# Patient Record
Sex: Male | Born: 1940 | Race: White | Hispanic: No | Marital: Married | Smoking: Former smoker
Health system: Southern US, Community
[De-identification: ages and names within clinical notes are randomized; demographics above are authoritative.]

## PROBLEM LIST (undated history)

## (undated) ENCOUNTER — Emergency Department (HOSPITAL_COMMUNITY): Payer: BC Managed Care – PPO | Source: Home / Self Care

## (undated) DIAGNOSIS — M549 Dorsalgia, unspecified: Secondary | ICD-10-CM

## (undated) DIAGNOSIS — M1990 Unspecified osteoarthritis, unspecified site: Secondary | ICD-10-CM

## (undated) DIAGNOSIS — H9319 Tinnitus, unspecified ear: Secondary | ICD-10-CM

## (undated) DIAGNOSIS — J9601 Acute respiratory failure with hypoxia: Secondary | ICD-10-CM

## (undated) DIAGNOSIS — Z87442 Personal history of urinary calculi: Secondary | ICD-10-CM

## (undated) DIAGNOSIS — M545 Low back pain, unspecified: Secondary | ICD-10-CM

## (undated) DIAGNOSIS — G2581 Restless legs syndrome: Secondary | ICD-10-CM

## (undated) DIAGNOSIS — M48061 Spinal stenosis, lumbar region without neurogenic claudication: Secondary | ICD-10-CM

## (undated) DIAGNOSIS — N62 Hypertrophy of breast: Secondary | ICD-10-CM

## (undated) DIAGNOSIS — I10 Essential (primary) hypertension: Secondary | ICD-10-CM

## (undated) DIAGNOSIS — E781 Pure hyperglyceridemia: Secondary | ICD-10-CM

## (undated) DIAGNOSIS — D649 Anemia, unspecified: Secondary | ICD-10-CM

## (undated) DIAGNOSIS — E1142 Type 2 diabetes mellitus with diabetic polyneuropathy: Secondary | ICD-10-CM

## (undated) DIAGNOSIS — M5416 Radiculopathy, lumbar region: Secondary | ICD-10-CM

## (undated) DIAGNOSIS — F329 Major depressive disorder, single episode, unspecified: Secondary | ICD-10-CM

## (undated) DIAGNOSIS — M5126 Other intervertebral disc displacement, lumbar region: Secondary | ICD-10-CM

## (undated) DIAGNOSIS — E785 Hyperlipidemia, unspecified: Secondary | ICD-10-CM

## (undated) DIAGNOSIS — C61 Malignant neoplasm of prostate: Secondary | ICD-10-CM

## (undated) DIAGNOSIS — G4700 Insomnia, unspecified: Secondary | ICD-10-CM

## (undated) DIAGNOSIS — E119 Type 2 diabetes mellitus without complications: Secondary | ICD-10-CM

## (undated) DIAGNOSIS — F32A Depression, unspecified: Secondary | ICD-10-CM

## (undated) HISTORY — DX: Hyperlipidemia, unspecified: E78.5

## (undated) HISTORY — DX: Malignant neoplasm of prostate: C61

## (undated) HISTORY — DX: Hypertrophy of breast: N62

## (undated) HISTORY — PX: GALLBLADDER SURGERY: SHX652

## (undated) HISTORY — PX: EYE SURGERY: SHX253

## (undated) HISTORY — DX: Spinal stenosis, lumbar region without neurogenic claudication: M48.061

## (undated) HISTORY — PX: CHOLECYSTECTOMY: SHX55

## (undated) HISTORY — PX: PROSTATE SURGERY: SHX751

## (undated) HISTORY — DX: Low back pain, unspecified: M54.50

## (undated) HISTORY — DX: Other intervertebral disc displacement, lumbar region: M51.26

## (undated) HISTORY — DX: Restless legs syndrome: G25.81

## (undated) HISTORY — DX: Essential (primary) hypertension: I10

## (undated) HISTORY — PX: MENISCUS REPAIR: SHX5179

## (undated) HISTORY — DX: Unspecified osteoarthritis, unspecified site: M19.90

## (undated) HISTORY — DX: Radiculopathy, lumbar region: M54.16

## (undated) HISTORY — PX: COLONOSCOPY: SHX174

## (undated) HISTORY — DX: Dorsalgia, unspecified: M54.9

## (undated) HISTORY — DX: Insomnia, unspecified: G47.00

## (undated) HISTORY — DX: Type 2 diabetes mellitus with diabetic polyneuropathy: E11.42

## (undated) HISTORY — DX: Tinnitus, unspecified ear: H93.19

## (undated) HISTORY — DX: Type 2 diabetes mellitus without complications: E11.9

## (undated) HISTORY — DX: Pure hyperglyceridemia: E78.1

---
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
## 1981-03-16 HISTORY — PX: HEMORRHOID SURGERY: SHX153

## 1999-12-18 ENCOUNTER — Other Ambulatory Visit: Admission: RE | Admit: 1999-12-18 | Discharge: 1999-12-18 | Payer: Self-pay | Admitting: Urology

## 2000-02-25 ENCOUNTER — Encounter: Admission: RE | Admit: 2000-02-25 | Discharge: 2000-05-25 | Payer: Self-pay | Admitting: Radiation Oncology

## 2000-03-19 ENCOUNTER — Ambulatory Visit (HOSPITAL_COMMUNITY): Admission: RE | Admit: 2000-03-19 | Discharge: 2000-03-19 | Payer: Self-pay | Admitting: Radiation Oncology

## 2000-03-19 ENCOUNTER — Encounter: Payer: Self-pay | Admitting: Radiation Oncology

## 2000-04-15 ENCOUNTER — Ambulatory Visit (HOSPITAL_BASED_OUTPATIENT_CLINIC_OR_DEPARTMENT_OTHER): Admission: RE | Admit: 2000-04-15 | Discharge: 2000-04-15 | Payer: Self-pay | Admitting: Urology

## 2000-04-15 ENCOUNTER — Encounter: Payer: Self-pay | Admitting: Urology

## 2000-04-16 ENCOUNTER — Encounter: Admission: RE | Admit: 2000-04-16 | Discharge: 2000-07-15 | Payer: Self-pay | Admitting: Radiation Oncology

## 2001-03-16 DIAGNOSIS — C61 Malignant neoplasm of prostate: Secondary | ICD-10-CM

## 2001-03-16 HISTORY — DX: Malignant neoplasm of prostate: C61

## 2003-06-04 ENCOUNTER — Encounter: Admission: RE | Admit: 2003-06-04 | Discharge: 2003-06-04 | Payer: Self-pay | Admitting: Internal Medicine

## 2003-06-06 ENCOUNTER — Encounter: Admission: RE | Admit: 2003-06-06 | Discharge: 2003-06-06 | Payer: Self-pay | Admitting: Internal Medicine

## 2003-06-06 IMAGING — CR DG CHEST 2V
2 series · 2 of 2 positions shown · non-contrast
Comparison: none

CLINICAL DATA: Patient has cough.  Gynecomastia.  
 CHEST, TWO VIEWS
 PA and lateral views reveal the heart size to be normal with calcification of the aortic arch.  There is thought to be an area of scarring in the left costophrenic angle.  No active lung findings.  Degenerative changes are noted of the lower thoracic spine. 
 IMPRESSION
 No active disease.

[view not recorded (1 of 2)]
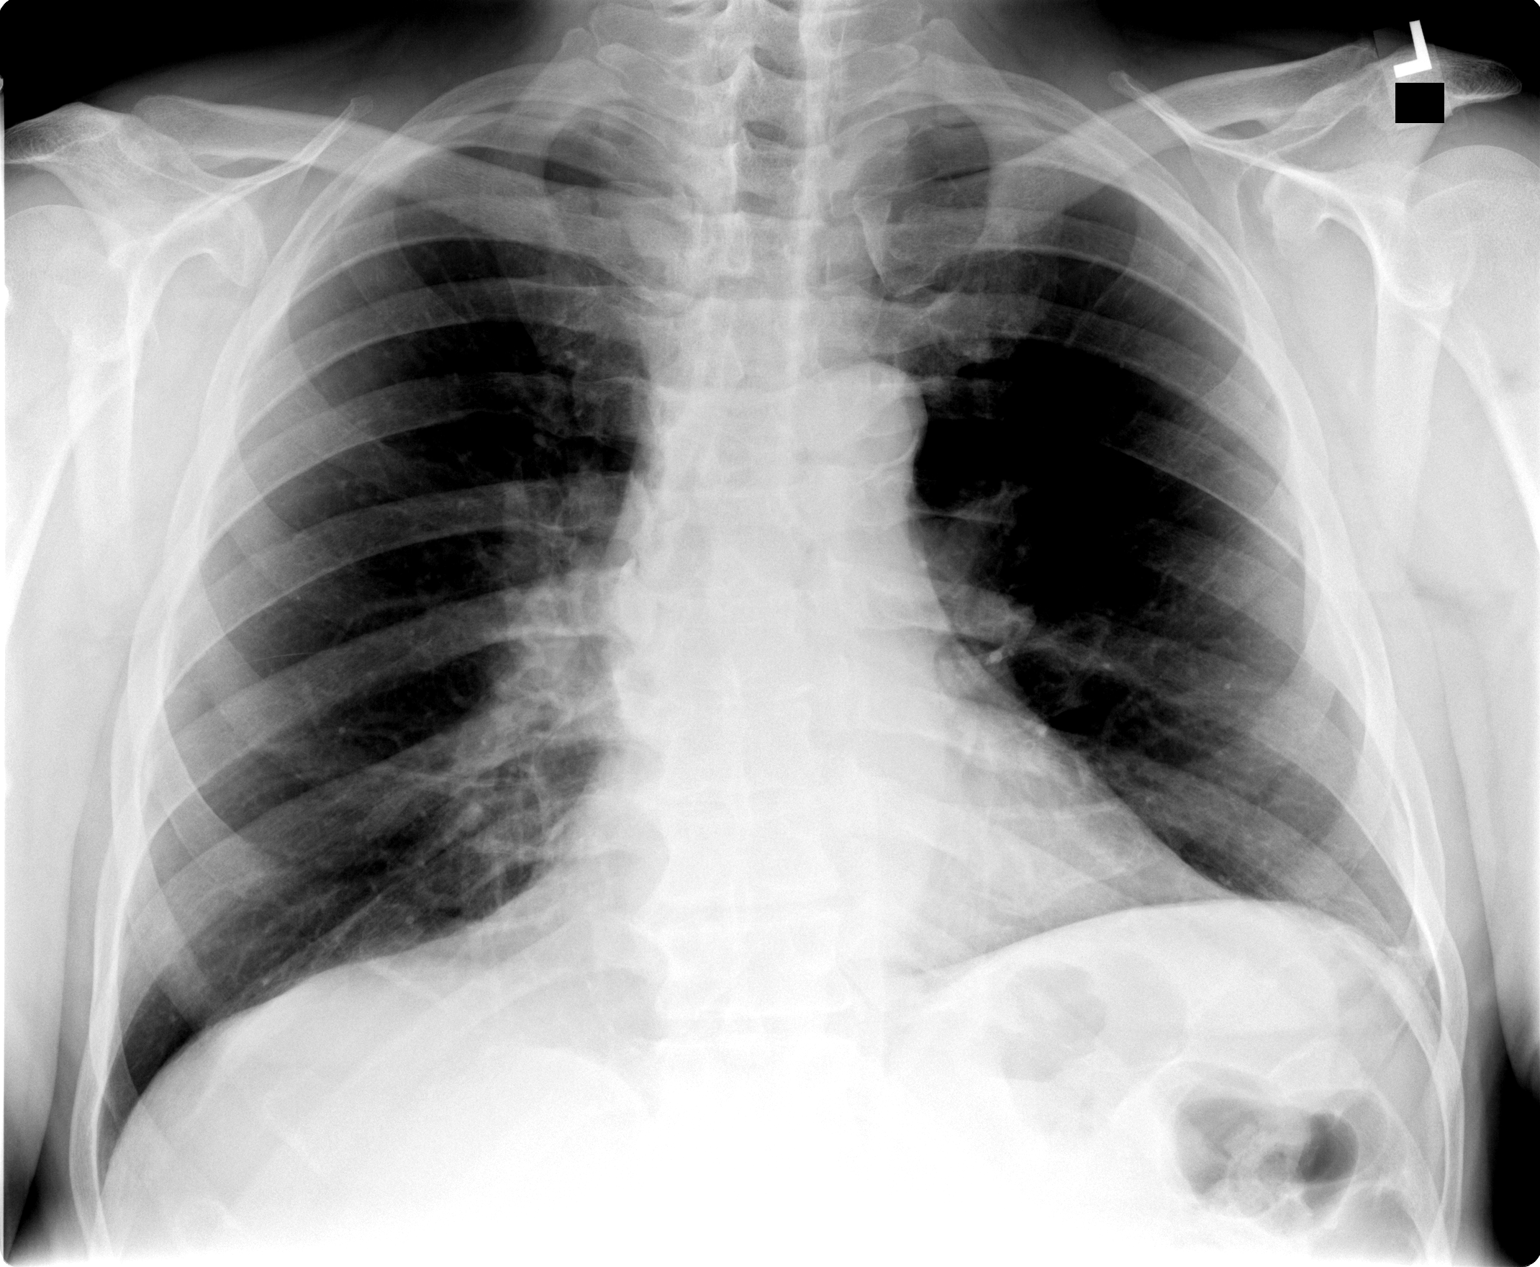

[view not recorded (2 of 2)]
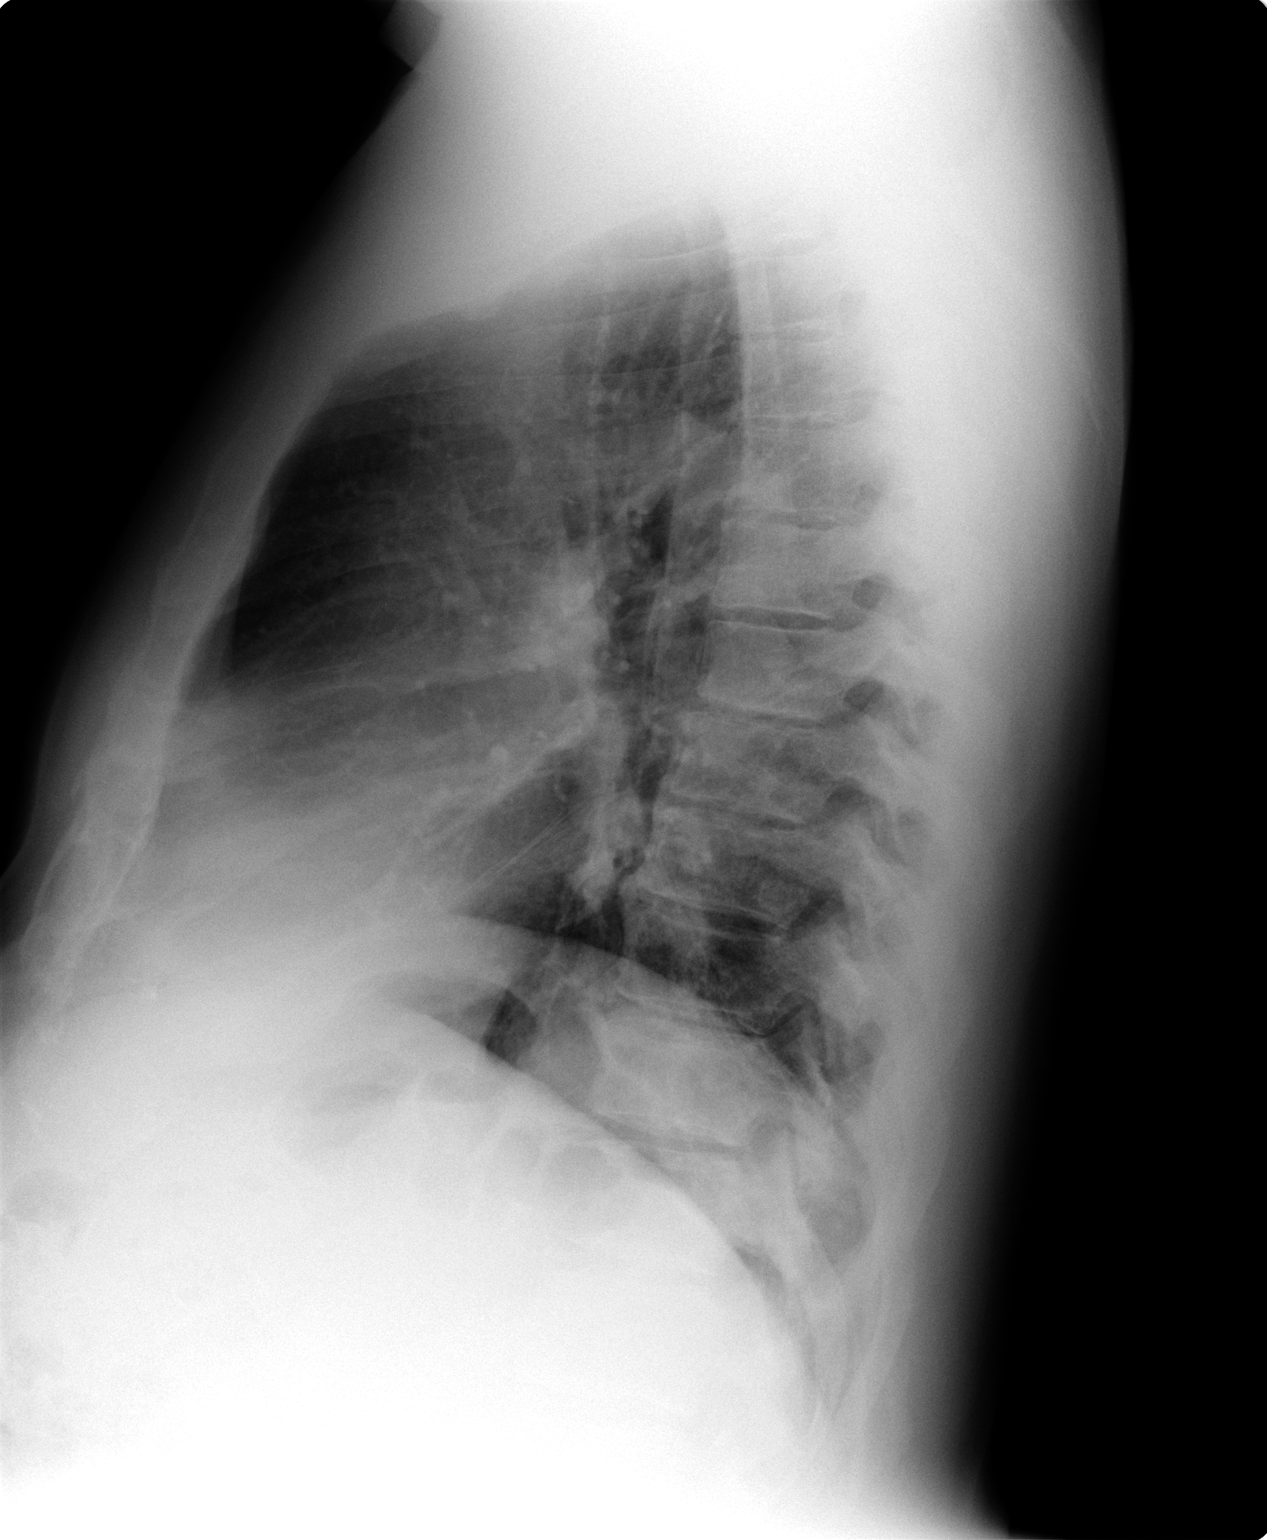

[2 of 2 positions shown; findings below may reference images not displayed]

## 2005-05-21 ENCOUNTER — Encounter: Admission: RE | Admit: 2005-05-21 | Discharge: 2005-05-21 | Payer: Self-pay | Admitting: Internal Medicine

## 2005-05-21 IMAGING — US US EXTREM LOW VENOUS*R*
1 series · 14 of 24 positions shown · non-contrast
Comparison: None.

CLINICAL DATA: Right lower extremity pain and swelling. 
 RIGHT LOWER EXTREMITY VENOUS DOPPLER ULTRASOUND:
TECHNIQUE: Gray-scale sonography with compression, as well as color and duplex Doppler ultrasound, were performed to evaluate the deep venous system from the level of the common femoral vein through the popliteal and proximal calf veins.

[Series 1: unknown · 14 of 28 slices shown]
[im 1/28]
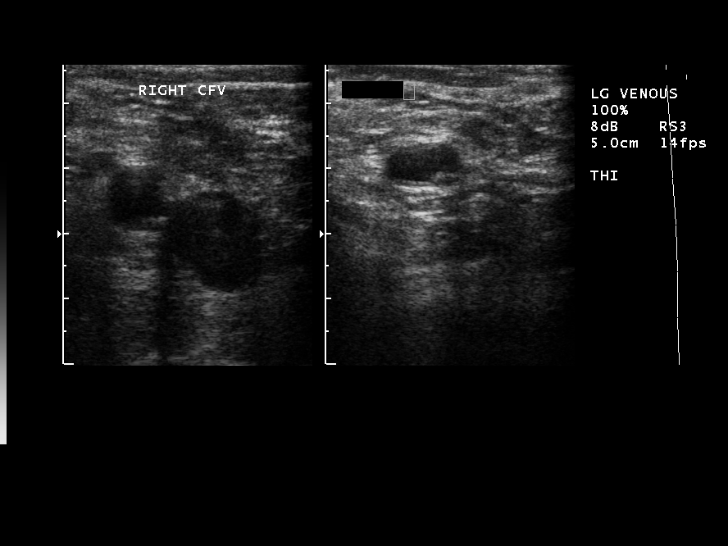
[im 3/28]
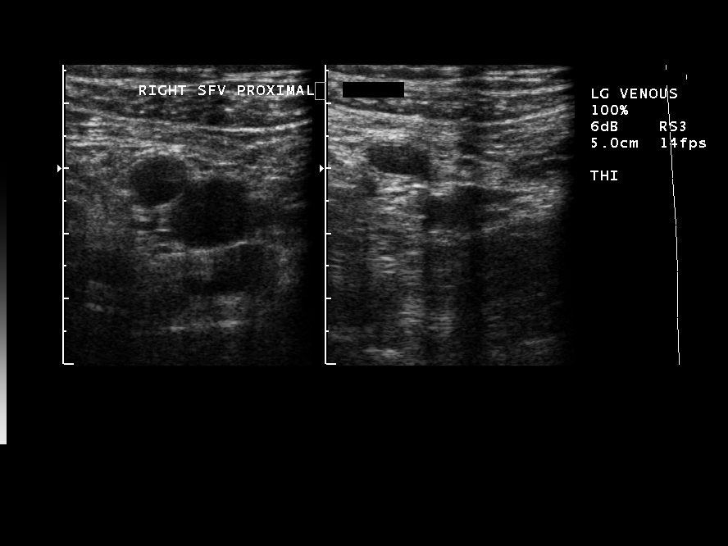
[im 5/28]
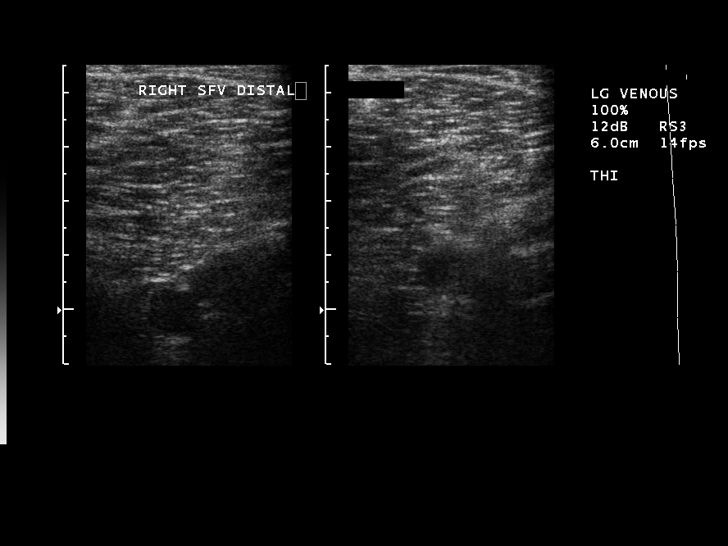
[im 8/28]
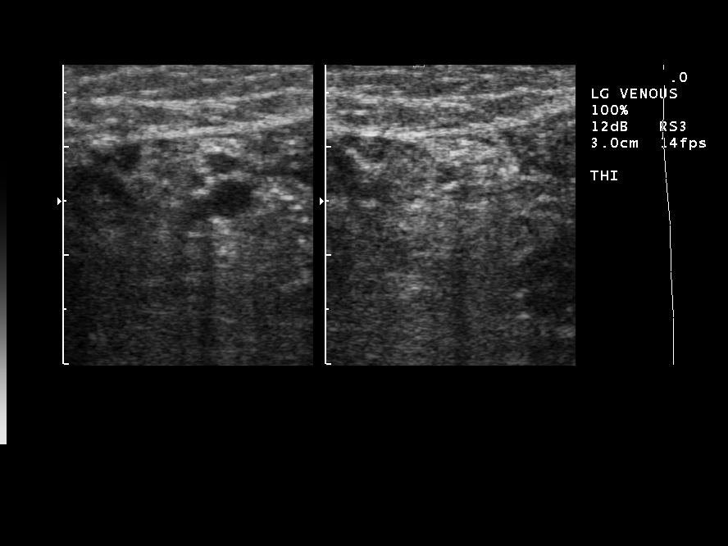
[im 9/28]
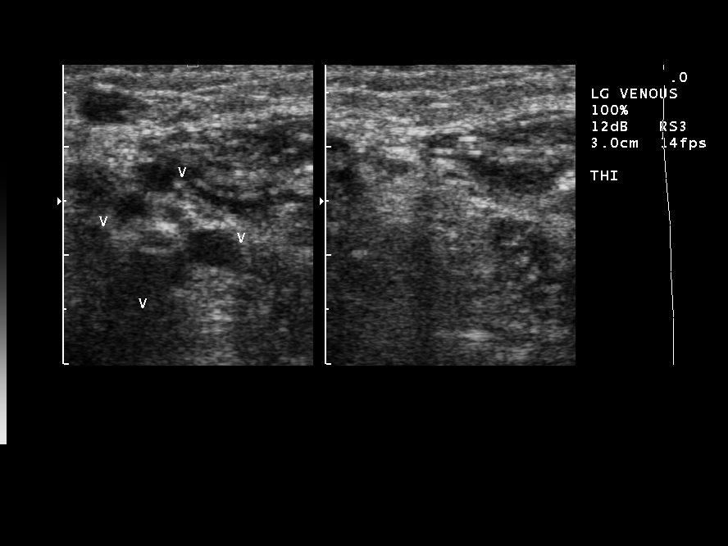
[im 11/28]
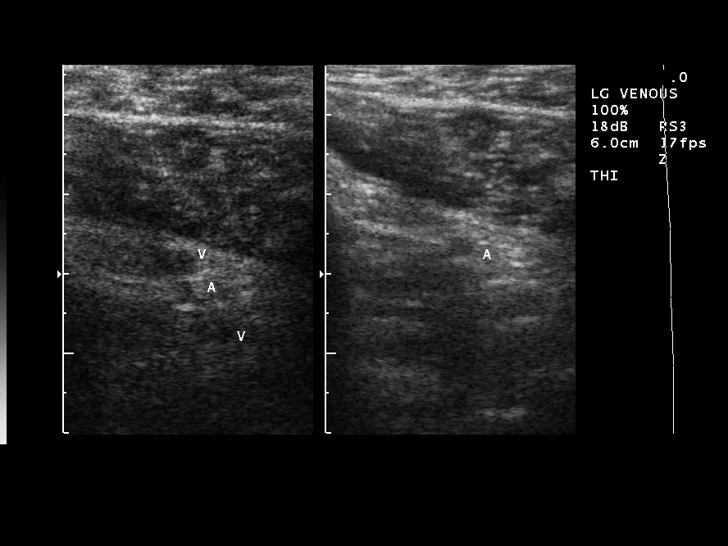
[im 13/28]
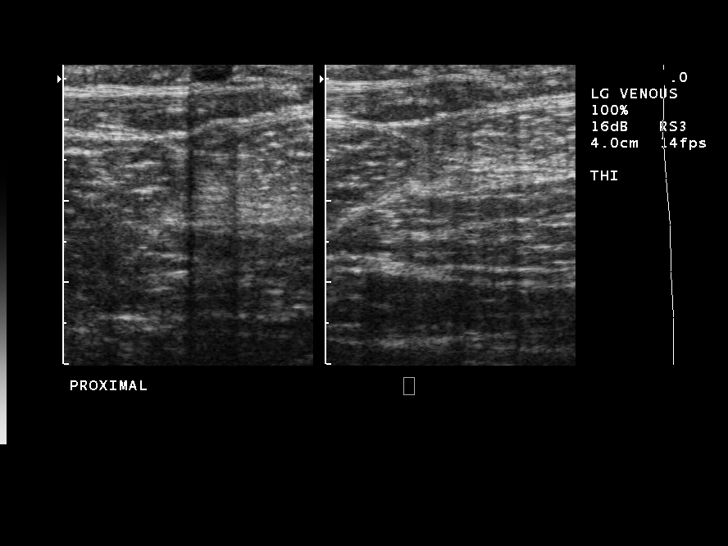
[im 15/28]
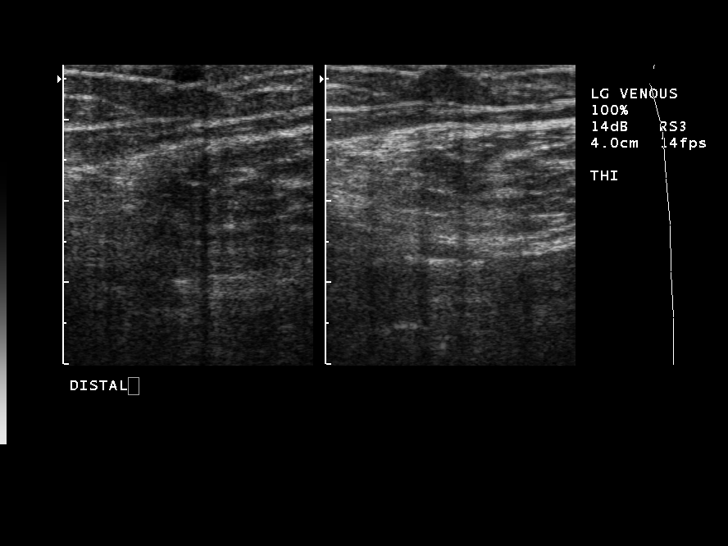
[im 17/28]
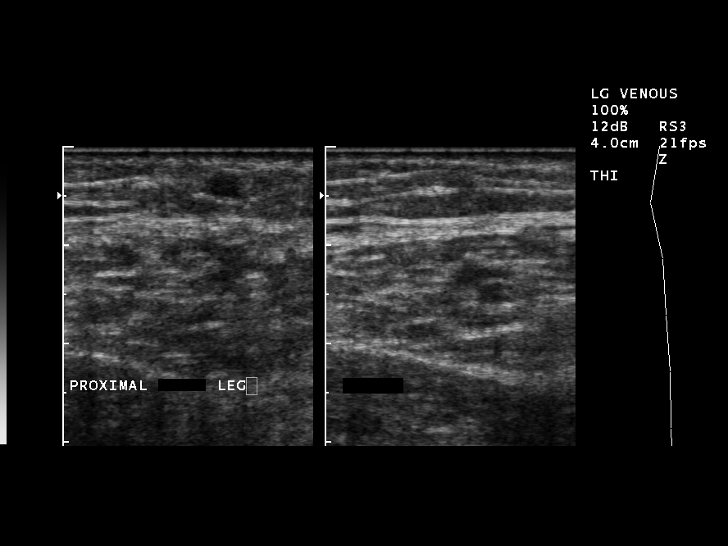
[im 19/28]
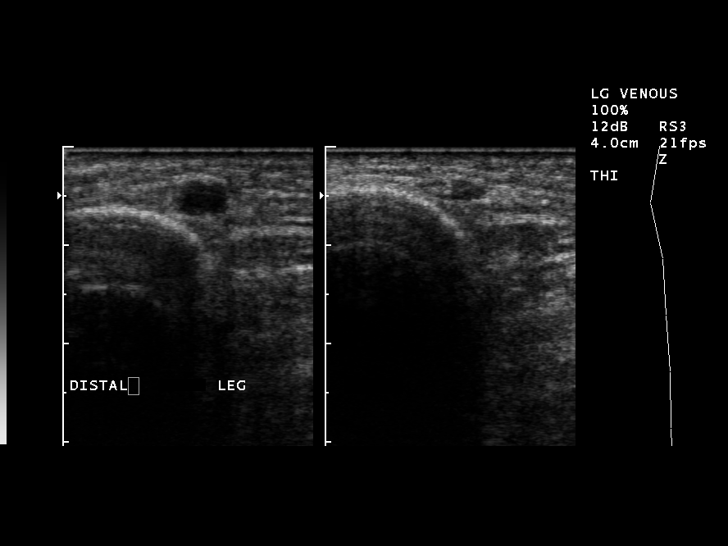
[im 22/28]
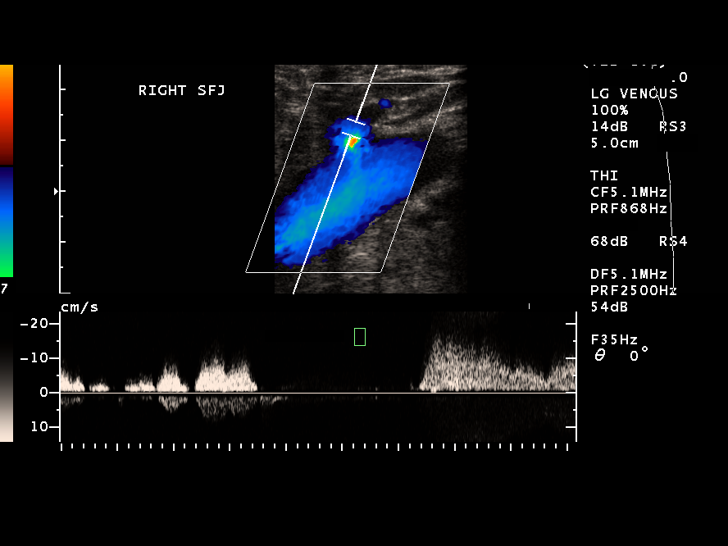
[im 23/28]
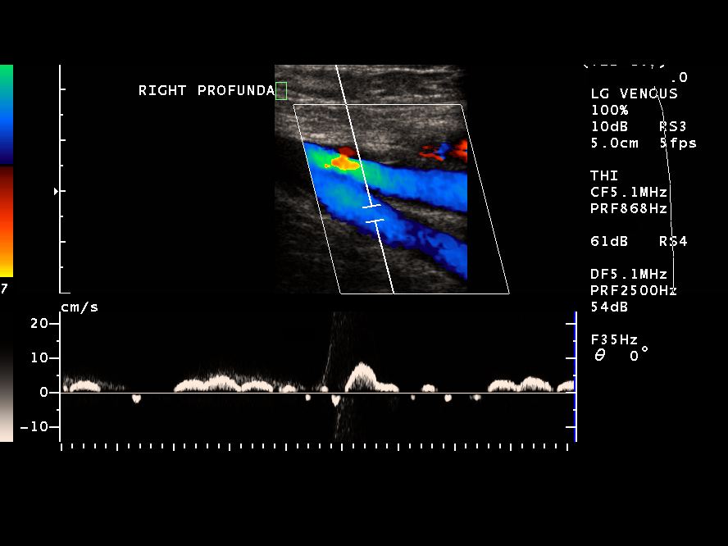
[im 25/28]
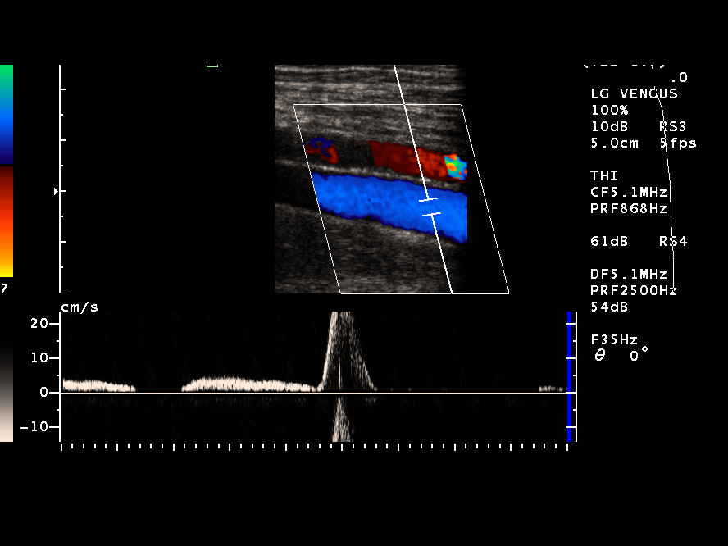
[im 28/28]
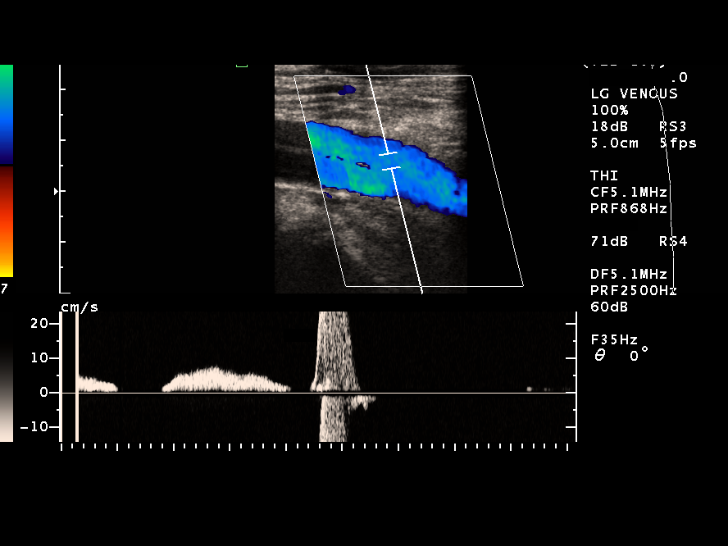

[14 of 24 positions shown; findings below may reference images not displayed]

FINDINGS: There is patent flow in the common femoral, superficial femoral, and popliteal veins.  These vessels are completely compressible and demonstrate increased flow with augmentation.  No superficial thrombophlebitis is seen.   Posterior tibial veins are identified in the proximal calf and are compressible.
IMPRESSION: No evidence for deep venous thrombosis in the right lower extremity.

## 2005-08-30 ENCOUNTER — Ambulatory Visit (HOSPITAL_COMMUNITY): Admission: RE | Admit: 2005-08-30 | Discharge: 2005-08-30 | Payer: Self-pay | Admitting: Family Medicine

## 2005-08-30 IMAGING — US US ABDOMEN COMPLETE
1 series · 13 of 25 positions shown · non-contrast
Comparison: none

CLINICAL DATA: Abdominal pain. 
 ABDOMEN ULTRASOUND:
TECHNIQUE: Complete abdominal ultrasound examination was performed including evaluation of the liver, gallbladder, bile ducts, pancreas, kidneys, spleen, IVC, and abdominal aorta.

[Series 1: abdomen · 0.33mm/px · 13 of 86 slices shown]
[im 1/86]
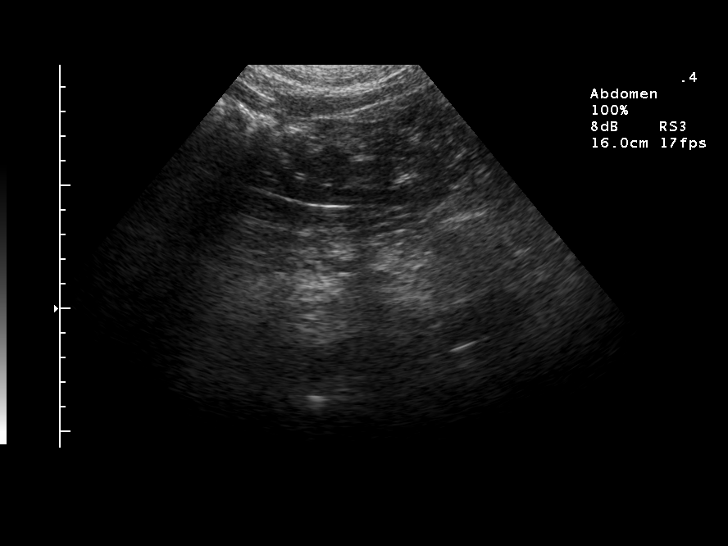
[im 8/86]
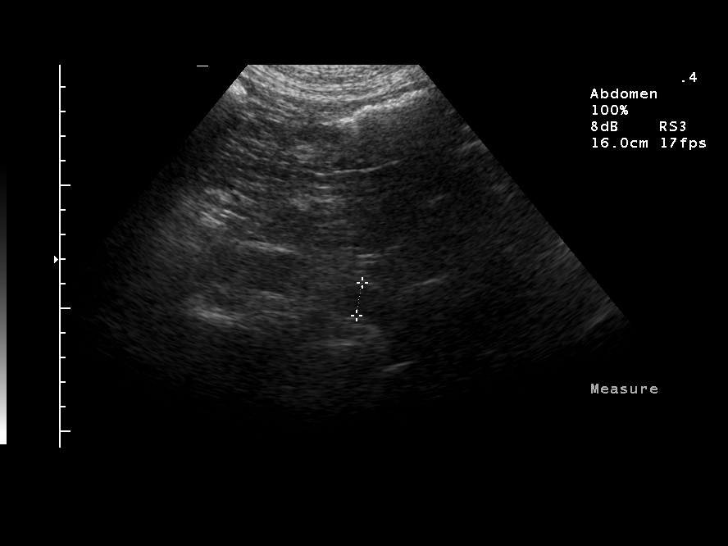
[im 15/86]
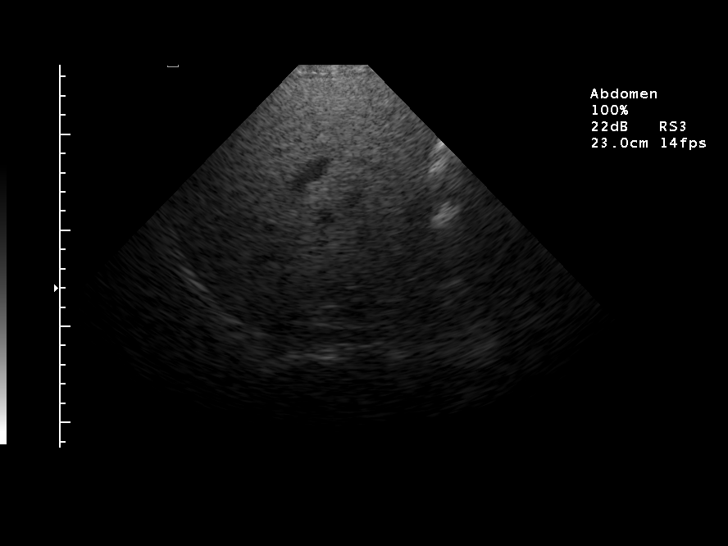
[im 22/86]
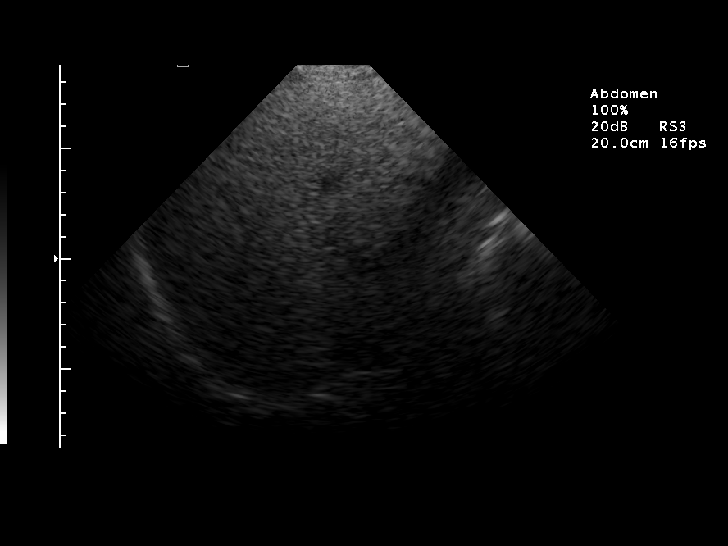
[im 29/86]
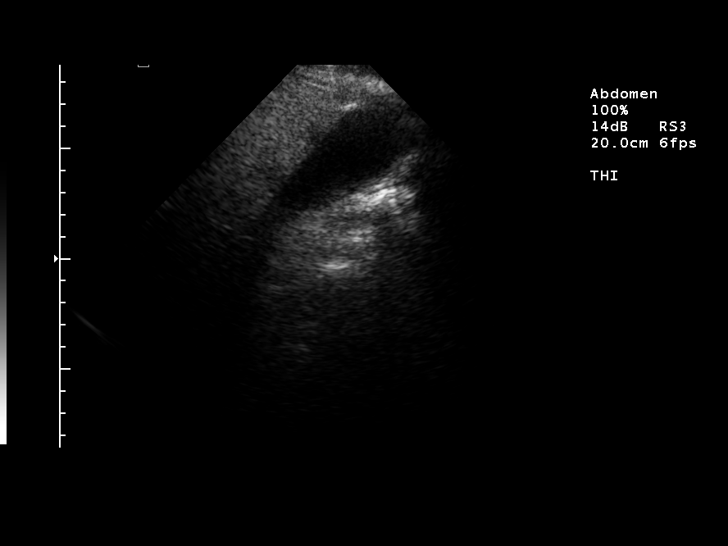
[im 36/86]
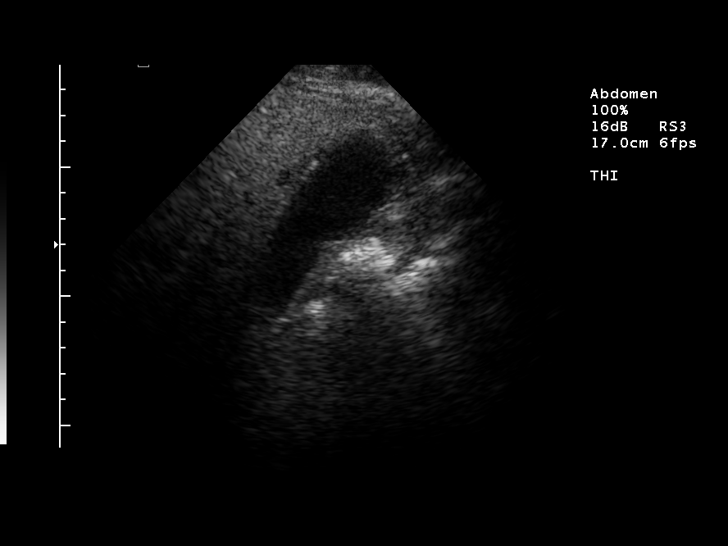
[im 43/86]
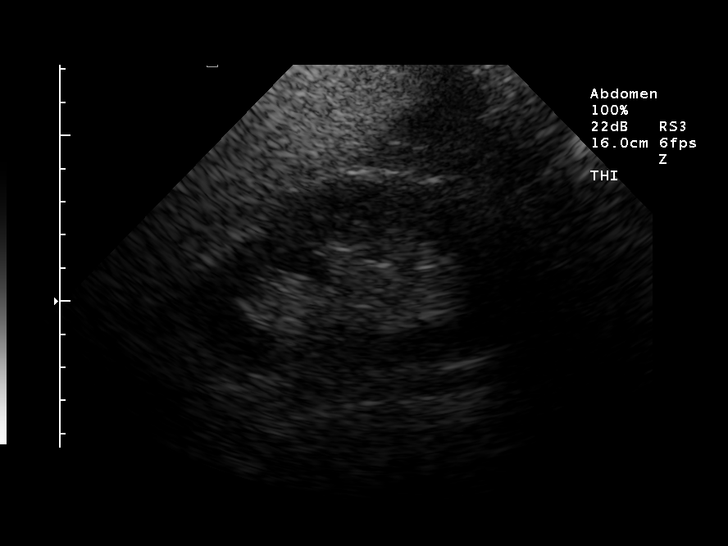
[im 50/86]
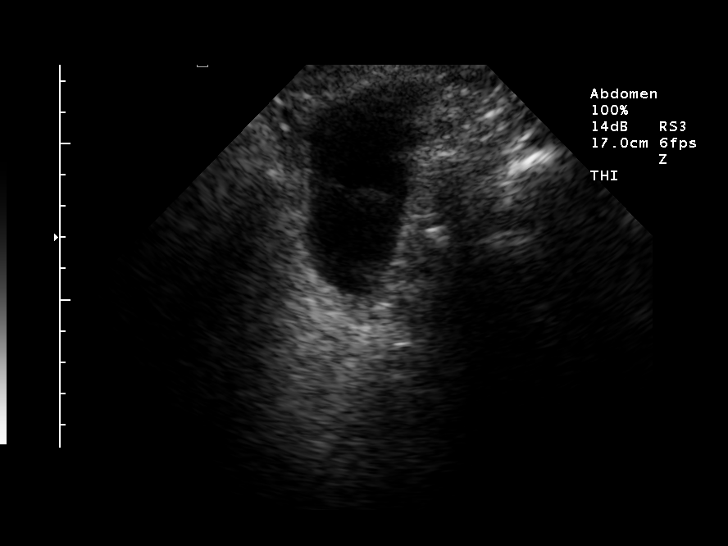
[im 57/86]
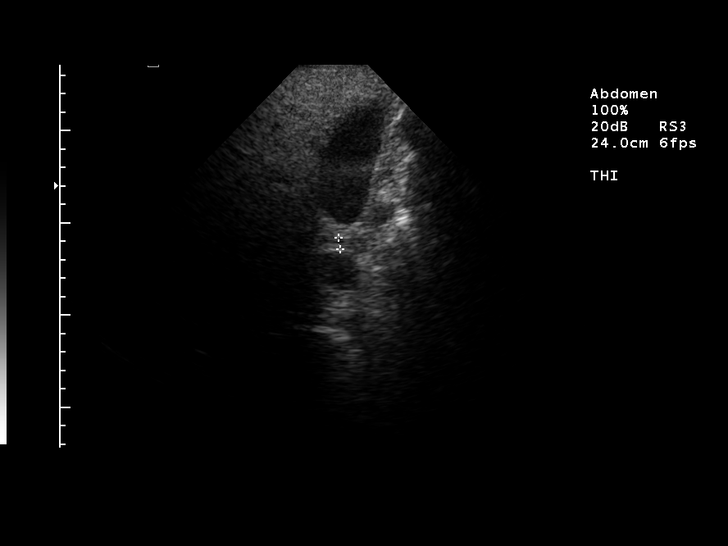
[im 64/86]
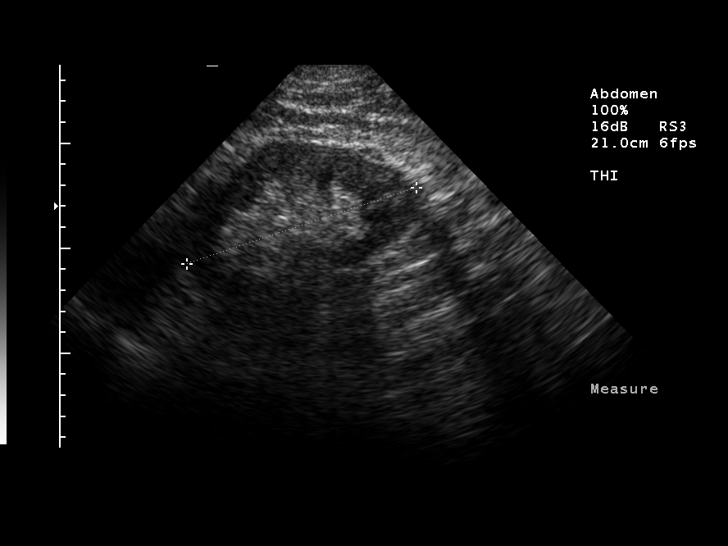
[im 71/86]
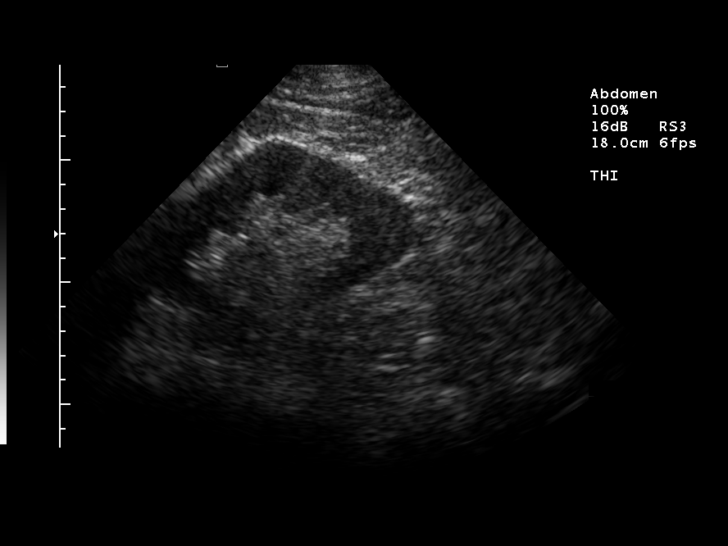
[im 78/86]
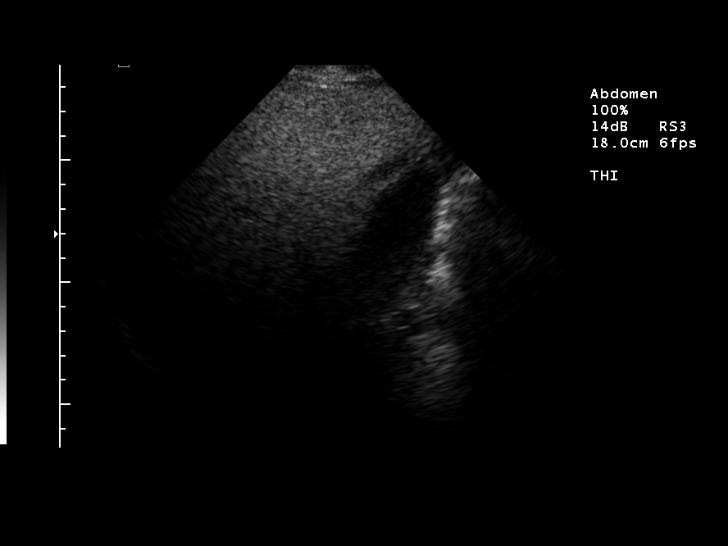
[im 86/86]
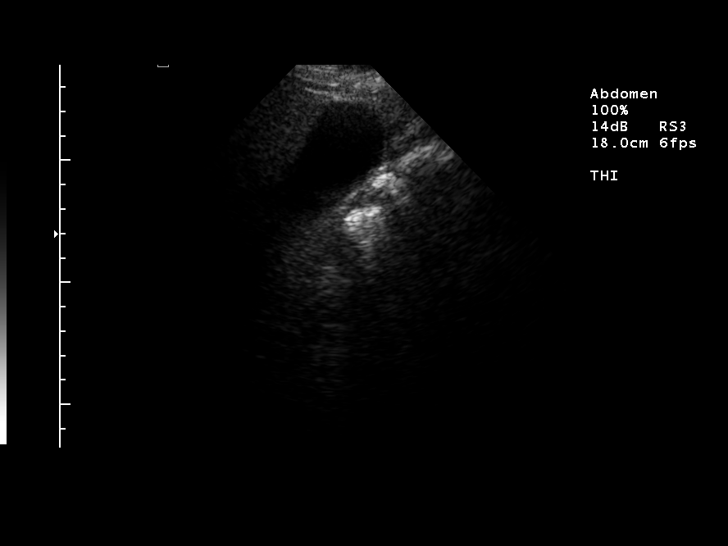

[13 of 25 positions shown; findings below may reference images not displayed]

FINDINGS: Diffusely hyperechoic echotexture of the liver is seen, consistent with diffuse fatty infiltration. This limits evaluation.  No focal liver lesions are identified.  The gallbladder is incompletely distended.  Some layering echogenic sludge is seen within the gallbladder but no discrete gallstones are identified.  There is no evidence of abnormal gallbladder wall thickening or pericholecystic fluid.  Focal fatty sparring of the hepatic parenchyma is seen immediately adjacent to the gallbladder.
 There is no definite evidence of biliary ductal dilatation with the common bile duct measuring approximately 6 mm, although evaluation is limited due to diffuse fatty infiltration.  The IVC and pancreas were not well visualized due to overlying bowel gas.
 There is no evidence of splenomegaly.  Both kidneys are normal in size and appearance and there is no evidence of hydronephrosis.  The visualized portion of the abdominal aorta is nondilated. There is no evidence of ascites.
IMPRESSION: 1.  Gallbladder sludge is noted, but no discrete gallstones are identified.
 2.  Marked diffuse fatty infiltration of the liver which somewhat limits evaluation. No definite evidence of biliary dilatation or other acute findings.

## 2005-09-21 ENCOUNTER — Ambulatory Visit (HOSPITAL_COMMUNITY): Admission: RE | Admit: 2005-09-21 | Discharge: 2005-09-21 | Payer: Self-pay | Admitting: General Surgery

## 2005-09-21 IMAGING — CR DG CHEST 2V
3 series · 3 of 3 positions shown · non-contrast
Comparison: [DATE].

CLINICAL DATA: Preop for gallbladder surgery.  The patient has a history of smoking. 
 CHEST ? 2 VIEW:

[view not recorded (1 of 3)]
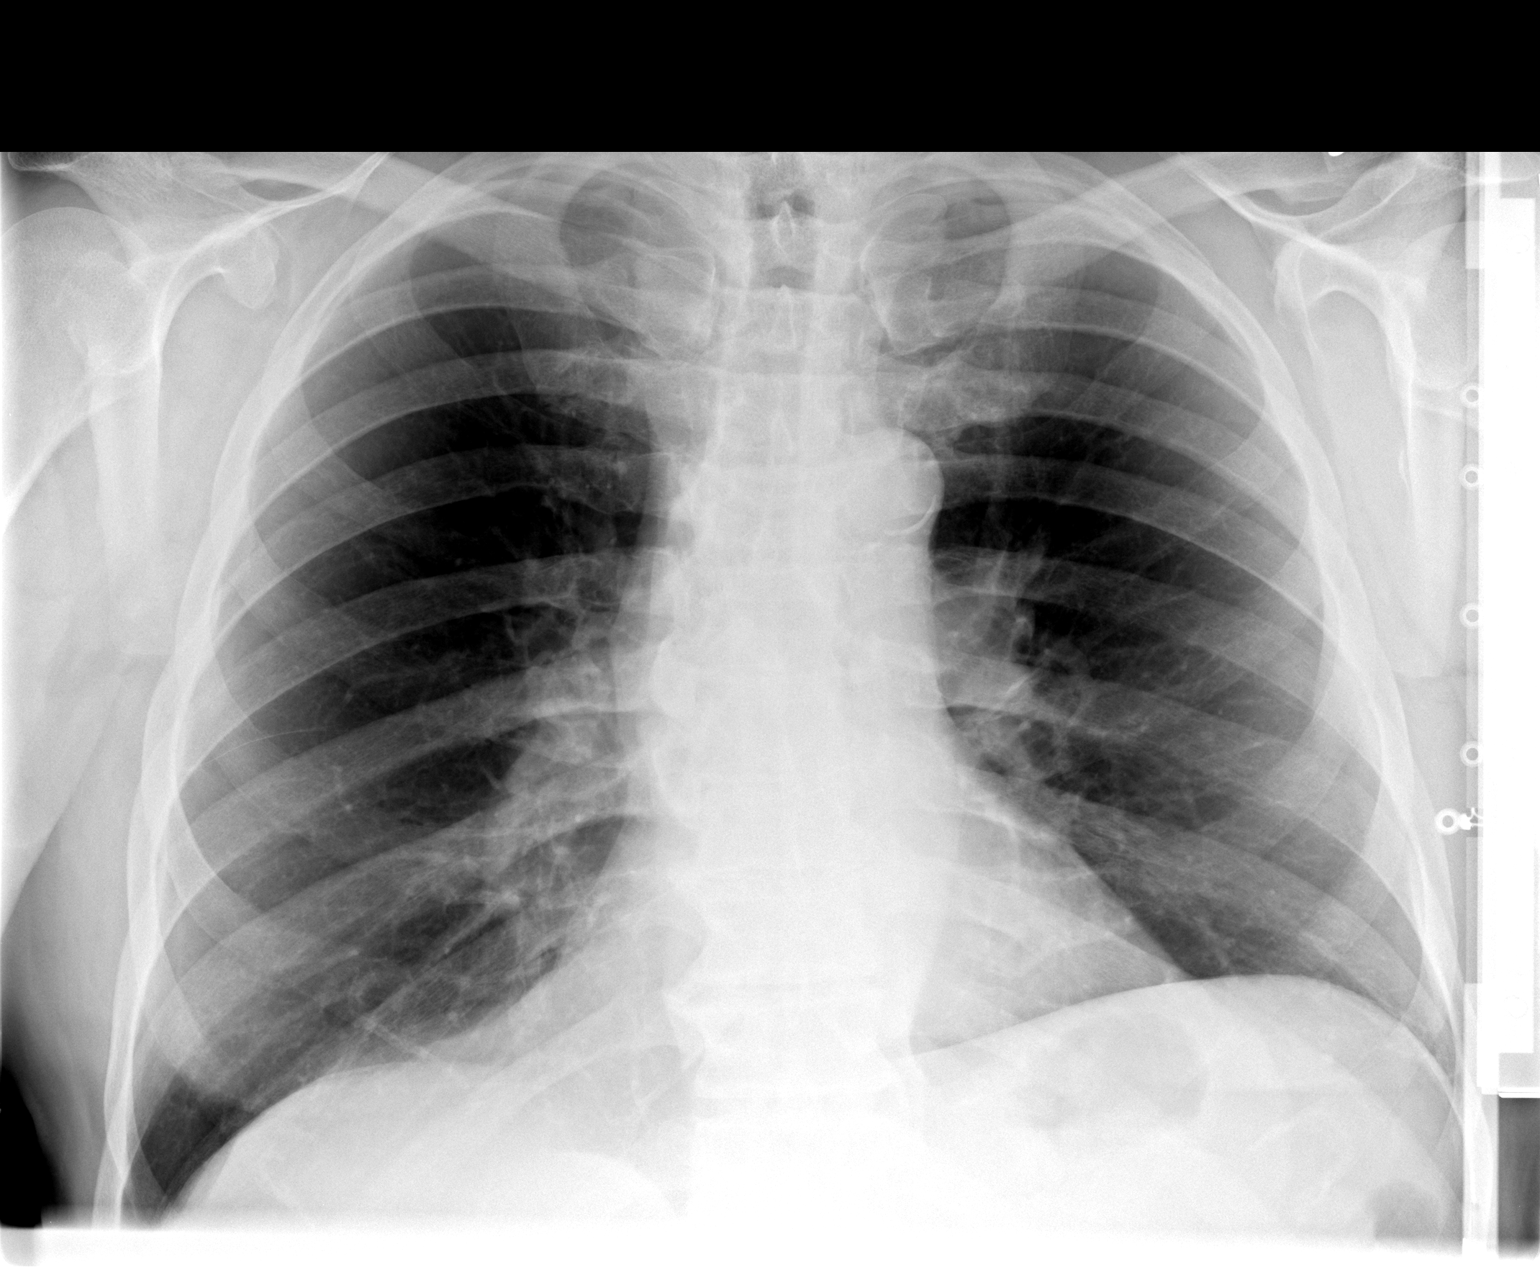

[view not recorded (2 of 3)]
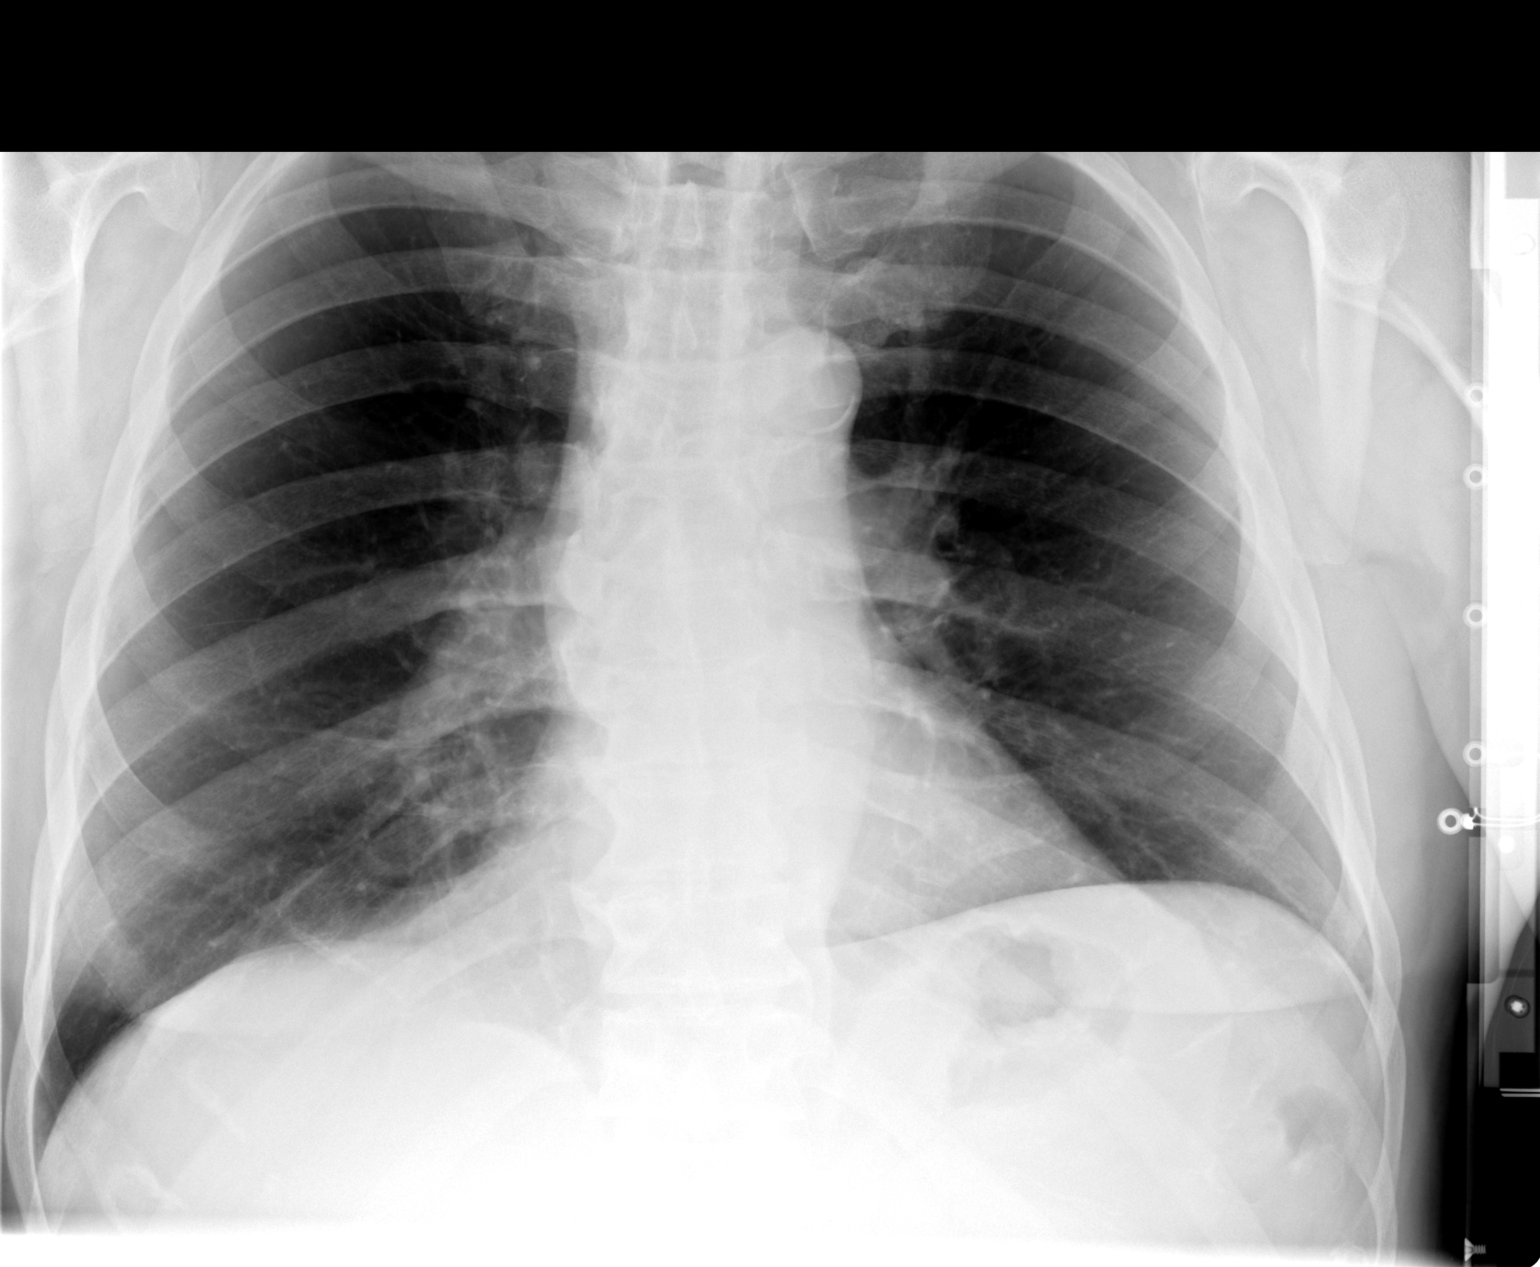

[view not recorded (3 of 3)]
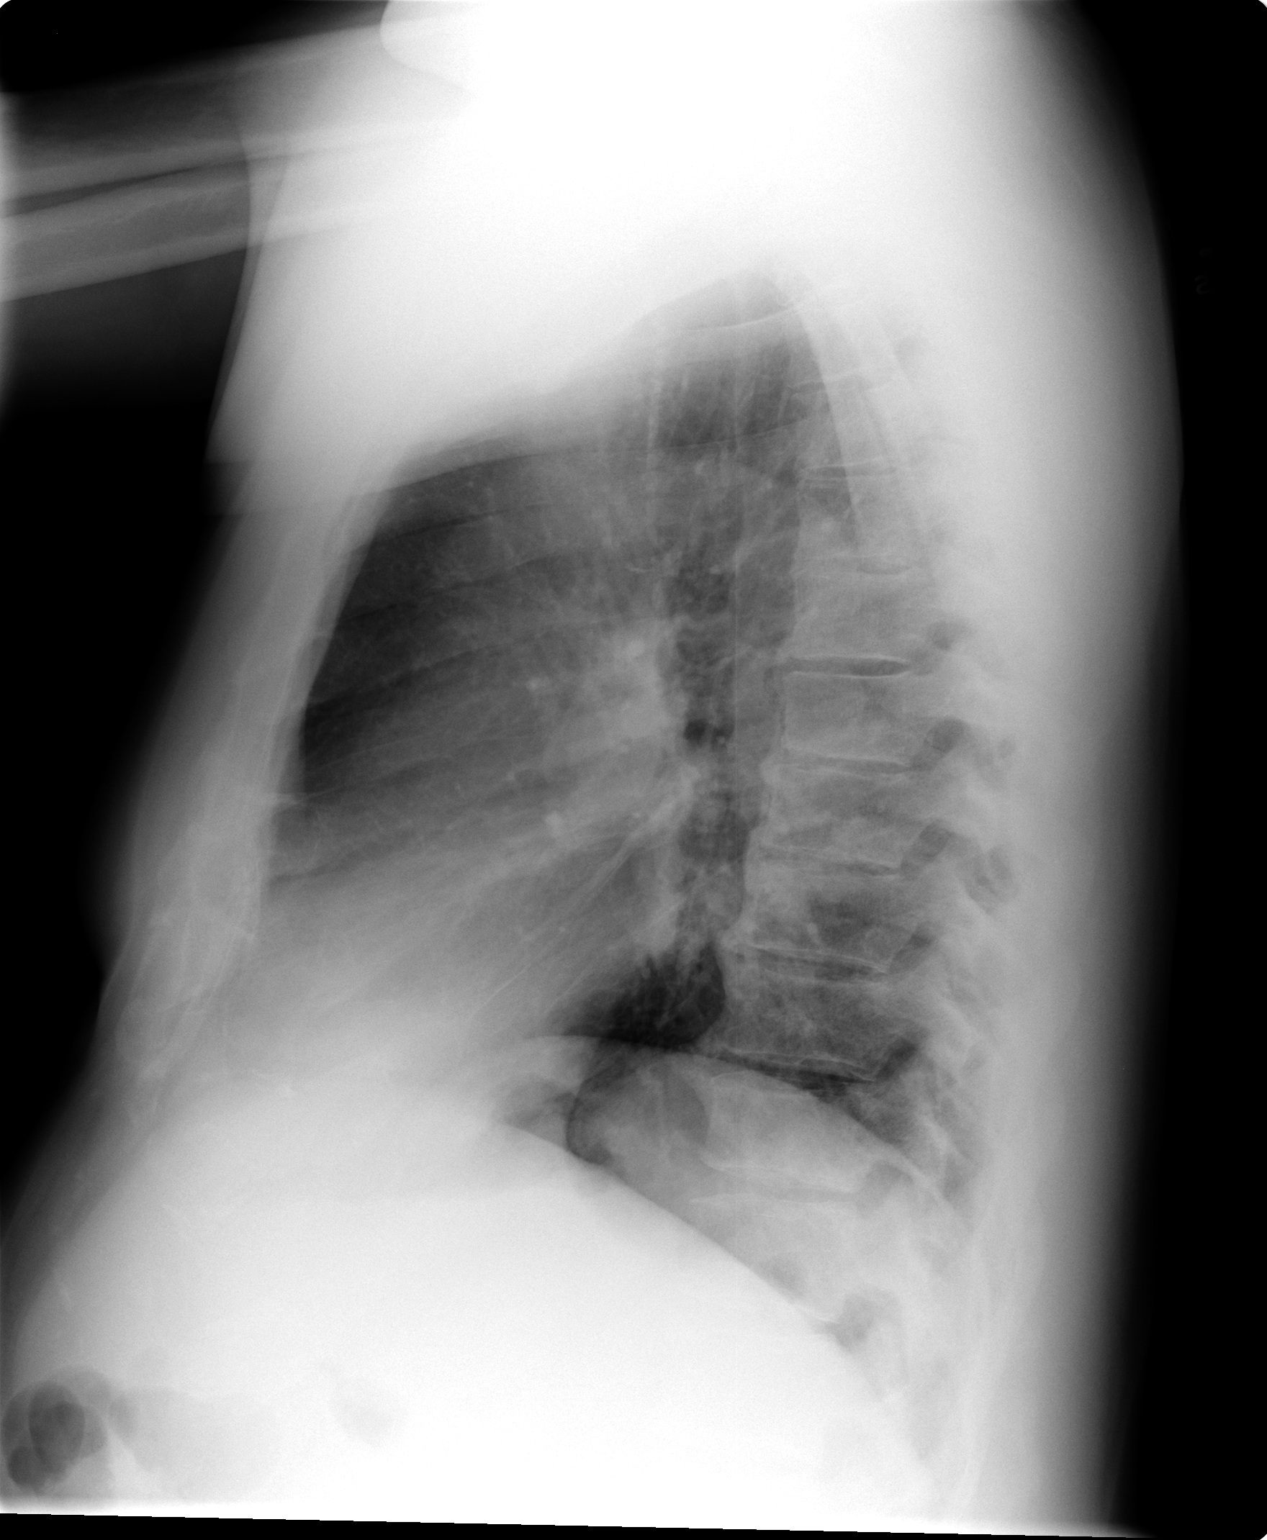

[3 of 3 positions shown; findings below may reference images not displayed]

FINDINGS: PA and lateral views reveal the heart size to be normal.  The aorta is mildly dilated with calcification.  Diffuse spurring is again noted of the thoracic spine.
IMPRESSION: No change.  No active disease.

## 2005-09-21 IMAGING — RF DG CHOLANGIOGRAM OPERATIVE
1 series · 4 of 4 positions shown · non-contrast
Comparison: none

CLINICAL DATA: Cholecystectomy.
 OPERATIVE CHOLANGIOGRAM:

[Series 1: run · 4 of 74 frames shown]
[frame 12/74]
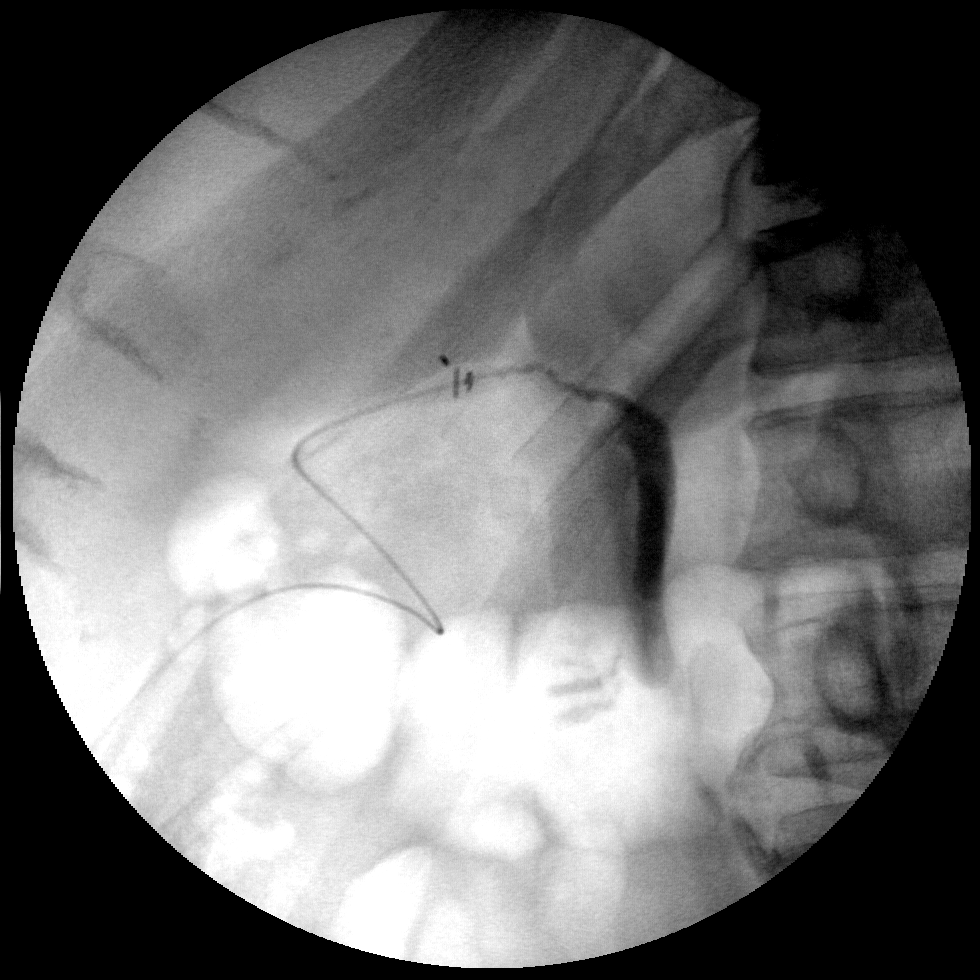
[frame 38/74]
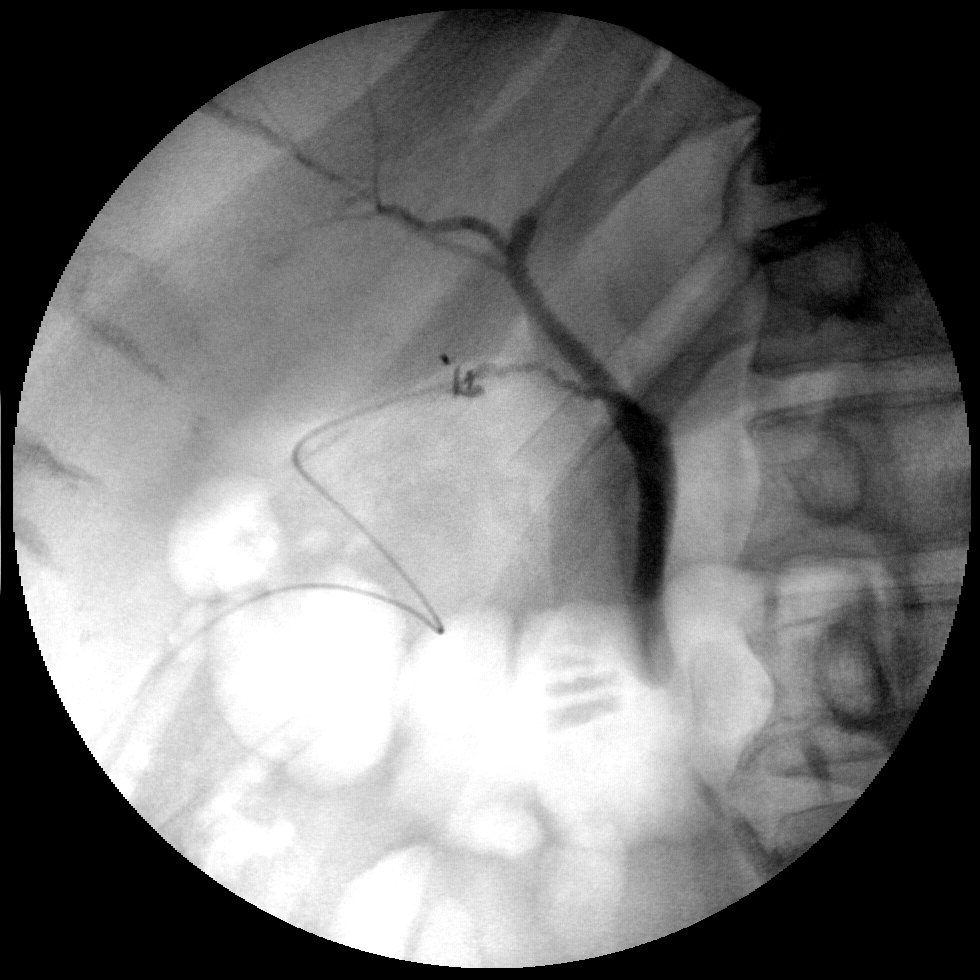
[frame 63/74]
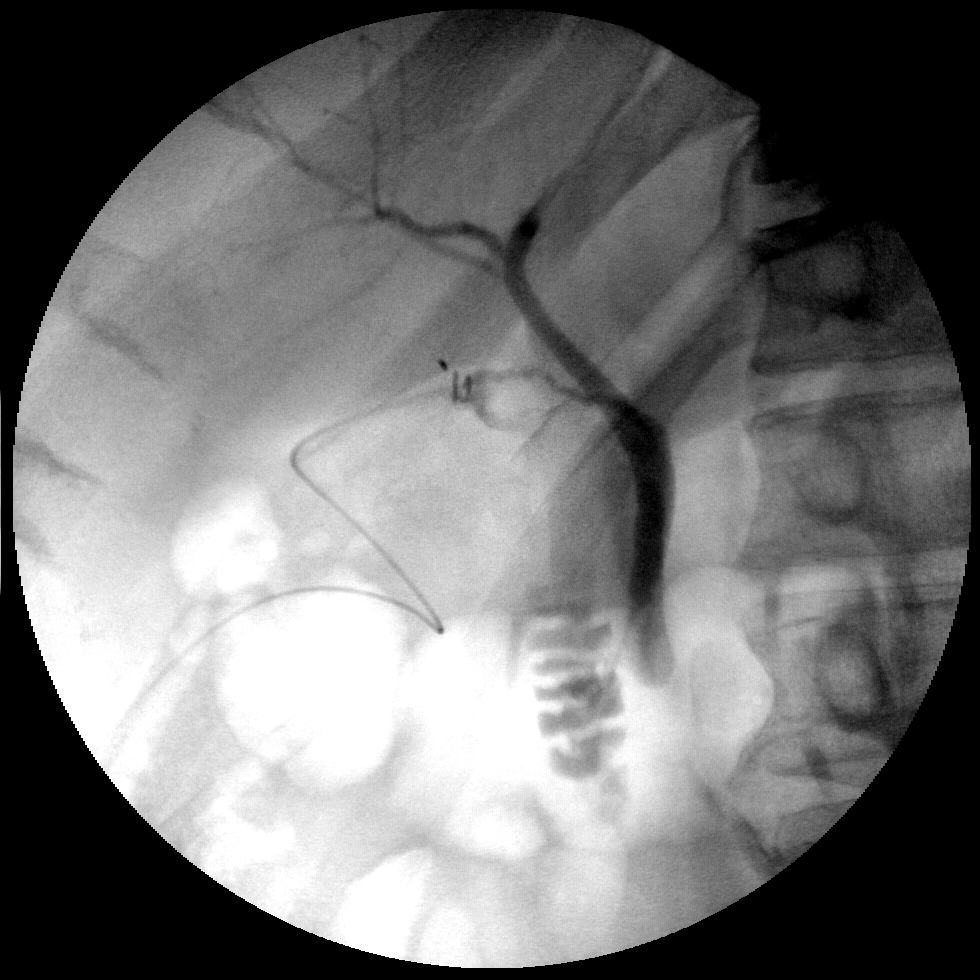
[frame 74/74]
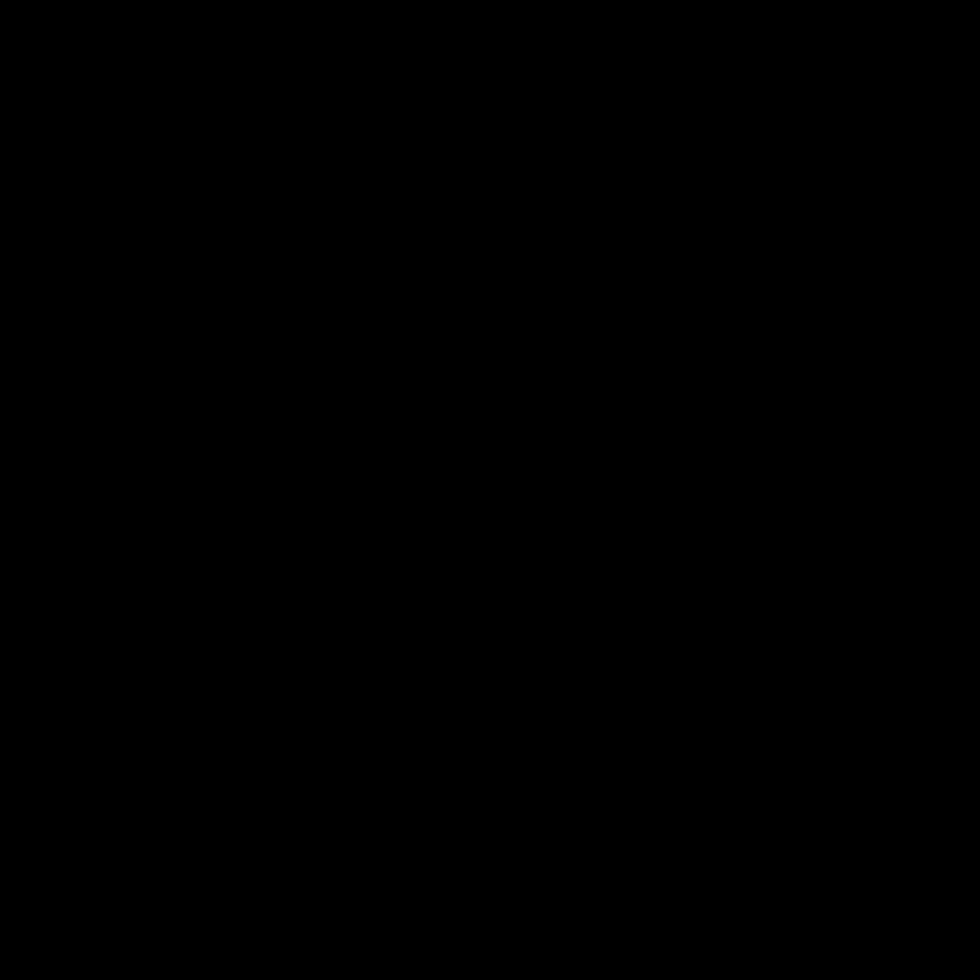

[4 of 4 positions shown; findings below may reference images not displayed]

FINDINGS: Cystic duct is injected with contrast.  The bile ducts are nondilated.  There is no obstruction of the common bile duct.  There is no leak or stone.
IMPRESSION: Negative.

## 2006-02-19 ENCOUNTER — Ambulatory Visit (HOSPITAL_COMMUNITY): Admission: RE | Admit: 2006-02-19 | Discharge: 2006-02-19 | Payer: Self-pay | Admitting: Gastroenterology

## 2006-04-02 ENCOUNTER — Encounter: Admission: RE | Admit: 2006-04-02 | Discharge: 2006-04-02 | Payer: Self-pay | Admitting: *Deleted

## 2006-04-02 IMAGING — CR DG CHEST 2V
2 series · 2 of 2 positions shown · non-contrast
Comparison: [DATE].

CLINICAL DATA: Hypertension and bronchial breath sounds at right lung base. 
 TWO VIEW CHEST:

[w chest pa]
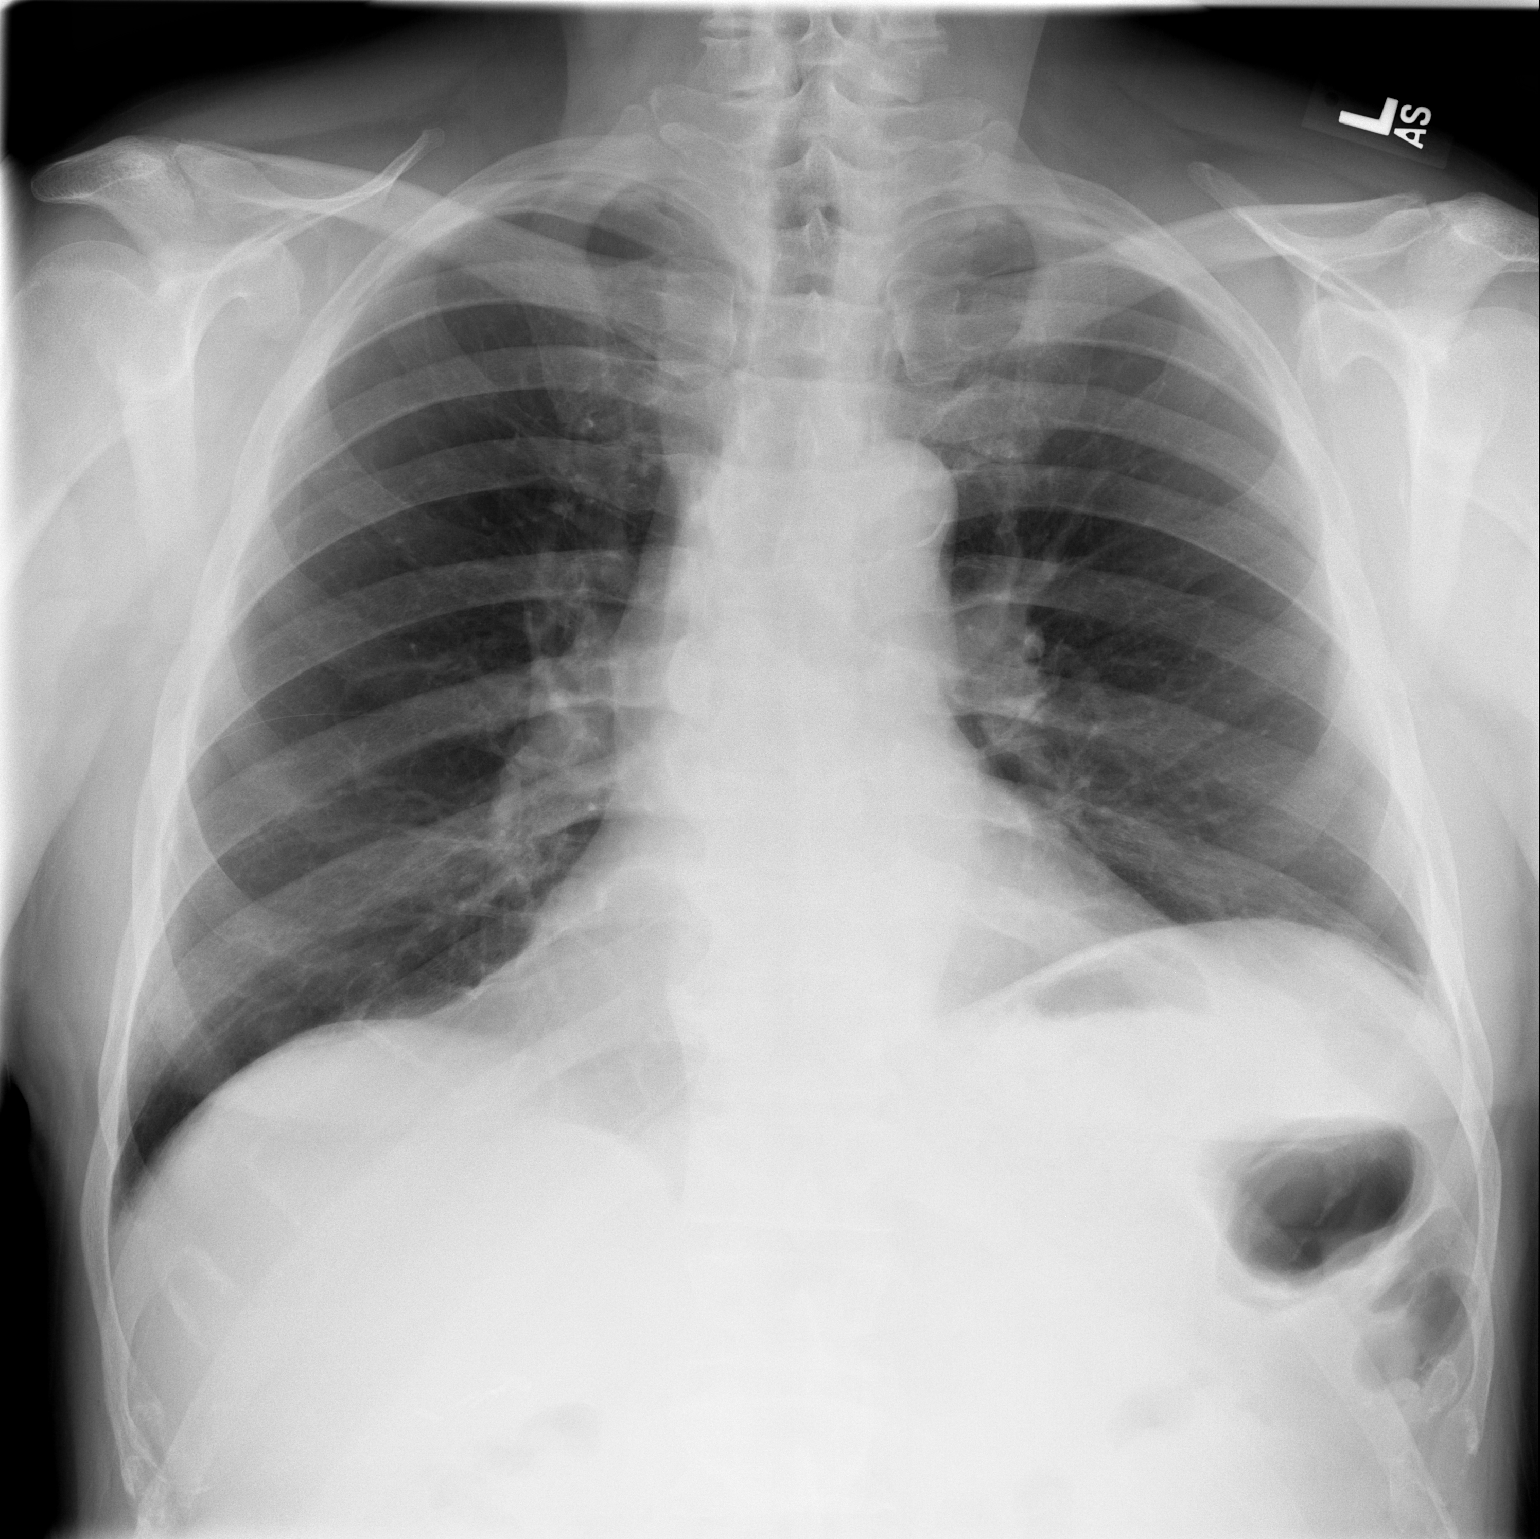

[w chest lat]
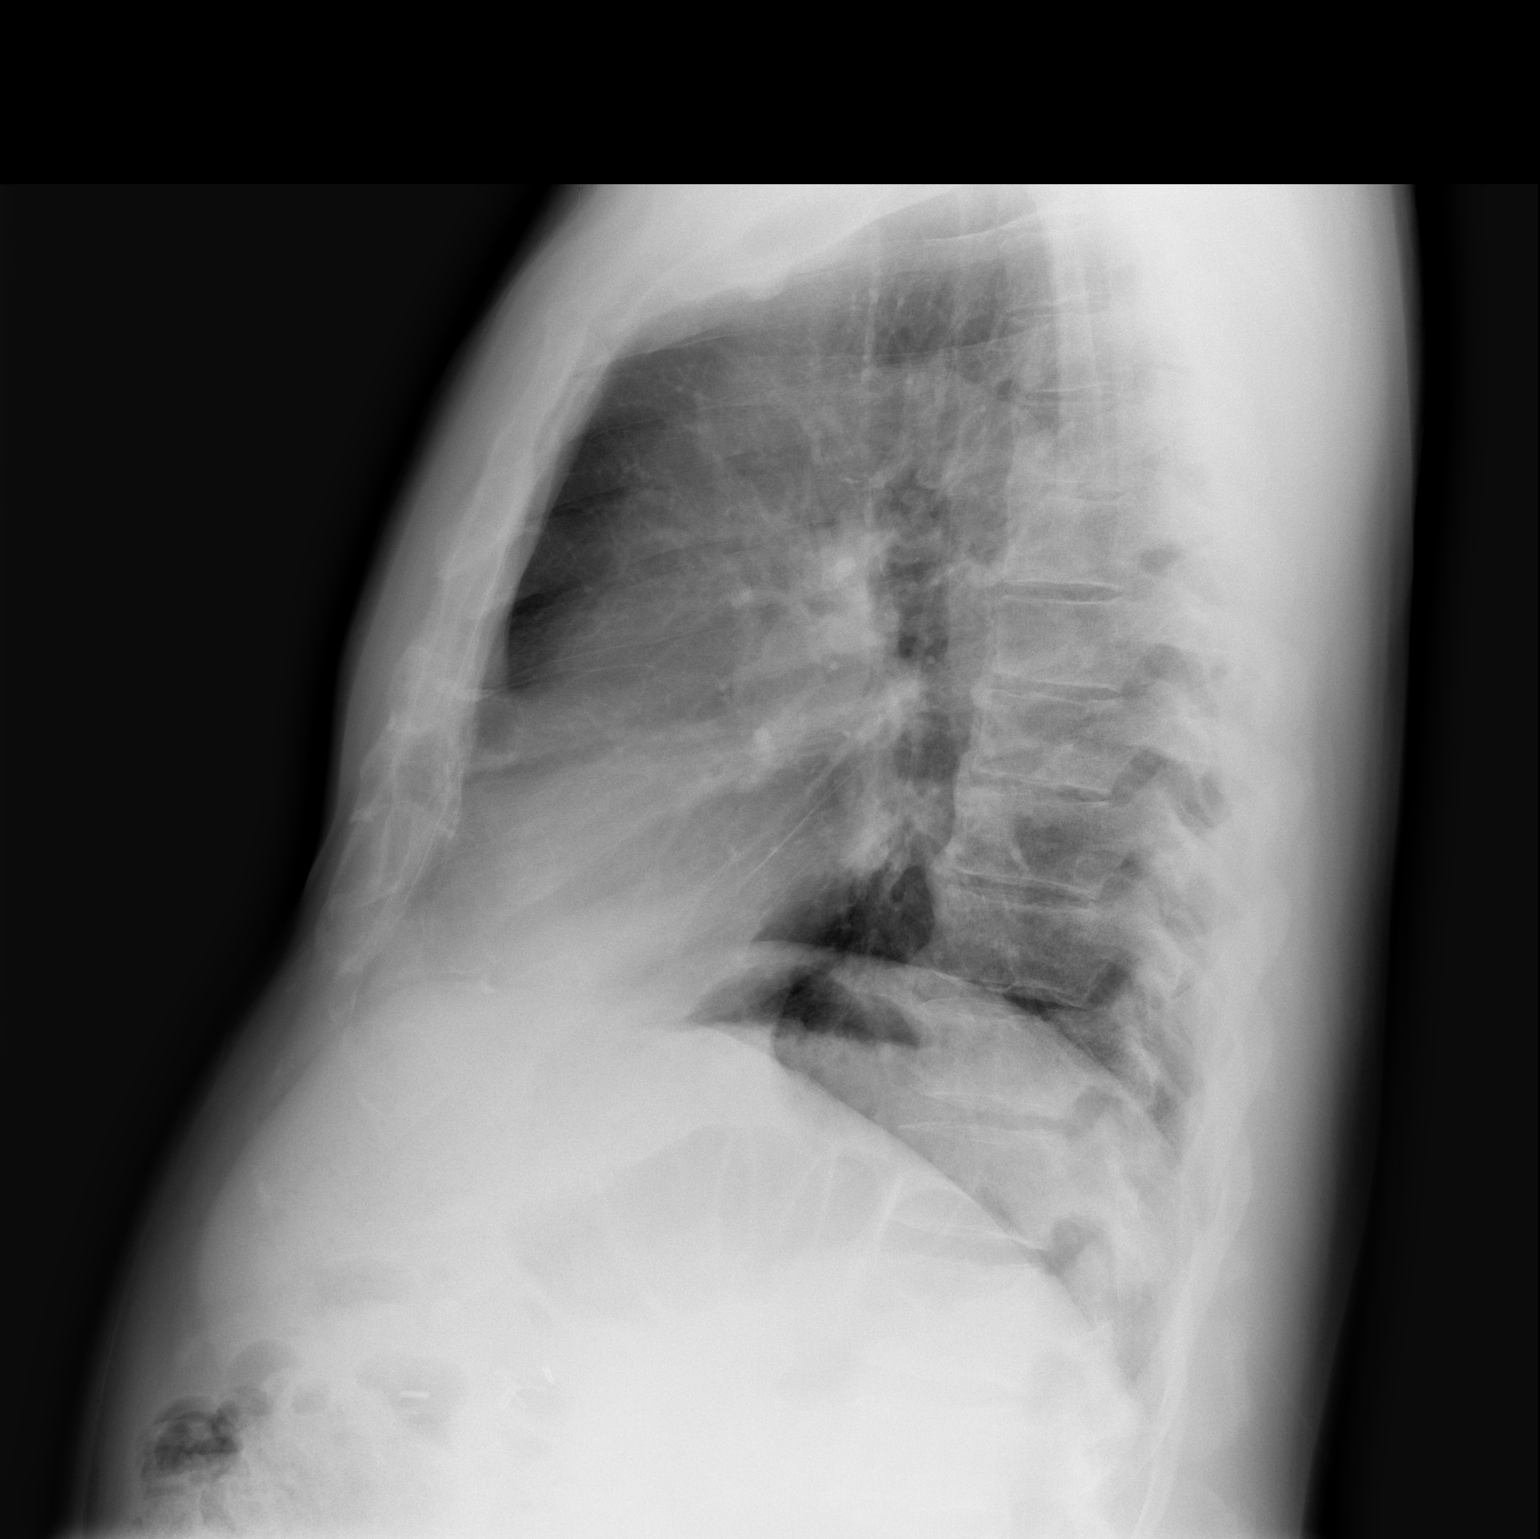

[2 of 2 positions shown; findings below may reference images not displayed]

FINDINGS: The trachea is midline.  The heart size is normal.  There is mild blunting of the right cardiophrenic angle likely secondary to a prominent right epicardial fat pad.  This is similar to on the prior exam.  Mediastinal contours are otherwise within normal limits.  The left hemidiaphragm is mildly elevated.  The costophrenic angles are sharp.  There is mild thoracic spondylosis.  The lungs are clear without focal opacity or congestive failure.
IMPRESSION: No acute cardiopulmonary disease.

## 2011-01-15 HISTORY — PX: SHOULDER SURGERY: SHX246

## 2013-10-12 ENCOUNTER — Other Ambulatory Visit: Payer: Self-pay | Admitting: Family Medicine

## 2013-10-12 DIAGNOSIS — R0989 Other specified symptoms and signs involving the circulatory and respiratory systems: Secondary | ICD-10-CM

## 2013-10-19 ENCOUNTER — Ambulatory Visit
Admission: RE | Admit: 2013-10-19 | Discharge: 2013-10-19 | Disposition: A | Discharge status: Home / Self Care | Payer: BC Managed Care – PPO | Source: Ambulatory Visit | Attending: Family Medicine | Admitting: Family Medicine

## 2013-10-19 ENCOUNTER — Encounter (INDEPENDENT_AMBULATORY_CARE_PROVIDER_SITE_OTHER): Payer: Self-pay

## 2013-10-19 ENCOUNTER — Other Ambulatory Visit: Payer: Self-pay | Admitting: Family Medicine

## 2013-10-19 DIAGNOSIS — R05 Cough: Secondary | ICD-10-CM

## 2013-10-19 DIAGNOSIS — R059 Cough, unspecified: Secondary | ICD-10-CM

## 2013-10-19 DIAGNOSIS — R0989 Other specified symptoms and signs involving the circulatory and respiratory systems: Secondary | ICD-10-CM

## 2013-10-19 IMAGING — CR DG CHEST 2V
2 series · 2 of 2 positions shown · non-contrast
Comparison: [DATE]

CLINICAL DATA: Cough and wheezing.

EXAM:
CHEST  2 VIEW

[view not recorded (1 of 2)]
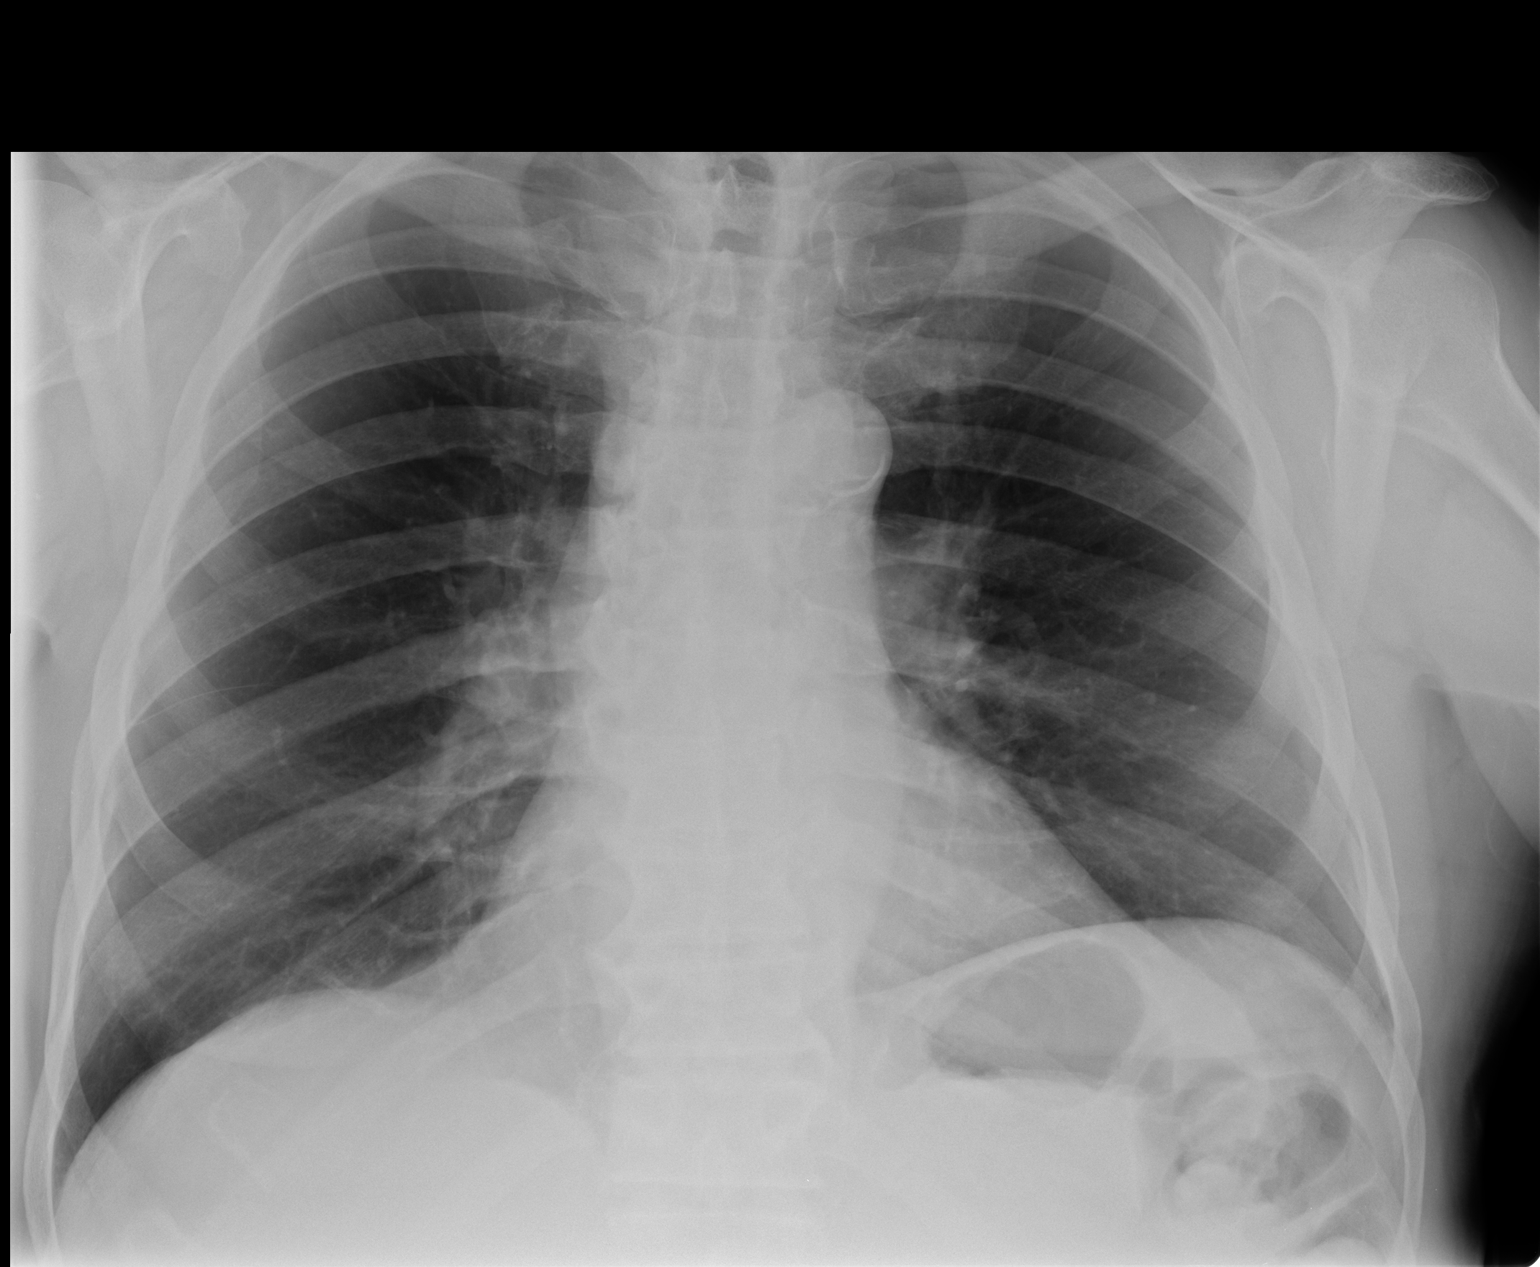

[view not recorded (2 of 2)]
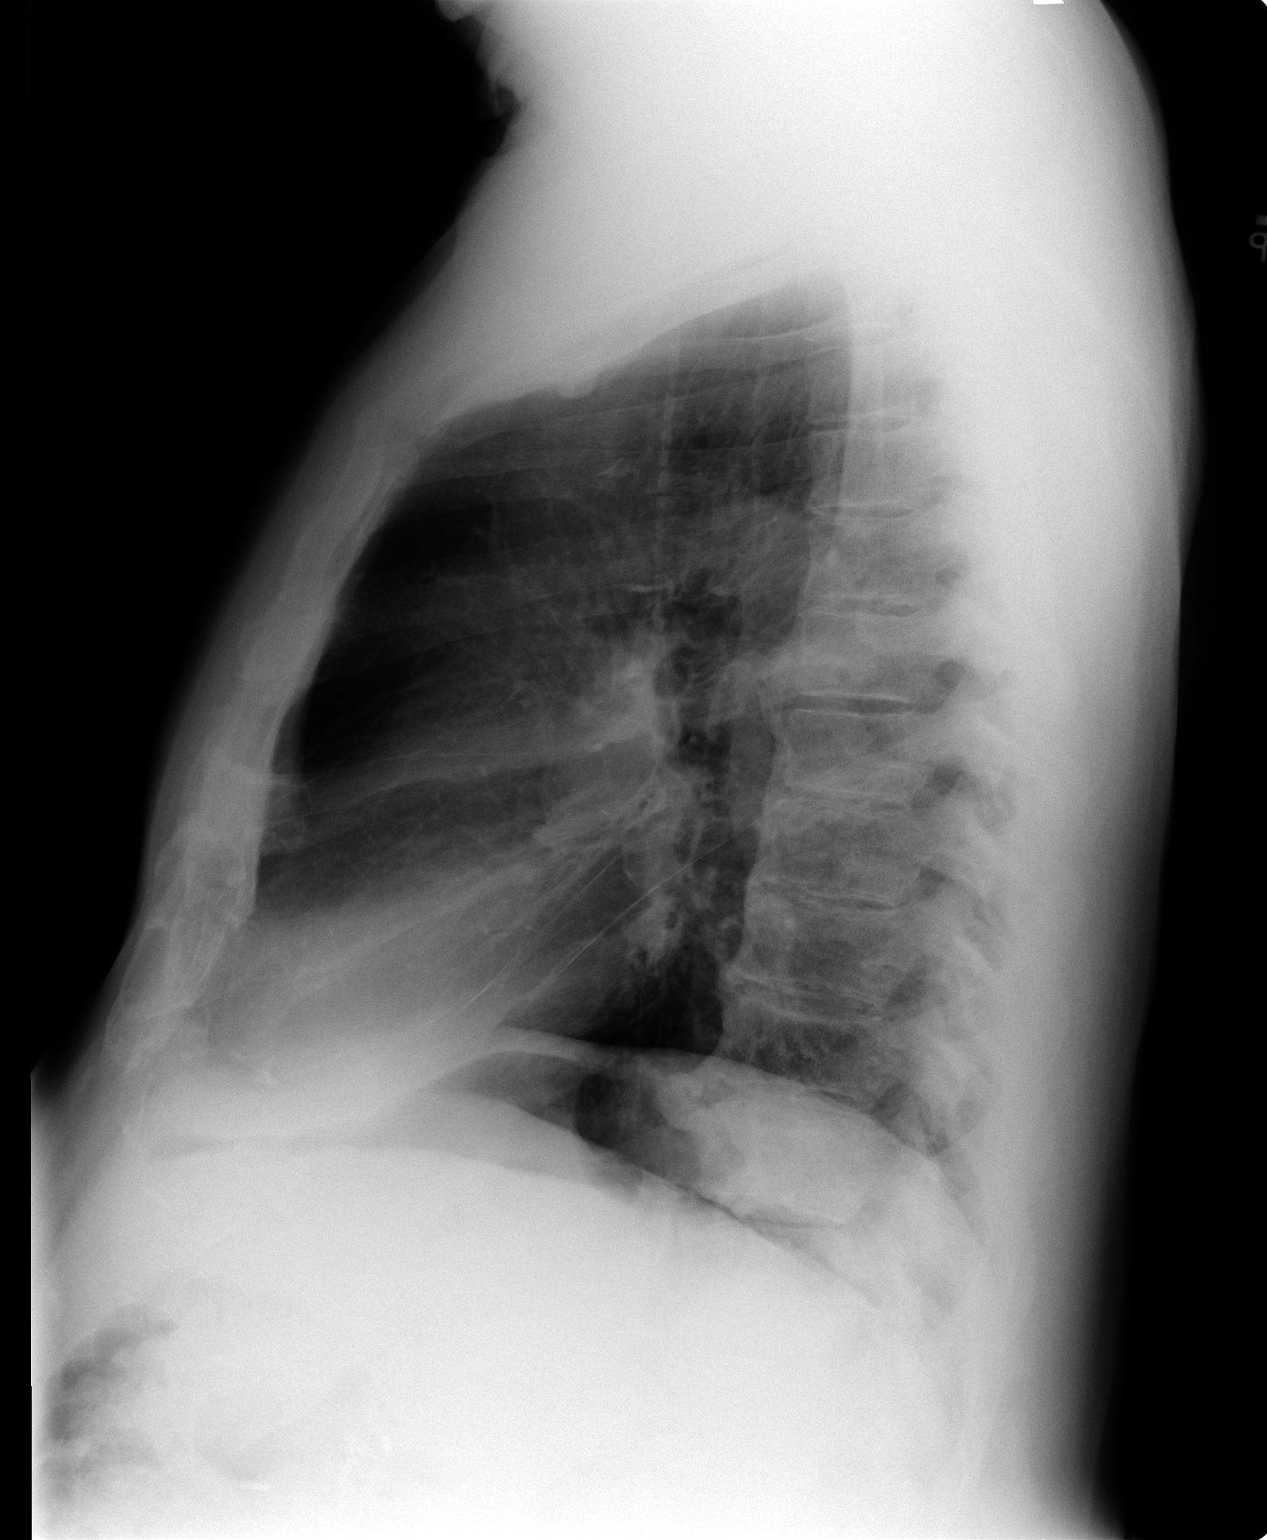

[2 of 2 positions shown; findings below may reference images not displayed]

FINDINGS: Mild elevation of the left hemidiaphragm is unchanged. Lungs are
clear bilaterally. Degenerative changes in the mid and lower
thoracic spine. Heart size is within normal limits and stable.
IMPRESSION: No acute cardiopulmonary disease.

## 2013-11-09 ENCOUNTER — Encounter: Payer: Self-pay | Admitting: Neurology

## 2013-11-10 ENCOUNTER — Ambulatory Visit (INDEPENDENT_AMBULATORY_CARE_PROVIDER_SITE_OTHER): Payer: BC Managed Care – PPO | Admitting: Neurology

## 2013-11-10 ENCOUNTER — Encounter: Payer: Self-pay | Admitting: Neurology

## 2013-11-10 VITALS — BP 128/67 | HR 65 | Ht 73.25 in | Wt 187.0 lb

## 2013-11-10 DIAGNOSIS — R209 Unspecified disturbances of skin sensation: Secondary | ICD-10-CM

## 2013-11-10 DIAGNOSIS — D518 Other vitamin B12 deficiency anemias: Secondary | ICD-10-CM

## 2013-11-10 DIAGNOSIS — E1142 Type 2 diabetes mellitus with diabetic polyneuropathy: Secondary | ICD-10-CM | POA: Insufficient documentation

## 2013-11-10 DIAGNOSIS — D513 Other dietary vitamin B12 deficiency anemia: Secondary | ICD-10-CM

## 2013-11-10 MED ORDER — PREGABALIN 50 MG PO CAPS: ORAL_CAPSULE | ORAL | Status: DC

## 2013-11-10 NOTE — Patient Instructions (Signed)

## 2013-11-10 NOTE — Progress Notes (Signed)
Reason for visit: Numbness in the feet  Harry Zamora is a 73 y.o. male  History of present illness:  Harry Zamora is a 73 year old right-handed white male with a history of diabetes over the last 3 years. The patient indicates that he has suffered from restless leg syndrome for 4 or 5 years. The patient indicates that he is having some numbness and tingly sensations, burning sensations and stinging sensations in the distal feet bilaterally, left greater right. He indicates that he feels better during the day when he is active, but the feet bother him more when he is off of his feet, and when he is trying to get to sleep at night. The patient was on gabapentin, but he recently was switched to Lyrica taking 50 mg at night. This dose is not adequate to fully control his symptoms. The patient is on ropinirole for his restless leg syndrome. The patient has undergone arterial Doppler studies of the lower extremities that were reportedly normal. Indicates that 6 months ago he began having the numbness in the feet, and around that time, his hemoglobin A1c was around 11. He has been able to get his hemoglobin A1c to around 7.5. The patient denies any significant balance issues. He occasionally will have some low back pain off of the right but he denies any sciatica type pain down the legs. The patient denies problems controlling the bowels or the bladder. He denies any neck pain or upper extremity discomfort or numbness in the hands. He is sent to this office for an evaluation. A recent B12 level was in the low normal range of 227. He is on vitamin B12 supplementation.  Past Medical History  Diagnosis Date  . Prostate cancer   . Hyperlipidemia   . Hypertriglyceridemia   . RLS (restless legs syndrome)   . Type 2 diabetes mellitus   . Insomnia   . Gynecomastia   . OA (osteoarthritis)   . Hypertension   . Polyneuropathy in diabetes(357.2)     Past Surgical History  Procedure Laterality Date  .  Shoulder surgery Left 01/2011    rotator cuff  . Prostate surgery    . Hemorrhoid surgery  1983  . Gallbladder surgery      Family History  Problem Relation Age of Onset  . Arthritis Mother   . Hypertension Mother   . Diabetes Mother   . Heart attack Mother   . Cancer Father     Social history:  reports that he has been smoking Cigars.  He has never used smokeless tobacco. He reports that he drinks alcohol. He reports that he does not use illicit drugs.  Medications:  Current Outpatient Prescriptions on File Prior to Visit  Medication Sig Dispense Refill  . Cyanocobalamin (VITAMIN B-12) 1000 MCG SUBL Place 1 tablet under the tongue daily.      . Dapagliflozin Propanediol (FARXIGA) 5 MG TABS Take 1 tablet by mouth daily.      Marland Kitchen gemfibrozil (LOPID) 600 MG tablet Take 600 mg by mouth 2 (two) times daily before a meal.      . glipiZIDE (GLUCOTROL XL) 5 MG 24 hr tablet Take 5 mg by mouth daily with breakfast.      . lisinopril (PRINIVIL,ZESTRIL) 20 MG tablet Take 20 mg by mouth daily.      Marland Kitchen lovastatin (MEVACOR) 40 MG tablet Take 40 mg by mouth at bedtime.      . meloxicam (MOBIC) 15 MG tablet Take 15 mg by  mouth daily.      . metformin (FORTAMET) 1000 MG (OSM) 24 hr tablet Take 1,000 mg by mouth daily with breakfast.      . Multiple Vitamins-Minerals (MULTIVITAMIN ADULTS 50+ PO) Take 1 tablet by mouth daily.      Marland Kitchen rOPINIRole (REQUIP) 4 MG tablet Take 4 mg by mouth at bedtime.       No current facility-administered medications on file prior to visit.      Allergies  Allergen Reactions  . Levaquin [Levofloxacin In D5w] Rash  . Niaspan [Niacin Er]   . Tramadol Rash    ROS:  Out of a complete 14 system review of symptoms, the patient complains only of the following symptoms, and all other reviewed systems are negative.  Ringing in the ears Allergies Restless legs Numbness in the feet  Blood pressure 128/67, pulse 65, height 6' 1.25" (1.861 m), weight 187 lb (84.823  kg).  Physical Exam  General: The patient is alert and cooperative at the time of the examination.  Eyes: Pupils are equal, round, and reactive to light. Discs are flat bilaterally.  Neck: The neck is supple, no carotid bruits are noted.  Respiratory: The respiratory examination is clear.  Cardiovascular: The cardiovascular examination reveals a regular rate and rhythm, no obvious murmurs or rubs are noted.  Skin: Extremities are without significant edema.  Neurologic Exam  Mental status: The patient is alert and oriented x 3 at the time of the examination. The patient has apparent normal recent and remote memory, with an apparently normal attention span and concentration ability.  Cranial nerves: Facial symmetry is present. There is good sensation of the face to pinprick and soft touch bilaterally. The strength of the facial muscles and the muscles to head turning and shoulder shrug are normal bilaterally. Speech is well enunciated, no aphasia or dysarthria is noted. Extraocular movements are full. Visual fields are full. The tongue is midline, and the patient has symmetric elevation of the soft palate. No obvious hearing deficits are noted.  Motor: The motor testing reveals 5 over 5 strength of all 4 extremities. Good symmetric motor tone is noted throughout.  Sensory: Sensory testing is intact to pinprick, soft touch, vibration sensation, and position sense on all 4 extremities, with exception that there is a stocking pattern pinprick sensory deficit two thirds the way up the legs bilaterally. No evidence of extinction is noted.  Coordination: Cerebellar testing reveals good finger-nose-finger and heel-to-shin bilaterally.  Gait and station: Gait is normal. Tandem gait is normal. Romberg is negative. No drift is seen.  Reflexes: Deep tendon reflexes are symmetric, but are depressed bilaterally. Toes are downgoing bilaterally.   Assessment/Plan:  1. 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 Diabetes   3. Restless leg syndrome  The patient likely has a mild diabetic peripheral neuropathy, and he has an associated restless leg syndrome. The patient is on low-dose Lyrica, and the dose will be increased gradually over time. The patient will undergo some further blood work today, and he will have nerve conduction studies of both legs and one arm, EMG evaluation of one leg. The patient will followup for the above study.  Jill Alexanders MD 11/12/2013 3:29 PM  [facility] [address] [place], [place] [address]  Phone [phone] Fax [phone]

## 2013-11-16 LAB — IFE AND PE, SERUM
ALBUMIN/GLOB SERPL: 1.3 (ref 0.7–2.0)
ALPHA 1: 0.3 g/dL (ref 0.1–0.4)
Albumin SerPl Elph-Mcnc: 3.9 g/dL (ref 3.2–5.6)
Alpha2 Glob SerPl Elph-Mcnc: 0.8 g/dL (ref 0.4–1.2)
B-GLOBULIN SERPL ELPH-MCNC: 1.3 g/dL (ref 0.6–1.3)
GAMMA GLOB SERPL ELPH-MCNC: 0.8 g/dL (ref 0.5–1.6)
GLOBULIN, TOTAL: 3.2 g/dL (ref 2.0–4.5)
IgA/Immunoglobulin A, Serum: 270 mg/dL (ref 91–414)
IgG (Immunoglobin G), Serum: 855 mg/dL (ref 700–1600)
IgM (Immunoglobulin M), Srm: 96 mg/dL (ref 40–230)
TOTAL PROTEIN: 7.1 g/dL (ref 6.0–8.5)

## 2013-11-16 LAB — ANA W/REFLEX: Anti Nuclear Antibody(ANA): NEGATIVE

## 2013-11-16 LAB — METHYLMALONIC ACID, SERUM: METHYLMALONIC ACID: 199 nmol/L (ref 0–378)

## 2013-11-16 LAB — ANGIOTENSIN CONVERTING ENZYME: Angio Convert Enzyme: <14 U/L — ABNORMAL LOW (ref 14–82)

## 2013-11-16 LAB — RHEUMATOID FACTOR

## 2013-11-16 LAB — COPPER, SERUM: COPPER: 97 ug/dL (ref 72–166)

## 2013-11-16 NOTE — Progress Notes (Signed)
Quick Note:  LMVM for pt/wife that lab work unremarkable. To call back if questions. ______

## 2013-11-24 ENCOUNTER — Encounter: Payer: BC Managed Care – PPO | Admitting: Radiology

## 2013-11-24 ENCOUNTER — Encounter: Payer: BC Managed Care – PPO | Admitting: Neurology

## 2013-12-29 ENCOUNTER — Other Ambulatory Visit: Payer: Self-pay | Admitting: Family Medicine

## 2013-12-29 ENCOUNTER — Ambulatory Visit
Admission: RE | Admit: 2013-12-29 | Discharge: 2013-12-29 | Disposition: A | Discharge status: Home / Self Care | Payer: BC Managed Care – PPO | Source: Ambulatory Visit | Attending: Family Medicine | Admitting: Family Medicine

## 2013-12-29 DIAGNOSIS — R1032 Left lower quadrant pain: Secondary | ICD-10-CM

## 2013-12-29 IMAGING — CT CT ABD-PELV W/ CM
2 of 5 series · 17 of 46 positions shown, 19 images · IV contrast (30CC OMNI 300 & [ID] OMNI 300)
Comparison: Abdominal ultrasound [DATE]

CLINICAL DATA: Left lower quadrant pain for 3-4 months. History of
cholecystectomy and prostate cancer with seed implants 8 years ago.

EXAM:
CT ABDOMEN AND PELVIS WITH CONTRAST
TECHNIQUE: Multidetector CT imaging of the abdomen and pelvis was performed
using the standard protocol following bolus administration of
intravenous contrast.
CONTRAST:  100mL OMNIPAQUE IOHEXOL 300 MG/ML SOLN, 30mL OMNIPAQUE
IOHEXOL 300 MG/ML SOLN

[Series 2: abd/pelvis with · axial · 0.74mm/px · z∈[-498,-73]mm · 14 of 96 slices shown, 16 images]
[im 6/96  soft-tissue]
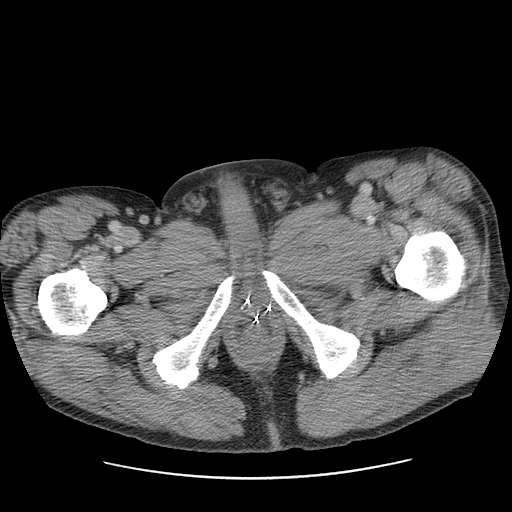
[im 6/96  bone]
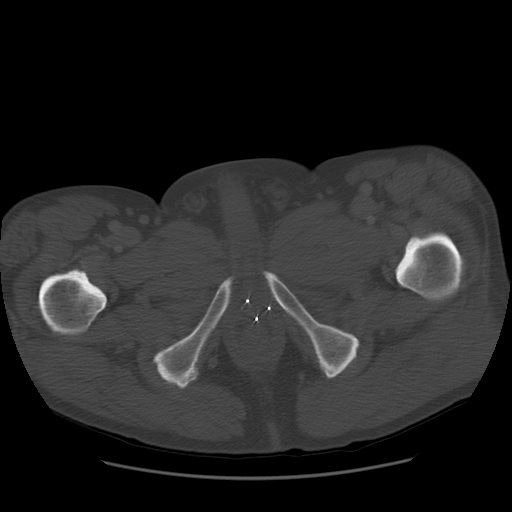
[im 11/96  soft-tissue]
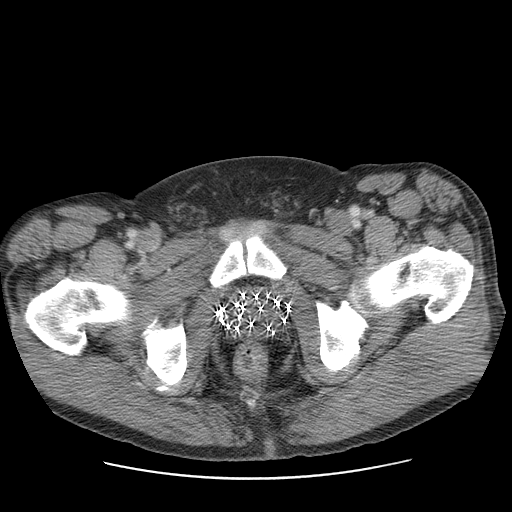
[im 21/96  soft-tissue]
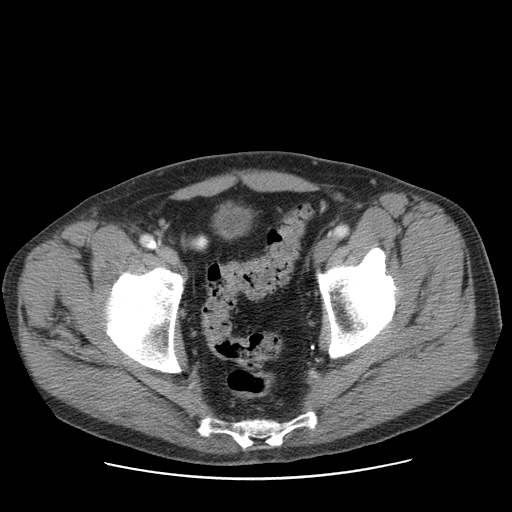
[im 26/96  soft-tissue]
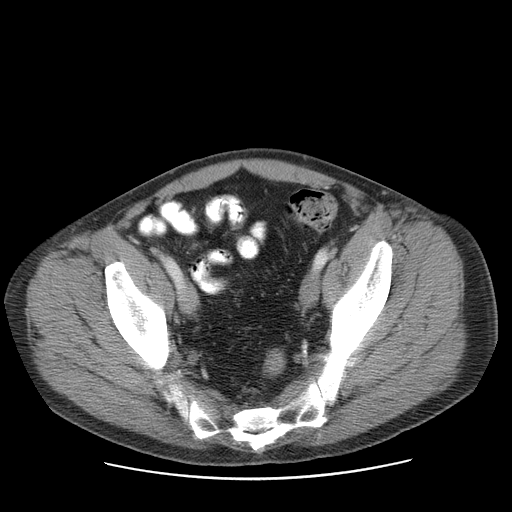
[im 31/96  soft-tissue]
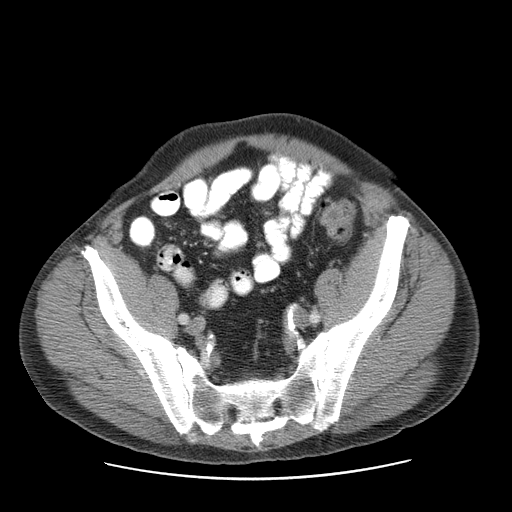
[im 41/96  soft-tissue]
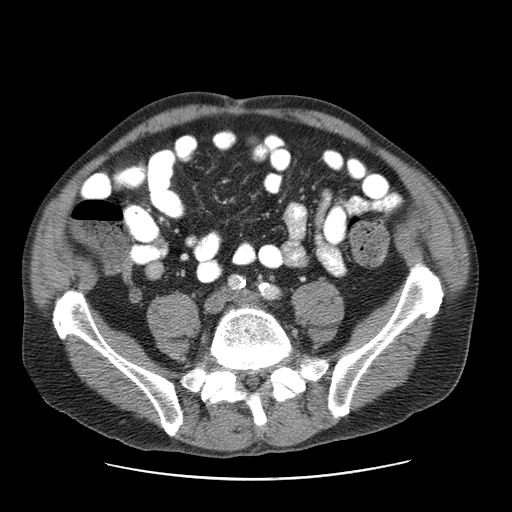
[im 46/96  soft-tissue]
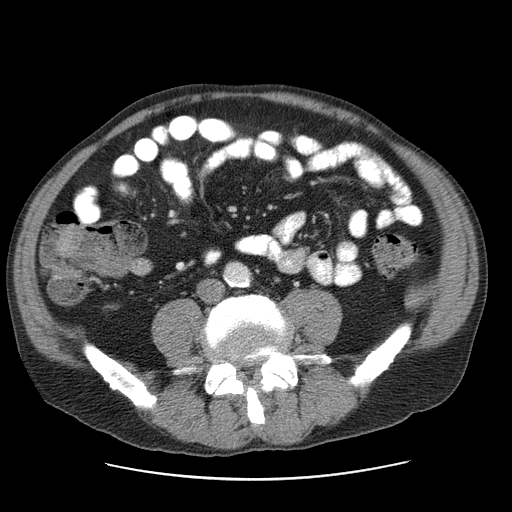
[im 51/96  soft-tissue]
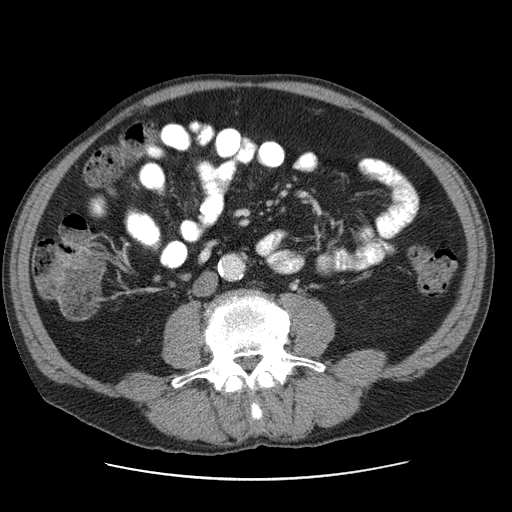
[im 56/96  soft-tissue]
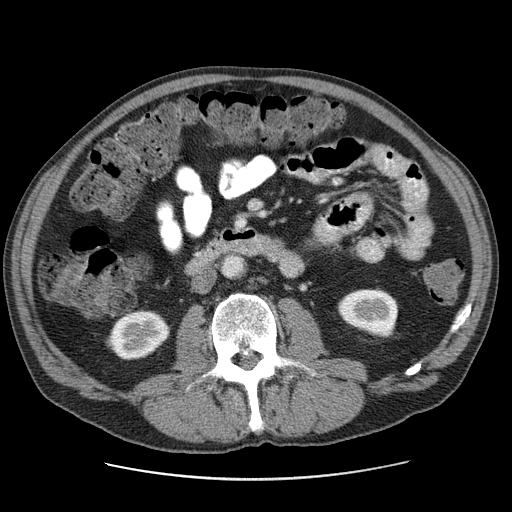
[im 56/96  bone]
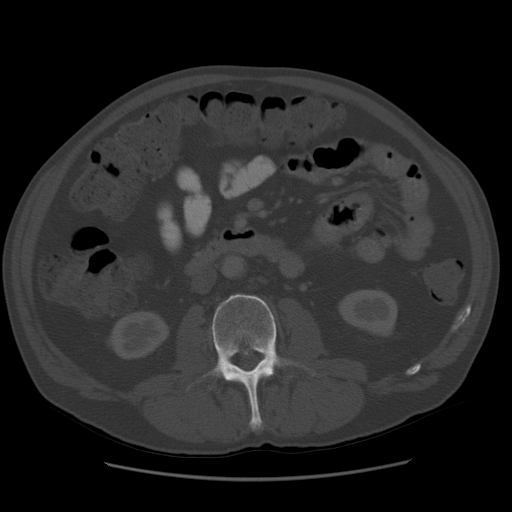
[im 66/96  soft-tissue]
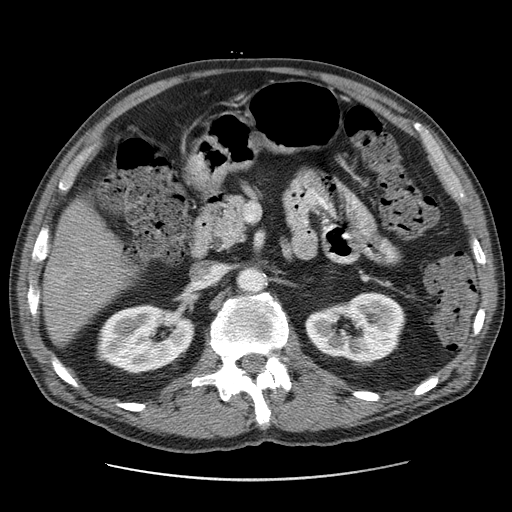
[im 71/96  soft-tissue]
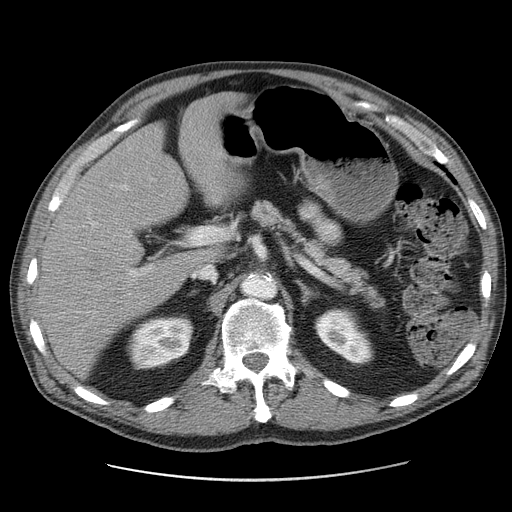
[im 76/96  soft-tissue]
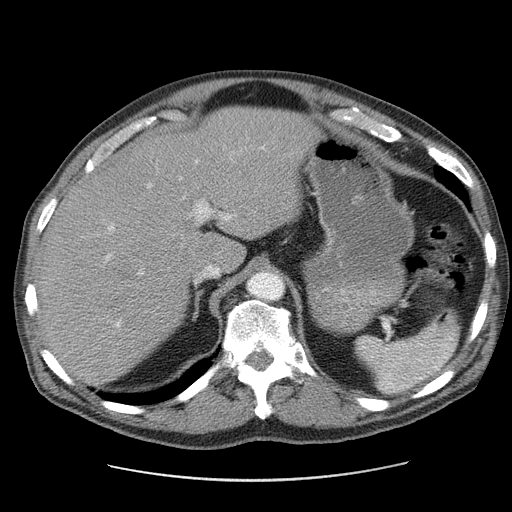
[im 86/96  soft-tissue]
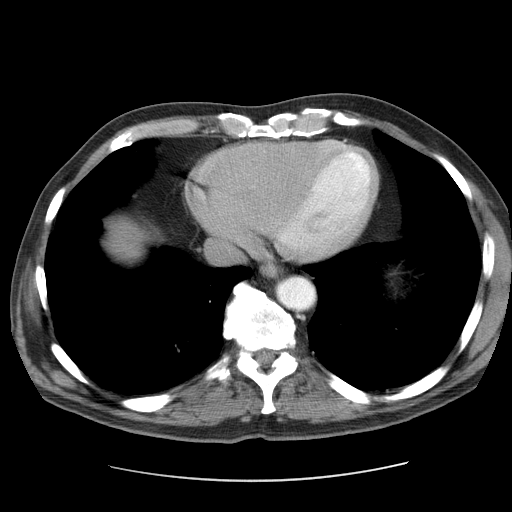
[im 91/96  soft-tissue]
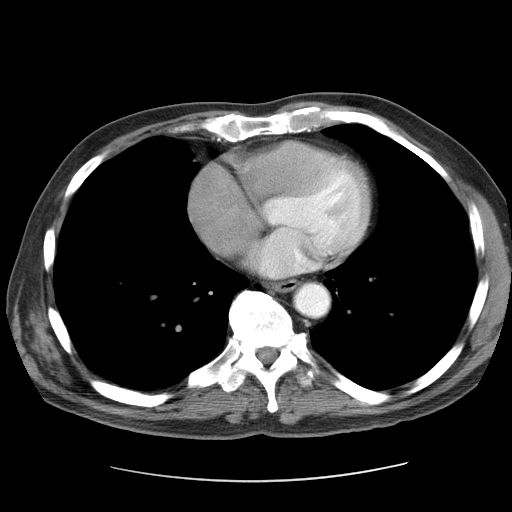

[Series 401: cor · coronal · 0.96mm/px · 3 of 132 slices shown]
[im 44/132  soft-tissue]
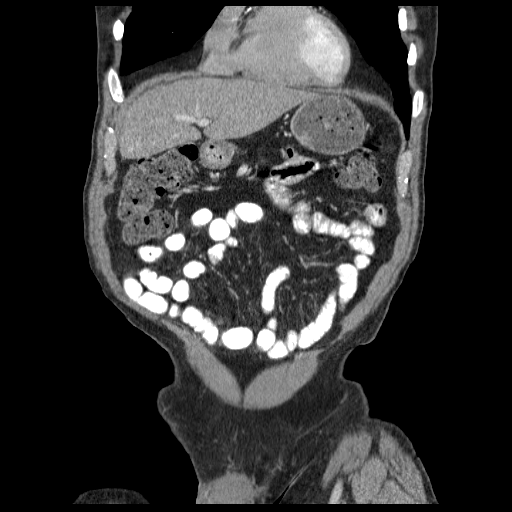
[im 59/132  soft-tissue]
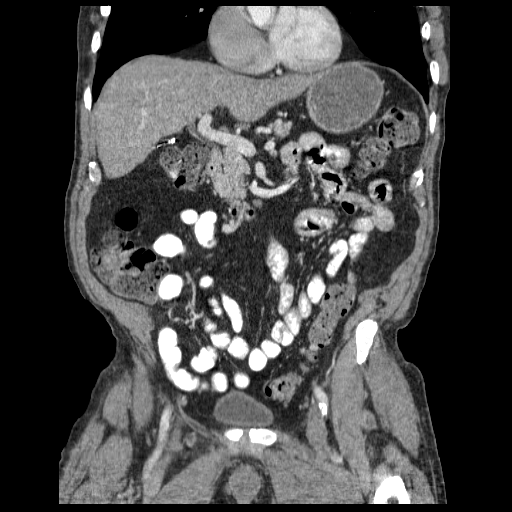
[im 73/132  soft-tissue]
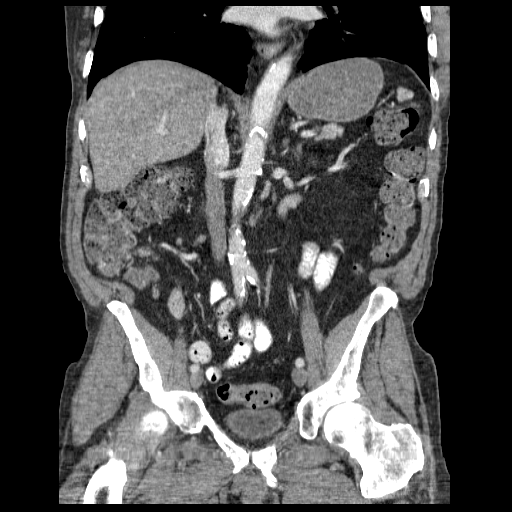

[17 of 46 positions shown; findings below may reference images not displayed]

FINDINGS: 4 mm calcified granuloma is noted in the left lower lobe (series 3,
image 15). 2 mm nodule in the left lower lobe on image 1 is also
likely calcified. There is no pleural effusion.

Diffusely low attenuation of the liver is consistent with steatosis
as seen on prior ultrasound. No focal liver lesion is identified.
The gallbladder is surgically absent. Common bile duct measures up
to 9 mm in diameter, within normal limits given prior
cholecystectomy. The spleen, adrenal glands, and kidneys, and
pancreas have an unremarkable enhanced appearance.

Oral contrast is present in multiple loops of nondilated small bowel
without evidence of obstruction. Sigmoid colon diverticulosis is
noted without definite wall thickening or surrounding inflammatory
changes seen to suggest acute diverticulitis. Appendix is identified
in the right lower quadrant and is unremarkable.

Brachytherapy seeds are noted in the prostate. Bladder is grossly
unremarkable. Moderate aortoiliac atherosclerotic calcification is
noted. No free fluid or enlarged lymph nodes are identified.
Thoracolumbar spondylosis is noted. No suspicious lytic or blastic
osseous lesions are identified.
IMPRESSION: 1. Diverticulosis without evidence of diverticulitis.
2. Hepatic steatosis.

## 2013-12-29 MED ORDER — IOHEXOL 300 MG/ML  SOLN
30.0000 mL | Freq: Once | INTRAMUSCULAR | Status: AC | PRN
  Administered 2013-12-29: 30 mL via ORAL

## 2013-12-29 MED ORDER — IOHEXOL 300 MG/ML  SOLN
100.0000 mL | Freq: Once | INTRAMUSCULAR | Status: AC | PRN
  Administered 2013-12-29: 100 mL via INTRAVENOUS

## 2014-01-31 ENCOUNTER — Encounter: Payer: Self-pay | Admitting: Neurology

## 2014-02-06 ENCOUNTER — Encounter: Payer: Self-pay | Admitting: Neurology

## 2014-04-06 ENCOUNTER — Other Ambulatory Visit: Payer: Self-pay | Admitting: Neurology

## 2014-04-09 ENCOUNTER — Other Ambulatory Visit: Payer: Self-pay

## 2014-04-09 MED ORDER — PREGABALIN 50 MG PO CAPS: ORAL_CAPSULE | ORAL | Status: DC

## 2014-04-09 NOTE — Telephone Encounter (Signed)
Rx signed and faxed.

## 2014-06-20 ENCOUNTER — Other Ambulatory Visit: Payer: Self-pay | Admitting: Family Medicine

## 2014-06-20 ENCOUNTER — Ambulatory Visit
Admission: RE | Admit: 2014-06-20 | Discharge: 2014-06-20 | Disposition: A | Discharge status: Home / Self Care | Payer: BC Managed Care – PPO | Source: Ambulatory Visit | Attending: Family Medicine | Admitting: Family Medicine

## 2014-06-20 DIAGNOSIS — R05 Cough: Secondary | ICD-10-CM

## 2014-06-20 DIAGNOSIS — R059 Cough, unspecified: Secondary | ICD-10-CM

## 2014-06-20 IMAGING — CR DG CHEST 2V
2 series · 2 of 2 positions shown · non-contrast
Comparison: [DATE]

CLINICAL DATA: One week history of cough.  Intermittent chest pain

EXAM:
CHEST  2 VIEW

[w chest pa]
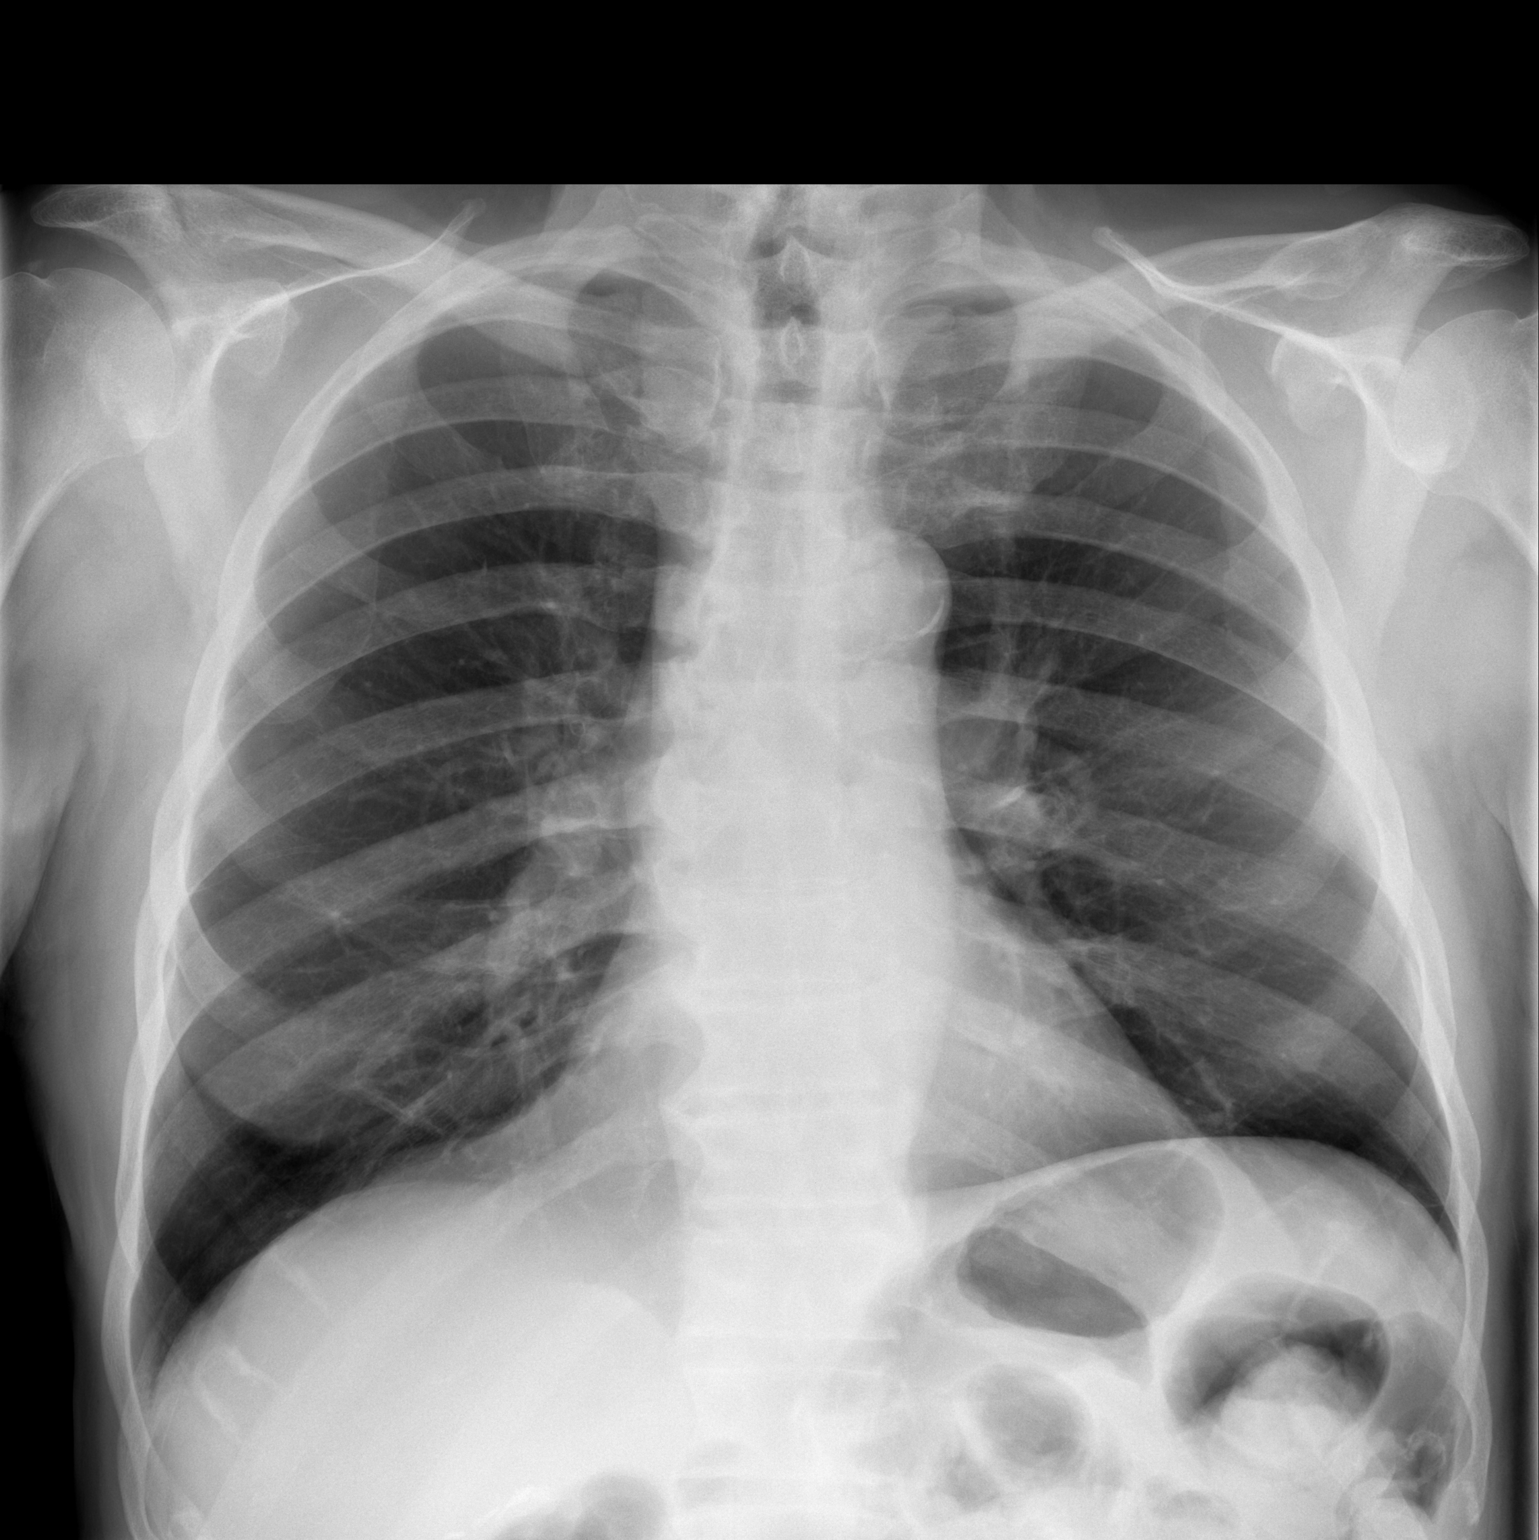

[w chest lat]
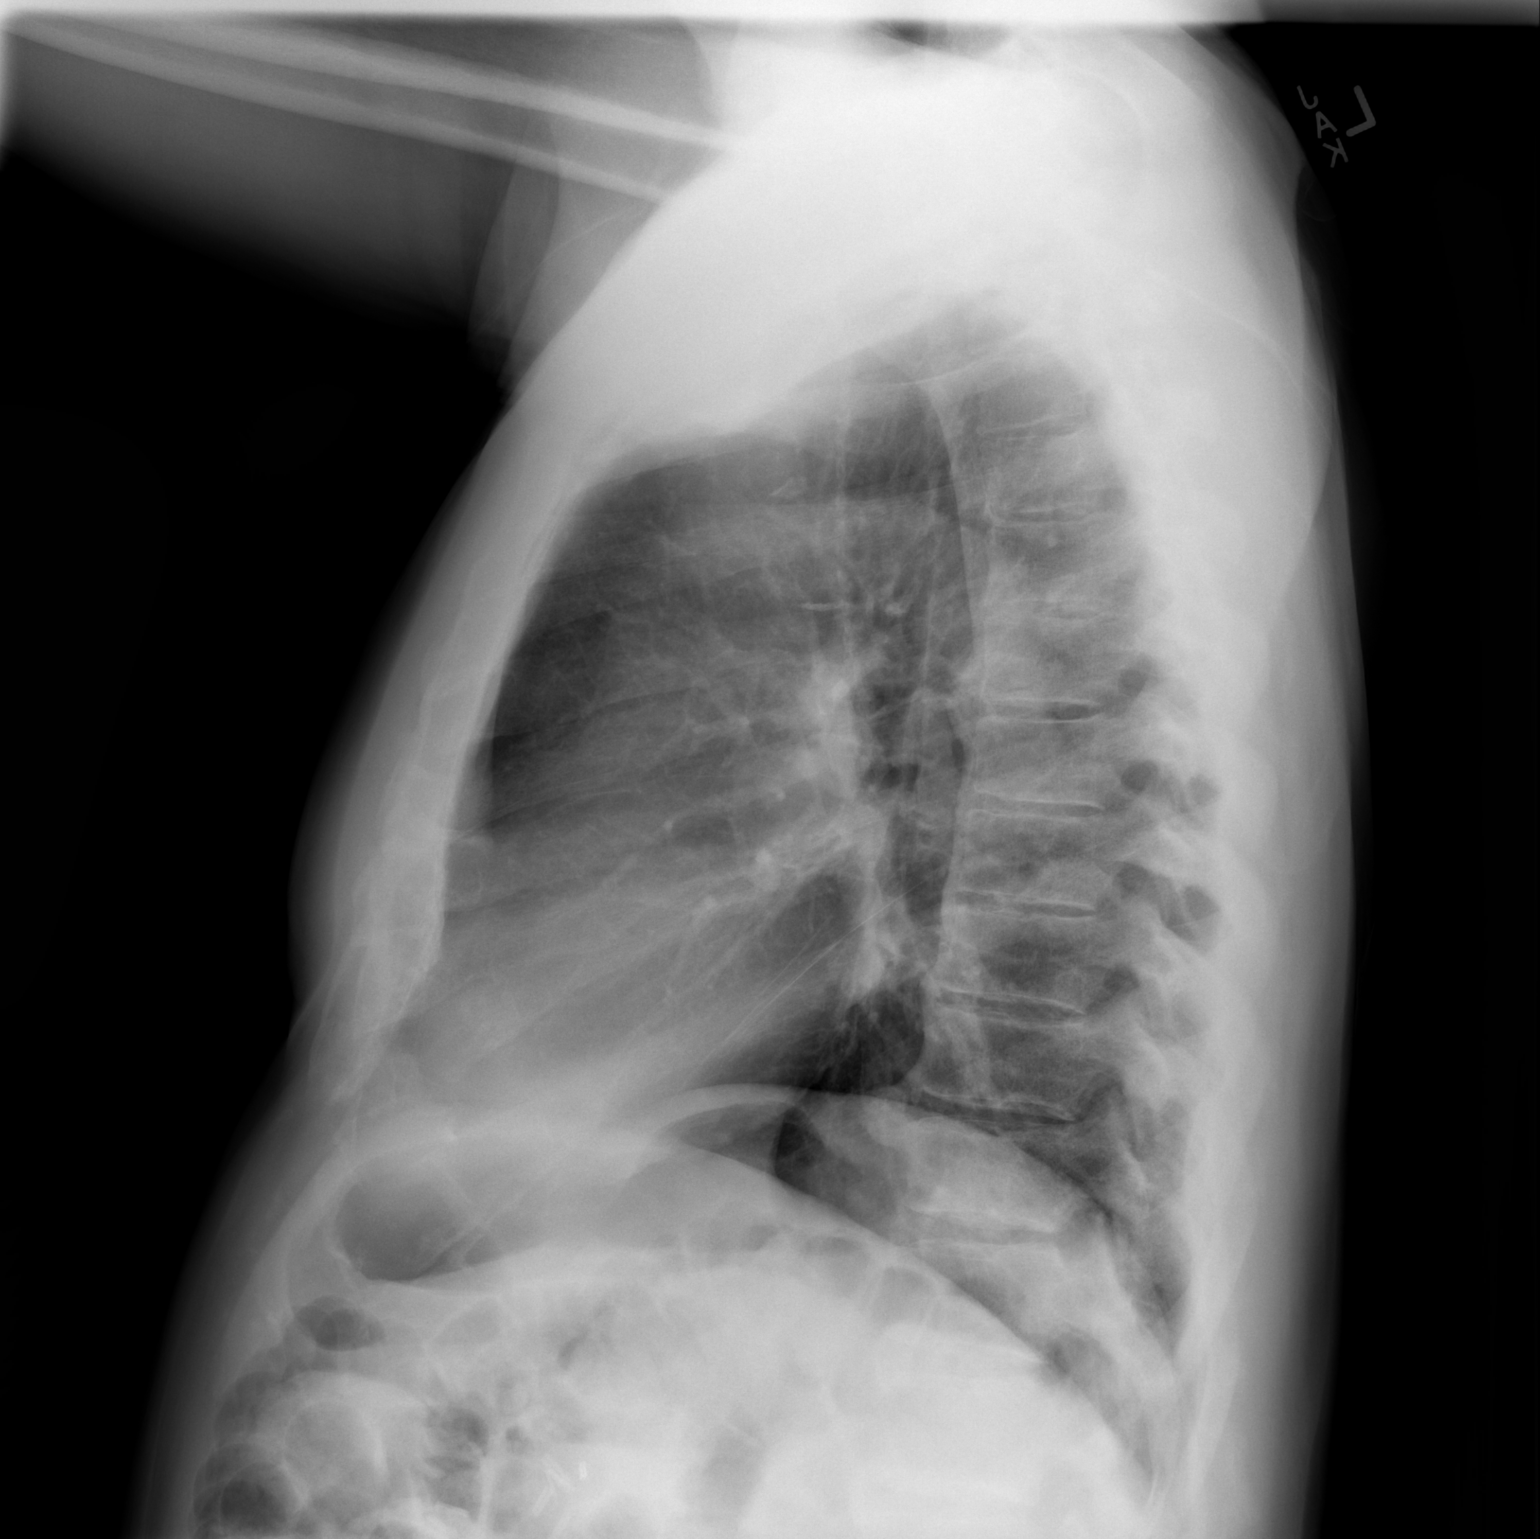

[2 of 2 positions shown; findings below may reference images not displayed]

FINDINGS: There is no edema or consolidation. Heart size and pulmonary
vascularity are normal. No adenopathy. There is atherosclerotic
change in the aortic arch region. No pneumothorax. There is
degenerative change in the thoracic spine.
IMPRESSION: No edema or consolidation.

## 2014-09-02 ENCOUNTER — Other Ambulatory Visit: Payer: Self-pay | Admitting: Neurology

## 2014-09-04 ENCOUNTER — Other Ambulatory Visit: Payer: Self-pay

## 2014-09-04 MED ORDER — PREGABALIN 50 MG PO CAPS: ORAL_CAPSULE | ORAL | Status: DC

## 2014-09-04 NOTE — Telephone Encounter (Signed)
Rx signed and faxed.

## 2016-03-23 DIAGNOSIS — M5136 Other intervertebral disc degeneration, lumbar region: Secondary | ICD-10-CM | POA: Diagnosis not present

## 2016-03-23 DIAGNOSIS — M545 Low back pain: Secondary | ICD-10-CM | POA: Diagnosis not present

## 2016-04-10 DIAGNOSIS — E1140 Type 2 diabetes mellitus with diabetic neuropathy, unspecified: Secondary | ICD-10-CM | POA: Diagnosis not present

## 2016-04-10 DIAGNOSIS — G2581 Restless legs syndrome: Secondary | ICD-10-CM | POA: Diagnosis not present

## 2016-04-10 DIAGNOSIS — E785 Hyperlipidemia, unspecified: Secondary | ICD-10-CM | POA: Diagnosis not present

## 2016-04-10 DIAGNOSIS — I10 Essential (primary) hypertension: Secondary | ICD-10-CM | POA: Diagnosis not present

## 2016-04-10 DIAGNOSIS — Z7984 Long term (current) use of oral hypoglycemic drugs: Secondary | ICD-10-CM | POA: Diagnosis not present

## 2016-04-10 DIAGNOSIS — R634 Abnormal weight loss: Secondary | ICD-10-CM | POA: Diagnosis not present

## 2016-04-13 DIAGNOSIS — E875 Hyperkalemia: Secondary | ICD-10-CM | POA: Diagnosis not present

## 2016-05-04 DIAGNOSIS — I10 Essential (primary) hypertension: Secondary | ICD-10-CM | POA: Diagnosis present

## 2016-05-04 DIAGNOSIS — K760 Fatty (change of) liver, not elsewhere classified: Secondary | ICD-10-CM | POA: Diagnosis present

## 2016-05-04 DIAGNOSIS — E785 Hyperlipidemia, unspecified: Secondary | ICD-10-CM | POA: Diagnosis present

## 2016-07-21 IMAGING — CR DG LUMBAR SPINE 1V
1 series · 1 of 1 positions shown · non-contrast
Comparison: [DATE]

CLINICAL DATA: Lumbar laminectomy

EXAM:
LUMBAR SPINE - 1 VIEW

[lateral]
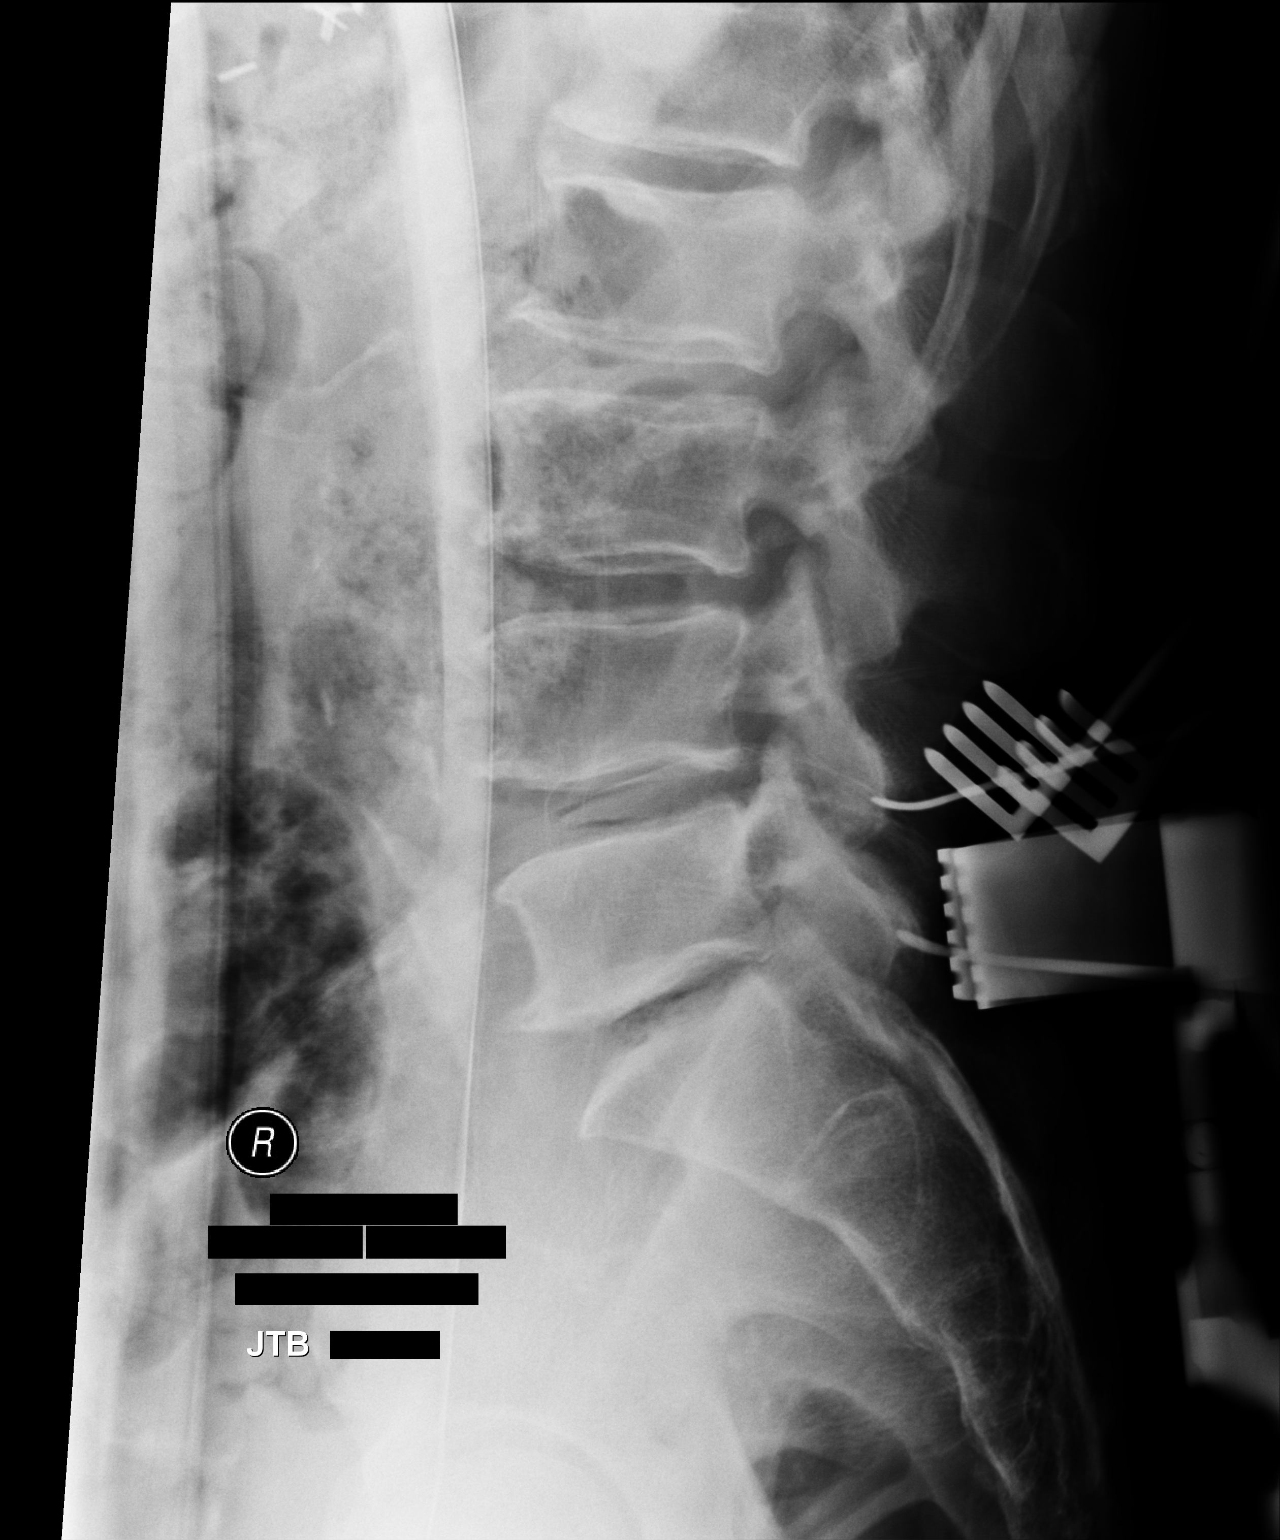

[1 of 1 positions shown; findings below may reference images not displayed]

FINDINGS: Surgical retractors in instruments are noted at the L4-5 and L5-S1
disc space. Numbering nomenclature similar to that utilized on prior
MRI examination.
IMPRESSION: Lumbar localization as described.

## 2016-09-15 ENCOUNTER — Other Ambulatory Visit: Payer: Self-pay | Admitting: Neurosurgery

## 2016-09-22 ENCOUNTER — Other Ambulatory Visit (HOSPITAL_COMMUNITY): Payer: Self-pay | Admitting: *Deleted

## 2016-09-22 ENCOUNTER — Encounter (HOSPITAL_COMMUNITY): Payer: Self-pay

## 2016-09-22 NOTE — Pre-Procedure Instructions (Signed)
Faizan Geraci [facility]  09/22/2016    Your procedure is scheduled on Friday, October 02, 2016 at 1:50 PM.   Report to [facility] Entrance "A" Admitting Office at 11:50 AM.   Call this number if you have problems the morning of surgery: [phone]   Questions prior to day of surgery, please call [phone] between 8 & 4 PM.   Remember:  Do not eat food or drink liquids after midnight Thursday, 10/01/16.   Take these medicines the morning of surgery with A SIP OF WATER: Bupropion (Wellbutrin), Gabapentin (Neurontin), Tylenol  Stop NSAIDS (Mobic, Aleve, Ibuprofen, etc) and Multivitamins 5 days prior to surgery. Do not use Aspirin products 5 days prior to surgery.   How to Manage Your Diabetes Before Surgery   Why is it important to control my blood sugar before and after surgery?   Improving blood sugar levels before and after surgery helps healing and can limit problems.  A way of improving blood sugar control is eating a healthy diet by:  - Eating less sugar and carbohydrates  - Increasing activity/exercise  - Talk with your doctor about reaching your blood sugar goals  High blood sugars (greater than 180 mg/dL) can raise your risk of infections and slow down your recovery so you will need to focus on controlling your diabetes during the weeks before surgery.  Make sure that the doctor who takes care of your diabetes knows about your planned surgery including the date and location.  How do I manage my blood sugars before surgery?   Check your blood sugar at least 4 times a day, 2 days before surgery to make sure that they are not too high or low.  Check your blood sugar the morning of your surgery when you wake up and every 2 hours until you get to the Short-Stay unit.  Treat a low blood sugar (less than 70 mg/dL) with 1/2 cup of clear juice (cranberry or apple), 4 glucose tablets, OR glucose gel.  Recheck blood sugar in 15 minutes after treatment (to make sure it is  greater than 70 mg/dL).  If blood sugar is not greater than 70 mg/dL on re-check, call [phone] for further instructions.   Report your blood sugar to the Short-Stay nurse when you get to Short-Stay.  References:  [employer], 2007 "How to Manage your Diabetes Before and After Surgery".  What do I do about my diabetes medications?   Do not take oral diabetes medicines (pills) the morning of surgery.   Do not wear jewelry.  Do not wear lotions, powders, cologne or deodorant.  Men may shave face and neck.  Do not bring valuables to the hospital.  [facility] is not responsible for any belongings or valuables.  Contacts, dentures or bridgework may not be worn into surgery.  Leave your suitcase in the car.  After surgery it may be brought to your room.  For patients admitted to the hospital, discharge time will be determined by your treatment team.  Patients discharged the day of surgery will not be allowed to drive home.   Special instructions:  [facility] - Preparing for Surgery  Before surgery, you can play an important role.  Because skin is not sterile, your skin needs to be as free of germs as possible.  You can reduce the number of germs on you skin by washing with CHG (chlorahexidine gluconate) soap before surgery.  CHG is an antiseptic cleaner which kills germs  and bonds with the skin to continue killing germs even after washing.  Please DO NOT use if you have an allergy to CHG or antibacterial soaps.  If your skin becomes reddened/irritated stop using the CHG and inform your nurse when you arrive at Short Stay.  Do not shave (including legs and underarms) for at least 48 hours prior to the first CHG shower.  You may shave your face.  Please follow these instructions carefully:   1.  Shower with CHG Soap the night before surgery and the                    morning of Surgery.  2.  If you choose to wash your hair, wash your hair first as usual  with your       normal shampoo.  3.  After you shampoo, rinse your hair and body thoroughly to remove the shampoo.  4.  Use CHG as you would any other liquid soap.  You can apply chg directly       to the skin and wash gently with scrungie or a clean washcloth.  5.  Apply the CHG Soap to your body ONLY FROM THE NECK DOWN.        Do not use on open wounds or open sores.  Avoid contact with your eyes, ears, mouth and genitals (private parts).  Wash genitals (private parts) with your normal soap.  6.  Wash thoroughly, paying special attention to the area where your surgery        will be performed.  7.  Thoroughly rinse your body with warm water from the neck down.  8.  DO NOT shower/wash with your normal soap after using and rinsing off       the CHG Soap.  9.  Pat yourself dry with a clean towel.            10.  Wear clean pajamas.            11.  Place clean sheets on your bed the night of your first shower and do not        sleep with pets.  Day of Surgery  Do not apply any lotions/deodorants the morning of surgery.  Please wear clean clothes to the hospital.   Please read over the fact sheets that you were given.

## 2016-09-23 ENCOUNTER — Encounter (HOSPITAL_COMMUNITY): Payer: Self-pay

## 2016-09-23 ENCOUNTER — Encounter (HOSPITAL_COMMUNITY)
Admission: RE | Admit: 2016-09-23 | Discharge: 2016-09-23 | Disposition: A | Discharge status: Home / Self Care | Payer: Medicare Other | Source: Ambulatory Visit | Attending: Neurosurgery | Admitting: Neurosurgery

## 2016-09-23 DIAGNOSIS — I10 Essential (primary) hypertension: Secondary | ICD-10-CM | POA: Insufficient documentation

## 2016-09-23 DIAGNOSIS — Z01810 Encounter for preprocedural cardiovascular examination: Secondary | ICD-10-CM | POA: Diagnosis not present

## 2016-09-23 DIAGNOSIS — M48062 Spinal stenosis, lumbar region with neurogenic claudication: Secondary | ICD-10-CM | POA: Diagnosis not present

## 2016-09-23 HISTORY — DX: Major depressive disorder, single episode, unspecified: F32.9

## 2016-09-23 HISTORY — DX: Depression, unspecified: F32.A

## 2016-09-23 HISTORY — DX: Anemia, unspecified: D64.9

## 2016-09-23 HISTORY — DX: Restless legs syndrome: G25.81

## 2016-09-23 LAB — BASIC METABOLIC PANEL
Anion gap: 9 (ref 5–15)
BUN: 31 mg/dL — AB (ref 6–20)
CALCIUM: 9.6 mg/dL (ref 8.9–10.3)
CO2: 24 mmol/L (ref 22–32)
CREATININE: 1.57 mg/dL — AB (ref 0.61–1.24)
Chloride: 105 mmol/L (ref 101–111)
GFR calc Af Amer: 47 mL/min — ABNORMAL LOW (ref >60)
GFR calc non Af Amer: 41 mL/min — ABNORMAL LOW (ref >60)
GLUCOSE: 260 mg/dL — AB (ref 65–99)
Potassium: 5.5 mmol/L — ABNORMAL HIGH (ref 3.5–5.1)
Sodium: 138 mmol/L (ref 135–145)

## 2016-09-23 LAB — CBC
HCT: 42.2 % (ref 39.0–52.0)
Hemoglobin: 13.3 g/dL (ref 13.0–17.0)
MCH: 29.6 pg (ref 26.0–34.0)
MCHC: 31.5 g/dL (ref 30.0–36.0)
MCV: 94 fL (ref 78.0–100.0)
PLATELETS: 198 10*3/uL (ref 150–400)
RBC: 4.49 MIL/uL (ref 4.22–5.81)
RDW: 13.3 % (ref 11.5–15.5)
WBC: 5.3 10*3/uL (ref 4.0–10.5)

## 2016-09-23 LAB — GLUCOSE, CAPILLARY: GLUCOSE-CAPILLARY: 212 mg/dL — AB (ref 65–99)

## 2016-09-23 LAB — SURGICAL PCR SCREEN
MRSA, PCR: NEGATIVE
Staphylococcus aureus: POSITIVE — AB

## 2016-09-23 NOTE — Progress Notes (Signed)
Pt denies cardiac history, chest pain or sob. Pt is a type 2 diabetic that is only on oral medications. He states his last A1C was 9.0 in early May. States Dr. Forde Dandy did not increase or change meds at that time.

## 2016-09-23 NOTE — Progress Notes (Signed)
Mupirocin Ointment called into [facility] on [address] for positive PCR of Staph. Left message on pt's voicemail informing him of results and need to pick up Rx.

## 2016-09-24 LAB — HEMOGLOBIN A1C
HEMOGLOBIN A1C: 8.8 % — AB (ref 4.8–5.6)
Mean Plasma Glucose: 206 mg/dL

## 2016-09-28 NOTE — Progress Notes (Signed)
Anesthesia Chart Review:  Patient is a 76 year old male scheduled for L3-S1 laminectomy on 10/02/2016 with Erline Levine, M.D.  - PCP is [name], MD  PMH includes: HTN, DM, hyperlipidemia, fatty liver disease, prostate cancer. Current smoker. BMI 23.  Medications include: dapagliflozin, gemfibrozil, glipizide, lovastatin, metformin  Preoperative labs reviewed.   - HbA1c 8.8, glucose 260 - K 5.5, consistent with labs from PCP's office 08/03/16. Will recheck K DOS.  - Cr 1.57, BUN 31; prior labs from PCP's office 08/03/16 show Cr 1.7.   EKG 09/23/16: NSR.   If no changes, I anticipate pt can proceed with surgery as scheduled.   Willeen Cass, FNP-BC [facility] Stay Surgical [facility]/Anesthesiology Phone: [phone] 09/28/2016 10:53 AM

## 2016-10-01 MED ORDER — CEFAZOLIN SODIUM-DEXTROSE 2-4 GM/100ML-% IV SOLN
2.0000 g | INTRAVENOUS | Status: AC
  Administered 2016-10-02: 2 g via INTRAVENOUS

## 2016-10-01 NOTE — H&P (Signed)
Patient ID:   [id number] Patient: Harry Zamora  Date of Birth: 1940/11/27 Visit Type: Office Visit   Date: 09/14/2016 10:30 AM Provider: Marchia Meiers. Vertell Limber MD   This 76 year old male presents for back pain.   History of Present Illness: 1.  back pain  Trampus Mcquerry, 76 year old retired male visits for evaluation of low back and left leg pain numbness through the whole left foot. Patient notes hip and leg pain is more bothersome than the back. Patient reports the pain worsens with changing positions. He grades the pain 8/10 at it's worst in the right leg, 4-5/10 left leg, and 6/10 at it's worst on the back. Patient reports no injury, noting symptoms increasing since late 2016.  Low back pain, left leg numbness/tingling, left leg buckling at times.  Gabapentin 300 mg q.h.s. Lyrica was very helpful, but insurance changed  ESI x4 offered little relief  History:  Prostate cancer (seed implants), HTN, NIDDM Surgical history:  Seed implants 2005, left shoulder 2014, cholecystectomy 2008  MRI 2016 [place], x-ray on [facility]           MEDICATIONS(added, continued or stopped this visit): Started Medication Directions Instruction Stopped   lisinopril 20 mg tablet take 1 tablet by oral route  every day       ALLERGIES:   Review of Systems System Neg/Pos Details  Constitutional Negative Chills, Fatigue, Fever, Malaise, Night sweats, Weight gain and Weight loss.  ENMT Negative Ear drainage, Hearing loss, Nasal drainage, Otalgia, Sinus pressure and Sore throat.  Eyes Negative Eye discharge, Eye pain and Vision changes.  Respiratory Negative Chronic cough, Cough, Dyspnea, Known TB exposure and Wheezing.  Cardio Negative Chest pain, Claudication, Edema and Irregular heartbeat/palpitations.  GI Negative Abdominal pain, Blood in stool, Change in stool pattern, Constipation, Decreased appetite, Diarrhea, Heartburn, Nausea and Vomiting.  GU Negative Dribbling, Dysuria, Erectile dysfunction,  Hematuria, Polyuria (Genitourinary), Slow stream, Urinary frequency, Urinary incontinence and Urinary retention.  Endocrine Negative Cold intolerance, Heat intolerance, Polydipsia and Polyphagia.  Neuro Positive Extremity weakness, Gait disturbance, Numbness in extremity.  Psych Negative Anxiety, Depression and Insomnia.  Integumentary Negative Brittle hair, Brittle nails, Change in shape/size of mole(s), Hair loss, Hirsutism, Hives, Pruritus, Rash and Skin lesion.  MS Positive Back pain.  Hema/Lymph Negative Easy bleeding, Easy bruising and Lymphadenopathy.  Allergic/Immuno Negative Contact allergy, Environmental allergies, Food allergies and Seasonal allergies.  Reproductive Negative Penile discharge and Sexual dysfunction.   Vitals Date Temp F BP Pulse Ht In Wt Lb BMI BSA Pain Score  09/14/2016  155/75 67 74 178.8 22.96  7/10     PHYSICAL EXAM General Level of Distress: no acute distress Overall Appearance: normal  Head and Face  Right Left  Fundoscopic Exam:  normal normal    Cardiovascular Cardiac: regular rate and rhythm without murmur  Right Left  Carotid Pulses: normal normal  Respiratory Lungs: clear to auscultation  Neurological Orientation: normal Recent and Remote Memory: normal Attention Span and Concentration:   normal Language: normal Fund of Knowledge: normal  Right Left Sensation: normal normal Upper Extremity Coordination: normal normal  Lower Extremity Coordination: normal normal  Musculoskeletal Gait and Station: normal  Right Left Upper Extremity Muscle Strength: normal normal Lower Extremity Muscle Strength: normal normal Upper Extremity Muscle Tone:  normal normal Lower Extremity Muscle Tone: normal normal   Motor Strength Upper and lower extremity motor strength was tested in the clinically pertinent muscles.     Deep Tendon  Reflexes  Right Left Biceps: normal normal Triceps:  normal normal Brachioradialis: normal normal Patellar: normal normal Achilles: normal normal  Cranial Nerves II. Optic Nerve/Visual Fields: normal III. Oculomotor: normal IV. Trochlear: normal V. Trigeminal: normal VI. Abducens: normal VII. Facial: normal VIII. Acoustic/Vestibular: normal IX. Glossopharyngeal: normal X. Vagus: normal XI. Spinal Accessory: normal XII. Hypoglossal: normal  Motor and other Tests Lhermittes: negative Rhomberg: negative Pronator drift: absent     Right Left Hoffman's: normal normal Clonus: normal normal Babinski: normal normal SLR: positive normal Patrick's Corky Sox): negative negative   Additional Findings:  Left sciatic notch. Flexed attitude, leans forward. Able to extend within 12 inches of the floor. Fundoscopic exam is normal. Cranial nerve exam is normal. Full strength on upper and lower extremities. Tremor in the arms noted. Reflexes are symmetric. Positive SLR on the right. Negative SLR on the left. Negative Patrick's bilaterally.   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    IMPRESSION Patient presents with low back pain with left leg numbness through the foot. MRI shows bulging disc and spinal stenosis at L3-4, L4-5, and L5-S1; worst at L4-5. X-ray shows mild scoliosis, and arthritis throughout the lumbar spine. On confrontational testing, patient has positive SLR on the right, negative SLR on the left. Patient will be scheduled for L3-S1 laminectomy.  Physical: Left sciatic notch. Flexed attitude, leans forward. Able to extend within 12 inches of the floor. Fundoscopic exam is normal. Cranial nerve exam is normal. Full strength on upper and lower extremities. Tremor in the arms noted. Reflexes  are symmetric. Positive SLR on the right. Negative SLR on the left. Negative Patrick's bilaterally.  Completed Orders (this encounter) Order Details Reason Side Interpretation Result Initial Treatment Date Region  Lumbar Spine- AP/Lat/Obls/Spot/Flex/Ex      09/14/2016 All Levels to All Levels   Assessment/Plan # Detail Type Description   1. Assessment Lumbar radiculopathy (M54.16).           Pain Management Plan Pain Scale: 7/10. Method: Numeric Pain Intensity Scale. Location: back and leg. Onset: 09/15/2014. Pain management follow-up plan of care: Patient is taking OTC pain relievers for relief..  Fall Risk Plan The patient has not fallen in the last year.  Patient will be scheduled for L3-S1 laminectomy. Nurse education given.   Orders: Diagnostic Procedures: Assessment Procedure  M54.16 Lumbar Spine- AP/Lat/Obls/Spot/Flex/Ex             Provider:  Vertell Limber MD, Marchia Meiers 09/14/2016 11:20 AM  Dictation edited by: Lucita Lora    CC Providers: [name] [facility] [address] [place],  [place]  [id number]   Lashonne Shull MD  [address] [place], [place] [address]              Electronically signed by Marchia Meiers. Vertell Limber MD on 09/14/2016 04:47 PM

## 2016-10-02 ENCOUNTER — Ambulatory Visit (HOSPITAL_COMMUNITY): Payer: Medicare Other | Admitting: Emergency Medicine

## 2016-10-02 ENCOUNTER — Encounter (HOSPITAL_COMMUNITY): Payer: Self-pay | Admitting: Anesthesiology

## 2016-10-02 ENCOUNTER — Inpatient Hospital Stay (HOSPITAL_COMMUNITY)
Admission: RE | Admit: 2016-10-02 | Discharge: 2016-10-03 | DRG: 517 | Disposition: A | Discharge status: Home / Self Care | Payer: Medicare Other | Source: Ambulatory Visit | Attending: Neurosurgery | Admitting: Neurosurgery

## 2016-10-02 ENCOUNTER — Ambulatory Visit (HOSPITAL_COMMUNITY): Payer: Medicare Other

## 2016-10-02 ENCOUNTER — Encounter (HOSPITAL_COMMUNITY)
Admission: RE | Disposition: A | Discharge status: Home / Self Care | Payer: Self-pay | Source: Ambulatory Visit | Attending: Neurosurgery

## 2016-10-02 DIAGNOSIS — M549 Dorsalgia, unspecified: Secondary | ICD-10-CM | POA: Diagnosis present

## 2016-10-02 DIAGNOSIS — M4726 Other spondylosis with radiculopathy, lumbar region: Secondary | ICD-10-CM | POA: Diagnosis present

## 2016-10-02 DIAGNOSIS — Z923 Personal history of irradiation: Secondary | ICD-10-CM

## 2016-10-02 DIAGNOSIS — F329 Major depressive disorder, single episode, unspecified: Secondary | ICD-10-CM | POA: Diagnosis present

## 2016-10-02 DIAGNOSIS — Z8546 Personal history of malignant neoplasm of prostate: Secondary | ICD-10-CM

## 2016-10-02 DIAGNOSIS — F17200 Nicotine dependence, unspecified, uncomplicated: Secondary | ICD-10-CM | POA: Diagnosis not present

## 2016-10-02 DIAGNOSIS — M48062 Spinal stenosis, lumbar region with neurogenic claudication: Secondary | ICD-10-CM | POA: Diagnosis not present

## 2016-10-02 DIAGNOSIS — Z7984 Long term (current) use of oral hypoglycemic drugs: Secondary | ICD-10-CM

## 2016-10-02 DIAGNOSIS — Z79899 Other long term (current) drug therapy: Secondary | ICD-10-CM | POA: Diagnosis not present

## 2016-10-02 DIAGNOSIS — I10 Essential (primary) hypertension: Secondary | ICD-10-CM | POA: Diagnosis present

## 2016-10-02 DIAGNOSIS — Z419 Encounter for procedure for purposes other than remedying health state, unspecified: Secondary | ICD-10-CM

## 2016-10-02 DIAGNOSIS — G2581 Restless legs syndrome: Secondary | ICD-10-CM | POA: Diagnosis not present

## 2016-10-02 DIAGNOSIS — E119 Type 2 diabetes mellitus without complications: Secondary | ICD-10-CM | POA: Diagnosis present

## 2016-10-02 DIAGNOSIS — E785 Hyperlipidemia, unspecified: Secondary | ICD-10-CM | POA: Diagnosis present

## 2016-10-02 DIAGNOSIS — Z791 Long term (current) use of non-steroidal anti-inflammatories (NSAID): Secondary | ICD-10-CM

## 2016-10-02 DIAGNOSIS — Z9049 Acquired absence of other specified parts of digestive tract: Secondary | ICD-10-CM | POA: Diagnosis not present

## 2016-10-02 HISTORY — PX: LUMBAR LAMINECTOMY/DECOMPRESSION MICRODISCECTOMY: SHX5026

## 2016-10-02 LAB — POCT I-STAT 4, (NA,K, GLUC, HGB,HCT)
Glucose, Bld: 113 mg/dL — ABNORMAL HIGH (ref 65–99)
HCT: 43 % (ref 39.0–52.0)
HEMOGLOBIN: 14.6 g/dL (ref 13.0–17.0)
Potassium: 4.6 mmol/L (ref 3.5–5.1)
SODIUM: 139 mmol/L (ref 135–145)

## 2016-10-02 LAB — GLUCOSE, CAPILLARY
GLUCOSE-CAPILLARY: 244 mg/dL — AB (ref 65–99)
Glucose-Capillary: 78 mg/dL (ref 65–99)

## 2016-10-02 SURGERY — LUMBAR LAMINECTOMY/DECOMPRESSION MICRODISCECTOMY 3 LEVELS
Anesthesia: General | Site: Spine Lumbar

## 2016-10-02 MED ORDER — ZOLPIDEM TARTRATE 5 MG PO TABS: 5.0000 mg | ORAL_TABLET | Freq: Every evening | ORAL | Status: DC | PRN

## 2016-10-02 MED ORDER — GEMFIBROZIL 600 MG PO TABS
600.0000 mg | ORAL_TABLET | Freq: Two times a day (BID) | ORAL | Status: DC
  Administered 2016-10-03: 600 mg via ORAL
  Filled 2016-10-02: qty 1

## 2016-10-02 MED ORDER — ALUM & MAG HYDROXIDE-SIMETH 200-200-20 MG/5ML PO SUSP: 30.0000 mL | Freq: Four times a day (QID) | ORAL | Status: DC | PRN

## 2016-10-02 MED ORDER — HYDROMORPHONE HCL 1 MG/ML IJ SOLN
0.2500 mg | INTRAMUSCULAR | Status: DC | PRN
Start: 2016-10-02 — End: 2016-10-02
  Administered 2016-10-02: 0.5 mg via INTRAVENOUS

## 2016-10-02 MED ORDER — THROMBIN 5000 UNITS EX SOLR
CUTANEOUS | Status: AC
  Filled 2016-10-02: qty 5000

## 2016-10-02 MED ORDER — PHENOL 1.4 % MT LIQD: 1.0000 | OROMUCOSAL | Status: DC | PRN

## 2016-10-02 MED ORDER — INSULIN ASPART 100 UNIT/ML ~~LOC~~ SOLN: 0.0000 [IU] | Freq: Three times a day (TID) | SUBCUTANEOUS | Status: DC

## 2016-10-02 MED ORDER — VITAMIN D3 25 MCG (1000 UNIT) PO TABS
1000.0000 [IU] | ORAL_TABLET | Freq: Every day | ORAL | Status: DC
  Administered 2016-10-03: 1000 [IU] via ORAL
  Filled 2016-10-02 (×2): qty 1

## 2016-10-02 MED ORDER — ROCURONIUM BROMIDE 50 MG/5ML IV SOLN
INTRAVENOUS | Status: AC
  Filled 2016-10-02: qty 1

## 2016-10-02 MED ORDER — PROPOFOL 10 MG/ML IV BOLUS
INTRAVENOUS | Status: AC
  Filled 2016-10-02: qty 20

## 2016-10-02 MED ORDER — LIDOCAINE HCL (PF) 1 % IJ SOLN
INTRAMUSCULAR | Status: AC
  Filled 2016-10-02: qty 30

## 2016-10-02 MED ORDER — ACETAMINOPHEN 500 MG PO TABS: 1000.0000 mg | ORAL_TABLET | Freq: Every day | ORAL | Status: DC

## 2016-10-02 MED ORDER — PROMETHAZINE HCL 25 MG/ML IJ SOLN: 6.2500 mg | INTRAMUSCULAR | Status: DC | PRN

## 2016-10-02 MED ORDER — FENTANYL CITRATE (PF) 100 MCG/2ML IJ SOLN
INTRAMUSCULAR | Status: DC | PRN
  Administered 2016-10-02: 100 ug via INTRAVENOUS
  Administered 2016-10-02 (×3): 50 ug via INTRAVENOUS

## 2016-10-02 MED ORDER — PANTOPRAZOLE SODIUM 40 MG PO TBEC
40.0000 mg | DELAYED_RELEASE_TABLET | Freq: Every day | ORAL | Status: DC
  Administered 2016-10-02: 40 mg via ORAL
  Filled 2016-10-02: qty 1

## 2016-10-02 MED ORDER — SUGAMMADEX SODIUM 200 MG/2ML IV SOLN
INTRAVENOUS | Status: DC | PRN
  Administered 2016-10-02: 160 mg via INTRAVENOUS

## 2016-10-02 MED ORDER — CANAGLIFLOZIN 100 MG PO TABS
100.0000 mg | ORAL_TABLET | Freq: Every day | ORAL | Status: DC
  Administered 2016-10-03: 100 mg via ORAL
  Filled 2016-10-02: qty 1

## 2016-10-02 MED ORDER — DOCUSATE SODIUM 100 MG PO CAPS
100.0000 mg | ORAL_CAPSULE | Freq: Two times a day (BID) | ORAL | Status: DC
  Administered 2016-10-02 – 2016-10-03 (×2): 100 mg via ORAL
  Filled 2016-10-02 (×2): qty 1

## 2016-10-02 MED ORDER — LACTATED RINGERS IV SOLN
INTRAVENOUS | Status: DC | PRN
  Administered 2016-10-02 (×2): via INTRAVENOUS

## 2016-10-02 MED ORDER — SODIUM CHLORIDE 0.9% FLUSH: 3.0000 mL | Freq: Two times a day (BID) | INTRAVENOUS | Status: DC

## 2016-10-02 MED ORDER — LIDOCAINE-EPINEPHRINE 1 %-1:100000 IJ SOLN
INTRAMUSCULAR | Status: DC | PRN
  Administered 2016-10-02: 5 mL

## 2016-10-02 MED ORDER — ROCURONIUM BROMIDE 10 MG/ML (PF) SYRINGE
PREFILLED_SYRINGE | INTRAVENOUS | Status: DC | PRN
  Administered 2016-10-02 (×2): 10 mg via INTRAVENOUS
  Administered 2016-10-02: 50 mg via INTRAVENOUS

## 2016-10-02 MED ORDER — ONDANSETRON HCL 4 MG/2ML IJ SOLN
INTRAMUSCULAR | Status: DC | PRN
  Administered 2016-10-02: 4 mg via INTRAVENOUS

## 2016-10-02 MED ORDER — METHOCARBAMOL 500 MG PO TABS
500.0000 mg | ORAL_TABLET | Freq: Four times a day (QID) | ORAL | Status: DC | PRN
  Administered 2016-10-02 – 2016-10-03 (×3): 500 mg via ORAL
  Filled 2016-10-02 (×3): qty 1

## 2016-10-02 MED ORDER — ROPINIROLE HCL 1 MG PO TABS
2.0000 mg | ORAL_TABLET | Freq: Every day | ORAL | Status: DC
  Administered 2016-10-02: 2 mg via ORAL
  Filled 2016-10-02: qty 2

## 2016-10-02 MED ORDER — PANTOPRAZOLE SODIUM 40 MG IV SOLR: 40.0000 mg | Freq: Every day | INTRAVENOUS | Status: DC

## 2016-10-02 MED ORDER — SODIUM CHLORIDE 0.9% FLUSH: 3.0000 mL | INTRAVENOUS | Status: DC | PRN

## 2016-10-02 MED ORDER — BUPIVACAINE-EPINEPHRINE (PF) 0.5% -1:200000 IJ SOLN
INTRAMUSCULAR | Status: AC
  Filled 2016-10-02: qty 30

## 2016-10-02 MED ORDER — CHLORHEXIDINE GLUCONATE CLOTH 2 % EX PADS: 6.0000 | MEDICATED_PAD | Freq: Once | CUTANEOUS | Status: DC

## 2016-10-02 MED ORDER — STERILE WATER FOR IRRIGATION IR SOLN
Status: DC | PRN
  Administered 2016-10-02: 1000 mL

## 2016-10-02 MED ORDER — METHOCARBAMOL 1000 MG/10ML IJ SOLN
500.0000 mg | Freq: Four times a day (QID) | INTRAVENOUS | Status: DC | PRN
  Filled 2016-10-02: qty 5

## 2016-10-02 MED ORDER — DEXAMETHASONE SODIUM PHOSPHATE 10 MG/ML IJ SOLN
INTRAMUSCULAR | Status: AC
  Filled 2016-10-02: qty 3

## 2016-10-02 MED ORDER — HYDROMORPHONE HCL 1 MG/ML IJ SOLN
INTRAMUSCULAR | Status: AC
  Filled 2016-10-02: qty 0.5

## 2016-10-02 MED ORDER — 0.9 % SODIUM CHLORIDE (POUR BTL) OPTIME
TOPICAL | Status: DC | PRN
  Administered 2016-10-02: 1000 mL

## 2016-10-02 MED ORDER — ONDANSETRON HCL 4 MG PO TABS: 4.0000 mg | ORAL_TABLET | Freq: Four times a day (QID) | ORAL | Status: DC | PRN

## 2016-10-02 MED ORDER — PROPOFOL 10 MG/ML IV BOLUS
INTRAVENOUS | Status: DC | PRN
  Administered 2016-10-02: 30 mg via INTRAVENOUS
  Administered 2016-10-02: 150 mg via INTRAVENOUS
  Administered 2016-10-02: 10 mg via INTRAVENOUS

## 2016-10-02 MED ORDER — BISACODYL 10 MG RE SUPP
10.0000 mg | Freq: Every day | RECTAL | Status: DC | PRN
Start: 2016-10-02 — End: 2016-10-03

## 2016-10-02 MED ORDER — KCL IN DEXTROSE-NACL 20-5-0.45 MEQ/L-%-% IV SOLN: INTRAVENOUS | Status: DC

## 2016-10-02 MED ORDER — LACTATED RINGERS IV SOLN: INTRAVENOUS | Status: DC

## 2016-10-02 MED ORDER — VITAMIN B-12 1000 MCG PO TABS
1000.0000 ug | ORAL_TABLET | Freq: Every day | ORAL | Status: DC
  Administered 2016-10-03: 1000 ug via ORAL
  Filled 2016-10-02: qty 1

## 2016-10-02 MED ORDER — LIDOCAINE 2% (20 MG/ML) 5 ML SYRINGE
INTRAMUSCULAR | Status: DC | PRN
  Administered 2016-10-02: 60 mg via INTRAVENOUS

## 2016-10-02 MED ORDER — CEFAZOLIN SODIUM-DEXTROSE 2-4 GM/100ML-% IV SOLN
2.0000 g | Freq: Three times a day (TID) | INTRAVENOUS | Status: AC
  Administered 2016-10-02 – 2016-10-03 (×2): 2 g via INTRAVENOUS
  Filled 2016-10-02 (×2): qty 100

## 2016-10-02 MED ORDER — LACTATED RINGERS IV SOLN
INTRAVENOUS | Status: DC
  Administered 2016-10-02: 50 mL/h via INTRAVENOUS

## 2016-10-02 MED ORDER — VITAMIN C 500 MG PO TABS
500.0000 mg | ORAL_TABLET | Freq: Every day | ORAL | Status: DC
  Administered 2016-10-03: 500 mg via ORAL
  Filled 2016-10-02: qty 1

## 2016-10-02 MED ORDER — BUPROPION HCL ER (XL) 150 MG PO TB24
150.0000 mg | ORAL_TABLET | Freq: Every day | ORAL | Status: DC
  Administered 2016-10-03: 150 mg via ORAL
  Filled 2016-10-02: qty 1

## 2016-10-02 MED ORDER — SODIUM CHLORIDE 0.9 % IV SOLN: 250.0000 mL | INTRAVENOUS | Status: DC

## 2016-10-02 MED ORDER — ACETAMINOPHEN 325 MG PO TABS: 650.0000 mg | ORAL_TABLET | ORAL | Status: DC | PRN

## 2016-10-02 MED ORDER — ONDANSETRON HCL 4 MG/2ML IJ SOLN
INTRAMUSCULAR | Status: AC
  Filled 2016-10-02: qty 2

## 2016-10-02 MED ORDER — MEPERIDINE HCL 25 MG/ML IJ SOLN: 6.2500 mg | INTRAMUSCULAR | Status: DC | PRN

## 2016-10-02 MED ORDER — MENTHOL 3 MG MT LOZG: 1.0000 | LOZENGE | OROMUCOSAL | Status: DC | PRN

## 2016-10-02 MED ORDER — METFORMIN HCL 500 MG PO TABS
1000.0000 mg | ORAL_TABLET | Freq: Two times a day (BID) | ORAL | Status: DC
  Administered 2016-10-03: 1000 mg via ORAL
  Filled 2016-10-02: qty 2

## 2016-10-02 MED ORDER — ONDANSETRON HCL 4 MG/2ML IJ SOLN: 4.0000 mg | Freq: Four times a day (QID) | INTRAMUSCULAR | Status: DC | PRN

## 2016-10-02 MED ORDER — FLEET ENEMA 7-19 GM/118ML RE ENEM: 1.0000 | ENEMA | Freq: Once | RECTAL | Status: DC | PRN

## 2016-10-02 MED ORDER — BUPIVACAINE HCL (PF) 0.5 % IJ SOLN
INTRAMUSCULAR | Status: DC | PRN
  Administered 2016-10-02: 5 mL

## 2016-10-02 MED ORDER — GLIPIZIDE 5 MG PO TABS
5.0000 mg | ORAL_TABLET | Freq: Every day | ORAL | Status: DC
  Administered 2016-10-03: 5 mg via ORAL
  Filled 2016-10-02: qty 1

## 2016-10-02 MED ORDER — PRAVASTATIN SODIUM 10 MG PO TABS: 10.0000 mg | ORAL_TABLET | Freq: Every day | ORAL | Status: DC

## 2016-10-02 MED ORDER — HYDROCODONE-ACETAMINOPHEN 5-325 MG PO TABS
1.0000 | ORAL_TABLET | ORAL | Status: DC | PRN
  Administered 2016-10-02 – 2016-10-03 (×4): 2 via ORAL
  Filled 2016-10-02 (×4): qty 2

## 2016-10-02 MED ORDER — PHENYLEPHRINE HCL 10 MG/ML IJ SOLN
INTRAVENOUS | Status: DC | PRN
  Administered 2016-10-02: 20 ug/min via INTRAVENOUS

## 2016-10-02 MED ORDER — ACETAMINOPHEN 650 MG RE SUPP
650.0000 mg | RECTAL | Status: DC | PRN
Start: 2016-10-02 — End: 2016-10-03

## 2016-10-02 MED ORDER — FENTANYL CITRATE (PF) 250 MCG/5ML IJ SOLN
INTRAMUSCULAR | Status: AC
  Filled 2016-10-02: qty 5

## 2016-10-02 MED ORDER — INSULIN ASPART 100 UNIT/ML ~~LOC~~ SOLN
0.0000 [IU] | Freq: Every day | SUBCUTANEOUS | Status: DC
  Administered 2016-10-02: 2 [IU] via SUBCUTANEOUS

## 2016-10-02 MED ORDER — MELOXICAM 7.5 MG PO TABS
15.0000 mg | ORAL_TABLET | Freq: Every day | ORAL | Status: DC
  Administered 2016-10-03: 15 mg via ORAL
  Filled 2016-10-02: qty 2

## 2016-10-02 MED ORDER — MIDAZOLAM HCL 2 MG/2ML IJ SOLN
INTRAMUSCULAR | Status: AC
  Filled 2016-10-02: qty 2

## 2016-10-02 MED ORDER — SENNOSIDES-DOCUSATE SODIUM 8.6-50 MG PO TABS: 1.0000 | ORAL_TABLET | Freq: Every evening | ORAL | Status: DC | PRN

## 2016-10-02 MED ORDER — GABAPENTIN 300 MG PO CAPS
600.0000 mg | ORAL_CAPSULE | Freq: Every day | ORAL | Status: DC
  Administered 2016-10-03: 600 mg via ORAL
  Filled 2016-10-02: qty 2

## 2016-10-02 MED ORDER — THROMBIN 5000 UNITS EX SOLR
CUTANEOUS | Status: DC | PRN
  Administered 2016-10-02: 10000 [IU] via TOPICAL

## 2016-10-02 MED ORDER — MORPHINE SULFATE (PF) 4 MG/ML IV SOLN: 2.0000 mg | INTRAVENOUS | Status: DC | PRN

## 2016-10-02 SURGICAL SUPPLY — 51 items
ADH SKN CLS APL DERMABOND .7 (GAUZE/BANDAGES/DRESSINGS) ×1
BIT DRILL NEURO 2X3.1 SFT TUCH (MISCELLANEOUS) ×1 IMPLANT
BLADE CLIPPER SURG (BLADE) IMPLANT
BUR ROUND FLUTED 5 RND (BURR) ×2 IMPLANT
CANISTER SUCT 3000ML PPV (MISCELLANEOUS) ×2 IMPLANT
CARTRIDGE OIL MAESTRO DRILL (MISCELLANEOUS) ×1 IMPLANT
DECANTER SPIKE VIAL GLASS SM (MISCELLANEOUS) ×2 IMPLANT
DERMABOND ADVANCED (GAUZE/BANDAGES/DRESSINGS) ×1
DERMABOND ADVANCED .7 DNX12 (GAUZE/BANDAGES/DRESSINGS) ×1 IMPLANT
DIFFUSER DRILL AIR PNEUMATIC (MISCELLANEOUS) ×2 IMPLANT
DRAPE LAPAROTOMY 100X72X124 (DRAPES) ×2 IMPLANT
DRAPE MICROSCOPE LEICA (MISCELLANEOUS) ×2 IMPLANT
DRAPE POUCH INSTRU U-SHP 10X18 (DRAPES) ×2 IMPLANT
DRILL NEURO 2X3.1 SOFT TOUCH (MISCELLANEOUS) ×2
DRSG OPSITE POSTOP 4X8 (GAUZE/BANDAGES/DRESSINGS) ×1 IMPLANT
DURAPREP 26ML APPLICATOR (WOUND CARE) ×2 IMPLANT
ELECT REM PT RETURN 9FT ADLT (ELECTROSURGICAL) ×2
ELECTRODE REM PT RTRN 9FT ADLT (ELECTROSURGICAL) ×1 IMPLANT
GAUZE SPONGE 4X4 12PLY STRL (GAUZE/BANDAGES/DRESSINGS) IMPLANT
GAUZE SPONGE 4X4 16PLY XRAY LF (GAUZE/BANDAGES/DRESSINGS) IMPLANT
GLOVE BIO SURGEON STRL SZ8 (GLOVE) ×2 IMPLANT
GLOVE BIOGEL PI IND STRL 8 (GLOVE) ×1 IMPLANT
GLOVE BIOGEL PI IND STRL 8.5 (GLOVE) ×1 IMPLANT
GLOVE BIOGEL PI INDICATOR 8 (GLOVE) ×1
GLOVE BIOGEL PI INDICATOR 8.5 (GLOVE) ×1
GLOVE ECLIPSE 8.0 STRL XLNG CF (GLOVE) ×2 IMPLANT
GLOVE EXAM NITRILE LRG STRL (GLOVE) IMPLANT
GLOVE EXAM NITRILE XL STR (GLOVE) IMPLANT
GLOVE EXAM NITRILE XS STR PU (GLOVE) IMPLANT
GOWN STRL REUS W/ TWL LRG LVL3 (GOWN DISPOSABLE) IMPLANT
GOWN STRL REUS W/ TWL XL LVL3 (GOWN DISPOSABLE) IMPLANT
GOWN STRL REUS W/TWL 2XL LVL3 (GOWN DISPOSABLE) IMPLANT
GOWN STRL REUS W/TWL LRG LVL3 (GOWN DISPOSABLE)
GOWN STRL REUS W/TWL XL LVL3 (GOWN DISPOSABLE)
KIT BASIN OR (CUSTOM PROCEDURE TRAY) ×2 IMPLANT
KIT ROOM TURNOVER OR (KITS) ×2 IMPLANT
NDL HYPO 25X1 1.5 SAFETY (NEEDLE) ×1 IMPLANT
NEEDLE HYPO 25X1 1.5 SAFETY (NEEDLE) ×2 IMPLANT
NS IRRIG 1000ML POUR BTL (IV SOLUTION) ×2 IMPLANT
OIL CARTRIDGE MAESTRO DRILL (MISCELLANEOUS) ×2
PACK LAMINECTOMY NEURO (CUSTOM PROCEDURE TRAY) ×2 IMPLANT
PAD ARMBOARD 7.5X6 YLW CONV (MISCELLANEOUS) ×6 IMPLANT
SPONGE SURGIFOAM ABS GEL SZ50 (HEMOSTASIS) IMPLANT
STAPLER SKIN PROX WIDE 3.9 (STAPLE) IMPLANT
SUT VIC AB 0 CT1 18XCR BRD8 (SUTURE) ×1 IMPLANT
SUT VIC AB 0 CT1 8-18 (SUTURE) ×2
SUT VIC AB 2-0 CT1 18 (SUTURE) ×2 IMPLANT
SUT VIC AB 3-0 SH 8-18 (SUTURE) ×2 IMPLANT
TOWEL GREEN STERILE (TOWEL DISPOSABLE) ×2 IMPLANT
TOWEL GREEN STERILE FF (TOWEL DISPOSABLE) ×2 IMPLANT
WATER STERILE IRR 1000ML POUR (IV SOLUTION) ×2 IMPLANT

## 2016-10-02 NOTE — Anesthesia Postprocedure Evaluation (Signed)
Anesthesia Post Note  Patient: Raliegh Scobie [facility]  Procedure(s) Performed: Procedure(s) (LRB): L3 to S1 Laminectomy (N/A)     Patient location during evaluation: PACU Anesthesia Type: General Level of consciousness: awake and alert Pain management: pain level controlled Vital Signs Assessment: post-procedure vital signs reviewed and stable Respiratory status: spontaneous breathing, nonlabored ventilation, respiratory function stable and patient connected to nasal cannula oxygen Cardiovascular status: blood pressure returned to baseline and stable Postop Assessment: no signs of nausea or vomiting Anesthetic complications: no    Last Vitals:  Vitals:   10/02/16 1800 10/02/16 1813  BP: (!) 150/75   Pulse: (!) 59 67  Resp: 15 18  Temp:  (!) 36.3 C    Last Pain:  Vitals:   10/02/16 1813  PainSc: 0-No pain    LLE Motor Response: Purposeful movement;Responds to commands (10/02/16 1813) LLE Sensation: Full sensation (10/02/16 1813) RLE Motor Response: Purposeful movement;Responds to commands (10/02/16 1813) RLE Sensation: Full sensation (10/02/16 1813)      Effie Berkshire

## 2016-10-02 NOTE — Anesthesia Preprocedure Evaluation (Addendum)
Anesthesia Evaluation  Patient identified by MRN, date of birth, ID band Patient awake    Reviewed: Allergy & Precautions, NPO status , Patient's Chart, lab work & pertinent test results  Airway Mallampati: IV  TM Distance: <3 FB     Dental  (+) Partial Lower, Dental Advisory Given   Pulmonary neg pulmonary ROS, Current Smoker,    breath sounds clear to auscultation       Cardiovascular hypertension, negative cardio ROS   Rhythm:Regular Rate:Normal     Neuro/Psych PSYCHIATRIC DISORDERS Depression negative neurological ROS     GI/Hepatic negative GI ROS, Neg liver ROS,   Endo/Other  diabetes, Type 2, Oral Hypoglycemic Agents  Renal/GU negative Renal ROS     Musculoskeletal  (+) Arthritis ,   Abdominal   Peds  Hematology negative hematology ROS (+)   Anesthesia Other Findings - HLD - RLS - Prostate Cancer  Reproductive/Obstetrics                            Lab Results  Component Value Date   WBC 5.3 09/23/2016   HGB 14.6 10/02/2016   HCT 43.0 10/02/2016   MCV 94.0 09/23/2016   PLT 198 09/23/2016   Lab Results  Component Value Date   CREATININE 1.57 (H) 09/23/2016   BUN 31 (H) 09/23/2016   NA 139 10/02/2016   K 4.6 10/02/2016   CL 105 09/23/2016   CO2 24 09/23/2016   No results found for: INR, PROTIME  EKG: normal sinus rhythm.  Anesthesia Physical Anesthesia Plan  ASA: II  Anesthesia Plan: General   Post-op Pain Management:    Induction: Intravenous  PONV Risk Score and Plan: 2 and Ondansetron and Dexamethasone  Airway Management Planned: Oral ETT  Additional Equipment:   Intra-op Plan:   Post-operative Plan: Extubation in OR  Informed Consent: I have reviewed the patients History and Physical, chart, labs and discussed the procedure including the risks, benefits and alternatives for the proposed anesthesia with the patient or authorized representative who  has indicated his/her understanding and acceptance.   Dental advisory given  Plan Discussed with: CRNA  Anesthesia Plan Comments:         Anesthesia Quick Evaluation

## 2016-10-02 NOTE — Anesthesia Procedure Notes (Signed)
Procedure Name: Intubation Date/Time: 10/02/2016 2:47 PM Performed by: Everlean Cherry A Pre-anesthesia Checklist: Patient identified, Emergency Drugs available, Suction available and Patient being monitored Patient Re-evaluated:Patient Re-evaluated prior to induction Oxygen Delivery Method: Circle system utilized Preoxygenation: Pre-oxygenation with 100% oxygen Induction Type: IV induction Ventilation: Mask ventilation without difficulty and Oral airway inserted - appropriate to patient size Laryngoscope Size: Glidescope and 4 Grade View: Grade I Tube type: Oral Tube size: 8.0 mm Number of attempts: 2 Airway Equipment and Method: Rigid stylet and Video-laryngoscopy Placement Confirmation: ETT inserted through vocal cords under direct vision,  positive ETCO2 and breath sounds checked- equal and bilateral Secured at: 24 cm Tube secured with: Tape Dental Injury: Teeth and Oropharynx as per pre-operative assessment  Difficulty Due To: Difficulty was anticipated and Difficult Airway- due to anterior larynx Comments: DL x1 with Mil 2.  Unable to visualize beyond epiglottis due to anterior airway.  Positive BMV with OA.  DL x2 with glidescope 4.  Successful placement of ETT.  EBBS and VSS.

## 2016-10-02 NOTE — Interval H&P Note (Signed)
History and Physical Interval Note:  10/02/2016 2:12 PM  Harry Zamora [facility]  has presented today for surgery, with the diagnosis of Lumbar stenosis with neurogenic claudication  The various methods of treatment have been discussed with the patient and family. After consideration of risks, benefits and other options for treatment, the patient has consented to  Procedure(s) with comments: L3 to S1 Laminectomy (N/A) - L3 to S1 Laminectomy as a surgical intervention .  The patient's history has been reviewed, patient examined, no change in status, stable for surgery.  I have reviewed the patient's chart and labs.  Questions were answered to the patient's satisfaction.     Vidur Knust D

## 2016-10-02 NOTE — Op Note (Signed)
10/02/2016  4:40 PM  PATIENT:  Harry Zamora  76 y.o. male  PRE-OPERATIVE DIAGNOSIS:  Lumbar stenosis with neurogenic claudication, L 34, L 45, L 5 S 1, lumbago, spondylosis, radiculopathy  POST-OPERATIVE DIAGNOSIS:   Lumbar stenosis with neurogenic claudication, L 34, L 45, L 5 S 1, lumbago, spondylosis, radiculopathy  PROCEDURE:  Procedure(s) with comments: L3 to S1 Laminectomy (N/A) - L3 to S1 Laminectomy  SURGEON:  Surgeon(s) and Role:    Erline Levine, MD - Primary  PHYSICIAN ASSISTANT:   ASSISTANTS: Poteat, RN   ANESTHESIA:   general  EBL:  Total I/O In: 1000 [I.V.:1000] Out: 150 [Blood:150]  BLOOD ADMINISTERED:none  DRAINS: none   LOCAL MEDICATIONS USED:  MARCAINE    and LIDOCAINE   SPECIMEN:  No Specimen  DISPOSITION OF SPECIMEN:  N/A  COUNTS:  YES  TOURNIQUET:  * No tourniquets in log *  DICTATION: DICTATION: Patient has severe spinal stenosis at L 34, L 45, L 5 S 1 with left leg pain and neurogenic claudication. It was elected to take him to surgery for L 3 through S 1 laminectomy.  Procedure: Patient was brought to the operating room and following the smooth and uncomplicated induction of general endotracheal anesthesia she was placed in a prone position on the Wilson frame. Low back was prepped and draped in the usual sterile fashion with betadine scrub and DuraPrep. Area of planned incision was infiltrated with local lidocaine. Incision was made in the midline and carried to the lumbodorsal fascia which was incised on both sides of midline. Subperiosteal dissection was performed exposing what was felt to be L 3 - S 1 levels. Intraoperative x-ray demonstrated marker probes at L 45 and L 5 S 1.   A total laminectomy of L 3, L 4 and L 5 was performed with leksell, then a high-speed drill and completed with Kerrison rongeurs and generous foraminotomies were performed to decompress the L 3, L 4, L 5, and S 1 nerves bilaterally. Ligamentum flavum was detached and  removed in a piecemeal fashion and the left  L 4 and L 5 nerve roots were decompressed laterally with removal of the superior aspect of the facet and ligamentum causing nerve root compression.  At this point it was felt that all neural elements were well decompressed. The wound was then irrigated with saline. Hemostasis was assured with Surgifoam. The lumbodorsal fascia was closed with 0 Vicryl sutures the subcutaneous tissues reapproximated 2-0 Vicryl inverted sutures and the skin edges were reapproximated with 3-0 Vicryl subcuticular stitch. The wound is dressed with Dermabond and an occlusive dressing. Patient was extubated in the operating room and taken to recovery in stable and satisfactory condition having tolerated his operation well counts were correct at the end of the case.    PLAN OF CARE: Admit to inpatient   PATIENT DISPOSITION:  PACU - hemodynamically stable.   Delay start of Pharmacological VTE agent (>24hrs) due to surgical blood loss or risk of bleeding: yes

## 2016-10-02 NOTE — Progress Notes (Signed)
Awake, alert, conversant.  MAEW with good strength.  Numbness resolved.  Minmal pain.  Doing well.

## 2016-10-02 NOTE — Transfer of Care (Signed)
Immediate Anesthesia Transfer of Care Note  Patient: Harry Zamora [facility]  Procedure(s) Performed: Procedure(s) with comments: L3 to S1 Laminectomy (N/A) - L3 to S1 Laminectomy  Patient Location: PACU  Anesthesia Type:General  Level of Consciousness: drowsy and patient cooperative  Airway & Oxygen Therapy: Patient Spontanous Breathing  Post-op Assessment: Report given to RN and Post -op Vital signs reviewed and stable  Post vital signs: Reviewed and stable  Last Vitals:  Vitals:   10/02/16 1203 10/02/16 1650  BP: (!) 158/82 (!) 147/78  Pulse: 67 63  Resp: 20 12  Temp: 36.5 C (!) 36.3 C    Last Pain:  Vitals:   10/02/16 1650  PainSc: Asleep      Patients Stated Pain Goal: 3 (50/01/64 2903)  Complications: No apparent anesthesia complications

## 2016-10-02 NOTE — Brief Op Note (Signed)
10/02/2016  4:40 PM  PATIENT:  Harry Zamora  76 y.o. male  PRE-OPERATIVE DIAGNOSIS:  Lumbar stenosis with neurogenic claudication, L 34, L 45, L 5 S 1, lumbago, spondylosis, radiculopathy  POST-OPERATIVE DIAGNOSIS:   Lumbar stenosis with neurogenic claudication, L 34, L 45, L 5 S 1, lumbago, spondylosis, radiculopathy  PROCEDURE:  Procedure(s) with comments: L3 to S1 Laminectomy (N/A) - L3 to S1 Laminectomy  SURGEON:  Surgeon(s) and Role:    Erline Levine, MD - Primary  PHYSICIAN ASSISTANT:   ASSISTANTS: Poteat, RN   ANESTHESIA:   general  EBL:  Total I/O In: 1000 [I.V.:1000] Out: 150 [Blood:150]  BLOOD ADMINISTERED:none  DRAINS: none   LOCAL MEDICATIONS USED:  MARCAINE    and LIDOCAINE   SPECIMEN:  No Specimen  DISPOSITION OF SPECIMEN:  N/A  COUNTS:  YES  TOURNIQUET:  * No tourniquets in log *  DICTATION: DICTATION: Patient has severe spinal stenosis at L 34, L 45, L 5 S 1 with left leg pain and neurogenic claudication. It was elected to take him to surgery for L 3 through S 1 laminectomy.  Procedure: Patient was brought to the operating room and following the smooth and uncomplicated induction of general endotracheal anesthesia she was placed in a prone position on the Wilson frame. Low back was prepped and draped in the usual sterile fashion with betadine scrub and DuraPrep. Area of planned incision was infiltrated with local lidocaine. Incision was made in the midline and carried to the lumbodorsal fascia which was incised on both sides of midline. Subperiosteal dissection was performed exposing what was felt to be L 3 - S 1 levels. Intraoperative x-ray demonstrated marker probes at L 45 and L 5 S 1.   A total laminectomy of L 3, L 4 and L 5 was performed with leksell, then a high-speed drill and completed with Kerrison rongeurs and generous foraminotomies were performed to decompress the L 3, L 4, L 5, and S 1 nerves bilaterally. Ligamentum flavum was detached and  removed in a piecemeal fashion and the left  L 4 and L 5 nerve roots were decompressed laterally with removal of the superior aspect of the facet and ligamentum causing nerve root compression.  At this point it was felt that all neural elements were well decompressed. The wound was then irrigated with saline. Hemostasis was assured with Surgifoam. The lumbodorsal fascia was closed with 0 Vicryl sutures the subcutaneous tissues reapproximated 2-0 Vicryl inverted sutures and the skin edges were reapproximated with 3-0 Vicryl subcuticular stitch. The wound is dressed with Dermabond and an occlusive dressing. Patient was extubated in the operating room and taken to recovery in stable and satisfactory condition having tolerated his operation well counts were correct at the end of the case.    PLAN OF CARE: Admit to inpatient   PATIENT DISPOSITION:  PACU - hemodynamically stable.   Delay start of Pharmacological VTE agent (>24hrs) due to surgical blood loss or risk of bleeding: yes

## 2016-10-03 ENCOUNTER — Encounter (HOSPITAL_COMMUNITY): Payer: Self-pay | Admitting: Neurosurgery

## 2016-10-03 DIAGNOSIS — M48062 Spinal stenosis, lumbar region with neurogenic claudication: Secondary | ICD-10-CM | POA: Diagnosis not present

## 2016-10-03 DIAGNOSIS — M549 Dorsalgia, unspecified: Secondary | ICD-10-CM | POA: Diagnosis not present

## 2016-10-03 LAB — GLUCOSE, CAPILLARY: GLUCOSE-CAPILLARY: 108 mg/dL — AB (ref 65–99)

## 2016-10-03 MED ORDER — HYDROCODONE-ACETAMINOPHEN 5-325 MG PO TABS: 1.0000 | ORAL_TABLET | ORAL | 0 refills | Status: DC | PRN

## 2016-10-03 MED ORDER — METHOCARBAMOL 500 MG PO TABS: 500.0000 mg | ORAL_TABLET | Freq: Four times a day (QID) | ORAL | 1 refills | Status: DC | PRN

## 2016-10-03 NOTE — Progress Notes (Signed)
Patient is discharged from room 3C08 at this time. Alert and in stable condition. IV site d/c'd and instructions given to patient and wife with understanding verbalized. Left unit via wheelchair with all belongings at side.

## 2016-10-03 NOTE — Evaluation (Addendum)
Occupational Therapy Evaluation/Discharge Patient Details Name: Harry Zamora MRN: [id number] DOB: 1940-06-03 Today's Date: 10/03/2016    History of Present Illness 76 yo admitted for L3-S1 laminectomy. PMHx: prostate CA, HTN, DM   Clinical Impression   PTA, pt was independent with ADL and functional mobility. He currently requires supervision for toilet and tub transfers and min assist for LB ADL while adhering to back precautions. Pt's wife demonstrates ability to provide all necessary assistance. Back handout provided and reviewed ADLs in detail. Pt educated on: clothing between brace, compensatory strategies for dressing/bathing/toileting, correct bed positioning for sleeping, correct sequence for bed mobility, and never wash directly over incision. All education is complete and patient and his wife indicate understanding. No further OT needs identified. Recommend 3-in-1 for DME needs. OT will sign off.       Follow Up Recommendations  No OT follow up;Supervision/Assistance - 24 hour    Equipment Recommendations  3 in 1 bedside commode    Recommendations for Other Services       Precautions / Restrictions Precautions Precautions: Back Precaution Booklet Issued: No Precaution Comments: Educated pt and wife on back precautions related to ADL. Handout provided by PT.  Restrictions Weight Bearing Restrictions: No      Mobility Bed Mobility Overal bed mobility: Needs Assistance Bed Mobility: Rolling;Sidelying to Sit;Sit to Sidelying Rolling: Supervision Sidelying to sit: Supervision     Sit to sidelying: Supervision General bed mobility comments: Supervision and VC's for safe log roll technique.   Transfers Overall transfer level: Needs assistance Equipment used: None Transfers: Sit to/from Stand Sit to Stand: Supervision         General transfer comment: Supervision for safety.     Balance Overall balance assessment: No apparent balance deficits (not formally  assessed)                                         ADL either performed or assessed with clinical judgement   ADL Overall ADL's : Needs assistance/impaired Eating/Feeding: Set up;Sitting   Grooming: Supervision/safety;Standing   Upper Body Bathing: Set up;Sitting   Lower Body Bathing: Minimal assistance;With caregiver independent assisting;Sit to/from stand   Upper Body Dressing : Set up;Sitting   Lower Body Dressing: Minimal assistance;Sit to/from stand   Toilet Transfer: Ambulation;BSC;Supervision/safety   Toileting- Water quality scientist and Hygiene: Sit to/from stand;Supervision/safety   Tub/ Banker: Ambulation;Tub transfer;3 in 1;Supervision/safety   Functional mobility during ADLs: Supervision/safety General ADL Comments: Educated pt and wife concerning strategies for dressing, bathing, and toileting tasks to maximize safety and adherance to back precautions.      Vision Patient Visual Report: No change from baseline Vision Assessment?: No apparent visual deficits     Perception     Praxis      Pertinent Vitals/Pain Pain Assessment: Faces Pain Score: 8  Faces Pain Scale: Hurts little more Pain Location: incision Pain Descriptors / Indicators: Aching Pain Intervention(s): Limited activity within patient's tolerance;Monitored during session;Repositioned     Hand Dominance     Extremity/Trunk Assessment Upper Extremity Assessment Upper Extremity Assessment: Overall WFL for tasks assessed   Lower Extremity Assessment Lower Extremity Assessment: Defer to PT evaluation   Cervical / Trunk Assessment Cervical / Trunk Assessment: Other exceptions Cervical / Trunk Exceptions: post surgical   Communication Communication Communication: No difficulties   Cognition Arousal/Alertness: Awake/alert Behavior During Therapy: WFL for tasks assessed/performed Overall Cognitive Status: Within  Functional Limits for tasks assessed                                      General Comments       Exercises     Shoulder Instructions      Home Living Family/patient expects to be discharged to:: Private residence Living Arrangements: Spouse/significant other Available Help at Discharge: Family Type of Home: House Home Access: Stairs to enter [employer] of Steps: 1   Home Layout: One level     Bathroom Shower/Tub: Tub/shower unit;Door (plan to remove door and add curtain)   Bathroom Toilet: Standard     Home Equipment: None          Prior Functioning/Environment Level of Independence: Independent        Comments: works part time        OT Problem List: Decreased activity tolerance;Decreased safety awareness;Decreased knowledge of use of DME or AE;Decreased knowledge of precautions;Pain      OT Treatment/Interventions:      OT Goals(Current goals can be found in the care plan section) Acute Rehab OT Goals Patient Stated Goal: to feel better OT Goal Formulation: With patient/family Time For Goal Achievement: 10/17/16 Potential to Achieve Goals: Good  OT Frequency:     Barriers to D/C:            Co-evaluation              AM-PAC PT "6 Clicks" Daily Activity     Outcome Measure Help from another person eating meals?: None Help from another person taking care of personal grooming?: A Little Help from another person toileting, which includes using toliet, bedpan, or urinal?: A Little Help from another person bathing (including washing, rinsing, drying)?: A Little Help from another person to put on and taking off regular upper body clothing?: None Help from another person to put on and taking off regular lower body clothing?: A Little 6 Click Score: 20   End of Session Nurse Communication: Mobility status  Activity Tolerance: Patient tolerated treatment well Patient left: in chair;with call bell/phone within reach;with family/visitor present  OT Visit Diagnosis:  Unsteadiness on feet (R26.81);Pain Pain - part of body:  (back)                Time: 7353-2992 OT Time Calculation (min): 25 min Charges:  OT General Charges $OT Visit: 1 Procedure OT Evaluation $OT Eval Moderate Complexity: 1 Procedure OT Treatments $Self Care/Home Management : 8-22 mins G-Codes: OT G-codes **NOT FOR INPATIENT CLASS** Functional Assessment Tool Used: Clinical judgement Functional Limitation: Self care Self Care Current Status (E2683): At least 1 percent but less than 20 percent impaired, limited or restricted Self Care Goal Status (M1962): At least 1 percent but less than 20 percent impaired, limited or restricted Self Care Discharge Status [phone]): At least 1 percent but less than 20 percent impaired, limited or restricted   Norman Herrlich, [place] OTR/L  Pager: [place] 10/03/2016, 10:04 AM

## 2016-10-03 NOTE — Discharge Summary (Signed)
Physician Discharge Summary  Patient ID: Harry Zamora MRN: [id number] DOB/AGE: 06-02-1940 76 y.o.  Admit date: 10/02/2016 Discharge date: 10/03/2016  Admission Diagnoses:Lumbar spinal stenosis with neurogenic claudication  Discharge Diagnoses: Same Active Problems:   Spinal stenosis of lumbar region with neurogenic claudication   Discharged Condition: good  Hospital Course: Patient underwent decompressive lumbar laminectomy for spinal stenosis L 3 - S 1 levels, from which he did well.  He was discharged home on POD 1.  Consults: None  Significant Diagnostic Studies: None  Treatments: surgery: decompressive lumbar laminectomy for spinal stenosis L 3 - S 1 levels  Discharge Exam: Blood pressure 127/64, pulse 88, temperature 99.5 F (37.5 C), temperature source Oral, resp. rate 18, SpO2 95 %. Neurologic: Alert and oriented X 3, normal strength and tone. Normal symmetric reflexes. Normal coordination and gait Wound:CDI  Disposition: Home  Discharge Instructions    Diet - low sodium heart healthy    Complete by:  As directed    Increase activity slowly    Complete by:  As directed      Allergies as of 10/03/2016      Reactions   Levaquin [levofloxacin In D5w] Rash   Niaspan [niacin Er] Rash, Other (See Comments)   Flushing reaction   Tramadol Rash      Medication List    TAKE these medications   acetaminophen 500 MG tablet Commonly known as:  TYLENOL Take 1,000 mg by mouth daily.   buPROPion 150 MG 24 hr tablet Commonly known as:  WELLBUTRIN XL Take 150 mg by mouth daily.   FARXIGA 5 MG Tabs tablet Generic drug:  dapagliflozin propanediol Take 5 mg by mouth daily.   gabapentin 300 MG capsule Commonly known as:  NEURONTIN Take 600 mg by mouth daily.   gemfibrozil 600 MG tablet Commonly known as:  LOPID Take 600 mg by mouth 2 (two) times daily before a meal.   glipiZIDE 5 MG tablet Commonly known as:  GLUCOTROL Take 5 mg by mouth daily before  breakfast.   HYDROcodone-acetaminophen 5-325 MG tablet Commonly known as:  NORCO/VICODIN Take 1-2 tablets by mouth every 4 (four) hours as needed (breakthrough pain).   lidocaine 5 % Commonly known as:  LIDODERM Place 1 patch onto the skin daily as needed (hip pain). Remove & Discard patch within 12 hours or as directed by MD   lovastatin 40 MG tablet Commonly known as:  MEVACOR Take 80 mg by mouth daily.   meloxicam 15 MG tablet Commonly known as:  MOBIC Take 15 mg by mouth daily.   metFORMIN 500 MG tablet Commonly known as:  GLUCOPHAGE Take 1,000 mg by mouth 2 (two) times daily with a meal.   methocarbamol 500 MG tablet Commonly known as:  ROBAXIN Take 1 tablet (500 mg total) by mouth every 6 (six) hours as needed for muscle spasms.   MULTIVITAMIN ADULTS 50+ PO Take 1 tablet by mouth daily.   rOPINIRole 2 MG tablet Commonly known as:  REQUIP Take 2 mg by mouth at bedtime.   vitamin B-12 1000 MCG tablet Commonly known as:  CYANOCOBALAMIN Take 1,000 mcg by mouth daily.   VITAMIN C PO Take 1 tablet by mouth daily.   Vitamin D-3 1000 units Caps Take 1,000 Units by mouth daily.        Signed: Peggyann Shoals, MD 10/03/2016, 6:53 AM

## 2016-10-03 NOTE — Evaluation (Signed)
Physical Therapy Evaluation/Discharge Patient Details Name: Harry Zamora MRN: [id number] DOB: 06-25-1940 Today's Date: 10/03/2016   History of Present Illness  76 yo admitted for L3-S1 laminectomy. PMHx: prostate CA, HTN, DM  Clinical Impression  Pt very pleasant moving well and eager to return home. Pt educated for all back precautions with handout provided and able to verbalize and demonstrate understanding. Pt mod I for all mobility with assist of wife and no further needs at this time, pt aware and agreeable, will sign off.     Follow Up Recommendations No PT follow up    Equipment Recommendations  None recommended by PT    Recommendations for Other Services       Precautions / Restrictions Precautions Precautions: Back Precaution Booklet Issued: Yes (comment) Restrictions Weight Bearing Restrictions: No      Mobility  Bed Mobility               General bed mobility comments: pt n chair on arrival  Transfers Overall transfer level: Modified independent                  Ambulation/Gait Ambulation/Gait assistance: Modified independent (Device/Increase time) Ambulation Distance (Feet): 450 Feet Assistive device: None Gait Pattern/deviations: Step-through pattern   Gait velocity interpretation: at or above normal speed for age/gender General Gait Details: pt with genu varus, controlled gait without LOB  Stairs Stairs: Yes Stairs assistance: Independent Stair Management: Forwards Number of Stairs: 1    Wheelchair Mobility    Modified Rankin (Stroke Patients Only)       Balance Overall balance assessment: No apparent balance deficits (not formally assessed)                                           Pertinent Vitals/Pain Pain Assessment: 0-10 Pain Score: 8  Pain Location: incision Pain Descriptors / Indicators: Aching Pain Intervention(s): Limited activity within patient's tolerance;Repositioned;Monitored during  session;Premedicated before session    Home Living Family/patient expects to be discharged to:: Private residence Living Arrangements: Spouse/significant other Available Help at Discharge: Family Type of Home: House Home Access: Stairs to enter   Technical brewer of Steps: 1 Home Layout: One level Home Equipment: None      Prior Function Level of Independence: Independent         Comments: works part time     Journalist, newspaper        Extremity/Trunk Assessment   Upper Extremity Assessment Upper Extremity Assessment: Overall WFL for tasks assessed    Lower Extremity Assessment Lower Extremity Assessment: Overall WFL for tasks assessed (pt with bil genu varus)    Cervical / Trunk Assessment Cervical / Trunk Assessment: Other exceptions Cervical / Trunk Exceptions: post surgical  Communication   Communication: No difficulties  Cognition Arousal/Alertness: Awake/alert Behavior During Therapy: WFL for tasks assessed/performed Overall Cognitive Status: Within Functional Limits for tasks assessed                                        General Comments      Exercises     Assessment/Plan    PT Assessment Patent does not need any further PT services  PT Problem List         PT Treatment Interventions      PT Goals (Current  goals can be found in the Care Plan section)  Acute Rehab PT Goals PT Goal Formulation: All assessment and education complete, DC therapy    Frequency     Barriers to discharge        Co-evaluation               AM-PAC PT "6 Clicks" Daily Activity  Outcome Measure Difficulty turning over in bed (including adjusting bedclothes, sheets and blankets)?: None Difficulty moving from lying on back to sitting on the side of the bed? : None Difficulty sitting down on and standing up from a chair with arms (e.g., wheelchair, bedside commode, etc,.)?: None Help needed moving to and from a bed to chair (including a  wheelchair)?: None Help needed walking in hospital room?: None Help needed climbing 3-5 steps with a railing? : None 6 Click Score: 24    End of Session   Activity Tolerance: Patient tolerated treatment well Patient left: in chair;with call bell/phone within reach;with family/visitor present Nurse Communication: Mobility status;Precautions PT Visit Diagnosis: Difficulty in walking, not elsewhere classified (R26.2)    Time: 4656-8127 PT Time Calculation (min) (ACUTE ONLY): 13 min   Charges:   PT Evaluation $PT Eval Low Complexity: 1 Procedure     PT G Codes:        Elwyn Reach, PT [phone]   [place] 10/03/2016, 9:15 AM

## 2016-10-03 NOTE — Discharge Instructions (Signed)

## 2016-10-03 NOTE — Progress Notes (Signed)
Subjective: Patient reports doing well.  Objective: Vital signs in last 24 hours: Temp:  [97.3 F (36.3 C)-99.5 F (37.5 C)] 99.5 F (37.5 C) (07/21 0325) Pulse Rate:  [59-88] 88 (07/21 0325) Resp:  [8-20] 18 (07/21 0325) BP: (123-162)/(63-84) 127/64 (07/21 0325) SpO2:  [95 %-100 %] 95 % (07/21 0325)  Intake/Output from previous day: 07/20 0730 - 07/21 0729 In: 7371 [P.O.:220; I.V.:1600] Out: 650 [Urine:500; Blood:150] Intake/Output this shift: Total I/O In: 0626 [P.O.:220; I.V.:1600] Out: 650 [Urine:500; Blood:150]  Physical Exam: Strength full.  Dressing CDI.  Legs better, per patient.  Lab Results:  Recent Labs  10/02/16 1209  HGB 14.6  HCT 43.0   BMET  Recent Labs  10/02/16 1209  NA 139  K 4.6  GLUCOSE 113*    Studies/Results: Dg Lumbar Spine 1 View  Result Date: 10/02/2016 CLINICAL DATA:  Lumbar laminectomy EXAM: LUMBAR SPINE - 1 VIEW COMPARISON:  09/01/2016 FINDINGS: Surgical retractors in instruments are noted at the L4-5 and L5-S1 disc space. Numbering nomenclature similar to that utilized on prior MRI examination. IMPRESSION: Lumbar localization as described. Electronically Signed   By: Inez Catalina M.D.   On: 10/02/2016 15:26    Assessment/Plan: Doing well.  Discharge home.    LOS: 1 day    Peggyann Shoals, MD 10/03/2016, 6:52 AM

## 2016-10-05 NOTE — Progress Notes (Signed)
Addendum to Physical Therapy note 10/21/2022   Oct 20, 2016 0915  PT G-Codes **NOT FOR INPATIENT CLASS**  Functional Assessment Tool Used Clinical judgement  Functional Limitation Mobility: Walking and moving around  Mobility: Walking and Moving Around Current Status [phone]) CI  Mobility: Walking and Moving Around Goal Status [phone]) CI  Mobility: Walking and Moving Around Discharge Status [phone]) CI  Elwyn Reach, [place]

## 2018-02-03 ENCOUNTER — Encounter (INDEPENDENT_AMBULATORY_CARE_PROVIDER_SITE_OTHER): Payer: BC Managed Care – PPO | Admitting: Ophthalmology

## 2018-02-03 DIAGNOSIS — H43813 Vitreous degeneration, bilateral: Secondary | ICD-10-CM | POA: Diagnosis not present

## 2018-02-03 DIAGNOSIS — H33022 Retinal detachment with multiple breaks, left eye: Secondary | ICD-10-CM

## 2018-02-21 ENCOUNTER — Encounter (INDEPENDENT_AMBULATORY_CARE_PROVIDER_SITE_OTHER): Payer: Medicare Other | Admitting: Ophthalmology

## 2018-02-21 DIAGNOSIS — H33302 Unspecified retinal break, left eye: Secondary | ICD-10-CM

## 2018-06-27 ENCOUNTER — Encounter (INDEPENDENT_AMBULATORY_CARE_PROVIDER_SITE_OTHER): Payer: Medicare Other | Admitting: Ophthalmology

## 2018-11-10 ENCOUNTER — Other Ambulatory Visit: Payer: Self-pay | Admitting: Neurosurgery

## 2018-11-10 DIAGNOSIS — M48062 Spinal stenosis, lumbar region with neurogenic claudication: Secondary | ICD-10-CM

## 2018-12-14 ENCOUNTER — Ambulatory Visit
Admission: RE | Admit: 2018-12-14 | Discharge: 2018-12-14 | Disposition: A | Discharge status: Home / Self Care | Payer: Medicare Other | Source: Ambulatory Visit | Attending: Neurosurgery | Admitting: Neurosurgery

## 2018-12-14 ENCOUNTER — Other Ambulatory Visit: Payer: Self-pay

## 2018-12-14 DIAGNOSIS — M48062 Spinal stenosis, lumbar region with neurogenic claudication: Secondary | ICD-10-CM

## 2018-12-14 IMAGING — MR MR LUMBAR SPINE WO/W CM
4 of 7 series · 22 of 48 positions shown · IV contrast (Multihance 15ml)
Comparison: MRI lumbar spine [DATE].

CLINICAL DATA: Chronic right worse than left low back pain. Patient
status post L3-S1 laminectomy [DATE].

EXAM:
MRI LUMBAR SPINE WITHOUT AND WITH CONTRAST
TECHNIQUE: Multiplanar and multiecho pulse sequences of the lumbar spine were
obtained without and with intravenous contrast.
CONTRAST:  15 mL MULTIHANCE GADOBENATE DIMEGLUMINE 529 MG/ML IV SOLN

[Series 5: T1 · sagittal · 4.0mm · 0.88mm/px · 3 of 18 slices shown (1 of 2)]
[im 1/18]
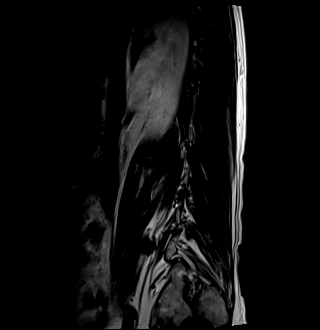
[im 9/18]
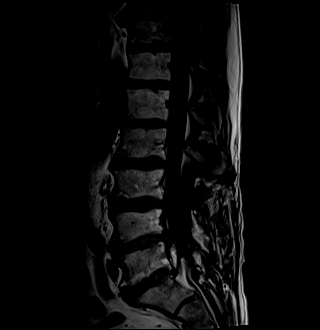
[im 18/18]
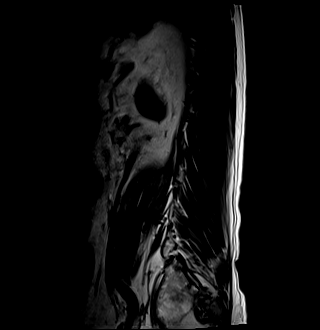

[Series 9: T1 · axial · 4.0mm · 0.35mm/px · z∈[-125,+104]mm · 4 of 48 slices shown (2 of 2)]
[im 1/48]
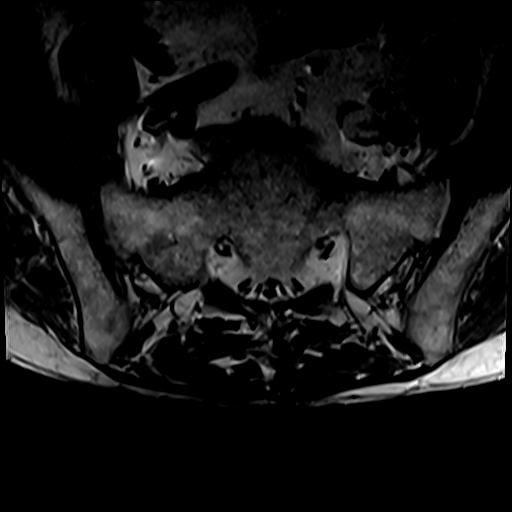
[im 5/48]
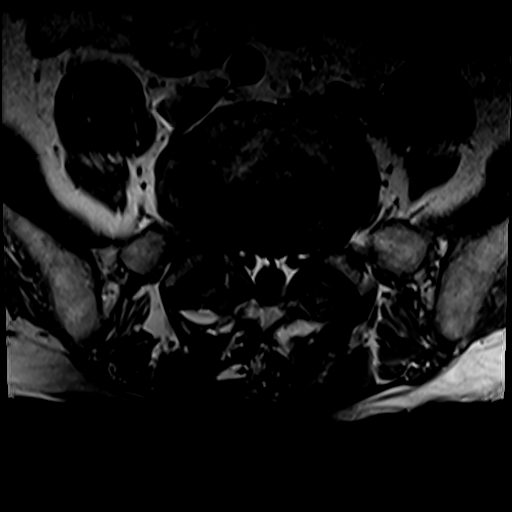
[im 24/48]
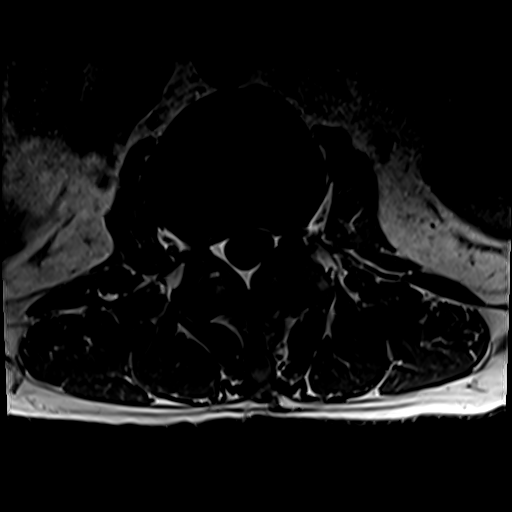
[im 43/48]
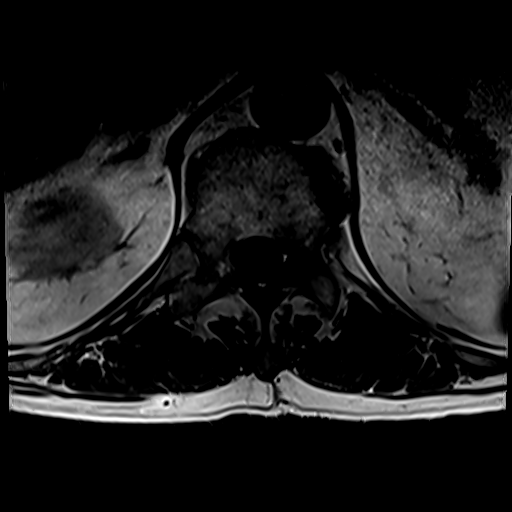

[Series 12: T2 · axial · 4.0mm · 0.35mm/px · z∈[-125,+129]mm · 11 of 48 slices shown (1 of 2)]
[im 1/48]
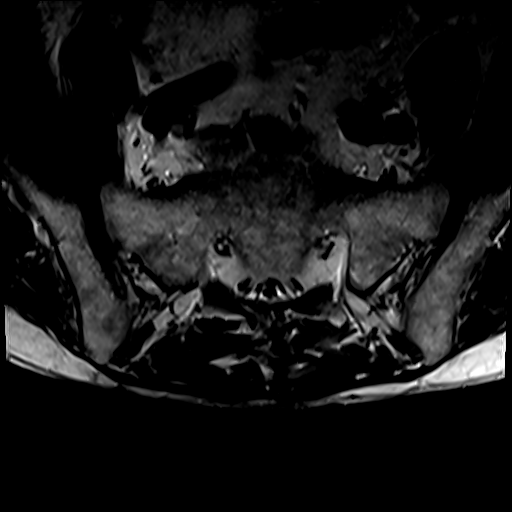
[im 5/48]
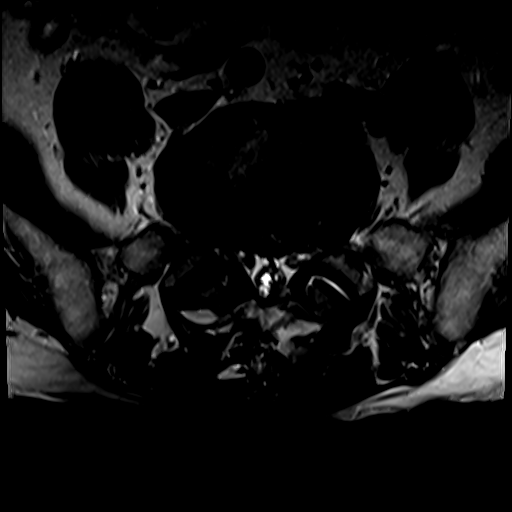
[im 10/48]
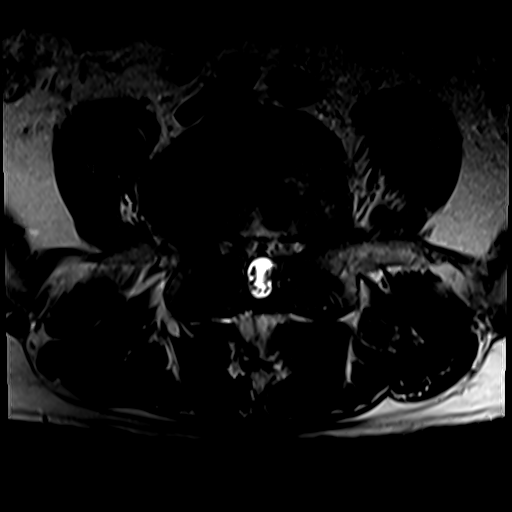
[im 15/48]
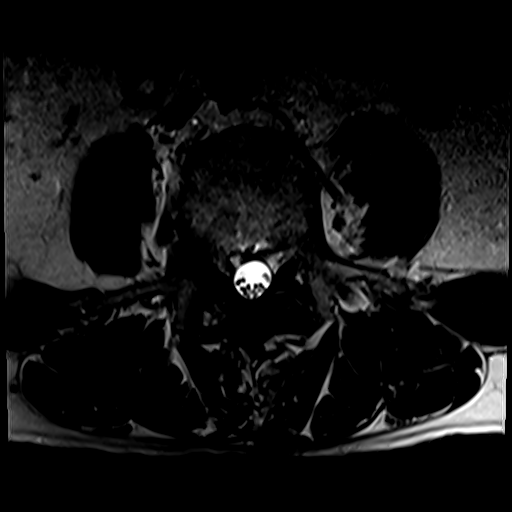
[im 19/48]
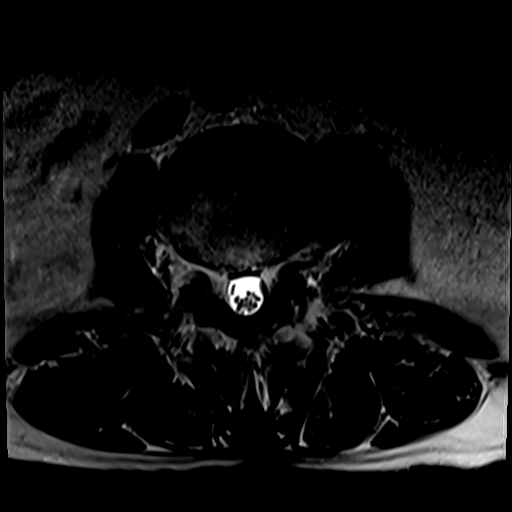
[im 24/48]
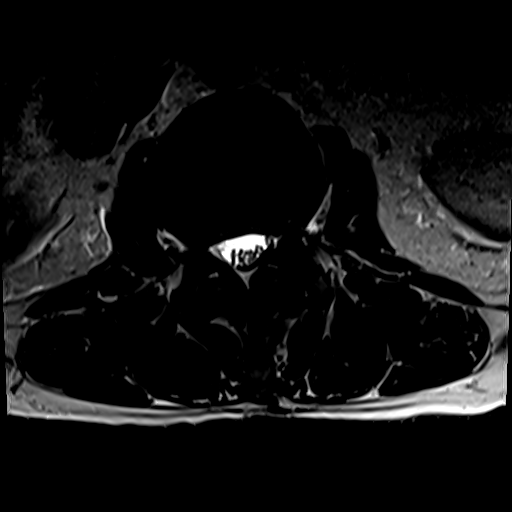
[im 29/48]
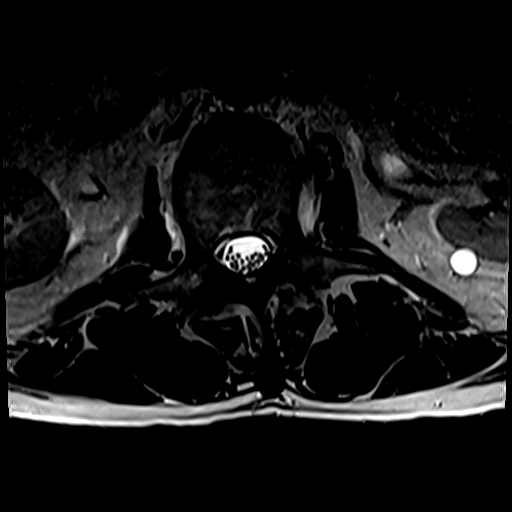
[im 33/48]
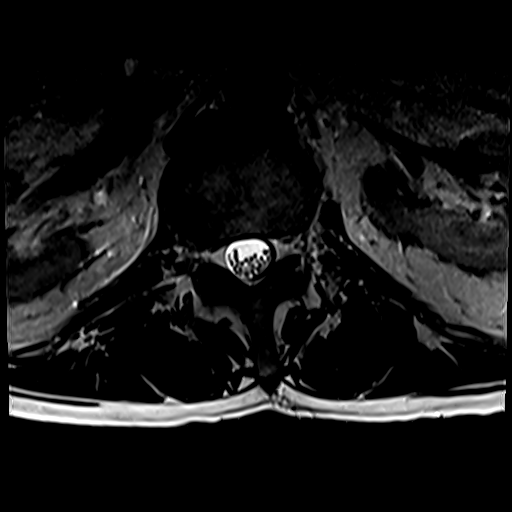
[im 38/48]
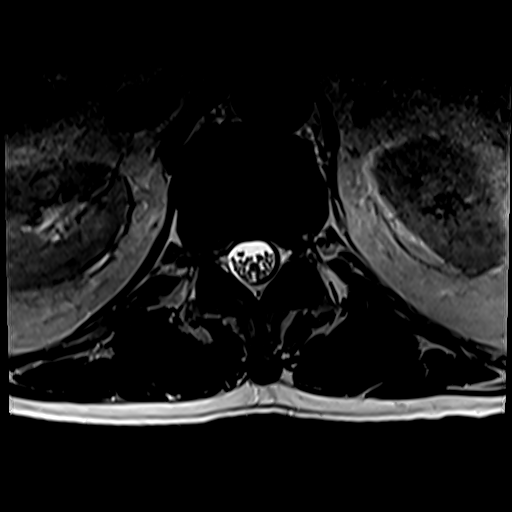
[im 43/48]
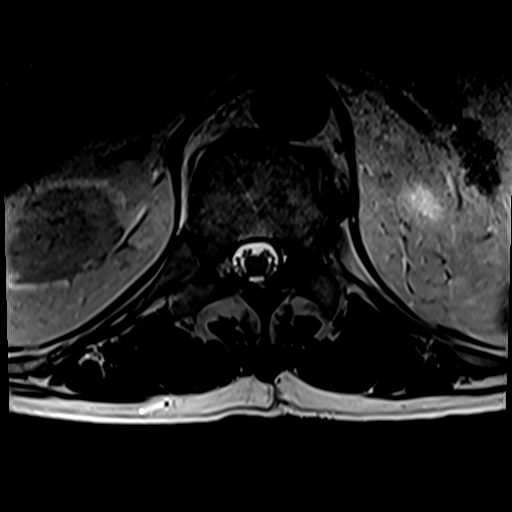
[im 48/48]
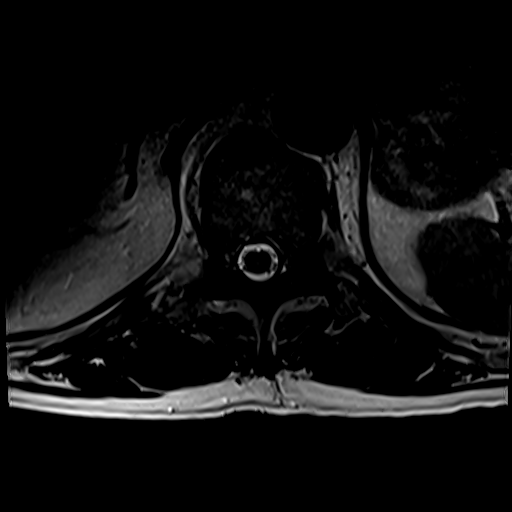

[Series 13: T2 · sagittal · 4.0mm · 0.88mm/px · 4 of 18 slices shown (2 of 2)]
[im 1/18]
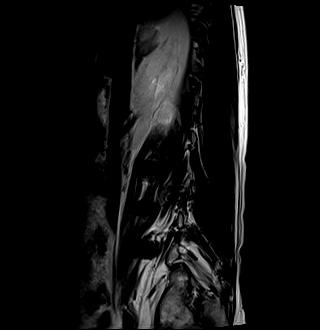
[im 6/18]
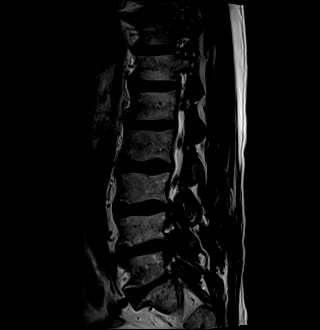
[im 12/18]
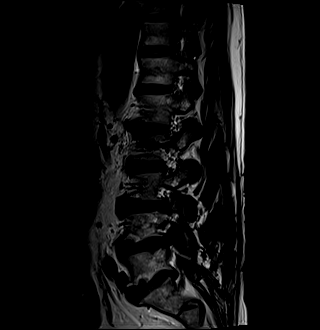
[im 18/18]
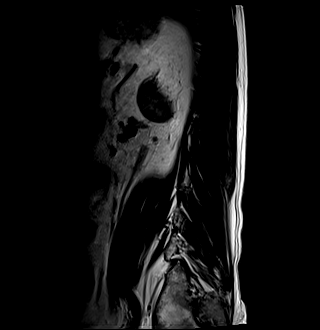

[22 of 48 positions shown; findings below may reference images not displayed]

FINDINGS: Segmentation:  Standard.

Alignment:  Maintained with straightening of lordosis noted.

Vertebrae: No fracture, evidence of discitis, or bone lesion.
Degenerative endplate signal change is seen at L3-4, L4-5 and L5-S1.
A few small Schmorl's nodes are noted.

Conus medullaris and cauda equina: Conus extends to the T12-L1
level. Conus and cauda equina appear normal.

Paraspinal and other soft tissues: Negative.

Disc levels:

T10-11 is imaged in the sagittal plane only. There is a shallow disc
bulge and anterior endplate spurring but the central spinal canal
and neural foramina appear open.

T11-12: Negative.

T12-L1: Negative.

L1-2: Mild facet degenerative disease.  Otherwise negative.

L2-3: Mild-to-moderate facet degenerative change and a very shallow
disc bulge. The central canal and foramina open.

L3-4: Status post posterior decompression since the prior
examination. Broad-based central protrusion has slightly increased
in size since the prior MRI but the central canal is open. Mild to
moderate foraminal narrowing is more notable on the left and has
progressed since the prior exam.

L4-5: Status post posterior decompression since the prior MRI. There
is facet arthropathy and a broad-based central protrusion. Narrowing
of the thecal sac is improved. Narrowing seen in the subarticular
recesses which could impact the descending L5 roots. Moderately
severe to severe foraminal narrowing is worse on the right and has
progressed since the prior exam.

L5-S1: Status post posterior decompression since the prior study.
There is a broad-based left paracentral protrusion but the central
canal and lateral recesses are open. Bilateral foraminal narrowing
appears unchanged.
IMPRESSION: Mild increase in the size of the broad-based central protrusion at
L4-5 causes moderate to moderately severe bilateral foraminal
narrowing which is worse on the right. There is also some narrowing
in the subarticular recesses at L4-5. Central canal stenosis on the
prior exam is relieved after posterior decompression.

Some increase in left worse than right moderate to moderately severe
foraminal narrowing at L3-4 due to increase in the size a
broad-based central protrusion. The central canal is patent after
posterior decompression.

Patent central canal at L5-S1 after posterior decompression. Right
worse than left foraminal narrowing at this level appears unchanged.

## 2018-12-14 MED ORDER — GADOBENATE DIMEGLUMINE 529 MG/ML IV SOLN
15.0000 mL | Freq: Once | INTRAVENOUS | Status: AC | PRN
  Administered 2018-12-14: 15 mL via INTRAVENOUS

## 2019-03-07 ENCOUNTER — Ambulatory Visit: Payer: Medicare Other | Attending: Internal Medicine

## 2019-03-07 DIAGNOSIS — R238 Other skin changes: Secondary | ICD-10-CM

## 2019-03-07 DIAGNOSIS — U071 COVID-19: Secondary | ICD-10-CM

## 2019-03-09 LAB — NOVEL CORONAVIRUS, NAA: SARS-CoV-2, NAA: NOT DETECTED

## 2019-04-11 ENCOUNTER — Ambulatory Visit: Payer: Medicare Other

## 2019-04-18 ENCOUNTER — Other Ambulatory Visit: Payer: Self-pay | Admitting: Neurosurgery

## 2019-04-18 DIAGNOSIS — M48062 Spinal stenosis, lumbar region with neurogenic claudication: Secondary | ICD-10-CM

## 2019-04-18 DIAGNOSIS — M5126 Other intervertebral disc displacement, lumbar region: Secondary | ICD-10-CM

## 2019-04-20 ENCOUNTER — Ambulatory Visit: Payer: Medicare Other | Attending: Internal Medicine

## 2019-04-20 DIAGNOSIS — Z23 Encounter for immunization: Secondary | ICD-10-CM

## 2019-04-20 NOTE — Progress Notes (Signed)
   Covid-19 Vaccination Clinic  Name:  Harry Zamora    MRN: [id number] DOB: 13-Feb-1941  04/20/2019  Harry Zamora was observed post Covid-19 immunization for 15 minutes without incidence. He was provided with Vaccine Information Sheet and instruction to access the V-Safe system.   Harry Zamora was instructed to call 911 with any severe reactions post vaccine: Marland Kitchen Difficulty breathing  . Swelling of your face and throat  . A fast heartbeat  . A bad rash all over your body  . Dizziness and weakness    Immunizations Administered    Name Date Dose VIS Date Route   Pfizer COVID-19 Vaccine 04/20/2019  3:18 PM 0.3 mL 02/24/2019 Intramuscular   Manufacturer: [place]   Lot: [id number]   [place]: [id number]

## 2019-05-01 IMAGING — CR DG OR LOCAL ABDOMEN
1 series · 1 of 1 positions shown · non-contrast
Comparison: [DATE]

CLINICAL DATA: Evaluate for retained instruments and sponges.

EXAM:
OR LOCAL ABDOMEN

[AP]
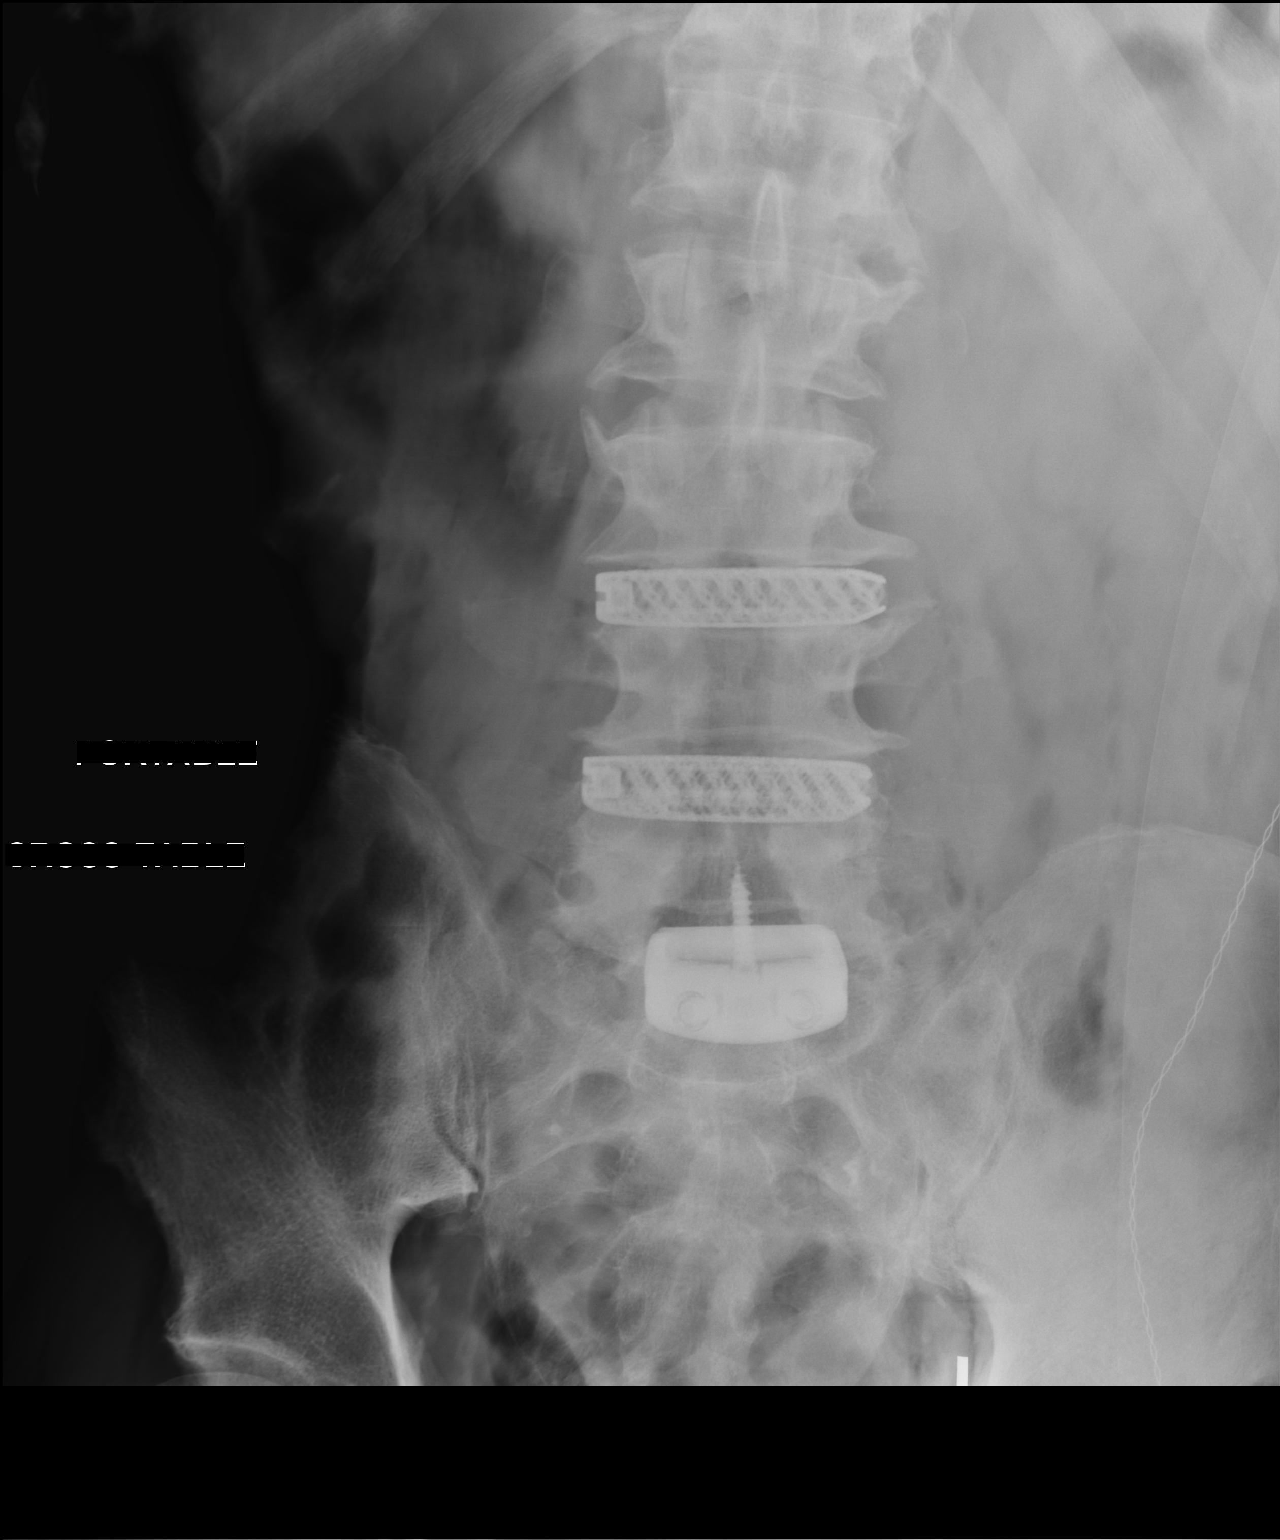

[1 of 1 positions shown; findings below may reference images not displayed]

FINDINGS: Interval placement of interbody fusion devices at L3-4 and L4-5.
Stable appearance of interbody fusion device at L5-S1. no retained
foreign body identified. Visualized bowel gas pattern is
nonobstructive.
IMPRESSION: 1. Postoperative changes.
2. No retained foreign body.
3. Findings are called to the operating room at the time of
interpretation.

## 2019-05-09 ENCOUNTER — Ambulatory Visit
Admission: RE | Admit: 2019-05-09 | Discharge: 2019-05-09 | Disposition: A | Discharge status: Home / Self Care | Payer: Medicare PPO | Source: Ambulatory Visit | Attending: Neurosurgery | Admitting: Neurosurgery

## 2019-05-09 ENCOUNTER — Other Ambulatory Visit: Payer: Self-pay

## 2019-05-09 DIAGNOSIS — M48062 Spinal stenosis, lumbar region with neurogenic claudication: Secondary | ICD-10-CM

## 2019-05-09 DIAGNOSIS — M5126 Other intervertebral disc displacement, lumbar region: Secondary | ICD-10-CM

## 2019-05-09 IMAGING — MR MR LUMBAR SPINE WO/W CM
5 of 8 series · 27 of 48 positions shown · IV contrast (Multihance)
Comparison: [DATE]

CLINICAL DATA: Low back pain

EXAM:
MRI LUMBAR SPINE WITHOUT AND WITH CONTRAST
TECHNIQUE: Multiplanar and multiecho pulse sequences of the lumbar spine were
obtained without and with intravenous contrast.
CONTRAST:  16mL MULTIHANCE GADOBENATE DIMEGLUMINE 529 MG/ML IV SOLN

[Series 4: T2 post-contrast · sagittal · 4.0mm · 0.55mm/px · 3 of 13 slices shown (1 of 2)]
[im 1/13]
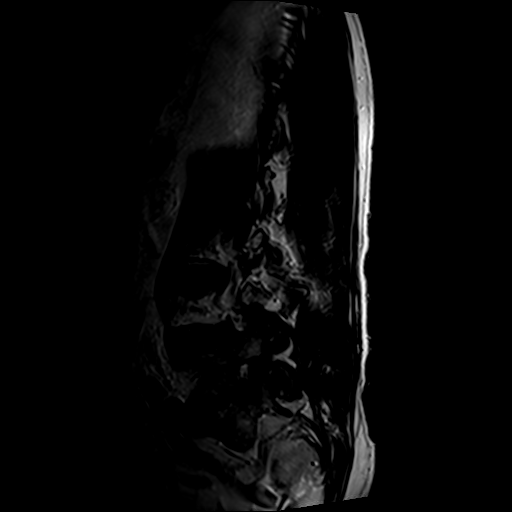
[im 7/13]
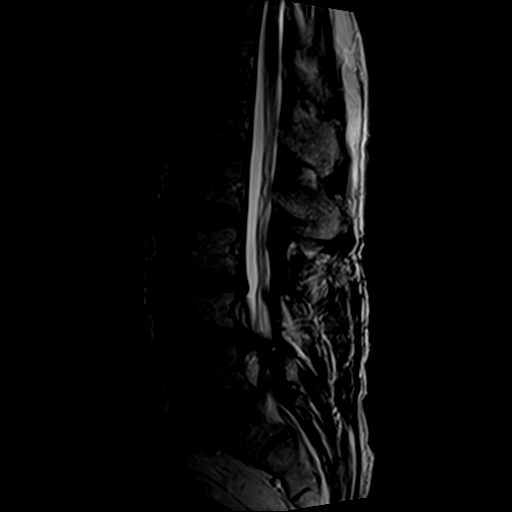
[im 13/13]
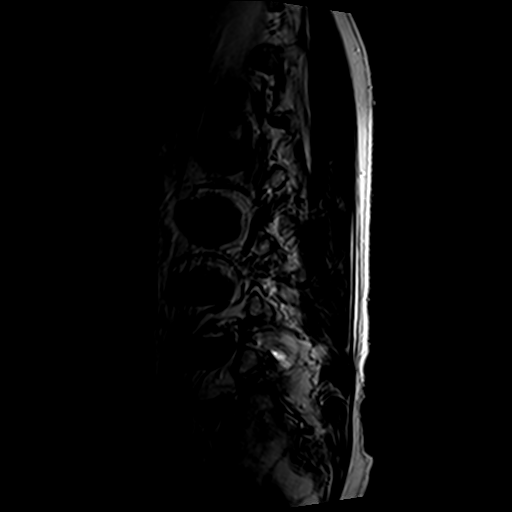

[Series 6: T1 · sagittal · 4.0mm · 0.55mm/px · 3 of 13 slices shown (1 of 2)]
[im 1/13]
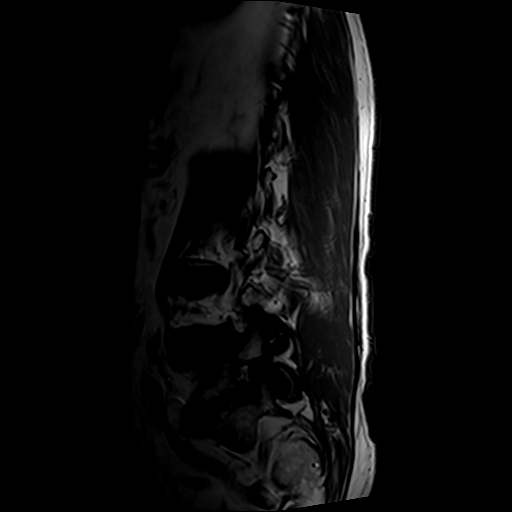
[im 7/13]
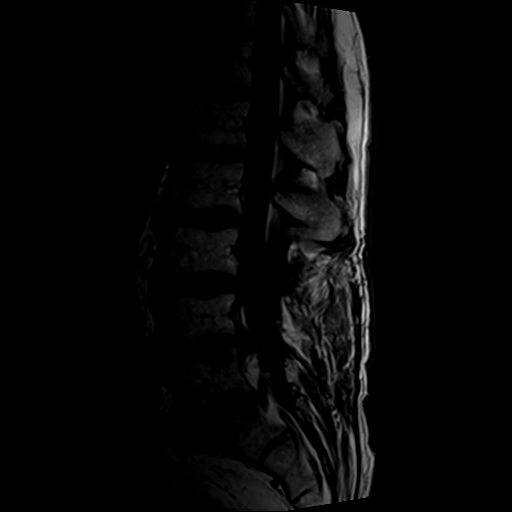
[im 13/13]
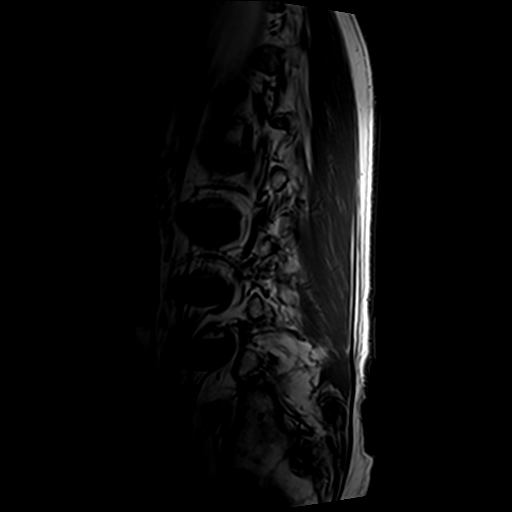

[Series 7: T1 · axial · 4.0mm · 0.35mm/px · z∈[-87,+96]mm · 7 of 43 slices shown (2 of 2)]
[im 1/43]
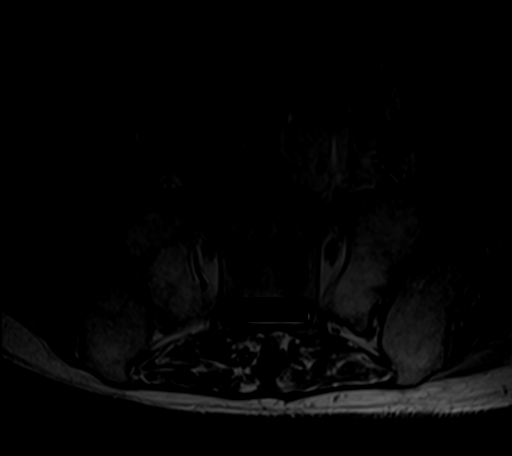
[im 9/43]
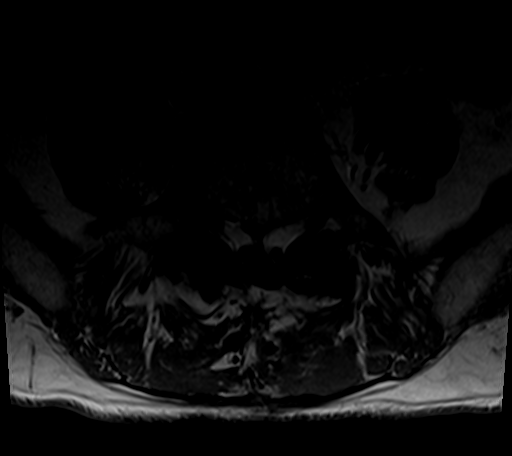
[im 13/43]
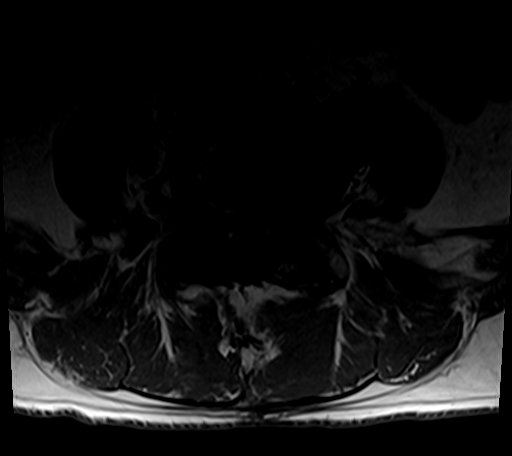
[im 17/43]
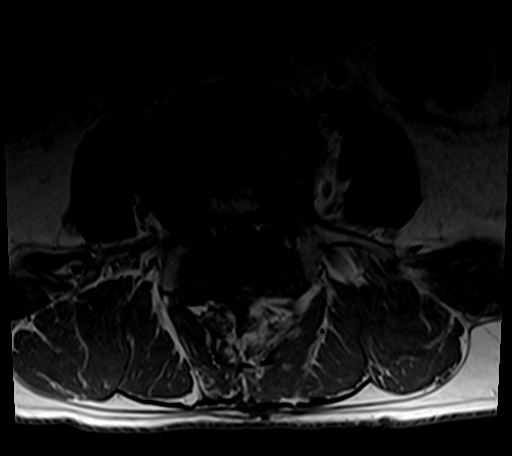
[im 26/43]
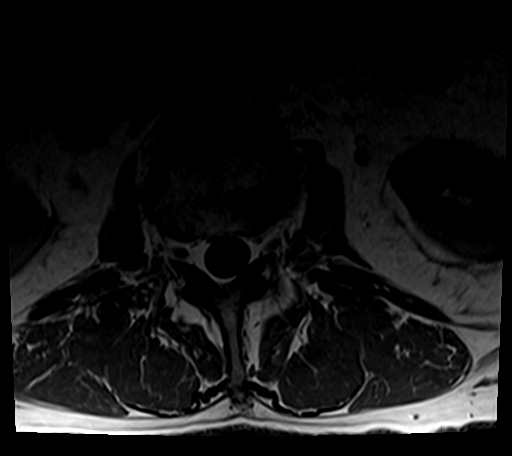
[im 30/43]
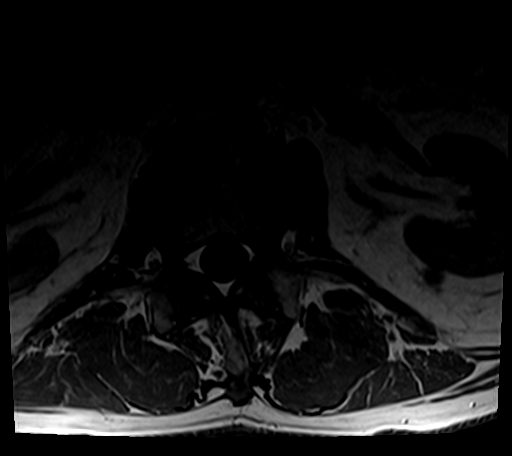
[im 34/43]
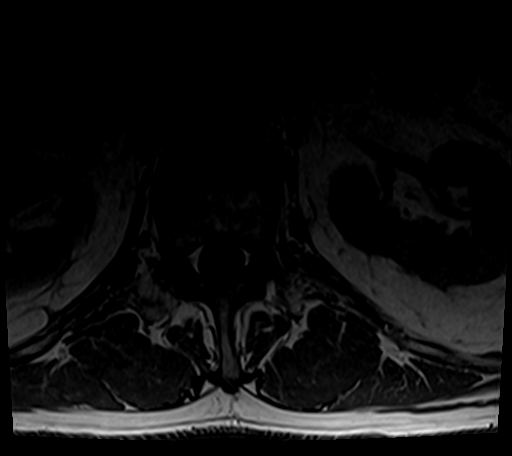

[Series 8: T2 · axial · 4.0mm · 0.70mm/px · z∈[-87,+155]mm · 11 of 43 slices shown]
[im 1/43]
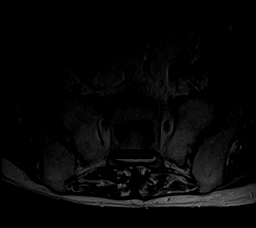
[im 5/43]
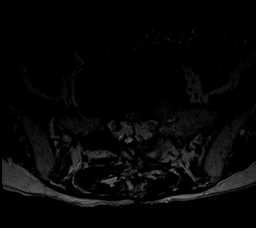
[im 9/43]
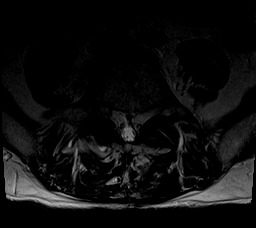
[im 13/43]
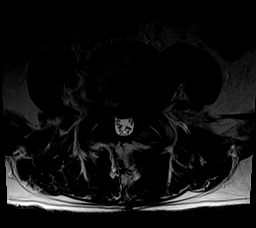
[im 17/43]
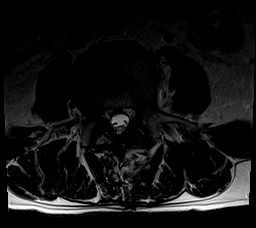
[im 22/43]
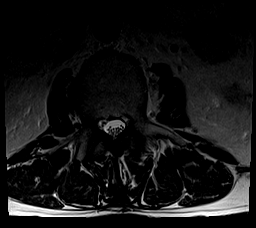
[im 26/43]
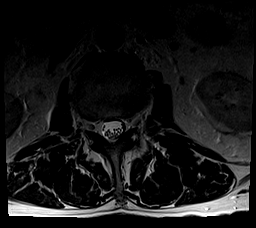
[im 30/43]
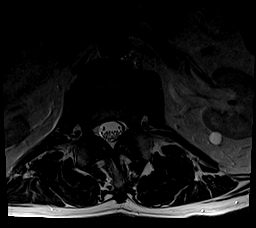
[im 34/43]
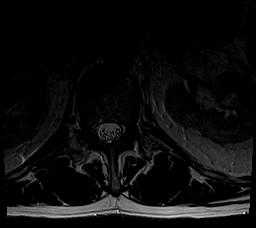
[im 38/43]
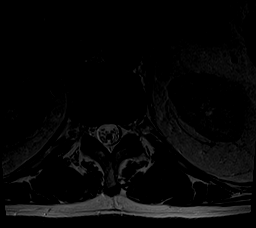
[im 43/43]
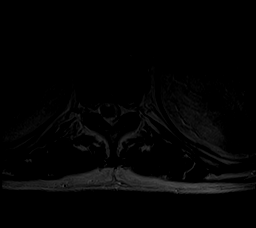

[Series 9: T2 post-contrast · sagittal · 4.0mm · 0.55mm/px · 3 of 13 slices shown (2 of 2)]
[im 1/13]
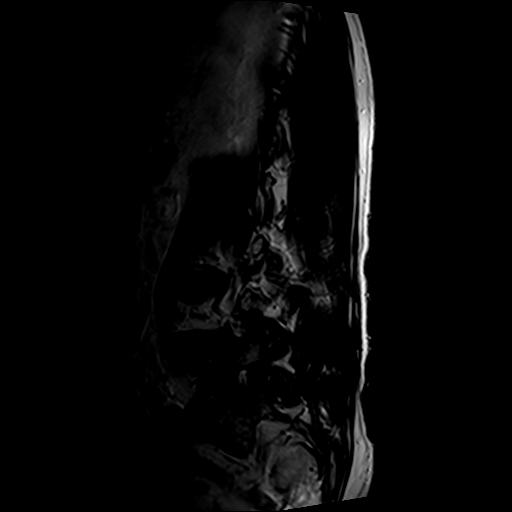
[im 7/13]
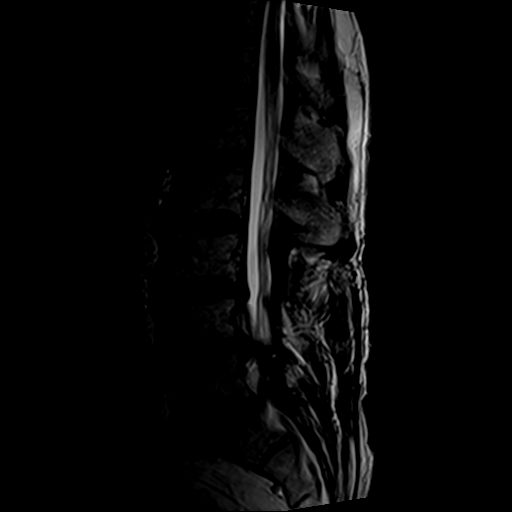
[im 13/13]
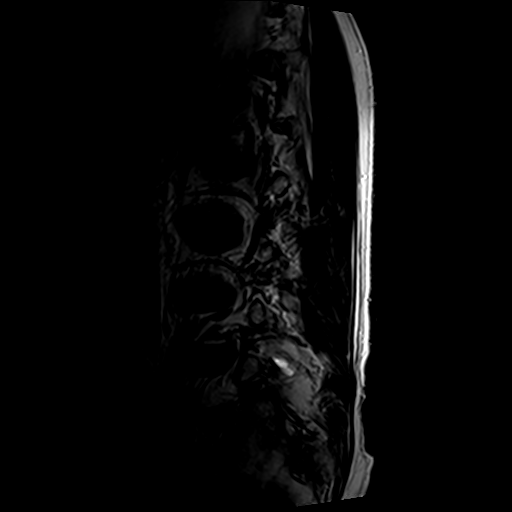

[27 of 48 positions shown; findings below may reference images not displayed]

FINDINGS: Segmentation:  Standard.

Alignment:  Stable.

Vertebrae: Stable vertebral body heights with multilevel
degenerative endplate irregularity. There is trace degenerative
endplate marrow edema. No suspicious osseous lesion.

Conus medullaris and cauda equina: Conus extends to the L1 level.
Conus and cauda equina appear normal.

Paraspinal and other soft tissues: Chronic postoperative changes.
Otherwise unremarkable.

Disc levels:

L1-L2: Small right foraminal protrusion and mild facet arthropathy.
No canal or left foraminal stenosis. Minor right foraminal stenosis.
Appearance is similar.

L2-L3: Disc bulge and facet arthropathy. No significant canal or
foraminal stenosis.

L3-L4: Posterior decompression. Disc bulge with endplate osteophytic
ridging and facet arthropathy. No canal stenosis. Slight effacement
of the lateral recesses. Moderate foraminal stenosis. Appearance is
similar.

L4-L5: Posterior decompression. Disc bulge with endplate osteophytic
ridging and facet arthropathy. No canal stenosis. Narrowing of the
lateral recesses. Marked right and moderate to marked left foraminal
stenosis. Appearance is similar.

L5-S1: Posterior decompression. Disc bulge with endplate osteophytic
ridging and facet arthropathy. No canal stenosis. Marked right and
moderate left foraminal stenosis. Appearance is similar.
IMPRESSION: Postoperative and degenerative changes as detailed above. There is
no high-grade canal stenosis. Lateral recess stenosis is greatest at
L4-L5. Foraminal stenosis is present from L3-L4 through L5-S1.
Overall appearance is similar to [7F] examination.

## 2019-05-09 MED ORDER — GADOBENATE DIMEGLUMINE 529 MG/ML IV SOLN
16.0000 mL | Freq: Once | INTRAVENOUS | Status: AC | PRN
  Administered 2019-05-09: 16 mL via INTRAVENOUS

## 2019-05-15 ENCOUNTER — Ambulatory Visit: Payer: Medicare PPO | Attending: Internal Medicine

## 2019-05-15 DIAGNOSIS — Z23 Encounter for immunization: Secondary | ICD-10-CM

## 2019-05-15 NOTE — Progress Notes (Signed)
   Covid-19 Vaccination Clinic  Name:  Harry Zamora    MRN: [id number] DOB: 1940-03-30  05/15/2019  Harry Zamora was observed post Covid-19 immunization for 15 minutes without incidence. He was provided with Vaccine Information Sheet and instruction to access the V-Safe system.   Harry Zamora was instructed to call 911 with any severe reactions post vaccine: Marland Kitchen Difficulty breathing  . Swelling of your face and throat  . A fast heartbeat  . A bad rash all over your body  . Dizziness and weakness    Immunizations Administered    Name Date Dose VIS Date Route   Pfizer COVID-19 Vaccine 05/15/2019  5:26 PM 0.3 mL 02/24/2019 Intramuscular   Manufacturer: [place]   Lot: [id number]   [place]: [id number]

## 2019-06-06 ENCOUNTER — Other Ambulatory Visit: Payer: Self-pay

## 2019-06-06 ENCOUNTER — Other Ambulatory Visit: Payer: Self-pay | Admitting: Neurosurgery

## 2019-06-09 ENCOUNTER — Other Ambulatory Visit: Payer: Self-pay

## 2019-06-19 NOTE — H&P (Signed)
Patient ID:   [id number] Patient: Harry Zamora  Date of Birth: 07-03-1940 Visit Type: Office Visit   Date: 05/15/2019 09:45 AM Provider: Marchia Meiers. Vertell Limber MD   This 79 year old male presents for MRI Review/back pain.  HISTORY OF PRESENT ILLNESS: 1.  MRI Review/back pain  Patient returns to review his MRI   the patient describes current pain as 7/10 in severity.  He says he is better with sitting and his pain goes down to a 3/10.  He is complaining of pain into his left leg to his knee.    His lumbar MRI shows extensive degenerative changes with disc herniations at L3-4, L4-5 and L5-S1 levels.    Based on my review of the patient's imaging I have recommended proceeding with redo surgery.  This will consist of a LIF at L5-S1 with right-sided XL IF at L3-4 and L4-5 levels with percutaneous pedicle screw fixation L3 through S1 levels.      Medical/Surgical/Interim History Reviewed, no change.  Last detailed document date:09/14/2016.     PAST MEDICAL HISTORY, SURGICAL HISTORY, FAMILY HISTORY, SOCIAL HISTORY AND REVIEW OF SYSTEMS I have reviewed the patient's past medical, surgical, family and social history as well as the comprehensive review of systems as included on the [facility] history form dated 01/09/2019, which I have signed.  Family History: Reviewed, no changes.  Last detailed document date:09/14/2016.   Social History: Reviewed, no changes. Last detailed document date: 09/14/2016.    MEDICATIONS: (added, continued or stopped this visit) Started Medication Directions Instruction Stopped  Farxiga 5 mg tablet take 1 tablet by oral route  every day in the morning    gabapentin 300 mg capsule take 1 capsule by oral route 3 times every day    glipizide 5 mg tablet take 1 tablet by oral route 2 times every day before meals   03/31/2019 hydrocodone 5 mg-acetaminophen 325 mg tablet take 1 tablet by oral route  every 8 hours as  needed for pain    lisinopril 20 mg tablet take 1 tablet by oral route  every day    lovastatin 40 mg tablet take 1 tablet by oral route  every day with the evening meal    meloxicam 15 mg tablet take 1 tablet by oral route  every day    metformin 500 mg tablet take 1 tablet by oral route 2 times every day with morning and evening meals    ropinirole 4 mg tablet take 1 tablet by oral route  every day      ALLERGIES: Ingredient Reaction Medication Name Comment NIACIN Rash    Reviewed, no changes.    PHYSICAL EXAM:  Vitals Date Temp F BP Pulse Ht In Wt Lb BMI BSA Pain Score 05/15/2019 97.3 127/67 81 74 179.2 23.01  7/10     IMPRESSION:    The patient is continuing to have persistent left leg pain and weakness and is not improving with conservative management his imaging demonstrates disc herniations and recurrent stenosis with lumbar sagittal imbalance L3 through S1 levels  PLAN:   Based on my review of the patient's imaging I have recommended proceeding with redo surgery.  This will consist of a LIF at L5-S1 with right-sided XL IF at L3-4 and L4-5 levels with percutaneous pedicle screw fixation L3 through S1 levels.  Orders: Diagnostic Procedures: Assessment Procedure M54.16 Lumbar Spine- AP/Lat Instruction(s)/Education: Assessment Instruction I10 Lifestyle education Miscellaneous: Assessment  M48.062 LSO Brace  Completed Orders (this encounter) Order  Details Reason Side Interpretation Result Initial Treatment Date Region Lifestyle education Patient will follow up with Primary Care Physician.        Assessment/Plan  # Detail Type Description  1. Assessment Lumbar radiculopathy (M54.16).     2. Assessment Disc displacement, lumbar (M51.26).     3. Assessment Low back pain, unspecified back pain laterality, with sciatica presence unspecified (M54.5).     4. 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  Plan Orders LSO Brace.     5. Assessment Essential (primary) hypertension (I10).       Pain Management Plan Pain Scale: 7/10. Method: Numeric Pain Intensity Scale. Location: back. Onset: 09/02/2018. Duration: varies. Quality: discomforting. Pain management follow-up plan of care: Patient taking medications as prescribed.              Provider:  Marchia Meiers. Vertell Limber MD  05/21/2019 04:56 PM    Dictation edited by: Marchia Meiers. Vertell Limber    CC Providers: [name] [facility] [address] [place],  [place]  [id number]-   Kelcie Currie MD  [address] [place], [place] [address]               Electronically signed by Marchia Meiers. Vertell Limber MD on 05/21/2019 04:56 PM

## 2019-06-30 ENCOUNTER — Telehealth (HOSPITAL_COMMUNITY): Payer: Self-pay

## 2019-06-30 NOTE — Telephone Encounter (Signed)

## 2019-07-03 ENCOUNTER — Other Ambulatory Visit: Payer: Self-pay

## 2019-07-03 ENCOUNTER — Encounter: Payer: Self-pay | Admitting: Surgery

## 2019-07-03 ENCOUNTER — Ambulatory Visit (INDEPENDENT_AMBULATORY_CARE_PROVIDER_SITE_OTHER): Payer: Medicare PPO | Admitting: Surgery

## 2019-07-03 VITALS — BP 146/67 | HR 77 | Temp 97.2°F | Resp 20 | Ht 74.0 in | Wt 179.0 lb

## 2019-07-03 DIAGNOSIS — M479 Spondylosis, unspecified: Secondary | ICD-10-CM

## 2019-07-03 NOTE — Progress Notes (Signed)
A diabetic                                   [facility] [place]  Patient name: Harry Zamora MRN: [id number] DOB: 1940-07-16 Sex: male   REQUESTING PROVIDER:    Dr. Vertell Limber   REASON FOR CONSULT:    Anterior exposure  HISTORY OF PRESENT ILLNESS:   Harry Zamora is a 79 y.o. male, who is referred for evaluation of L5-S1 anterior exposure.  The patient's only prior abdominal surgery is a laparoscopic cholecystectomy.Marland Kitchen  He takes a statin for hypercholesterolemia.  PAST MEDICAL HISTORY    Past Medical History:  Diagnosis Date  . Anemia    as an infant  . Arthritis   . Back pain   . Depression   . Disc displacement, lumbar   . Gynecomastia   . Hyperlipidemia   . Hypertension    no longer on medications  . Hypertriglyceridemia   . Insomnia   . Low back pain   . Lumbar radiculopathy   . Lumbar stenosis   . OA (osteoarthritis)   . Polyneuropathy in diabetes(357.2)   . Prostate cancer ([place])   . Restless legs   . Ringing in ears   . RLS (restless legs syndrome)   . Type 2 diabetes mellitus ([place])      FAMILY HISTORY   Family History  Problem Relation Age of Onset  . Arthritis Mother   . Hypertension Mother   . Diabetes Mother   . Heart attack Mother   . Cancer Father     SOCIAL HISTORY:   Social History   Socioeconomic History  . Marital status: Married    Spouse name: Leda Gauze  . Number of children: 3  . Years of education: 33  . Highest education level: Not on file  Occupational History  . Occupation: Banker: [employer]  Tobacco Use  . Smoking status: Light Tobacco Smoker    Types: Cigars  . Smokeless tobacco: Never Used  Substance and Sexual Activity  . Alcohol use: Yes    Comment: social-beer  . Drug use: No  . Sexual activity: Not on file  Other Topics Concern  . Not on file  Social History Narrative   Patient is married Leda Gauze) and lives at home with his wife.   Patient has three children and two-  step children.   Patient is working full-time.   Patient has a high school education.   Patient is right-handed.   Patient drinks two cups of coffee, 2-3 sodas and one cup of tea daily.   Social Determinants of Health   Financial Resource Strain:   . Difficulty of Paying Living Expenses:   Food Insecurity:   . Worried About Charity fundraiser in the Last Year:   . Arboriculturist in the Last Year:   Transportation Needs:   . Film/video editor (Medical):   Marland Kitchen Lack of Transportation (Non-Medical):   Physical Activity:   . Days of Exercise per Week:   . Minutes of Exercise per Session:   Stress:   . Feeling of Stress :   Social Connections:   . Frequency of Communication with Friends and Family:   . Frequency of Social Gatherings with Friends and Family:   . Attends Religious Services:   . Active Member of Clubs or Organizations:   . Attends Club  or Organization Meetings:   Marland Kitchen Marital Status:   Intimate Partner Violence:   . Fear of Current or Ex-Partner:   . Emotionally Abused:   Marland Kitchen Physically Abused:   . Sexually Abused:     ALLERGIES:    Allergies  Allergen Reactions  . Levaquin [Levofloxacin In D5w] Rash  . Niaspan [Niacin Er] Rash and Other (See Comments)    Flushing reaction  . Tramadol Rash    CURRENT MEDICATIONS:    Current Outpatient Medications  Medication Sig Dispense Refill  . FARXIGA 10 MG TABS tablet Take 10 mg by mouth daily.     Marland Kitchen gabapentin (NEURONTIN) 600 MG tablet Take 600 mg by mouth 2 (two) times daily.   1  . lovastatin (MEVACOR) 40 MG tablet Take 40 mg by mouth at bedtime.     . meloxicam (MOBIC) 15 MG tablet Take 15 mg by mouth daily.    . metFORMIN (GLUCOPHAGE) 500 MG tablet Take 1,000 mg by mouth 2 (two) times daily with a meal.   1  . rOPINIRole (REQUIP) 4 MG tablet Take 4 mg by mouth at bedtime.     . TRULICITY 1.5 0000000 SOPN Inject 1.5 mg into the skin once a week.     No current facility-administered medications for this  visit.    REVIEW OF SYSTEMS:   [X]  denotes positive finding, [ ]  denotes negative finding Cardiac  Comments:  Chest pain or chest pressure:    Shortness of breath upon exertion:    Short of breath when lying flat:    Irregular heart rhythm:        Vascular    Pain in calf, thigh, or hip brought on by ambulation:    Pain in feet at night that wakes you up from your sleep:     Blood clot in your veins:    Leg swelling:         Pulmonary    Oxygen at home:    Productive cough:     Wheezing:         Neurologic    Sudden weakness in arms or legs:     Sudden numbness in arms or legs:     Sudden onset of difficulty speaking or slurred speech:    Temporary loss of vision in one eye:     Problems with dizziness:         Gastrointestinal    Blood in stool:      Vomited blood:         Genitourinary    Burning when urinating:     Blood in urine:        Psychiatric    Major depression:         Hematologic    Bleeding problems:    Problems with blood clotting too easily:        Skin    Rashes or ulcers:        Constitutional    Fever or chills:     PHYSICAL EXAM:   Vitals:   07/03/19 1103  BP: (!) 146/67  Pulse: 77  Resp: 20  Temp: (!) 97.2 F (36.2 C)  SpO2: 99%  Weight: 179 lb (81.2 kg)  Height: 6\' 2"  (1.88 m)    GENERAL: The patient is a well-nourished male, in no acute distress. The vital signs are documented above. CARDIAC: There is a regular rate and rhythm.  VASCULAR: Nonpalpable pedal pulses PULMONARY: Nonlabored respirations ABDOMEN: Soft and non-tender with normal pitched  bowel sounds.  MUSCULOSKELETAL: There are no major deformities or cyanosis. NEUROLOGIC: No focal weakness or paresthesias are detected. SKIN: There are no ulcers or rashes noted. PSYCHIATRIC: The patient has a normal affect.  STUDIES:   I have reviewed his MRI.  I also reviewed his CT scan from several years ago.  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  We discussed a left lower quadrant transverse incision for anterior exposure of the L5-S1 disc space.  I discussed the risk and benefits of the procedure including the risk of injury to the ureter, iliac artery and vein.  We also discussed wound complications and possible hernia.  All of his questions were answered.   Leia Alf, MD, FACS [facility] [phone] Pager [phone]

## 2019-07-07 NOTE — Progress Notes (Signed)
[facility] [place], [place] AT [facility] OF [address] [address] Phone: [phone] Fax: [phone]  [facility] for [place], [place] [place] [address] Phone: [phone] Fax: [phone]      Your procedure is scheduled on Thursday, July 13, 2019.  Report to [facility] Main Entrance "A" at 5:30 A.M., and check in at the Admitting office.  Call this number if you have problems the morning of surgery:  [phone]  Call [phone] if you have any questions prior to your surgery date Monday-Friday 8am-4pm    Remember:  Do not eat or drink after midnight the night before your surgery     Take these medicines the morning of surgery with A SIP OF WATER:  gabapentin (NEURONTIN)   As of today, STOP taking any Aspirin (unless otherwise instructed by your surgeon) and Aspirin containing products, Aleve, Naproxen, Ibuprofen, Motrin, Advil, Goody's, BC's, all herbal medications, fish oil, and all vitamins.   WHAT DO I DO ABOUT MY DIABETES MEDICATION?   Marland Kitchen Do not take oral diabetes medicines (metFORMIN (GLUCOPHAGE)) the morning of surgery.  . Do not take FARXIGA the night before surgery OR the morning of surgery.  . The day of surgery, do not take other diabetes injectables, including Trulicity (dulaglutide).   HOW TO MANAGE YOUR DIABETES BEFORE AND AFTER SURGERY  Why is it important to control my blood sugar before and after surgery? . Improving blood sugar levels before and after surgery helps healing and can limit problems. . A way of improving blood sugar control is eating a healthy diet by: o  Eating less sugar and carbohydrates o  Increasing activity/exercise o  Talking with your doctor about reaching your blood sugar goals . High blood sugars (greater than 180 mg/dL) can raise your risk of infections and slow your recovery, so you  will need to focus on controlling your diabetes during the weeks before surgery. . Make sure that the doctor who takes care of your diabetes knows about your planned surgery including the date and location.  How do I manage my blood sugar before surgery? . Check your blood sugar at least 4 times a day, starting 2 days before surgery, to make sure that the level is not too high or low. . Check your blood sugar the morning of your surgery when you wake up and every 2 hours until you get to the Short Stay unit. o If your blood sugar is less than 70 mg/dL, you will need to treat for low blood sugar: - Do not take insulin. - Treat a low blood sugar (less than 70 mg/dL) with  cup of clear juice (cranberry or apple), 4 glucose tablets, OR glucose gel. - Recheck blood sugar in 15 minutes after treatment (to make sure it is greater than 70 mg/dL). If your blood sugar is not greater than 70 mg/dL on recheck, call [phone] for further instructions. . Report your blood sugar to the short stay nurse when you get to Short Stay.  . If you are admitted to the hospital after surgery: o Your blood sugar will be checked by the staff and you will probably be given insulin after surgery (instead of oral diabetes medicines) to make sure you have good blood sugar levels. o The goal for blood sugar control after surgery is 80-180 mg/dL.  Do not wear jewelry.            Do not wear lotions, powders, colognes, or deodorant.            Men may shave face and neck.            Do not bring valuables to the hospital.            [facility] is not responsible for any belongings or valuables.  Do NOT Smoke (Tobacco/Vapping) or drink Alcohol 24 hours prior to your procedure If you use a CPAP at night, you may bring all equipment for your overnight stay.   Contacts, glasses, dentures or bridgework may not be worn into surgery.      For patients admitted to the hospital, discharge time will be  determined by your treatment team.   Patients discharged the day of surgery will not be allowed to drive home, and someone needs to stay with them for 24 hours.    Special instructions:   [place]- Preparing For Surgery  Before surgery, you can play an important role. Because skin is not sterile, your skin needs to be as free of germs as possible. You can reduce the number of germs on your skin by washing with CHG (chlorahexidine gluconate) Soap before surgery.  CHG is an antiseptic cleaner which kills germs and bonds with the skin to continue killing germs even after washing.    Oral Hygiene is also important to reduce your risk of infection.  Remember - BRUSH YOUR TEETH THE MORNING OF SURGERY WITH YOUR REGULAR TOOTHPASTE  Please do not use if you have an allergy to CHG or antibacterial soaps. If your skin becomes reddened/irritated stop using the CHG.  Do not shave (including legs and underarms) for at least 48 hours prior to first CHG shower. It is OK to shave your face.  Please follow these instructions carefully.   1. Shower the NIGHT BEFORE SURGERY and the MORNING OF SURGERY with CHG Soap.   2. If you chose to wash your hair, wash your hair first as usual with your normal shampoo.  3. After you shampoo, rinse your hair and body thoroughly to remove the shampoo.  4. Use CHG as you would any other liquid soap. You can apply CHG directly to the skin and wash gently with a scrungie or a clean washcloth.   5. Apply the CHG Soap to your body ONLY FROM THE NECK DOWN.  Do not use on open wounds or open sores. Avoid contact with your eyes, ears, mouth and genitals (private parts). Wash Face and genitals (private parts)  with your normal soap.   6. Wash thoroughly, paying special attention to the area where your surgery will be performed.  7. Thoroughly rinse your body with warm water from the neck down.  8. DO NOT shower/wash with your normal soap after using and rinsing off the CHG  Soap.  9. Pat yourself dry with a CLEAN TOWEL.  10. Wear CLEAN PAJAMAS to bed the night before surgery, wear comfortable clothes the morning of surgery  11. Place CLEAN SHEETS on your bed the night of your first shower and DO NOT SLEEP WITH PETS.   Day of Surgery:   Do not apply any deodorants/lotions.  Please wear clean clothes to the hospital/surgery center.   Remember to brush your teeth WITH YOUR REGULAR TOOTHPASTE.   Please read over the following fact sheets that you were given.

## 2019-07-10 ENCOUNTER — Other Ambulatory Visit: Payer: Self-pay

## 2019-07-10 ENCOUNTER — Encounter (HOSPITAL_COMMUNITY): Payer: Self-pay

## 2019-07-10 ENCOUNTER — Other Ambulatory Visit (HOSPITAL_COMMUNITY)
Admission: RE | Admit: 2019-07-10 | Discharge: 2019-07-10 | Disposition: A | Discharge status: Home / Self Care | Payer: Medicare PPO | Source: Ambulatory Visit | Attending: Neurosurgery | Admitting: Neurosurgery

## 2019-07-10 ENCOUNTER — Encounter (HOSPITAL_COMMUNITY)
Admission: RE | Admit: 2019-07-10 | Discharge: 2019-07-10 | Disposition: A | Discharge status: Home / Self Care | Payer: Medicare PPO | Source: Ambulatory Visit | Attending: Neurosurgery | Admitting: Neurosurgery

## 2019-07-10 DIAGNOSIS — I491 Atrial premature depolarization: Secondary | ICD-10-CM | POA: Insufficient documentation

## 2019-07-10 DIAGNOSIS — F17200 Nicotine dependence, unspecified, uncomplicated: Secondary | ICD-10-CM | POA: Insufficient documentation

## 2019-07-10 DIAGNOSIS — G4700 Insomnia, unspecified: Secondary | ICD-10-CM | POA: Insufficient documentation

## 2019-07-10 DIAGNOSIS — E785 Hyperlipidemia, unspecified: Secondary | ICD-10-CM | POA: Insufficient documentation

## 2019-07-10 DIAGNOSIS — Z7984 Long term (current) use of oral hypoglycemic drugs: Secondary | ICD-10-CM | POA: Insufficient documentation

## 2019-07-10 DIAGNOSIS — G2581 Restless legs syndrome: Secondary | ICD-10-CM | POA: Insufficient documentation

## 2019-07-10 DIAGNOSIS — E1142 Type 2 diabetes mellitus with diabetic polyneuropathy: Secondary | ICD-10-CM | POA: Insufficient documentation

## 2019-07-10 DIAGNOSIS — Z01818 Encounter for other preprocedural examination: Secondary | ICD-10-CM | POA: Insufficient documentation

## 2019-07-10 DIAGNOSIS — Z20822 Contact with and (suspected) exposure to covid-19: Secondary | ICD-10-CM | POA: Insufficient documentation

## 2019-07-10 DIAGNOSIS — Z8546 Personal history of malignant neoplasm of prostate: Secondary | ICD-10-CM | POA: Insufficient documentation

## 2019-07-10 DIAGNOSIS — M48061 Spinal stenosis, lumbar region without neurogenic claudication: Secondary | ICD-10-CM | POA: Insufficient documentation

## 2019-07-10 DIAGNOSIS — I10 Essential (primary) hypertension: Secondary | ICD-10-CM | POA: Insufficient documentation

## 2019-07-10 DIAGNOSIS — Z79899 Other long term (current) drug therapy: Secondary | ICD-10-CM | POA: Insufficient documentation

## 2019-07-10 LAB — BASIC METABOLIC PANEL
Anion gap: 10 (ref 5–15)
BUN: 21 mg/dL (ref 8–23)
CO2: 28 mmol/L (ref 22–32)
Calcium: 9.7 mg/dL (ref 8.9–10.3)
Chloride: 102 mmol/L (ref 98–111)
Creatinine, Ser: 1.19 mg/dL (ref 0.61–1.24)
GFR calc Af Amer: >60 mL/min (ref >60)
GFR calc non Af Amer: 58 mL/min — ABNORMAL LOW (ref >60)
Glucose, Bld: 133 mg/dL — ABNORMAL HIGH (ref 70–99)
Potassium: 4.9 mmol/L (ref 3.5–5.1)
Sodium: 140 mmol/L (ref 135–145)

## 2019-07-10 LAB — CBC
HCT: 43.8 % (ref 39.0–52.0)
Hemoglobin: 14 g/dL (ref 13.0–17.0)
MCH: 31.1 pg (ref 26.0–34.0)
MCHC: 32 g/dL (ref 30.0–36.0)
MCV: 97.3 fL (ref 80.0–100.0)
Platelets: 191 10*3/uL (ref 150–400)
RBC: 4.5 MIL/uL (ref 4.22–5.81)
RDW: 13.3 % (ref 11.5–15.5)
WBC: 5.9 10*3/uL (ref 4.0–10.5)
nRBC: 0 % (ref 0.0–0.2)

## 2019-07-10 LAB — HEMOGLOBIN A1C
Hgb A1c MFr Bld: 7.6 % — ABNORMAL HIGH (ref 4.8–5.6)
Mean Plasma Glucose: 171.42 mg/dL

## 2019-07-10 LAB — ABO/RH: ABO/RH(D): O POS

## 2019-07-10 LAB — GLUCOSE, CAPILLARY: Glucose-Capillary: 129 mg/dL — ABNORMAL HIGH (ref 70–99)

## 2019-07-10 LAB — TYPE AND SCREEN
ABO/RH(D): O POS
Antibody Screen: NEGATIVE

## 2019-07-10 LAB — SURGICAL PCR SCREEN
MRSA, PCR: NEGATIVE
Staphylococcus aureus: POSITIVE — AB

## 2019-07-10 LAB — SARS CORONAVIRUS 2 (TAT 6-24 HRS): SARS Coronavirus 2: NEGATIVE

## 2019-07-10 NOTE — Progress Notes (Signed)
PCP - Dr. Forde Dandy Cardiologist - no  EKG - today (called Dr. Baldwin [facility] office for one to compare with and they do not have an EKG in pt's record)  Fasting Blood Sugar - around 130 Checks Blood Sugar ___1__ times a day Last A1C was in January and it was 7.2 per pt, had one done today and it was 7.6  COVID TEST- today   Anesthesia review: yes, EKG  Patient denies shortness of breath, fever, cough and chest pain at PAT appointment   All instructions explained to the patient, with a verbal understanding of the material. Patient agrees to go over the instructions while at home for a better understanding. Patient also instructed to self quarantine after being tested for COVID-19. The opportunity to ask questions was provided.

## 2019-07-10 NOTE — Pre-Procedure Instructions (Addendum)
Harry Zamora [facility]  07/10/2019    Your procedure is scheduled on Thursday, July 13, 2019 at 7:30 AM.   Report to [facility] Entrance "A" Admitting Office at 5:30 AM.   Call this number if you have problems the morning of surgery: [phone]   Questions prior to day of surgery, please call [phone] between 8 & 4 PM.   Remember:  Do not eat or drink after midnight Wednesday, 07/12/19.  Take these medicines the morning of surgery with A SIP OF WATER: Gabapentin (Neurontin)  Stop NSAIDS (Meloxicam, Mobic, Ibuprofen, Aleve, etc) as of today prior to surgery. Do not use Aspirin products (BC Powders, Goody's, etc), Multivitamins, Herbal medications or Fish oil prior to surgery.  Do not take Farxiga Wednesday or Thursday AM. Do not take Metformin the morning of surgery.   How to Manage Your Diabetes Before Surgery   Why is it important to control my blood sugar before and after surgery?   Improving blood sugar levels before and after surgery helps healing and can limit problems.  A way of improving blood sugar control is eating a healthy diet by:  - Eating less sugar and carbohydrates  - Increasing activity/exercise  - Talk with your doctor about reaching your blood sugar goals  High blood sugars (greater than 180 mg/dL) can raise your risk of infections and slow down your recovery so you will need to focus on controlling your diabetes during the weeks before surgery.  Make sure that the doctor who takes care of your diabetes knows about your planned surgery including the date and location.  How do I manage my blood sugars before surgery?   Check your blood sugar at least 4 times a day, 2 days before surgery to make sure that they are not too high or low.  Check your blood sugar the morning of your surgery when you wake up and every 2 hours until you get to the Short-Stay unit.  Treat a low blood sugar (less than 70 mg/dL) with 1/2 cup of clear juice (cranberry or  apple), 4 glucose tablets, OR glucose gel.  Recheck blood sugar in 15 minutes after treatment (to make sure it is greater than 70 mg/dL).  If blood sugar is not greater than 70 mg/dL on re-check, call [phone] for further instructions.   Report your blood sugar to the Short-Stay nurse when you get to Short-Stay.  References:  [facility], 2007 "How to Manage your Diabetes Before and After Surgery".  Do not smoke 24 hours prior to surgery.   Do not wear jewelry, make-up or nail polish.  Do not wear lotions, powders, cologne or deodorant.  Men may shave face and neck.  Do not bring valuables to the hospital.  [facility] is not responsible for any belongings or valuables.  Contacts, dentures or bridgework may not be worn into surgery.  Leave your suitcase in the car.  After surgery it may be brought to your room.  For patients admitted to the hospital, discharge time will be determined by your treatment team.  [facility] - Preparing for Surgery  Before surgery, you can play an important role.  Because skin is not sterile, your skin needs to be as free of germs as possible.  You can reduce the number of germs on you skin by washing with CHG (chlorahexidine gluconate) soap before surgery.  CHG is an antiseptic cleaner which kills germs and bonds with the skin to continue killing germs  even after washing.  Oral Hygiene is also important in reducing the risk of infection.  Remember to brush your teeth with your regular toothpaste the morning of surgery.  Please DO NOT use if you have an allergy to CHG or antibacterial soaps.  If your skin becomes reddened/irritated stop using the CHG and inform your nurse when you arrive at Short Stay.  Do not shave (including legs and underarms) for at least 48 hours prior to the first CHG shower.  You may shave your face.  Please follow these instructions carefully:   1.  Shower with CHG Soap the night before surgery and the  morning of Surgery.  2.  If you choose to wash your hair, wash your hair first as usual with your normal shampoo.  3.  After you shampoo, rinse your hair and body thoroughly to remove the shampoo. 4.  Use CHG as you would any other liquid soap.  You can apply chg directly to the skin and wash gently with a      scrungie or washcloth.           5.  Apply the CHG Soap to your body ONLY FROM THE NECK DOWN.   Do not use on open wounds or open sores. Avoid contact with your eyes, ears, mouth and genitals (private parts).  Wash genitals (private parts) with your normal soap - do this prior to using CHG soap.  6.  Wash thoroughly, paying special attention to the area where your surgery will be performed.  7.  Thoroughly rinse your body with warm water from the neck down.  8.  DO NOT shower/wash with your normal soap after using and rinsing off the CHG Soap.  9.  Pat yourself dry with a clean towel.            10.  Wear clean pajamas.            11.  Place clean sheets on your bed the night of your first shower and do not sleep with pets.  Day of Surgery  Shower as above. Do not apply any lotions/deodorants the morning of surgery.   Please wear clean clothes to the hospital. Remember to brush your teeth with toothpaste.  Please read over the fact sheets that you were given.

## 2019-07-11 NOTE — Anesthesia Preprocedure Evaluation (Addendum)
Anesthesia Evaluation  Patient identified by MRN, date of birth, ID band Patient awake    Reviewed: Allergy & Precautions, NPO status , Patient's Chart, lab work & pertinent test results  History of Anesthesia Complications Negative for: history of anesthetic complications  Airway Mallampati: II  TM Distance: >3 FB Neck ROM: Full    Dental  (+) Dental Advisory Given   Pulmonary neg shortness of breath, neg COPD, neg recent URI, Current Smoker and Patient abstained from smoking.,    breath sounds clear to auscultation       Cardiovascular hypertension, (-) angina(-) Past MI and (-) CHF (-) dysrhythmias  Rhythm:Regular     Neuro/Psych PSYCHIATRIC DISORDERS Depression Back pain on opioids  Neuromuscular disease    GI/Hepatic negative GI ROS, Neg liver ROS,   Endo/Other  diabetes, Oral Hypoglycemic Agents  Renal/GU      Musculoskeletal  (+) Arthritis ,   Abdominal   Peds  Hematology negative hematology ROS (+)   Anesthesia Other Findings   Reproductive/Obstetrics                           Anesthesia Physical Anesthesia Plan  ASA: II  Anesthesia Plan: General   Post-op Pain Management:    Induction: Intravenous  PONV Risk Score and Plan: 1 and Treatment may vary due to age or medical condition, Ondansetron, Propofol infusion and Dexamethasone  Airway Management Planned: Oral ETT  Additional Equipment: Arterial line  Intra-op Plan:   Post-operative Plan: Extubation in OR  Informed Consent: I have reviewed the patients History and Physical, chart, labs and discussed the procedure including the risks, benefits and alternatives for the proposed anesthesia with the patient or authorized representative who has indicated his/her understanding and acceptance.     Dental advisory given  Plan Discussed with: CRNA and Surgeon  Anesthesia Plan Comments: (PAT note written 07/11/2019 by  Myra Gianotti, PA-C. )       Anesthesia Quick Evaluation

## 2019-07-11 NOTE — Progress Notes (Signed)
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  History includes smoking, HLD, DM2 (polyneuropathy), HTN, prostate cancer (2003, s/p surgery), gynecomastia, RLS, insomnia, back pain, spinal surgery (L3-S1 laminectomy 10/02/16).  EKG showed SR, bigeminy PACs. He denied chest pain and SOB at PAT RN visit. A1c 7.6%. 07/10/19 presurgical COVID-19 test negative. Anesthesia team to evaluate on the day of surgery.    VS: BP (!) 161/81   Pulse 77   Temp (!) 36.3 C   Resp 18   Wt 80.6 kg   SpO2 99%   BMI 22.80 kg/m    PROVIDERS: [name], MD is PCP ([place])   LABS: Labs reviewed: Acceptable for surgery. (all labs ordered are listed, but only abnormal results are displayed)  Labs Reviewed  SURGICAL PCR SCREEN - Abnormal; Notable for the following components:      Result Value   Staphylococcus aureus POSITIVE (*)    All other components within normal limits  GLUCOSE, CAPILLARY - Abnormal; Notable for the following components:   Glucose-Capillary 129 (*)    All other components within normal limits  HEMOGLOBIN A1C - Abnormal; Notable for the following components:   Hgb A1c MFr Bld 7.6 (*)    All other components within normal limits  BASIC METABOLIC PANEL - Abnormal; Notable for the following components:   Glucose, Bld 133 (*)    GFR calc non Af Amer 58 (*)    All other components within normal  limits  CBC  TYPE AND SCREEN  ABO/RH     IMAGES: MRI L-spine 05/09/19: IMPRESSION: Postoperative and degenerative changes as detailed above. There is no high-grade canal stenosis. Lateral recess stenosis is greatest at L4-L5. Foraminal stenosis is present from L3-L4 through L5-S1. Overall appearance is similar to 2020 examination.   EKG: 07/10/19: Sinus rhythm with Premature atrial complexes in a pattern of bigeminy Otherwise normal ECG Confirmed by Fransico Him ([id number]) on 07/10/2019 9:00:04 PM - PACs are new when compared to 09/23/16 tracing   CV: N/A  Past Medical History:  Diagnosis Date  . Anemia    as an infant  . Arthritis   . Back pain   . Depression   . Disc displacement, lumbar   . Gynecomastia   . Hyperlipidemia   . Hypertension    no longer on medications  . Hypertriglyceridemia   . Insomnia   . Low back pain   . Lumbar radiculopathy   . Lumbar stenosis   . OA (osteoarthritis)   . Polyneuropathy in diabetes(357.2)   . Prostate cancer ([place]) 2003  . Restless legs   . Ringing in ears   . RLS (restless legs syndrome)   . Type 2 diabetes mellitus ([place])     Past Surgical History:  Procedure Laterality Date  . COLONOSCOPY    . GALLBLADDER SURGERY    .  [place]  . LUMBAR LAMINECTOMY/DECOMPRESSION MICRODISCECTOMY N/A 10/02/2016   Procedure: L3 to S1 Laminectomy;  Surgeon: Erline Levine, MD;  Location: [facility];  Service: Neurosurgery;  Laterality: N/A;  L3 to S1 Laminectomy  . MENISCUS REPAIR Right   . PROSTATE SURGERY    . SHOULDER SURGERY Left 01/2011   rotator cuff    MEDICATIONS: . FARXIGA 10 MG TABS tablet  . gabapentin (NEURONTIN) 600 MG tablet  . lovastatin (MEVACOR) 40 MG tablet  . meloxicam (MOBIC) 15 MG tablet  . metFORMIN (GLUCOPHAGE) 500 MG tablet  . rOPINIRole (REQUIP) 4 MG tablet  . TRULICITY 1.5 0000000 SOPN   No current facility-administered medications for this encounter.    Myra Gianotti, PA-C Surgical Short  Stay/Anesthesiology [facility] Phone [phone] [facility] Phone [phone] 07/11/2019 10:50 AM

## 2019-07-13 ENCOUNTER — Inpatient Hospital Stay (HOSPITAL_COMMUNITY): Payer: Medicare PPO

## 2019-07-13 ENCOUNTER — Inpatient Hospital Stay (HOSPITAL_COMMUNITY): Payer: Medicare PPO | Admitting: Certified Registered"

## 2019-07-13 ENCOUNTER — Inpatient Hospital Stay (HOSPITAL_COMMUNITY)
Admission: RE | Admit: 2019-07-13 | Discharge: 2019-07-15 | DRG: 460 | Disposition: A | Discharge status: Home / Self Care | Payer: Medicare PPO | Attending: Neurosurgery | Admitting: Neurosurgery

## 2019-07-13 ENCOUNTER — Encounter (HOSPITAL_COMMUNITY): Payer: Self-pay | Admitting: Neurosurgery

## 2019-07-13 ENCOUNTER — Encounter (HOSPITAL_COMMUNITY)
Admission: RE | Disposition: A | Discharge status: Home / Self Care | Payer: Self-pay | Source: Home / Self Care | Attending: Neurosurgery

## 2019-07-13 ENCOUNTER — Other Ambulatory Visit: Payer: Self-pay

## 2019-07-13 ENCOUNTER — Inpatient Hospital Stay (HOSPITAL_COMMUNITY): Payer: Medicare PPO | Admitting: Vascular Surgery

## 2019-07-13 DIAGNOSIS — E119 Type 2 diabetes mellitus without complications: Secondary | ICD-10-CM | POA: Diagnosis present

## 2019-07-13 DIAGNOSIS — Z419 Encounter for procedure for purposes other than remedying health state, unspecified: Secondary | ICD-10-CM

## 2019-07-13 DIAGNOSIS — Z7984 Long term (current) use of oral hypoglycemic drugs: Secondary | ICD-10-CM

## 2019-07-13 DIAGNOSIS — I10 Essential (primary) hypertension: Secondary | ICD-10-CM | POA: Diagnosis present

## 2019-07-13 DIAGNOSIS — M4316 Spondylolisthesis, lumbar region: Secondary | ICD-10-CM | POA: Diagnosis present

## 2019-07-13 DIAGNOSIS — M48062 Spinal stenosis, lumbar region with neurogenic claudication: Principal | ICD-10-CM | POA: Diagnosis present

## 2019-07-13 DIAGNOSIS — Z8546 Personal history of malignant neoplasm of prostate: Secondary | ICD-10-CM

## 2019-07-13 DIAGNOSIS — M5116 Intervertebral disc disorders with radiculopathy, lumbar region: Secondary | ICD-10-CM | POA: Diagnosis present

## 2019-07-13 DIAGNOSIS — Z20822 Contact with and (suspected) exposure to covid-19: Secondary | ICD-10-CM | POA: Diagnosis present

## 2019-07-13 DIAGNOSIS — M4317 Spondylolisthesis, lumbosacral region: Secondary | ICD-10-CM

## 2019-07-13 DIAGNOSIS — M5417 Radiculopathy, lumbosacral region: Secondary | ICD-10-CM

## 2019-07-13 DIAGNOSIS — M5117 Intervertebral disc disorders with radiculopathy, lumbosacral region: Secondary | ICD-10-CM | POA: Diagnosis present

## 2019-07-13 HISTORY — PX: ANTERIOR LAT LUMBAR FUSION: SHX1168

## 2019-07-13 HISTORY — PX: ANTERIOR LUMBAR FUSION: SHX1170

## 2019-07-13 HISTORY — PX: LUMBAR PERCUTANEOUS PEDICLE SCREW 3 LEVEL: SHX5562

## 2019-07-13 HISTORY — PX: ABDOMINAL EXPOSURE: SHX5708

## 2019-07-13 LAB — GLUCOSE, CAPILLARY
Glucose-Capillary: 148 mg/dL — ABNORMAL HIGH (ref 70–99)
Glucose-Capillary: 157 mg/dL — ABNORMAL HIGH (ref 70–99)
Glucose-Capillary: 217 mg/dL — ABNORMAL HIGH (ref 70–99)

## 2019-07-13 IMAGING — RF DG LUMBAR SPINE 2-3V
1 series · 8 of 8 positions shown · non-contrast
Comparison: [DATE]

CLINICAL DATA: Lumbar fusion

EXAM:
LUMBAR SPINE - 2-3 VIEW; DG C-ARM 1-60 MIN

[Series 1: run · 8 of 8 slices shown]
[im 1/8]
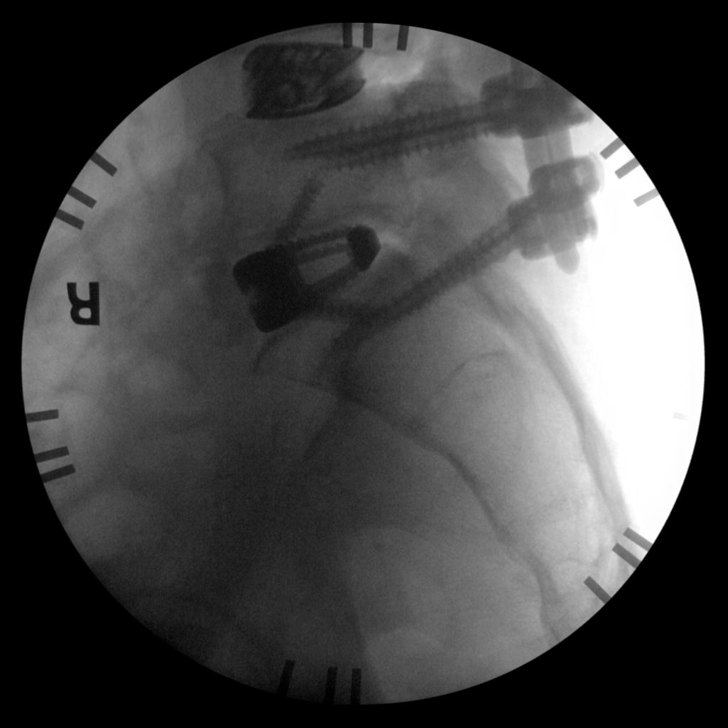
[im 2/8]
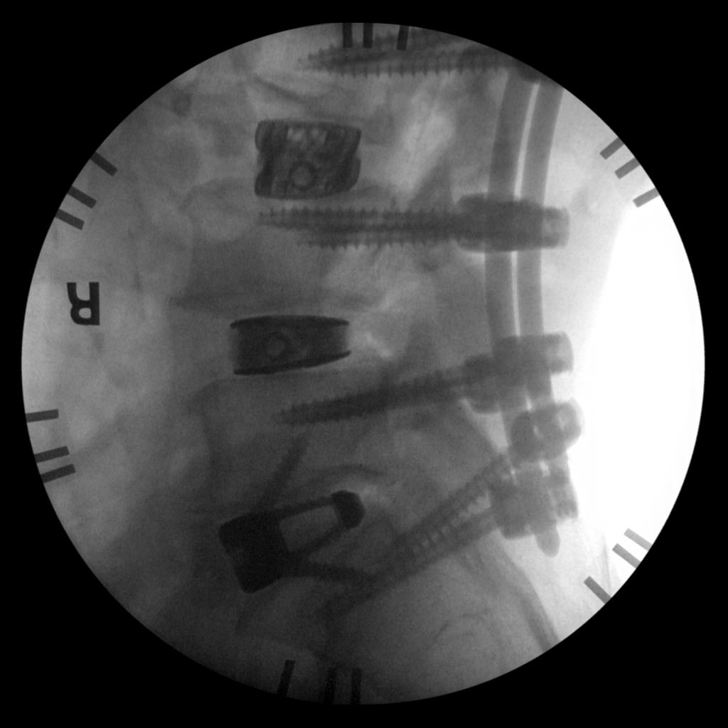
[im 3/8]
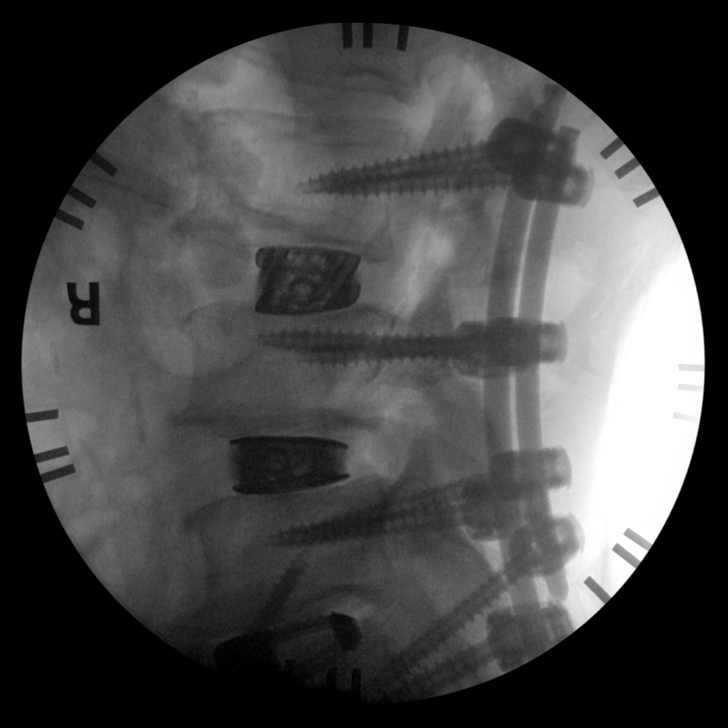
[im 4/8]
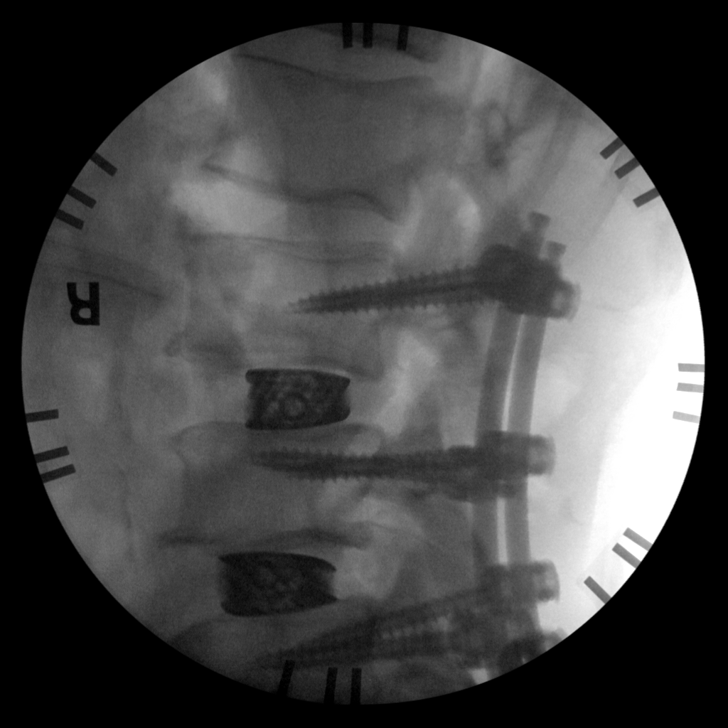
[im 5/8]
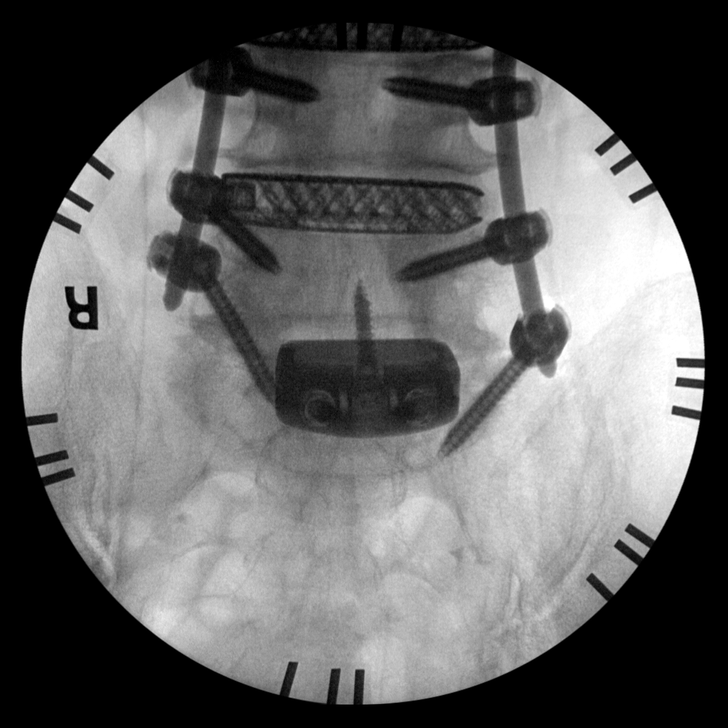
[im 6/8]
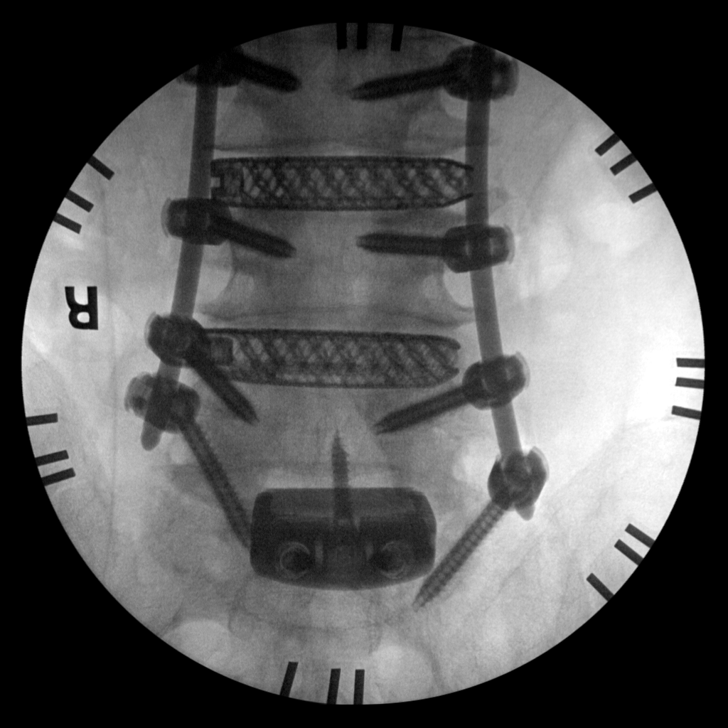
[im 7/8]
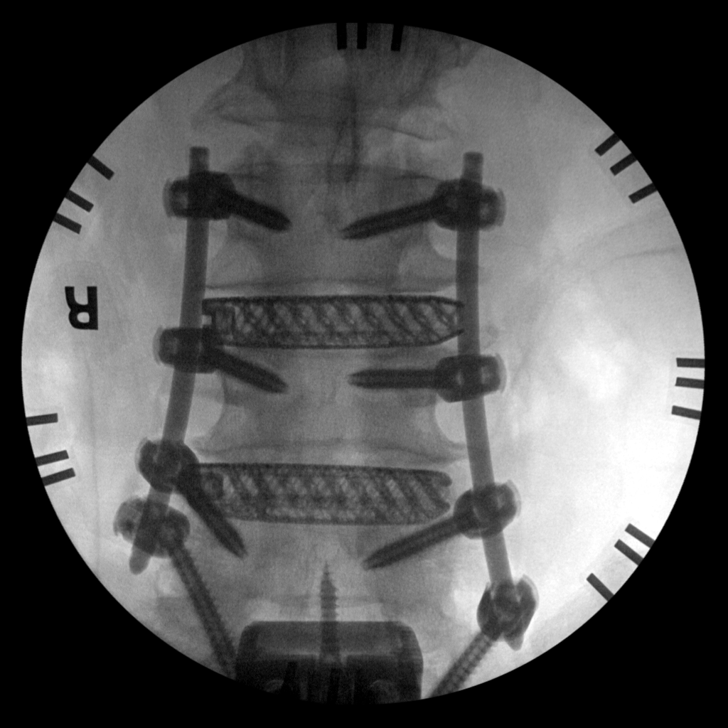
[im 8/8]
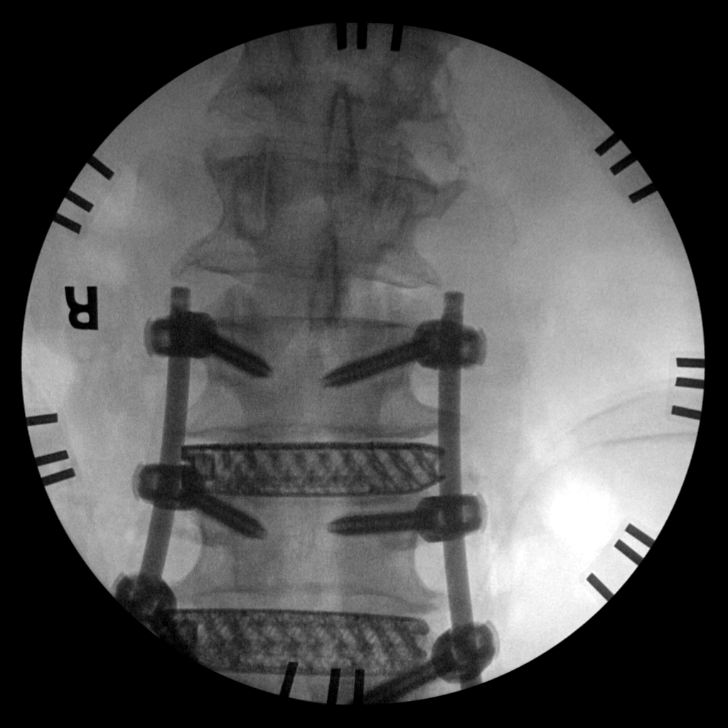

[8 of 8 positions shown; findings below may reference images not displayed]

FLUOROSCOPY TIME:  Fluoroscopy Time:  4 minutes 31 seconds

Radiation Exposure Index (if provided by the fluoroscopic device):
Not available

Number of Acquired Spot Images: 8
FINDINGS: Interbody fusion is noted at L3-4, L4-5 and L5-S1. Pedicle screws
are noted at all 4 levels with posterior fixation. Numbering
nomenclature is similar to that utilized on prior MRI.
IMPRESSION: Lumbar fusion from L3-S1.

## 2019-07-13 IMAGING — RF DG C-ARM 1-60 MIN
1 series · 8 of 8 positions shown · non-contrast
Comparison: [DATE]

CLINICAL DATA: Lumbar fusion

EXAM:
LUMBAR SPINE - 2-3 VIEW; DG C-ARM 1-60 MIN

[Series 1: run · 8 of 8 slices shown]
[im 1/8]
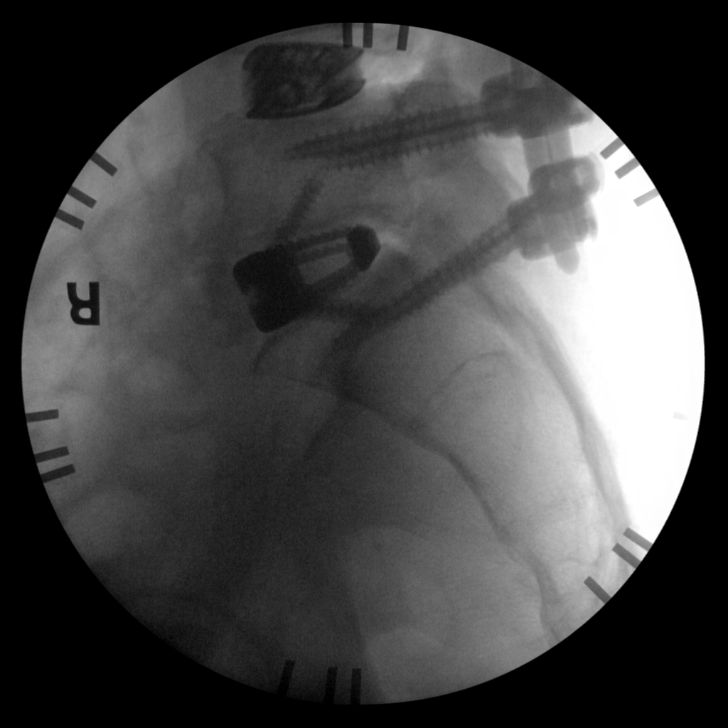
[im 2/8]
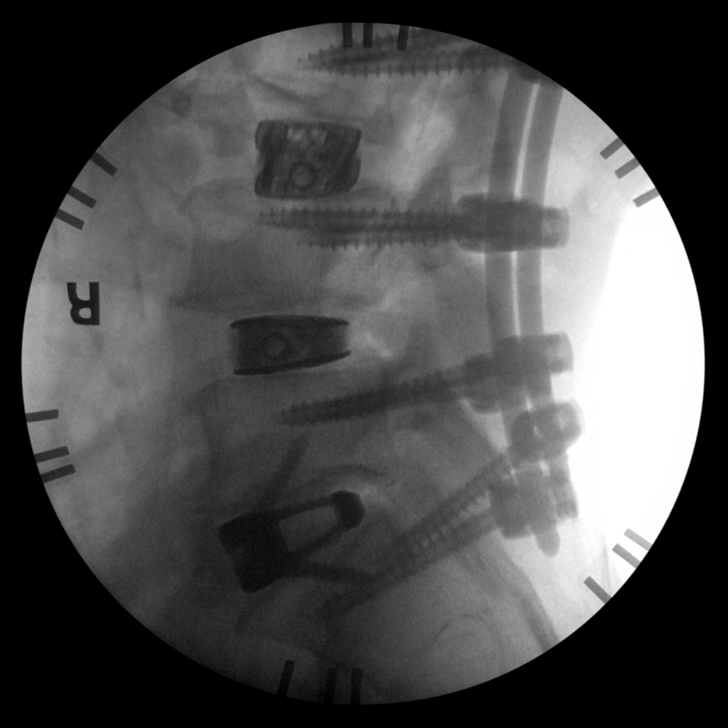
[im 3/8]
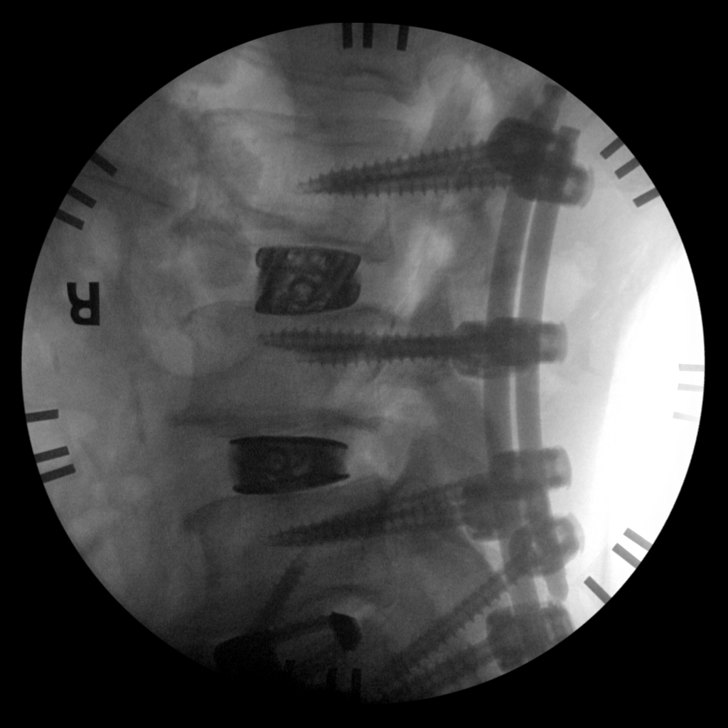
[im 4/8]
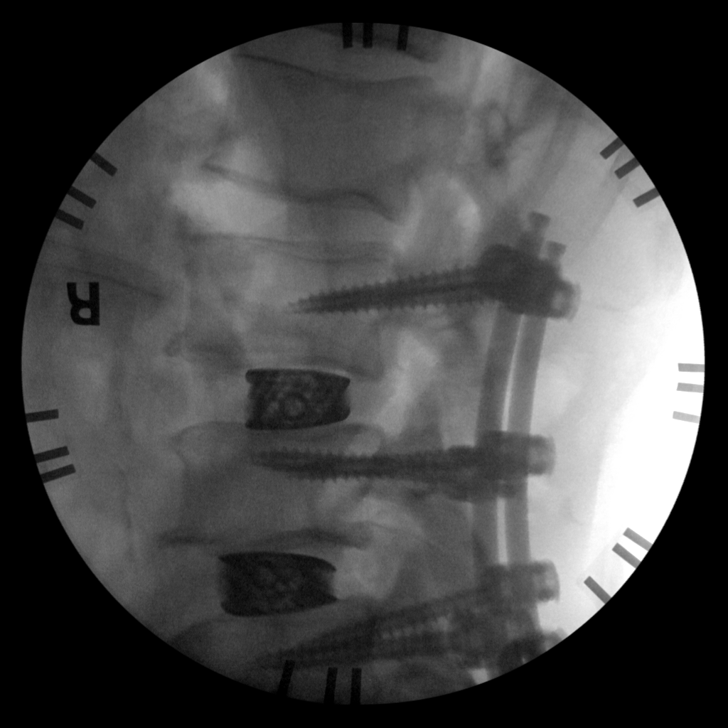
[im 5/8]
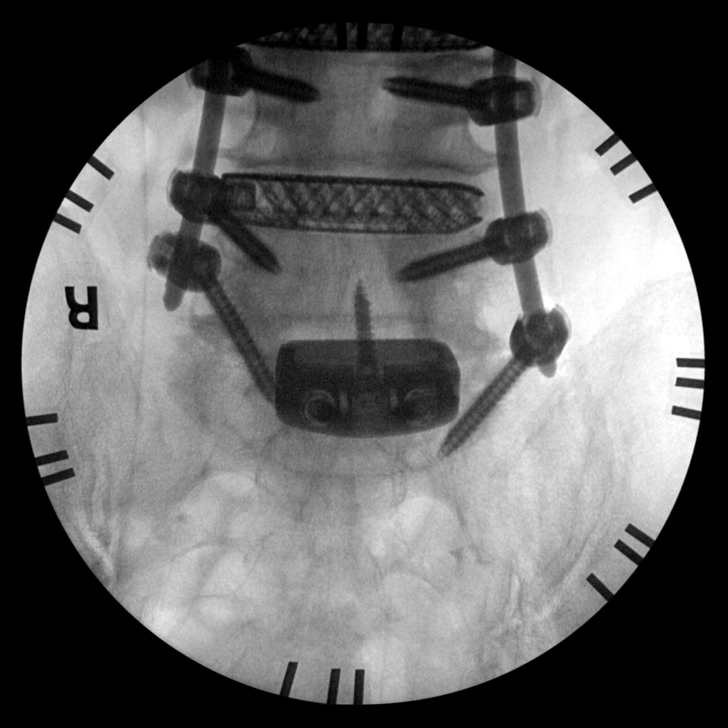
[im 6/8]
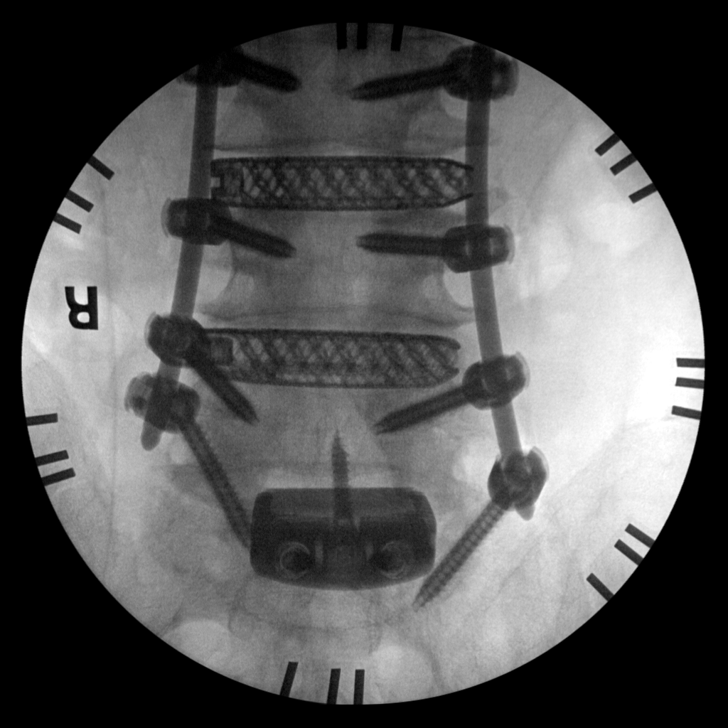
[im 7/8]
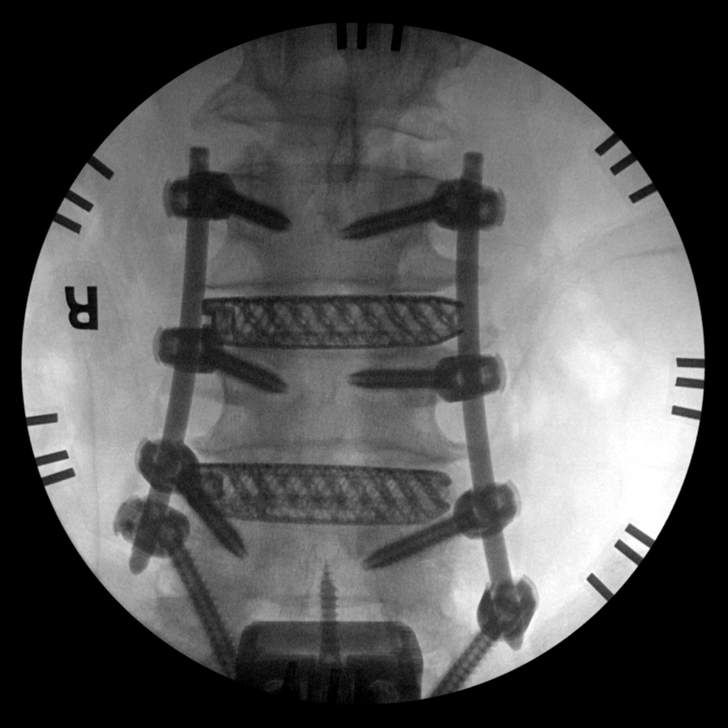
[im 8/8]
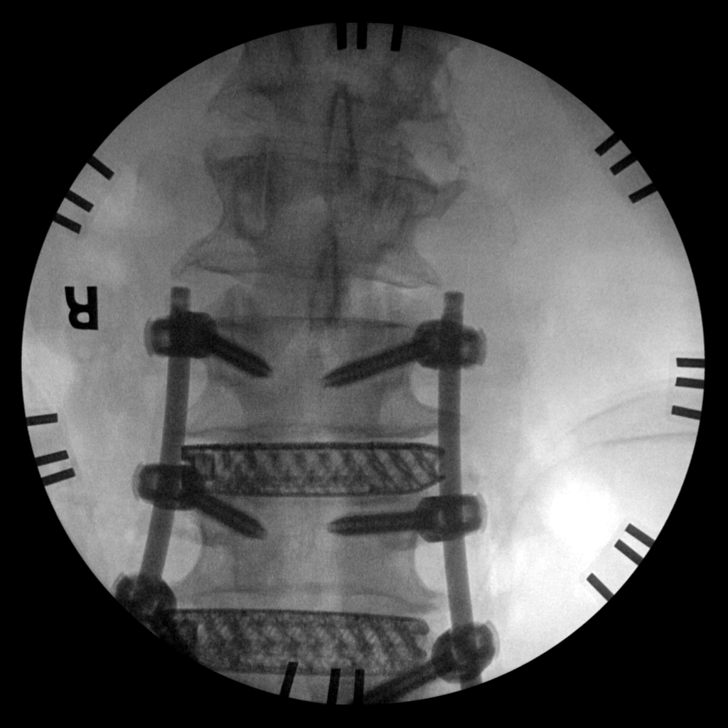

[8 of 8 positions shown; findings below may reference images not displayed]

FLUOROSCOPY TIME:  Fluoroscopy Time:  4 minutes 31 seconds

Radiation Exposure Index (if provided by the fluoroscopic device):
Not available

Number of Acquired Spot Images: 8
FINDINGS: Interbody fusion is noted at L3-4, L4-5 and L5-S1. Pedicle screws
are noted at all 4 levels with posterior fixation. Numbering
nomenclature is similar to that utilized on prior MRI.
IMPRESSION: Lumbar fusion from L3-S1.

## 2019-07-13 IMAGING — CR DG OR LOCAL ABDOMEN
1 series · 1 of 1 positions shown · non-contrast
Comparison: None.

CLINICAL DATA: Status post lumbar surgery.

EXAM:
OR LOCAL ABDOMEN

[AP]
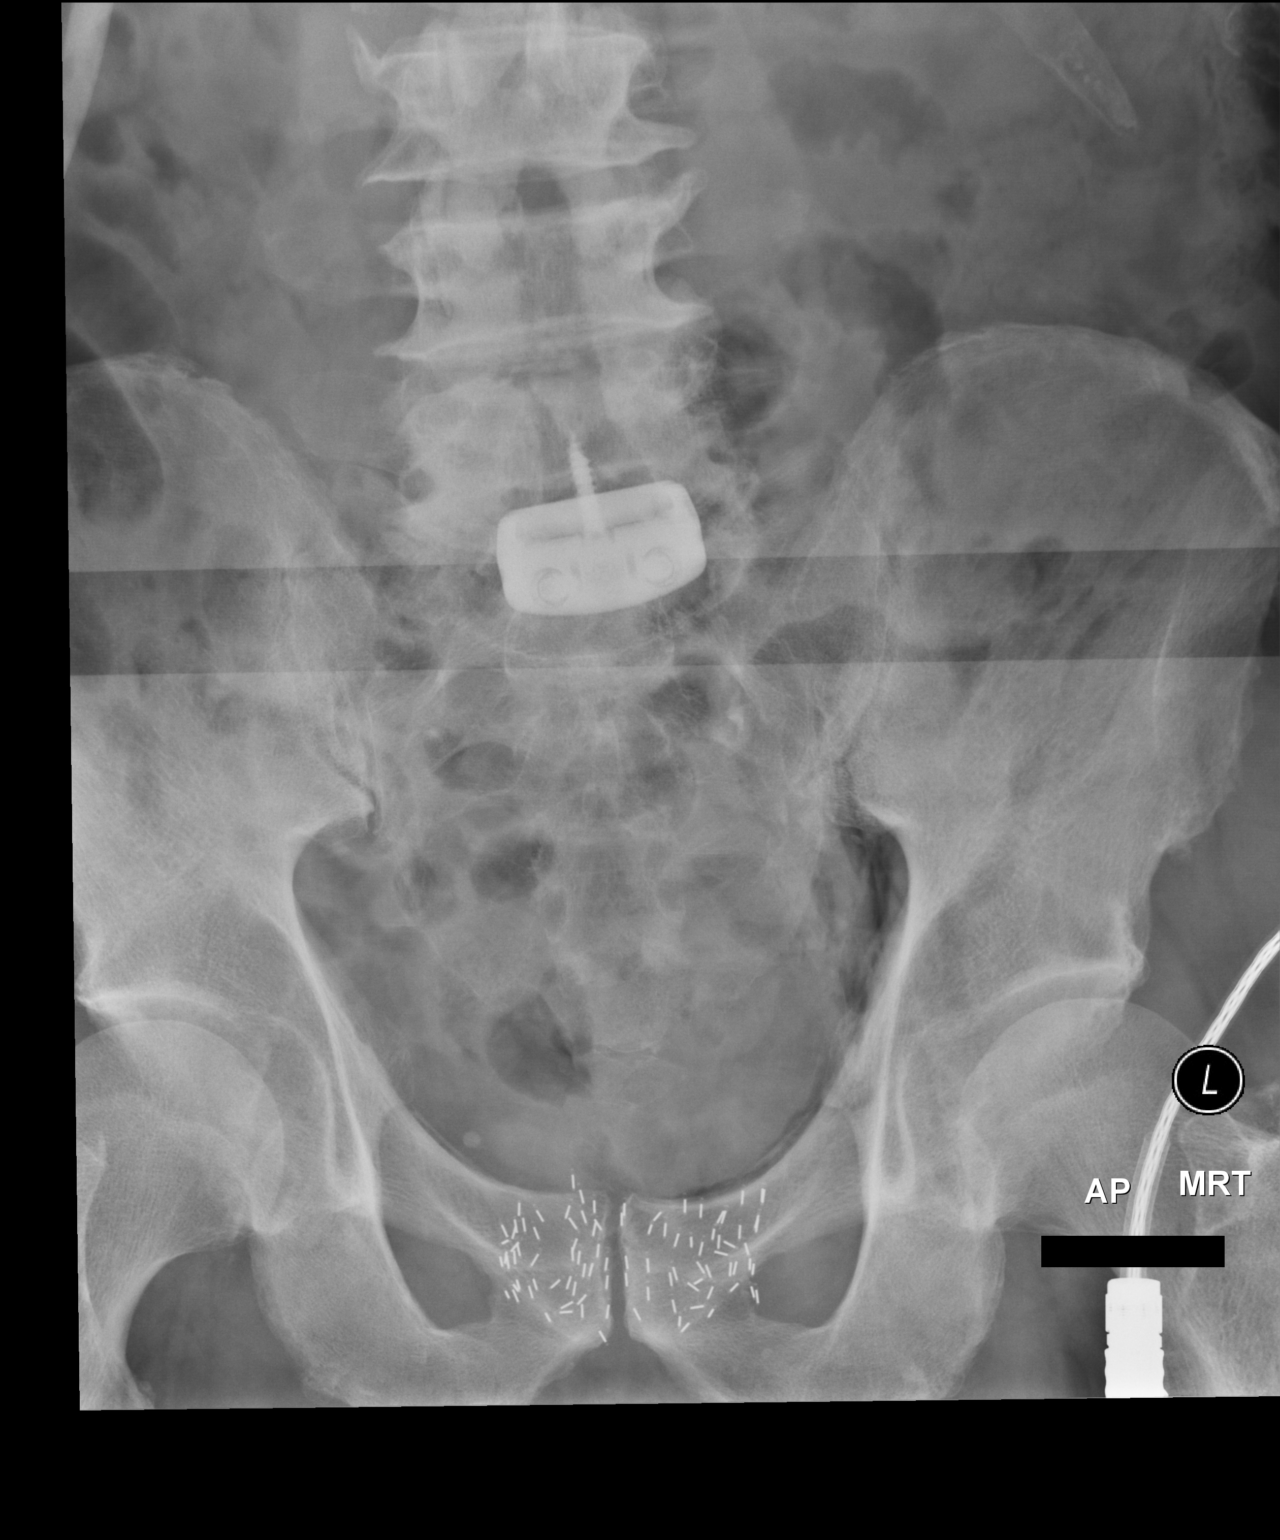

[1 of 1 positions shown; findings below may reference images not displayed]

FINDINGS: The bowel gas pattern is normal. Status post prostatic brachytherapy
seed placement. Status post surgical anterior fusion of L5-S1. No
other radiopaque foreign body is noted.
IMPRESSION: Status post prostatic brachytherapy seed placement and anterior
fusion of L5-S1. No other radiopaque foreign body is noted. These
results were called by telephone at the time of interpretation on
acknowledged these results.

## 2019-07-13 SURGERY — ANTERIOR LUMBAR FUSION 1 LEVEL
Anesthesia: General | Site: Spine Lumbar | Laterality: Right

## 2019-07-13 MED ORDER — HYDROCODONE-ACETAMINOPHEN 5-325 MG PO TABS
1.0000 | ORAL_TABLET | ORAL | Status: DC | PRN
  Administered 2019-07-13 – 2019-07-14 (×3): 2 via ORAL
  Filled 2019-07-13 (×3): qty 2

## 2019-07-13 MED ORDER — INSULIN ASPART 100 UNIT/ML ~~LOC~~ SOLN
0.0000 [IU] | Freq: Three times a day (TID) | SUBCUTANEOUS | Status: DC
  Administered 2019-07-14: 3 [IU] via SUBCUTANEOUS
  Administered 2019-07-14 – 2019-07-15 (×2): 2 [IU] via SUBCUTANEOUS

## 2019-07-13 MED ORDER — PROPOFOL 10 MG/ML IV BOLUS
INTRAVENOUS | Status: AC
  Filled 2019-07-13: qty 20

## 2019-07-13 MED ORDER — METHOCARBAMOL 1000 MG/10ML IJ SOLN
500.0000 mg | Freq: Four times a day (QID) | INTRAVENOUS | Status: DC | PRN
  Filled 2019-07-13: qty 5

## 2019-07-13 MED ORDER — ZOLPIDEM TARTRATE 5 MG PO TABS: 5.0000 mg | ORAL_TABLET | Freq: Every evening | ORAL | Status: DC | PRN

## 2019-07-13 MED ORDER — ROPINIROLE HCL 1 MG PO TABS
4.0000 mg | ORAL_TABLET | Freq: Every day | ORAL | Status: DC
  Administered 2019-07-13 – 2019-07-14 (×2): 4 mg via ORAL
  Filled 2019-07-13 (×2): qty 4

## 2019-07-13 MED ORDER — SUFENTANIL CITRATE 50 MCG/ML IV SOLN
INTRAVENOUS | Status: DC | PRN
  Administered 2019-07-13: 5 ug via INTRAVENOUS
  Administered 2019-07-13: 10 ug via INTRAVENOUS
  Administered 2019-07-13 (×2): 5 ug via INTRAVENOUS

## 2019-07-13 MED ORDER — ONDANSETRON HCL 4 MG/2ML IJ SOLN
INTRAMUSCULAR | Status: AC
  Filled 2019-07-13: qty 2

## 2019-07-13 MED ORDER — KCL IN DEXTROSE-NACL 20-5-0.45 MEQ/L-%-% IV SOLN
INTRAVENOUS | Status: DC
  Filled 2019-07-13: qty 1000

## 2019-07-13 MED ORDER — CEFAZOLIN SODIUM-DEXTROSE 2-4 GM/100ML-% IV SOLN
INTRAVENOUS | Status: AC
  Filled 2019-07-13: qty 100

## 2019-07-13 MED ORDER — LIDOCAINE-EPINEPHRINE 1 %-1:100000 IJ SOLN
INTRAMUSCULAR | Status: AC
  Filled 2019-07-13: qty 1

## 2019-07-13 MED ORDER — LIDOCAINE 2% (20 MG/ML) 5 ML SYRINGE
INTRAMUSCULAR | Status: DC | PRN
  Administered 2019-07-13: 60 mg via INTRAVENOUS

## 2019-07-13 MED ORDER — OXYCODONE HCL 5 MG/5ML PO SOLN: 5.0000 mg | Freq: Once | ORAL | Status: DC | PRN

## 2019-07-13 MED ORDER — BISACODYL 10 MG RE SUPP: 10.0000 mg | Freq: Every day | RECTAL | Status: DC | PRN

## 2019-07-13 MED ORDER — OXYCODONE HCL 5 MG PO TABS: 5.0000 mg | ORAL_TABLET | Freq: Once | ORAL | Status: DC | PRN

## 2019-07-13 MED ORDER — INSULIN ASPART 100 UNIT/ML ~~LOC~~ SOLN
4.0000 [IU] | Freq: Three times a day (TID) | SUBCUTANEOUS | Status: DC
  Administered 2019-07-14 – 2019-07-15 (×4): 4 [IU] via SUBCUTANEOUS

## 2019-07-13 MED ORDER — THROMBIN 5000 UNITS EX SOLR
OROMUCOSAL | Status: DC | PRN
  Administered 2019-07-13: 5 mL via TOPICAL

## 2019-07-13 MED ORDER — BUPIVACAINE HCL (PF) 0.5 % IJ SOLN
INTRAMUSCULAR | Status: DC | PRN
  Administered 2019-07-13: 5 mL

## 2019-07-13 MED ORDER — FENTANYL CITRATE (PF) 100 MCG/2ML IJ SOLN
25.0000 ug | INTRAMUSCULAR | Status: DC | PRN
  Administered 2019-07-13 (×2): 25 ug via INTRAVENOUS

## 2019-07-13 MED ORDER — METHOCARBAMOL 500 MG PO TABS
500.0000 mg | ORAL_TABLET | Freq: Four times a day (QID) | ORAL | Status: DC | PRN
  Administered 2019-07-13 – 2019-07-15 (×5): 500 mg via ORAL
  Filled 2019-07-13 (×5): qty 1

## 2019-07-13 MED ORDER — SUCCINYLCHOLINE CHLORIDE 200 MG/10ML IV SOSY
PREFILLED_SYRINGE | INTRAVENOUS | Status: DC | PRN
  Administered 2019-07-13: 180 mg via INTRAVENOUS

## 2019-07-13 MED ORDER — HYDROMORPHONE HCL 1 MG/ML IJ SOLN
0.5000 mg | INTRAMUSCULAR | Status: DC | PRN
  Administered 2019-07-13: 0.5 mg via INTRAVENOUS
  Filled 2019-07-13: qty 0.5

## 2019-07-13 MED ORDER — DEXAMETHASONE SODIUM PHOSPHATE 10 MG/ML IJ SOLN
INTRAMUSCULAR | Status: DC | PRN
  Administered 2019-07-13: 10 mg via INTRAVENOUS

## 2019-07-13 MED ORDER — CHLORHEXIDINE GLUCONATE 4 % EX LIQD: 60.0000 mL | Freq: Once | CUTANEOUS | Status: DC

## 2019-07-13 MED ORDER — CEFAZOLIN SODIUM-DEXTROSE 2-4 GM/100ML-% IV SOLN
2.0000 g | Freq: Three times a day (TID) | INTRAVENOUS | Status: AC
  Administered 2019-07-14 (×2): 2 g via INTRAVENOUS
  Filled 2019-07-13 (×2): qty 100

## 2019-07-13 MED ORDER — ALUM & MAG HYDROXIDE-SIMETH 200-200-20 MG/5ML PO SUSP: 30.0000 mL | Freq: Four times a day (QID) | ORAL | Status: DC | PRN

## 2019-07-13 MED ORDER — BUPIVACAINE HCL (PF) 0.5 % IJ SOLN
INTRAMUSCULAR | Status: AC
  Filled 2019-07-13: qty 30

## 2019-07-13 MED ORDER — LACTATED RINGERS IV SOLN: INTRAVENOUS | Status: DC | PRN

## 2019-07-13 MED ORDER — ACETAMINOPHEN 10 MG/ML IV SOLN
INTRAVENOUS | Status: AC
  Filled 2019-07-13: qty 100

## 2019-07-13 MED ORDER — DOCUSATE SODIUM 100 MG PO CAPS
100.0000 mg | ORAL_CAPSULE | Freq: Two times a day (BID) | ORAL | Status: DC
  Administered 2019-07-13 – 2019-07-14 (×3): 100 mg via ORAL
  Filled 2019-07-13 (×3): qty 1

## 2019-07-13 MED ORDER — ALBUMIN HUMAN 5 % IV SOLN: INTRAVENOUS | Status: DC | PRN

## 2019-07-13 MED ORDER — PANTOPRAZOLE SODIUM 40 MG IV SOLR: 40.0000 mg | Freq: Every day | INTRAVENOUS | Status: DC

## 2019-07-13 MED ORDER — THROMBIN 5000 UNITS EX SOLR
CUTANEOUS | Status: AC
  Filled 2019-07-13: qty 5000

## 2019-07-13 MED ORDER — SODIUM CHLORIDE 0.9 % IV SOLN
INTRAVENOUS | Status: DC | PRN
  Administered 2019-07-13: 40 mL

## 2019-07-13 MED ORDER — CHLORHEXIDINE GLUCONATE CLOTH 2 % EX PADS: 6.0000 | MEDICATED_PAD | Freq: Once | CUTANEOUS | Status: DC

## 2019-07-13 MED ORDER — FLEET ENEMA 7-19 GM/118ML RE ENEM: 1.0000 | ENEMA | Freq: Once | RECTAL | Status: DC | PRN

## 2019-07-13 MED ORDER — SODIUM CHLORIDE 0.9% FLUSH: 3.0000 mL | Freq: Two times a day (BID) | INTRAVENOUS | Status: DC

## 2019-07-13 MED ORDER — LIDOCAINE-EPINEPHRINE 1 %-1:100000 IJ SOLN
INTRAMUSCULAR | Status: DC | PRN
  Administered 2019-07-13: 5 mL

## 2019-07-13 MED ORDER — PHENOL 1.4 % MT LIQD: 1.0000 | OROMUCOSAL | Status: DC | PRN

## 2019-07-13 MED ORDER — BUPIVACAINE LIPOSOME 1.3 % IJ SUSP
20.0000 mL | Freq: Once | INTRAMUSCULAR | Status: DC
  Filled 2019-07-13: qty 20

## 2019-07-13 MED ORDER — ACETAMINOPHEN 160 MG/5ML PO SOLN: 1000.0000 mg | Freq: Once | ORAL | Status: DC | PRN

## 2019-07-13 MED ORDER — ACETAMINOPHEN 500 MG PO TABS: 1000.0000 mg | ORAL_TABLET | Freq: Once | ORAL | Status: DC | PRN

## 2019-07-13 MED ORDER — MENTHOL 3 MG MT LOZG: 1.0000 | LOZENGE | OROMUCOSAL | Status: DC | PRN

## 2019-07-13 MED ORDER — SUFENTANIL CITRATE 250 MCG/5ML IV SOLN
0.2500 ug/kg/h | INTRAVENOUS | Status: AC
  Administered 2019-07-13: 10:00:00 .2 ug/kg/h via INTRAVENOUS
  Filled 2019-07-13: qty 5

## 2019-07-13 MED ORDER — PANTOPRAZOLE SODIUM 40 MG PO TBEC
40.0000 mg | DELAYED_RELEASE_TABLET | Freq: Every day | ORAL | Status: DC
  Administered 2019-07-13 – 2019-07-14 (×2): 40 mg via ORAL
  Filled 2019-07-13 (×2): qty 1

## 2019-07-13 MED ORDER — 0.9 % SODIUM CHLORIDE (POUR BTL) OPTIME
TOPICAL | Status: DC | PRN
  Administered 2019-07-13: 1000 mL

## 2019-07-13 MED ORDER — SODIUM CHLORIDE 0.9% FLUSH: 3.0000 mL | INTRAVENOUS | Status: DC | PRN

## 2019-07-13 MED ORDER — SODIUM CHLORIDE 0.9 % IV SOLN
10.0000 ug/kg/min | INTRAVENOUS | Status: AC
  Administered 2019-07-13: 10:00:00 5 ug/kg/min via INTRAVENOUS
  Filled 2019-07-13 (×2): qty 2

## 2019-07-13 MED ORDER — MELOXICAM 7.5 MG PO TABS
15.0000 mg | ORAL_TABLET | Freq: Every day | ORAL | Status: DC
  Administered 2019-07-13 – 2019-07-14 (×2): 15 mg via ORAL
  Filled 2019-07-13 (×3): qty 2

## 2019-07-13 MED ORDER — FENTANYL CITRATE (PF) 100 MCG/2ML IJ SOLN
INTRAMUSCULAR | Status: AC
  Filled 2019-07-13: qty 2

## 2019-07-13 MED ORDER — ACETAMINOPHEN 650 MG RE SUPP: 650.0000 mg | RECTAL | Status: DC | PRN

## 2019-07-13 MED ORDER — ACETAMINOPHEN 325 MG PO TABS: 650.0000 mg | ORAL_TABLET | ORAL | Status: DC | PRN

## 2019-07-13 MED ORDER — CANAGLIFLOZIN 100 MG PO TABS
100.0000 mg | ORAL_TABLET | Freq: Every day | ORAL | Status: DC
  Administered 2019-07-14 – 2019-07-15 (×2): 100 mg via ORAL
  Filled 2019-07-13 (×2): qty 1

## 2019-07-13 MED ORDER — METFORMIN HCL 500 MG PO TABS
1000.0000 mg | ORAL_TABLET | Freq: Two times a day (BID) | ORAL | Status: DC
  Administered 2019-07-14 – 2019-07-15 (×3): 1000 mg via ORAL
  Filled 2019-07-13 (×3): qty 2

## 2019-07-13 MED ORDER — PROPOFOL 500 MG/50ML IV EMUL
INTRAVENOUS | Status: DC | PRN
  Administered 2019-07-13: 100 ug/kg/min via INTRAVENOUS

## 2019-07-13 MED ORDER — PROPOFOL 10 MG/ML IV BOLUS
INTRAVENOUS | Status: DC | PRN
  Administered 2019-07-13: 40 mg via INTRAVENOUS
  Administered 2019-07-13: 110 mg via INTRAVENOUS
  Administered 2019-07-13: 50 mg via INTRAVENOUS

## 2019-07-13 MED ORDER — CEFAZOLIN SODIUM-DEXTROSE 2-4 GM/100ML-% IV SOLN
2.0000 g | INTRAVENOUS | Status: AC
  Administered 2019-07-13 (×2): 2 g via INTRAVENOUS

## 2019-07-13 MED ORDER — ONDANSETRON HCL 4 MG/2ML IJ SOLN: 4.0000 mg | Freq: Four times a day (QID) | INTRAMUSCULAR | Status: DC | PRN

## 2019-07-13 MED ORDER — POLYETHYLENE GLYCOL 3350 17 G PO PACK
17.0000 g | PACK | Freq: Every day | ORAL | Status: DC | PRN
  Administered 2019-07-13: 17 g via ORAL
  Filled 2019-07-13: qty 1

## 2019-07-13 MED ORDER — CEFAZOLIN SODIUM 1 G IJ SOLR
INTRAMUSCULAR | Status: AC
  Filled 2019-07-13: qty 20

## 2019-07-13 MED ORDER — ACETAMINOPHEN 10 MG/ML IV SOLN
1000.0000 mg | Freq: Once | INTRAVENOUS | Status: DC | PRN
  Administered 2019-07-13: 1000 mg via INTRAVENOUS

## 2019-07-13 MED ORDER — GABAPENTIN 600 MG PO TABS
600.0000 mg | ORAL_TABLET | Freq: Two times a day (BID) | ORAL | Status: DC
  Administered 2019-07-13 – 2019-07-14 (×3): 600 mg via ORAL
  Filled 2019-07-13 (×3): qty 1

## 2019-07-13 MED ORDER — SUCCINYLCHOLINE CHLORIDE 200 MG/10ML IV SOSY
PREFILLED_SYRINGE | INTRAVENOUS | Status: AC
  Filled 2019-07-13: qty 10

## 2019-07-13 MED ORDER — PRAVASTATIN SODIUM 40 MG PO TABS
40.0000 mg | ORAL_TABLET | Freq: Every day | ORAL | Status: DC
  Administered 2019-07-14: 40 mg via ORAL
  Filled 2019-07-13: qty 1

## 2019-07-13 MED ORDER — OXYCODONE HCL 5 MG PO TABS: 5.0000 mg | ORAL_TABLET | ORAL | Status: DC | PRN

## 2019-07-13 MED ORDER — ONDANSETRON HCL 4 MG/2ML IJ SOLN
INTRAMUSCULAR | Status: DC | PRN
  Administered 2019-07-13: 4 mg via INTRAVENOUS

## 2019-07-13 MED ORDER — FENTANYL CITRATE (PF) 250 MCG/5ML IJ SOLN
INTRAMUSCULAR | Status: AC
  Filled 2019-07-13: qty 5

## 2019-07-13 MED ORDER — ONDANSETRON HCL 4 MG PO TABS
4.0000 mg | ORAL_TABLET | Freq: Four times a day (QID) | ORAL | Status: DC | PRN
  Administered 2019-07-14: 4 mg via ORAL
  Filled 2019-07-13: qty 1

## 2019-07-13 MED ORDER — PHENYLEPHRINE 40 MCG/ML (10ML) SYRINGE FOR IV PUSH (FOR BLOOD PRESSURE SUPPORT)
PREFILLED_SYRINGE | INTRAVENOUS | Status: AC
  Filled 2019-07-13: qty 10

## 2019-07-13 SURGICAL SUPPLY — 137 items
ADH SKN CLS APL DERMABOND .7 (GAUZE/BANDAGES/DRESSINGS) ×12
APL SKNCLS STERI-STRIP NONHPOA (GAUZE/BANDAGES/DRESSINGS) ×4
APPLIER CLIP 11 MED OPEN (CLIP) ×5
APR CLP MED 11 20 MLT OPN (CLIP) ×4
BASE TI BOLT 5.0X22.5 VARIABLE (Bolt) ×1 IMPLANT
BASKET BONE COLLECTION (BASKET) IMPLANT
BENZOIN TINCTURE PRP APPL 2/3 (GAUZE/BANDAGES/DRESSINGS) ×1 IMPLANT
BLADE CLIPPER SURG (BLADE) ×1 IMPLANT
BOLT BASE TI 5X20 VARIABLE (Bolt) ×2 IMPLANT
BUR BARREL STRAIGHT FLUTE 4.0 (BURR) IMPLANT
CANISTER SUCT 3000ML PPV (MISCELLANEOUS) ×5 IMPLANT
CARTRIDGE OIL MAESTRO DRILL (MISCELLANEOUS) ×8 IMPLANT
CLIP APPLIE 11 MED OPEN (CLIP) ×8 IMPLANT
CLIP NEUROVISION LG (CLIP) ×1 IMPLANT
CLIP VESOCCLUDE MED 24/CT (CLIP) ×4 IMPLANT
CLIP VESOCCLUDE SM WIDE 24/CT (CLIP) ×4 IMPLANT
CNTNR URN SCR LID CUP LEK RST (MISCELLANEOUS) ×4 IMPLANT
CONT SPEC 4OZ STRL OR WHT (MISCELLANEOUS) ×5
COUNTER NEEDLE 20 DBL MAG RED (NEEDLE) ×1 IMPLANT
COVER BACK TABLE 24X17X13 BIG (DRAPES) IMPLANT
COVER BACK TABLE 60X90IN (DRAPES) ×13 IMPLANT
COVER TRANSDUCER ULTRASND 9X24 (MISCELLANEOUS) ×2 IMPLANT
COVER WAND RF STERILE (DRAPES) ×16 IMPLANT
DECANTER SPIKE VIAL GLASS SM (MISCELLANEOUS) ×12 IMPLANT
DERMABOND ADVANCED (GAUZE/BANDAGES/DRESSINGS) ×3
DERMABOND ADVANCED .7 DNX12 (GAUZE/BANDAGES/DRESSINGS) ×16 IMPLANT
DIFFUSER DRILL AIR PNEUMATIC (MISCELLANEOUS) ×8 IMPLANT
DRAPE C-ARM 42X72 X-RAY (DRAPES) ×15 IMPLANT
DRAPE C-ARMOR (DRAPES) ×15 IMPLANT
DRAPE INCISE IOBAN 66X45 STRL (DRAPES) ×4 IMPLANT
DRAPE LAPAROTOMY 100X72X124 (DRAPES) ×15 IMPLANT
DRAPE SURG 17X23 STRL (DRAPES) ×5 IMPLANT
DRSG OPSITE POSTOP 3X4 (GAUZE/BANDAGES/DRESSINGS) ×2 IMPLANT
DRSG OPSITE POSTOP 4X6 (GAUZE/BANDAGES/DRESSINGS) ×1 IMPLANT
DRSG OPSITE POSTOP 4X8 (GAUZE/BANDAGES/DRESSINGS) ×1 IMPLANT
DURAPREP 26ML APPLICATOR (WOUND CARE) ×15 IMPLANT
ELECT BLADE 4.0 EZ CLEAN MEGAD (MISCELLANEOUS) ×10
ELECT REM PT RETURN 9FT ADLT (ELECTROSURGICAL) ×15
ELECTRODE BLDE 4.0 EZ CLN MEGD (MISCELLANEOUS) IMPLANT
ELECTRODE REM PT RTRN 9FT ADLT (ELECTROSURGICAL) ×12 IMPLANT
GAUZE 4X4 16PLY RFD (DISPOSABLE) ×2 IMPLANT
GAUZE SPONGE 4X4 12PLY STRL (GAUZE/BANDAGES/DRESSINGS) ×4 IMPLANT
GLOVE BIO SURGEON STRL SZ7.5 (GLOVE) ×1 IMPLANT
GLOVE BIO SURGEON STRL SZ8 (GLOVE) ×21 IMPLANT
GLOVE BIOGEL PI IND STRL 6.5 (GLOVE) IMPLANT
GLOVE BIOGEL PI IND STRL 7.5 (GLOVE) ×4 IMPLANT
GLOVE BIOGEL PI IND STRL 8 (GLOVE) ×16 IMPLANT
GLOVE BIOGEL PI IND STRL 8.5 (GLOVE) ×16 IMPLANT
GLOVE BIOGEL PI INDICATOR 6.5 (GLOVE) ×2
GLOVE BIOGEL PI INDICATOR 7.5 (GLOVE) ×2
GLOVE BIOGEL PI INDICATOR 8 (GLOVE) ×6
GLOVE BIOGEL PI INDICATOR 8.5 (GLOVE) ×3
GLOVE ECLIPSE 8.0 STRL XLNG CF (GLOVE) ×20 IMPLANT
GLOVE EXAM NITRILE XL STR (GLOVE) IMPLANT
GLOVE SURG SS PI 6.0 STRL IVOR (GLOVE) ×6 IMPLANT
GLOVE SURG SS PI 7.5 STRL IVOR (GLOVE) ×5 IMPLANT
GOWN STRL REUS W/ TWL LRG LVL3 (GOWN DISPOSABLE) ×8 IMPLANT
GOWN STRL REUS W/ TWL XL LVL3 (GOWN DISPOSABLE) ×20 IMPLANT
GOWN STRL REUS W/TWL 2XL LVL3 (GOWN DISPOSABLE) ×18 IMPLANT
GOWN STRL REUS W/TWL LRG LVL3 (GOWN DISPOSABLE) ×10
GOWN STRL REUS W/TWL XL LVL3 (GOWN DISPOSABLE) ×25
GUIDEWIRE NITINOL BEVEL TIP (WIRE) ×8 IMPLANT
HEMOSTAT POWDER SURGIFOAM 1G (HEMOSTASIS) ×5 IMPLANT
IMPL BASE TI 8X42X30MM-15 (Neuro Prosthesis/Implant) IMPLANT
IMPLANT BASE TI 8X42X30MM-15 (Neuro Prosthesis/Implant) ×5 IMPLANT
INSERT FOGARTY 61MM (MISCELLANEOUS) IMPLANT
INSERT FOGARTY SM (MISCELLANEOUS) IMPLANT
KIT BASIN OR (CUSTOM PROCEDURE TRAY) ×13 IMPLANT
KIT DILATOR XLIF 5 (KITS) IMPLANT
KIT INFUSE X SMALL 1.4CC (Orthopedic Implant) ×1 IMPLANT
KIT INFUSE XX SMALL 0.7CC (Orthopedic Implant) ×1 IMPLANT
KIT POSITION SURG JACKSON T1 (MISCELLANEOUS) ×5 IMPLANT
KIT SURGICAL ACCESS MAXCESS 4 (KITS) ×1 IMPLANT
KIT TURNOVER KIT B (KITS) ×17 IMPLANT
KIT XLIF (KITS) ×1
LOOP VESSEL MAXI BLUE (MISCELLANEOUS) ×4 IMPLANT
LOOP VESSEL MINI RED (MISCELLANEOUS) ×4 IMPLANT
Latex free elastic bands ×2 IMPLANT
MARKER SKIN DUAL TIP RULER LAB (MISCELLANEOUS) ×6 IMPLANT
MODULE NVM5 NEXT GEN EMG (NEEDLE) ×1 IMPLANT
MODULUS XLW 12X22X60MM 10 (Spine Construct) ×2 IMPLANT
NDL HYPO 25X1 1.5 SAFETY (NEEDLE) ×8 IMPLANT
NDL I PASS (NEEDLE) IMPLANT
NDL SPNL 18GX3.5 QUINCKE PK (NEEDLE) ×4 IMPLANT
NEEDLE HYPO 25X1 1.5 SAFETY (NEEDLE) ×15 IMPLANT
NEEDLE I PASS (NEEDLE) ×10 IMPLANT
NEEDLE SPNL 18GX3.5 QUINCKE PK (NEEDLE) ×5 IMPLANT
NS IRRIG 1000ML POUR BTL (IV SOLUTION) ×13 IMPLANT
OIL CARTRIDGE MAESTRO DRILL (MISCELLANEOUS)
PACK LAMINECTOMY NEURO (CUSTOM PROCEDURE TRAY) ×15 IMPLANT
PAD ARMBOARD 7.5X6 YLW CONV (MISCELLANEOUS) ×28 IMPLANT
PATTIES SURGICAL .5 X.5 (GAUZE/BANDAGES/DRESSINGS) IMPLANT
PATTIES SURGICAL .5 X1 (DISPOSABLE) IMPLANT
PATTIES SURGICAL 1X1 (DISPOSABLE) IMPLANT
PUTTY BONE ATTRAX 10CC STRIP (Putty) ×2 IMPLANT
ROD RELINE MAS LORD 5.5X110MM (Rod) ×1 IMPLANT
ROD RELINE MAS LORD 5.5X95MM (Rod) ×1 IMPLANT
SCREW LOCK RELINE 5.5 TULIP (Screw) ×8 IMPLANT
SCREW MAS RELINE 6.5X50 POLY (Screw) ×8 IMPLANT
SPONGE INTESTINAL PEANUT (DISPOSABLE) ×6 IMPLANT
SPONGE LAP 18X18 RF (DISPOSABLE) ×5 IMPLANT
SPONGE LAP 4X18 RFD (DISPOSABLE) IMPLANT
SPONGE SURGIFOAM ABS GEL 100 (HEMOSTASIS) IMPLANT
SPONGE SURGIFOAM ABS GEL SZ50 (HEMOSTASIS) ×4 IMPLANT
STAPLER SKIN PROX WIDE 3.9 (STAPLE) ×4 IMPLANT
STAPLER VISISTAT 35W (STAPLE) ×1 IMPLANT
STRIP CLOSURE SKIN 1/2X4 (GAUZE/BANDAGES/DRESSINGS) ×1 IMPLANT
SUT MNCRL AB 4-0 PS2 18 (SUTURE) ×4 IMPLANT
SUT PDS AB 1 CTX 36 (SUTURE) ×5 IMPLANT
SUT PROLENE 4 0 RB 1 (SUTURE)
SUT PROLENE 4-0 RB1 .5 CRCL 36 (SUTURE) ×16 IMPLANT
SUT PROLENE 5 0 CC1 (SUTURE) IMPLANT
SUT PROLENE 6 0 C 1 30 (SUTURE) ×4 IMPLANT
SUT PROLENE 6 0 CC (SUTURE) IMPLANT
SUT SILK 0 TIES 10X30 (SUTURE) ×4 IMPLANT
SUT SILK 2 0 TIES 10X30 (SUTURE) ×5 IMPLANT
SUT SILK 2 0 TIES 17X18 (SUTURE)
SUT SILK 2 0SH CR/8 30 (SUTURE) IMPLANT
SUT SILK 2-0 18XBRD TIE BLK (SUTURE) ×4 IMPLANT
SUT SILK 3 0 TIES 10X30 (SUTURE) ×4 IMPLANT
SUT SILK 3 0SH CR/8 30 (SUTURE) IMPLANT
SUT VIC AB 0 CT1 27 (SUTURE)
SUT VIC AB 0 CT1 27XBRD ANBCTR (SUTURE) ×4 IMPLANT
SUT VIC AB 1 CT1 18XBRD ANBCTR (SUTURE) ×12 IMPLANT
SUT VIC AB 1 CT1 8-18 (SUTURE) ×15
SUT VIC AB 2-0 CT1 18 (SUTURE) ×19 IMPLANT
SUT VIC AB 2-0 CT1 27 (SUTURE)
SUT VIC AB 2-0 CT1 TAPERPNT 27 (SUTURE) ×4 IMPLANT
SUT VIC AB 3-0 SH 27 (SUTURE)
SUT VIC AB 3-0 SH 27X BRD (SUTURE) ×4 IMPLANT
SUT VIC AB 3-0 SH 8-18 (SUTURE) ×21 IMPLANT
SUT VICRYL 4-0 PS2 18IN ABS (SUTURE) IMPLANT
SYR TB 1ML 25GX5/8 (SYRINGE) IMPLANT
TOWEL GREEN STERILE (TOWEL DISPOSABLE) ×13 IMPLANT
TOWEL GREEN STERILE FF (TOWEL DISPOSABLE) ×9 IMPLANT
TRAY FOLEY MTR SLVR 16FR STAT (SET/KITS/TRAYS/PACK) ×13 IMPLANT
WATER STERILE IRR 1000ML POUR (IV SOLUTION) ×13 IMPLANT

## 2019-07-13 NOTE — Progress Notes (Signed)
Awake, alert, conversant.  MAEW with good strength.  Still clearing ketamine, but answering questions appropriately.  Doing well.

## 2019-07-13 NOTE — Anesthesia Procedure Notes (Signed)
Arterial Line Insertion Start/End4/29/2021 7:00 AM, 07/13/2019 7:16 AM Performed by: Josephine Igo, CRNA, CRNA  Preanesthetic checklist: patient identified, IV checked, surgical consent, monitors and equipment checked, pre-op evaluation and timeout performed Lidocaine 1% used for infiltration Left, radial was placed Catheter size: 20 G Hand hygiene performed , maximum sterile barriers used  and Seldinger technique used  Attempts: 1 Procedure performed without using ultrasound guided technique. Following insertion, dressing applied and Biopatch. Post procedure assessment: normal  Patient tolerated the procedure well with no immediate complications.

## 2019-07-13 NOTE — Brief Op Note (Signed)
07/13/2019  4:49 PM  PATIENT:  Harry Zamora  79 y.o. male  PRE-OPERATIVE DIAGNOSIS:  Lumbar stenosis with neurogenic claudication, spondylolisthesis, herniated lumbar disc, lumbago, radiculopathy L 34, L 45, L 5 S 1 levels  POST-OPERATIVE DIAGNOSIS:  Lumbar stenosis with neurogenic claudication, spondylolisthesis, herniated lumbar disc, lumbago, radiculopathy L 34, L 45, L 5 S 1 levels  PROCEDURE:  Procedure(s) with comments: Lumbar Five-Sacral One Anterior lumbar interbody fusion (N/A) - Anterior approach Right Lumbar 3-4 Lumbar 4-5 Anterolateral lumbar interbody fusion (Right) Percutaneous pedicle screw fixation from Lumbar 3 to Sacral 1 (N/A) ABDOMINAL EXPOSURE (N/A) - anterior approach  SURGEON:  Surgeon(s) and Role: Panel 1:    Erline Levine, MD - Primary    * Vallarie Mare, MD - Assisting Panel 2:    * Serafina Mitchell, MD - Primary  PHYSICIAN ASSISTANT:   ASSISTANTS: Poteat, RN   ANESTHESIA:   general  EBL:  100 mL   BLOOD ADMINISTERED:none  DRAINS: none   LOCAL MEDICATIONS USED:  MARCAINE    and LIDOCAINE   SPECIMEN:  No Specimen  DISPOSITION OF SPECIMEN:  N/A  COUNTS:  YES  TOURNIQUET:  * No tourniquets in log *  DICTATION: DICTATION:   INDICATIONS:  Pateint is 79 year old male with chronic and intractable back and bilateral lower extremity pain,  who has previously undergone posterior decompression L 3 - S 1 levels.     It was elected to take him to surgery for anterior lumbar decompression and fusion at the L 34, L 45 levels with  ALIF L 5 S1 level with percutaneous pedicle screw fixation. The patient has lumbar disc herniations, recurrent stenosis and spondylolisthesis.  PROCEDURE:  Doctor Trula Slade performed exposure and his portion of the procedure will be dictated separately.  Upon exposing the L 5 S1 level, a localizing X ray was obtained with the C arm.  I then incised the anterior annulus and performed a thorough discectomy with wide  ligamentous releases.  The endplates were cleared of disc and cartilagenous material and a thorough discectomy was performed with decompression of the ventral annulus and disc material.  After trials, a 15 degree, 8 x 42 x 30 Base lordotic titanium ALIF cage was placed and lagged with 3, 5.0  mm screws, one in L 5 (22.5 mm) and two in S 1 (20 mm). This cage was packed with extra extra small BMP and Attrax which was reconstituted with marrow rich blood aspirated from the L 5 vertebral body.  The implant was tamped into position and positioning was confirmed with C arm.   Locking mechanisms were engaged, soft tissues were inspected and found to be in good repair.   Fascia was closed with 1 PDS running stitch, skin edges closed with 2-0 and 3-0 vicryl sutures.  Wound was dressed with a sterile occlusive dressing.    Patient was then  placed in a left lateral decubitus position on the operative table and using orthogonally projected C-arm fluoroscopy the patient was placed so that the L 45 levels were visualized in AP and lateral plane. The patient was then taped into position.  Skin was marked along with a posterior finger dissection incision. His flank was then prepped and draped in usual sterile fashion and incisions were made sequentially at L 45 level along the iliac crest. Finger dissection was made to enter the retroperitoneal space and then subsequently the probe was inserted into the psoas muscle from the right side initially at  the L45 level. After mapping the neural elements were able to dock the probe per the midpoint of this vertebral level and without indications electrically of too close proximity to the neural tissues. Subsequently the self-retaining tractor was.after sequential dilators were utilized the shim was employed and the interspace was cleared of psoas muscle and then incised. A thorough discectomy was performed. Instruments were used to clear the interspace of disc material. After thorough  discectomy was performed and this was performed using AP and lateral fluoroscopy a 12 lordotic by 60 x 22 mm implant was packed with extra small BMP and Attrax. This was tamped into position and its position was confirmed on AP and lateral fluoroscopy.  Finger dissection was made to enter the retroperitoneal space and then subsequently the probe was inserted into the psoas muscle from the right side initially at the L34 level through the same incision. After mapping the neural elements were able to dock the probe per the midpoint of this vertebral level and without indications electrically of too close proximity to the neural tissues. Subsequently the self-retaining tractor was.after sequential dilators were utilized the shim was employed and the interspace was cleared of psoas muscle and then incised. A thorough discectomy was performed. Instruments were used to clear the interspace of disc material. After thorough discectomy was performed and this was performed using AP and lateral fluoroscopy a 12 lordotic by 60 x 22 mm implant was packed with remaining BMP and Attrax. This was tamped into position and its position was confirmed on AP and lateral fluoroscopy.  Hemostasis was assured at each level.  Incisions were closed with 0, 2-0, 3-0 vicryl sutures and dressed with Dermabond and an occlusive dressing.  The patient was then turned into a prone position on the [place] table on [place] table and using AP and lateral fluoroscopy throughout this portion of the procedure, pedicle screws were placed using Nuvasive cannulated percutaneous screws. After placing guide wires at each level with the use of nerve monitoring throughout, Nuvasive Reline towers were docked on the L 3, L 4, L 5 S 1 levels.  Pedicle screws were placed bilaterally at  2 at L 3 6.5 x 50, 2 at L 4 and  L 5 (6.5 x 50) and S1 6.5 x 50 at each level). 110 mm rod on the left and 90 mm rod on the right were then affixed to the screw heads and locked  down on the screws. All connections were then torqued and the Towers were disassembled. The wounds were irrigated and then closed with 1, 2-0 and 3-0 Vicryl stitches. Long-acting Marcaine was infiltrated in subcutaneous tissues.  Sterile occlusive dressing was placed with Dermabond and occlusive dressings. The patient was then extubated in the operating room and taken to recovery in stable and satisfactory condition having tolerated his operation well. Counts were correct at the end of the case.   Patient was extubated in the OR and taken to recovery having tolerated her surgery well.  Counts were correct.    Retractor Times:  L 34 (23 mins); L 45 (20 mins).  PLAN OF CARE: Admit to inpatient   PATIENT DISPOSITION:  PACU - hemodynamically stable.   Delay start of Pharmacological VTE agent (>24hrs) due to surgical blood loss or risk of bleeding: yes

## 2019-07-13 NOTE — Op Note (Signed)
07/13/2019  4:49 PM  PATIENT:  Harry Zamora  79 y.o. male  PRE-OPERATIVE DIAGNOSIS:  Lumbar stenosis with neurogenic claudication, spondylolisthesis, herniated lumbar disc, lumbago, radiculopathy L 34, L 45, L 5 S 1 levels  POST-OPERATIVE DIAGNOSIS:  Lumbar stenosis with neurogenic claudication, spondylolisthesis, herniated lumbar disc, lumbago, radiculopathy L 34, L 45, L 5 S 1 levels  PROCEDURE:  Procedure(s) with comments: Lumbar Five-Sacral One Anterior lumbar interbody fusion (N/A) - Anterior approach Right Lumbar 3-4 Lumbar 4-5 Anterolateral lumbar interbody fusion (Right) Percutaneous pedicle screw fixation from Lumbar 3 to Sacral 1 (N/A) ABDOMINAL EXPOSURE (N/A) - anterior approach  SURGEON:  Surgeon(s) and Role: Panel 1:    Erline Levine, MD - Primary    * Vallarie Mare, MD - Assisting Panel 2:    * Serafina Mitchell, MD - Primary  PHYSICIAN ASSISTANT:   ASSISTANTS: Poteat, RN   ANESTHESIA:   general  EBL:  100 mL   BLOOD ADMINISTERED:none  DRAINS: none   LOCAL MEDICATIONS USED:  MARCAINE    and LIDOCAINE   SPECIMEN:  No Specimen  DISPOSITION OF SPECIMEN:  N/A  COUNTS:  YES  TOURNIQUET:  * No tourniquets in log *  DICTATION: DICTATION:   INDICATIONS:  Pateint is 79 year old male with chronic and intractable back and bilateral lower extremity pain,  who has previously undergone posterior decompression L 3 - S 1 levels.     It was elected to take him to surgery for anterior lumbar decompression and fusion at the L 34, L 45 levels with  ALIF L 5 S1 level with percutaneous pedicle screw fixation. The patient has lumbar disc herniations, recurrent stenosis and spondylolisthesis.  PROCEDURE:  Doctor Trula Slade performed exposure and his portion of the procedure will be dictated separately.  Upon exposing the L 5 S1 level, a localizing X ray was obtained with the C arm.  I then incised the anterior annulus and performed a thorough discectomy with wide  ligamentous releases.  The endplates were cleared of disc and cartilagenous material and a thorough discectomy was performed with decompression of the ventral annulus and disc material.  After trials, a 15 degree, 8 x 42 x 30 Base lordotic titanium ALIF cage was placed and lagged with 3, 5.0  mm screws, one in L 5 (22.5 mm) and two in S 1 (20 mm). This cage was packed with extra extra small BMP and Attrax which was reconstituted with marrow rich blood aspirated from the L 5 vertebral body.  The implant was tamped into position and positioning was confirmed with C arm.   Locking mechanisms were engaged, soft tissues were inspected and found to be in good repair.   Fascia was closed with 1 PDS running stitch, skin edges closed with 2-0 and 3-0 vicryl sutures.  Wound was dressed with a sterile occlusive dressing.    Patient was then  placed in a left lateral decubitus position on the operative table and using orthogonally projected C-arm fluoroscopy the patient was placed so that the L 45 levels were visualized in AP and lateral plane. The patient was then taped into position.  Skin was marked along with a posterior finger dissection incision. His flank was then prepped and draped in usual sterile fashion and incisions were made sequentially at L 45 level along the iliac crest. Finger dissection was made to enter the retroperitoneal space and then subsequently the probe was inserted into the psoas muscle from the right side initially at  the L45 level. After mapping the neural elements were able to dock the probe per the midpoint of this vertebral level and without indications electrically of too close proximity to the neural tissues. Subsequently the self-retaining tractor was.after sequential dilators were utilized the shim was employed and the interspace was cleared of psoas muscle and then incised. A thorough discectomy was performed. Instruments were used to clear the interspace of disc material. After thorough  discectomy was performed and this was performed using AP and lateral fluoroscopy a 12 lordotic by 60 x 22 mm implant was packed with extra small BMP and Attrax. This was tamped into position and its position was confirmed on AP and lateral fluoroscopy.  Finger dissection was made to enter the retroperitoneal space and then subsequently the probe was inserted into the psoas muscle from the right side initially at the L34 level through the same incision. After mapping the neural elements were able to dock the probe per the midpoint of this vertebral level and without indications electrically of too close proximity to the neural tissues. Subsequently the self-retaining tractor was.after sequential dilators were utilized the shim was employed and the interspace was cleared of psoas muscle and then incised. A thorough discectomy was performed. Instruments were used to clear the interspace of disc material. After thorough discectomy was performed and this was performed using AP and lateral fluoroscopy a 12 lordotic by 60 x 22 mm implant was packed with remaining BMP and Attrax. This was tamped into position and its position was confirmed on AP and lateral fluoroscopy.  Hemostasis was assured at each level.  Incisions were closed with 0, 2-0, 3-0 vicryl sutures and dressed with Dermabond and an occlusive dressing.  The patient was then turned into a prone position on the [place] table on [place] table and using AP and lateral fluoroscopy throughout this portion of the procedure, pedicle screws were placed using Nuvasive cannulated percutaneous screws. After placing guide wires at each level with the use of nerve monitoring throughout, Nuvasive Reline towers were docked on the L 3, L 4, L 5 S 1 levels.  Pedicle screws were placed bilaterally at  2 at L 3 6.5 x 50, 2 at L 4 and  L 5 (6.5 x 50) and S1 6.5 x 50 at each level). 110 mm rod on the left and 90 mm rod on the right were then affixed to the screw heads and locked  down on the screws. All connections were then torqued and the Towers were disassembled. The wounds were irrigated and then closed with 1, 2-0 and 3-0 Vicryl stitches. Long-acting Marcaine was infiltrated in subcutaneous tissues.  Sterile occlusive dressing was placed with Dermabond and occlusive dressings. The patient was then extubated in the operating room and taken to recovery in stable and satisfactory condition having tolerated his operation well. Counts were correct at the end of the case.   Patient was extubated in the OR and taken to recovery having tolerated her surgery well.  Counts were correct.    Retractor Times:  L 34 (23 mins); L 45 (20 mins).  PLAN OF CARE: Admit to inpatient   PATIENT DISPOSITION:  PACU - hemodynamically stable.   Delay start of Pharmacological VTE agent (>24hrs) due to surgical blood loss or risk of bleeding: yes

## 2019-07-13 NOTE — Op Note (Signed)
    Patient name: Harry Zamora MRN: [id number] DOB: Dec 27, 1940 Sex: male  07/13/2019 Pre-operative Diagnosis: Degenerative lower back disease Post-operative diagnosis:  Same Surgeon:  Annamarie Major  Co-surgeon: Dierdre Harness Assistants:  Verdis Prime Procedure:   Anterior exposure L5-S1 Anesthesia: General Blood Loss: Minimal Specimens: None  Findings: Normal anatomy  Indications: This is a 79 year old gentleman who presents today for back instrumentation.  Anterior exposure of the L5-S1 disc space was requested.  Risks and benefits of my portion of the procedure were discussed preoperatively with the patient at his clinic visit.  He wished to proceed.  Procedure:  The patient was identified in the holding area and taken to [facility]  The patient was then placed supine on the table. general anesthesia was administered.  The patient was prepped and draped in the usual sterile fashion.  A time out was called and antibiotics were administered.  Fluoroscopy was used to determine the appropriate level of skin incision.  A left lower quadrant incision was then made beginning in the midline and extending laterally to the edge of the rectus muscle.  Cautery was used about subcutaneous tissue down to the fascia which was exposed anteriorly.  Cautery was then used to divide the abdominal wall fascia.  Subfascial flaps were then raised with cautery.  The medial and lateral border of the rectus muscle was mobilized.  I then entered the retroperitoneal space lateral to the rectus muscle.  A plane was developed within the retroperitoneum and the iliac artery and vein were identified.  The external iliac artery was moderately calcified.  The retroperitoneal contents were then swept medially and superiorly until I encountered the ureter which was fully mobilized and protected laterally.  I then identified the spine.  The median sacral vessels were exposed and ligated between 2-0 silk ties.  Then using a Kitner,  the soft tissue was mobilized off of the disc space.  Next, the NuVasive retractor was set up.  I placed a 140 blade on either side of the spine protecting the iliac vein and artery.  A 160 blade was placed inferiorly and a 140 blade was placed superiorly.  A spinal needle was inserted into the L5-S1 disc space and fluoroscopy confirmed that we had adequate exposure at the appropriate level.  At this point, Dr. Vertell Limber began his portion of the procedure.  Please see his detailed operative note for the remaining portions of the procedure.   Disposition: No immediate complications   V. Annamarie Major, M.D., [facility] [place] Office: [phone] Pager:  [phone]

## 2019-07-13 NOTE — Anesthesia Procedure Notes (Signed)
Arterial Line Insertion Start/End4/29/2021 9:45 AM, 07/13/2019 9:55 AM Performed by: Lavell Luster, CRNA, CRNA  Preanesthetic checklist: patient identified, IV checked, risks and benefits discussed, surgical consent, monitors and equipment checked, pre-op evaluation and timeout performed Right, radial was placed Catheter size: 20 G Hand hygiene performed , maximum sterile barriers used  and Seldinger technique used Allen's test indicative of satisfactory collateral circulation Attempts: 2 Procedure performed without using ultrasound guided technique. Following insertion, Biopatch and dressing applied. Post procedure assessment: normal  Patient tolerated the procedure well with no immediate complications.

## 2019-07-13 NOTE — Transfer of Care (Signed)
Immediate Anesthesia Transfer of Care Note  Patient: Harry Zamora [facility]  Procedure(s) Performed: Lumbar Five-Sacral One Anterior lumbar interbody fusion (N/A Spine Lumbar) Right Lumbar 3-4 Lumbar 4-5 Anterolateral lumbar interbody fusion (Right ) Percutaneous pedicle screw fixation from Lumbar 3 to Sacral 1 (N/A ) ABDOMINAL EXPOSURE (N/A Abdomen)  Patient Location: PACU  Anesthesia Type:General  Level of Consciousness: drowsy  Airway & Oxygen Therapy: Patient Spontanous Breathing and Patient connected to face mask oxygen  Post-op Assessment: Report given to RN and Post -op Vital signs reviewed and stable  Post vital signs: Reviewed and stable  Last Vitals:  Vitals Value Taken Time  BP 161/79 07/13/19 1646  Temp    Pulse 82 07/13/19 1649  Resp 17 07/13/19 1649  SpO2 100 % 07/13/19 1649  Vitals shown include unvalidated device data.  Last Pain:  Vitals:   07/13/19 0622  TempSrc: Oral  PainSc:          Complications: No apparent anesthesia complications

## 2019-07-13 NOTE — Interval H&P Note (Signed)
History and Physical Interval Note:  07/13/2019 7:32 AM  Harry Zamora  has presented today for surgery, with the diagnosis of Lumbar stenosis with neurogenic claudication.  The various methods of treatment have been discussed with the patient and family. After consideration of risks, benefits and other options for treatment, the patient has consented to  Procedure(s) with comments: Lumbar 5-Sacral 1 Anterior lumbar interbody fusion (N/A) - Dr Harold Barban for approach Right Lumbar 3-4 Lumbar 4-5 Anterolateral lumbar interbody fusion (Right) Percutaneous pedicle screw fixation from Lumbar 3 to Sacral 1 (N/A) ABDOMINAL EXPOSURE (N/A) as a surgical intervention.  The patient's history has been reviewed, patient examined, no change in status, stable for surgery.  I have reviewed the patient's chart and labs.  Questions were answered to the patient's satisfaction.     Peggyann Shoals

## 2019-07-13 NOTE — Anesthesia Procedure Notes (Addendum)
Procedure Name: Intubation Date/Time: 07/13/2019 9:40 AM Performed by: Imagene Riches, CRNA Pre-anesthesia Checklist: Patient identified, Emergency Drugs available, Suction available and Patient being monitored Patient Re-evaluated:Patient Re-evaluated prior to induction Oxygen Delivery Method: Circle System Utilized Preoxygenation: Pre-oxygenation with 100% oxygen Induction Type: IV induction Ventilation: Mask ventilation without difficulty Laryngoscope Size: Miller and 2 Grade View: Grade I Tube type: Oral Tube size: 7.5 mm Number of attempts: 1 Airway Equipment and Method: Stylet and Oral airway Placement Confirmation: ETT inserted through vocal cords under direct vision,  positive ETCO2 and breath sounds checked- equal and bilateral Secured at: 22 cm Tube secured with: Tape Dental Injury: Teeth and Oropharynx as per pre-operative assessment

## 2019-07-14 LAB — POCT I-STAT 7, (LYTES, BLD GAS, ICA,H+H)
Acid-Base Excess: 2 mmol/L (ref 0.0–2.0)
Acid-Base Excess: 0 mmol/L (ref 0.0–2.0)
Bicarbonate: 27.3 mmol/L (ref 20.0–28.0)
Bicarbonate: 26.4 mmol/L (ref 20.0–28.0)
Calcium, Ion: 1.26 mmol/L (ref 1.15–1.40)
Calcium, Ion: 1.22 mmol/L (ref 1.15–1.40)
HCT: 40 % (ref 39.0–52.0)
HCT: 38 % — ABNORMAL LOW (ref 39.0–52.0)
Hemoglobin: 13.6 g/dL (ref 13.0–17.0)
Hemoglobin: 12.9 g/dL — ABNORMAL LOW (ref 13.0–17.0)
O2 Saturation: 100 %
O2 Saturation: 100 %
Patient temperature: 34.6
Potassium: 5.3 mmol/L — ABNORMAL HIGH (ref 3.5–5.1)
Potassium: 4.7 mmol/L (ref 3.5–5.1)
Sodium: 138 mmol/L (ref 135–145)
Sodium: 139 mmol/L (ref 135–145)
TCO2: 29 mmol/L (ref 22–32)
TCO2: 28 mmol/L (ref 22–32)
pCO2 arterial: 44.4 mmHg (ref 32.0–48.0)
pCO2 arterial: 43.4 mmHg (ref 32.0–48.0)
pH, Arterial: 7.397 (ref 7.350–7.450)
pH, Arterial: 7.381 (ref 7.350–7.450)
pO2, Arterial: 272 mmHg — ABNORMAL HIGH (ref 83.0–108.0)
pO2, Arterial: 276 mmHg — ABNORMAL HIGH (ref 83.0–108.0)

## 2019-07-14 LAB — POCT I-STAT, CHEM 8
BUN: 19 mg/dL (ref 8–23)
BUN: 19 mg/dL (ref 8–23)
Calcium, Ion: 1.26 mmol/L (ref 1.15–1.40)
Calcium, Ion: 1.25 mmol/L (ref 1.15–1.40)
Chloride: 104 mmol/L (ref 98–111)
Chloride: 105 mmol/L (ref 98–111)
Creatinine, Ser: 1 mg/dL (ref 0.61–1.24)
Creatinine, Ser: 1 mg/dL (ref 0.61–1.24)
Glucose, Bld: 140 mg/dL — ABNORMAL HIGH (ref 70–99)
Glucose, Bld: 191 mg/dL — ABNORMAL HIGH (ref 70–99)
HCT: 43 % (ref 39.0–52.0)
HCT: 39 % (ref 39.0–52.0)
Hemoglobin: 14.6 g/dL (ref 13.0–17.0)
Hemoglobin: 13.3 g/dL (ref 13.0–17.0)
Potassium: 5.2 mmol/L — ABNORMAL HIGH (ref 3.5–5.1)
Potassium: 4.7 mmol/L (ref 3.5–5.1)
Sodium: 138 mmol/L (ref 135–145)
Sodium: 139 mmol/L (ref 135–145)
TCO2: 28 mmol/L (ref 22–32)
TCO2: 27 mmol/L (ref 22–32)

## 2019-07-14 LAB — GLUCOSE, CAPILLARY
Glucose-Capillary: 160 mg/dL — ABNORMAL HIGH (ref 70–99)
Glucose-Capillary: 99 mg/dL (ref 70–99)
Glucose-Capillary: 142 mg/dL — ABNORMAL HIGH (ref 70–99)
Glucose-Capillary: 210 mg/dL — ABNORMAL HIGH (ref 70–99)

## 2019-07-14 MED ORDER — MAGNESIUM CITRATE PO SOLN
1.0000 | Freq: Once | ORAL | Status: AC
  Administered 2019-07-14: 10:00:00 1 via ORAL
  Filled 2019-07-14: qty 296

## 2019-07-14 MED ORDER — HYDROCODONE-ACETAMINOPHEN 7.5-325 MG PO TABS
1.0000 | ORAL_TABLET | ORAL | Status: DC | PRN
  Administered 2019-07-14: 1 via ORAL
  Administered 2019-07-14 (×2): 2 via ORAL
  Administered 2019-07-15: 09:00:00 1 via ORAL
  Filled 2019-07-14: qty 2
  Filled 2019-07-14 (×2): qty 1
  Filled 2019-07-14: qty 2

## 2019-07-14 MED ORDER — BISACODYL 5 MG PO TBEC
10.0000 mg | DELAYED_RELEASE_TABLET | Freq: Every day | ORAL | Status: DC | PRN
  Administered 2019-07-14: 10 mg via ORAL
  Filled 2019-07-14: qty 2

## 2019-07-14 NOTE — Evaluation (Signed)
Occupational Therapy Evaluation Patient Details Name: Harry Zamora MRN: [id number] DOB: Jan 21, 1941 Today's Date: 07/14/2019    History of Present Illness 79 y/o male s/p ALIF L5-S1. PMHx: prostate CA, HTN, DM   Clinical Impression   PTA pt living with wife, independent for BADL. At time of eval, pt demonstrating ability to complete transfers and functional mobility at mod I- independent level. Pt able to complete functional mobility without DME or LOB. Back handout provided and reviewed adls in detail. Pt educated on: clothing between brace, never sleep in brace, set an alarm at night for medication, avoid sitting for long periods of time, correct bed positioning for sleeping, correct sequence for bed mobility, avoiding lifting more than 5 pounds and never wash directly over incision. All education is complete and patient indicates understanding. Pt has shower seat, toilet riser, and RW at home as needed. No further OT needs identified- OT will sign off.     Follow Up Recommendations  No OT follow up    Equipment Recommendations  None recommended by OT    Recommendations for Other Services       Precautions / Restrictions Precautions Precautions: Back Precaution Booklet Issued: Yes (comment) Precaution Comments: reviewed in context of BADL Restrictions Weight Bearing Restrictions: No      Mobility Bed Mobility               General bed mobility comments: up in chair  Transfers Overall transfer level: Modified independent                    Balance Overall balance assessment: Mild deficits observed, not formally tested                                         ADL either performed or assessed with clinical judgement   ADL Overall ADL's : Modified independent                                       General ADL Comments: Pt demonstrates ability to complete BADL at mod I level. Pt demonstrated figure 4 for LB dressing, toilet  transfer, and functional mobility without external assist. Pt did need cues for precautions, was only able to recall 1/3. Pt does have shower seat, RW, and toilet riser at home as needed     Vision Baseline Vision/History: Wears glasses Wears Glasses: At all times Patient Visual Report: No change from baseline       Perception     Praxis      Pertinent Vitals/Pain Pain Assessment: No/denies pain     Hand Dominance     Extremity/Trunk Assessment Upper Extremity Assessment Upper Extremity Assessment: Overall WFL for tasks assessed   Lower Extremity Assessment Lower Extremity Assessment: Defer to PT evaluation       Communication Communication Communication: No difficulties   Cognition Arousal/Alertness: Awake/alert Behavior During Therapy: WFL for tasks assessed/performed Overall Cognitive Status: Within Functional Limits for tasks assessed                                     General Comments       Exercises     Shoulder Instructions      Home Living Family/patient  expects to be discharged to:: Private residence Living Arrangements: Spouse/significant other Available Help at Discharge: Family;Available 24 hours/day Type of Home: House Home Access: Stairs to enter [employer] of Steps: 1   Home Layout: One level     Bathroom Shower/Tub: Occupational psychologist: Standard     Home Equipment: Toilet riser;Shower seat;Walker - 2 wheels   Additional Comments: recently had walk in shower installed      Prior Functioning/Environment Level of Independence: Independent                 OT Problem List: Decreased knowledge of use of DME or AE;Decreased knowledge of precautions      OT Treatment/Interventions:      OT Goals(Current goals can be found in the care plan section) Acute Rehab OT Goals Patient Stated Goal: return to independence OT Goal Formulation: All assessment and education complete, DC therapy  OT  Frequency:     Barriers to D/C:            Co-evaluation              AM-PAC OT "6 Clicks" Daily Activity     Outcome Measure Help from another person eating meals?: None Help from another person taking care of personal grooming?: None Help from another person toileting, which includes using toliet, bedpan, or urinal?: None Help from another person bathing (including washing, rinsing, drying)?: None Help from another person to put on and taking off regular upper body clothing?: None Help from another person to put on and taking off regular lower body clothing?: None 6 Click Score: 24   End of Session Equipment Utilized During Treatment: Back brace Nurse Communication: Mobility status  Activity Tolerance: Patient tolerated treatment well Patient left: in chair;with call bell/phone within reach  OT Visit Diagnosis: Unsteadiness on feet (R26.81);Other abnormalities of gait and mobility (R26.89)                Time: [id number] OT Time Calculation (min): 9 min Charges:  OT General Charges $OT Visit: 1 Visit OT Evaluation $OT Eval Low Complexity: 1 Low  Zenovia Jarred, MSOT, OTR/L [facility] Office Number: [phone] Pager: [phone]  Zenovia Jarred 07/14/2019, 9:00 AM

## 2019-07-14 NOTE — Progress Notes (Signed)
  Progress Note    07/14/2019 8:26 AM 1 Day Post-Op  Subjective:  No complaints. States he has walked the hall 5 times. Ambulating in room to bathroom. Sitting up in chair in room   Vitals:   07/14/19 0517 07/14/19 0726  BP: 129/67 (!) 107/49  Pulse: 72 72  Resp: 16 18  Temp: 97.7 F (36.5 C) 97.7 F (36.5 C)  SpO2: 98% 100%   Physical Exam: General: well appearing, well nourished, not in any acute discomfort Lungs: non labored Incisions:  Abdominal midline incision incisions and  Lumbar incisions clean, dry and intact. No swelling, ecchymosis, erythema or tenderness Extremities: moving all extremities without weakness, motor and sensory intact Abdomen: soft non tender Neurologic: alert and oriented  CBC    Component Value Date/Time   WBC 5.9 07/10/2019 0950   RBC 4.50 07/10/2019 0950   HGB 13.3 07/13/2019 1544   HCT 39.0 07/13/2019 1544   PLT 191 07/10/2019 0950   MCV 97.3 07/10/2019 0950   MCH 31.1 07/10/2019 0950   MCHC 32.0 07/10/2019 0950   RDW 13.3 07/10/2019 0950    BMET    Component Value Date/Time   NA 139 07/13/2019 1544   K 4.7 07/13/2019 1544   CL 105 07/13/2019 1544   CO2 28 07/10/2019 0950   GLUCOSE 191 (H) 07/13/2019 1544   BUN 19 07/13/2019 1544   CREATININE 1.00 07/13/2019 1544   CALCIUM 9.7 07/10/2019 0950   GFRNONAA 58 (L) 07/10/2019 0950   GFRAA >60 07/10/2019 0950    INR No results found for: INR   Intake/Output Summary (Last 24 hours) at 07/14/2019 0826 Last data filed at 07/14/2019 0600 Gross per 24 hour  Intake 2175 ml  Output 2900 ml  Net -725 ml     Assessment/Plan:  79 y.o. male is s/p Anterior exposure L5-S1 1 Day Post-Op. Doing well post op. Ambulating in room when seen. Minimal pain. Incisions with dressings clean, dry and intact. Stable from vascular point of view   Karoline Caldwell, PA-C [facility] [phone] 07/14/2019 8:26 AM

## 2019-07-14 NOTE — Evaluation (Signed)
Physical Therapy Evaluation Patient Details Name: Harry Zamora MRN: [id number] DOB: 10-07-1940 Today's Date: 07/14/2019   History of Present Illness  Pt is a 79 y/o male s/p ALIF L5-S1. PMHx: prostate CA, HTN, DM  Clinical Impression  Pt presented sitting OOB in recliner chair, awake and willing to participate in therapy session. Prior to admission, pt reported that he was independent with all functional mobility and ADLs. Pt lives with his wife in a one level home with one step to enter. At the time of evaluation, pt overall moving very well at a mod I to supervision level with all functional mobility. He participated in stair training this session as well without difficulties. PT provided pt education re: back precautions, car transfers with demonstration and a generalized walking program for pt to initiate upon d/c home. No further acute PT needed at this time. PT signing off.     Follow Up Recommendations No PT follow up    Equipment Recommendations  None recommended by PT    Recommendations for Other Services       Precautions / Restrictions Precautions Precautions: Back Precaution Booklet Issued: Yes (comment) Precaution Comments: reviewed 3/3 back precautions with pt throughout Required Braces or Orthoses: Spinal Brace Spinal Brace: Lumbar corset;Applied in sitting position Restrictions Weight Bearing Restrictions: No      Mobility  Bed Mobility               General bed mobility comments: pt OOB in recliner chair upon arrival  Transfers Overall transfer level: Modified independent Equipment used: None                Ambulation/Gait Ambulation/Gait assistance: Supervision Gait Distance (Feet): 500 Feet Assistive device: None Gait Pattern/deviations: Step-through pattern;Decreased stride length;Drifts right/left Gait velocity: decreased   General Gait Details: pt with mild instability but no overt LOB or need for physical assistance  Stairs Stairs:  Yes Stairs assistance: Supervision Stair Management: One rail Left;Step to pattern;With walker Number of Stairs: 1 General stair comments: no instability or need for physical assistance  Wheelchair Mobility    Modified Rankin (Stroke Patients Only)       Balance Overall balance assessment: Mild deficits observed, not formally tested                                           Pertinent Vitals/Pain Pain Assessment: No/denies pain    Home Living Family/patient expects to be discharged to:: Private residence Living Arrangements: Spouse/significant other Available Help at Discharge: Family;Available 24 hours/day Type of Home: House Home Access: Stairs to enter   [employer] of Steps: 1 Home Layout: One level Home Equipment: Toilet riser;Shower seat;Walker - 2 wheels Additional Comments: recently had walk in shower installed    Prior Function Level of Independence: Independent               Hand Dominance        Extremity/Trunk Assessment   Upper Extremity Assessment Upper Extremity Assessment: Defer to OT evaluation;Overall WFL for tasks assessed    Lower Extremity Assessment Lower Extremity Assessment: Overall WFL for tasks assessed    Cervical / Trunk Assessment Cervical / Trunk Assessment: Other exceptions Cervical / Trunk Exceptions: s/p lumbar sx  Communication   Communication: No difficulties  Cognition Arousal/Alertness: Awake/alert Behavior During Therapy: WFL for tasks assessed/performed Overall Cognitive Status: Within Functional Limits for tasks assessed  General Comments      Exercises     Assessment/Plan    PT Assessment Patent does not need any further PT services  PT Problem List         PT Treatment Interventions      PT Goals (Current goals can be found in the Care Plan section)  Acute Rehab PT Goals Patient Stated Goal: return to  independence PT Goal Formulation: All assessment and education complete, DC therapy    Frequency     Barriers to discharge        Co-evaluation               AM-PAC PT "6 Clicks" Mobility  Outcome Measure Help needed turning from your back to your side while in a flat bed without using bedrails?: None Help needed moving from lying on your back to sitting on the side of a flat bed without using bedrails?: None Help needed moving to and from a bed to a chair (including a wheelchair)?: None Help needed standing up from a chair using your arms (e.g., wheelchair or bedside chair)?: None Help needed to walk in hospital room?: None Help needed climbing 3-5 steps with a railing? : None 6 Click Score: 24    End of Session Equipment Utilized During Treatment: Back brace Activity Tolerance: Patient tolerated treatment well Patient left: in chair;with call bell/phone within reach Nurse Communication: Mobility status PT Visit Diagnosis: Other abnormalities of gait and mobility (R26.89)    Time: [id number] PT Time Calculation (min) (ACUTE ONLY): 9 min   Charges:   PT Evaluation $PT Eval Low Complexity: 1 Low          Eduard Clos, PT, DPT  Acute Rehabilitation Services Pager [phone] Office [place] 07/14/2019, 10:05 AM

## 2019-07-14 NOTE — Progress Notes (Addendum)
Subjective: Patient reports "I don't hurt. I walked for them down the hall"   Objective: Vital signs in last 24 hours: Temp:  [97 F (36.1 C)-98.1 F (36.7 C)] 97.7 F (36.5 C) (04/30 0726) Pulse Rate:  [72-91] 72 (04/30 0726) Resp:  [12-20] 18 (04/30 0726) BP: (107-180)/(49-116) 107/49 (04/30 0726) SpO2:  [95 %-100 %] 100 % (04/30 0726) Arterial Line BP: (164-183)/(75-81) 164/75 (04/29 1715)  Intake/Output from previous day: 04/29 0701 - 04/30 0700 In: 2175 [P.O.:25; I.V.:1900; IV Piggyback:250] Out: 2900 [Urine:2800; Blood:100] Intake/Output this shift: No intake/output data recorded.  Alert, conversant, sitting in chair. Reports no pain at present, responding well to two Norco 7.5/325. Full strength BLE. Incisions left abdomen, right side, and lumbar region without erythema, swelling or drainage beneath honeycomb and Dermabond. Belly nontender and nondistended. Not yet passing gas. Tolerating liquids well.   Lab Results: Recent Labs    07/13/19 1540 07/13/19 1544  HGB 12.9* 13.3  HCT 38.0* 39.0   BMET Recent Labs    07/13/19 1319 07/13/19 1319 07/13/19 1540 07/13/19 1544  NA 138   < > 139 139  K 5.2*   < > 4.7 4.7  CL 104  --   --  105  GLUCOSE 140*  --   --  191*  BUN 19  --   --  19  CREATININE 1.00  --   --  1.00   < > = values in this interval not displayed.    Studies/Results: DG Lumbar Spine 2-3 Views  Result Date: 07/13/2019 CLINICAL DATA:  Lumbar fusion EXAM: LUMBAR SPINE - 2-3 VIEW; DG C-ARM 1-60 MIN COMPARISON:  05/09/2019 FLUOROSCOPY TIME:  Fluoroscopy Time:  4 minutes 31 seconds Radiation Exposure Index (if provided by the fluoroscopic device): Not available Number of Acquired Spot Images: 8 FINDINGS: Interbody fusion is noted at L3-4, L4-5 and L5-S1. Pedicle screws are noted at all 4 levels with posterior fixation. Numbering nomenclature is similar to that utilized on prior MRI. IMPRESSION: Lumbar fusion from L3-S1. Electronically Signed   By: Inez Catalina M.D.   On: 07/13/2019 17:27   DG C-Arm 1-60 Min  Result Date: 07/13/2019 CLINICAL DATA:  Lumbar fusion EXAM: LUMBAR SPINE - 2-3 VIEW; DG C-ARM 1-60 MIN COMPARISON:  05/09/2019 FLUOROSCOPY TIME:  Fluoroscopy Time:  4 minutes 31 seconds Radiation Exposure Index (if provided by the fluoroscopic device): Not available Number of Acquired Spot Images: 8 FINDINGS: Interbody fusion is noted at L3-4, L4-5 and L5-S1. Pedicle screws are noted at all 4 levels with posterior fixation. Numbering nomenclature is similar to that utilized on prior MRI. IMPRESSION: Lumbar fusion from L3-S1. Electronically Signed   By: Inez Catalina M.D.   On: 07/13/2019 17:27   DG OR LOCAL ABDOMEN  Result Date: 07/13/2019 CLINICAL DATA:  Evaluate for retained instruments and sponges. EXAM: OR LOCAL ABDOMEN COMPARISON:  07/13/2019 FINDINGS: Interval placement of interbody fusion devices at L3-4 and L4-5. Stable appearance of interbody fusion device at L5-S1. no retained foreign body identified. Visualized bowel gas pattern is nonobstructive. IMPRESSION: 1. Postoperative changes. 2. No retained foreign body. 3. Findings are called to the operating room at the time of interpretation. Electronically Signed   By: Nolon Nations M.D.   On: 07/13/2019 14:18   DG OR LOCAL ABDOMEN  Result Date: 07/13/2019 CLINICAL DATA:  Status post lumbar surgery. EXAM: OR LOCAL ABDOMEN COMPARISON:  None. FINDINGS: The bowel gas pattern is normal. Status post prostatic brachytherapy seed placement. Status post surgical anterior fusion  of L5-S1. No other radiopaque foreign body is noted. IMPRESSION: Status post prostatic brachytherapy seed placement and anterior fusion of L5-S1. No other radiopaque foreign body is noted. These results were called by telephone at the time of interpretation on 07/13/2019 at 11:43 am to provider Dory Day in OR 21, who verbally acknowledged these results. Electronically Signed   By: Marijo Conception M.D.   On: 07/13/2019 11:43     Assessment/Plan: improving  LOS: 1 day  Will work on bowels and continue mobilizing in brace today. Pt & wife verbalize understanding of d/c instructions. F/u appt has been made. Expect d/c to home in am.   Verdis Prime 07/14/2019, 10:12 AM  Patient is doing well.  Anticipate discharge in AM if doing well.

## 2019-07-15 LAB — GLUCOSE, CAPILLARY: Glucose-Capillary: 140 mg/dL — ABNORMAL HIGH (ref 70–99)

## 2019-07-15 MED ORDER — HYDROCODONE-ACETAMINOPHEN 7.5-325 MG PO TABS: 1.0000 | ORAL_TABLET | ORAL | 0 refills | Status: DC | PRN

## 2019-07-15 MED ORDER — METHOCARBAMOL 500 MG PO TABS: 500.0000 mg | ORAL_TABLET | Freq: Four times a day (QID) | ORAL | 1 refills | Status: DC | PRN

## 2019-07-15 MED ORDER — DOCUSATE SODIUM 100 MG PO CAPS: 100.0000 mg | ORAL_CAPSULE | Freq: Two times a day (BID) | ORAL | 0 refills | Status: DC

## 2019-07-15 NOTE — Plan of Care (Signed)
Patient alert and oriented, mae's well, voiding adequate amount of urine, swallowing without difficulty, c/o mild pain at time of discharge and medication given. Patient discharged home with family. Script and discharged instructions given to patient. Patient and family stated understanding of instructions given. Patient has an appointment with Dr. Vertell Limber

## 2019-07-15 NOTE — Discharge Summary (Signed)
Physician Discharge Summary  Patient ID: Harry Zamora MRN: [id number] DOB/AGE: 80-02-1941 80 y.o.  Admit date: 07/13/2019 Discharge date: 07/15/2019  Admission Diagnoses: Lumbar spondylolisthesis  Discharge Diagnoses: The same Active Problems:   Spondylolisthesis of lumbar region   Discharged Condition: good  Hospital Course: Dr. Vertell Limber performed an L3-4 and L4-5 anterior lateral arthrodesis with posterior percutaneous pedicle screws and rods from L3 to the sacrum on 07/12/2012.  The patient's postoperative course was unremarkable.  On postoperative day #2 the patient requested discharge to home.  He was given written and oral discharge instructions.  All his questions were answered.  Consults: PT, OT, care management Significant Diagnostic Studies: None Treatments: L3-4 and L4-5 anterior lateral interbody arthrodesis; posterior percutaneous pedicle screws and rods from L3 to the sacrum Discharge Exam: Blood pressure (!) 104/48, pulse 90, temperature 98.8 F (37.1 C), temperature source Oral, resp. rate 18, height 6\' 2"  (1.88 m), weight 80.3 kg, SpO2 100 %. The patient is alert and pleasant.  He is moving his lower extremities well.  Disposition: Home  Discharge Instructions    Call MD for:  difficulty breathing, headache or visual disturbances   Complete by: As directed    Call MD for:  extreme fatigue   Complete by: As directed    Call MD for:  hives   Complete by: As directed    Call MD for:  persistant dizziness or light-headedness   Complete by: As directed    Call MD for:  persistant nausea and vomiting   Complete by: As directed    Call MD for:  redness, tenderness, or signs of infection (pain, swelling, redness, odor or green/yellow discharge around incision site)   Complete by: As directed    Call MD for:  severe uncontrolled pain   Complete by: As directed    Call MD for:  temperature >100.4   Complete by: As directed    Diet - low sodium heart healthy   Complete  by: As directed    Discharge instructions   Complete by: As directed    Call [phone] for a followup appointment. Take a stool softener while you are using pain medications.   Driving Restrictions   Complete by: As directed    Do not drive for 2 weeks.   Increase activity slowly   Complete by: As directed    Lifting restrictions   Complete by: As directed    Do not lift more than 5 pounds. No excessive bending or twisting.   May shower / Bathe   Complete by: As directed    Remove the dressing for 3 days after surgery.  You may shower, but leave the incision alone.   Remove dressing in 48 hours   Complete by: As directed    Your stitches are under the scan and will dissolve by themselves. The Steri-Strips will fall off after you take a few showers. Do not rub back or pick at the wound, Leave the wound alone.     Allergies as of 07/15/2019      Reactions   Levaquin [levofloxacin In D5w] Rash   Niaspan [niacin Er] Rash, Other (See Comments)   Flushing reaction   Tramadol Rash      Medication List    STOP taking these medications   meloxicam 15 MG tablet Commonly known as: MOBIC     TAKE these medications   docusate sodium 100 MG capsule Commonly known as: COLACE Take 1 capsule (100 mg total) by mouth 2 (  two) times daily.   Farxiga 10 MG Tabs tablet Generic drug: dapagliflozin propanediol Take 10 mg by mouth daily.   gabapentin 600 MG tablet Commonly known as: NEURONTIN Take 600 mg by mouth 2 (two) times daily.   HYDROcodone-acetaminophen 7.5-325 MG tablet Commonly known as: NORCO Take 1-2 tablets by mouth every 4 (four) hours as needed for moderate pain.   lovastatin 40 MG tablet Commonly known as: MEVACOR Take 40 mg by mouth at bedtime.   metFORMIN 500 MG tablet Commonly known as: GLUCOPHAGE Take 1,000 mg by mouth 2 (two) times daily with a meal.   methocarbamol 500 MG tablet Commonly known as: ROBAXIN Take 1 tablet (500 mg total) by mouth every 6 (six)  hours as needed for muscle spasms.   rOPINIRole 4 MG tablet Commonly known as: REQUIP Take 4 mg by mouth at bedtime.   Trulicity 1.5 0000000 Sopn Generic drug: Dulaglutide Inject 1.5 mg into the skin once a week.        Signed: Ophelia Charter 07/15/2019, 7:54 AM

## 2019-07-15 NOTE — Anesthesia Postprocedure Evaluation (Signed)
Anesthesia Post Note  Patient: Harry Zamora [facility]  Procedure(s) Performed: Lumbar Five-Sacral One Anterior lumbar interbody fusion (N/A Spine Lumbar) Right Lumbar 3-4 Lumbar 4-5 Anterolateral lumbar interbody fusion (Right ) Percutaneous pedicle screw fixation from Lumbar 3 to Sacral 1 (N/A ) ABDOMINAL EXPOSURE (N/A Abdomen)     Patient location during evaluation: PACU Anesthesia Type: General Level of consciousness: awake and alert Pain management: pain level controlled Vital Signs Assessment: post-procedure vital signs reviewed and stable Respiratory status: spontaneous breathing, nonlabored ventilation, respiratory function stable and patient connected to nasal cannula oxygen Cardiovascular status: blood pressure returned to baseline and stable Postop Assessment: no apparent nausea or vomiting Anesthetic complications: no    Last Vitals:  Vitals:   07/15/19 0334 07/15/19 0716  BP: 124/63 (!) 104/48  Pulse: 84 90  Resp: 18 18  Temp: 37 C 37.1 C  SpO2: 96% 100%    Last Pain:  Vitals:   07/15/19 0716  TempSrc: Oral  PainSc:                  Edan Serratore

## 2019-07-15 NOTE — Discharge Instructions (Signed)
Wound Care  Remove dressing in 2 days Leave incision open to air. You may shower. Do not scrub directly on incision.  Do not put any creams, lotions, or ointments on incision. Remove glue in 2 weeks Activity Walk each and every day, increasing distance each day. No lifting greater than 5 lbs.  Avoid bending, arching, and twisting. No driving for 2 weeks; may ride as a passenger locally. If provided with back brace, wear when out of bed.  It is not necessary to wear in bed. Diet Resume your normal diet.  Return to Work Will be discussed at you follow up appointment. Call Your Doctor If Any of These Occur Redness, drainage, or swelling at the wound.  Temperature greater than 101 degrees. Severe pain not relieved by pain medication. Incision starts to come apart. Follow Up Appt Call today for appointment in 3-4 weeks [id number]) or for problems.  If you have any hardware placed in your spine, you will need an x-ray before your appointment.

## 2019-07-17 MED FILL — Heparin Sodium (Porcine) Inj 1000 Unit/ML: INTRAMUSCULAR | Qty: 30 | Status: AC

## 2019-07-17 MED FILL — Sodium Chloride IV Soln 0.9%: INTRAVENOUS | Qty: 1000 | Status: AC

## 2019-08-11 ENCOUNTER — Other Ambulatory Visit: Payer: Self-pay | Admitting: Endocrinology

## 2019-08-11 ENCOUNTER — Other Ambulatory Visit: Payer: Self-pay | Admitting: *Deleted

## 2019-08-11 DIAGNOSIS — M79605 Pain in left leg: Secondary | ICD-10-CM

## 2019-08-11 DIAGNOSIS — M79604 Pain in right leg: Secondary | ICD-10-CM

## 2019-08-11 DIAGNOSIS — G8918 Other acute postprocedural pain: Secondary | ICD-10-CM

## 2019-08-15 ENCOUNTER — Ambulatory Visit
Admission: RE | Admit: 2019-08-15 | Discharge: 2019-08-15 | Disposition: A | Discharge status: Home / Self Care | Payer: Medicare PPO | Source: Ambulatory Visit | Attending: Endocrinology | Admitting: Endocrinology

## 2019-08-15 DIAGNOSIS — G8918 Other acute postprocedural pain: Secondary | ICD-10-CM

## 2019-08-15 DIAGNOSIS — M79604 Pain in right leg: Secondary | ICD-10-CM

## 2019-08-15 IMAGING — CT CT ABD-PELV W/O CM
2 of 4 series · 13 of 46 positions shown, 15 images · non-contrast
Comparison: [DATE]

CLINICAL DATA: Postoperative abdominal pain. Bilateral leg pain.
History of prostate cancer with seed implants.

EXAM:
CT ABDOMEN AND PELVIS WITHOUT CONTRAST
TECHNIQUE: Multidetector CT imaging of the abdomen and pelvis was performed
following the standard protocol without IV contrast.

[Series 2: routine abdomen pelvis without 5.00 br40 s3 axial · axial · non-contrast · 0.63mm/px · z∈[+1238,+1638]mm · 10 of 96 slices shown, 12 images]
[im 8/96  soft-tissue]
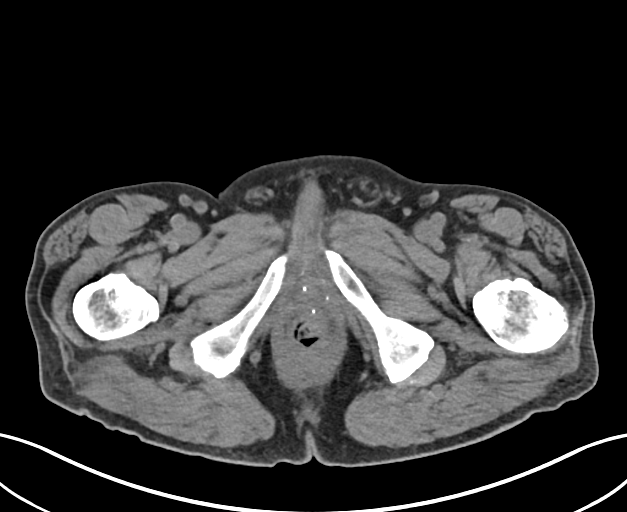
[im 8/96  bone]
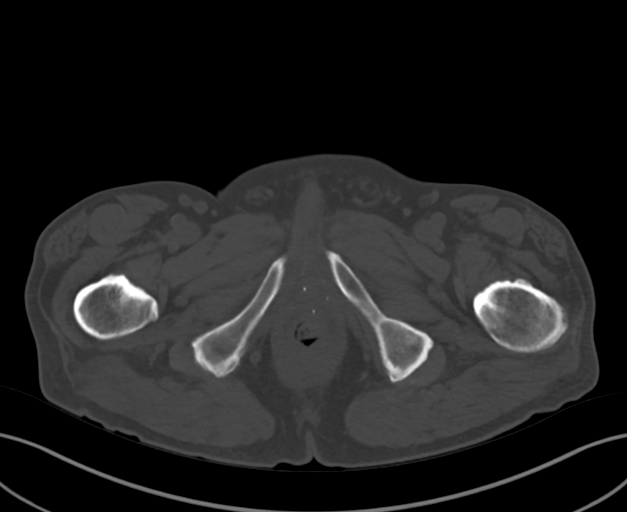
[im 16/96  soft-tissue]
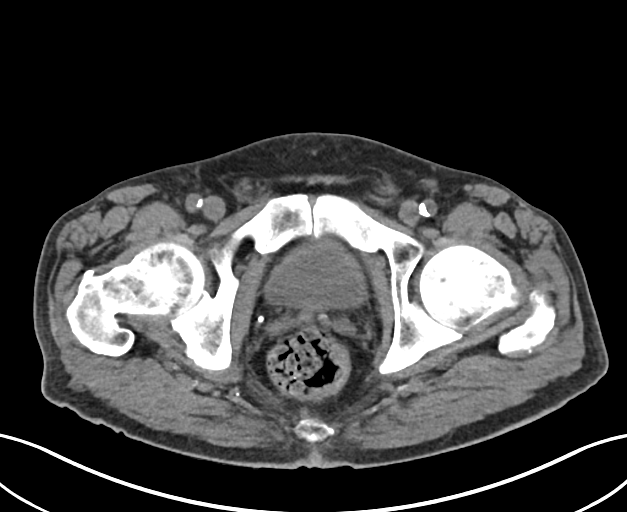
[im 24/96  soft-tissue]
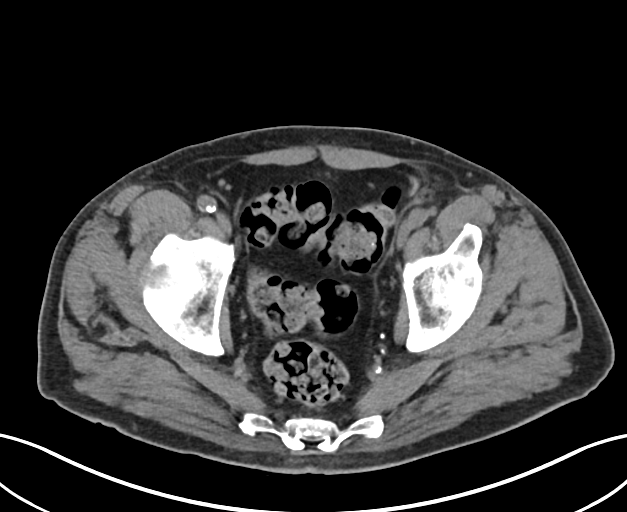
[im 36/96  soft-tissue]
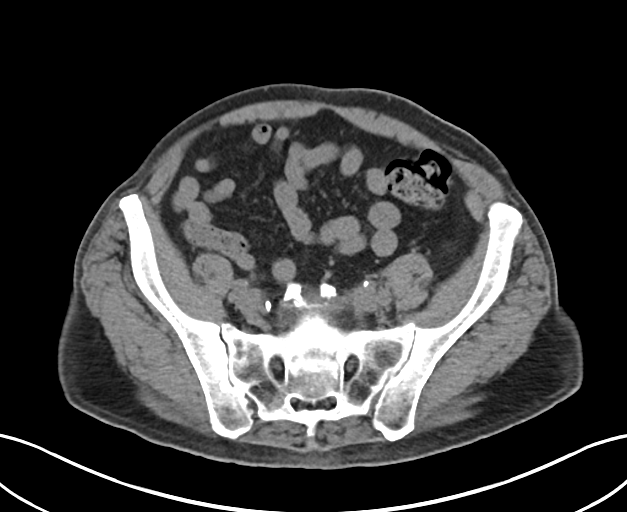
[im 44/96  soft-tissue]
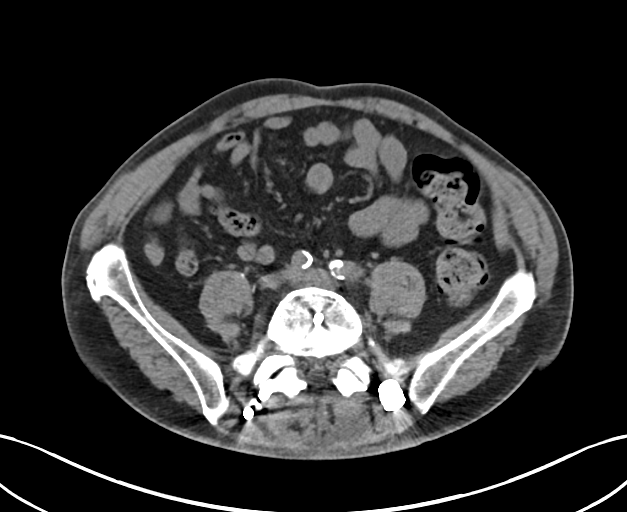
[im 52/96  soft-tissue]
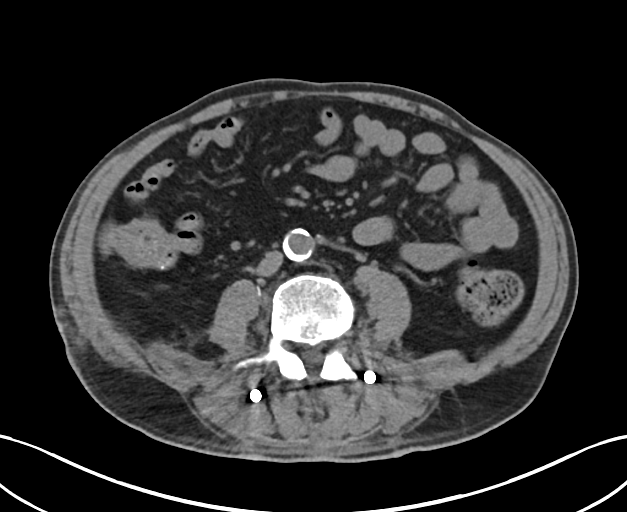
[im 60/96  soft-tissue]
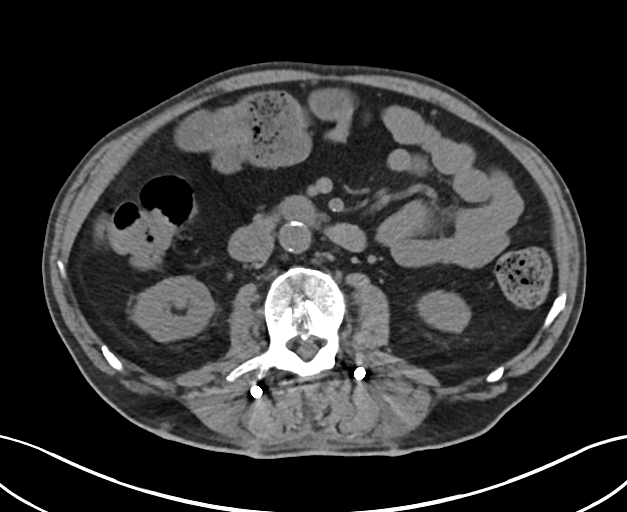
[im 72/96  soft-tissue]
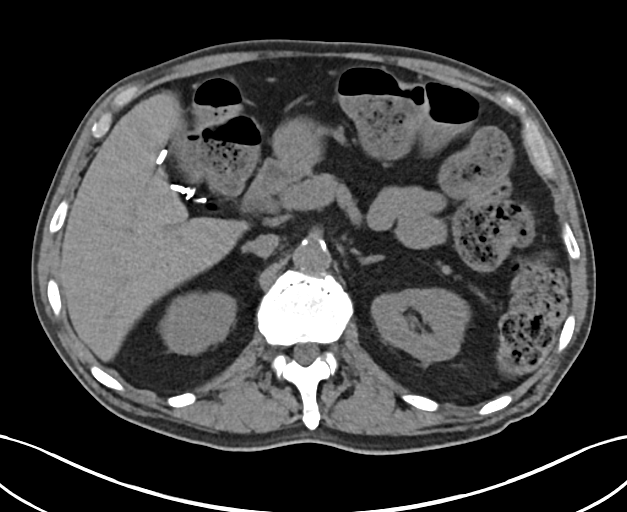
[im 80/96  soft-tissue]
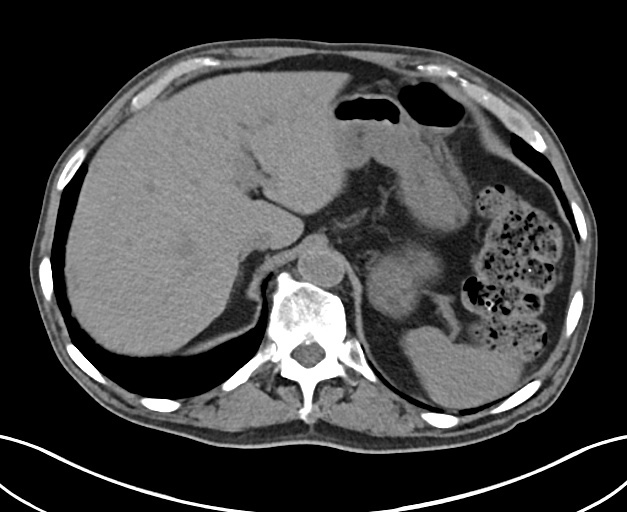
[im 80/96  bone]
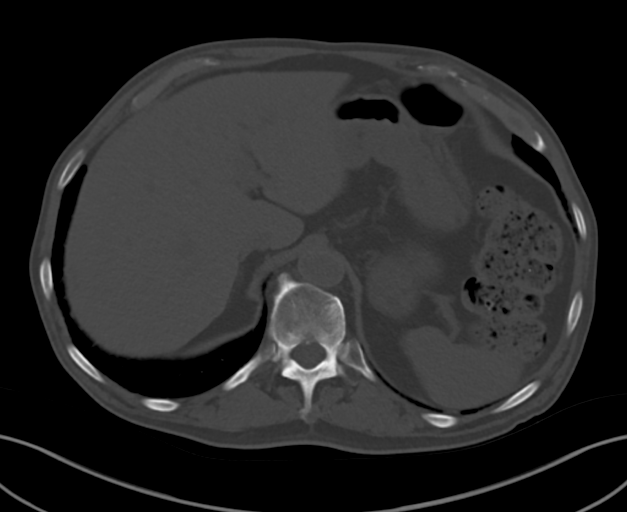
[im 88/96  soft-tissue]
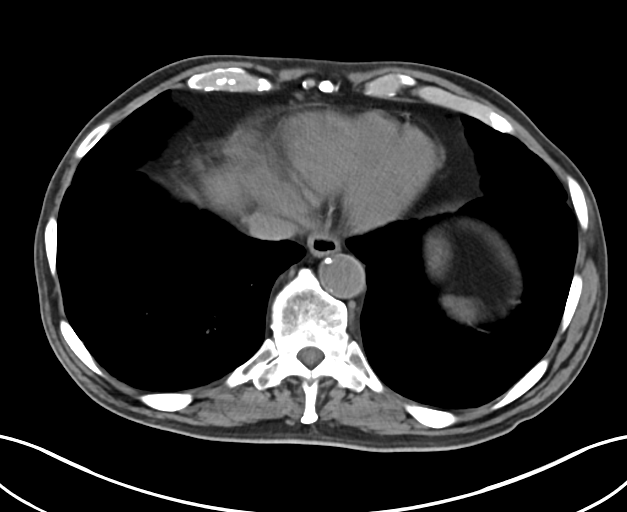

[Series 4: routine abdomen pelvis without 2.00 br40 s3 cor · coronal · non-contrast · 0.77mm/px · 3 of 158 slices shown]
[im 53/158  soft-tissue]
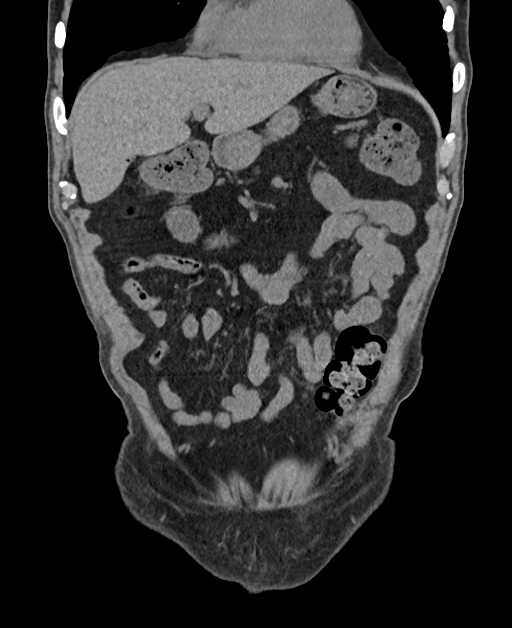
[im 70/158  soft-tissue]
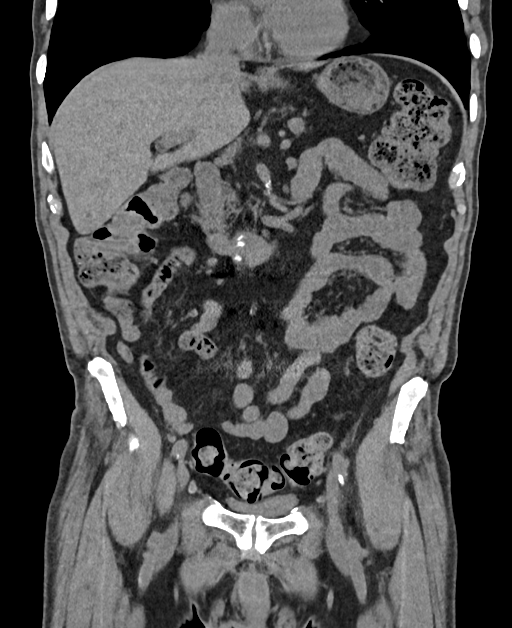
[im 88/158  soft-tissue]
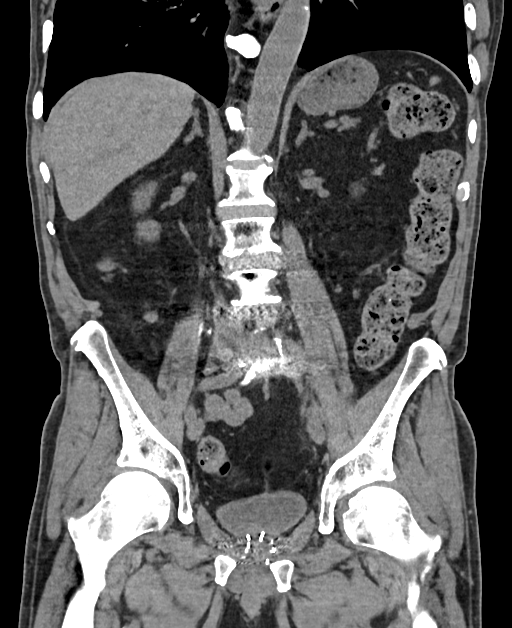

[13 of 46 positions shown; findings below may reference images not displayed]

FINDINGS: Lower chest: Negative.  Lung bases are clear.  No pleural fluid.

Hepatobiliary: Previous cholecystectomy. Liver parenchyma appears
normal without contrast.

Pancreas: Some atrophic change of the pancreas. No focal or acute
process.

Spleen: Normal

Adrenals/Urinary Tract: Adrenal glands are normal. Left kidney is
normal. Right kidney contains a nonobstructing 3 mm stone in the
midportion. Bladder is normal.

Stomach/Bowel: Fairly large amount of stool in the colon. No
abnormal small bowel finding. No evidence of diverticulitis or other
active bowel pathology.

Vascular/Lymphatic: Aortic atherosclerosis. No aneurysm. IVC is
normal. No retroperitoneal adenopathy.

Reproductive: Multiple seed implants throughout the prostate bed.

Other: No free fluid or air.

Musculoskeletal: Previous posterior decompression, diskectomy and
fusion procedure from L3 to the sacrum. Hardware components appear
well positioned without evidence of hardware complication. No
operative complication is evident. Patient has mild osteoarthritis
of the sacroiliac joints.
IMPRESSION: No acute organ pathology is evident. Previous cholecystectomy. 3 mm
nonobstructing stone in the midportion of the right kidney.

Aortic Atherosclerosis ([LV]-[LV]).

Multiple seed implants in the prostate bed. No evidence of local /
regional tumor.

Previous lumbar decompression and fusion surgery from L3 to the
sacrum. No evident complication visible on this noncontrast
abdominal CT.

## 2019-08-16 ENCOUNTER — Other Ambulatory Visit: Payer: Self-pay

## 2019-08-16 ENCOUNTER — Ambulatory Visit (INDEPENDENT_AMBULATORY_CARE_PROVIDER_SITE_OTHER): Payer: Medicare PPO | Admitting: Physician Assistant

## 2019-08-16 ENCOUNTER — Ambulatory Visit (HOSPITAL_COMMUNITY)
Admission: RE | Admit: 2019-08-16 | Discharge: 2019-08-16 | Disposition: A | Discharge status: Home / Self Care | Payer: Medicare PPO | Source: Ambulatory Visit | Attending: Vascular Surgery | Admitting: Vascular Surgery

## 2019-08-16 ENCOUNTER — Encounter: Payer: Self-pay | Admitting: Physician Assistant

## 2019-08-16 VITALS — BP 122/70 | HR 89 | Temp 97.0°F | Resp 18 | Ht 74.0 in | Wt 167.0 lb

## 2019-08-16 DIAGNOSIS — M79604 Pain in right leg: Secondary | ICD-10-CM | POA: Insufficient documentation

## 2019-08-16 DIAGNOSIS — M79605 Pain in left leg: Secondary | ICD-10-CM | POA: Insufficient documentation

## 2019-08-16 DIAGNOSIS — M479 Spondylosis, unspecified: Secondary | ICD-10-CM | POA: Diagnosis not present

## 2019-08-16 NOTE — Progress Notes (Signed)
HISTORY AND PHYSICAL     CC:  follow up. Requesting Provider:  [name], MD  HPI: This is a 79 y.o. male who is here today for evaluation of bilateral leg pain.  On his pre operative evaluation by Dr. Trula Slade, he was found to have atherosclerotic vascular disease on CT scan and did not have palpable pedal pulses on 07/03/2019.  He recently underwent anterior exposure of L5 S1 for fusion by Dr. Trula Slade and Dr. Vertell Limber on 07/13/2019. He had follow up with Dr. Vertell Limber earlier today. Per patient report Dr. Vertell Limber is concerned about herniated disc above level of recent lumbar surgery. He is scheduled to have MRI for further evaluation but the date of this has not yet been determined  The pt returns today for evaluation of bilateral leg pain. He states this began 1 week after he returned home from having his spine surgery. The pain is equal bilaterally. The pain radiates from the knee level down into the feet. He describes it as a constant burning aching pain with intermittent shooting pains. This is often worse at night. The discomfort is making it more difficult for him to ambulate and he has been using a cane since going home. This pain is different then the pain he was having pre operatively. He has tried gabapentin and was recently switched to lyrica 2 weeks ago with little improvement. He has hydrocodone which he uses in limited amount due to worsening constipation. He has tried tramadol and robaxin with little relief. He additionally has tried warm compresses, elevation, Voltaren gel, and massage with minimal improvement.  The pt is on a statin for cholesterol management.    The pt is not on an aspirin.    Other AC:  none The pt is not on meds for hypertension.  The pt does have diabetes. Tobacco hx: Former smoker, quit 2015  Pt does not have family hx of AAA.  Past Medical History:  Diagnosis Date  . Anemia    as an infant  . Arthritis   . Back pain   . Depression   . Disc  displacement, lumbar   . Gynecomastia   . Hyperlipidemia   . Hypertension    no longer on medications  . Hypertriglyceridemia   . Insomnia   . Low back pain   . Lumbar radiculopathy   . Lumbar stenosis   . OA (osteoarthritis)   . Polyneuropathy in diabetes(357.2)   . Prostate cancer ([place]) 2003  . Restless legs   . Ringing in ears   . RLS (restless legs syndrome)   . Type 2 diabetes mellitus ([place])     Past Surgical History:  Procedure Laterality Date  . ABDOMINAL EXPOSURE N/A 07/13/2019   Procedure: ABDOMINAL EXPOSURE;  Surgeon: Serafina Mitchell, MD;  Location: [facility] OR;  Service: Vascular;  Laterality: N/A;  anterior approach  . ANTERIOR LAT LUMBAR FUSION Right 07/13/2019   Procedure: Right Lumbar 3-4 Lumbar 4-5 Anterolateral lumbar interbody fusion;  Surgeon: Erline Levine, MD;  Location: [place];  Service: Neurosurgery;  Laterality: Right;  . ANTERIOR LUMBAR FUSION N/A 07/13/2019   Procedure: Lumbar Five-Sacral One Anterior lumbar interbody fusion;  Surgeon: Erline Levine, MD;  Location: [place];  Service: Neurosurgery;  Laterality: N/A;  Anterior approach  . COLONOSCOPY    . GALLBLADDER SURGERY    . [place]  . LUMBAR LAMINECTOMY/DECOMPRESSION MICRODISCECTOMY N/A 10/02/2016   Procedure: L3 to S1 Laminectomy;  Surgeon: Erline Levine, MD;  Location: [place];  Service: Neurosurgery;  Laterality: N/A;  L3 to S1 Laminectomy  . LUMBAR PERCUTANEOUS PEDICLE SCREW 3 LEVEL N/A 07/13/2019   Procedure: Percutaneous pedicle screw fixation from Lumbar 3 to Sacral 1;  Surgeon: Erline Levine, MD;  Location: [place];  Service: Neurosurgery;  Laterality: N/A;  . MENISCUS REPAIR Right   . PROSTATE SURGERY    . SHOULDER SURGERY Left 01/2011   rotator cuff    Allergies  Allergen Reactions  . Levaquin [Levofloxacin In D5w] Rash  . Niaspan [Niacin Er] Rash and Other (See Comments)    Flushing reaction  . Tramadol Rash    Current Outpatient Medications  Medication Sig Dispense Refill  .  docusate sodium (COLACE) 100 MG capsule Take 1 capsule (100 mg total) by mouth 2 (two) times daily. 60 capsule 0  . FARXIGA 10 MG TABS tablet Take 10 mg by mouth daily.     Marland Kitchen gabapentin (NEURONTIN) 600 MG tablet Take 600 mg by mouth 2 (two) times daily.   1  . HYDROcodone-acetaminophen (NORCO) 7.5-325 MG tablet Take 1-2 tablets by mouth every 4 (four) hours as needed for moderate pain. 30 tablet 0  . lovastatin (MEVACOR) 40 MG tablet Take 40 mg by mouth at bedtime.     . metFORMIN (GLUCOPHAGE) 500 MG tablet Take 1,000 mg by mouth 2 (two) times daily with a meal.   1  . methocarbamol (ROBAXIN) 500 MG tablet Take 1 tablet (500 mg total) by mouth every 6 (six) hours as needed for muscle spasms. 50 tablet 1  . rOPINIRole (REQUIP) 4 MG tablet Take 4 mg by mouth at bedtime.     . TRULICITY 1.5 0000000 SOPN Inject 1.5 mg into the skin once a week.     No current facility-administered medications for this visit.    Family History  Problem Relation Age of Onset  . Arthritis Mother   . Hypertension Mother   . Diabetes Mother   . Heart attack Mother   . Cancer Father     Social History   Socioeconomic History  . Marital status: Married    Spouse name: Leda Gauze  . Number of children: 3  . Years of education: 67  . Highest education level: Not on file  Occupational History  . Occupation: Banker: [employer]  Tobacco Use  . Smoking status: Light Tobacco Smoker    Types: Cigars  . Smokeless tobacco: Never Used  . Tobacco comment: occasional  Substance and Sexual Activity  . Alcohol use: Yes    Comment: social-beer  . Drug use: No  . Sexual activity: Not on file  Other Topics Concern  . Not on file  Social History Narrative   Patient is married Leda Gauze) and lives at home with his wife.   Patient has three children and two- step children.   Patient is working full-time.   Patient has a high school education.   Patient is right-handed.   Patient drinks two cups of  coffee, 2-3 sodas and one cup of tea daily.   Social Determinants of Health   Financial Resource Strain:   . Difficulty of Paying Living Expenses:   Food Insecurity:   . Worried About Charity fundraiser in the Last Year:   . Arboriculturist in the Last Year:   Transportation Needs:   . Film/video editor (Medical):   Marland Kitchen Lack of Transportation (Non-Medical):   Physical Activity:   . Days of Exercise per Week:   .  Minutes of Exercise per Session:   Stress:   . Feeling of Stress :   Social Connections:   . Frequency of Communication with Friends and Family:   . Frequency of Social Gatherings with Friends and Family:   . Attends Religious Services:   . Active Member of Clubs or Organizations:   . Attends Archivist Meetings:   Marland Kitchen Marital Status:   Intimate Partner Violence:   . Fear of Current or Ex-Partner:   . Emotionally Abused:   Marland Kitchen Physically Abused:   . Sexually Abused:      REVIEW OF SYSTEMS:   [X]  denotes positive finding, [ ]  denotes negative finding Cardiac  Comments:  Chest pain or chest pressure:    Shortness of breath upon exertion:    Short of breath when lying flat:    Irregular heart rhythm:        Vascular    Pain in calf, thigh, or hip brought on by ambulation:    Pain in feet at night that wakes you up from your sleep:     Blood clot in your veins:    Leg swelling:         Pulmonary    Oxygen at home:    Productive cough:     Wheezing:         Neurologic    Sudden weakness in arms or legs:     Sudden numbness in arms or legs:     Sudden onset of difficulty speaking or slurred speech:    Temporary loss of vision in one eye:     Problems with dizziness:         Gastrointestinal    Blood in stool:     Vomited blood:         Genitourinary    Burning when urinating:     Blood in urine:        Psychiatric    Major depression:         Hematologic    Bleeding problems:    Problems with blood clotting too easily:        Skin     Rashes or ulcers:        Constitutional    Fever or chills:      PHYSICAL EXAMINATION: Vitals:   08/16/19 1436  BP: 122/70  Pulse: 89  Resp: 18  Temp: (!) 97 F (36.1 C)  SpO2: 97%   General:  WDWN in NAD; vital signs documented above Gait: unsteady gait, ambulates with cane HENT: WNL, normocephalic Pulmonary: normal non-labored breathing , without wheezing Cardiac: regular HR, without  Murmur; without carotid bruit Abdomen: soft, NT, no masses Skin: without rashes Vascular Exam/Pulses: Doppler DP/PT/ [place] bilaterally. Left slightly more faint then right  Right Left  Radial 2+ (normal) 2+ (normal)  Ulnar 2+ (normal) 2+ (normal)  Femoral 2+ (normal) 2+ (normal)  Popliteal 2+ (normal) 2+ (normal)  DP Not palpable Not palpable   PT Not palpable Not palpable   Bilateral feet warm and well perfused Extremities: without ischemic changes, without Gangrene , without cellulitis; without open wounds; no edema Musculoskeletal: no muscle wasting or atrophy  Neurologic: A&O X 3;  No focal weakness or paresthesias are detected Psychiatric:  The pt has Normal affect.   Non-Invasive Vascular Imaging:   ABI's/TBI's on 08/16/19: Right:  0.82 - Great toe pressure: 84 Left:  0.64- Great toe pressure: 38   Previous ABI's/TBI's on 10/19/13: Right: 1.2 Left:  1.39  ASSESSMENT/PLAN:: 79 y.o. male here for follow up for bilateral lower extremity pain. Patients ABI/TBI are diminished mildly and he has non palpable pedal pulses, however Doppler signals in PT/DP/ pero bilaterally and bilateral feet warm. Patients pain seems more neuropathic in nature although he probably does have some underlying arterial disease - Patient asking about more pain medication but explained that there is not really any other medications to prescribe that will help further then what he has already tried taking. Opoid medications worsen his constipation and other pain medications he has not seen any improvement with.  I advised him to follow up with PCP regarding medications to help him relax and sleep better at night  - I will have him f/u in 1-3 weeks with bilateral arterial duplex - recommend he follow up with Dr. Vertell Limber for further evaluation and MRI  [facility] [phone]  Clinic MD: Dr. Oneida Alar

## 2019-08-21 ENCOUNTER — Other Ambulatory Visit: Payer: Self-pay | Admitting: *Deleted

## 2019-08-21 DIAGNOSIS — M79605 Pain in left leg: Secondary | ICD-10-CM

## 2019-08-21 DIAGNOSIS — M79604 Pain in right leg: Secondary | ICD-10-CM

## 2019-08-22 ENCOUNTER — Other Ambulatory Visit: Payer: Self-pay | Admitting: Neurosurgery

## 2019-08-22 DIAGNOSIS — M5126 Other intervertebral disc displacement, lumbar region: Secondary | ICD-10-CM

## 2019-08-23 ENCOUNTER — Other Ambulatory Visit (HOSPITAL_COMMUNITY): Payer: Self-pay | Admitting: Neurosurgery

## 2019-08-23 DIAGNOSIS — M5126 Other intervertebral disc displacement, lumbar region: Secondary | ICD-10-CM

## 2019-08-24 ENCOUNTER — Ambulatory Visit (HOSPITAL_COMMUNITY)
Admission: RE | Admit: 2019-08-24 | Discharge: 2019-08-24 | Disposition: A | Discharge status: Home / Self Care | Payer: Medicare PPO | Source: Ambulatory Visit | Attending: Neurosurgery | Admitting: Neurosurgery

## 2019-08-24 ENCOUNTER — Other Ambulatory Visit: Payer: Medicare PPO

## 2019-08-24 ENCOUNTER — Other Ambulatory Visit: Payer: Self-pay

## 2019-08-24 DIAGNOSIS — M5126 Other intervertebral disc displacement, lumbar region: Secondary | ICD-10-CM | POA: Insufficient documentation

## 2019-08-24 IMAGING — MR MR LUMBAR SPINE WO/W CM
8 of 9 series · 28 of 48 positions shown · IV contrast (gadavist)
Comparison: CT abdomen pelvis dated [DATE]. MRI lumbar spine
dated [DATE].

CLINICAL DATA: Low back pain radiating into both legs.

EXAM:
MRI LUMBAR SPINE WITHOUT AND WITH CONTRAST
TECHNIQUE: Multiplanar and multiecho pulse sequences of the lumbar spine were
obtained without and with intravenous contrast.
CONTRAST:  7mL GADAVIST GADOBUTROL 1 MMOL/ML IV SOLN

[Series 5: T1 · sagittal · 4.0mm · 0.81mm/px · 2 of 16 slices shown (1 of 2)]
[im 1/16]
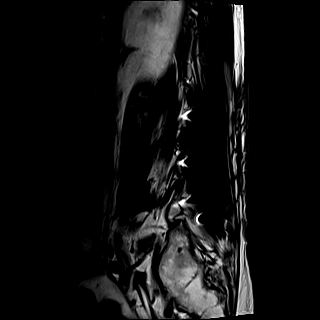
[im 16/16]
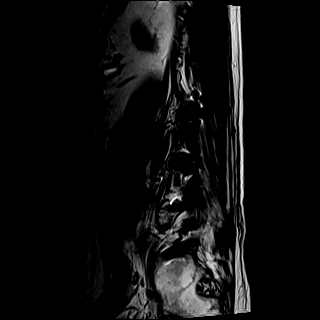

[Series 6: STIR · sagittal · 4.0mm · 0.51mm/px · 2 of 16 slices shown]
[im 1/16]
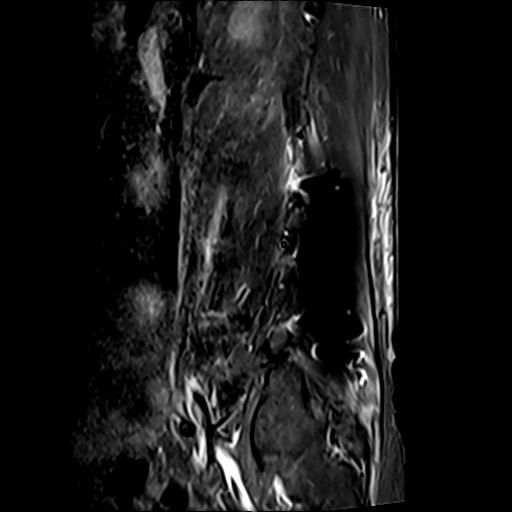
[im 16/16]
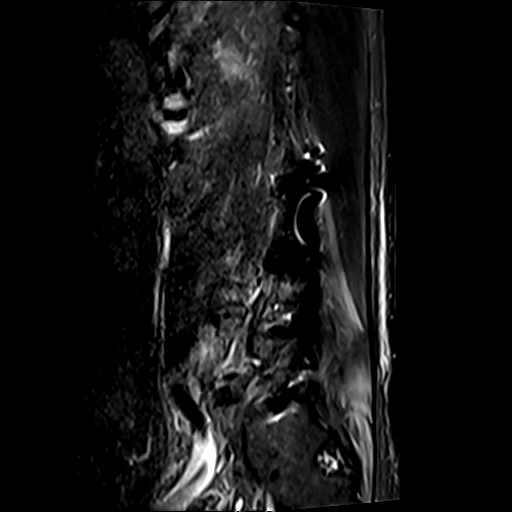

[Series 7: T2 · axial · 4.0mm · 0.78mm/px · z∈[-107,+118]mm · 3 of 22 slices shown (1 of 2)]
[im 1/22]
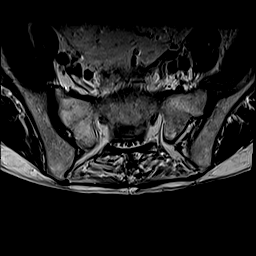
[im 11/22]
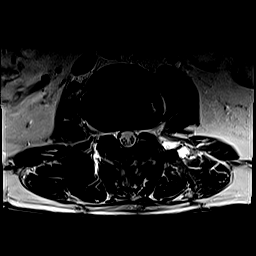
[im 22/22]
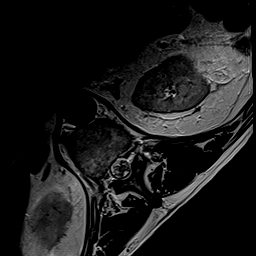

[Series 9: T2 · axial · 4.0mm · 0.78mm/px · z∈[-132,+94]mm · 6 of 43 slices shown (2 of 2)]
[im 1/43]
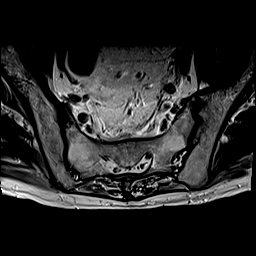
[im 9/43]
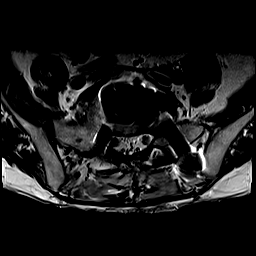
[im 17/43]
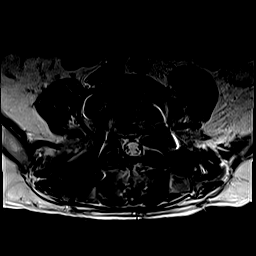
[im 26/43]
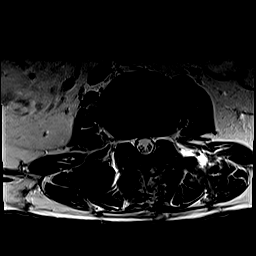
[im 34/43]
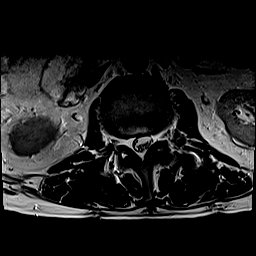
[im 43/43]
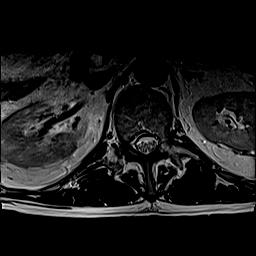

[Series 11: T1 · axial · 4.0mm · 0.43mm/px · z∈[-134,+93]mm · 6 of 43 slices shown (2 of 2)]
[im 1/43]
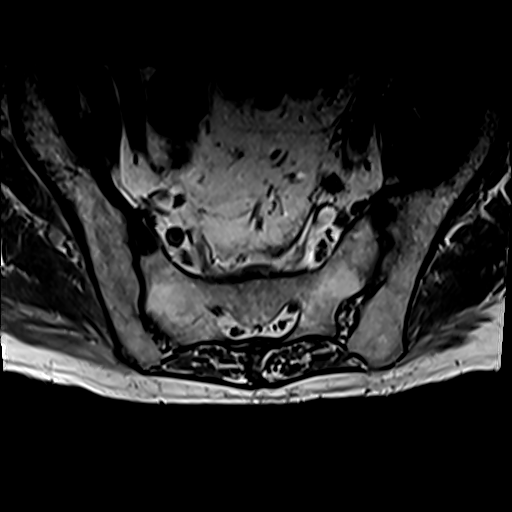
[im 9/43]
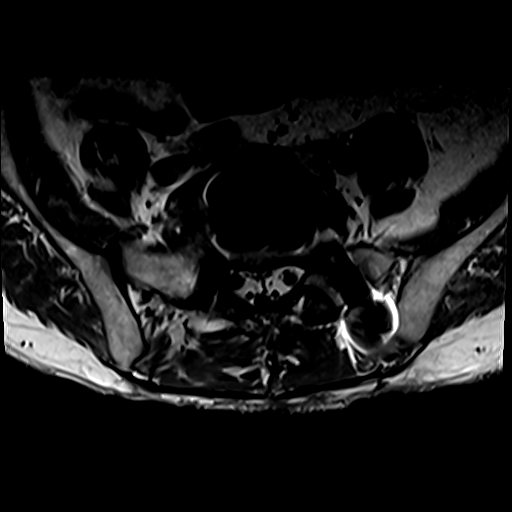
[im 17/43]
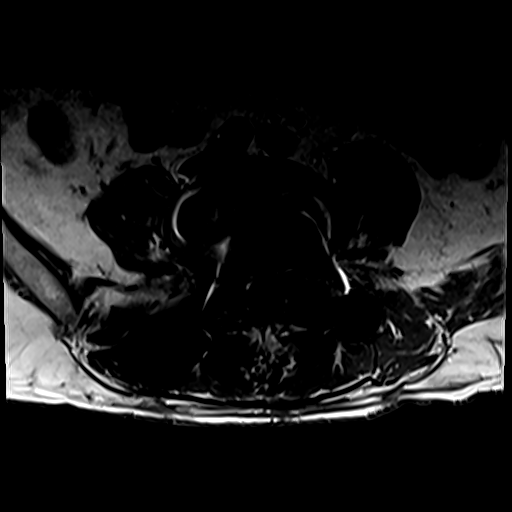
[im 26/43]
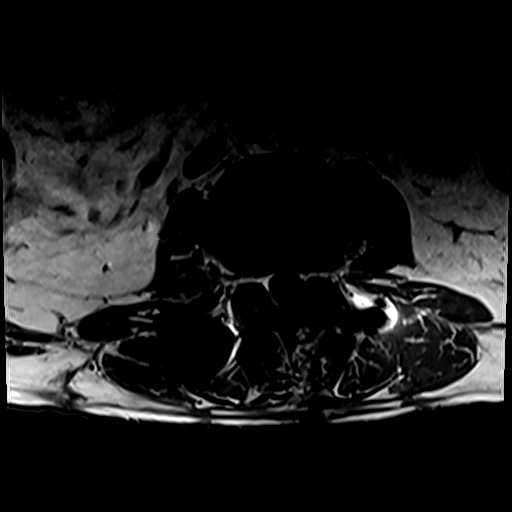
[im 34/43]
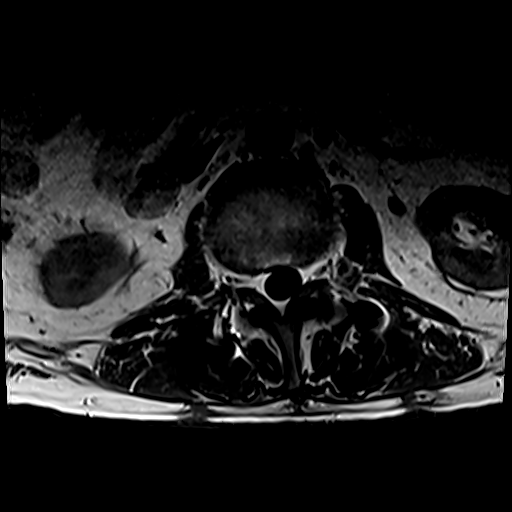
[im 43/43]
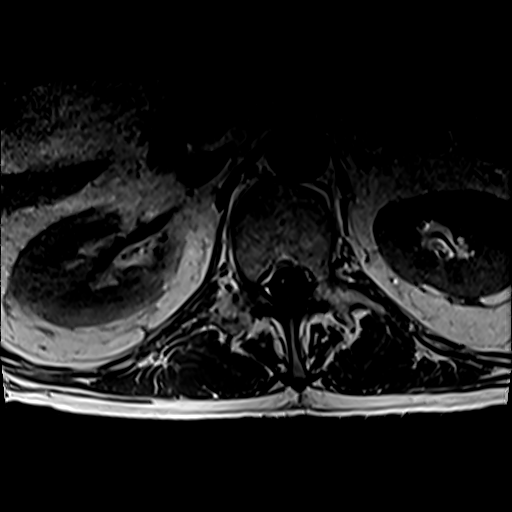

[Series 12: T2 post-contrast · sagittal · 4.0mm · 0.81mm/px · 2 of 16 slices shown]
[im 1/16]
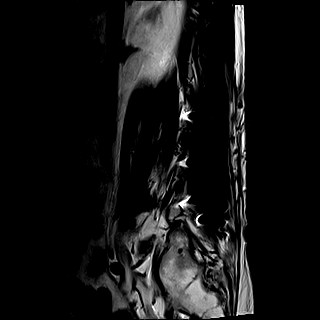
[im 16/16]
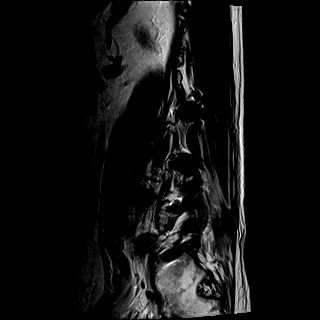

[Series 13: T1 fat-sat post-contrast · sagittal · 4.0mm · 0.81mm/px · 2 of 16 slices shown]
[im 1/16]
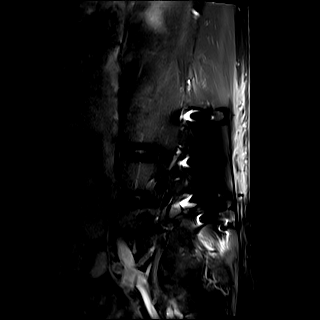
[im 16/16]
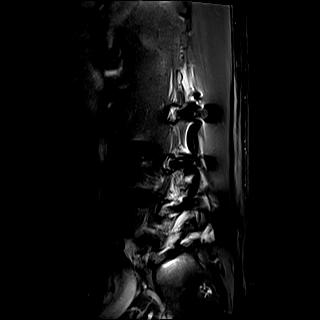

[Series 14: T1 post-contrast · axial · 4.0mm · 0.43mm/px · z∈[-134,+48]mm · 5 of 43 slices shown]
[im 1/43]
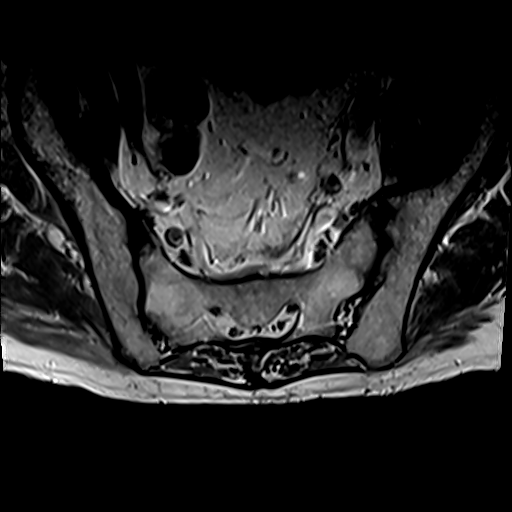
[im 9/43]
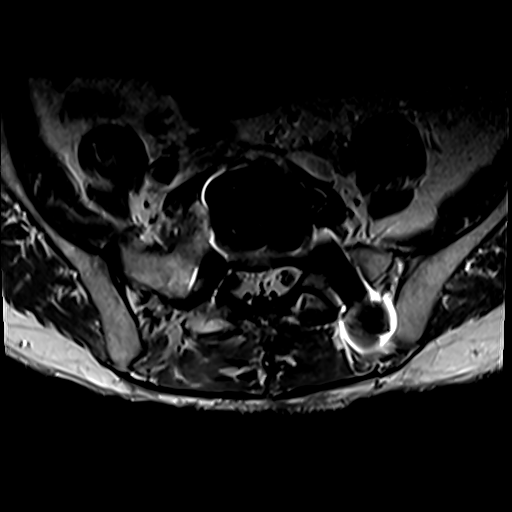
[im 17/43]
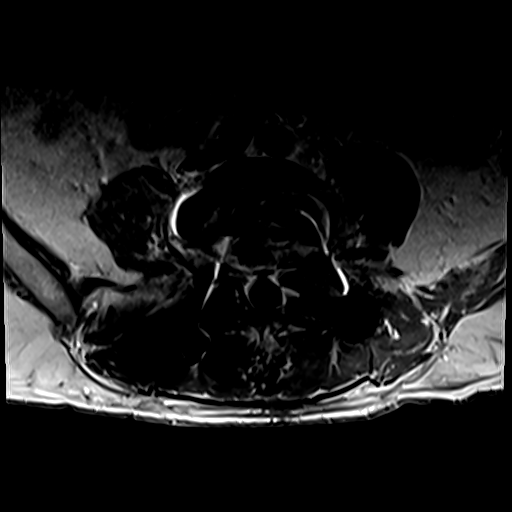
[im 26/43]
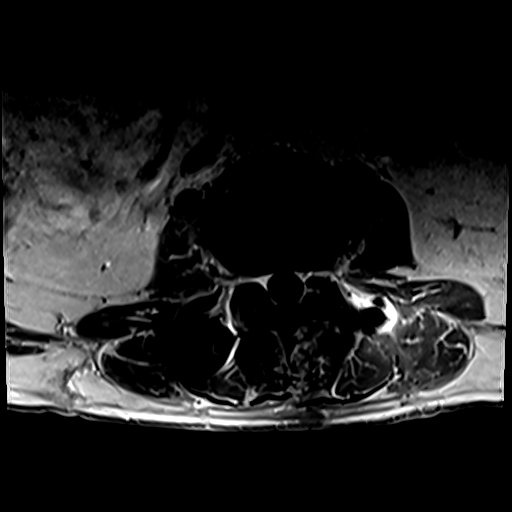
[im 34/43]
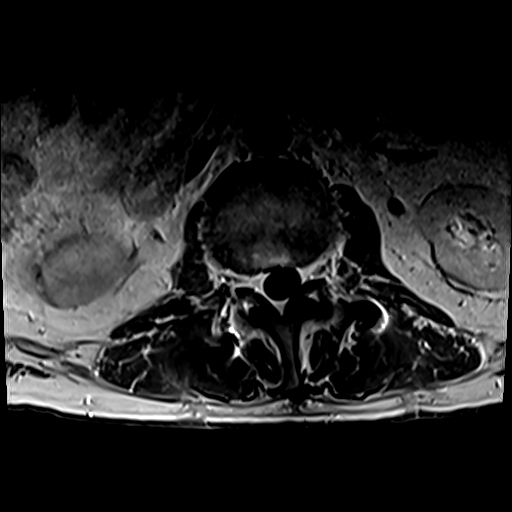

[28 of 48 positions shown; findings below may reference images not displayed]

FINDINGS: Segmentation:  Standard.

Alignment:  Unchanged trace retrolisthesis at L5-S1.

Vertebrae: Interval L3-S1 PLIF and L5-S1 ALIF. No acute fracture,
evidence of discitis, or suspicious bone lesion.

Conus medullaris and cauda equina: Conus extends to the T12-L1
level. Conus and cauda equina appear normal. No intradural
enhancement.

Paraspinal and other soft tissues: Negative.

Disc levels:

T12-L1:  No significant disc bulge or herniation.  No stenosis.

L1-L2: Unchanged small right foraminal disc protrusion and
borderline mild right neuroforaminal stenosis. No spinal canal or
left neuroforaminal stenosis.

L2-L3: Unchanged mild disc bulging and bilateral facet arthropathy.
No stenosis.

L3-L4: Interval PLIF. Prior posterior decompression. Residual mild
disc bulging. Unchanged mild bilateral facet arthropathy. Improved
lateral recess and neuroforaminal stenosis with residual mild left
neuroforaminal stenosis. No spinal canal stenosis.

L4-L5: Interval PLIF. Prior posterior decompression. Residual mild
disc bulging. Unchanged mild bilateral facet arthropathy. Residual
mild to moderate bilateral neuroforaminal stenosis, improved. No
spinal canal stenosis.

L5-S1: Interval ALIF and PLIF. Prior posterior decompression.
Improved small circumferential disc osteophyte complex. Unchanged
mild bilateral facet arthropathy. Minimally improved moderate to
severe right and moderate left neuroforaminal stenosis. No spinal
canal stenosis.
IMPRESSION: 1. Interval L3-S1 fusion. Minimally improved moderate to severe
right and moderate left neuroforaminal stenosis at L5-S1.
2. Improved lateral recess and neuroforaminal stenosis at L3-L4 and
L4-L5 with residual mild to moderate bilateral neuroforaminal
stenosis at L4-L5.

## 2019-08-24 MED ORDER — GADOBUTROL 1 MMOL/ML IV SOLN
7.5000 mL | Freq: Once | INTRAVENOUS | Status: AC | PRN
  Administered 2019-08-24: 7 mL via INTRAVENOUS

## 2019-08-30 ENCOUNTER — Ambulatory Visit: Payer: Medicare PPO | Admitting: Physician Assistant

## 2019-08-30 ENCOUNTER — Ambulatory Visit (HOSPITAL_COMMUNITY)
Admission: RE | Admit: 2019-08-30 | Discharge: 2019-08-30 | Disposition: A | Discharge status: Home / Self Care | Payer: Medicare PPO | Source: Ambulatory Visit | Attending: Vascular Surgery | Admitting: Vascular Surgery

## 2019-08-30 ENCOUNTER — Other Ambulatory Visit: Payer: Self-pay | Admitting: Neurosurgery

## 2019-08-30 ENCOUNTER — Other Ambulatory Visit: Payer: Self-pay

## 2019-08-30 VITALS — BP 115/65 | HR 88 | Temp 97.3°F | Resp 18 | Ht 74.0 in | Wt 162.0 lb

## 2019-08-30 DIAGNOSIS — M79605 Pain in left leg: Secondary | ICD-10-CM | POA: Insufficient documentation

## 2019-08-30 DIAGNOSIS — M48062 Spinal stenosis, lumbar region with neurogenic claudication: Secondary | ICD-10-CM

## 2019-08-30 DIAGNOSIS — I739 Peripheral vascular disease, unspecified: Secondary | ICD-10-CM | POA: Insufficient documentation

## 2019-08-30 DIAGNOSIS — M79604 Pain in right leg: Secondary | ICD-10-CM | POA: Diagnosis present

## 2019-08-30 NOTE — Progress Notes (Signed)
Established Intermittent Claudication   History of Present Illness   Harry Zamora is a 79 y.o. (1941/02/13) male who presents with chief complaint of persistent pain from the knee down of bilateral lower extremities.  He is status post anterior exposure by Dr. Trula Slade for L5-S1 fusion by Dr. Vertell Limber on 07/13/2019.  Patient states this pain has been persistent since discharge home after surgery.  He has tried gabapentin without relief.  He was switched to Lyrica recently however still without relief from symptoms.  Pain is not relieved with ambulation.  He denies nonhealing wounds of bilateral lower extremities or any claudication type pain in calfs with ambulation.  He returns today to go over bilateral lower extremity arterial duplex.  Past medical history also significant for type 2 diabetes mellitus.  The patient's PMH, PSH, SH, and FamHx were reviewed and are unchanged from prior visit.  Current Outpatient Medications  Medication Sig Dispense Refill  . docusate sodium (COLACE) 100 MG capsule Take 1 capsule (100 mg total) by mouth 2 (two) times daily. 60 capsule 0  . FARXIGA 10 MG TABS tablet Take 10 mg by mouth daily.     Marland Kitchen gabapentin (NEURONTIN) 600 MG tablet Take 600 mg by mouth 2 (two) times daily.   1  . HYDROcodone-acetaminophen (NORCO) 7.5-325 MG tablet Take 1-2 tablets by mouth every 4 (four) hours as needed for moderate pain. 30 tablet 0  . lovastatin (MEVACOR) 40 MG tablet Take 40 mg by mouth at bedtime.     . metFORMIN (GLUCOPHAGE) 500 MG tablet Take 1,000 mg by mouth 2 (two) times daily with a meal.   1  . methocarbamol (ROBAXIN) 500 MG tablet Take 1 tablet (500 mg total) by mouth every 6 (six) hours as needed for muscle spasms. 50 tablet 1  . rOPINIRole (REQUIP) 4 MG tablet Take 4 mg by mouth at bedtime.     . TRULICITY 1.5 GY/1.7CB SOPN Inject 1.5 mg into the skin once a week.     No current facility-administered medications for this visit.    REVIEW OF SYSTEMS (negative  unless checked):   Cardiac:  []  Chest pain or chest pressure? []  Shortness of breath upon activity? []  Shortness of breath when lying flat? []  Irregular heart rhythm?  Vascular:  []  Pain in calf, thigh, or hip brought on by walking? []  Pain in feet at night that wakes you up from your sleep? []  Blood clot in your veins? []  Leg swelling?  Pulmonary:  []  Oxygen at home? []  Productive cough? []  Wheezing?  Neurologic:  []  Sudden weakness in arms or legs? []  Sudden numbness in arms or legs? []  Sudden onset of difficult speaking or slurred speech? []  Temporary loss of vision in one eye? []  Problems with dizziness?  Gastrointestinal:  []  Blood in stool? []  Vomited blood?  Genitourinary:  []  Burning when urinating? []  Blood in urine?  Psychiatric:  []  Major depression  Hematologic:  []  Bleeding problems? []  Problems with blood clotting?  Dermatologic:  []  Rashes or ulcers?  Constitutional:  []  Fever or chills?  Ear/Nose/Throat:  []  Change in hearing? []  Nose bleeds? []  Sore throat?  Musculoskeletal:  []  Back pain? []  Joint pain? []  Muscle pain?   Physical Examination   Vitals:   08/30/19 1155  BP: 115/65  Pulse: 88  Resp: 18  Temp: (!) 97.3 F (36.3 C)  TempSrc: Temporal  SpO2: 100%  Weight: 162 lb (73.5 kg)  Height: 6\' 2"  (1.88 m)  Body mass index is 20.8 kg/m.  General:  WDWN in NAD; vital signs documented above Gait: Not observed HENT: WNL, normocephalic Pulmonary: normal non-labored breathing , without Rales, rhonchi,  wheezing Cardiac: regular HR Abdomen: soft, NT, no masses Skin: without rashes Vascular Exam/Pulses:  Right Left  DP absent absent  PT absent absent   Extremities: without ischemic changes, without Gangrene , without cellulitis; without open wounds;  Musculoskeletal: no muscle wasting or atrophy  Neurologic: A&O X 3;  No focal weakness or paresthesias are detected Psychiatric:  The pt has Normal  affect.  Non-Invasive Vascular imaging  Widely patent common femoral artery, profunda femoral artery, superficial femoral artery, popliteal artery bilaterally Diffuse tibial disease bilaterally however patent three-vessel runoff based on arterial duplex   Medical Decision Making   Harry Zamora is a 79 y.o. male who presents with persistent pain and weakness from the knee down bilaterally since L5-S1 fusion on 07/13/2019   Arterial duplex does not demonstrate any hemodynamically significant stenosis of bilateral lower extremities.  Patient does have diffuse tibial disease bilaterally however he does have three-vessel runoff bilaterally.  Diffuse tibial vessel disease alone would not explain pain at rest from the level of the knee down to the foot  No indication for revascularization based on symptom profile or arterial duplex performed today  Patient has a follow-up appointment to review recent MRI with Dr. Vertell Limber today  He may follow-up with [place] on an as-needed basis   Dagoberto Ligas PA-C [facility] Office: [phone]  Clinic MD: Scot Dock

## 2019-09-05 ENCOUNTER — Other Ambulatory Visit: Payer: Self-pay

## 2019-09-05 ENCOUNTER — Other Ambulatory Visit: Payer: Self-pay | Admitting: Neurosurgery

## 2019-09-05 ENCOUNTER — Other Ambulatory Visit (HOSPITAL_COMMUNITY)
Admission: RE | Admit: 2019-09-05 | Discharge: 2019-09-05 | Disposition: A | Discharge status: Home / Self Care | Payer: Medicare PPO | Source: Ambulatory Visit | Attending: Neurosurgery | Admitting: Neurosurgery

## 2019-09-05 ENCOUNTER — Encounter (HOSPITAL_COMMUNITY)
Admission: RE | Admit: 2019-09-05 | Discharge: 2019-09-05 | Disposition: A | Discharge status: Home / Self Care | Payer: Medicare PPO | Source: Ambulatory Visit | Attending: Neurosurgery | Admitting: Neurosurgery

## 2019-09-05 ENCOUNTER — Encounter (HOSPITAL_COMMUNITY): Payer: Self-pay

## 2019-09-05 DIAGNOSIS — Z20822 Contact with and (suspected) exposure to covid-19: Secondary | ICD-10-CM | POA: Insufficient documentation

## 2019-09-05 DIAGNOSIS — Z01812 Encounter for preprocedural laboratory examination: Secondary | ICD-10-CM | POA: Insufficient documentation

## 2019-09-05 LAB — CBC
HCT: 41.7 % (ref 39.0–52.0)
Hemoglobin: 12.9 g/dL — ABNORMAL LOW (ref 13.0–17.0)
MCH: 30.4 pg (ref 26.0–34.0)
MCHC: 30.9 g/dL (ref 30.0–36.0)
MCV: 98.1 fL (ref 80.0–100.0)
Platelets: 256 10*3/uL (ref 150–400)
RBC: 4.25 MIL/uL (ref 4.22–5.81)
RDW: 12.9 % (ref 11.5–15.5)
WBC: 5.6 10*3/uL (ref 4.0–10.5)
nRBC: 0 % (ref 0.0–0.2)

## 2019-09-05 LAB — BASIC METABOLIC PANEL
Anion gap: 10 (ref 5–15)
BUN: 34 mg/dL — ABNORMAL HIGH (ref 8–23)
CO2: 27 mmol/L (ref 22–32)
Calcium: 9.6 mg/dL (ref 8.9–10.3)
Chloride: 102 mmol/L (ref 98–111)
Creatinine, Ser: 1.38 mg/dL — ABNORMAL HIGH (ref 0.61–1.24)
GFR calc Af Amer: 56 mL/min — ABNORMAL LOW (ref >60)
GFR calc non Af Amer: 48 mL/min — ABNORMAL LOW (ref >60)
Glucose, Bld: 114 mg/dL — ABNORMAL HIGH (ref 70–99)
Potassium: 6.1 mmol/L — ABNORMAL HIGH (ref 3.5–5.1)
Sodium: 139 mmol/L (ref 135–145)

## 2019-09-05 LAB — GLUCOSE, CAPILLARY: Glucose-Capillary: 109 mg/dL — ABNORMAL HIGH (ref 70–99)

## 2019-09-05 LAB — SURGICAL PCR SCREEN
MRSA, PCR: NEGATIVE
Staphylococcus aureus: POSITIVE — AB

## 2019-09-05 LAB — SARS CORONAVIRUS 2 (TAT 6-24 HRS): SARS Coronavirus 2: NEGATIVE

## 2019-09-05 NOTE — Pre-Procedure Instructions (Signed)
[facility] [place], [place] AT [facility] OF [address] [place] [address] Phone: [phone] Fax: [phone]  [facility] for [place], [place] [address] Phone: [phone] Fax: [phone]      Your procedure is scheduled on June 25th.  Report to [facility] Main Entrance "A" at 7:00 A.M., and check in at the Admitting office.  Call this number if you have problems the morning of surgery:  [phone]  Call [phone] if you have any questions prior to your surgery date Monday-Friday 8am-4pm    Remember:  Do not eat or drink after midnight the night before your surgery     Take these medicines the morning of surgery with A SIP OF WATER  pregabalin (LYRICA) oxyCODONE-acetaminophen (PERCOCET)- if needed  As of today, STOP taking any Aspirin (unless otherwise instructed by your surgeon) and Aspirin containing products, Aleve, Naproxen, Ibuprofen, Motrin, Advil, Goody's, BC's, all herbal medications, fish oil, and all vitamins.   HOW TO MANAGE YOUR DIABETES BEFORE AND AFTER SURGERY  Why is it important to control my blood sugar before and after surgery? . Improving blood sugar levels before and after surgery helps healing and can limit problems. . A way of improving blood sugar control is eating a healthy diet by: o  Eating less sugar and carbohydrates o  Increasing activity/exercise o  Talking with your doctor about reaching your blood sugar goals . High blood sugars (greater than 180 mg/dL) can raise your risk of infections and slow your recovery, so you will need to focus on controlling your diabetes during the weeks before surgery. . Make sure that the doctor who takes care of your diabetes knows about your planned surgery including the date and location.  How do I manage my blood sugar before surgery? . Check your blood sugar at least 4  times a day, starting 2 days before surgery, to make sure that the level is not too high or low. o Check your blood sugar the morning of your surgery when you wake up and every 2 hours until you get to the Short Stay unit. . If your blood sugar is less than 70 mg/dL, you will need to treat for low blood sugar: o Do not take insulin. o Treat a low blood sugar (less than 70 mg/dL) with  cup of clear juice (cranberry or apple), 4 glucose tablets, OR glucose gel. Recheck blood sugar in 15 minutes after treatment (to make sure it is greater than 70 mg/dL). If your blood sugar is not greater than 70 mg/dL on recheck, call [phone] o  for further instructions. . Report your blood sugar to the short stay nurse when you get to Short Stay.  . If you are admitted to the hospital after surgery: o Your blood sugar will be checked by the staff and you will probably be given insulin after surgery (instead of oral diabetes medicines) to make sure you have good blood sugar levels. o The goal for blood sugar control after surgery is 80-180 mg/dL.     WHAT DO I DO ABOUT MY DIABETES MEDICATION?   Marland Kitchen Do not take oral diabetes medicines (pills):  FARXIGA 10 MG or metFORMIN (GLUCOPHAGE) the morning of surgery.   * Do NOT take FARXIGA 10 MG the day before surgery.  . The day of surgery, do not take other  diabetes injectables, including Byetta (exenatide), Bydureon (exenatide ER), Victoza (liraglutide), or Trulicity (dulaglutide).                      Do not wear jewelry, make up, or nail polish            Do not wear lotions, powders, perfumes/colognes, or deodorant.            Do not shave 48 hours prior to surgery.  Men may shave face and neck.            Do not bring valuables to the hospital.            [facility] is not responsible for any belongings or valuables.  Do NOT Smoke (Tobacco/Vapping) or drink Alcohol 24 hours prior to your procedure If you use a CPAP at night, you may bring all  equipment for your overnight stay.   Contacts, glasses, dentures or bridgework may not be worn into surgery.      For patients admitted to the hospital, discharge time will be determined by your treatment team.   Patients discharged the day of surgery will not be allowed to drive home, and someone needs to stay with them for 24 hours.    Special instructions:   [place]- Preparing For Surgery  Before surgery, you can play an important role. Because skin is not sterile, your skin needs to be as free of germs as possible. You can reduce the number of germs on your skin by washing with CHG (chlorahexidine gluconate) Soap before surgery.  CHG is an antiseptic cleaner which kills germs and bonds with the skin to continue killing germs even after washing.    Oral Hygiene is also important to reduce your risk of infection.  Remember - BRUSH YOUR TEETH THE MORNING OF SURGERY WITH YOUR REGULAR TOOTHPASTE  Please do not use if you have an allergy to CHG or antibacterial soaps. If your skin becomes reddened/irritated stop using the CHG.  Do not shave (including legs and underarms) for at least 48 hours prior to first CHG shower. It is OK to shave your face.  Please follow these instructions carefully.   1. Shower the NIGHT BEFORE SURGERY and the MORNING OF SURGERY with CHG Soap.   2. If you chose to wash your hair, wash your hair first as usual with your normal shampoo.  3. After you shampoo, rinse your hair and body thoroughly to remove the shampoo.  4. Use CHG as you would any other liquid soap. You can apply CHG directly to the skin and wash gently with a scrungie or a clean washcloth.   5. Apply the CHG Soap to your body ONLY FROM THE NECK DOWN.  Do not use on open wounds or open sores. Avoid contact with your eyes, ears, mouth and genitals (private parts). Wash Face and genitals (private parts)  with your normal soap.   6. Wash thoroughly, paying special attention to the area where your  surgery will be performed.  7. Thoroughly rinse your body with warm water from the neck down.  8. DO NOT shower/wash with your normal soap after using and rinsing off the CHG Soap.  9. Pat yourself dry with a CLEAN TOWEL.  10. Wear CLEAN PAJAMAS to bed the night before surgery, wear comfortable clothes the morning of surgery  11. Place CLEAN SHEETS on your bed the night of your first shower and DO NOT SLEEP WITH PETS.  Day of Surgery:   Do not apply any deodorants/lotions.  Please wear clean clothes to the hospital/surgery center.   Remember to brush your teeth WITH YOUR REGULAR TOOTHPASTE.   Please read over the following fact sheets that you were given.

## 2019-09-05 NOTE — Progress Notes (Signed)
PCP - Dr. [name] Cardiologist - denies  Chest x-ray - N/A EKG - 07/10/19 Stress Test - denies ECHO - denies Cardiac Cath - denies  Sleep Study - has had sleep study in past CPAP - does not use  Fasting Blood Sugar - pt states he does not know his fasting  Blood sugar levels Checks Blood Sugar once daily  Aspirin Instructions: Patient instructed to hold all Aspirin, NSAID's, herbal medications, fish oil and vitamins 7 days prior to surgery.   Anesthesia review:   Patient denies shortness of breath, fever, cough and chest pain at PAT appointment   Patient verbalized understanding of instructions that were given to them at the PAT appointment. Patient was also instructed that they will need to review over the PAT instructions again at home before surgery.

## 2019-09-06 ENCOUNTER — Encounter (HOSPITAL_COMMUNITY): Payer: Self-pay | Admitting: Neurosurgery

## 2019-09-06 NOTE — Anesthesia Preprocedure Evaluation (Addendum)
Anesthesia Evaluation  Patient identified by MRN, date of birth, ID band Patient awake    Reviewed: Allergy & Precautions, NPO status , Patient's Chart, lab work & pertinent test results  Airway Mallampati: II  TM Distance: >3 FB Neck ROM: Full    Dental no notable dental hx. (+) Teeth Intact, Caps   Pulmonary Current Smoker and Patient abstained from smoking.,    Pulmonary exam normal breath sounds clear to auscultation       Cardiovascular hypertension, + Peripheral Vascular Disease  Normal cardiovascular exam Rhythm:Regular Rate:Normal     Neuro/Psych PSYCHIATRIC DISORDERS Depression Lumbar radiculopathy Restless legs syndrome Diabetic polyneuropathy  Neuromuscular disease    GI/Hepatic negative GI ROS, Neg liver ROS,   Endo/Other  diabetes, Well Controlled, Type 2, Oral Hypoglycemic AgentsHyperlipidemia  Renal/GU negative Renal ROS   Hx/o prostate Ca S/P prostatectomy    Musculoskeletal  (+) Arthritis , Osteoarthritis,  Foraminal stenosis Lumbosaacral 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   Abdominal   Peds  Hematology  (+) anemia ,   Anesthesia Other Findings   Reproductive/Obstetrics                           Anesthesia Physical Anesthesia Plan  ASA: III  Anesthesia Plan: General   Post-op Pain Management:    Induction: Intravenous  PONV Risk Score and Plan: 2 and Ondansetron and Treatment may vary due to age or medical condition  Airway Management Planned: Oral ETT  Additional Equipment:   Intra-op Plan:   Post-operative Plan: Extubation in OR  Informed Consent: I have reviewed the patients History and Physical, chart, labs and discussed the procedure including the risks, benefits and alternatives for the proposed anesthesia with the patient or authorized representative who has indicated his/her understanding and acceptance.     Dental advisory  given  Plan Discussed with: CRNA and Surgeon  Anesthesia Plan Comments:        Anesthesia Quick Evaluation

## 2019-09-07 ENCOUNTER — Ambulatory Visit (HOSPITAL_COMMUNITY): Payer: Medicare PPO | Admitting: Anesthesiology

## 2019-09-07 ENCOUNTER — Observation Stay (HOSPITAL_COMMUNITY)
Admission: RE | Admit: 2019-09-07 | Discharge: 2019-09-08 | Disposition: A | Discharge status: Home / Self Care | Payer: Medicare PPO | Attending: Neurosurgery | Admitting: Neurosurgery

## 2019-09-07 ENCOUNTER — Encounter (HOSPITAL_COMMUNITY): Payer: Self-pay | Admitting: Neurosurgery

## 2019-09-07 ENCOUNTER — Encounter (HOSPITAL_COMMUNITY)
Admission: RE | Disposition: A | Discharge status: Home / Self Care | Payer: Self-pay | Source: Home / Self Care | Attending: Neurosurgery

## 2019-09-07 ENCOUNTER — Other Ambulatory Visit: Payer: Self-pay

## 2019-09-07 ENCOUNTER — Ambulatory Visit (HOSPITAL_COMMUNITY): Payer: Medicare PPO

## 2019-09-07 DIAGNOSIS — D649 Anemia, unspecified: Secondary | ICD-10-CM | POA: Diagnosis not present

## 2019-09-07 DIAGNOSIS — M5416 Radiculopathy, lumbar region: Secondary | ICD-10-CM | POA: Diagnosis not present

## 2019-09-07 DIAGNOSIS — F329 Major depressive disorder, single episode, unspecified: Secondary | ICD-10-CM | POA: Diagnosis not present

## 2019-09-07 DIAGNOSIS — Z79899 Other long term (current) drug therapy: Secondary | ICD-10-CM | POA: Diagnosis not present

## 2019-09-07 DIAGNOSIS — Z8546 Personal history of malignant neoplasm of prostate: Secondary | ICD-10-CM | POA: Insufficient documentation

## 2019-09-07 DIAGNOSIS — M48062 Spinal stenosis, lumbar region with neurogenic claudication: Secondary | ICD-10-CM | POA: Diagnosis not present

## 2019-09-07 DIAGNOSIS — G2581 Restless legs syndrome: Secondary | ICD-10-CM | POA: Diagnosis not present

## 2019-09-07 DIAGNOSIS — E1151 Type 2 diabetes mellitus with diabetic peripheral angiopathy without gangrene: Secondary | ICD-10-CM | POA: Diagnosis not present

## 2019-09-07 DIAGNOSIS — F17200 Nicotine dependence, unspecified, uncomplicated: Secondary | ICD-10-CM | POA: Diagnosis not present

## 2019-09-07 DIAGNOSIS — E1142 Type 2 diabetes mellitus with diabetic polyneuropathy: Secondary | ICD-10-CM | POA: Insufficient documentation

## 2019-09-07 DIAGNOSIS — I10 Essential (primary) hypertension: Secondary | ICD-10-CM | POA: Insufficient documentation

## 2019-09-07 DIAGNOSIS — E785 Hyperlipidemia, unspecified: Secondary | ICD-10-CM | POA: Diagnosis not present

## 2019-09-07 DIAGNOSIS — M4807 Spinal stenosis, lumbosacral region: Secondary | ICD-10-CM | POA: Diagnosis not present

## 2019-09-07 DIAGNOSIS — G609 Hereditary and idiopathic neuropathy, unspecified: Secondary | ICD-10-CM | POA: Insufficient documentation

## 2019-09-07 DIAGNOSIS — Z419 Encounter for procedure for purposes other than remedying health state, unspecified: Secondary | ICD-10-CM

## 2019-09-07 DIAGNOSIS — Z7984 Long term (current) use of oral hypoglycemic drugs: Secondary | ICD-10-CM | POA: Diagnosis not present

## 2019-09-07 DIAGNOSIS — M48061 Spinal stenosis, lumbar region without neurogenic claudication: Secondary | ICD-10-CM | POA: Diagnosis present

## 2019-09-07 HISTORY — PX: LUMBAR LAMINECTOMY/DECOMPRESSION MICRODISCECTOMY: SHX5026

## 2019-09-07 LAB — GLUCOSE, CAPILLARY
Glucose-Capillary: 101 mg/dL — ABNORMAL HIGH (ref 70–99)
Glucose-Capillary: 90 mg/dL (ref 70–99)
Glucose-Capillary: 95 mg/dL (ref 70–99)
Glucose-Capillary: 154 mg/dL — ABNORMAL HIGH (ref 70–99)
Glucose-Capillary: 134 mg/dL — ABNORMAL HIGH (ref 70–99)

## 2019-09-07 LAB — POCT I-STAT, CHEM 8
BUN: 29 mg/dL — ABNORMAL HIGH (ref 8–23)
Calcium, Ion: 1.25 mmol/L (ref 1.15–1.40)
Chloride: 101 mmol/L (ref 98–111)
Creatinine, Ser: 1.3 mg/dL — ABNORMAL HIGH (ref 0.61–1.24)
Glucose, Bld: 107 mg/dL — ABNORMAL HIGH (ref 70–99)
HCT: 36 % — ABNORMAL LOW (ref 39.0–52.0)
Hemoglobin: 12.2 g/dL — ABNORMAL LOW (ref 13.0–17.0)
Potassium: 4.5 mmol/L (ref 3.5–5.1)
Sodium: 140 mmol/L (ref 135–145)
TCO2: 29 mmol/L (ref 22–32)

## 2019-09-07 IMAGING — CR DG LUMBAR SPINE 2-3V
2 series · 2 of 2 positions shown · non-contrast
Comparison: MRI [DATE].

CLINICAL DATA: Lumbar surgery.

EXAM:
LUMBAR SPINE - 2-3 VIEW

[lateral (1 of 2)]
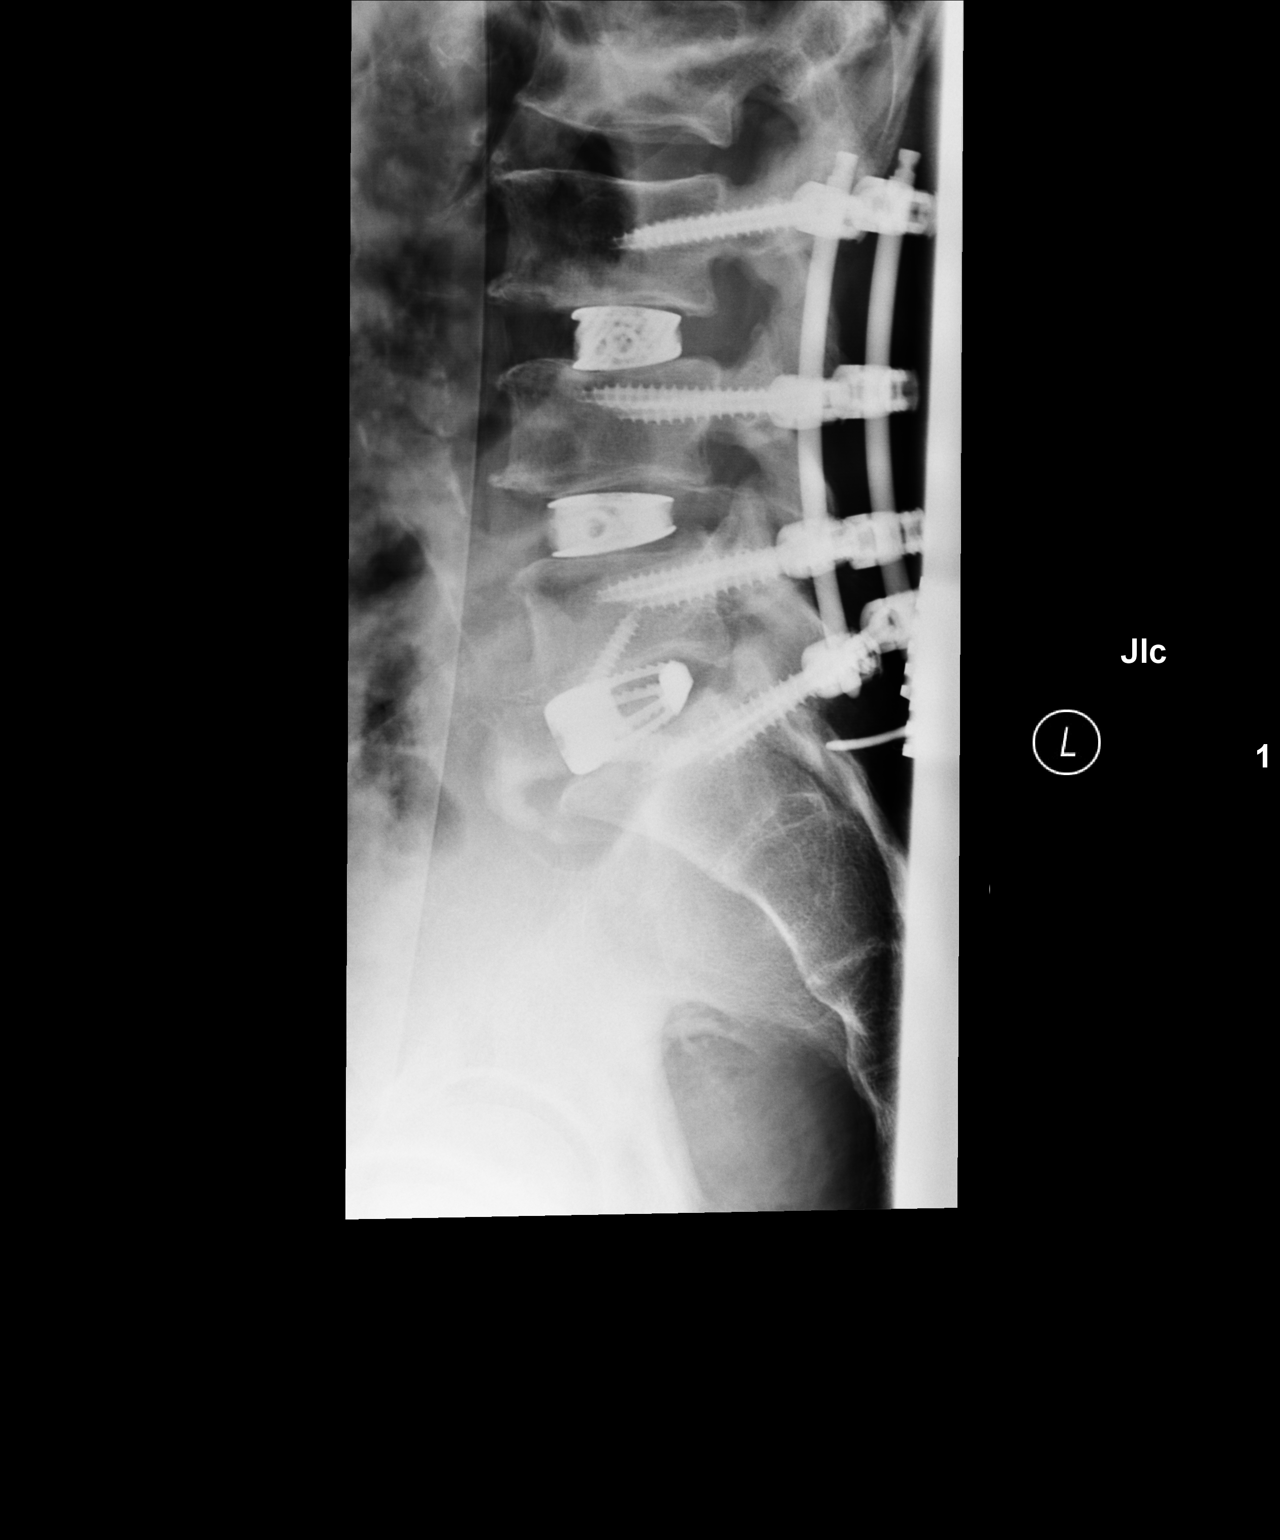

[lateral (2 of 2)]
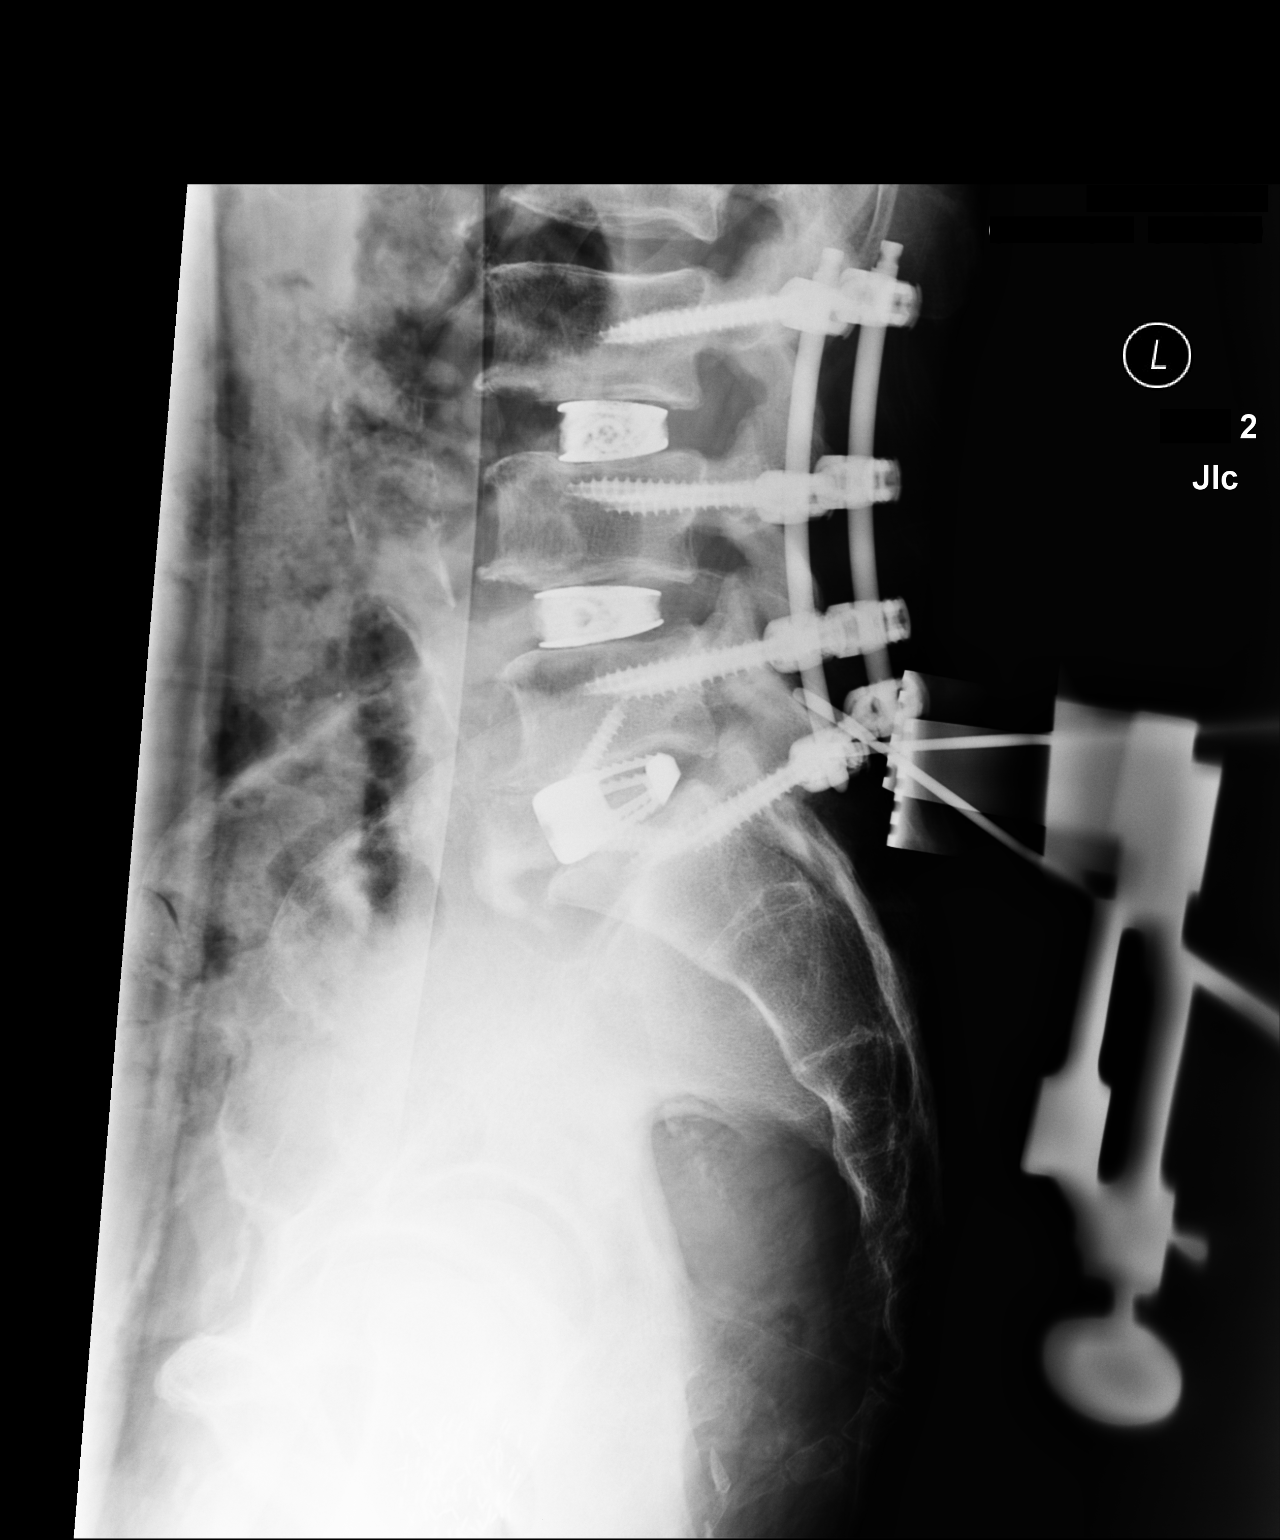

[2 of 2 positions shown; findings below may reference images not displayed]

FINDINGS: L3 through S1 posterior and interbody fusion. No acute bony
abnormality identified. No evidence of fracture. Aortoiliac
atherosclerotic vascular calcification.
IMPRESSION: L3 through S1 posterior interbody fusion. Hardware intact. Anatomic
alignment.

## 2019-09-07 SURGERY — LUMBAR LAMINECTOMY/DECOMPRESSION MICRODISCECTOMY 1 LEVEL
Anesthesia: General | Site: Spine Lumbar | Laterality: Bilateral

## 2019-09-07 MED ORDER — DAPAGLIFLOZIN PROPANEDIOL 10 MG PO TABS
10.0000 mg | ORAL_TABLET | Freq: Every day | ORAL | Status: DC
  Administered 2019-09-08: 10 mg via ORAL
  Filled 2019-09-07: qty 1

## 2019-09-07 MED ORDER — PREGABALIN 75 MG PO CAPS
75.0000 mg | ORAL_CAPSULE | Freq: Three times a day (TID) | ORAL | Status: DC
  Administered 2019-09-07 – 2019-09-08 (×3): 75 mg via ORAL
  Filled 2019-09-07 (×3): qty 1

## 2019-09-07 MED ORDER — OXYCODONE HCL 5 MG PO TABS: 5.0000 mg | ORAL_TABLET | ORAL | Status: DC | PRN

## 2019-09-07 MED ORDER — METHOCARBAMOL 1000 MG/10ML IJ SOLN
500.0000 mg | Freq: Four times a day (QID) | INTRAVENOUS | Status: DC | PRN
  Filled 2019-09-07: qty 5

## 2019-09-07 MED ORDER — METHOCARBAMOL 500 MG PO TABS: 500.0000 mg | ORAL_TABLET | Freq: Four times a day (QID) | ORAL | Status: DC | PRN

## 2019-09-07 MED ORDER — FLEET ENEMA 7-19 GM/118ML RE ENEM: 1.0000 | ENEMA | Freq: Once | RECTAL | Status: DC | PRN

## 2019-09-07 MED ORDER — POLYETHYLENE GLYCOL 3350 17 G PO PACK: 17.0000 g | PACK | Freq: Every day | ORAL | Status: DC | PRN

## 2019-09-07 MED ORDER — PRAVASTATIN SODIUM 40 MG PO TABS
40.0000 mg | ORAL_TABLET | Freq: Every day | ORAL | Status: DC
  Administered 2019-09-07: 40 mg via ORAL
  Filled 2019-09-07: qty 1

## 2019-09-07 MED ORDER — METFORMIN HCL 500 MG PO TABS
1000.0000 mg | ORAL_TABLET | Freq: Two times a day (BID) | ORAL | Status: DC
  Administered 2019-09-07 – 2019-09-08 (×2): 1000 mg via ORAL
  Filled 2019-09-07 (×2): qty 2

## 2019-09-07 MED ORDER — LIDOCAINE-EPINEPHRINE 1 %-1:100000 IJ SOLN
INTRAMUSCULAR | Status: DC | PRN
  Administered 2019-09-07: 5 mL

## 2019-09-07 MED ORDER — ACETAMINOPHEN 650 MG RE SUPP: 650.0000 mg | RECTAL | Status: DC | PRN

## 2019-09-07 MED ORDER — BISACODYL 10 MG RE SUPP: 10.0000 mg | Freq: Every day | RECTAL | Status: DC | PRN

## 2019-09-07 MED ORDER — ACETAMINOPHEN 325 MG PO TABS
650.0000 mg | ORAL_TABLET | ORAL | Status: DC | PRN
  Administered 2019-09-07 – 2019-09-08 (×3): 650 mg via ORAL
  Filled 2019-09-07 (×3): qty 2

## 2019-09-07 MED ORDER — MIDAZOLAM HCL 2 MG/2ML IJ SOLN
INTRAMUSCULAR | Status: AC
  Filled 2019-09-07: qty 2

## 2019-09-07 MED ORDER — CEFAZOLIN SODIUM-DEXTROSE 2-4 GM/100ML-% IV SOLN
2.0000 g | INTRAVENOUS | Status: AC
  Administered 2019-09-07: 2 g via INTRAVENOUS
  Filled 2019-09-07: qty 100

## 2019-09-07 MED ORDER — BUPIVACAINE HCL (PF) 0.5 % IJ SOLN
INTRAMUSCULAR | Status: AC
  Filled 2019-09-07: qty 30

## 2019-09-07 MED ORDER — CEFAZOLIN SODIUM-DEXTROSE 2-4 GM/100ML-% IV SOLN
2.0000 g | Freq: Three times a day (TID) | INTRAVENOUS | Status: AC
  Administered 2019-09-07 (×2): 2 g via INTRAVENOUS
  Filled 2019-09-07 (×2): qty 100

## 2019-09-07 MED ORDER — ONDANSETRON HCL 4 MG/2ML IJ SOLN
INTRAMUSCULAR | Status: DC | PRN
  Administered 2019-09-07: 4 mg via INTRAVENOUS

## 2019-09-07 MED ORDER — LIDOCAINE 2% (20 MG/ML) 5 ML SYRINGE
INTRAMUSCULAR | Status: AC
  Filled 2019-09-07: qty 5

## 2019-09-07 MED ORDER — METHYLPREDNISOLONE ACETATE 80 MG/ML IJ SUSP
INTRAMUSCULAR | Status: DC | PRN
  Administered 2019-09-07: 80 mg

## 2019-09-07 MED ORDER — SODIUM CHLORIDE 0.9% FLUSH
3.0000 mL | INTRAVENOUS | Status: DC | PRN
  Administered 2019-09-07: 3 mL via INTRAVENOUS

## 2019-09-07 MED ORDER — LACTATED RINGERS IV SOLN: INTRAVENOUS | Status: DC

## 2019-09-07 MED ORDER — MENTHOL 3 MG MT LOZG: 1.0000 | LOZENGE | OROMUCOSAL | Status: DC | PRN

## 2019-09-07 MED ORDER — CHLORHEXIDINE GLUCONATE 0.12 % MT SOLN
15.0000 mL | Freq: Once | OROMUCOSAL | Status: AC
  Administered 2019-09-07: 15 mL via OROMUCOSAL
  Filled 2019-09-07: qty 15

## 2019-09-07 MED ORDER — LIDOCAINE-EPINEPHRINE 1 %-1:100000 IJ SOLN
INTRAMUSCULAR | Status: AC
  Filled 2019-09-07: qty 1

## 2019-09-07 MED ORDER — DOCUSATE SODIUM 100 MG PO CAPS
100.0000 mg | ORAL_CAPSULE | Freq: Two times a day (BID) | ORAL | Status: DC
  Administered 2019-09-07 – 2019-09-08 (×2): 100 mg via ORAL
  Filled 2019-09-07 (×2): qty 1

## 2019-09-07 MED ORDER — KCL IN DEXTROSE-NACL 20-5-0.45 MEQ/L-%-% IV SOLN: INTRAVENOUS | Status: DC

## 2019-09-07 MED ORDER — HYDROMORPHONE HCL 1 MG/ML IJ SOLN: 0.5000 mg | INTRAMUSCULAR | Status: DC | PRN

## 2019-09-07 MED ORDER — SUGAMMADEX SODIUM 200 MG/2ML IV SOLN
INTRAVENOUS | Status: DC | PRN
  Administered 2019-09-07: 200 mg via INTRAVENOUS

## 2019-09-07 MED ORDER — DOCUSATE SODIUM 100 MG PO CAPS: 100.0000 mg | ORAL_CAPSULE | Freq: Every day | ORAL | Status: DC

## 2019-09-07 MED ORDER — ROCURONIUM BROMIDE 10 MG/ML (PF) SYRINGE
PREFILLED_SYRINGE | INTRAVENOUS | Status: DC | PRN
  Administered 2019-09-07: 60 mg via INTRAVENOUS

## 2019-09-07 MED ORDER — MELOXICAM 7.5 MG PO TABS
15.0000 mg | ORAL_TABLET | Freq: Every day | ORAL | Status: DC
  Administered 2019-09-08: 15 mg via ORAL
  Filled 2019-09-07: qty 2

## 2019-09-07 MED ORDER — THROMBIN 5000 UNITS EX SOLR
OROMUCOSAL | Status: DC | PRN
  Administered 2019-09-07: 5 mL via TOPICAL

## 2019-09-07 MED ORDER — SODIUM CHLORIDE 0.9 % IV SOLN: 250.0000 mL | INTRAVENOUS | Status: DC

## 2019-09-07 MED ORDER — CHLORHEXIDINE GLUCONATE CLOTH 2 % EX PADS: 6.0000 | MEDICATED_PAD | Freq: Once | CUTANEOUS | Status: DC

## 2019-09-07 MED ORDER — METHOCARBAMOL 500 MG PO TABS
500.0000 mg | ORAL_TABLET | Freq: Four times a day (QID) | ORAL | Status: DC | PRN
  Administered 2019-09-07 – 2019-09-08 (×2): 500 mg via ORAL
  Filled 2019-09-07 (×2): qty 1

## 2019-09-07 MED ORDER — LIDOCAINE 2% (20 MG/ML) 5 ML SYRINGE
INTRAMUSCULAR | Status: DC | PRN
  Administered 2019-09-07: 60 mg via INTRAVENOUS

## 2019-09-07 MED ORDER — FENTANYL CITRATE (PF) 100 MCG/2ML IJ SOLN
INTRAMUSCULAR | Status: AC
  Filled 2019-09-07: qty 2

## 2019-09-07 MED ORDER — METHYLPREDNISOLONE ACETATE 80 MG/ML IJ SUSP
INTRAMUSCULAR | Status: AC
  Filled 2019-09-07: qty 1

## 2019-09-07 MED ORDER — FENTANYL CITRATE (PF) 250 MCG/5ML IJ SOLN
INTRAMUSCULAR | Status: DC | PRN
  Administered 2019-09-07: 100 ug via INTRAVENOUS
  Administered 2019-09-07: 50 ug via INTRAVENOUS

## 2019-09-07 MED ORDER — VITAMIN B-12 1000 MCG PO TABS: 500.0000 ug | ORAL_TABLET | Freq: Every day | ORAL | Status: DC

## 2019-09-07 MED ORDER — ORAL CARE MOUTH RINSE: 15.0000 mL | Freq: Once | OROMUCOSAL | Status: DC

## 2019-09-07 MED ORDER — THROMBIN 5000 UNITS EX SOLR
CUTANEOUS | Status: AC
  Filled 2019-09-07: qty 5000

## 2019-09-07 MED ORDER — 0.9 % SODIUM CHLORIDE (POUR BTL) OPTIME
TOPICAL | Status: DC | PRN
  Administered 2019-09-07: 1000 mL

## 2019-09-07 MED ORDER — HYDROCODONE-ACETAMINOPHEN 5-325 MG PO TABS: 2.0000 | ORAL_TABLET | ORAL | Status: DC | PRN

## 2019-09-07 MED ORDER — ADULT MULTIVITAMIN W/MINERALS CH
1.0000 | ORAL_TABLET | Freq: Every day | ORAL | Status: DC
  Administered 2019-09-08: 1 via ORAL
  Filled 2019-09-07: qty 1

## 2019-09-07 MED ORDER — INSULIN ASPART 100 UNIT/ML ~~LOC~~ SOLN: 0.0000 [IU] | Freq: Every day | SUBCUTANEOUS | Status: DC

## 2019-09-07 MED ORDER — FENTANYL CITRATE (PF) 100 MCG/2ML IJ SOLN: 25.0000 ug | INTRAMUSCULAR | Status: DC | PRN

## 2019-09-07 MED ORDER — ROPINIROLE HCL 1 MG PO TABS
4.0000 mg | ORAL_TABLET | Freq: Every day | ORAL | Status: DC
  Administered 2019-09-07: 4 mg via ORAL
  Filled 2019-09-07: qty 4

## 2019-09-07 MED ORDER — FENTANYL CITRATE (PF) 250 MCG/5ML IJ SOLN
INTRAMUSCULAR | Status: AC
  Filled 2019-09-07: qty 5

## 2019-09-07 MED ORDER — PROPOFOL 10 MG/ML IV BOLUS
INTRAVENOUS | Status: AC
  Filled 2019-09-07: qty 20

## 2019-09-07 MED ORDER — ZOLPIDEM TARTRATE 5 MG PO TABS: 5.0000 mg | ORAL_TABLET | Freq: Every evening | ORAL | Status: DC | PRN

## 2019-09-07 MED ORDER — ROCURONIUM BROMIDE 10 MG/ML (PF) SYRINGE
PREFILLED_SYRINGE | INTRAVENOUS | Status: AC
  Filled 2019-09-07: qty 10

## 2019-09-07 MED ORDER — ONDANSETRON HCL 4 MG/2ML IJ SOLN: 4.0000 mg | Freq: Four times a day (QID) | INTRAMUSCULAR | Status: DC | PRN

## 2019-09-07 MED ORDER — MIDAZOLAM HCL 2 MG/2ML IJ SOLN
INTRAMUSCULAR | Status: DC | PRN
  Administered 2019-09-07 (×2): 1 mg via INTRAVENOUS

## 2019-09-07 MED ORDER — ONDANSETRON HCL 4 MG/2ML IJ SOLN: 4.0000 mg | Freq: Once | INTRAMUSCULAR | Status: DC | PRN

## 2019-09-07 MED ORDER — DEXAMETHASONE SODIUM PHOSPHATE 10 MG/ML IJ SOLN: INTRAMUSCULAR | Status: DC | PRN

## 2019-09-07 MED ORDER — DULAGLUTIDE 1.5 MG/0.5ML ~~LOC~~ SOAJ: 1.5000 mg | SUBCUTANEOUS | Status: DC

## 2019-09-07 MED ORDER — HYDROCODONE-ACETAMINOPHEN 7.5-325 MG PO TABS: 1.0000 | ORAL_TABLET | ORAL | Status: DC | PRN

## 2019-09-07 MED ORDER — ALUM & MAG HYDROXIDE-SIMETH 200-200-20 MG/5ML PO SUSP: 30.0000 mL | Freq: Four times a day (QID) | ORAL | Status: DC | PRN

## 2019-09-07 MED ORDER — ORAL CARE MOUTH RINSE: 15.0000 mL | Freq: Once | OROMUCOSAL | Status: AC

## 2019-09-07 MED ORDER — PHENYLEPHRINE 40 MCG/ML (10ML) SYRINGE FOR IV PUSH (FOR BLOOD PRESSURE SUPPORT)
PREFILLED_SYRINGE | INTRAVENOUS | Status: DC | PRN
  Administered 2019-09-07 (×4): 80 ug via INTRAVENOUS
  Administered 2019-09-07: 40 ug via INTRAVENOUS

## 2019-09-07 MED ORDER — FENTANYL CITRATE (PF) 100 MCG/2ML IJ SOLN
INTRAMUSCULAR | Status: DC | PRN
  Administered 2019-09-07: 100 ug via INTRAVENOUS

## 2019-09-07 MED ORDER — PHENYLEPHRINE 40 MCG/ML (10ML) SYRINGE FOR IV PUSH (FOR BLOOD PRESSURE SUPPORT)
PREFILLED_SYRINGE | INTRAVENOUS | Status: AC
  Filled 2019-09-07: qty 10

## 2019-09-07 MED ORDER — PANTOPRAZOLE SODIUM 40 MG IV SOLR
40.0000 mg | Freq: Every day | INTRAVENOUS | Status: DC
  Administered 2019-09-07: 40 mg via INTRAVENOUS
  Filled 2019-09-07: qty 40

## 2019-09-07 MED ORDER — PHENOL 1.4 % MT LIQD: 1.0000 | OROMUCOSAL | Status: DC | PRN

## 2019-09-07 MED ORDER — SODIUM CHLORIDE 0.9% FLUSH: 3.0000 mL | Freq: Two times a day (BID) | INTRAVENOUS | Status: DC

## 2019-09-07 MED ORDER — ONDANSETRON HCL 4 MG PO TABS: 4.0000 mg | ORAL_TABLET | Freq: Four times a day (QID) | ORAL | Status: DC | PRN

## 2019-09-07 MED ORDER — OXYCODONE-ACETAMINOPHEN 10-325 MG PO TABS: 1.0000 | ORAL_TABLET | ORAL | Status: DC | PRN

## 2019-09-07 MED ORDER — BUPIVACAINE HCL (PF) 0.5 % IJ SOLN
INTRAMUSCULAR | Status: DC | PRN
  Administered 2019-09-07: 5 mL

## 2019-09-07 MED ORDER — ONDANSETRON HCL 4 MG/2ML IJ SOLN
INTRAMUSCULAR | Status: AC
  Filled 2019-09-07: qty 2

## 2019-09-07 MED ORDER — PROPOFOL 10 MG/ML IV BOLUS
INTRAVENOUS | Status: DC | PRN
  Administered 2019-09-07: 140 mg via INTRAVENOUS
  Administered 2019-09-07: 10 mg via INTRAVENOUS

## 2019-09-07 MED ORDER — CHLORHEXIDINE GLUCONATE 0.12 % MT SOLN: 15.0000 mL | Freq: Once | OROMUCOSAL | Status: DC

## 2019-09-07 MED ORDER — INSULIN ASPART 100 UNIT/ML ~~LOC~~ SOLN
0.0000 [IU] | Freq: Three times a day (TID) | SUBCUTANEOUS | Status: DC
  Administered 2019-09-08: 2 [IU] via SUBCUTANEOUS

## 2019-09-07 SURGICAL SUPPLY — 60 items
ADH SKN CLS APL DERMABOND .7 (GAUZE/BANDAGES/DRESSINGS) ×1
BAND INSRT 18 STRL LF DISP RB (MISCELLANEOUS) ×2
BAND RUBBER #18 3X1/16 STRL (MISCELLANEOUS) ×4 IMPLANT
BLADE CLIPPER SURG (BLADE) IMPLANT
BUR MATCHSTICK NEURO 3.0 LAGG (BURR) ×2 IMPLANT
BUR ROUND FLUTED 5 RND (BURR) ×2 IMPLANT
CANISTER SUCT 3000ML PPV (MISCELLANEOUS) ×2 IMPLANT
CARTRIDGE OIL MAESTRO DRILL (MISCELLANEOUS) ×1 IMPLANT
COVER WAND RF STERILE (DRAPES) ×1 IMPLANT
DECANTER SPIKE VIAL GLASS SM (MISCELLANEOUS) ×2 IMPLANT
DERMABOND ADVANCED (GAUZE/BANDAGES/DRESSINGS) ×1
DERMABOND ADVANCED .7 DNX12 (GAUZE/BANDAGES/DRESSINGS) ×1 IMPLANT
DIFFUSER DRILL AIR PNEUMATIC (MISCELLANEOUS) ×2 IMPLANT
DRAPE LAPAROTOMY 100X72X124 (DRAPES) ×2 IMPLANT
DRAPE MICROSCOPE LEICA (MISCELLANEOUS) ×2 IMPLANT
DRAPE SURG 17X23 STRL (DRAPES) ×2 IMPLANT
DRSG OPSITE POSTOP 4X6 (GAUZE/BANDAGES/DRESSINGS) ×1 IMPLANT
DURAPREP 26ML APPLICATOR (WOUND CARE) ×2 IMPLANT
ELECT REM PT RETURN 9FT ADLT (ELECTROSURGICAL) ×2
ELECTRODE REM PT RTRN 9FT ADLT (ELECTROSURGICAL) ×1 IMPLANT
GAUZE 4X4 16PLY RFD (DISPOSABLE) IMPLANT
GAUZE SPONGE 4X4 12PLY STRL (GAUZE/BANDAGES/DRESSINGS) IMPLANT
GLOVE BIO SURGEON STRL SZ7 (GLOVE) ×1 IMPLANT
GLOVE BIO SURGEON STRL SZ8 (GLOVE) ×3 IMPLANT
GLOVE BIOGEL PI IND STRL 6.5 (GLOVE) IMPLANT
GLOVE BIOGEL PI IND STRL 8 (GLOVE) ×1 IMPLANT
GLOVE BIOGEL PI IND STRL 8.5 (GLOVE) ×1 IMPLANT
GLOVE BIOGEL PI INDICATOR 6.5 (GLOVE) ×2
GLOVE BIOGEL PI INDICATOR 8 (GLOVE) ×1
GLOVE BIOGEL PI INDICATOR 8.5 (GLOVE) ×2
GLOVE ECLIPSE 8.0 STRL XLNG CF (GLOVE) ×2 IMPLANT
GLOVE EXAM NITRILE XL STR (GLOVE) IMPLANT
GLOVE SURG SS PI 6.0 STRL IVOR (GLOVE) ×4 IMPLANT
GOWN STRL REUS W/ TWL LRG LVL3 (GOWN DISPOSABLE) IMPLANT
GOWN STRL REUS W/ TWL XL LVL3 (GOWN DISPOSABLE) ×1 IMPLANT
GOWN STRL REUS W/TWL 2XL LVL3 (GOWN DISPOSABLE) ×2 IMPLANT
GOWN STRL REUS W/TWL LRG LVL3 (GOWN DISPOSABLE) ×4
GOWN STRL REUS W/TWL XL LVL3 (GOWN DISPOSABLE) ×4
HEMOSTAT POWDER KIT SURGIFOAM (HEMOSTASIS) ×2 IMPLANT
KIT BASIN OR (CUSTOM PROCEDURE TRAY) ×2 IMPLANT
KIT TURNOVER KIT B (KITS) ×2 IMPLANT
NDL HYPO 18GX1.5 BLUNT FILL (NEEDLE) IMPLANT
NDL HYPO 25X1 1.5 SAFETY (NEEDLE) ×1 IMPLANT
NDL SPNL 18GX3.5 QUINCKE PK (NEEDLE) ×1 IMPLANT
NEEDLE HYPO 18GX1.5 BLUNT FILL (NEEDLE) ×2 IMPLANT
NEEDLE HYPO 25X1 1.5 SAFETY (NEEDLE) ×2 IMPLANT
NEEDLE SPNL 18GX3.5 QUINCKE PK (NEEDLE) ×2 IMPLANT
NS IRRIG 1000ML POUR BTL (IV SOLUTION) ×2 IMPLANT
OIL CARTRIDGE MAESTRO DRILL (MISCELLANEOUS) ×2
PACK LAMINECTOMY NEURO (CUSTOM PROCEDURE TRAY) ×2 IMPLANT
PAD ARMBOARD 7.5X6 YLW CONV (MISCELLANEOUS) ×8 IMPLANT
SPONGE SURGIFOAM ABS GEL SZ50 (HEMOSTASIS) IMPLANT
SUT VIC AB 0 CT1 18XCR BRD8 (SUTURE) ×1 IMPLANT
SUT VIC AB 0 CT1 8-18 (SUTURE) ×2
SUT VIC AB 2-0 CT1 18 (SUTURE) ×2 IMPLANT
SUT VIC AB 3-0 SH 8-18 (SUTURE) ×2 IMPLANT
SYR 5ML LL (SYRINGE) ×1 IMPLANT
TOWEL GREEN STERILE (TOWEL DISPOSABLE) ×2 IMPLANT
TOWEL GREEN STERILE FF (TOWEL DISPOSABLE) ×2 IMPLANT
WATER STERILE IRR 1000ML POUR (IV SOLUTION) ×2 IMPLANT

## 2019-09-07 NOTE — Anesthesia Postprocedure Evaluation (Signed)
Anesthesia Post Note  Patient: Harry Zamora [facility]  Procedure(s) Performed: Bilateral Lumbar Five - Sacral One Foraminotomy (Bilateral Spine Lumbar)     Patient location during evaluation: PACU Anesthesia Type: General Level of consciousness: awake and alert and oriented Pain management: pain level controlled Vital Signs Assessment: post-procedure vital signs reviewed and stable Respiratory status: spontaneous breathing, nonlabored ventilation and respiratory function stable Cardiovascular status: blood pressure returned to baseline and stable Postop Assessment: no apparent nausea or vomiting Anesthetic complications: no   No complications documented.  Last Vitals:  Vitals:   09/07/19 1030 09/07/19 1100  BP: (!) 165/94 (!) 143/95  Pulse: 72 67  Resp: 14 10  Temp:  36.5 C  SpO2: 98% 99%    Last Pain:  Vitals:   09/07/19 1100  TempSrc:   PainSc: 0-No pain                 Aleem Elza A.

## 2019-09-07 NOTE — Interval H&P Note (Signed)
History and Physical Interval Note:  09/07/2019 7:20 AM  Harry Zamora  has presented today for surgery, with the diagnosis of Foraminal stenosis of lumbosacral region.  The various methods of treatment have been discussed with the patient and family. After consideration of risks, benefits and other options for treatment, the patient has consented to  Procedure(s) with comments: Bilateral Lumbar 5 Sacral Foraminotomy (Bilateral) - 3C as a surgical intervention.  The patient's history has been reviewed, patient examined, no change in status, stable for surgery.  I have reviewed the patient's chart and labs.  Questions were answered to the patient's satisfaction.     Peggyann Shoals

## 2019-09-07 NOTE — Anesthesia Procedure Notes (Signed)
Procedure Name: Intubation Date/Time: 09/07/2019 7:36 AM Performed by: Verdie Drown, CRNA Pre-anesthesia Checklist: Patient identified, Emergency Drugs available, Suction available and Patient being monitored Patient Re-evaluated:Patient Re-evaluated prior to induction Oxygen Delivery Method: Circle System Utilized Preoxygenation: Pre-oxygenation with 100% oxygen Induction Type: IV induction Ventilation: Mask ventilation without difficulty Laryngoscope Size: 4 and Mac Grade View: Grade I Tube type: Oral Number of attempts: 1 Airway Equipment and Method: Oral airway,  Video-laryngoscopy and Rigid stylet Placement Confirmation: ETT inserted through vocal cords under direct vision,  positive ETCO2 and breath sounds checked- equal and bilateral Secured at: 22 cm Tube secured with: Tape Dental Injury: Teeth and Oropharynx as per pre-operative assessment  Difficulty Due To: Difficulty was anticipated, Difficult Airway- due to reduced neck mobility and Difficult Airway- due to anterior larynx

## 2019-09-07 NOTE — H&P (Signed)
Patient ID:   [id number] Patient: Harry Zamora  Date of Birth: 08-Apr-1940 Visit Type: Office Visit   Date: 08/30/2019 12:45 PM Provider: Marchia Meiers. Vertell Limber MD   This 79 year old male presents for back pain.  HISTORY OF PRESENT ILLNESS:  1.  back pain  I reviewed the patient's MRI of his lumbar spine and while his L3-4 and L4-5 levels appear satisfactory without evidence of any new disc herniations, the radiologist pointed out that there is bilateral L5-S1 foraminal stenosis causing impingement on the bilateral L5 nerve roots.  I believe this is likely the cause of his persistent right greater than left leg and he now has some EHL weakness and dorsiflexion weakness on the right at 4/5  The patient is not better with medications and is describing pain at 6/10.  He says he fell earlier in the day in his left hip.  I have recommended to the patient based on his persistent pain and foraminal narrowing of L5-S1 level bilaterally with L5 nerve root encroachment, that he undergo redo bilateral L5-S1 foraminotomies take pressure off the nerves.         Medical/Surgical/Interim History Reviewed, no change.  Last detailed document date:09/14/2016.     PAST MEDICAL HISTORY, SURGICAL HISTORY, FAMILY HISTORY, SOCIAL HISTORY AND REVIEW OF SYSTEMS I have reviewed the patient's past medical, surgical, family and social history as well as the comprehensive review of systems as included on the [facility] history form dated 07/19/2019, which I have signed.  Family History:  Reviewed, no changes.  Last detailed document date:09/14/2016.   Social History: Reviewed, no changes. Last detailed document date: 09/14/2016.    MEDICATIONS: (added, continued or stopped this visit) Started Medication Directions Instruction Stopped  08/24/2019 Cymbalta 30 mg capsule,delayed release take 1 capsule by oral route  every day     Farxiga 5 mg tablet take 1 tablet by oral route   every day in the morning     glipizide 5 mg tablet take 1 tablet by oral route 2 times every day before meals    07/26/2019 hydrocodone 10 mg-acetaminophen 325 mg tablet take 1 tablet by oral route  every 6 - 8 hours as needed for pain    03/31/2019 hydrocodone 5 mg-acetaminophen 325 mg tablet take 1 tablet by oral route  every 8 hours as needed for pain     lisinopril 20 mg tablet take 1 tablet by oral route  every day     lovastatin 40 mg tablet take 1 tablet by oral route  every day with the evening meal    07/24/2019 Medrol (Pak) 4 mg tablets in a dose pack take by Oral route as directed     metformin 500 mg tablet take 1 tablet by oral route 2 times every day with morning and evening meals    08/16/2019 Percocet 10 mg-325 mg tablet take 1 tablet by oral route  every 6 hours as needed     ropinirole 4 mg tablet take 1 tablet by oral route  every day       ALLERGIES: Ingredient Reaction Medication Name Comment  NIACIN Rash     Reviewed, no changes.    PHYSICAL EXAM:   Vitals Date Temp F BP Pulse Ht In Wt Lb BMI BSA Pain Score  08/30/2019  114/62 87 74 162 20.8  6/10      IMPRESSION:   The patient has evidence of L5 radiculopathies and I think this is likely caused by  L5-S1 foraminal stenosis.  He has not improved with medications and still having a considerable amount pain  PLAN:  Bilateral L5-S1 redo foraminotomies for lumbar spinal stenosis and persistent L5 radiculopathies   Assessment/Plan   # Detail Type Description   1. 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       2. Assessment Low back pain, unspecified back pain laterality, with sciatica presence unspecified (M54.5).       3. 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       4. 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       5. Assessment Radiculopathy, lumbar region (M54.16).         Pain Management Plan Pain Scale: 6/10. Method: Numeric Pain  Intensity Scale. Location: back, legs. Onset: 09/02/2018. Duration: varies. Quality: discomforting. Pain management follow-up plan of care: Patient will continue medication management..              Provider:  Marchia Meiers. Vertell Limber MD  09/02/2019 03:50 PM    Dictation edited by: Marchia Meiers. Vertell Limber    CC Providers: [name] [facility] [address] [place],  [place]  [address]-   Jonathin Heinicke MD  [address] [place], [place] [address]               Electronically signed by Marchia Meiers. Vertell Limber MD on 09/02/2019 03:50 PM

## 2019-09-07 NOTE — Transfer of Care (Signed)
Immediate Anesthesia Transfer of Care Note  Patient: Harry Zamora [facility]  Procedure(s) Performed: Bilateral Lumbar Five - Sacral One Foraminotomy (Bilateral Spine Lumbar)  Patient Location: PACU  Anesthesia Type:General  Level of Consciousness: awake, patient cooperative and responds to stimulation  Airway & Oxygen Therapy: Patient Spontanous Breathing and Patient connected to nasal cannula oxygen  Post-op Assessment: Report given to RN, Post -op Vital signs reviewed and stable and Patient moving all extremities X 4  Post vital signs: Reviewed and stable  Last Vitals:  Vitals Value Taken Time  BP 164/84 09/07/19 0956  Temp    Pulse 71 09/07/19 1003  Resp 25 09/07/19 1003  SpO2 99 % 09/07/19 1003  Vitals shown include unvalidated device data.  Last Pain:  Vitals:   09/07/19 0952  TempSrc:   PainSc: (P) 0-No pain      Patients Stated Pain Goal: 3 (35/59/74 1638)  Complications: No complications documented.

## 2019-09-07 NOTE — Evaluation (Signed)
Physical Therapy Evaluation and Discharge Patient Details Name: Harry Zamora MRN: [id number] DOB: 11/06/1940 Today's Date: 09/07/2019   History of Present Illness  Pt is an 80 y/o male s/p L5-S1 foraminotomy. PMH includes HTN, PVD, and DM.   Clinical Impression  Patient evaluated by Physical Therapy with no further acute PT needs identified. All education has been completed and the patient has no further questions. Pt overall steady with gait and stair navigation, functioning at a supervision to mod I level. Pt familiar with all back precautions and generalized walking program secondary to previous back surgery. See below for any follow-up Physical Therapy or equipment needs. PT is signing off. Thank you for this referral. If needs change, please re-consult.      Follow Up Recommendations No PT follow up    Equipment Recommendations  None recommended by PT    Recommendations for Other Services       Precautions / Restrictions Precautions Precautions: Back Precaution Booklet Issued: No Precaution Comments: Pt able to recall back precautions from previous surgery .  Restrictions Weight Bearing Restrictions: No      Mobility  Bed Mobility Overal bed mobility: Modified Independent             General bed mobility comments: Able to recall log roll technique from previous admission.   Transfers Overall transfer level: Modified independent Equipment used: None                Ambulation/Gait Ambulation/Gait assistance: Modified independent (Device/Increase time) Gait Distance (Feet): 300 Feet Assistive device: IV Pole Gait Pattern/deviations: Step-through pattern;Decreased stride length Gait velocity: Decreased   General Gait Details: Slower gait speed, but overall steady. No LOB noted. Educated about walking program to perform at home.   Stairs Stairs: Yes Stairs assistance: Supervision Stair Management: One rail Right;Step to pattern Number of Stairs:  1 General stair comments: Overall steady stair navigation. No LOB noted.   Wheelchair Mobility    Modified Rankin (Stroke Patients Only)       Balance Overall balance assessment: No apparent balance deficits (not formally assessed)                                           Pertinent Vitals/Pain Pain Assessment: 0-10 Pain Score: 1  Pain Location: back  Pain Descriptors / Indicators: Aching;Operative site guarding Pain Intervention(s): Monitored during session;Limited activity within patient's tolerance;Repositioned    Home Living Family/patient expects to be discharged to:: Private residence Living Arrangements: Spouse/significant other Available Help at Discharge: Family;Available 24 hours/day Type of Home: House Home Access: Stairs to enter Entrance Stairs-Rails: None Entrance Stairs-Number of Steps: 1 Home Layout: One level Home Equipment: Toilet riser;Shower seat;Walker - 2 wheels;Cane - single point      Prior Function Level of Independence: Independent with assistive device(s)         Comments: Was using cane for ambulation      Hand Dominance        Extremity/Trunk Assessment   Upper Extremity Assessment Upper Extremity Assessment: Overall WFL for tasks assessed    Lower Extremity Assessment Lower Extremity Assessment: Overall WFL for tasks assessed    Cervical / Trunk Assessment Cervical / Trunk Assessment: Other exceptions Cervical / Trunk Exceptions: s/p lumbar sx  Communication   Communication: No difficulties  Cognition Arousal/Alertness: Awake/alert Behavior During Therapy: WFL for tasks assessed/performed Overall Cognitive Status: Within Functional Limits  for tasks assessed                                        General Comments General comments (skin integrity, edema, etc.): Educated about maintaining precautions during ADLs. Also educated about car transfer technique. Pt familiar with all education.      Exercises     Assessment/Plan    PT Assessment Patent does not need any further PT services  PT Problem List         PT Treatment Interventions      PT Goals (Current goals can be found in the Care Plan section)  Acute Rehab PT Goals Patient Stated Goal: to go home PT Goal Formulation: All assessment and education complete, DC therapy Time For Goal Achievement: 09/07/19 Potential to Achieve Goals: Good    Frequency     Barriers to discharge        Co-evaluation               AM-PAC PT "6 Clicks" Mobility  Outcome Measure Help needed turning from your back to your side while in a flat bed without using bedrails?: None Help needed moving from lying on your back to sitting on the side of a flat bed without using bedrails?: None Help needed moving to and from a bed to a chair (including a wheelchair)?: None Help needed standing up from a chair using your arms (e.g., wheelchair or bedside chair)?: None Help needed to walk in hospital room?: None Help needed climbing 3-5 steps with a railing? : None 6 Click Score: 24    End of Session Equipment Utilized During Treatment: Gait belt Activity Tolerance: Patient tolerated treatment well Patient left: in bed;with call bell/phone within reach Nurse Communication: Mobility status PT Visit Diagnosis: Other abnormalities of gait and mobility (R26.89)    Time: 1412-1430 PT Time Calculation (min) (ACUTE ONLY): 18 min   Charges:   PT Evaluation $PT Eval Low Complexity: 1 Low          Lou Miner, DPT  Acute Rehabilitation Services  Pager: [phone] Office: [phone]   Rudean Hitt 09/07/2019, 5:48 PM

## 2019-09-07 NOTE — Op Note (Signed)
09/07/2019  9:53 AM  PATIENT:  Harry Zamora  79 y.o. male  PRE-OPERATIVE DIAGNOSIS:  Foraminal stenosis of lumbosacral region, degenerative lumbar stenosis, radiculopathy, lumbago, diabetic peripheral neuropathy  POST-OPERATIVE DIAGNOSIS:   Foraminal stenosis of lumbosacral region, degenerative lumbar stenosis, radiculopathy, lumbago, diabetic peripheral neuropathy   PROCEDURE:  Procedure(s) with comments: Bilateral Lumbar Five - Sacral One Foraminotomy (Bilateral) - posterior   SURGEON:  Surgeon(s) and Role:    Erline Levine, MD - Primary  PHYSICIAN ASSISTANT:   ASSISTANTS: Poteat, RN   Glenford Peers, NP   ANESTHESIA:   general  EBL:  75 mL   BLOOD ADMINISTERED:none  DRAINS: none   LOCAL MEDICATIONS USED:  LIDOCAINE   SPECIMEN:  No Specimen  DISPOSITION OF SPECIMEN:  N/A  COUNTS:  YES  TOURNIQUET:  * No tourniquets in log *  DICTATION: Patient has bilateral foraminal stenosis at L5 S 1 with right leg weakness and intractable pain into both legs.  He is also diabetic with peripheral neuropathy, but pain in his legs increased after ALIF L 5 S 1 level and postop MRI showed severe foraminal stenosis at the L 5 S 1 level affecting both L 5 nerve roots, so it was elected to take him to surgery for bilateral L 5 S 1 laminectomy and foraminotomy.  He had previously undergone posterior total laminectomy at this level for spinal stenosis.  Procedure: Patient was brought to the operating room and following the smooth and uncomplicated induction of general endotracheal anesthesia he was placed in a prone position on the Wilson frame. Low back was prepped and draped in the usual sterile fashion with betadine scrub and DuraPrep. Area of planned incision was infiltrated with local lidocaine. Incision was made in the midline and carried to the lumbodorsal fascia which was incised on both sides of midline. Subperiosteal dissection was performed exposing what was felt to be L 5 S 1 level.  Intraoperative x-ray demonstrated marker probe at L 5 S 1. Bilateral foraminotomies of L5 S 1 was performed with leksell, then a high-speed drill and completed with Kerrison rongeurs and facetectomy was performed bilaterally with removal of superior articular process of S 1 and wide decompression of both L 5 nerve roots.  A generous foraminotomy was performed to decompress both  L 5 and S 1 nerves. Ligamentum flavum was detached and removed in a piecemeal fashion where it was causing nerve root compression. Angled curettes were used to detatch and then remove thickened compressive ligamentous material.  At this point it was felt that all neural elements were well decompressed. The wound was then irrigated with saline. Hemostasis was assured with bipolar electrocautery and Surgifoam and the interspace was irrigated with Depo-Medrol and fentanyl. The lumbodorsal fascia was closed with 0 Vicryl sutures the subcutaneous tissues reapproximated 2-0 Vicryl inverted sutures and the skin edges were reapproximated with 3-0 Vicryl subcuticular stitch. The wound is dressed with Dermabond and an occlusive dressing. Patient was extubated in the operating room and taken to recovery in stable and satisfactory condition having tolerated his operation well counts were correct at the end of the case.    PLAN OF CARE: Admit for overnight observation  PATIENT DISPOSITION:  PACU - hemodynamically stable.   Delay start of Pharmacological VTE agent (>24hrs) due to surgical blood loss or risk of bleeding: yes

## 2019-09-07 NOTE — Brief Op Note (Signed)
09/07/2019  9:53 AM  PATIENT:  Harry Zamora  79 y.o. male  PRE-OPERATIVE DIAGNOSIS:  Foraminal stenosis of lumbosacral region, degenerative lumbar stenosis, radiculopathy, lumbago, diabetic peripheral neuropathy  POST-OPERATIVE DIAGNOSIS:   Foraminal stenosis of lumbosacral region, degenerative lumbar stenosis, radiculopathy, lumbago, diabetic peripheral neuropathy   PROCEDURE:  Procedure(s) with comments: Bilateral Lumbar Five - Sacral One Foraminotomy (Bilateral) - posterior   SURGEON:  Surgeon(s) and Role:    Erline Levine, MD - Primary  PHYSICIAN ASSISTANT:   ASSISTANTS: Poteat, RN   Glenford Peers, NP   ANESTHESIA:   general  EBL:  75 mL   BLOOD ADMINISTERED:none  DRAINS: none   LOCAL MEDICATIONS USED:  LIDOCAINE   SPECIMEN:  No Specimen  DISPOSITION OF SPECIMEN:  N/A  COUNTS:  YES  TOURNIQUET:  * No tourniquets in log *  DICTATION: Patient has bilateral foraminal stenosis at L5 S 1 with right leg weakness and intractable pain into both legs.  He is also diabetic with peripheral neuropathy, but pain in his legs increased after ALIF L 5 S 1 level and postop MRI showed severe foraminal stenosis at the L 5 S 1 level affecting both L 5 nerve roots, so it was elected to take him to surgery for bilateral L 5 S 1 laminectomy and foraminotomy.  He had previously undergone posterior total laminectomy at this level for spinal stenosis.  Procedure: Patient was brought to the operating room and following the smooth and uncomplicated induction of general endotracheal anesthesia he was placed in a prone position on the Wilson frame. Low back was prepped and draped in the usual sterile fashion with betadine scrub and DuraPrep. Area of planned incision was infiltrated with local lidocaine. Incision was made in the midline and carried to the lumbodorsal fascia which was incised on both sides of midline. Subperiosteal dissection was performed exposing what was felt to be L 5 S 1 level.  Intraoperative x-ray demonstrated marker probe at L 5 S 1. Bilateral foraminotomies of L5 S 1 was performed with leksell, then a high-speed drill and completed with Kerrison rongeurs and facetectomy was performed bilaterally with removal of superior articular process of S 1 and wide decompression of both L 5 nerve roots.  A generous foraminotomy was performed to decompress both  L 5 and S 1 nerves. Ligamentum flavum was detached and removed in a piecemeal fashion where it was causing nerve root compression. Angled curettes were used to detatch and then remove thickened compressive ligamentous material.  At this point it was felt that all neural elements were well decompressed. The wound was then irrigated with saline. Hemostasis was assured with bipolar electrocautery and Surgifoam and the interspace was irrigated with Depo-Medrol and fentanyl. The lumbodorsal fascia was closed with 0 Vicryl sutures the subcutaneous tissues reapproximated 2-0 Vicryl inverted sutures and the skin edges were reapproximated with 3-0 Vicryl subcuticular stitch. The wound is dressed with Dermabond and an occlusive dressing. Patient was extubated in the operating room and taken to recovery in stable and satisfactory condition having tolerated his operation well counts were correct at the end of the case.    PLAN OF CARE: Admit for overnight observation  PATIENT DISPOSITION:  PACU - hemodynamically stable.   Delay start of Pharmacological VTE agent (>24hrs) due to surgical blood loss or risk of bleeding: yes

## 2019-09-08 ENCOUNTER — Encounter (HOSPITAL_COMMUNITY): Payer: Self-pay | Admitting: Neurosurgery

## 2019-09-08 DIAGNOSIS — M48062 Spinal stenosis, lumbar region with neurogenic claudication: Secondary | ICD-10-CM | POA: Diagnosis not present

## 2019-09-08 LAB — GLUCOSE, CAPILLARY
Glucose-Capillary: 138 mg/dL — ABNORMAL HIGH (ref 70–99)
Glucose-Capillary: 96 mg/dL (ref 70–99)

## 2019-09-08 NOTE — Discharge Instructions (Signed)
Wound Care Leave incision open to air. You may shower. Do not scrub directly on incision.  Do not put any creams, lotions, or ointments on incision. Activity Walk each and every day, increasing distance each day. No lifting greater than 5 lbs.  Avoid bending, arching, and twisting. No driving for 2 weeks; may ride as a passenger locally.  Diet Resume your normal diet.  Return to Work Will be discussed at you follow up appointment. Call Your Doctor If Any of These Occur Redness, drainage, or swelling at the wound.  Temperature greater than 101 degrees. Severe pain not relieved by pain medication. Incision starts to come apart. Follow Up Appt Call today for appointment in 2-3 weeks ([phone]) or for problems.  If you have any hardware placed in your spine, you will need an x-ray before your appointment. 

## 2019-09-08 NOTE — Discharge Summary (Signed)
Physician Discharge Summary  Patient ID: AZARIAN STARACE MRN: [id number] DOB/AGE: [birth year] 79 y.o.  Admit date: 09/07/2019 Discharge date: 09/08/2019  Admission Diagnoses: Foraminal stenosis of lumbosacral region, degenerative lumbar stenosis, radiculopathy, lumbago, diabetic peripheral neuropathy    Discharge Diagnoses: Foraminal stenosis of lumbosacral region, degenerative lumbar stenosis, radiculopathy, lumbago, diabetic peripheral neuropathy s/p Bilateral Lumbar Five - Sacral One Foraminotomy (Bilateral) - posterior      Active Problems:   Degenerative lumbar spinal stenosis   Discharged Condition: good  Hospital Course: Harry Zamora was admitted for surgery with dx foraminal stenosis and radiculopathy. Following uncomplicated surgery (above) he recovered nicely and transferred to [facility] for nursing care and therapies. He is mobilizing well.  Consults: None  Significant Diagnostic Studies: radiology: X-Ray: intra-op  Treatments: surgery: Bilateral Lumbar Five - Sacral One Foraminotomy (Bilateral) - posterior     Discharge Exam: Blood pressure 116/66, pulse 72, temperature 98 F (36.7 C), resp. rate 18, height 6\' 2"  (1.88 m), weight 73.2 kg, SpO2 100 %. Alert, conversant, sitting in chair. Reports no leg pain and mild lumbar soreness. Denies numbness, tingling. Ambulated with PT. Good strength BLE. Incision without erythema, swelling or drainage beneath honeycomb and Dermabond.    Disposition: Discharge to home.  Oxycodone 5mg  and Robaxin 500mg  will be eRx'ed for prn home use. Pt verbalizes understanding of d/c instructions. Office f/u has been scheduled.   Discharge Instructions     Remove dressing in 72 hours   Complete by: As directed    Diet - low sodium heart healthy   Complete by: As directed    Incentive spirometry RT   Complete by: As directed    Increase activity slowly   Complete by: As directed      Allergies as of 09/08/2019       Reactions   Levaquin [levofloxacin In D5w] Rash   Niaspan [niacin Er] Rash, Other (See Comments)   Flushing reaction   Tramadol Rash      Medication List    TAKE these medications   docusate sodium 100 MG capsule Commonly known as: COLACE Take 1 capsule (100 mg total) by mouth 2 (two) times daily. What changed: when to take this   Farxiga 10 MG Tabs tablet Generic drug: dapagliflozin propanediol Take 10 mg by mouth daily.   HYDROcodone-acetaminophen 7.5-325 MG tablet Commonly known as: NORCO Take 1-2 tablets by mouth every 4 (four) hours as needed for moderate pain.   lovastatin 40 MG tablet Commonly known as: MEVACOR Take 40 mg by mouth at bedtime.   meloxicam 15 MG tablet Commonly known as: MOBIC Take 15 mg by mouth daily.   metFORMIN 500 MG tablet Commonly known as: GLUCOPHAGE Take 1,000 mg by mouth 2 (two) times daily with a meal.   methocarbamol 500 MG tablet Commonly known as: ROBAXIN Take 1 tablet (500 mg total) by mouth every 6 (six) hours as needed for muscle spasms.   multivitamin with minerals Tabs tablet Take 1 tablet by mouth daily.   oxyCODONE-acetaminophen 10-325 MG tablet Commonly known as: PERCOCET Take 1 tablet by mouth every 6 (six) hours as needed for pain.   pregabalin 75 MG capsule Commonly known as: LYRICA Take 75 mg by mouth 3 (three) times daily.   rOPINIRole 4 MG tablet Commonly known as: REQUIP Take 4 mg by mouth at bedtime.   Trulicity 1.5 IO/2.7OJ Sopn Generic drug: Dulaglutide Inject 1.5 mg into the skin every Wednesday.   vitamin B-12 500 MCG tablet Commonly known as: CYANOCOBALAMIN Take  500 mcg by mouth daily.       Follow-up Information    Erline Levine, MD Follow up.   Specialty: Neurosurgery Contact information: [address] [place] [address] [phone]               Signed: Peggyann Shoals, MD 09/08/2019, 12:57 PM

## 2019-09-08 NOTE — Progress Notes (Addendum)
Subjective: Patient reports "I'm doing great! My legs don't hurt. My back is just a little sore"  Objective: Vital signs in last 24 hours: Temp:  [97.3 F (36.3 C)-99.3 F (37.4 C)] 98 F (36.7 C) (06/25 0725) Pulse Rate:  [64-90] 72 (06/25 0725) Resp:  [9-20] 18 (06/25 0725) BP: (116-175)/(61-106) 116/66 (06/25 0725) SpO2:  [97 %-100 %] 100 % (06/25 0725)  Intake/Output from previous day: 06/24 0701 - 06/25 0700 In: 1500 [I.V.:1400; IV Piggyback:100] Out: 75 [Blood:75] Intake/Output this shift: No intake/output data recorded.  Alert, conversant, sitting in chair. Reports no leg pain and mild lumbar soreness. Denies numbness, tingling. Ambulated with PT. Good strength BLE. Incision without erythema, swelling or drainage beneath honeycomb and Dermabond.   Lab Results: Recent Labs    09/05/19 1144 09/07/19 0632  WBC 5.6  --   HGB 12.9* 12.2*  HCT 41.7 36.0*  PLT 256  --    BMET Recent Labs    09/05/19 1144 09/07/19 0632  NA 139 140  K 6.1* 4.5  CL 102 101  CO2 27  --   GLUCOSE 114* 107*  BUN 34* 29*  CREATININE 1.38* 1.30*  CALCIUM 9.6  --     Studies/Results: DG Lumbar Spine 2-3 Views  Result Date: 09/07/2019 CLINICAL DATA:  Lumbar surgery. EXAM: LUMBAR SPINE - 2-3 VIEW COMPARISON:  MRI 08/24/2019. FINDINGS: L3 through S1 posterior and interbody fusion. No acute bony abnormality identified. No evidence of fracture. Aortoiliac atherosclerotic vascular calcification. IMPRESSION: L3 through S1 posterior interbody fusion. Hardware intact. Anatomic alignment. Electronically Signed   By: Marcello Moores  Register   On: 09/07/2019 10:31    Assessment/Plan: improved  LOS: 0 days  Ok to d/c to home per DrStern. Oxycodone 5mg  and Robaxin 500mg  will be eRx'ed for prn home use. Pt verbalizes understanding of d/c instructions. Office f/u has been scheduled.   Verdis Prime 09/08/2019, 8:33 AM  Leg pain and weakness resolved

## 2019-09-08 NOTE — Plan of Care (Signed)
MET 

## 2019-09-08 NOTE — Evaluation (Signed)
Occupational Therapy Evaluation Patient Details Name: BRANDONLEE NAVIS MRN: [id number] DOB: 1940/12/13 Today's Date: 09/08/2019    History of Present Illness Pt is an 79 y/o male s/p L5-S1 foraminotomy. PMH includes HTN, PVD, and DM.    Clinical Impression   Patient currently presents at baseline level of function demonstrating Mod I grossly for all BADLs, functional transfers, and mobility without assistive device. Patient able to attain and maintain figure-4 position to doff/don LB clothing and footwear. Patient also able to complete toilet transfers with Mod I and use of grab bar. Patient able to recall 3/3 back precautions without cueing this date and is able to adhere to them with occasional verbal cueing. Patient does not currently require continued acute OT services at this time with OT to sign off. No follow-up OT recommendations post d/c.     Follow Up Recommendations  No OT follow up    Equipment Recommendations  None recommended by OT;Other (comment) (Patient has all needed DME)    Recommendations for Other Services       Precautions / Restrictions Precautions Precautions: Back Precaution Booklet Issued: No Precaution Comments: Pt able to recall back precautions from previous surgery .       Mobility Bed Mobility               General bed mobility comments: Patient seated in recliner upon entry.  Transfers Overall transfer level: Modified independent Equipment used: None                  Balance Overall balance assessment: No apparent balance deficits (not formally assessed)                                         ADL either performed or assessed with clinical judgement   ADL Overall ADL's : Modified independent                                             Vision         Perception     Praxis      Pertinent Vitals/Pain Pain Assessment: No/denies pain     Hand Dominance     Extremity/Trunk Assessment  Upper Extremity Assessment Upper Extremity Assessment: Overall WFL for tasks assessed   Lower Extremity Assessment Lower Extremity Assessment: Defer to PT evaluation   Cervical / Trunk Assessment Cervical / Trunk Assessment: Other exceptions Cervical / Trunk Exceptions: s/p lumbar sx   Communication Communication Communication: No difficulties   Cognition Arousal/Alertness: Awake/alert Behavior During Therapy: WFL for tasks assessed/performed Overall Cognitive Status: Within Functional Limits for tasks assessed                                     General Comments  Patient able to return demonstrate LB dressing with compensatory technique without AE/DME. Patient also able to complete toilet transfer with adherence to back precautions and no cueing. Patient able to recall 3/3 back precautions independently.     Exercises     Shoulder Instructions      Home Living Family/patient expects to be discharged to:: Private residence Living Arrangements: Spouse/significant other Available Help at Discharge: Family;Available 24 hours/day Type of Home: [place]  Access: Stairs to enter [employer] of Steps: 1 Entrance Stairs-Rails: None Home Layout: One level     Bathroom Shower/Tub: Occupational psychologist: Standard     Home Equipment: Toilet riser;Shower seat;Walker - 2 wheels;Cane - single point          Prior Functioning/Environment Level of Independence: Independent with assistive device(s)        Comments: Was using cane for ambulation         OT Problem List:        OT Treatment/Interventions:      OT Goals(Current goals can be found in the care plan section) Acute Rehab OT Goals Patient Stated Goal: To return home OT Goal Formulation: All assessment and education complete, DC therapy  OT Frequency:     Barriers to D/C:            Co-evaluation              AM-PAC OT "6 Clicks" Daily Activity     Outcome  Measure Help from another person eating meals?: None Help from another person taking care of personal grooming?: None Help from another person toileting, which includes using toliet, bedpan, or urinal?: None Help from another person bathing (including washing, rinsing, drying)?: None Help from another person to put on and taking off regular upper body clothing?: None Help from another person to put on and taking off regular lower body clothing?: None 6 Click Score: 24   End of Session    Activity Tolerance: Patient tolerated treatment well Patient left: in chair;with call bell/phone within reach  OT Visit Diagnosis: Unsteadiness on feet (R26.81);Other abnormalities of gait and mobility (R26.89)                Time: 8502-7741 OT Time Calculation (min): 12 min Charges:  OT General Charges $OT Visit: 1 Visit OT Evaluation $OT Eval Low Complexity: 1 Low  Jaden Batchelder H. OTR/L Supplemental OT, Department of rehab services [phone]  Andrae Claunch R H. 09/08/2019, 10:50 AM

## 2019-09-28 ENCOUNTER — Other Ambulatory Visit: Payer: Medicare PPO

## 2020-04-01 DIAGNOSIS — E785 Hyperlipidemia, unspecified: Secondary | ICD-10-CM | POA: Diagnosis not present

## 2020-04-01 DIAGNOSIS — I739 Peripheral vascular disease, unspecified: Secondary | ICD-10-CM | POA: Diagnosis not present

## 2020-04-01 DIAGNOSIS — M5416 Radiculopathy, lumbar region: Secondary | ICD-10-CM | POA: Diagnosis not present

## 2020-04-01 DIAGNOSIS — I10 Essential (primary) hypertension: Secondary | ICD-10-CM | POA: Diagnosis not present

## 2020-04-01 DIAGNOSIS — E1140 Type 2 diabetes mellitus with diabetic neuropathy, unspecified: Secondary | ICD-10-CM | POA: Diagnosis not present

## 2020-04-01 DIAGNOSIS — K760 Fatty (change of) liver, not elsewhere classified: Secondary | ICD-10-CM | POA: Diagnosis not present

## 2020-04-01 DIAGNOSIS — I700 Atherosclerosis of aorta: Secondary | ICD-10-CM | POA: Diagnosis not present

## 2020-04-01 DIAGNOSIS — N1831 Chronic kidney disease, stage 3a: Secondary | ICD-10-CM | POA: Diagnosis not present

## 2020-04-01 DIAGNOSIS — F329 Major depressive disorder, single episode, unspecified: Secondary | ICD-10-CM | POA: Diagnosis not present

## 2020-04-02 DIAGNOSIS — H52203 Unspecified astigmatism, bilateral: Secondary | ICD-10-CM | POA: Diagnosis not present

## 2020-04-02 DIAGNOSIS — E113291 Type 2 diabetes mellitus with mild nonproliferative diabetic retinopathy without macular edema, right eye: Secondary | ICD-10-CM | POA: Diagnosis not present

## 2020-04-02 DIAGNOSIS — Z961 Presence of intraocular lens: Secondary | ICD-10-CM | POA: Diagnosis not present

## 2020-05-14 DIAGNOSIS — H5203 Hypermetropia, bilateral: Secondary | ICD-10-CM | POA: Diagnosis not present

## 2020-05-14 DIAGNOSIS — H524 Presbyopia: Secondary | ICD-10-CM | POA: Diagnosis not present

## 2020-05-14 DIAGNOSIS — H52209 Unspecified astigmatism, unspecified eye: Secondary | ICD-10-CM | POA: Diagnosis not present

## 2020-06-10 DIAGNOSIS — M5416 Radiculopathy, lumbar region: Secondary | ICD-10-CM | POA: Diagnosis not present

## 2020-06-10 DIAGNOSIS — S32009K Unspecified fracture of unspecified lumbar vertebra, subsequent encounter for fracture with nonunion: Secondary | ICD-10-CM | POA: Diagnosis not present

## 2020-06-10 DIAGNOSIS — M545 Low back pain, unspecified: Secondary | ICD-10-CM | POA: Diagnosis not present

## 2020-06-20 ENCOUNTER — Other Ambulatory Visit: Payer: Self-pay | Admitting: Neurosurgery

## 2020-06-20 DIAGNOSIS — M5416 Radiculopathy, lumbar region: Secondary | ICD-10-CM

## 2020-06-20 NOTE — Progress Notes (Signed)
Phone call to patient to verify medication list and allergies for myelogram procedure. Medications pt is currently taking are safe to continue to take. Advised pt if any new medications are started prior to procedure to call and make [place] aware. Pt also instructed to have a driver the day of the procedure, he would be with [place] around 2 hours, and discharge instructions reviewed. Pt verbalized understanding.

## 2020-06-26 ENCOUNTER — Ambulatory Visit
Admission: RE | Admit: 2020-06-26 | Discharge: 2020-06-26 | Disposition: A | Discharge status: Home / Self Care | Payer: Medicare Other | Source: Ambulatory Visit | Attending: Neurosurgery | Admitting: Neurosurgery

## 2020-06-26 VITALS — BP 154/84 | HR 62

## 2020-06-26 DIAGNOSIS — M5416 Radiculopathy, lumbar region: Secondary | ICD-10-CM

## 2020-06-26 DIAGNOSIS — M48061 Spinal stenosis, lumbar region without neurogenic claudication: Secondary | ICD-10-CM

## 2020-06-26 DIAGNOSIS — M4316 Spondylolisthesis, lumbar region: Secondary | ICD-10-CM

## 2020-06-26 DIAGNOSIS — M48062 Spinal stenosis, lumbar region with neurogenic claudication: Secondary | ICD-10-CM

## 2020-06-26 IMAGING — CT CT L SPINE W/ CM
1 of 7 series · 6 of 14 positions shown, 8 images · non-contrast
Comparison: Lumbar spine x-rays dated [DATE]. MRI lumbar
spine dated [DATE].

CLINICAL DATA: Chronic low back, bilateral buttock, and anterior
thigh pain, not significantly improved after L3-S1 fusion BUSKA
BUSKA.
TECHNIQUE: Contiguous axial images were obtained through the lumbar spine after
the intrathecal infusion of contrast. Coronal and sagittal
reconstructions were obtained of the axial image sets.

[Series 3: l spine soft (person_name) · axial · 0.33mm/px · z∈[-430,-240]mm · 6 of 133 slices shown, 8 images]
[im 19/133  soft-tissue]
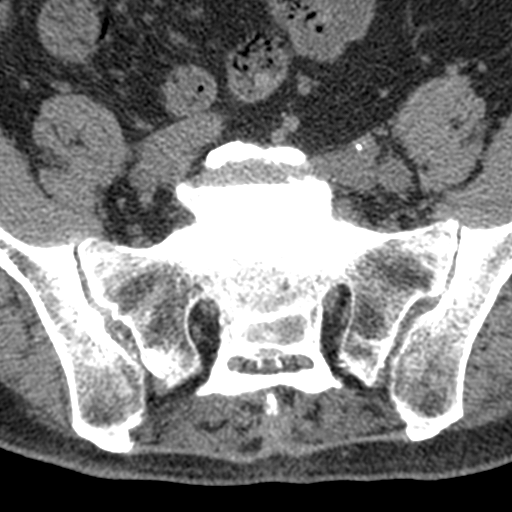
[im 19/133  bone]
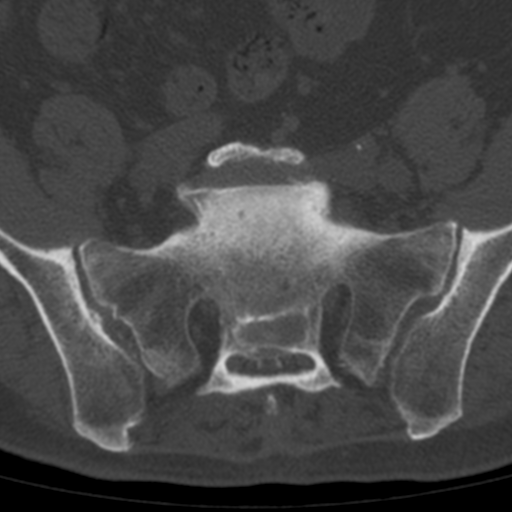
[im 38/133  bone]
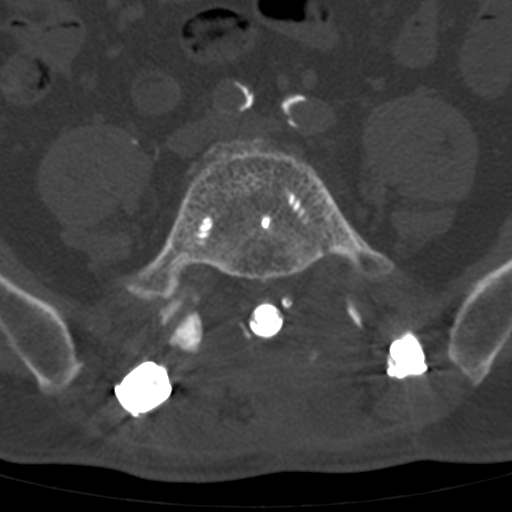
[im 57/133  bone]
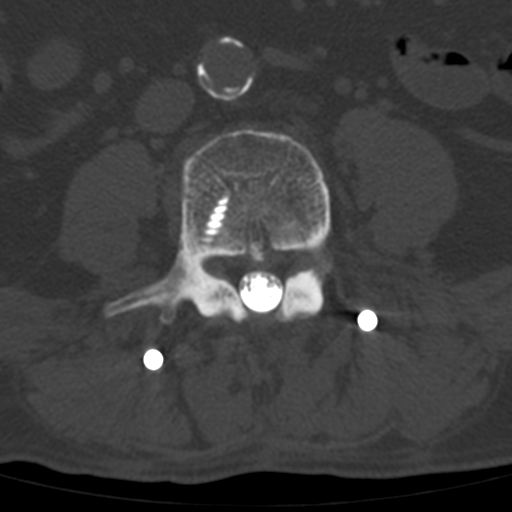
[im 76/133  bone]
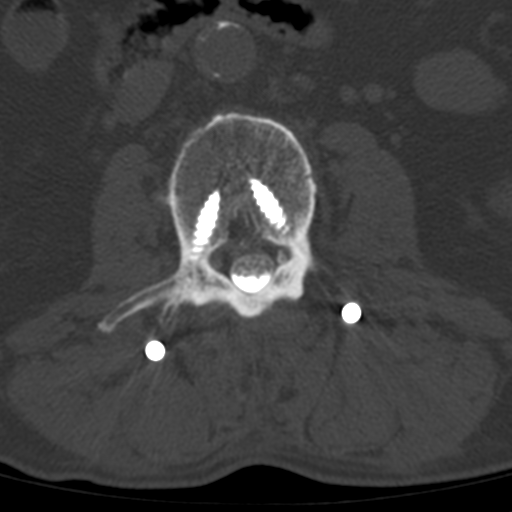
[im 95/133  soft-tissue]
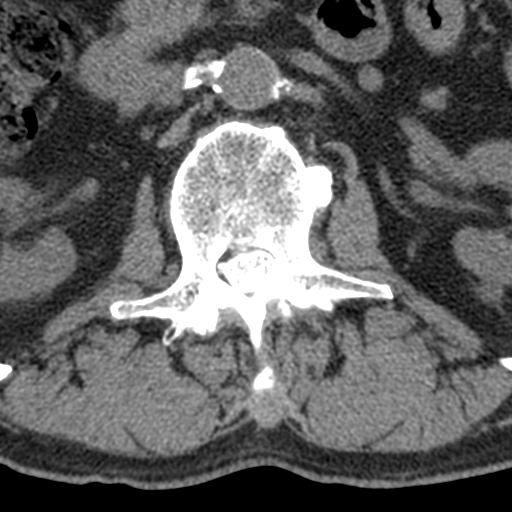
[im 95/133  bone]
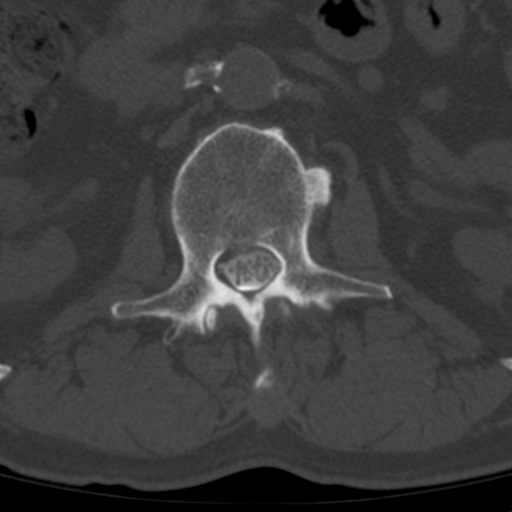
[im 114/133  bone]
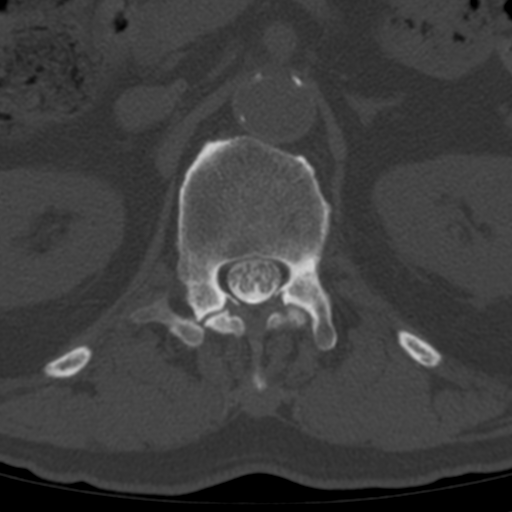

[6 of 14 positions shown; findings below may reference images not displayed]

EXAM:
LUMBAR MYELOGRAM

CT LUMBAR MYELOGRAM

FLUOROSCOPY TIME:  Radiation Exposure Index (as provided by the
fluoroscopic device): 19.2 mGy

Fluoroscopy Time:  57 seconds

Number of Acquired Images:  20

PROCEDURE:
After thorough discussion of risks and benefits of the procedure
including bleeding, infection, injury to nerves, blood vessels,
adjacent structures as well as headache and CSF leak, written and
oral informed consent was obtained. Consent was obtained by Dr.
BUSKA. Time out form was completed.

Patient was positioned prone on the fluoroscopy table. Local
anesthesia was provided with 1% lidocaine without epinephrine after
prepped and draped in the usual sterile fashion. Puncture was
performed at L1-L2 using a 3 inch 22-gauge spinal needle via right
interlaminar approach. Using a single pass through the dura, the
needle was placed within the thecal sac, with return of clear CSF.
15 mL of Isovue [I6] was injected into the thecal sac, with normal
opacification of the nerve roots and cauda equina consistent with
free flow within the subarachnoid space.

I personally performed the lumbar puncture and administered the
intrathecal contrast. I also personally supervised acquisition of
the myelogram images.
FINDINGS: LUMBAR MYELOGRAM FINDINGS:

Prior L3-S1 PLIF and L5-S1 ALIF. Unchanged trace anterolisthesis at
L3-L4. No dynamic instability. Small ventral extradural defect at
L2-L3 with new moderate spinal canal stenosis. No nerve root
effacement.

CT LUMBAR MYELOGRAM FINDINGS:

Segmentation: Standard.

Alignment: Unchanged trace anterolisthesis at L3-L4 and trace
retrolisthesis at L5-S1.

Vertebrae: Prior L3-S1 PLIF and L5-S1 ALIF. There is a fracture of
the left S1 anterior screw and lucency surrounding the right S1
anterior screw. Mild lucency around the right S1 posterior pedicle
screw which traverses the right L5-S1 facet joint. No significant
interbody fusion at any level. Progressive S1 superior endplate
sclerosis.

Conus medullaris and cauda equina: Conus extends to the L1 level.
Conus and cauda equina appear normal.

Paraspinal and other soft tissues: Small nonobstructive right renal
calculus. Prior cholecystectomy. Aortoiliac atherosclerotic vascular
disease. Paraspinous postsurgical changes from L3-S1 with small
seroma.

Disc levels:

T12-L1:  Negative.

L1-L2: Unchanged minimal disc bulging with superimposed small right
foraminal disc protrusion. Unchanged mild right facet arthropathy.
No stenosis.

L2-L3: Progressive mild disc bulging and moderate posterior element
hypertrophy with new moderate spinal canal and right neuroforaminal
stenosis. No left neuroforaminal stenosis.

L3-L4: Prior posterior decompression and PLIF. Unchanged mild
bilateral facet arthropathy. No residual stenosis.

L4-L5: Prior posterior decompression and PLIF. Unchanged mild
bilateral facet arthropathy. Residual mild bilateral neuroforaminal
stenosis. No spinal canal stenosis.

L5-S1: Prior posterior decompression, PLIF, and ALIF. Asymmetric
underfilling of the right S1 nerve root with suspected scar tissue
surrounding the nerve root in the lateral recess (series 3, image
100). Residual mild disc bulging and endplate spurring. Residual
moderate right neuroforaminal stenosis. No spinal canal or left
neuroforaminal stenosis.
IMPRESSION: 1. Prior L3-S1 PLIF with fracture of the left S1 anterior screw and
loosening of the right S1 anterior and posterior screws. The right
posterior screw traverses the right L5-S1 facet joint. No
significant interbody fusion at any level.
2. Residual moderate right neuroforaminal stenosis at L5-S1.
Asymmetric underfilling of the right S1 nerve root with suspected
scar tissue surrounding the nerve root in the lateral recess
3. Progressive adjacent segment disease at L2-L3 with new moderate
spinal canal and right neuroforaminal stenosis.
4. Nonobstructive right nephrolithiasis.
5. Aortic Atherosclerosis ([I6]-[I6]).

## 2020-06-26 IMAGING — XA DG MYELOGRAPHY LUMBAR INJ LUMBOSACRAL
12 of 21 series · 12 of 21 positions shown · non-contrast
Comparison: Lumbar spine x-rays dated [DATE]. MRI lumbar
spine dated [DATE].

CLINICAL DATA: Chronic low back, bilateral buttock, and anterior
thigh pain, not significantly improved after L3-S1 fusion BUSKA
BUSKA.
TECHNIQUE: Contiguous axial images were obtained through the lumbar spine after
the intrathecal infusion of contrast. Coronal and sagittal
reconstructions were obtained of the axial image sets.

[Series 1: vasc adipose · 1 of 1 slices shown (1 of 11)]
[im 1/1]
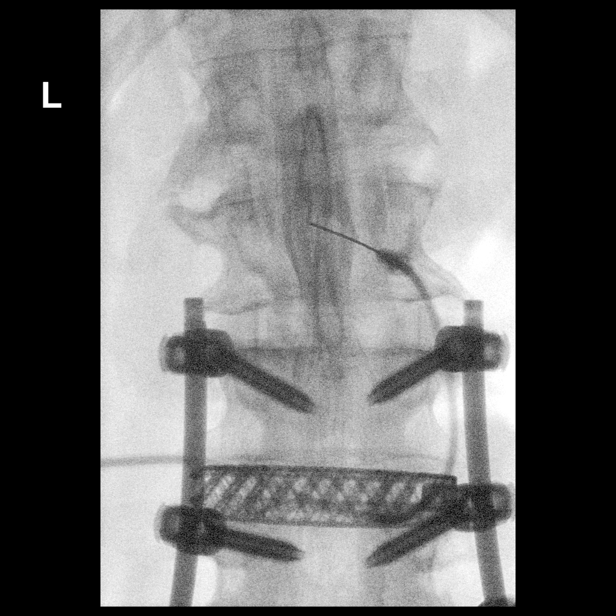

[Series 2: w lumbar spine flexion · 0.15mm/px · 1 of 1 slices shown]
[im 1/1]
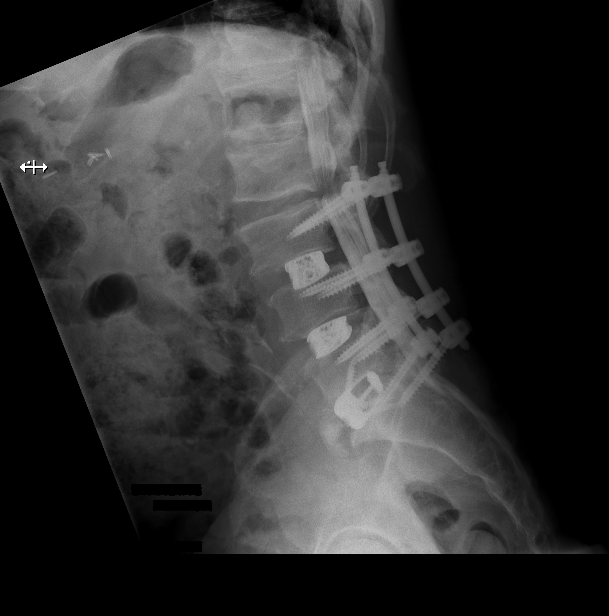

[Series 3: vasc adipose · 1 of 1 slices shown (2 of 11)]
[im 1/1]
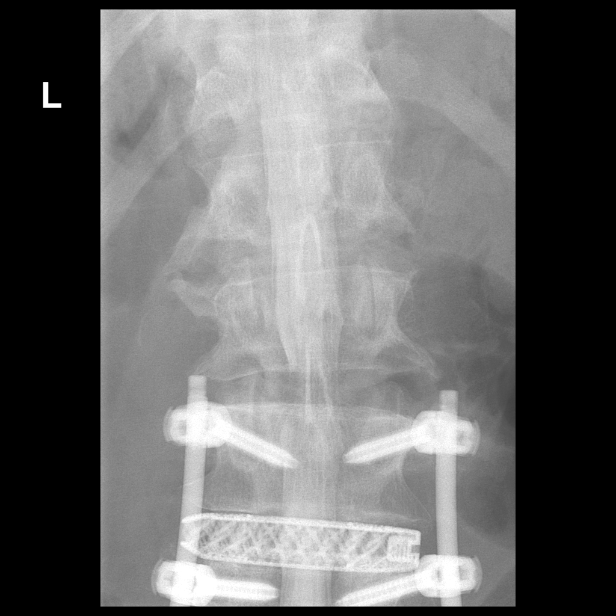

[Series 4: vasc adipose · 1 of 1 slices shown (3 of 11)]
[im 1/1]
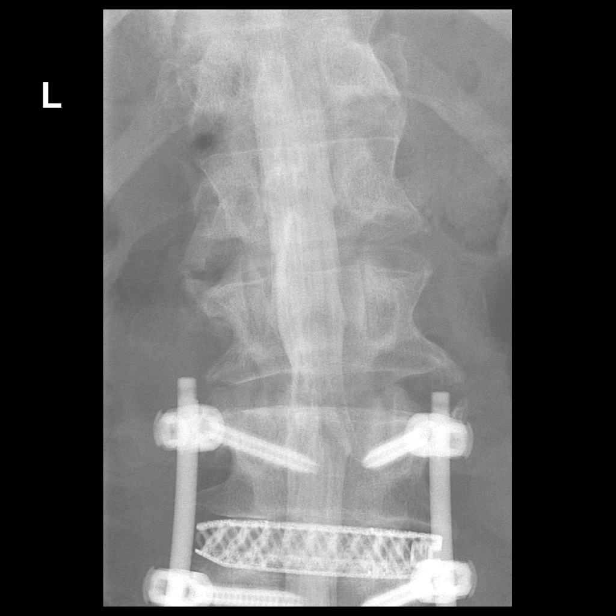

[Series 5: vasc adipose · 1 of 1 slices shown (4 of 11)]
[im 1/1]
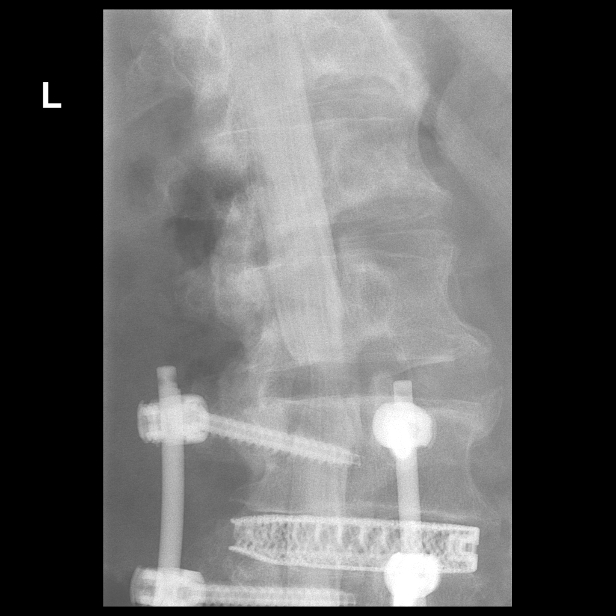

[Series 7: vasc adipose · 1 of 1 slices shown (5 of 11)]
[im 1/1]
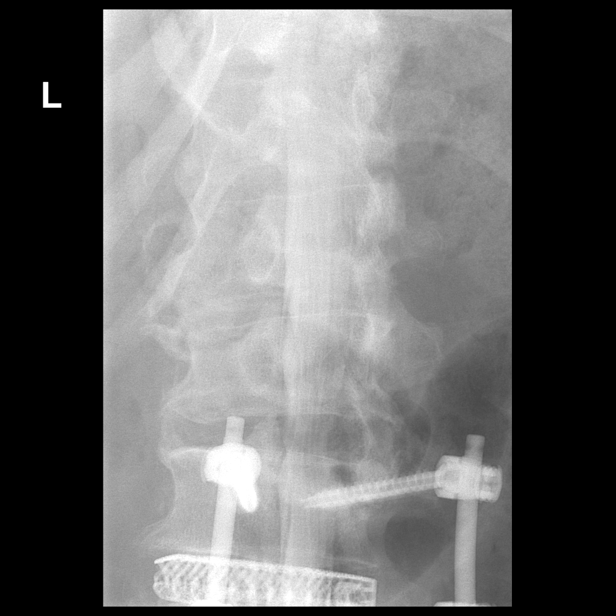

[Series 9: vasc adipose · 1 of 1 slices shown (6 of 11)]
[im 1/1]
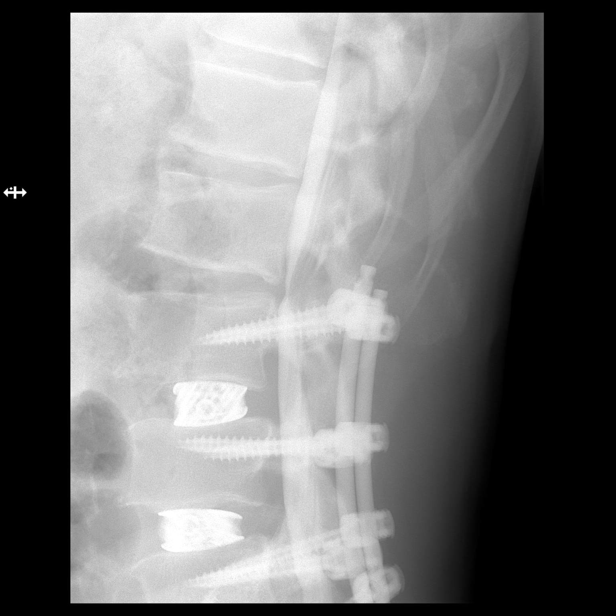

[Series 11: vasc adipose · 1 of 1 slices shown (7 of 11)]
[im 1/1]
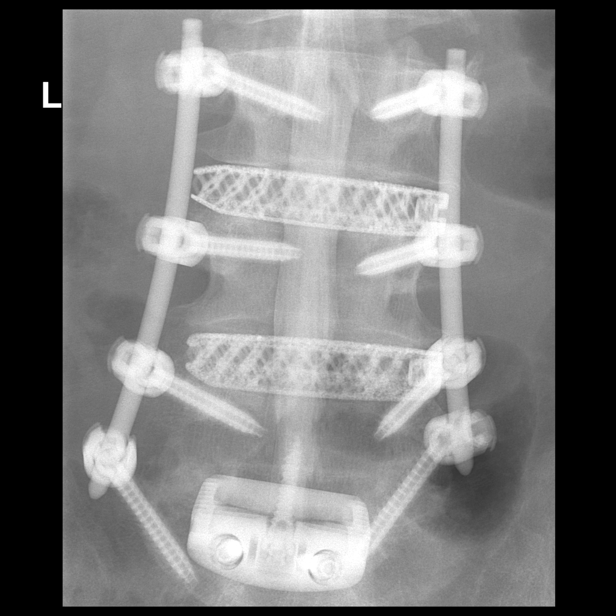

[Series 12: vasc adipose · 1 of 1 slices shown (8 of 11)]
[im 1/1]
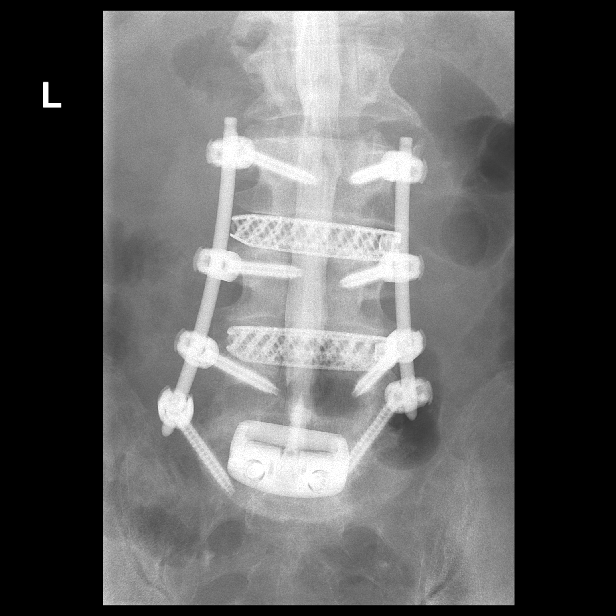

[Series 14: vasc adipose · 1 of 1 slices shown (9 of 11)]
[im 1/1]
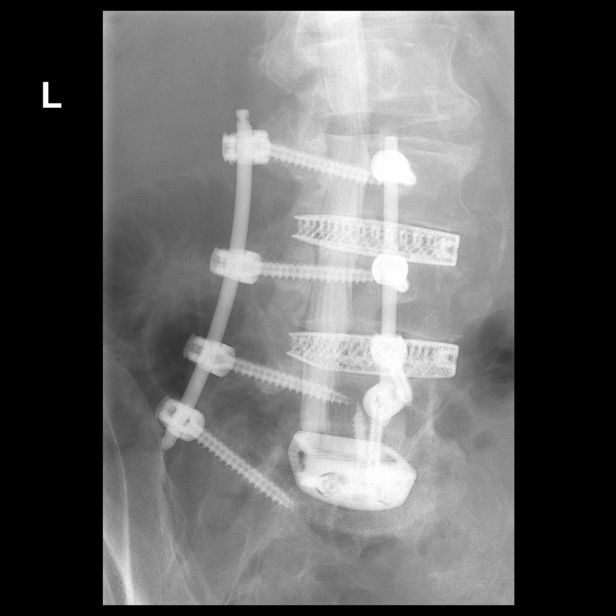

[Series 16: vasc adipose · 1 of 1 slices shown (10 of 11)]
[im 1/1]
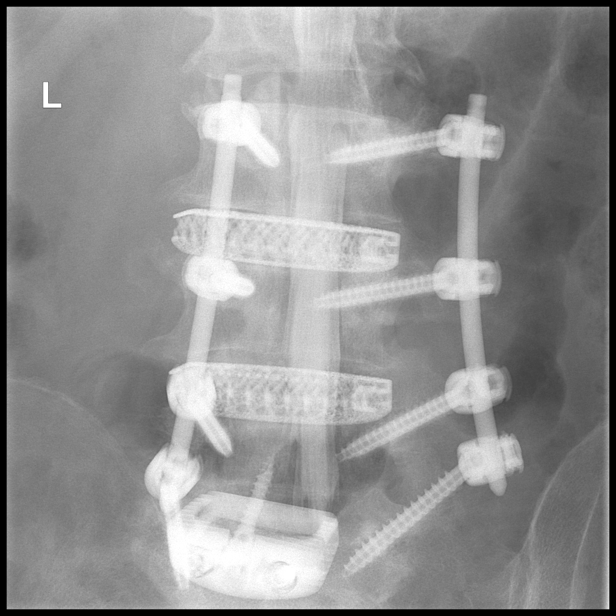

[Series 18: vasc adipose · 1 of 1 slices shown (11 of 11)]
[im 1/1]
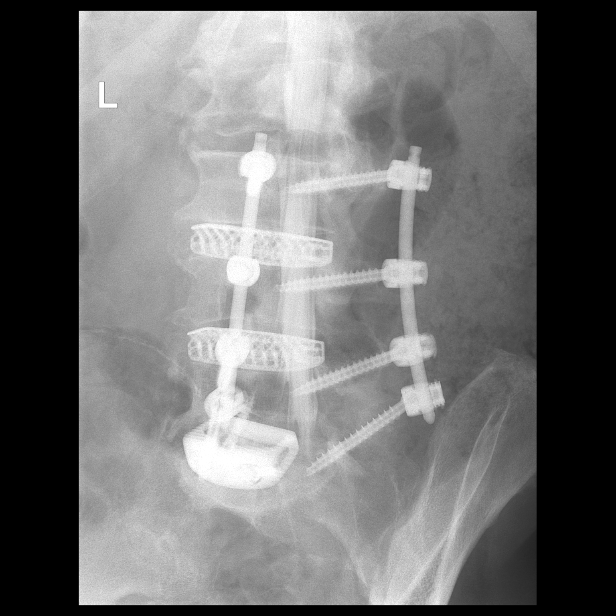

[12 of 21 positions shown; findings below may reference images not displayed]

EXAM:
LUMBAR MYELOGRAM

CT LUMBAR MYELOGRAM

FLUOROSCOPY TIME:  Radiation Exposure Index (as provided by the
fluoroscopic device): 19.2 mGy

Fluoroscopy Time:  57 seconds

Number of Acquired Images:  20

PROCEDURE:
After thorough discussion of risks and benefits of the procedure
including bleeding, infection, injury to nerves, blood vessels,
adjacent structures as well as headache and CSF leak, written and
oral informed consent was obtained. Consent was obtained by Dr.
BUSKA. Time out form was completed.

Patient was positioned prone on the fluoroscopy table. Local
anesthesia was provided with 1% lidocaine without epinephrine after
prepped and draped in the usual sterile fashion. Puncture was
performed at L1-L2 using a 3 inch 22-gauge spinal needle via right
interlaminar approach. Using a single pass through the dura, the
needle was placed within the thecal sac, with return of clear CSF.
15 mL of Isovue [I6] was injected into the thecal sac, with normal
opacification of the nerve roots and cauda equina consistent with
free flow within the subarachnoid space.

I personally performed the lumbar puncture and administered the
intrathecal contrast. I also personally supervised acquisition of
the myelogram images.
FINDINGS: LUMBAR MYELOGRAM FINDINGS:

Prior L3-S1 PLIF and L5-S1 ALIF. Unchanged trace anterolisthesis at
L3-L4. No dynamic instability. Small ventral extradural defect at
L2-L3 with new moderate spinal canal stenosis. No nerve root
effacement.

CT LUMBAR MYELOGRAM FINDINGS:

Segmentation: Standard.

Alignment: Unchanged trace anterolisthesis at L3-L4 and trace
retrolisthesis at L5-S1.

Vertebrae: Prior L3-S1 PLIF and L5-S1 ALIF. There is a fracture of
the left S1 anterior screw and lucency surrounding the right S1
anterior screw. Mild lucency around the right S1 posterior pedicle
screw which traverses the right L5-S1 facet joint. No significant
interbody fusion at any level. Progressive S1 superior endplate
sclerosis.

Conus medullaris and cauda equina: Conus extends to the L1 level.
Conus and cauda equina appear normal.

Paraspinal and other soft tissues: Small nonobstructive right renal
calculus. Prior cholecystectomy. Aortoiliac atherosclerotic vascular
disease. Paraspinous postsurgical changes from L3-S1 with small
seroma.

Disc levels:

T12-L1:  Negative.

L1-L2: Unchanged minimal disc bulging with superimposed small right
foraminal disc protrusion. Unchanged mild right facet arthropathy.
No stenosis.

L2-L3: Progressive mild disc bulging and moderate posterior element
hypertrophy with new moderate spinal canal and right neuroforaminal
stenosis. No left neuroforaminal stenosis.

L3-L4: Prior posterior decompression and PLIF. Unchanged mild
bilateral facet arthropathy. No residual stenosis.

L4-L5: Prior posterior decompression and PLIF. Unchanged mild
bilateral facet arthropathy. Residual mild bilateral neuroforaminal
stenosis. No spinal canal stenosis.

L5-S1: Prior posterior decompression, PLIF, and ALIF. Asymmetric
underfilling of the right S1 nerve root with suspected scar tissue
surrounding the nerve root in the lateral recess (series 3, image
100). Residual mild disc bulging and endplate spurring. Residual
moderate right neuroforaminal stenosis. No spinal canal or left
neuroforaminal stenosis.
IMPRESSION: 1. Prior L3-S1 PLIF with fracture of the left S1 anterior screw and
loosening of the right S1 anterior and posterior screws. The right
posterior screw traverses the right L5-S1 facet joint. No
significant interbody fusion at any level.
2. Residual moderate right neuroforaminal stenosis at L5-S1.
Asymmetric underfilling of the right S1 nerve root with suspected
scar tissue surrounding the nerve root in the lateral recess
3. Progressive adjacent segment disease at L2-L3 with new moderate
spinal canal and right neuroforaminal stenosis.
4. Nonobstructive right nephrolithiasis.
5. Aortic Atherosclerosis ([I6]-[I6]).

## 2020-06-26 MED ORDER — DIAZEPAM 5 MG PO TABS
5.0000 mg | ORAL_TABLET | Freq: Once | ORAL | Status: DC
Start: 2020-06-26 — End: 2020-06-27

## 2020-06-26 MED ORDER — IOPAMIDOL (ISOVUE-M 200) INJECTION 41%: 18.0000 mL | Freq: Once | INTRAMUSCULAR | Status: DC

## 2020-06-26 NOTE — Discharge Instructions (Signed)
Myelogram Discharge Instructions  1. Go home and rest quietly for the next 24 hours.  It is important to lie flat for the next 24 hours.  Get up only to go to the restroom.  You may lie in the bed or on a couch on your back, your stomach, your left side or your right side.  You may have one pillow under your head.  You may have pillows between your knees while you are on your side or under your knees while you are on your back.  2. DO NOT drive today.  Recline the seat as far back as it will go, while still wearing your seat belt, on the way home.  3. You may get up to go to the bathroom as needed.  You may sit up for 15-20 minutes to eat.  You may resume your normal diet and medications unless otherwise indicated.  Drink plenty of extra fluids today and tomorrow.  4. The incidence of a spinal headache with nausea and/or vomiting is about 5% (one in 20 patients).  If you develop a headache, lie flat and drink plenty of fluids until the headache goes away.  Caffeinated beverages may be helpful.  If you develop severe nausea and vomiting or a headache that does not go away with flat bed rest, call [phone].  5. You may resume normal activities after your 24 hours of bed rest is over; however, do not exert yourself strongly or do any heavy lifting tomorrow.  6. Call your physician for a follow-up appointment.

## 2020-07-09 ENCOUNTER — Other Ambulatory Visit: Payer: Self-pay | Admitting: Neurosurgery

## 2020-07-19 ENCOUNTER — Emergency Department (HOSPITAL_COMMUNITY): Payer: Medicare Other

## 2020-07-19 ENCOUNTER — Other Ambulatory Visit: Payer: Self-pay

## 2020-07-19 ENCOUNTER — Encounter (HOSPITAL_COMMUNITY): Payer: Self-pay | Admitting: Emergency Medicine

## 2020-07-19 ENCOUNTER — Emergency Department (HOSPITAL_COMMUNITY)
Admission: EM | Admit: 2020-07-19 | Discharge: 2020-07-19 | Disposition: A | Discharge status: Home / Self Care | Payer: Medicare Other | Attending: Student | Admitting: Student

## 2020-07-19 DIAGNOSIS — R7309 Other abnormal glucose: Secondary | ICD-10-CM | POA: Diagnosis not present

## 2020-07-19 DIAGNOSIS — Y9289 Other specified places as the place of occurrence of the external cause: Secondary | ICD-10-CM | POA: Diagnosis not present

## 2020-07-19 DIAGNOSIS — R519 Headache, unspecified: Secondary | ICD-10-CM | POA: Diagnosis present

## 2020-07-19 DIAGNOSIS — W1809XA Striking against other object with subsequent fall, initial encounter: Secondary | ICD-10-CM | POA: Diagnosis not present

## 2020-07-19 DIAGNOSIS — R55 Syncope and collapse: Secondary | ICD-10-CM | POA: Insufficient documentation

## 2020-07-19 DIAGNOSIS — Z5321 Procedure and treatment not carried out due to patient leaving prior to being seen by health care provider: Secondary | ICD-10-CM | POA: Insufficient documentation

## 2020-07-19 LAB — CBC WITH DIFFERENTIAL/PLATELET
Abs Immature Granulocytes: 0.01 10*3/uL (ref 0.00–0.07)
Basophils Absolute: 0 10*3/uL (ref 0.0–0.1)
Basophils Relative: 0 %
Eosinophils Absolute: 0.3 10*3/uL (ref 0.0–0.5)
Eosinophils Relative: 6 %
HCT: 42.9 % (ref 39.0–52.0)
Hemoglobin: 13.5 g/dL (ref 13.0–17.0)
Immature Granulocytes: 0 %
Lymphocytes Relative: 30 %
Lymphs Abs: 1.8 10*3/uL (ref 0.7–4.0)
MCH: 30.7 pg (ref 26.0–34.0)
MCHC: 31.5 g/dL (ref 30.0–36.0)
MCV: 97.5 fL (ref 80.0–100.0)
Monocytes Absolute: 0.7 10*3/uL (ref 0.1–1.0)
Monocytes Relative: 12 %
Neutro Abs: 3.2 10*3/uL (ref 1.7–7.7)
Neutrophils Relative %: 52 %
Platelets: 183 10*3/uL (ref 150–400)
RBC: 4.4 MIL/uL (ref 4.22–5.81)
RDW: 13.4 % (ref 11.5–15.5)
WBC: 6.1 10*3/uL (ref 4.0–10.5)
nRBC: 0 % (ref 0.0–0.2)

## 2020-07-19 LAB — URINALYSIS, ROUTINE W REFLEX MICROSCOPIC
Bacteria, UA: NONE SEEN
Bilirubin Urine: NEGATIVE
Glucose, UA: >=500 mg/dL — AB
Hgb urine dipstick: NEGATIVE
Ketones, ur: NEGATIVE mg/dL
Leukocytes,Ua: NEGATIVE
Nitrite: NEGATIVE
Protein, ur: NEGATIVE mg/dL
Specific Gravity, Urine: 1.021 (ref 1.005–1.030)
pH: 5 (ref 5.0–8.0)

## 2020-07-19 LAB — CBG MONITORING, ED: Glucose-Capillary: 141 mg/dL — ABNORMAL HIGH (ref 70–99)

## 2020-07-19 LAB — BASIC METABOLIC PANEL
Anion gap: 7 (ref 5–15)
BUN: 34 mg/dL — ABNORMAL HIGH (ref 8–23)
CO2: 24 mmol/L (ref 22–32)
Calcium: 9.3 mg/dL (ref 8.9–10.3)
Chloride: 105 mmol/L (ref 98–111)
Creatinine, Ser: 1.58 mg/dL — ABNORMAL HIGH (ref 0.61–1.24)
GFR, Estimated: 44 mL/min — ABNORMAL LOW (ref >60)
Glucose, Bld: 144 mg/dL — ABNORMAL HIGH (ref 70–99)
Potassium: 5.9 mmol/L — ABNORMAL HIGH (ref 3.5–5.1)
Sodium: 136 mmol/L (ref 135–145)

## 2020-07-19 LAB — TROPONIN I (HIGH SENSITIVITY): Troponin I (High Sensitivity): 5 ng/L (ref <18)

## 2020-07-19 IMAGING — CR DG CHEST 2V
2 series · 2 of 2 positions shown · non-contrast
Comparison: [DATE].

CLINICAL DATA: Syncope and fall.  Right elbow injury.

EXAM:
CHEST - 2 VIEW

[chest pa]
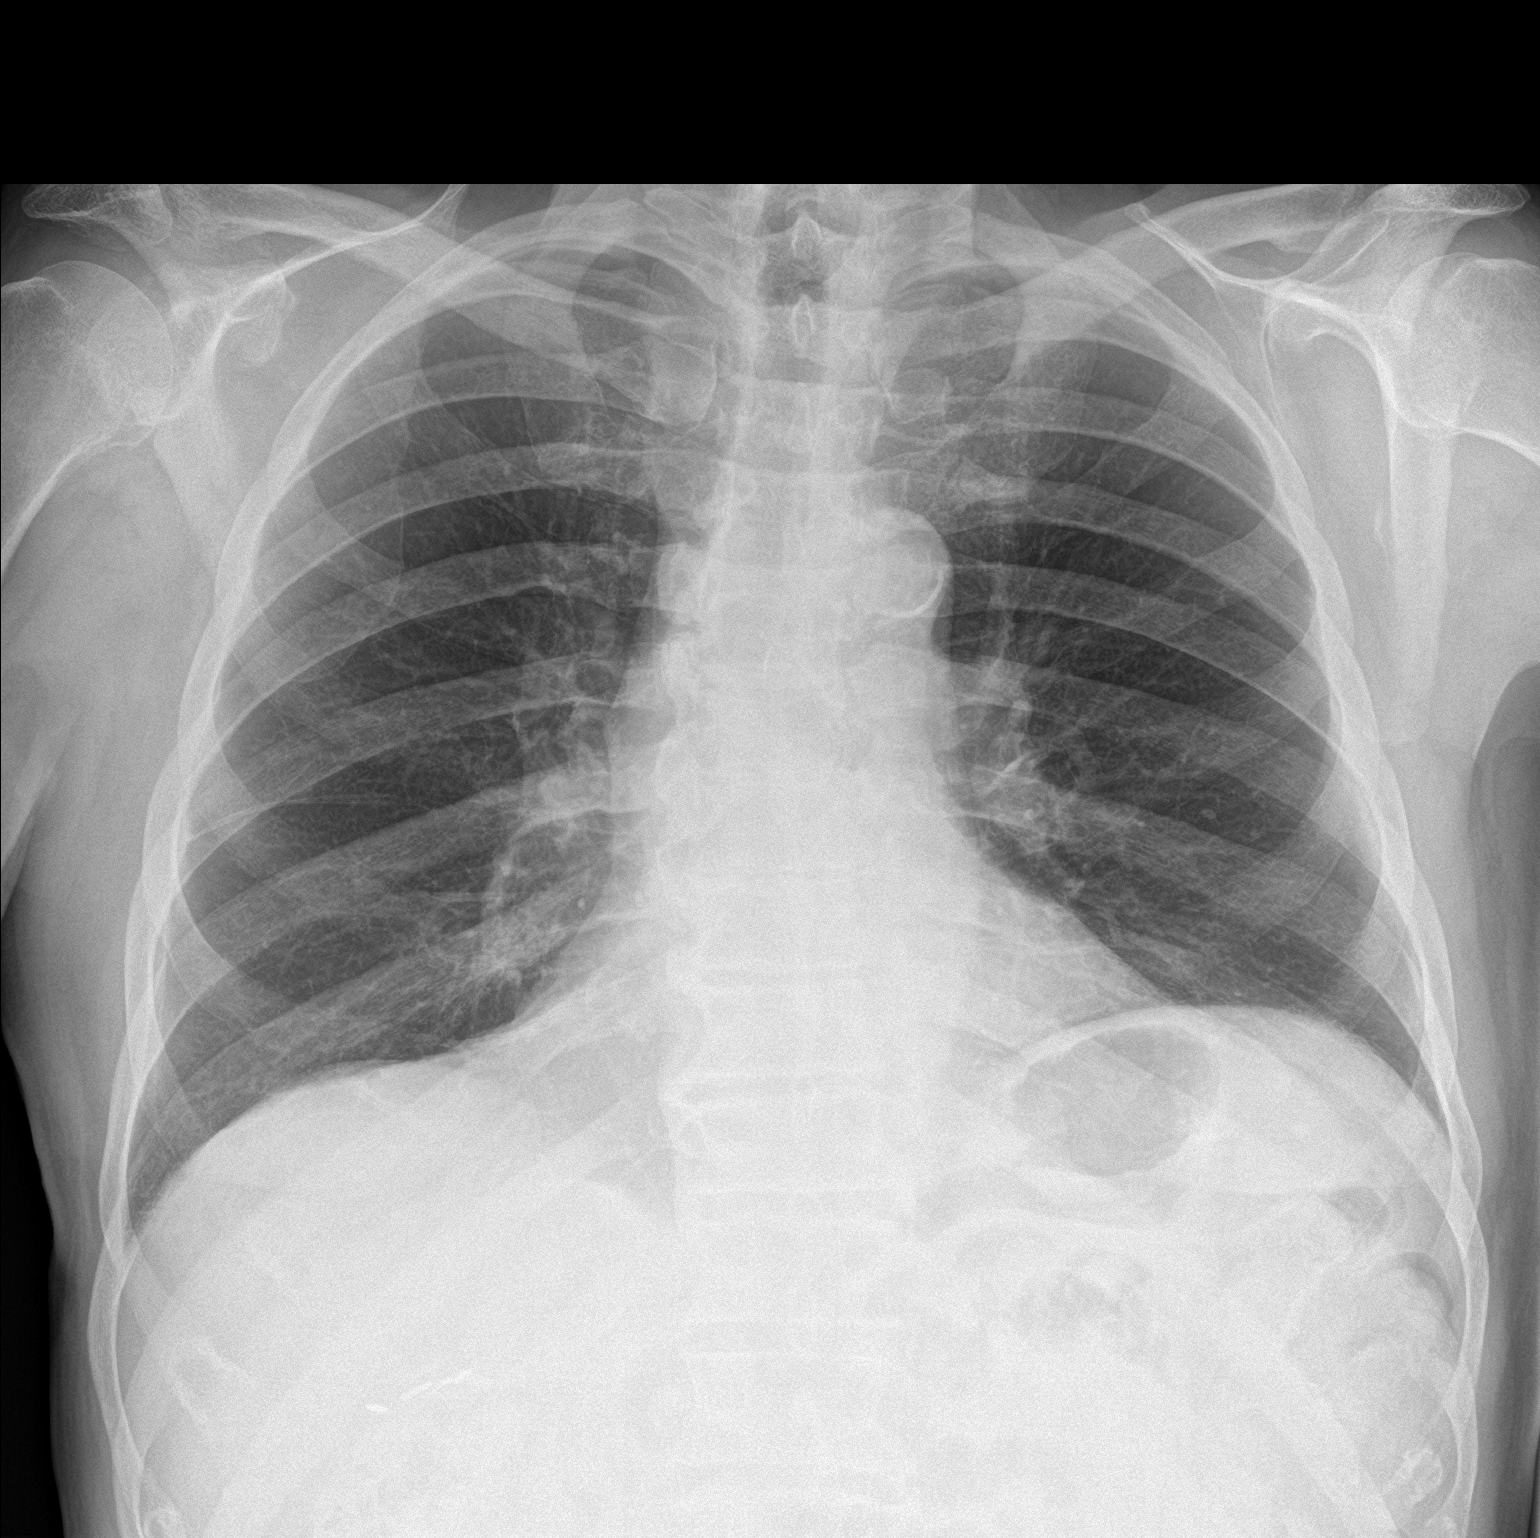

[chest lat]
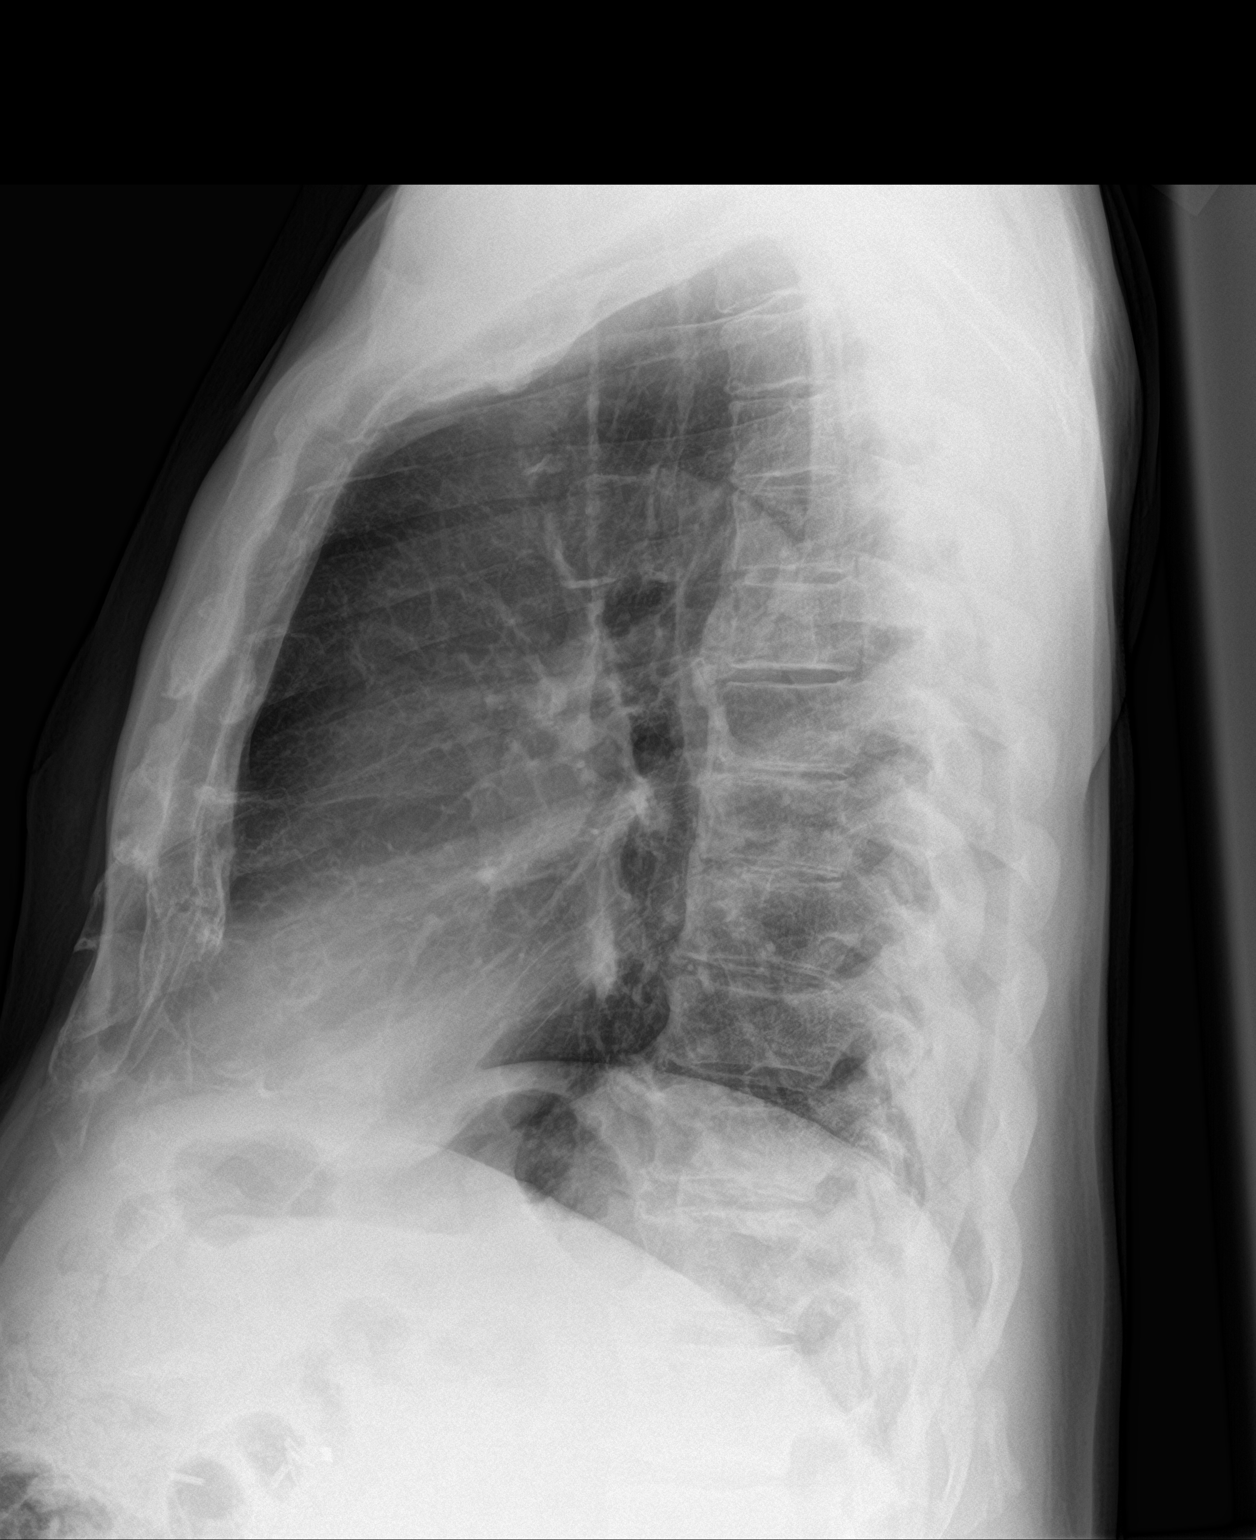

[2 of 2 positions shown; findings below may reference images not displayed]

FINDINGS: Cardiac silhouette is normal in size and configuration. Normal
mediastinal and hilar contours aortic arch atherosclerotic
calcifications.

Clear lungs.  No pleural effusion or pneumothorax.

Skeletal structures are intact
IMPRESSION: No active cardiopulmonary disease.

## 2020-07-19 IMAGING — CR DG ELBOW COMPLETE 3+V*R*
4 series · 4 of 4 positions shown · non-contrast
Comparison: None.

CLINICAL DATA: Syncope and fall.  Laceration to the right elbow.

EXAM:
RIGHT ELBOW - COMPLETE 3+ VIEW

[elbow ap]
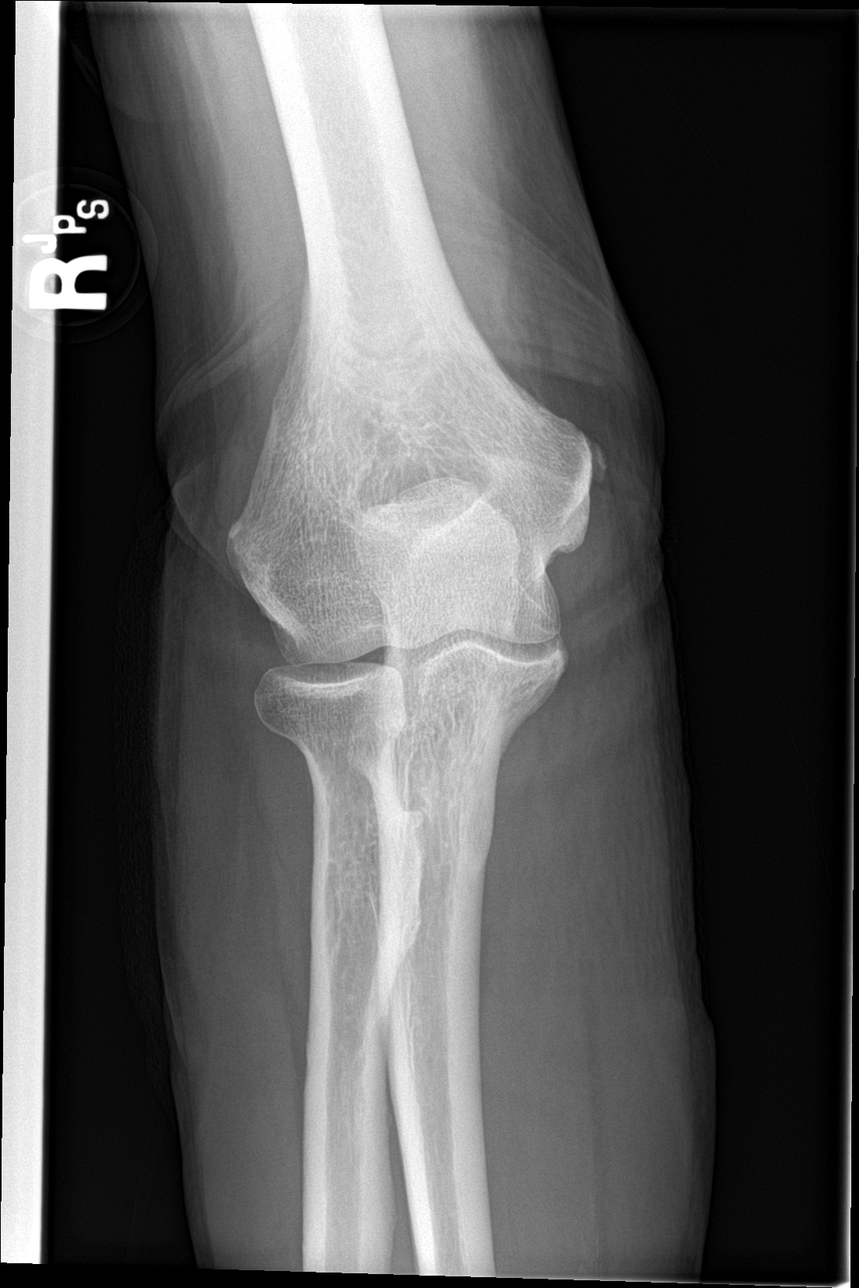

[elbow obl (1 of 2)]
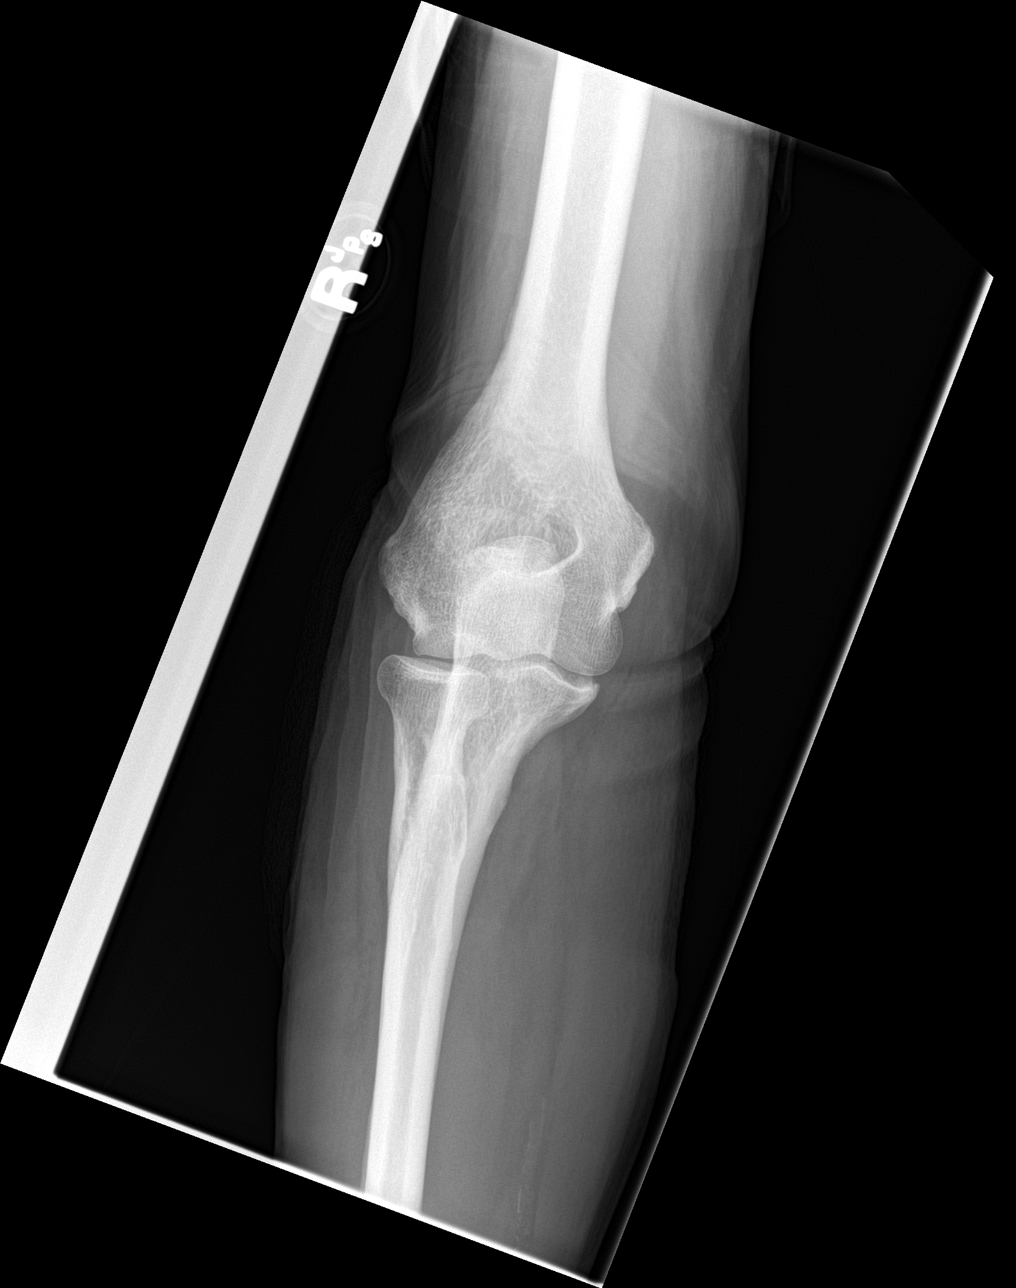

[elbow obl (2 of 2)]
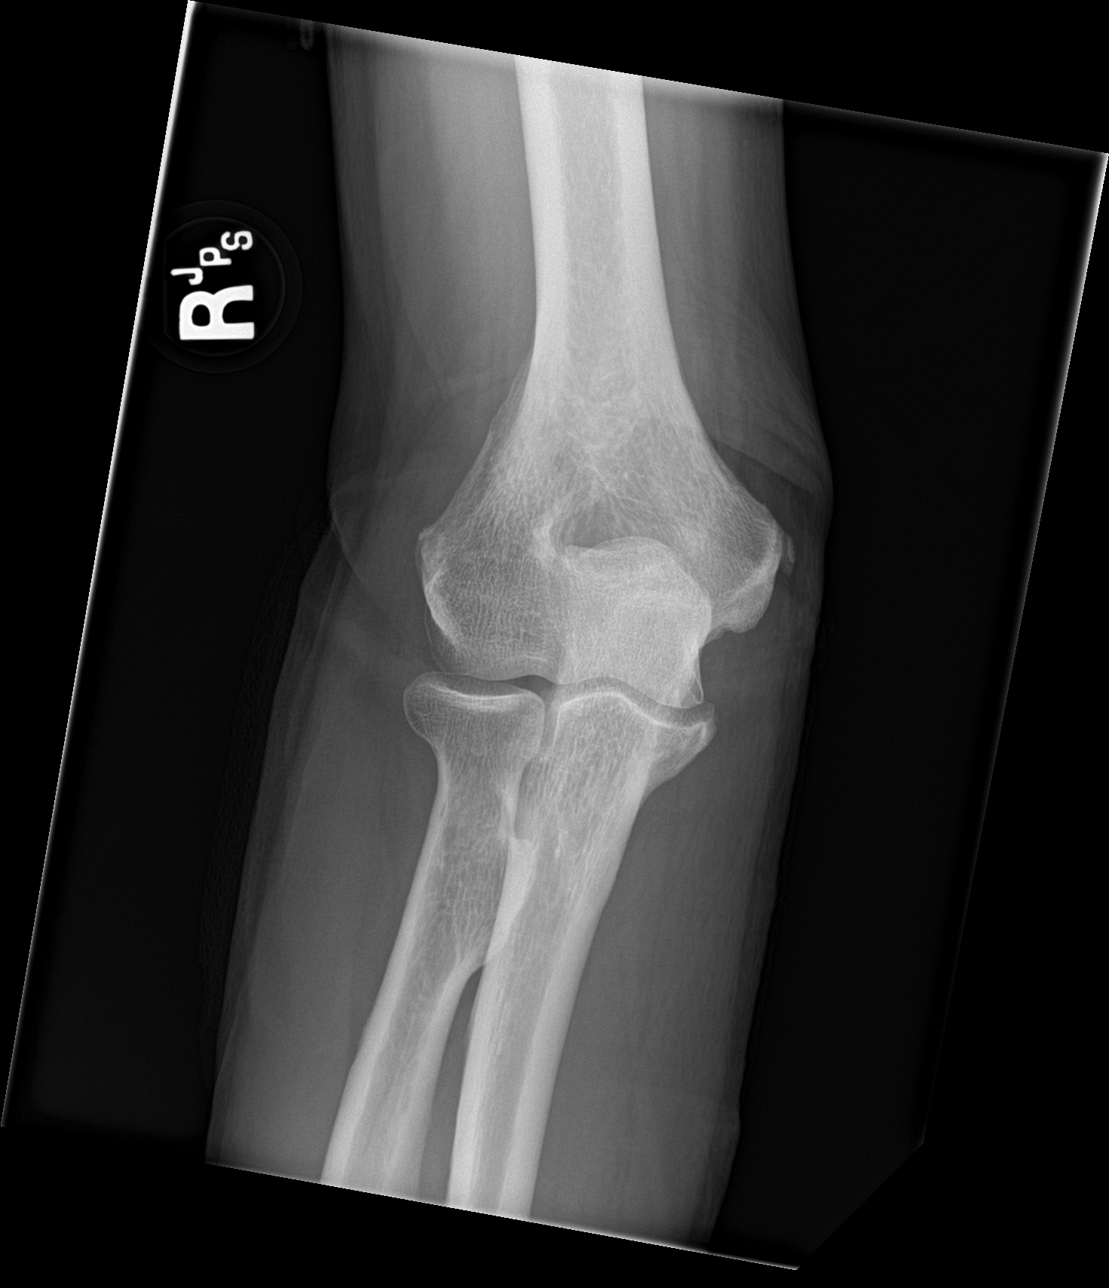

[elbow lat]
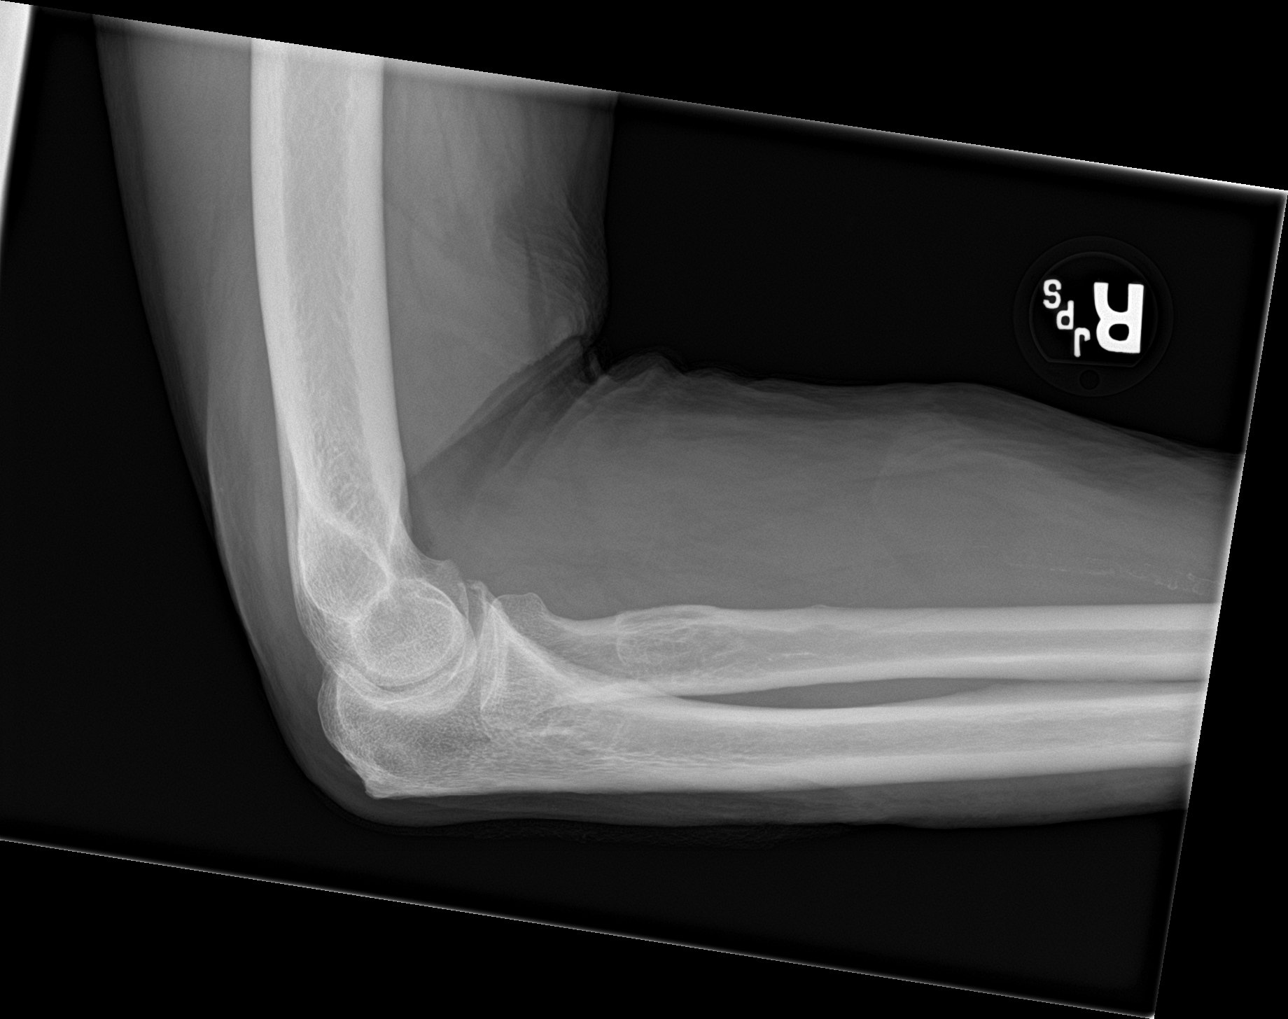

[4 of 4 positions shown; findings below may reference images not displayed]

FINDINGS: No fracture or bone lesion.

Elbow joint normally spaced and aligned. No arthropathic changes. No
joint effusion.

No radiopaque foreign body.
IMPRESSION: 1. No fracture, elbow joint abnormality or radiopaque foreign body.

## 2020-07-19 NOTE — ED Provider Notes (Signed)
Emergency Medicine Provider Triage Evaluation Note  Harry Zamora , a 80 y.o. male  was evaluated in triage.  Pt complains of syncope and fall.  Patient sent from urgent care.  He was sitting down this morning and passed out, falling out of the chair and striking his head and his right elbow.  He had a laceration to the right elbow which she reports they were repaired in urgent care.  He reports feeling a bit lightheaded and woozy before passing out but not having any chest pain, shortness of breath or palpitation.  Did hit his head, is not on blood thinners.  Denies any other injuries or pain.  No history of syncope  Review of Systems  Positive: Syncope, head injury, laceration, lightheadedness Negative: Chest pain, shortness of breath, vomiting, diarrhea, fever, neck or back pain  Physical Exam  BP 134/78 (BP Location: Right Arm)   Pulse 63   Temp 98.7 F (37.1 C)   Resp 16   SpO2 100%  Gen:   Awake, no distress   Resp:  Normal effort  MSK:   Moves extremities without difficulty, no midline spinal tenderness Other:  Small hematoma noted over the posterior scalp, laceration to the right elbow repaired at urgent care with dressing in place  Medical Decision Making  Medically screening exam initiated at 12:33 PM.  Appropriate orders placed.  [facility] was informed that the remainder of the evaluation will be completed by another provider, this initial triage assessment does not replace that evaluation, and the importance of remaining in the ED until their evaluation is complete.     Jacqlyn Larsen, PA-C 07/19/20 1249    Davonna Belling, MD 07/19/20 1606

## 2020-07-19 NOTE — ED Notes (Signed)
Pt left without being seen by a provider

## 2020-07-19 NOTE — ED Triage Notes (Signed)
Pt here from UC with c/o passing out while sitting in his chair at home , did hit his head not on blood thinners

## 2020-08-02 NOTE — Progress Notes (Signed)
Surgical Instructions    Your procedure is scheduled on May 26  Report to [facility] Main Entrance "A" at 0900 A.M., then check in with the Admitting office.  Call this number if you have problems the morning of surgery:  [phone]   If you have any questions prior to your surgery date call [phone]: Open Monday-Friday 8am-4pm    Remember:  Do not eat or drink after midnight the night before your surgery    Take these medicines the morning of surgery with A SIP OF WATER  gabapentin (NEURONTIN)  pregabalin (LYRICA)  As of today, STOP taking any Aspirin (unless otherwise instructed by your surgeon) Aleve, Naproxen, Ibuprofen, Motrin, Advil, Goody's, BC's, all herbal medications, fish oil, and all vitamins. meloxicam (MOBIC)    WHAT DO I DO ABOUT MY DIABETES MEDICATION?   Marland Kitchen Do not take oral diabetes medicines (pills) the morning of surgery. FARXIGA,  metFORMIN (GLUCOPHAGE)   . The day of surgery, do not take other diabetes injectables, including Byetta (exenatide), Bydureon (exenatide ER), Victoza (liraglutide), or Trulicity (dulaglutide).  . If your CBG is greater than 220 mg/dL, you may take  of your sliding scale (correction) dose of insulin.   HOW TO MANAGE YOUR DIABETES BEFORE AND AFTER SURGERY  Why is it important to control my blood sugar before and after surgery? . Improving blood sugar levels before and after surgery helps healing and can limit problems. . A way of improving blood sugar control is eating a healthy diet by: o  Eating less sugar and carbohydrates o  Increasing activity/exercise o  Talking with your doctor about reaching your blood sugar goals . High blood sugars (greater than 180 mg/dL) can raise your risk of infections and slow your recovery, so you will need to focus on controlling your diabetes during the weeks before surgery. . Make sure that the doctor who takes care of your diabetes knows about your planned surgery including the date and  location.  How do I manage my blood sugar before surgery? . Check your blood sugar at least 4 times a day, starting 2 days before surgery, to make sure that the level is not too high or low. . Check your blood sugar the morning of your surgery when you wake up and every 2 hours until you get to the Short Stay unit. o If your blood sugar is less than 70 mg/dL, you will need to treat for low blood sugar: - Do not take insulin. - Treat a low blood sugar (less than 70 mg/dL) with  cup of clear juice (cranberry or apple), 4 glucose tablets, OR glucose gel. - Recheck blood sugar in 15 minutes after treatment (to make sure it is greater than 70 mg/dL). If your blood sugar is not greater than 70 mg/dL on recheck, call [phone] for further instructions. . Report your blood sugar to the short stay nurse when you get to Short Stay.  . If you are admitted to the hospital after surgery: o Your blood sugar will be checked by the staff and you will probably be given insulin after surgery (instead of oral diabetes medicines) to make sure you have good blood sugar levels. o The goal for blood sugar control after surgery is 80-180 mg/dL.                     Do not wear jewelry            Do not wear lotions, powders, colognes, or  deodorant.             Men may shave face and neck.            Do not bring valuables to the hospital.            [facility] is not responsible for any belongings or valuables.  Do NOT Smoke (Tobacco/Vaping) or drink Alcohol 24 hours prior to your procedure If you use a CPAP at night, you may bring all equipment for your overnight stay.   Contacts, glasses, dentures or bridgework may not be worn into surgery, please bring cases for these belongings   For patients admitted to the hospital, discharge time will be determined by your treatment team.   Patients discharged the day of surgery will not be allowed to drive home, and someone needs to stay with them for 24  hours.    Special instructions:    Oral Hygiene is also important to reduce your risk of infection.  Remember - BRUSH YOUR TEETH THE MORNING OF SURGERY WITH YOUR REGULAR TOOTHPASTE   [place]- Preparing For Surgery  Before surgery, you can play an important role. Because skin is not sterile, your skin needs to be as free of germs as possible. You can reduce the number of germs on your skin by washing with CHG (chlorahexidine gluconate) Soap before surgery.  CHG is an antiseptic cleaner which kills germs and bonds with the skin to continue killing germs even after washing.     Please do not use if you have an allergy to CHG or antibacterial soaps. If your skin becomes reddened/irritated stop using the CHG.  Do not shave (including legs and underarms) for at least 48 hours prior to first CHG shower. It is OK to shave your face.  Please follow these instructions carefully.    1.  Shower the NIGHT BEFORE SURGERY and the MORNING OF SURGERY with CHG Soap.   If you chose to wash your hair, wash your hair first as usual with your normal shampoo. After you shampoo, rinse your hair and body thoroughly to remove the shampoo.  Then [employer] and genitals (private parts) with your normal soap and rinse thoroughly to remove soap.  2. After that Use CHG Soap as you would any other liquid soap. You can apply CHG directly to the skin and wash gently with a scrungie or a clean washcloth.   3. Apply the CHG Soap to your body ONLY FROM THE NECK DOWN.  Do not use on open wounds or open sores. Avoid contact with your eyes, ears, mouth and genitals (private parts). Wash Face and genitals (private parts)  with your normal soap.   4. Wash thoroughly, paying special attention to the area where your surgery will be performed.  5. Thoroughly rinse your body with warm water from the neck down.  6. DO NOT shower/wash with your normal soap after using and rinsing off the CHG Soap.  7. Pat yourself dry with a  CLEAN TOWEL.  8. Wear CLEAN PAJAMAS to bed the night before surgery  9. Place CLEAN SHEETS on your bed the night before your surgery  10. DO NOT SLEEP WITH PETS.   Day of Surgery: Take a shower with CHG soap. Wear Clean/Comfortable clothing the morning of surgery Do not apply any deodorants/lotions.   Remember to brush your teeth WITH YOUR REGULAR TOOTHPASTE.   Please read over the following fact sheets that you were given.

## 2020-08-05 ENCOUNTER — Encounter (HOSPITAL_COMMUNITY): Payer: Self-pay

## 2020-08-05 ENCOUNTER — Other Ambulatory Visit: Payer: Self-pay

## 2020-08-05 ENCOUNTER — Encounter (HOSPITAL_COMMUNITY)
Admission: RE | Admit: 2020-08-05 | Discharge: 2020-08-05 | Disposition: A | Discharge status: Home / Self Care | Payer: Medicare Other | Source: Ambulatory Visit | Attending: Neurosurgery | Admitting: Neurosurgery

## 2020-08-05 DIAGNOSIS — Z01818 Encounter for other preprocedural examination: Secondary | ICD-10-CM | POA: Insufficient documentation

## 2020-08-05 DIAGNOSIS — Z20822 Contact with and (suspected) exposure to covid-19: Secondary | ICD-10-CM | POA: Insufficient documentation

## 2020-08-05 HISTORY — DX: Personal history of urinary calculi: Z87.442

## 2020-08-05 LAB — HEMOGLOBIN A1C
Hgb A1c MFr Bld: 9.5 % — ABNORMAL HIGH (ref 4.8–5.6)
Mean Plasma Glucose: 225.95 mg/dL

## 2020-08-05 LAB — BASIC METABOLIC PANEL
Anion gap: 8 (ref 5–15)
BUN: 28 mg/dL — ABNORMAL HIGH (ref 8–23)
CO2: 26 mmol/L (ref 22–32)
Calcium: 9.3 mg/dL (ref 8.9–10.3)
Chloride: 103 mmol/L (ref 98–111)
Creatinine, Ser: 1.26 mg/dL — ABNORMAL HIGH (ref 0.61–1.24)
GFR, Estimated: 58 mL/min — ABNORMAL LOW (ref >60)
Glucose, Bld: 232 mg/dL — ABNORMAL HIGH (ref 70–99)
Potassium: 4.4 mmol/L (ref 3.5–5.1)
Sodium: 137 mmol/L (ref 135–145)

## 2020-08-05 LAB — CBC
HCT: 42.6 % (ref 39.0–52.0)
Hemoglobin: 13.5 g/dL (ref 13.0–17.0)
MCH: 30.7 pg (ref 26.0–34.0)
MCHC: 31.7 g/dL (ref 30.0–36.0)
MCV: 96.8 fL (ref 80.0–100.0)
Platelets: 206 10*3/uL (ref 150–400)
RBC: 4.4 MIL/uL (ref 4.22–5.81)
RDW: 13.5 % (ref 11.5–15.5)
WBC: 9.6 10*3/uL (ref 4.0–10.5)
nRBC: 0 % (ref 0.0–0.2)

## 2020-08-05 LAB — SURGICAL PCR SCREEN
MRSA, PCR: NEGATIVE
Staphylococcus aureus: POSITIVE — AB

## 2020-08-05 LAB — GLUCOSE, CAPILLARY: Glucose-Capillary: 192 mg/dL — ABNORMAL HIGH (ref 70–99)

## 2020-08-05 LAB — TYPE AND SCREEN
ABO/RH(D): O POS
Antibody Screen: NEGATIVE

## 2020-08-05 LAB — SARS CORONAVIRUS 2 (TAT 6-24 HRS): SARS Coronavirus 2: NEGATIVE

## 2020-08-05 NOTE — Progress Notes (Addendum)
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   Blood Thinner Instructions: n/a Aspirin Instructions: n/a  COVID TEST- 08/05/20; pending.    Anesthesia review: Yes, A1C 9.5  Patient denies shortness of breath, fever, cough and chest pain at PAT appointment   All instructions explained to the patient, with a verbal understanding of the material. Patient agrees to go over the instructions while at home for a better understanding. Patient also instructed to self quarantine after being tested for COVID-19. The opportunity to ask questions was provided.

## 2020-08-06 ENCOUNTER — Encounter (HOSPITAL_COMMUNITY): Payer: Self-pay | Admitting: Physician Assistant

## 2020-08-06 NOTE — H&P (Signed)
Patient ID:   [id number] Patient: Harry Zamora  Date of Birth: 05/28/1940 Visit Type: Office Visit   Date: 07/05/2020 01:00 PM Provider: Marchia Meiers. Vertell Limber MD   This 80 year old male presents for back pain.  HISTORY OF PRESENT ILLNESS: 1.  back pain  Patient returns to review CT myelogram  On review of the patient's CT myelogram there are multiple problems.  He has a pseudoarthrosis of the L5-S1 level with incomplete bone growth at this level with a fractured left sacral screw.  The right-sided screw appears to have pulled back somewhat but has not fractured.  He has developed progressive degenerative changes at the L2-3 level with stenosis at this level.  He has incomplete bone growth L3-4 and L4-5 levels with what appears to be intact interbody grafts from XL IF procedure.  Hardware appears to be well-positioned and intact without loosening at these levels (L 3, L4, L5)  The patient is in considerable pain and says he cannot live with his current problem.  I have recommended proceeding with surgical decompression and stabilization.  This will require L2-3 PLIF with pelvis fixation and revision of sacral instrumentation with Airo.  Because of his poor bone growth we will go ahead with a bone growth stimulator.  Patient is aware of risks and benefits and wishes to proceed with surgery.      Medical/Surgical/Interim History Reviewed, no change.  Last detailed document date:09/14/2016.     PAST MEDICAL HISTORY, SURGICAL HISTORY, FAMILY HISTORY, SOCIAL HISTORY AND REVIEW OF SYSTEMS I have reviewed the patient's past medical, surgical, family and social history as well as the comprehensive review of systems as included on the [facility] history form dated 07/19/2019, which I have signed.  Family History: Reviewed, no changes.  Last detailed document date:09/14/2016.   Social History: Reviewed, no changes. Last detailed document date: 09/14/2016.     MEDICATIONS: (added, continued or stopped this visit) Started Medication Directions Instruction Stopped 10/30/2019 Cymbalta 30 mg capsule,delayed release take 1 capsule by oral route  every day   06/10/2020 diclofenac sodium 75 mg tablet,delayed release take 1 tablet by oral route 2 times every day    Farxiga 5 mg tablet take 1 tablet by oral route  every day in the morning   06/10/2020 gabapentin 300 mg capsule Take 1 po qHS x3, then BID x3, then TID    glipizide 5 mg tablet take 1 tablet by oral route 2 times every day before meals    lisinopril 20 mg tablet take 1 tablet by oral route  every day    lovastatin 40 mg tablet take 1 tablet by oral route  every day with the evening meal    metformin 500 mg tablet take 1 tablet by oral route 2 times every day with morning and evening meals    ropinirole 4 mg tablet take 1 tablet by oral route  every day   06/10/2020 tramadol 50 mg tablet take 1 tablet by oral route  every 6 hours as needed      ALLERGIES: Ingredient Reaction Medication Name Comment NIACIN Rash    Reviewed, no changes.    PHYSICAL EXAM:  Vitals Date Temp F BP Pulse Ht In Wt Lb BMI BSA Pain Score 07/05/2020  148/70 84 74 187.4 24.06  6/10     IMPRESSION:  Pseudoarthrosis L5-S1 with broken hardware and progressive degenerative changes at the L2-3 level  PLAN: Proceed with revision of L5-S1 fusion and extension of fusion to  the L2-3 level.  Patient is aware of risks and benefits of surgery and wishes to proceed with surgical intervention.  Patient education was performed in detail with the patient today.  Orders: Office Procedures/Services: Assessment Service Comments  Bone growth stimulator:  [facility] [employer] [phone]   Diagnostic Procedures: Assessment Procedure M54.16 Lumbar Spine- AP/Lat  Assessment/Plan  # Detail Type Description  1. Assessment Low back  pain, unspecified back pain laterality, unspecified chronicity, unspecified whether sciatica present (M54.50).     2. Assessment Radiculopathy, lumbar region (M54.16).     3. 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     4. 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     5. Other Orders Orders not associated to today's assessments.  Plan Orders Bone growth stimulator:  [facility] [phone].                Provider:  Marchia Meiers. Vertell Limber MD  07/07/2020 11:47 AM    Dictation edited by: Marchia Meiers. Vertell Limber    CC Providers: [name] [facility] [address] [place],  [place]  [id number]-   Harry Rullo MD  [address] [place], [place] [address]               Electronically signed by Marchia Meiers. Vertell Limber MD on 07/07/2020 11:47 AM

## 2020-08-07 DIAGNOSIS — R55 Syncope and collapse: Secondary | ICD-10-CM | POA: Insufficient documentation

## 2020-08-07 NOTE — Progress Notes (Signed)
Anesthesia Chart Review:  Patient was recently seen at outside urgent care 07/19/2020 for complaint of fall resulting in arm laceration and head injury.  Due to head injury he was referred to the emergency department.  He was seen at [facility], ED on 07/19/2020 and at that time the episode was documented as a syncopal event, stating patient passed out while sitting in the chair, striking his head and right elbow.  Patient reportedly felt "a bit lightheaded and woozy" before the incident but denied chest pain, shortness of breath, or palpitation.  He has no prior history of syncope.  Patient left before ED work-up could be completed.  I called and spoke with patient to further clarify the incident.  He states he was sitting at his breakfast table and fell from his chair, striking his arm and head against the floor.  He does feel that he briefly lost consciousness and this was the reason for his fall.  He had no incontinence, no seizure-like activity, no postictal state.  He says loss of consciousness was very brief and when he awoke he was essentially in his usual state other than pain where he struck his arm and head.  His wife is present the time he did hear the patient fall, did not witness the event itself.  She confirms that when she got to him he was awake and conversant.  He was said he only went to be seen in urgent care due to laceration on his arm.  He denies any history of cardiovascular disease, carotid disease, palpitations, chest pain, shortness of breath.  He denies any previous cardiac testing.  He did report that he followed up with his primary care provider Dr. Annie Main self after this incident.  I called and spoke with Dr. Forde Dandy about this patient.  He confirmed that he did see the patient on 5/11 and at that time exam was benign.  No neurologic findings, normal cardiac exam.  He confirmed the patient has not had any prior cardiac testing.  Per his report, the patient was somewhat unclear on  whether he actually passed out or fell from the chair trying to reach something.  Due to benign exam and lack of recurrence of presyncope or syncope, Dr. Forde Dandy felt comfortable with continuing to monitor the patient and did not recommend any testing at that time.  He was concerned about the patient's uncontrolled DM2 with A1c of 9.5.  He felt that was certainly concern with respect to his upcoming surgery.  Preop labs show uncontrolled diabetes with blood glucose 225, A1c 9.5.  Remainder of labs unremarkable.  Discussed case with Dr. Fransisco Beau. He advised that due to recent syncopal event pt should have further workup/evaluation prior to undergoing elective surgery. Dr. Forde Dandy said he would help facilitate this. This was relayed to surgeon's office.  EKG 08/05/2020: NSR.  Rate 83.  Wynonia Musty [facility] Short Stay Center/Anesthesiology Phone [phone] 08/07/2020 9:29 AM

## 2020-08-07 NOTE — Progress Notes (Signed)
Patient referred by [name], MD for syncope  Subjective:   Harry Zamora, male    DOB: [birth year], 80 y.o.   MRN: [id number]   Chief Complaint  Patient presents with  . Loss of Consciousness     HPI  80 y.o. Caucasian male with uncontrolled type 2 diabetes mellitus, peripheral artery disease, spinal stenosis s/p prior surgeries, referred for evaluation of syncope.  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  He is very active, walks for 30 minutes most days, and works in the yard regularly.  At baseline, he does not have any symptoms of chest pain, shortness of breath, claudication with exertion.  He reports occasional episodes of shortness of breath just before going to bed.  He denies any orthopnea, PND, leg edema symptoms.  Patient with had an episode of syncope about 2 weeks ago.  He woke up at 6 AM, as usual, had his coffee.  He had not had his breakfast yet.  He was sitting at dining table.  He does not recollect having any chest pain, shortness of breath, lightheadedness symptoms.  Next thing he remembers that he was down on the floor with his wife standing next to him.  His wife called his nephew, who assisted him to sit up.  The entire episode lasted for less than a minute.  On waking up, patient did not have any confusion, loss of bowel or bladder tone, tongue bite, seizures.  Patient did not have any other symptoms rest of the day.  He has not had any recurrence of similar episodes since then.  On a separate note, patient tells me that he has had spinal surgeries in the past, and was going to undergo surgery for removal of the rods today.  However, surgery was postponed given his syncope episode, as well as uncontrolled diabetes with A1c of 9.8%.  Patient denies any claudication symptoms.  However, he has noted an area on his second toe left foot.  This started as a area of redness which he reportedly tried to pick at.  Now the area is black and discolored.   He denies any pain at the site.   Past Medical History:  Diagnosis Date  . Anemia    as an infant  . Arthritis   . Back pain   . Depression   . Disc displacement, lumbar   . Gynecomastia   . History of kidney stones   . Hyperlipidemia   . Hypertension    no longer on medications  . Hypertriglyceridemia   . Insomnia   . Low back pain   . Lumbar radiculopathy   . Lumbar stenosis   . OA (osteoarthritis)   . Polyneuropathy in diabetes(357.2)   . Prostate cancer ([place]) 2003  . Restless legs   . Ringing in ears   . RLS (restless legs syndrome)   . Type 2 diabetes mellitus ([place])      Past Surgical History:  Procedure Laterality Date  . ABDOMINAL EXPOSURE N/A 07/13/2019   Procedure: ABDOMINAL EXPOSURE;  Surgeon: Serafina Mitchell, MD;  Location: [facility] OR;  Service: Vascular;  Laterality: N/A;  anterior approach  . ANTERIOR LAT LUMBAR FUSION Right 07/13/2019   Procedure: Right Lumbar 3-4 Lumbar 4-5 Anterolateral lumbar interbody fusion;  Surgeon: Erline Levine, MD;  Location: [place];  Service: Neurosurgery;  Laterality: Right;  . ANTERIOR LUMBAR FUSION N/A 07/13/2019   Procedure: Lumbar Five-Sacral One Anterior lumbar interbody fusion;  Surgeon: Erline Levine, MD;  Location: [place] OR;  Service: Neurosurgery;  Laterality: N/A;  Anterior approach  . CHOLECYSTECTOMY    . COLONOSCOPY    . EYE SURGERY Bilateral   . GALLBLADDER SURGERY    . [place]  . LUMBAR LAMINECTOMY/DECOMPRESSION MICRODISCECTOMY N/A 10/02/2016   Procedure: L3 to S1 Laminectomy;  Surgeon: Erline Levine, MD;  Location: [place];  Service: Neurosurgery;  Laterality: N/A;  L3 to S1 Laminectomy  . LUMBAR LAMINECTOMY/DECOMPRESSION MICRODISCECTOMY Bilateral 09/07/2019   Procedure: Bilateral Lumbar Five - Sacral One Foraminotomy;  Surgeon: Erline Levine, MD;  Location: [place];  Service: Neurosurgery;  Laterality: Bilateral;  posterior  . LUMBAR PERCUTANEOUS PEDICLE SCREW 3 LEVEL N/A 07/13/2019   Procedure: Percutaneous  pedicle screw fixation from Lumbar 3 to Sacral 1;  Surgeon: Erline Levine, MD;  Location: [place];  Service: Neurosurgery;  Laterality: N/A;  . MENISCUS REPAIR Right   . PROSTATE SURGERY    . SHOULDER SURGERY Left 01/2011   rotator cuff     Social History   Tobacco Use  Smoking Status Light Tobacco Smoker  . Types: Cigars  Smokeless Tobacco Never Used  Tobacco Comment   occasional    Social History   Substance and Sexual Activity  Alcohol Use Not Currently   Comment: social-beer     Family History  Problem Relation Age of Onset  . Arthritis Mother   . Hypertension Mother   . Diabetes Mother   . Heart attack Mother   . Cancer Father      Current Outpatient Medications on File Prior to Visit  Medication Sig Dispense Refill  . Dulaglutide (TRULICITY) 1.5 JM/4.2AS SOPN Inject into the skin.    Marland Kitchen FARXIGA 10 MG TABS tablet Take 10 mg by mouth daily.     Marland Kitchen gabapentin (NEURONTIN) 300 MG capsule Take 300 mg by mouth 2 (two) times daily.    Marland Kitchen lovastatin (MEVACOR) 40 MG tablet Take 40 mg by mouth at bedtime.     . meloxicam (MOBIC) 15 MG tablet Take 15 mg by mouth daily.    . metFORMIN (GLUCOPHAGE) 500 MG tablet Take 1,000 mg by mouth 2 (two) times daily with a meal.   1  . Multiple Vitamin (MULTIVITAMIN WITH MINERALS) TABS tablet Take 1 tablet by mouth daily.    Marland Kitchen rOPINIRole (REQUIP) 4 MG tablet Take 4 mg by mouth at bedtime.    . vitamin B-12 (CYANOCOBALAMIN) 500 MCG tablet Take 500 mcg by mouth daily.     No current facility-administered medications on file prior to visit.    Cardiovascular and other pertinent studies:  EKG 08/08/2020: Sinus rhythm 76 bpm Cannot exclude old anteroseptal infarct  CT lumbar spine 06/26/2020: 1. Prior L3-S1 PLIF with fracture of the left S1 anterior screw and loosening of the right S1 anterior and posterior screws. The right posterior screw traverses the right L5-S1 facet joint. No significant interbody fusion at any level. 2. Residual  moderate right neuroforaminal stenosis at L5-S1. Asymmetric underfilling of the right S1 nerve root with suspected scar tissue surrounding the nerve root in the lateral recess 3. Progressive adjacent segment disease at L2-L3 with new moderate spinal canal and right neuroforaminal stenosis. 4. Nonobstructive right nephrolithiasis. 5. Aortic Atherosclerosis (ICD10-I70.0).    ABI 08/30/2019: Right: Velocities suggest 50-74% stenosis in the proximal PTA and 30-49%  stenosis in the proximal Peroneal a. Tibial vessel disease.  Left: No obvious evidence of stenosis. Tibial vessel disease.    Recent labs: 08/05/2020: Glucose 232, BUN/Cr 28/1.26. EGFR  58. Na/K 137/4.4.  H/H 13/42. MCV 96. Platelets 206 HbA1C 9.5% Lipid panel N/A TSH N/A  09/20/2019: Chol 123, TG 183, HDL 32, LDL 54   Review of Systems  Cardiovascular: Positive for syncope. Negative for chest pain, dyspnea on exertion, leg swelling and palpitations.         Vitals:   08/08/20 0851 08/08/20 0852  Temp:  98.4 F (36.9 C)  SpO2: 97% 100%   Orthostatic VS for the past 72 hrs (Last 3 readings):  Orthostatic BP Patient Position BP Location Cuff Size Orthostatic Pulse  08/08/20 0852 129/68 Standing Left Arm Normal 85  08/08/20 0851 128/66 Sitting Left Arm Normal 77  08/08/20 0843 139/76 Supine Left Arm Normal 76     Body mass index is 23.75 kg/m. Filed Weights   08/08/20 0843  Weight: 185 lb (83.9 kg)     Objective:   Physical Exam Vitals and nursing note reviewed.  Constitutional:      General: He is not in acute distress. Neck:     Vascular: No JVD.  Cardiovascular:     Rate and Rhythm: Normal rate and regular rhythm.     Pulses:          Femoral pulses are 2+ on the right side and 2+ on the left side.      Popliteal pulses are 2+ on the right side and 1+ on the left side.       Dorsalis pedis pulses are 2+ on the right side and 0 on the left side.       Posterior tibial pulses are 0 on the right  side and 0 on the left side.     Heart sounds: Normal heart sounds. No murmur heard.   Pulmonary:     Effort: Pulmonary effort is normal.     Breath sounds: Normal breath sounds. No wheezing or rales.  Musculoskeletal:     Right lower leg: No edema.     Left lower leg: No edema.  Skin:    Comments: 2 mm black eschar on left foot second toe     Media Information        Assessment & Recommendations:   80 y.o. Caucasian male with uncontrolled type 2 diabetes mellitus, peripheral artery disease, spinal stenosis s/p prior surgeries, referred for evaluation of syncope.  Syncope: Episode of syncope with no warning signs in 80 year old diabetic patient is concerning for cardiac etiology. Resting EKG shows possible old anteroseptal infarct. Recommend 2-week light cardiac telemetry, echocardiogram, and exercise nuclear stress test.  PAD: Abnormal vascular exam with an area of possible ischemia or atheroembolic lesion on left foot second toe.  He did not have any claudication symptoms at this time.  I will obtain lower extremity duplex ultrasound.  Recommend Plavix 75 mg given high risk PAD findings.  I discussed bleeding risks with the patient in detail.  Further recommendations after above testing.  Thank you for referring the patient to [place]. Please feel free to contact with any questions.   Nigel Mormon, MD Pager: [phone] Office: [phone]

## 2020-08-08 ENCOUNTER — Ambulatory Visit: Payer: BC Managed Care – PPO | Admitting: Cardiology

## 2020-08-08 ENCOUNTER — Encounter: Payer: Self-pay | Admitting: Cardiology

## 2020-08-08 ENCOUNTER — Other Ambulatory Visit: Payer: Self-pay

## 2020-08-08 ENCOUNTER — Inpatient Hospital Stay (HOSPITAL_COMMUNITY): Admission: RE | Admit: 2020-08-08 | Payer: Medicare Other | Source: Home / Self Care | Admitting: Neurosurgery

## 2020-08-08 ENCOUNTER — Encounter (HOSPITAL_COMMUNITY): Admission: RE | Payer: Self-pay | Source: Home / Self Care

## 2020-08-08 VITALS — Temp 98.4°F | Ht 74.0 in | Wt 185.0 lb

## 2020-08-08 DIAGNOSIS — R55 Syncope and collapse: Secondary | ICD-10-CM

## 2020-08-08 DIAGNOSIS — I739 Peripheral vascular disease, unspecified: Secondary | ICD-10-CM

## 2020-08-08 SURGERY — POSTERIOR LUMBAR FUSION 1 LEVEL
Anesthesia: General | Site: Back

## 2020-08-08 MED ORDER — CLOPIDOGREL BISULFATE 75 MG PO TABS: 75.0000 mg | ORAL_TABLET | Freq: Every day | ORAL | 3 refills | Status: DC

## 2020-08-22 ENCOUNTER — Other Ambulatory Visit: Payer: BC Managed Care – PPO

## 2020-08-28 ENCOUNTER — Inpatient Hospital Stay: Payer: BC Managed Care – PPO

## 2020-08-28 ENCOUNTER — Ambulatory Visit: Payer: BC Managed Care – PPO

## 2020-08-28 ENCOUNTER — Other Ambulatory Visit: Payer: BC Managed Care – PPO

## 2020-08-28 ENCOUNTER — Other Ambulatory Visit: Payer: Self-pay

## 2020-08-28 DIAGNOSIS — R55 Syncope and collapse: Secondary | ICD-10-CM

## 2020-08-28 DIAGNOSIS — I739 Peripheral vascular disease, unspecified: Secondary | ICD-10-CM

## 2020-09-02 ENCOUNTER — Telehealth: Payer: Self-pay | Admitting: Cardiology

## 2020-09-02 ENCOUNTER — Other Ambulatory Visit: Payer: Self-pay | Admitting: Cardiology

## 2020-09-02 DIAGNOSIS — R9439 Abnormal result of other cardiovascular function study: Secondary | ICD-10-CM

## 2020-09-02 MED ORDER — NITROGLYCERIN 0.4 MG SL SUBL: 0.4000 mg | SUBLINGUAL_TABLET | SUBLINGUAL | 3 refills | Status: AC | PRN

## 2020-09-02 MED ORDER — METOPROLOL SUCCINATE ER 25 MG PO TB24: 25.0000 mg | ORAL_TABLET | Freq: Every day | ORAL | 1 refills | Status: DC

## 2020-09-02 NOTE — Telephone Encounter (Signed)
Discussed echocardiogram, aorta duplex, lower extremity ultrasound and stress test findings with the patient.  Given his upcoming spine surgery and abnormal stress test, recommend coronary angiogram for definitive evaluation.  In the meantime, I have started him on metoprolol succinate 25 mg daily, Plavix 75 mg daily, and nitroglycerin as needed.  Cardiac telemetry results are still pending.  Schedule for cardiac catheterization, and possible angioplasty. We discussed regarding risks, benefits, alternatives to this including stress testing, CTA and continued medical therapy. Patient wants to proceed. Understands <1-2% risk of death, stroke, MI, urgent CABG, bleeding, infection, renal failure but not limited to these.   Nigel Mormon, MD Pager: [phone] Office: [phone]

## 2020-09-12 ENCOUNTER — Observation Stay (HOSPITAL_BASED_OUTPATIENT_CLINIC_OR_DEPARTMENT_OTHER)
Admission: EM | Admit: 2020-09-12 | Discharge: 2020-09-13 | Disposition: A | Discharge status: Home / Self Care | Payer: Medicare Other | Attending: Internal Medicine | Admitting: Internal Medicine

## 2020-09-12 ENCOUNTER — Emergency Department (HOSPITAL_BASED_OUTPATIENT_CLINIC_OR_DEPARTMENT_OTHER): Payer: Medicare Other

## 2020-09-12 ENCOUNTER — Other Ambulatory Visit: Payer: Self-pay

## 2020-09-12 ENCOUNTER — Other Ambulatory Visit: Payer: Self-pay | Admitting: Cardiology

## 2020-09-12 ENCOUNTER — Encounter (HOSPITAL_BASED_OUTPATIENT_CLINIC_OR_DEPARTMENT_OTHER): Payer: Self-pay | Admitting: Emergency Medicine

## 2020-09-12 DIAGNOSIS — E1122 Type 2 diabetes mellitus with diabetic chronic kidney disease: Secondary | ICD-10-CM | POA: Insufficient documentation

## 2020-09-12 DIAGNOSIS — I129 Hypertensive chronic kidney disease with stage 1 through stage 4 chronic kidney disease, or unspecified chronic kidney disease: Secondary | ICD-10-CM | POA: Insufficient documentation

## 2020-09-12 DIAGNOSIS — E875 Hyperkalemia: Secondary | ICD-10-CM | POA: Diagnosis not present

## 2020-09-12 DIAGNOSIS — R112 Nausea with vomiting, unspecified: Secondary | ICD-10-CM | POA: Diagnosis present

## 2020-09-12 DIAGNOSIS — R1084 Generalized abdominal pain: Secondary | ICD-10-CM

## 2020-09-12 DIAGNOSIS — I719 Aortic aneurysm of unspecified site, without rupture: Secondary | ICD-10-CM | POA: Diagnosis present

## 2020-09-12 DIAGNOSIS — N179 Acute kidney failure, unspecified: Secondary | ICD-10-CM | POA: Diagnosis present

## 2020-09-12 DIAGNOSIS — Z8546 Personal history of malignant neoplasm of prostate: Secondary | ICD-10-CM | POA: Diagnosis not present

## 2020-09-12 DIAGNOSIS — R55 Syncope and collapse: Secondary | ICD-10-CM | POA: Diagnosis not present

## 2020-09-12 DIAGNOSIS — R0602 Shortness of breath: Secondary | ICD-10-CM | POA: Insufficient documentation

## 2020-09-12 DIAGNOSIS — Z7984 Long term (current) use of oral hypoglycemic drugs: Secondary | ICD-10-CM | POA: Insufficient documentation

## 2020-09-12 DIAGNOSIS — Z20822 Contact with and (suspected) exposure to covid-19: Secondary | ICD-10-CM | POA: Insufficient documentation

## 2020-09-12 DIAGNOSIS — M48062 Spinal stenosis, lumbar region with neurogenic claudication: Secondary | ICD-10-CM | POA: Diagnosis present

## 2020-09-12 DIAGNOSIS — Z79899 Other long term (current) drug therapy: Secondary | ICD-10-CM | POA: Insufficient documentation

## 2020-09-12 DIAGNOSIS — N1831 Chronic kidney disease, stage 3a: Secondary | ICD-10-CM | POA: Diagnosis present

## 2020-09-12 DIAGNOSIS — I739 Peripheral vascular disease, unspecified: Secondary | ICD-10-CM | POA: Diagnosis present

## 2020-09-12 DIAGNOSIS — E119 Type 2 diabetes mellitus without complications: Secondary | ICD-10-CM

## 2020-09-12 DIAGNOSIS — R42 Dizziness and giddiness: Secondary | ICD-10-CM

## 2020-09-12 LAB — BASIC METABOLIC PANEL
Anion gap: 10 (ref 5–15)
Anion gap: 11 (ref 5–15)
BUN: 34 mg/dL — ABNORMAL HIGH (ref 8–23)
BUN: 31 mg/dL — ABNORMAL HIGH (ref 8–23)
CO2: 26 mmol/L (ref 22–32)
CO2: 24 mmol/L (ref 22–32)
Calcium: 9.9 mg/dL (ref 8.9–10.3)
Calcium: 8.7 mg/dL — ABNORMAL LOW (ref 8.9–10.3)
Chloride: 100 mmol/L (ref 98–111)
Chloride: 96 mmol/L — ABNORMAL LOW (ref 98–111)
Creatinine, Ser: 1.54 mg/dL — ABNORMAL HIGH (ref 0.61–1.24)
Creatinine, Ser: 1.4 mg/dL — ABNORMAL HIGH (ref 0.61–1.24)
GFR, Estimated: 45 mL/min — ABNORMAL LOW (ref >60)
GFR, Estimated: 50 mL/min — ABNORMAL LOW (ref >60)
Glucose, Bld: 134 mg/dL — ABNORMAL HIGH (ref 70–99)
Glucose, Bld: 121 mg/dL — ABNORMAL HIGH (ref 70–99)
Potassium: 6.5 mmol/L (ref 3.5–5.1)
Potassium: 5.1 mmol/L (ref 3.5–5.1)
Sodium: 136 mmol/L (ref 135–145)
Sodium: 131 mmol/L — ABNORMAL LOW (ref 135–145)

## 2020-09-12 LAB — URINALYSIS, ROUTINE W REFLEX MICROSCOPIC
Bilirubin Urine: NEGATIVE
Glucose, UA: >1000 mg/dL — AB
Hgb urine dipstick: NEGATIVE
Ketones, ur: NEGATIVE mg/dL
Leukocytes,Ua: NEGATIVE
Nitrite: NEGATIVE
Specific Gravity, Urine: 1.036 — ABNORMAL HIGH (ref 1.005–1.030)
pH: 5.5 (ref 5.0–8.0)

## 2020-09-12 LAB — CBC WITH DIFFERENTIAL/PLATELET
Abs Immature Granulocytes: 0.04 10*3/uL (ref 0.00–0.07)
Basophils Absolute: 0 10*3/uL (ref 0.0–0.1)
Basophils Relative: 0 %
Eosinophils Absolute: 0.2 10*3/uL (ref 0.0–0.5)
Eosinophils Relative: 2 %
HCT: 44.3 % (ref 39.0–52.0)
Hemoglobin: 14 g/dL (ref 13.0–17.0)
Immature Granulocytes: 0 %
Lymphocytes Relative: 13 %
Lymphs Abs: 1.4 10*3/uL (ref 0.7–4.0)
MCH: 30.8 pg (ref 26.0–34.0)
MCHC: 31.6 g/dL (ref 30.0–36.0)
MCV: 97.6 fL (ref 80.0–100.0)
Monocytes Absolute: 1.2 10*3/uL — ABNORMAL HIGH (ref 0.1–1.0)
Monocytes Relative: 11 %
Neutro Abs: 8.3 10*3/uL — ABNORMAL HIGH (ref 1.7–7.7)
Neutrophils Relative %: 74 %
Platelets: 300 10*3/uL (ref 150–400)
RBC: 4.54 MIL/uL (ref 4.22–5.81)
RDW: 14.3 % (ref 11.5–15.5)
WBC: 11.2 10*3/uL — ABNORMAL HIGH (ref 4.0–10.5)
nRBC: 0 % (ref 0.0–0.2)

## 2020-09-12 LAB — COMPREHENSIVE METABOLIC PANEL
ALT: 11 U/L (ref 0–44)
AST: 15 U/L (ref 15–41)
Albumin: 5 g/dL (ref 3.5–5.0)
Alkaline Phosphatase: 92 U/L (ref 38–126)
Anion gap: 13 (ref 5–15)
BUN: 36 mg/dL — ABNORMAL HIGH (ref 8–23)
CO2: 26 mmol/L (ref 22–32)
Calcium: 10.2 mg/dL (ref 8.9–10.3)
Chloride: 100 mmol/L (ref 98–111)
Creatinine, Ser: 1.62 mg/dL — ABNORMAL HIGH (ref 0.61–1.24)
GFR, Estimated: 42 mL/min — ABNORMAL LOW (ref >60)
Glucose, Bld: 156 mg/dL — ABNORMAL HIGH (ref 70–99)
Potassium: 6.1 mmol/L — ABNORMAL HIGH (ref 3.5–5.1)
Sodium: 139 mmol/L (ref 135–145)
Total Bilirubin: 0.5 mg/dL (ref 0.3–1.2)
Total Protein: 8.7 g/dL — ABNORMAL HIGH (ref 6.5–8.1)

## 2020-09-12 LAB — TROPONIN I (HIGH SENSITIVITY)
Troponin I (High Sensitivity): 3 ng/L (ref <18)
Troponin I (High Sensitivity): 3 ng/L (ref <18)

## 2020-09-12 LAB — RESP PANEL BY RT-PCR (FLU A&B, COVID) ARPGX2
Influenza A by PCR: NEGATIVE
Influenza B by PCR: NEGATIVE
SARS Coronavirus 2 by RT PCR: NEGATIVE

## 2020-09-12 LAB — LIPASE, BLOOD: Lipase: 46 U/L (ref 11–51)

## 2020-09-12 LAB — GLUCOSE, CAPILLARY: Glucose-Capillary: 88 mg/dL (ref 70–99)

## 2020-09-12 IMAGING — CT CT HEAD W/O CM
4 series · 17 of 47 positions shown, 19 images · non-contrast
Comparison: None.

CLINICAL DATA: Dizziness.

EXAM:
CT HEAD WITHOUT CONTRAST
TECHNIQUE: Contiguous axial images were obtained from the base of the skull
through the vertex without intravenous contrast.

[Series 2: head wo · axial · 0.39mm/px · z∈[-44,+66]mm · 7 of 30 slices shown, 9 images]
[im 4/30  brain]
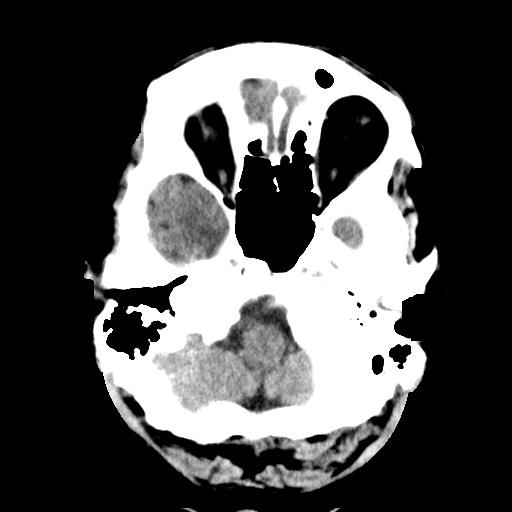
[im 4/30  bone]
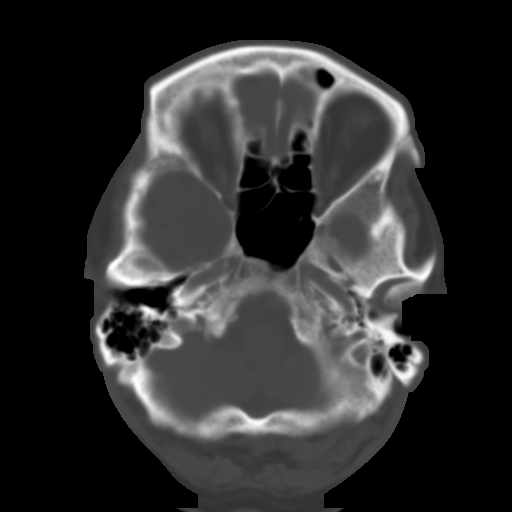
[im 8/30  brain]
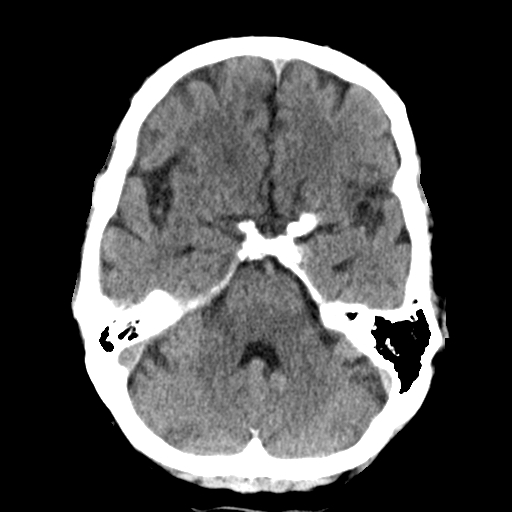
[im 11/30  brain]
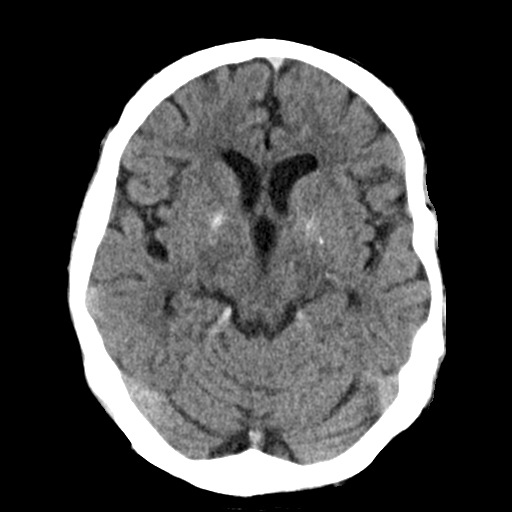
[im 15/30  brain]
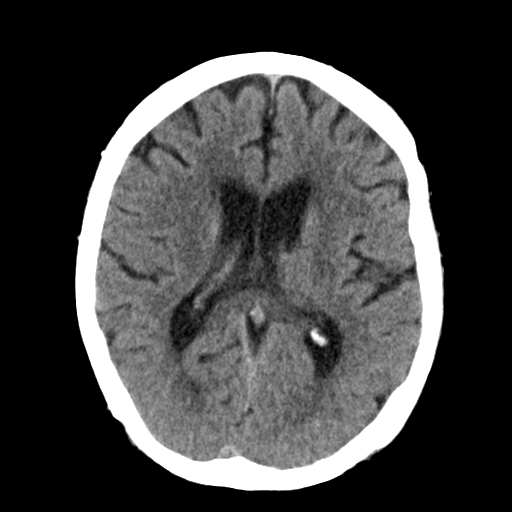
[im 19/30  brain]
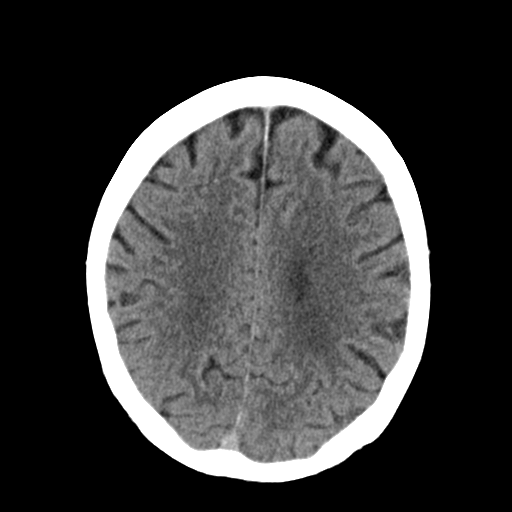
[im 19/30  bone]
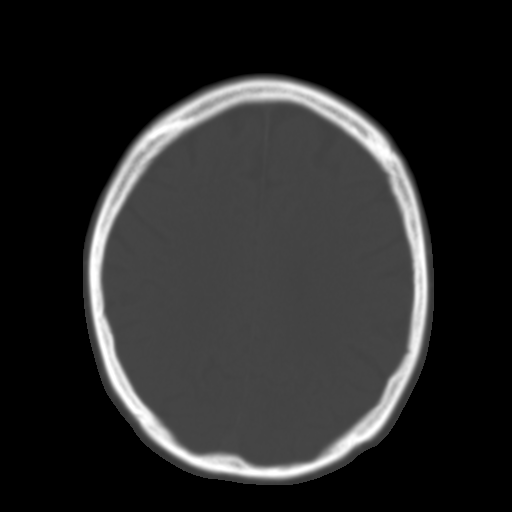
[im 22/30  brain]
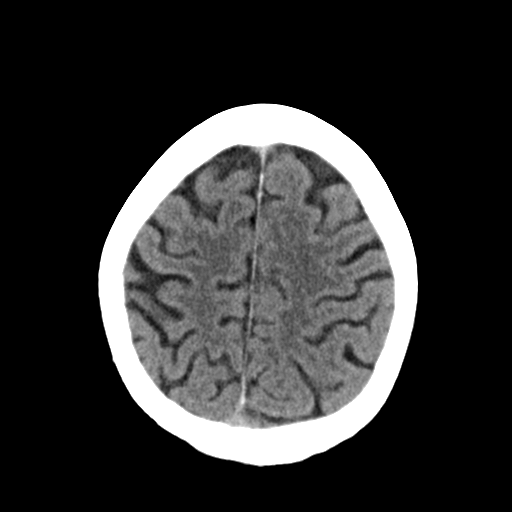
[im 26/30  brain]
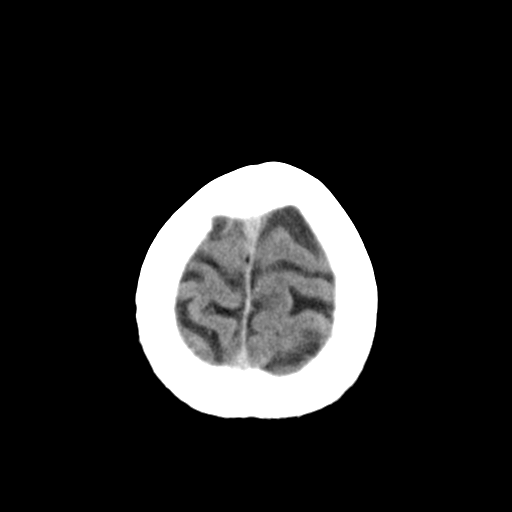

[Series 3: head bone · axial · 0.39mm/px · z∈[-45,+5]mm · 4 of 74 slices shown]
[im 8/74  bone]
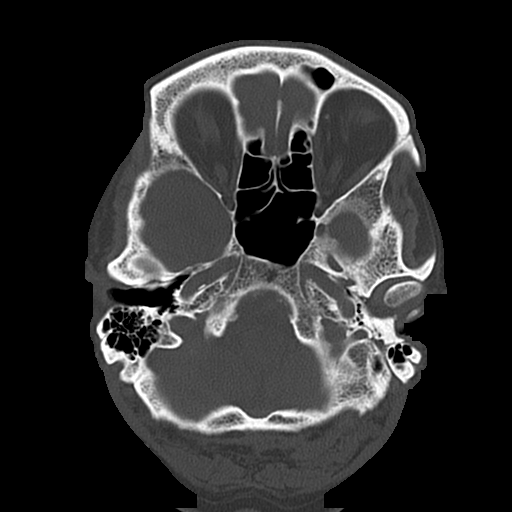
[im 15/74  bone]
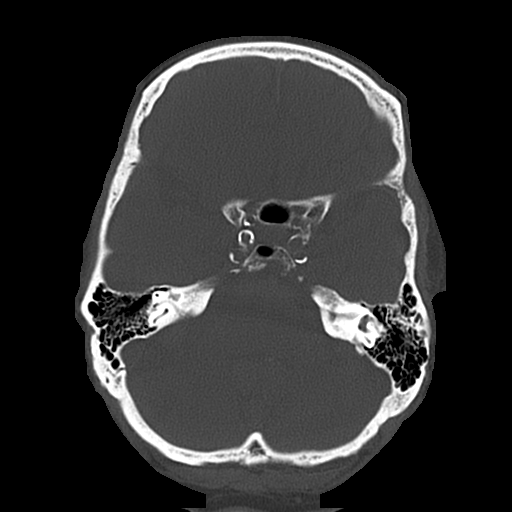
[im 22/74  bone]
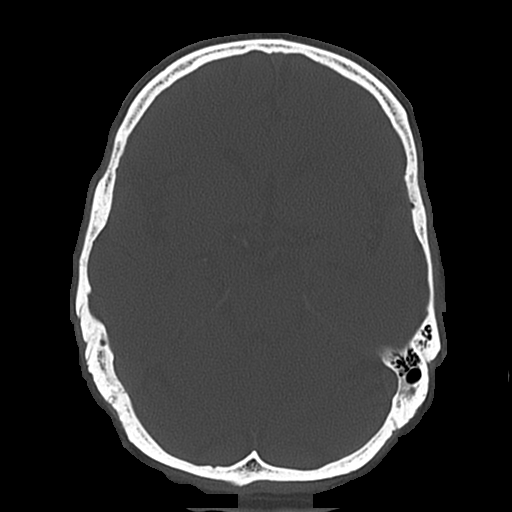
[im 33/74  bone]
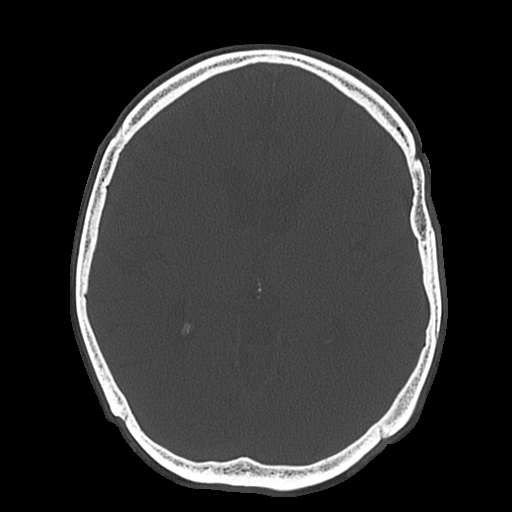

[Series 4: coronal soft · coronal · 0.33mm/px · 3 of 62 slices shown]
[im 21/62  brain]
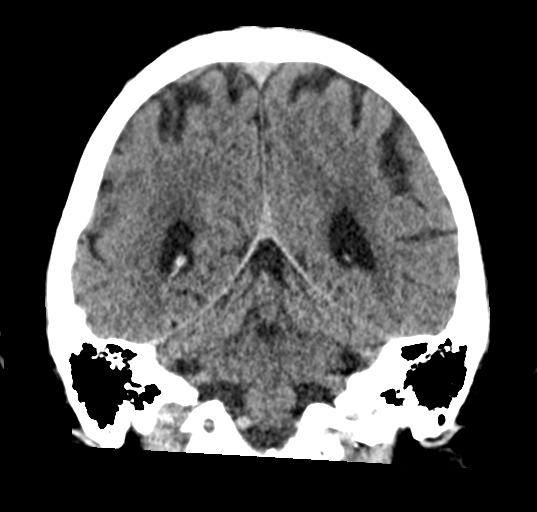
[im 28/62  brain]
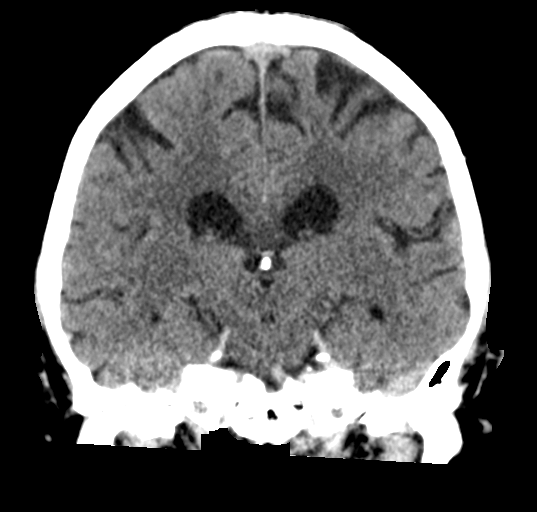
[im 34/62  brain]
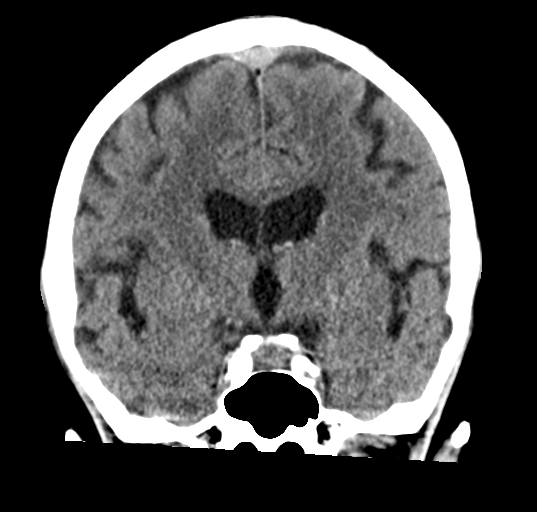

[Series 5: sagittal soft · sagittal · 0.33mm/px · 3 of 59 slices shown]
[im 20/59  brain]
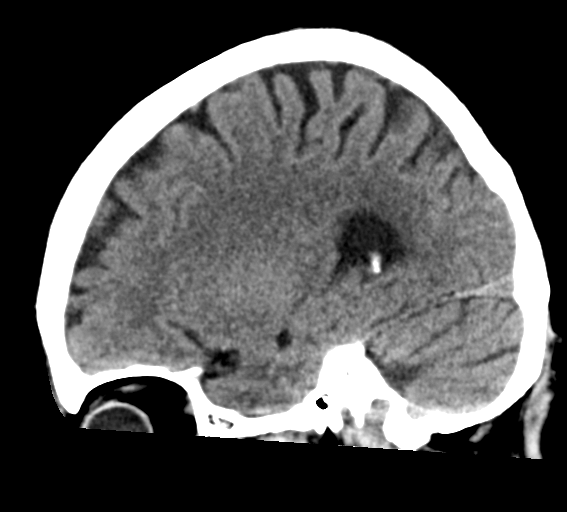
[im 30/59  brain]
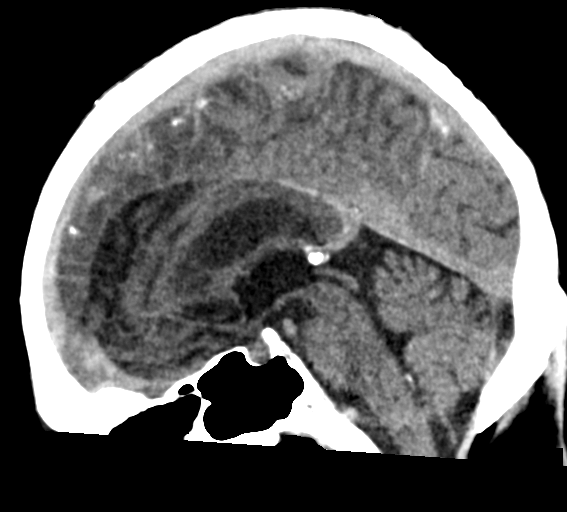
[im 39/59  brain]
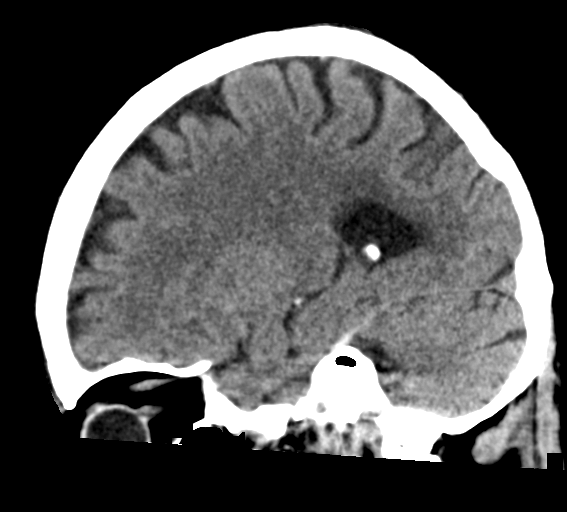

[17 of 47 positions shown; findings below may reference images not displayed]

FINDINGS: Brain: There is mild cerebral atrophy with widening of the
extra-axial spaces and ventricular dilatation.
There are areas of decreased attenuation within the white matter
tracts of the supratentorial brain, consistent with microvascular
disease changes.

Vascular: No hyperdense vessel or unexpected calcification.

Skull: Normal. Negative for fracture or focal lesion.

Sinuses/Orbits: No acute finding.

Other: None.
IMPRESSION: 1. Generalized cerebral atrophy.
2. No acute intracranial abnormality.

## 2020-09-12 IMAGING — CT CT ANGIO CHEST-ABD-PELV FOR DISSECTION W/ AND WO/W CM
2 of 7 series · 14 of 46 positions shown, 16 images · IV contrast (APPLIED)
Comparison: CT abdomen and pelvis [DATE].

CLINICAL DATA: Dizziness, weakness, intermittent nausea and
vomiting for the past month.

EXAM:
CT ANGIOGRAPHY CHEST, ABDOMEN AND PELVIS
TECHNIQUE: Non-contrast CT of the chest was initially obtained.

[Series 6: arterial (person_name) · axial · arterial · 0.77mm/px · z∈[-835,-225]mm · 11 of 339 slices shown, 13 images]
[im 17/339  soft-tissue]
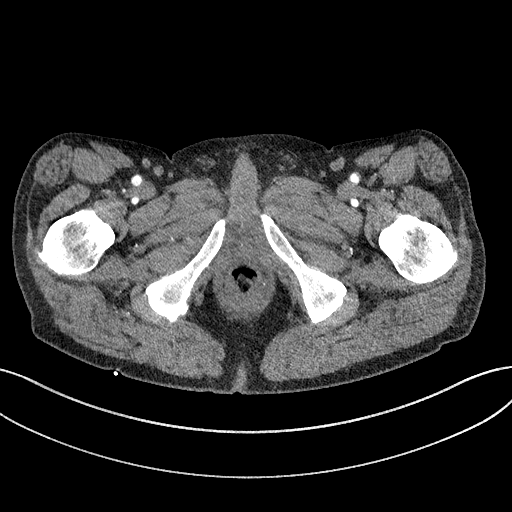
[im 17/339  bone]
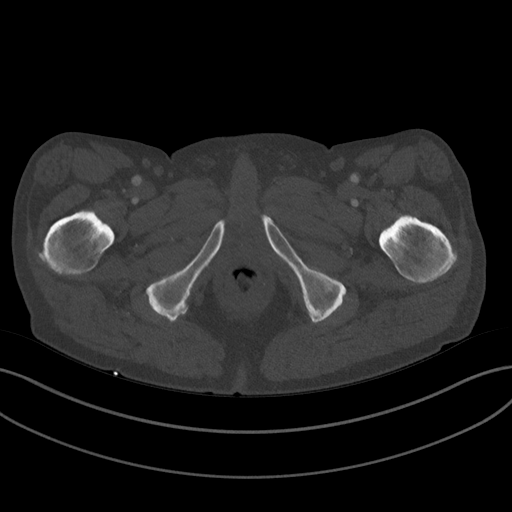
[im 51/339  soft-tissue]
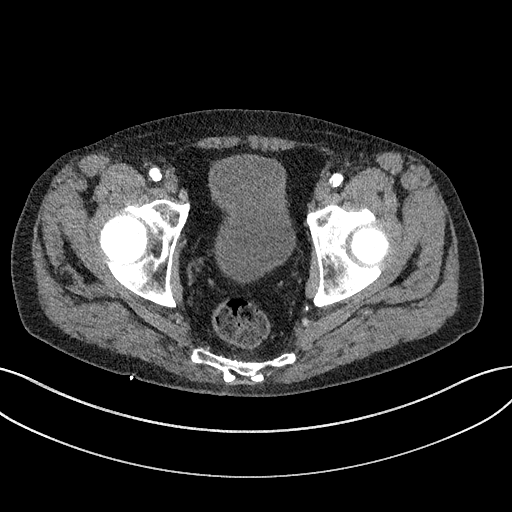
[im 85/339  soft-tissue]
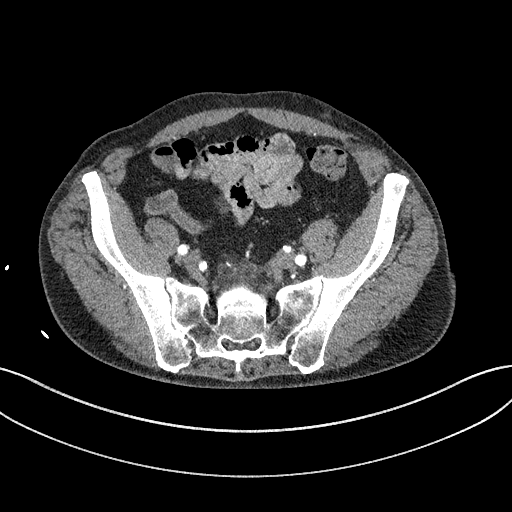
[im 119/339  soft-tissue]
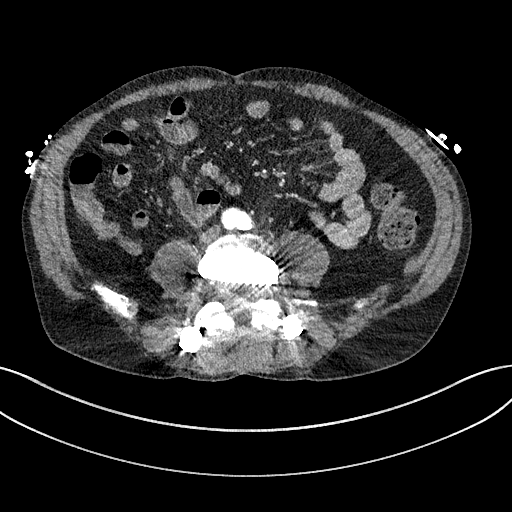
[im 136/339  soft-tissue]
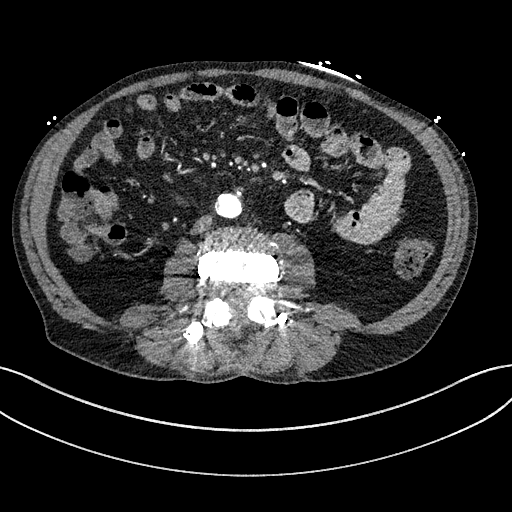
[im 170/339  soft-tissue]
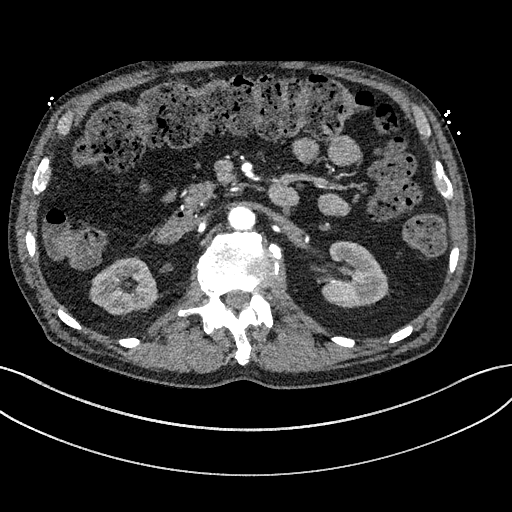
[im 203/339  soft-tissue]
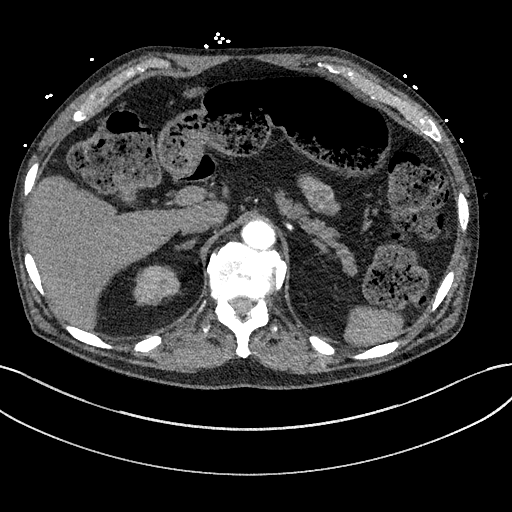
[im 220/339  soft-tissue]
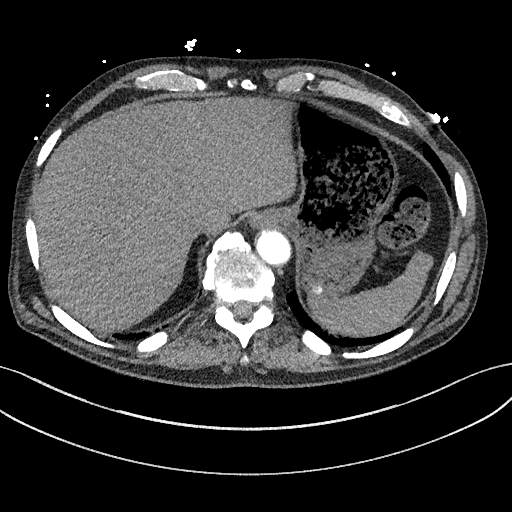
[im 254/339  soft-tissue]
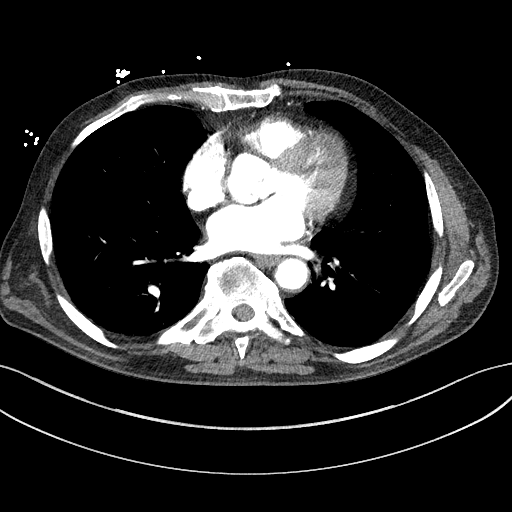
[im 254/339  bone]
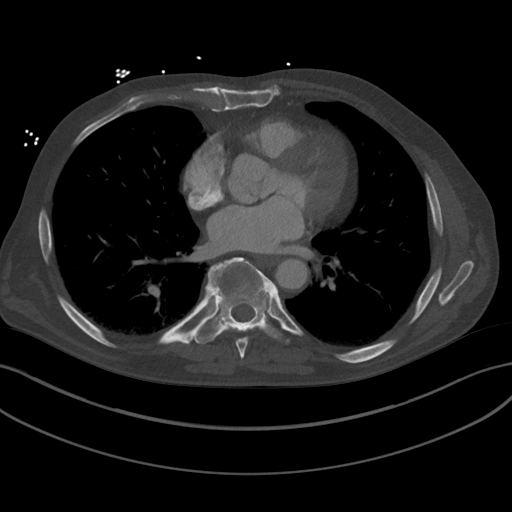
[im 288/339  soft-tissue]
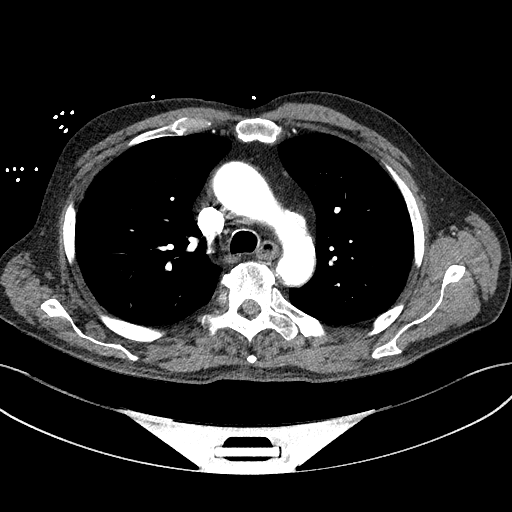
[im 322/339  soft-tissue]
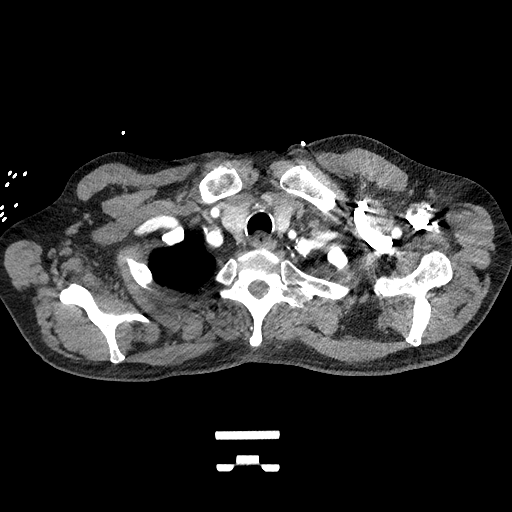

[Series 9: cor soft (person_name) · coronal · 0.82mm/px · 3 of 140 slices shown]
[im 35/140  soft-tissue]
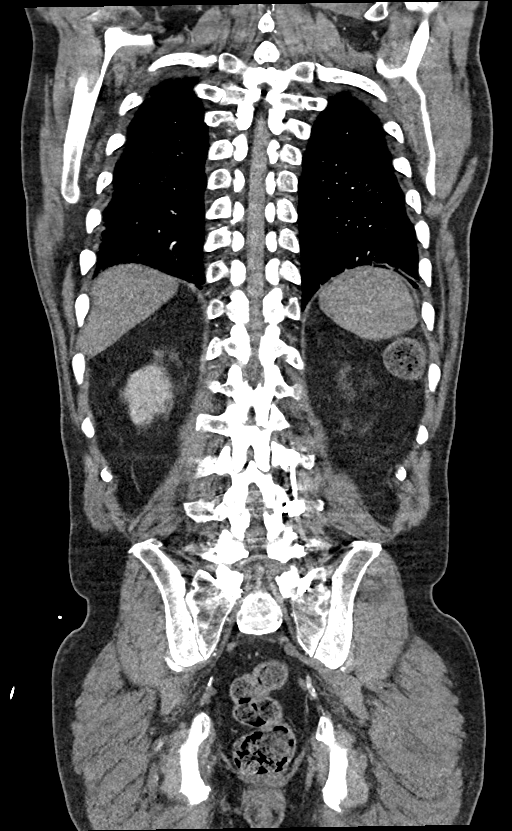
[im 70/140  soft-tissue]
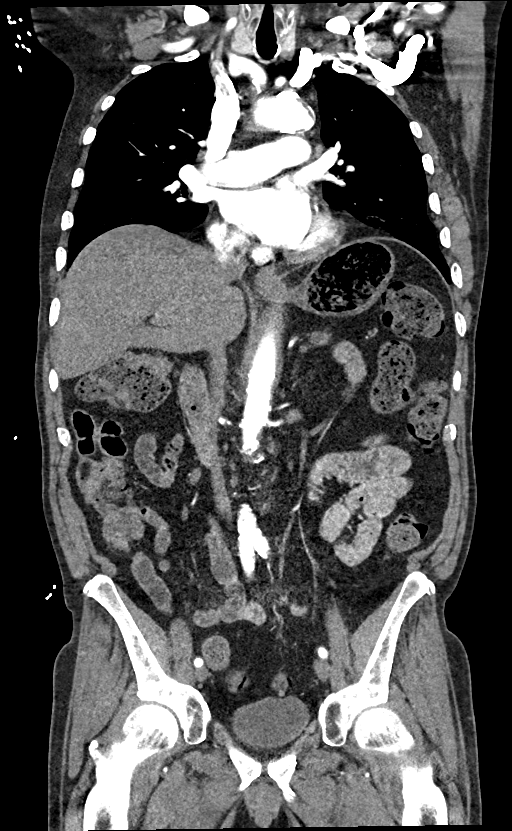
[im 105/140  soft-tissue]
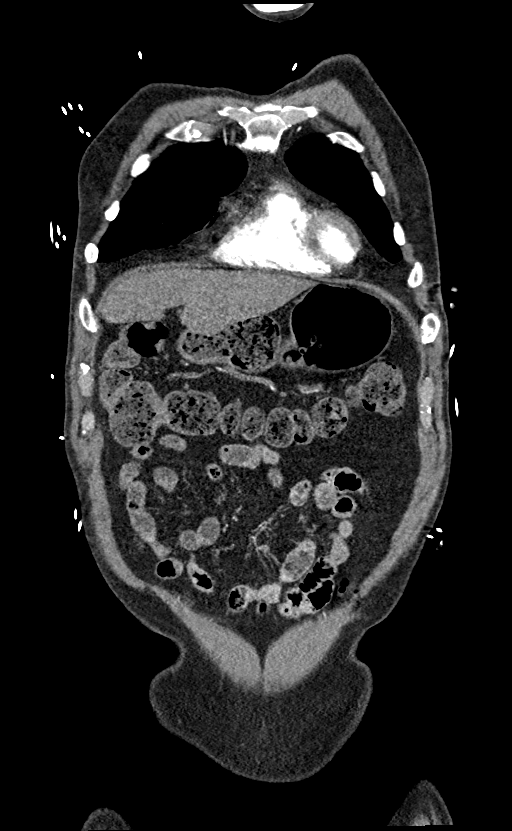

[14 of 46 positions shown; findings below may reference images not displayed]

Multidetector CT imaging through the chest, abdomen and pelvis was
performed using the standard protocol during bolus administration of
intravenous contrast. Multiplanar reconstructed images and MIPs were
obtained and reviewed to evaluate the vascular anatomy.

CONTRAST:  80 mL OMNIPAQUE IOHEXOL 350 MG/ML SOLN
FINDINGS: CTA CHEST FINDINGS

Cardiovascular: Preferential opacification of the thoracic aorta. No
evidence of thoracic aortic aneurysm or dissection. Normal heart
size. No pericardial effusion. There is a small penetrating ulcer
along the lateral aspect of the distal aortic arch. Calcific aortic
and coronary atherosclerosis noted.

Mediastinum/Nodes: No enlarged mediastinal, hilar, or axillary lymph
nodes. Thyroid gland, trachea, and esophagus demonstrate no
significant findings.

Lungs/Pleura: Lungs are clear. No pleural effusion or pneumothorax.

Musculoskeletal: No acute or focal bony abnormality.

Review of the MIP images confirms the above findings.

CTA ABDOMEN AND PELVIS FINDINGS

VASCULAR

Aorta: Normal caliber aorta without aneurysm, dissection, vasculitis
or significant stenosis.

Celiac: Patent without aneurysm, dissection or evidence of
vasculitis. The artery appears markedly stenotic at its takeoff from
the aorta.

SMA: Patent without evidence of aneurysm, dissection, vasculitis or
significant stenosis.

Renals: Both renal arteries are patent without evidence of aneurysm,
dissection, vasculitis, fibromuscular dysplasia or significant
stenosis. Accessory renal artery on the left to the lower pole is
noted.

IMA: Patent without evidence of aneurysm, dissection, vasculitis or
significant stenosis.

Inflow: Patent without evidence of aneurysm, dissection, vasculitis
or significant stenosis.

Veins: No obvious venous abnormality within the limitations of this
arterial phase study.

Review of the MIP images confirms the above findings.

NON-VASCULAR

Hepatobiliary: No focal liver abnormality is seen. Status post
cholecystectomy. No biliary dilatation. There is mild fatty
infiltration of the liver.

Pancreas: Unremarkable. No pancreatic ductal dilatation or
surrounding inflammatory changes.

Spleen: Normal in size without focal abnormality.

Adrenals/Urinary Tract: Adrenal glands are unremarkable. Kidneys are
normal, without renal calculi, focal lesion, or hydronephrosis.
Bladder is unremarkable.

Stomach/Bowel: Stomach is within normal limits. Appendix appears
normal. No evidence of bowel wall thickening, distention, or
inflammatory changes. Moderately large stool burden throughout the
colon noted.

Lymphatic: No lymphadenopathy.

Reproductive: Brachytherapy seeds in the prostate noted.

Other: None.

Musculoskeletal: No acute or focal abnormality. The patient is
status post L3-S1 fusion.

Review of the MIP images confirms the above findings.
IMPRESSION: No acute abnormality chest, abdomen or pelvis. Negative for aortic
dissection or aneurysm.

The celiac axis opacifies but there appears to be high-grade
stenosis at its takeoff from the aorta.

Calcific aortic and coronary atherosclerosis. Small penetrating
ulcer aortic arch.

Fatty infiltration of the liver.

Moderately large colonic stool burden throughout.

## 2020-09-12 MED ORDER — BISACODYL 5 MG PO TBEC: 5.0000 mg | DELAYED_RELEASE_TABLET | Freq: Every day | ORAL | Status: DC | PRN

## 2020-09-12 MED ORDER — SODIUM CHLORIDE 0.9 % IV BOLUS
500.0000 mL | Freq: Once | INTRAVENOUS | Status: AC
  Administered 2020-09-12: 500 mL via INTRAVENOUS

## 2020-09-12 MED ORDER — ENOXAPARIN SODIUM 40 MG/0.4ML IJ SOSY
40.0000 mg | PREFILLED_SYRINGE | INTRAMUSCULAR | Status: DC
  Administered 2020-09-12: 40 mg via SUBCUTANEOUS
  Filled 2020-09-12: qty 0.4

## 2020-09-12 MED ORDER — INSULIN ASPART 100 UNIT/ML IJ SOLN
0.0000 [IU] | Freq: Three times a day (TID) | INTRAMUSCULAR | Status: DC
  Administered 2020-09-13: 2 [IU] via SUBCUTANEOUS

## 2020-09-12 MED ORDER — PRAVASTATIN SODIUM 40 MG PO TABS: 40.0000 mg | ORAL_TABLET | Freq: Every day | ORAL | Status: DC

## 2020-09-12 MED ORDER — GABAPENTIN 600 MG PO TABS: 600.0000 mg | ORAL_TABLET | Freq: Two times a day (BID) | ORAL | Status: DC

## 2020-09-12 MED ORDER — IOHEXOL 350 MG/ML SOLN
80.0000 mL | Freq: Once | INTRAVENOUS | Status: AC | PRN
  Administered 2020-09-12: 80 mL via INTRAVENOUS

## 2020-09-12 MED ORDER — SODIUM ZIRCONIUM CYCLOSILICATE 10 G PO PACK
10.0000 g | PACK | Freq: Once | ORAL | Status: AC
  Administered 2020-09-12: 10 g via ORAL
  Filled 2020-09-12: qty 1

## 2020-09-12 MED ORDER — SODIUM BICARBONATE 8.4 % IV SOLN
75.0000 meq | INTRAVENOUS | Status: DC
  Filled 2020-09-12: qty 100

## 2020-09-12 MED ORDER — PREGABALIN 75 MG PO CAPS: 75.0000 mg | ORAL_CAPSULE | Freq: Three times a day (TID) | ORAL | Status: DC

## 2020-09-12 MED ORDER — SODIUM CHLORIDE 0.9 % IV SOLN: INTRAVENOUS | Status: AC

## 2020-09-12 MED ORDER — INSULIN ASPART 100 UNIT/ML IJ SOLN: 0.0000 [IU] | Freq: Every day | INTRAMUSCULAR | Status: DC

## 2020-09-12 MED ORDER — CLOPIDOGREL BISULFATE 75 MG PO TABS
75.0000 mg | ORAL_TABLET | Freq: Every day | ORAL | Status: DC
  Administered 2020-09-13: 75 mg via ORAL
  Filled 2020-09-12: qty 1

## 2020-09-12 MED ORDER — ONDANSETRON HCL 4 MG/2ML IJ SOLN: 4.0000 mg | Freq: Four times a day (QID) | INTRAMUSCULAR | Status: DC | PRN

## 2020-09-12 MED ORDER — SODIUM CHLORIDE 0.45 % IV SOLN
INTRAVENOUS | Status: DC
  Filled 2020-09-12: qty 75

## 2020-09-12 MED ORDER — POLYETHYLENE GLYCOL 3350 17 G PO PACK: 17.0000 g | PACK | Freq: Every day | ORAL | Status: DC | PRN

## 2020-09-12 MED ORDER — METOPROLOL SUCCINATE ER 25 MG PO TB24
25.0000 mg | ORAL_TABLET | Freq: Every day | ORAL | Status: DC
  Filled 2020-09-12: qty 1

## 2020-09-12 MED ORDER — ONDANSETRON HCL 4 MG PO TABS: 4.0000 mg | ORAL_TABLET | Freq: Four times a day (QID) | ORAL | Status: DC | PRN

## 2020-09-12 MED ORDER — PREGABALIN 75 MG PO CAPS
75.0000 mg | ORAL_CAPSULE | Freq: Two times a day (BID) | ORAL | Status: DC
  Administered 2020-09-12 – 2020-09-13 (×2): 75 mg via ORAL
  Filled 2020-09-12 (×2): qty 1

## 2020-09-12 MED ORDER — ACETAMINOPHEN 650 MG RE SUPP: 650.0000 mg | Freq: Four times a day (QID) | RECTAL | Status: DC | PRN

## 2020-09-12 MED ORDER — GABAPENTIN 600 MG PO TABS
600.0000 mg | ORAL_TABLET | Freq: Every day | ORAL | Status: DC
  Administered 2020-09-12: 600 mg via ORAL
  Filled 2020-09-12: qty 1

## 2020-09-12 MED ORDER — INSULIN ASPART 100 UNIT/ML IJ SOLN
5.0000 [IU] | Freq: Once | INTRAMUSCULAR | Status: AC
  Administered 2020-09-12: 5 [IU] via INTRAVENOUS

## 2020-09-12 MED ORDER — DEXTROSE 50 % IV SOLN
1.0000 | Freq: Once | INTRAVENOUS | Status: AC
  Administered 2020-09-12: 50 mL via INTRAVENOUS
  Filled 2020-09-12: qty 50

## 2020-09-12 MED ORDER — SENNA 8.6 MG PO TABS
1.0000 | ORAL_TABLET | Freq: Two times a day (BID) | ORAL | Status: DC
  Administered 2020-09-12 – 2020-09-13 (×2): 8.6 mg via ORAL
  Filled 2020-09-12 (×2): qty 1

## 2020-09-12 MED ORDER — ACETAMINOPHEN 325 MG PO TABS: 650.0000 mg | ORAL_TABLET | Freq: Four times a day (QID) | ORAL | Status: DC | PRN

## 2020-09-12 MED ORDER — ROPINIROLE HCL 1 MG PO TABS
4.0000 mg | ORAL_TABLET | Freq: Every day | ORAL | Status: DC
  Administered 2020-09-12: 4 mg via ORAL
  Filled 2020-09-12: qty 4

## 2020-09-12 NOTE — ED Notes (Signed)
Handoff report given to [facility] 

## 2020-09-12 NOTE — ED Provider Notes (Signed)
[facility] EMERGENCY DEPT Provider Note   CSN: [id number] Arrival date & time: 09/12/20  1124     History Chief Complaint  Patient presents with   Dizziness    Harry Zamora is a 80 y.o. male.  HPI     Has had a few months of fatigue, episodes of dizziness, nausea  Today had lightheadedness and nausea, and vomiting x 2 Middle of abdomen with pain, started 15 minutes ago, moderate-severe  Had syncopal event 5/6, went to ED but didn't wait No syncope since then Episodes of lightheadedness like going to have syncopal episode, occurring just about every day, lasts 5-10 minutes. Has some associated dyspnea with the lightheadedness. No chest pain or palpitations. Has associated nausea.  Once a day will have blurred vision.  No numbness/weakness Right arm shaking for a few months, once a day, doesn't looe consciousness, once or twice a day for about 5 minutes, is able to stop it by holding with other arm.  No hx of seizures.    Started plavix over the last week, nitro prn  Mailed in heart monitoring at the beginning of the week.  Not sure of results. Has cath scheduled for 7/5    Past Medical History:  Diagnosis Date   Anemia    as an infant   Arthritis    Back pain    Depression    Disc displacement, lumbar    Gynecomastia    History of kidney stones    Hyperlipidemia    Hypertension    no longer on medications   Hypertriglyceridemia    Insomnia    Low back pain    Lumbar radiculopathy    Lumbar stenosis    OA (osteoarthritis)    Polyneuropathy in diabetes(357.2)    Prostate cancer ([place]) 2003   Restless legs    Ringing in ears    RLS (restless legs syndrome)    Type 2 diabetes mellitus ([place])     Patient Active Problem List   Diagnosis Date Noted   Hyperkalemia 09/12/2020   Acute renal failure superimposed on stage 3a chronic kidney disease ([place]) 09/12/2020   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   Type 2 diabetes mellitus ([place])     Syncope 08/07/2020   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   PAD (peripheral artery disease) ([place]) 08/30/2019   Spondylolisthesis of lumbar region 07/13/2019   Spinal stenosis of lumbar region with neurogenic claudication 10/02/2016   Polyneuropathy in diabetes(357.2) 11/10/2013    Past Surgical History:  Procedure Laterality Date   ABDOMINAL EXPOSURE N/A 07/13/2019   Procedure: ABDOMINAL EXPOSURE;  Surgeon: Serafina Mitchell, MD;  Location: [facility];  Service: Vascular;  Laterality: N/A;  anterior approach   ANTERIOR LAT LUMBAR FUSION Right 07/13/2019   Procedure: Right Lumbar 3-4 Lumbar 4-5 Anterolateral lumbar interbody fusion;  Surgeon: Erline Levine, MD;  Location: [facility];  Service: Neurosurgery;  Laterality: Right;   ANTERIOR LUMBAR FUSION N/A 07/13/2019   Procedure: Lumbar Five-Sacral One Anterior lumbar interbody fusion;  Surgeon: Erline Levine, MD;  Location: [facility];  Service: Neurosurgery;  Laterality: N/A;  Anterior approach   CHOLECYSTECTOMY     COLONOSCOPY     EYE SURGERY Bilateral    GALLBLADDER SURGERY     HEMORRHOID SURGERY  1983   LUMBAR LAMINECTOMY/DECOMPRESSION MICRODISCECTOMY N/A 10/02/2016   Procedure: L3 to S1 Laminectomy;  Surgeon: Erline Levine, MD;  Location: [facility];  Service: Neurosurgery;  Laterality: N/A;  L3 to S1 Laminectomy   LUMBAR  LAMINECTOMY/DECOMPRESSION MICRODISCECTOMY Bilateral 09/07/2019   Procedure: Bilateral Lumbar Five - Sacral One Foraminotomy;  Surgeon: Erline Levine, MD;  Location: [facility];  Service: Neurosurgery;  Laterality: Bilateral;  posterior   LUMBAR PERCUTANEOUS PEDICLE SCREW 3 LEVEL N/A 07/13/2019   Procedure: Percutaneous pedicle screw fixation from Lumbar 3 to Sacral 1;  Surgeon: Erline Levine, MD;  Location: [facility];  Service: Neurosurgery;  Laterality: N/A;   MENISCUS REPAIR Right    PROSTATE SURGERY     SHOULDER SURGERY Left 01/2011   rotator cuff       Family History  Problem Relation Age of Onset   Arthritis Mother     Hypertension Mother    Diabetes Mother    Heart attack Mother    Cancer Father     Social History   Tobacco Use   Smoking status: Light Smoker    Pack years: 0.00    Types: Cigars   Smokeless tobacco: Never   Tobacco comments:    occasional  Vaping Use   Vaping Use: Never used  Substance Use Topics   Alcohol use: Not Currently    Comment: social-beer   Drug use: No    Home Medications Prior to Admission medications   Medication Sig Start Date End Date Taking? Authorizing Provider  clopidogrel (PLAVIX) 75 MG tablet Take 1 tablet (75 mg total) by mouth daily. 08/08/20  Yes Patwardhan, [name], MD  Dulaglutide (TRULICITY) 1.5 CH/8.5ID SOPN Inject 1.5 mg into the skin every Wednesday.   Yes [provider]  FARXIGA 10 MG TABS tablet Take 10 mg by mouth daily.  05/23/19  Yes [provider]  gabapentin (NEURONTIN) 600 MG tablet Take 600 mg by mouth 2 (two) times daily.   Yes [provider]  lovastatin (MEVACOR) 40 MG tablet Take 40 mg by mouth at bedtime.    Yes [provider]  meloxicam (MOBIC) 15 MG tablet Take 15 mg by mouth daily. 08/26/19  Yes [provider]  metFORMIN (GLUCOPHAGE) 500 MG tablet Take 1,000 mg by mouth 2 (two) times daily with a meal.  08/03/16  Yes [provider]  metoprolol succinate (TOPROL-XL) 25 MG 24 hr tablet TAKE 1 TABLET(25 MG) BY MOUTH DAILY Patient taking differently: Take 25 mg by mouth daily. 09/03/20  Yes Patwardhan, Manish J, MD  nitroGLYCERIN (NITROSTAT) 0.4 MG SL tablet Place 1 tablet (0.4 mg total) under the tongue every 5 (five) minutes as needed for chest pain. 09/02/20 12/01/20 Yes Patwardhan, Manish J, MD  pregabalin (LYRICA) 75 MG capsule Take 75 mg by mouth 3 (three) times daily. 08/09/20  Yes [provider]  rOPINIRole (REQUIP) 4 MG tablet Take 4 mg by mouth at bedtime.   Yes [provider]  vitamin B-12 (CYANOCOBALAMIN) 500 MCG tablet Take 500 mcg by mouth daily.   Yes  [provider]  diclofenac (VOLTAREN) 75 MG EC tablet Take 75 mg by mouth 2 (two) times daily.    [provider]    Allergies    Levaquin [levofloxacin in d5w], Niaspan [niacin er], and Tramadol  Review of Systems   Review of Systems  Constitutional:  Positive for fatigue. Negative for fever.  HENT:  Negative for sore throat.   Eyes:  Negative for visual disturbance.  Respiratory:  Positive for shortness of breath. Negative for cough.   Cardiovascular:  Negative for chest pain.  Gastrointestinal:  Positive for nausea and vomiting. Negative for abdominal pain, blood in stool, constipation (once in a while) and  diarrhea.  Genitourinary:  Negative for difficulty urinating.  Musculoskeletal:  Negative for back pain and neck stiffness.  Skin:  Negative for rash.  Neurological:  Positive for light-headedness and headaches. Negative for syncope, facial asymmetry, speech difficulty, weakness and numbness. Seizures: once a day right arm "jumps".  Physical Exam Updated Vital Signs BP 136/69 (BP Location: Right Arm)   Pulse 74   Temp 98 F (36.7 C) (Oral)   Resp 18   Ht 6\' 2"  (1.88 m)   Wt 85.7 kg   SpO2 97%   BMI 24.27 kg/m   Physical Exam Vitals and nursing note reviewed.  Constitutional:      General: He is not in acute distress.    Appearance: He is well-developed. He is not diaphoretic.  HENT:     Head: Normocephalic and atraumatic.  Eyes:     Conjunctiva/sclera: Conjunctivae normal.  Cardiovascular:     Rate and Rhythm: Normal rate and regular rhythm.     Heart sounds: Normal heart sounds. No murmur heard.   No friction rub. No gallop.  Pulmonary:     Effort: Pulmonary effort is normal. No respiratory distress.     Breath sounds: Normal breath sounds. No wheezing or rales.  Abdominal:     General: There is no distension.     Palpations: Abdomen is soft.     Tenderness: There is no abdominal tenderness. There is no guarding.  Musculoskeletal:      Cervical back: Normal range of motion.  Skin:    General: Skin is warm and dry.  Neurological:     Mental Status: He is alert and oriented to person, place, and time.    ED Results / Procedures / Treatments   Labs (all labs ordered are listed, but only abnormal results are displayed) Labs Reviewed  COMPREHENSIVE METABOLIC PANEL - Abnormal; Notable for the following components:      Result Value   Potassium 6.1 (*)    Glucose, Bld 156 (*)    BUN 36 (*)    Creatinine, Ser 1.62 (*)    Total Protein 8.7 (*)    GFR, Estimated 42 (*)    All other components within normal limits  CBC WITH DIFFERENTIAL/PLATELET - Abnormal; Notable for the following components:   WBC 11.2 (*)    Neutro Abs 8.3 (*)    Monocytes Absolute 1.2 (*)    All other components within normal limits  URINALYSIS, ROUTINE W REFLEX MICROSCOPIC - Abnormal; Notable for the following components:   Specific Gravity, Urine 1.036 (*)    Glucose, UA >1,000 (*)    Protein, ur TRACE (*)    All other components within normal limits  BASIC METABOLIC PANEL - Abnormal; Notable for the following components:   Potassium 6.5 (*)    Glucose, Bld 134 (*)    BUN 34 (*)    Creatinine, Ser 1.54 (*)    GFR, Estimated 45 (*)    All other components within normal limits  BASIC METABOLIC PANEL - Abnormal; Notable for the following components:   Sodium 131 (*)    Chloride 96 (*)    Glucose, Bld 121 (*)    BUN 31 (*)    Creatinine, Ser 1.40 (*)    Calcium 8.7 (*)    GFR, Estimated 50 (*)    All other components within normal limits  RESP PANEL BY RT-PCR (FLU A&B, COVID) ARPGX2  LIPASE, BLOOD  GLUCOSE, CAPILLARY  CREATININE, URINE, RANDOM  SODIUM, URINE, RANDOM  CBC  BASIC METABOLIC PANEL  TROPONIN I (HIGH SENSITIVITY)  TROPONIN I (HIGH SENSITIVITY)    EKG None  Radiology CT Head Wo Contrast  Result Date: 09/12/2020 CLINICAL DATA:  Dizziness. EXAM: CT HEAD WITHOUT CONTRAST TECHNIQUE: Contiguous axial images were obtained  from the base of the skull through the vertex without intravenous contrast. COMPARISON:  None. FINDINGS: Brain: There is mild cerebral atrophy with widening of the extra-axial spaces and ventricular dilatation. There are areas of decreased attenuation within the white matter tracts of the supratentorial brain, consistent with microvascular disease changes. Vascular: No hyperdense vessel or unexpected calcification. Skull: Normal. Negative for fracture or focal lesion. Sinuses/Orbits: No acute finding. Other: None. IMPRESSION: 1. Generalized cerebral atrophy. 2. No acute intracranial abnormality. Electronically Signed   By: Virgina Norfolk M.D.   On: 09/12/2020 14:57   CT Angio Chest/Abd/Pel for Dissection W and/or Wo Contrast  Result Date: 09/12/2020 CLINICAL DATA:  Dizziness, weakness, intermittent nausea and vomiting for the past month. EXAM: CT ANGIOGRAPHY CHEST, ABDOMEN AND PELVIS TECHNIQUE: Non-contrast CT of the chest was initially obtained. Multidetector CT imaging through the chest, abdomen and pelvis was performed using the standard protocol during bolus administration of intravenous contrast. Multiplanar reconstructed images and MIPs were obtained and reviewed to evaluate the vascular anatomy. CONTRAST:  80 mL OMNIPAQUE IOHEXOL 350 MG/ML SOLN COMPARISON:  CT abdomen and pelvis 08/15/2019. FINDINGS: CTA CHEST FINDINGS Cardiovascular: Preferential opacification of the thoracic aorta. No evidence of thoracic aortic aneurysm or dissection. Normal heart size. No pericardial effusion. There is a small penetrating ulcer along the lateral aspect of the distal aortic arch. Calcific aortic and coronary atherosclerosis noted. Mediastinum/Nodes: No enlarged mediastinal, hilar, or axillary lymph nodes. Thyroid gland, trachea, and esophagus demonstrate no significant findings. Lungs/Pleura: Lungs are clear. No pleural effusion or pneumothorax. Musculoskeletal: No acute or focal bony abnormality. Review of the MIP  images confirms the above findings. CTA ABDOMEN AND PELVIS FINDINGS VASCULAR Aorta: Normal caliber aorta without aneurysm, dissection, vasculitis or significant stenosis. Celiac: Patent without aneurysm, dissection or evidence of vasculitis. The artery appears markedly stenotic at its takeoff from the aorta. SMA: Patent without evidence of aneurysm, dissection, vasculitis or significant stenosis. Renals: Both renal arteries are patent without evidence of aneurysm, dissection, vasculitis, fibromuscular dysplasia or significant stenosis. Accessory renal artery on the left to the lower pole is noted. IMA: Patent without evidence of aneurysm, dissection, vasculitis or significant stenosis. Inflow: Patent without evidence of aneurysm, dissection, vasculitis or significant stenosis. Veins: No obvious venous abnormality within the limitations of this arterial phase study. Review of the MIP images confirms the above findings. NON-VASCULAR Hepatobiliary: No focal liver abnormality is seen. Status post cholecystectomy. No biliary dilatation. There is mild fatty infiltration of the liver. Pancreas: Unremarkable. No pancreatic ductal dilatation or surrounding inflammatory changes. Spleen: Normal in size without focal abnormality. Adrenals/Urinary Tract: Adrenal glands are unremarkable. Kidneys are normal, without renal calculi, focal lesion, or hydronephrosis. Bladder is unremarkable. Stomach/Bowel: Stomach is within normal limits. Appendix appears normal. No evidence of bowel wall thickening, distention, or inflammatory changes. Moderately large stool burden throughout the colon noted. Lymphatic: No lymphadenopathy. 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 Other: None. Musculoskeletal: No acute or focal abnormality. The patient is status post L3-S1 fusion. Review of the MIP images confirms the above findings. IMPRESSION: No acute abnormality chest, abdomen or pelvis. Negative for aortic dissection or aneurysm.  The celiac axis opacifies but there appears to be high-grade stenosis at its takeoff from the aorta. Calcific aortic and  coronary atherosclerosis. Small penetrating ulcer aortic arch. Fatty infiltration of the liver. Moderately large colonic stool burden throughout. Electronically Signed   By: Inge Rise M.D.   On: 09/12/2020 15:16    Procedures .Critical Care  Date/Time: 09/13/2020 12:50 AM Performed by: Gareth Morgan, MD Authorized by: Gareth Morgan, MD   Critical care provider statement:    Critical care time (minutes):  45   Critical care was time spent personally by me on the following activities:  Discussions with consultants, evaluation of patient's response to treatment, examination of patient, ordering and performing treatments and interventions, ordering and review of laboratory studies, ordering and review of radiographic studies, pulse oximetry, re-evaluation of patient's condition, obtaining history from patient or surrogate and review of old charts   Medications Ordered in ED Medications  sodium bicarbonate injection 75 mEq (has no administration in time range)  pravastatin (PRAVACHOL) tablet 40 mg (has no administration in time range)  metoprolol succinate (TOPROL-XL) 24 hr tablet 25 mg (0 mg Oral Duplicate 2/40/97 3532)  clopidogrel (PLAVIX) tablet 75 mg (has no administration in time range)  rOPINIRole (REQUIP) tablet 4 mg (4 mg Oral Given 09/12/20 2148)  insulin aspart (novoLOG) injection 0-9 Units (has no administration in time range)  insulin aspart (novoLOG) injection 0-5 Units (100 Units Subcutaneous Not Given 09/12/20 2143)  enoxaparin (LOVENOX) injection 40 mg (40 mg Subcutaneous Given 09/12/20 2147)  0.9 %  sodium chloride infusion ( Intravenous New Bag/Given 09/12/20 2146)  acetaminophen (TYLENOL) tablet 650 mg (has no administration in time range)    Or  acetaminophen (TYLENOL) suppository 650 mg (has no administration in time range)  senna (SENOKOT) tablet  8.6 mg (8.6 mg Oral Given 09/12/20 2147)  polyethylene glycol (MIRALAX / GLYCOLAX) packet 17 g (has no administration in time range)  bisacodyl (DULCOLAX) EC tablet 5 mg (has no administration in time range)  ondansetron (ZOFRAN) tablet 4 mg (has no administration in time range)    Or  ondansetron (ZOFRAN) injection 4 mg (has no administration in time range)  gabapentin (NEURONTIN) tablet 600 mg (600 mg Oral Given 09/12/20 2147)  pregabalin (LYRICA) capsule 75 mg (75 mg Oral Given 09/12/20 2147)  sodium chloride 0.9 % bolus 500 mL (0 mLs Intravenous Stopped 09/12/20 1836)  iohexol (OMNIPAQUE) 350 MG/ML injection 80 mL (80 mLs Intravenous Contrast Given 09/12/20 1351)  sodium zirconium cyclosilicate (LOKELMA) packet 10 g (10 g Oral Given 09/12/20 1459)  insulin aspart (novoLOG) injection 5 Units (5 Units Intravenous Given 09/12/20 1455)  dextrose 50 % solution 50 mL (50 mLs Intravenous Given 09/12/20 1454)    ED Course  I have reviewed the triage vital signs and the nursing notes.  Pertinent labs & imaging results that were available during my care of the patient were reviewed by me and considered in my medical decision making (see chart for details).    MDM Rules/Calculators/A&P                          80yo male with history of DM, PAD< spinal stenosis with history of prior surgeries now under medical clearance evaluation with another surgery pending, seeing Cardiology Dr. Virgina Jock for syncopal episode and episodes of lightheadedness with plan for cardiac catheterization on 7/5, who presents with concern for lightheadedness, nausea and abdominal pain.   CT dissection study does not show signs of AAA or dissection.  CT does show aortic ulcer, discussed with PA-C Ryan of CT surgery who spoke with Dr.  Bartle, no acute CT surgery recommendations indicated, recommend 28mo CTA, follow up with CT surgery as an outpatient.  Suspect abdominal discomfort due to constipation. Narrowing of celiac vessel but  normal reconstitution and flow without signs of ischemia and do not feel this is etiology of symptoms.   Labs significant for CKD with hyperkalemia. K 6.1 and on recheck 6.5. No acute ECG changes. Given insulin, dextrose, lokelma, fluid.  Will admit for continued care.     Final Clinical Impression(s) / ED Diagnoses Final diagnoses:  Hyperkalemia  Lightheadedness  Generalized abdominal pain  AKI (acute kidney injury) [facility])    Rx / DC Orders ED Discharge Orders     None        Gareth Morgan, MD 09/13/20 [phone]

## 2020-09-12 NOTE — ED Triage Notes (Signed)
Pt has been experiencing dizziness, generalized weakness and intermittent nausea and omiting for the past month. Pt comes to the ED today because of emesis(  2 counts) and increasing generalized weakness.  Pt stated that he also has had blurry vision but is on currently experiencing any problems.

## 2020-09-12 NOTE — ED Notes (Signed)
Care handoff given to [facility] at [place].

## 2020-09-12 NOTE — H&P (Signed)
History and Physical    Harry Zamora TMH:[id number] DOB: 02-06-1941 DOA: 09/12/2020  PCP: [name], MD   Patient coming from: Home   Chief Complaint: Nausea, vomiting   HPI: Harry Zamora is a 79 y.o. male with medical history significant for type 2 diabetes mellitus, peripheral arterial disease, lumbar spinal stenosis, chronic kidney disease stage IIIa, and approximately 2 months of intermittent nausea and nonbloody vomiting that prompted his presentation to the ED today.  Patient reports recurrent episodes of nausea and vomiting over the past few weeks, was having worse symptoms today, and came in for evaluation.  He has not had any pain or fevers associated with this.  He denies any diarrhea and reports that he has been constipated.  He denies any chest pain but has been experiencing some occasional dyspnea, dizziness, and presyncope, and is following with cardiology for this, reports that he has been evaluated with ambulatory heart monitor, and is planned for cardiac catheterization on 09/20/2020.  Patient wonders if his nausea could be related to constipation.  [facility] ED Course: Upon arrival to the ED, patient is found to be afebrile, saturating well on room air, and with stable blood pressure.  EKG features sinus rhythm.  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  Chemistry panel features a creatinine 1.62 and potassium 6.1.  CBC with mild leukocytosis.  CT surgery was consulted by the ED physician and the patient was treated with saline bolus, Lokelma, IV bicarbonate, and insulin with dextrose.  He was transferred to [facility] for ongoing evaluation and management.  Review of Systems:  All other systems reviewed and apart from HPI, are negative.  Past Medical History:  Diagnosis Date   Anemia    as an infant    Arthritis    Back pain    Depression    Disc displacement, lumbar    Gynecomastia    History of kidney stones    Hyperlipidemia    Hypertension    no longer on medications   Hypertriglyceridemia    Insomnia    Low back pain    Lumbar radiculopathy    Lumbar stenosis    OA (osteoarthritis)    Polyneuropathy in diabetes(357.2)    Prostate cancer ([place]) 2003   Restless legs    Ringing in ears    RLS (restless legs syndrome)    Type 2 diabetes mellitus ([place])     Past Surgical History:  Procedure Laterality Date   ABDOMINAL EXPOSURE N/A 07/13/2019   Procedure: ABDOMINAL EXPOSURE;  Surgeon: Serafina Mitchell, MD;  Location: [place];  Service: Vascular;  Laterality: N/A;  anterior approach   ANTERIOR LAT LUMBAR FUSION Right 07/13/2019   Procedure: Right Lumbar 3-4 Lumbar 4-5 Anterolateral lumbar interbody fusion;  Surgeon: Erline Levine, MD;  Location: [place];  Service: Neurosurgery;  Laterality: Right;   ANTERIOR LUMBAR FUSION N/A 07/13/2019   Procedure: Lumbar Five-Sacral One Anterior lumbar interbody fusion;  Surgeon: Erline Levine, MD;  Location: [place];  Service: Neurosurgery;  Laterality: N/A;  Anterior approach   CHOLECYSTECTOMY     COLONOSCOPY     EYE SURGERY Bilateral    GALLBLADDER SURGERY     HEMORRHOID SURGERY  1983   LUMBAR LAMINECTOMY/DECOMPRESSION MICRODISCECTOMY N/A 10/02/2016   Procedure: L3 to S1 Laminectomy;  Surgeon: Erline Levine, MD;  Location: [place];  Service:  Neurosurgery;  Laterality: N/A;  L3 to S1 Laminectomy   LUMBAR LAMINECTOMY/DECOMPRESSION MICRODISCECTOMY Bilateral 09/07/2019   Procedure: Bilateral Lumbar Five - Sacral One Foraminotomy;  Surgeon: Erline Levine, MD;  Location: [place];  Service: Neurosurgery;  Laterality: Bilateral;  posterior   LUMBAR PERCUTANEOUS PEDICLE SCREW 3 LEVEL N/A 07/13/2019   Procedure: Percutaneous pedicle screw fixation from Lumbar 3 to Sacral 1;  Surgeon: Erline Levine, MD;  Location: [place];  Service: Neurosurgery;  Laterality: N/A;    MENISCUS REPAIR Right    PROSTATE SURGERY     SHOULDER SURGERY Left 01/2011   rotator cuff    Social History:   reports that he has been smoking cigars. He has never used smokeless tobacco. He reports previous alcohol use. He reports that he does not use drugs.  Allergies  Allergen Reactions   Levaquin [Levofloxacin In D5w] Rash   Niaspan [Niacin Er] Rash and Other (See Comments)    Flushing reaction   Tramadol Rash    Family History  Problem Relation Age of Onset   Arthritis Mother    Hypertension Mother    Diabetes Mother    Heart attack Mother    Cancer Father      Prior to Admission medications   Medication Sig Start Date End Date Taking? Authorizing Provider  clopidogrel (PLAVIX) 75 MG tablet Take 1 tablet (75 mg total) by mouth daily. 08/08/20  Yes Patwardhan, [name], MD  Dulaglutide (TRULICITY) 1.5 PF/7.9KW SOPN Inject 1.5 mg into the skin every Wednesday.   Yes [provider]  FARXIGA 10 MG TABS tablet Take 10 mg by mouth daily.  05/23/19  Yes [provider]  gabapentin (NEURONTIN) 600 MG tablet Take 600 mg by mouth 2 (two) times daily.   Yes [provider]  lovastatin (MEVACOR) 40 MG tablet Take 40 mg by mouth at bedtime.    Yes [provider]  meloxicam (MOBIC) 15 MG tablet Take 15 mg by mouth daily. 08/26/19  Yes [provider]  metFORMIN (GLUCOPHAGE) 500 MG tablet Take 1,000 mg by mouth 2 (two) times daily with a meal.  08/03/16  Yes [provider]  metoprolol succinate (TOPROL-XL) 25 MG 24 hr tablet TAKE 1 TABLET(25 MG) BY MOUTH DAILY Patient taking differently: Take 25 mg by mouth daily. 09/03/20  Yes Patwardhan, Manish J, MD  nitroGLYCERIN (NITROSTAT) 0.4 MG SL tablet Place 1 tablet (0.4 mg total) under the tongue every 5 (five) minutes as needed for chest pain. 09/02/20 12/01/20 Yes Patwardhan, Manish J, MD  pregabalin (LYRICA) 75 MG capsule Take 75 mg by mouth 3 (three) times daily. 08/09/20  Yes [provider]  rOPINIRole (REQUIP) 4 MG tablet Take 4 mg by mouth at bedtime.   Yes [provider]  vitamin B-12 (CYANOCOBALAMIN) 500 MCG tablet Take 500 mcg by mouth daily.   Yes [provider]  diclofenac (VOLTAREN) 75 MG EC tablet Take 75 mg by mouth 2 (two) times daily.    [provider]    Physical Exam: Vitals:   09/12/20 1435 09/12/20 1500 09/12/20 1531 09/12/20 1945  BP: 128/63 116/71 116/71 136/69  Pulse: 85  78 74  Resp: 20 14 18 18   Temp:    98 F (36.7 C)  TempSrc:    Oral  SpO2: 97%  96% 97%  Weight:      Height:        Constitutional: NAD, calm  Eyes: PERTLA, lids and conjunctivae normal ENMT: Mucous membranes are moist.  Posterior pharynx clear of any exudate or lesions.   Neck: supple, no masses  Respiratory: no wheezing, no crackles. No accessory muscle use.  Cardiovascular: S1 & S2 heard, regular rate and rhythm. No extremity edema.  Abdomen: No distension, no tenderness, soft. Bowel sounds active.  Musculoskeletal: no clubbing / cyanosis. No joint deformity upper and lower extremities.   Skin: no significant rashes, lesions, ulcers. Warm, dry, well-perfused. Neurologic: CN 2-12 grossly intact. Sensation intact. Moving all extremities.  Psychiatric: Alert and oriented to person, place, and situation. Pleasant and cooperative.    Labs and Imaging on Admission: I have personally reviewed following labs and imaging studies  CBC: Recent Labs  Lab 09/12/20 1205  WBC 11.2*  NEUTROABS 8.3*  HGB 14.0  HCT 44.3  MCV 97.6  PLT 202   Basic Metabolic Panel: Recent Labs  Lab 09/12/20 1205 09/12/20 1327 09/12/20 2020  NA 139 136 131*  K 6.1* 6.5* 5.1  CL 100 100 96*  CO2 26 26 24   GLUCOSE 156* 134* 121*  BUN 36* 34* 31*  CREATININE 1.62* 1.54* 1.40*  CALCIUM 10.2 9.9 8.7*   GFR: Estimated Creatinine Clearance: 48.1 mL/min (A) (by C-G formula based on SCr of 1.4 mg/dL (H)). Liver Function Tests: Recent Labs  Lab  09/12/20 1205  AST 15  ALT 11  ALKPHOS 92  BILITOT 0.5  PROT 8.7*  ALBUMIN 5.0   Recent Labs  Lab 09/12/20 1205  LIPASE 46   No results for input(s): AMMONIA in the last 168 hours. Coagulation Profile: No results for input(s): INR, PROTIME in the last 168 hours. Cardiac Enzymes: No results for input(s): CKTOTAL, CKMB, CKMBINDEX, TROPONINI in the last 168 hours. BNP (last 3 results) No results for input(s): PROBNP in the last 8760 hours. HbA1C: No results for input(s): HGBA1C in the last 72 hours. CBG: Recent Labs  Lab 09/12/20 1942  GLUCAP 88   Lipid Profile: No results for input(s): CHOL, HDL, LDLCALC, TRIG, CHOLHDL, LDLDIRECT in the last 72 hours. Thyroid Function Tests: No results for input(s): TSH, T4TOTAL, FREET4, T3FREE, THYROIDAB in the last 72 hours. Anemia Panel: No results for input(s): VITAMINB12, FOLATE, FERRITIN, TIBC, IRON, RETICCTPCT in the last 72 hours. Urine analysis:    Component Value Date/Time   COLORURINE YELLOW 09/12/2020 1424   APPEARANCEUR CLEAR 09/12/2020 1424   LABSPEC 1.036 (H) 09/12/2020 1424   PHURINE 5.5 09/12/2020 1424   GLUCOSEU >1,000 (A) 09/12/2020 1424   HGBUR NEGATIVE 09/12/2020 1424   BILIRUBINUR NEGATIVE 09/12/2020 1424   KETONESUR NEGATIVE 09/12/2020 1424   PROTEINUR TRACE (A) 09/12/2020 1424   NITRITE NEGATIVE 09/12/2020 1424   LEUKOCYTESUR NEGATIVE 09/12/2020 1424   Sepsis Labs: @LABRCNTIP (procalcitonin:4,lacticidven:4) ) Recent Results (from the past 240 hour(s))  Resp Panel by RT-PCR (Flu A&B, Covid) Nasopharyngeal Swab     Status: None   Collection Time: 09/12/20  3:03 PM   Specimen: Nasopharyngeal Swab; Nasopharyngeal(NP) swabs in vial transport medium  Result Value Ref Range Status   SARS Coronavirus 2 by RT PCR NEGATIVE NEGATIVE Final    Comment: (NOTE) SARS-CoV-2 target nucleic acids are NOT DETECTED.  The SARS-CoV-2 RNA is generally detectable in upper respiratory specimens during the acute phase of  infection. The lowest concentration of SARS-CoV-2 viral copies this assay can detect is 138 copies/mL. A negative result does not preclude SARS-Cov-2 infection and should not be used as the sole basis for treatment or other patient management decisions. A negative result may occur with  improper specimen collection/handling, submission of  specimen other than nasopharyngeal swab, presence of viral mutation(s) within the areas targeted by this assay, and inadequate number of viral copies(<138 copies/mL). A negative result must be combined with clinical observations, patient history, and epidemiological information. The expected result is Negative.  Fact Sheet for Patients:  EntrepreneurPulse.com.au  Fact Sheet for Healthcare Providers:  IncredibleEmployment.be  This test is no t yet approved or cleared by the [place] FDA and  has been authorized for detection and/or diagnosis of SARS-CoV-2 by FDA under an Emergency Use Authorization (EUA). This EUA will remain  in effect (meaning this test can be used) for the duration of the COVID-19 declaration under Section 564(b)(1) of the Act, 21 U.S.C.section 360bbb-3(b)(1), unless the authorization is terminated  or revoked sooner.       Influenza A by PCR NEGATIVE NEGATIVE Final   Influenza B by PCR NEGATIVE NEGATIVE Final    Comment: (NOTE) The Xpert Xpress SARS-CoV-2/FLU/RSV plus assay is intended as an aid in the diagnosis of influenza from Nasopharyngeal swab specimens and should not be used as a sole basis for treatment. Nasal washings and aspirates are unacceptable for Xpert Xpress SARS-CoV-2/FLU/RSV testing.  Fact Sheet for Patients: EntrepreneurPulse.com.au  Fact Sheet for Healthcare Providers: IncredibleEmployment.be  This test is not yet approved or cleared by the [place] FDA and has been authorized for detection and/or diagnosis of SARS-CoV-2  by FDA under an Emergency Use Authorization (EUA). This EUA will remain in effect (meaning this test can be used) for the duration of the COVID-19 declaration under Section 564(b)(1) of the Act, 21 U.S.C. section 360bbb-3(b)(1), unless the authorization is terminated or revoked.  Performed at [facility], [address], [place], [place] [address]      Radiological Exams on Admission: CT Head Wo Contrast  Result Date: 09/12/2020 CLINICAL DATA:  Dizziness. EXAM: CT HEAD WITHOUT CONTRAST TECHNIQUE: Contiguous axial images were obtained from the base of the skull through the vertex without intravenous contrast. COMPARISON:  None. FINDINGS: Brain: There is mild cerebral atrophy with widening of the extra-axial spaces and ventricular dilatation. There are areas of decreased attenuation within the white matter tracts of the supratentorial brain, consistent with microvascular disease changes. Vascular: No hyperdense vessel or unexpected calcification. Skull: Normal. Negative for fracture or focal lesion. Sinuses/Orbits: No acute finding. Other: None. IMPRESSION: 1. Generalized cerebral atrophy. 2. No acute intracranial abnormality. Electronically Signed   By: Virgina Norfolk M.D.   On: 09/12/2020 14:57   CT Angio Chest/Abd/Pel for Dissection W and/or Wo Contrast  Result Date: 09/12/2020 CLINICAL DATA:  Dizziness, weakness, intermittent nausea and vomiting for the past month. EXAM: CT ANGIOGRAPHY CHEST, ABDOMEN AND PELVIS TECHNIQUE: Non-contrast CT of the chest was initially obtained. Multidetector CT imaging through the chest, abdomen and pelvis was performed using the standard protocol during bolus administration of intravenous contrast. Multiplanar reconstructed images and MIPs were obtained and reviewed to evaluate the vascular anatomy. CONTRAST:  80 mL OMNIPAQUE IOHEXOL 350 MG/ML SOLN COMPARISON:  CT abdomen and pelvis 08/15/2019. FINDINGS: CTA CHEST FINDINGS Cardiovascular:  Preferential opacification of the thoracic aorta. No evidence of thoracic aortic aneurysm or dissection. Normal heart size. No pericardial effusion. There is a small penetrating ulcer along the lateral aspect of the distal aortic arch. Calcific aortic and coronary atherosclerosis noted. Mediastinum/Nodes: No enlarged mediastinal, hilar, or axillary lymph nodes. Thyroid gland, trachea, and esophagus demonstrate no significant findings. Lungs/Pleura: Lungs are clear. No pleural effusion or pneumothorax. Musculoskeletal: No acute or focal bony abnormality. Review of the  MIP images confirms the above findings. CTA ABDOMEN AND PELVIS FINDINGS VASCULAR Aorta: Normal caliber aorta without aneurysm, dissection, vasculitis or significant stenosis. Celiac: Patent without aneurysm, dissection or evidence of vasculitis. The artery appears markedly stenotic at its takeoff from the aorta. SMA: Patent without evidence of aneurysm, dissection, vasculitis or significant stenosis. Renals: Both renal arteries are patent without evidence of aneurysm, dissection, vasculitis, fibromuscular dysplasia or significant stenosis. Accessory renal artery on the left to the lower pole is noted. IMA: Patent without evidence of aneurysm, dissection, vasculitis or significant stenosis. Inflow: Patent without evidence of aneurysm, dissection, vasculitis or significant stenosis. Veins: No obvious venous abnormality within the limitations of this arterial phase study. Review of the MIP images confirms the above findings. NON-VASCULAR Hepatobiliary: No focal liver abnormality is seen. Status post cholecystectomy. No biliary dilatation. There is mild fatty infiltration of the liver. Pancreas: Unremarkable. No pancreatic ductal dilatation or surrounding inflammatory changes. Spleen: Normal in size without focal abnormality. Adrenals/Urinary Tract: Adrenal glands are unremarkable. Kidneys are normal, without renal calculi, focal lesion, or hydronephrosis.  Bladder is unremarkable. Stomach/Bowel: Stomach is within normal limits. Appendix appears normal. No evidence of bowel wall thickening, distention, or inflammatory changes. Moderately large stool burden throughout the colon noted. Lymphatic: No lymphadenopathy. 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 Other: None. Musculoskeletal: No acute or focal abnormality. The patient is status post L3-S1 fusion. Review of the MIP images confirms the above findings. IMPRESSION: No acute abnormality chest, abdomen or pelvis. Negative for aortic dissection or aneurysm. The celiac axis opacifies but there appears to be high-grade stenosis at its takeoff from the aorta. Calcific aortic and coronary atherosclerosis. Small penetrating ulcer aortic arch. Fatty infiltration of the liver. Moderately large colonic stool burden throughout. Electronically Signed   By: Inge Rise M.D.   On: 09/12/2020 15:16    EKG: Independently reviewed. Sinus rhythm.   Assessment/Plan  1. Hyperkalemia; acute kidney injury superimposed on CKD IIIa  - Presents with nausea and vomiting and found to have potassium 6.1 and SCr of 1.62, up from 1.26 one month ago  - Treated with Lokelma, insulin/dextrose, IV bicarbonate, and IVF in ED  - Hold NSAIDs, continue IVF hydration, renally-dose medications follow serial chem panels   2. Penetrating ulcer of aorta  - Small penetrating ulcer of aortic arch noted incidentally on CTA in ED  - Cardiothoracic surgery consulted by ED physician, will follow-up on recommendations    4. Type II DM  - A1c was 9.5% in May 2022  - [place] and metformin for now and use sliding-scale Novolog     5. PAD  - No acute ischemia, continue statin and Plavix   6. Lumbar spinal stenosis   - Hold NSAIDs, continue gabapentin and Lyrica   7. Nausea, vomiting  - Patient presents with ~2 months of intermittent N/V without abdominal pain or diarrhea  - No acute intraabdominal finding on CT, exam  benign, lipase and LFTs normal  - Patient suspecting this could be secondary to constipation, will start bowel regimen, continue supportive care   8. Dizziness, presyncope  - Asymptomatic on admission  - Following with cardiology, has ambulatory heart monitor and planned for LHC on 09/20/20    DVT prophylaxis: Lovenox   Code Status: Full   Level of Care: Level of care: Med-Surg Family Communication: none present   Disposition Plan:  Patient is from: Home  Anticipated d/c is to: Home Anticipated d/c date is: Possibly as early as 09/13/20 Patient currently: Pending  improvement in renal function and potassium   Consults called: Cardiothoracic surgery consulted by ED physician   Admission status: Inpatient     Vianne Bulls, MD [facility]  09/12/2020, 9:44 PM

## 2020-09-12 NOTE — ED Notes (Signed)
Been having weakness off / on over last month.  Worse today with on set of vomiting.  Denies pain.  C/O dizziness and overall tiredness and weakness

## 2020-09-13 ENCOUNTER — Ambulatory Visit: Payer: BC Managed Care – PPO | Admitting: Cardiology

## 2020-09-13 ENCOUNTER — Other Ambulatory Visit: Payer: Self-pay | Admitting: *Deleted

## 2020-09-13 DIAGNOSIS — N1831 Chronic kidney disease, stage 3a: Secondary | ICD-10-CM

## 2020-09-13 DIAGNOSIS — E875 Hyperkalemia: Secondary | ICD-10-CM | POA: Diagnosis not present

## 2020-09-13 DIAGNOSIS — N179 Acute kidney failure, unspecified: Secondary | ICD-10-CM | POA: Diagnosis not present

## 2020-09-13 DIAGNOSIS — I719 Aortic aneurysm of unspecified site, without rupture: Secondary | ICD-10-CM

## 2020-09-13 LAB — BASIC METABOLIC PANEL
Anion gap: 9 (ref 5–15)
BUN: 28 mg/dL — ABNORMAL HIGH (ref 8–23)
CO2: 25 mmol/L (ref 22–32)
Calcium: 8.9 mg/dL (ref 8.9–10.3)
Chloride: 102 mmol/L (ref 98–111)
Creatinine, Ser: 1.32 mg/dL — ABNORMAL HIGH (ref 0.61–1.24)
GFR, Estimated: 54 mL/min — ABNORMAL LOW (ref >60)
Glucose, Bld: 105 mg/dL — ABNORMAL HIGH (ref 70–99)
Potassium: 4.8 mmol/L (ref 3.5–5.1)
Sodium: 136 mmol/L (ref 135–145)

## 2020-09-13 LAB — CBC
HCT: 37.3 % — ABNORMAL LOW (ref 39.0–52.0)
Hemoglobin: 12.2 g/dL — ABNORMAL LOW (ref 13.0–17.0)
MCH: 31.4 pg (ref 26.0–34.0)
MCHC: 32.7 g/dL (ref 30.0–36.0)
MCV: 95.9 fL (ref 80.0–100.0)
Platelets: 254 10*3/uL (ref 150–400)
RBC: 3.89 MIL/uL — ABNORMAL LOW (ref 4.22–5.81)
RDW: 13.9 % (ref 11.5–15.5)
WBC: 8.3 10*3/uL (ref 4.0–10.5)
nRBC: 0 % (ref 0.0–0.2)

## 2020-09-13 LAB — GLUCOSE, CAPILLARY
Glucose-Capillary: 96 mg/dL (ref 70–99)
Glucose-Capillary: 174 mg/dL — ABNORMAL HIGH (ref 70–99)

## 2020-09-13 MED ORDER — METFORMIN HCL 500 MG PO TABS: 1000.0000 mg | ORAL_TABLET | Freq: Two times a day (BID) | ORAL | 1 refills | Status: DC

## 2020-09-13 MED ORDER — BISACODYL 5 MG PO TBEC
10.0000 mg | DELAYED_RELEASE_TABLET | Freq: Once | ORAL | Status: AC
  Administered 2020-09-13: 10 mg via ORAL
  Filled 2020-09-13: qty 2

## 2020-09-13 MED ORDER — POLYETHYLENE GLYCOL 3350 17 G PO PACK
17.0000 g | PACK | Freq: Two times a day (BID) | ORAL | Status: DC
  Administered 2020-09-13: 17 g via ORAL
  Filled 2020-09-13: qty 1

## 2020-09-13 MED ORDER — POLYETHYLENE GLYCOL 3350 17 G PO PACK: 17.0000 g | PACK | Freq: Two times a day (BID) | ORAL | 0 refills | Status: AC

## 2020-09-13 MED ORDER — SENNA 8.6 MG PO TABS: 1.0000 | ORAL_TABLET | Freq: Two times a day (BID) | ORAL | 0 refills | Status: AC

## 2020-09-13 NOTE — Care Management CC44 (Signed)
Condition Code 44 Documentation Completed  Patient Details  Name: Harry Zamora MRN: [id number] Date of Birth: 1940-05-27   Condition Code 44 given:  Yes Patient signature on Condition Code 44 notice:  Yes Documentation of 2 MD's agreement:  Yes Code 44 added to claim:  Yes    Joanne Chars, [place] 09/13/2020, 1:13 PM

## 2020-09-13 NOTE — Discharge Summary (Signed)
Physician Discharge Summary  Harry Zamora RNH:[id number] DOB: 1940/12/06 DOA: 09/12/2020  PCP: [name], MD  Admit date: 09/12/2020 Discharge date: 09/13/2020  Admitted From: Home Disposition:  Home   Recommendations for Outpatient Follow-up:  Follow up with PCP in 1-2 weeks Please obtain BMP/CBC in one week Patient to follow-up with CT surgery as an outpatient regarding incidental finding of penetrating ulcer of aorta.  Home Health:NO Equipment/Devices:None  Discharge Condition:Stable CODE STATUS:FULL Diet recommendation: Heart Healthy / Carb Modified   Brief/Interim Summary:  Harry Zamora is a 80 y.o. male with medical history significant for type 2 diabetes mellitus, peripheral arterial disease, lumbar spinal stenosis, chronic kidney disease stage IIIa, and approximately 2 months of intermittent nausea and nonbloody vomiting that prompted his presentation to the ED .  Patient reports recurrent episodes of nausea and vomiting over the past few weeks, came in for evaluation.  He has not had any pain or fevers associated with this.  He denies any diarrhea and reports that he has been constipated.  He denies any chest pain but has been experiencing some occasional dyspnea, dizziness, and presyncope, and is following with cardiology for this, reports that he has been evaluated with ambulatory heart monitor, and is planned for cardiac catheterization on 09/20/2020.  Patient wonders if his nausea could be related to constipation. Upon arrival to the ED, patient is found to be afebrile, saturating well on room air, and with stable blood pressure.  EKG features sinus rhythm.  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  Chemistry panel features a creatinine 1.62 and potassium 6.1.  CBC with mild leukocytosis.  CT surgery was  consulted by the ED physician and the patient was treated with saline bolus, Lokelma, IV bicarbonate, and insulin with dextrose.  He was transferred to [facility] for ongoing evaluation and management.    Hyperkalemia; acute kidney injury superimposed on CKD IIIa  - Presents with nausea and vomiting and found to have potassium 6.1 and SCr of 1.62, up from 1.26 one month ago  - Treated with Lokelma, insulin/dextrose, IV bicarbonate, and IVF in ED  -Renal function back to baseline this morning, sodium within normal limit at 4.8 today, will insist on discharge, he received IV fluids, currently no further nausea or vomiting, appetite has improved, he was encouraged to increase his fluid intake.  Incidental finding on imaging of penetrating ulcer of aorta  - Small penetrating ulcer of aortic arch noted incidentally on CTA in ED  -ED physician discussed with cardiothoracic surgery, per ED note on 6/30   "CT does show aortic ulcer, discussed with PA-C Ryan of CT surgery who spoke with Dr. 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    Type II DM  - A1c was 9.5% in May 2022  -Continue Farxiga on discharge, patient instructed to hold metformin for 2 days given he received IV contrast    PAD  - No acute ischemia, continue statin and Plavix    Lumbar spinal  stenosis   - Hold NSAIDs, continue gabapentin and Lyrica   Nausea, vomiting, abdominal pain, related to constipation. - Patient presents with ~2 months of intermittent N/V without abdominal pain or diarrhea - No acute intraabdominal finding on CT,  exam benign, lipase and LFTs normal -Denies any complaints today, this is most likely due to significant constipation given imaging was significant for large stool burden, he is instructed to continue with laxatives upon discharge   Dizziness, presyncope - Asymptomatic on admission - Following with cardiology, has ambulatory heart monitor and planned for LHC on 09/20/20       Discharge Diagnoses:  Principal Problem:   Hyperkalemia Active Problems:   Spinal stenosis of lumbar region with neurogenic claudication   PAD (peripheral artery disease) (HCC)   Acute renal failure superimposed on stage 3a chronic kidney disease ([place])   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    Activity: As tolerated with Full fall precautions use walker/cane & assistance as needed   Disposition Home    Diet: Heart Healthy    On your next visit with your primary care physician please Get Medicines reviewed and adjusted.   Please request your Prim.MD to go over all [facility] Tests and Procedure/Radiological results at the follow up, please get all [facility] records sent to your Prim MD by signing [facility] release before you go home.   If you experience worsening of your admission symptoms, develop shortness of breath, life threatening emergency, suicidal or homicidal thoughts you must seek medical attention immediately by calling 911 or calling your MD immediately  if symptoms less severe.  You Must read complete instructions/literature along with all the possible adverse reactions/side effects for all the Medicines you take and that have been prescribed to you. Take any new Medicines after you have  completely understood and accpet all the possible adverse reactions/side effects.   Do not drive, operating heavy machinery, perform activities at heights, swimming or participation in water activities or provide baby sitting services if your were admitted for syncope or siezures until you have seen by Primary MD or a Neurologist and advised to do so again.  Do not drive when taking Pain medications.    Do not take more than prescribed Pain, Sleep and Anxiety Medications  Special Instructions: If you have smoked or chewed Tobacco  in the last 2 yrs please stop smoking, stop any regular Alcohol  and or any Recreational drug use.  Wear Seat belts while driving.   Please note  You were cared for by a hospitalist during your [facility] stay. If you have any questions about your discharge medications or the care you received while you were in the [facility] after you are discharged, you can call the unit and asked to speak with the hospitalist on call if the hospitalist that took care of you is not available. Once you are discharged, your primary care physician will handle any further medical issues. Please note that NO REFILLS for any discharge medications will be authorized once you are discharged, as it is imperative that you return to your primary care physician (or establish a  relationship with a primary care physician if you do not have one) for your aftercare needs so that they can reassess your need for medications and monitor your lab values.   Increase activity slowly   Complete by: As directed       Allergies as of 09/13/2020       Reactions   Levaquin [levofloxacin In D5w] Rash   Niaspan [niacin Er] Rash, Other (See Comments)   Flushing reaction   Tramadol Rash        Medication List     STOP taking these medications    meloxicam 15 MG tablet Commonly known as: MOBIC       TAKE these medications    clopidogrel 75 MG tablet Commonly known as: PLAVIX Take 1 tablet (75  mg total) by mouth daily.   Farxiga 10 MG Tabs tablet Generic drug: dapagliflozin propanediol Take 10 mg by mouth daily.   gabapentin 600 MG tablet Commonly known as: NEURONTIN Take 600 mg by mouth 2 (two) times daily.   lovastatin 40 MG tablet Commonly known as: MEVACOR Take 40 mg by mouth at bedtime.   metFORMIN 500 MG tablet Commonly known as: GLUCOPHAGE Take 2 tablets (1,000 mg total) by mouth 2 (two) times daily with a meal. Start taking on: September 16, 2020 What changed: These instructions start on September 16, 2020. If you are unsure what to do until then, ask your doctor or other care provider.   metoprolol succinate 25 MG 24 hr tablet Commonly known as: TOPROL-XL TAKE 1 TABLET(25 MG) BY MOUTH DAILY What changed: See the new instructions.   nitroGLYCERIN 0.4 MG SL tablet Commonly known as: NITROSTAT Place 1 tablet (0.4 mg total) under the tongue every 5 (five) minutes as needed for chest pain.   polyethylene glycol 17 g packet Commonly known as: MIRALAX / GLYCOLAX Take 17 g by mouth 2 (two) times daily for 3 days.   pregabalin 75 MG capsule Commonly known as: LYRICA Take 75 mg by mouth 3 (three) times daily.   rOPINIRole 4 MG tablet Commonly known as: REQUIP Take 4 mg by mouth at bedtime.   senna 8.6 MG Tabs tablet Commonly known as: SENOKOT Take 1 tablet (8.6 mg total) by mouth 2 (two) times daily for 10 days.   Trulicity 1.5 TD/3.2KG Sopn Generic drug: Dulaglutide Inject 1.5 mg into the skin every Wednesday.   vitamin B-12 500 MCG tablet Commonly known as: CYANOCOBALAMIN Take 500 mcg by mouth daily.        Follow-up Information     [name], MD Follow up in 2 week(s).   Specialty: Endocrinology Contact information: [place] [address] [phone]         Gaye Pollack, MD Follow up in 1 month(s).   Specialty: Cardiothoracic Surgery Contact information: [address] [place]  [address] [phone]                Allergies  Allergen Reactions   Levaquin [Levofloxacin In D5w] Rash   Niaspan [Niacin Er] Rash and Other (See Comments)    Flushing reaction   Tramadol Rash    Consultations: He discussed with CT surgery by phone, I have discussed with vascular surgery by phone   Procedures/Studies: CT Head Wo Contrast  Result Date: 09/12/2020 CLINICAL DATA:  Dizziness. EXAM: CT HEAD WITHOUT CONTRAST TECHNIQUE: Contiguous axial images were obtained from the base of the skull through the vertex without intravenous contrast. COMPARISON:  None. FINDINGS:  Brain: There is mild cerebral atrophy with widening of the extra-axial spaces and ventricular dilatation. There are areas of decreased attenuation within the white matter tracts of the supratentorial brain, consistent with microvascular disease changes. Vascular: No hyperdense vessel or unexpected calcification. Skull: Normal. Negative for fracture or focal lesion. Sinuses/Orbits: No acute finding. Other: None. IMPRESSION: 1. Generalized cerebral atrophy. 2. No acute intracranial abnormality. Electronically Signed   By: Virgina Norfolk M.D.   On: 09/12/2020 14:57   CT Angio Chest/Abd/Pel for Dissection W and/or Wo Contrast  Result Date: 09/12/2020 CLINICAL DATA:  Dizziness, weakness, intermittent nausea and vomiting for the past month. EXAM: CT ANGIOGRAPHY CHEST, ABDOMEN AND PELVIS TECHNIQUE: Non-contrast CT of the chest was initially obtained. Multidetector CT imaging through the chest, abdomen and pelvis was performed using the standard protocol during bolus administration of intravenous contrast. Multiplanar reconstructed images and MIPs were obtained and reviewed to evaluate the vascular anatomy. CONTRAST:  80 mL OMNIPAQUE IOHEXOL 350 MG/ML SOLN COMPARISON:  CT abdomen and pelvis 08/15/2019. FINDINGS: CTA CHEST FINDINGS Cardiovascular: Preferential opacification of the thoracic aorta. No evidence of thoracic aortic  aneurysm or dissection. Normal heart size. No pericardial effusion. There is a small penetrating ulcer along the lateral aspect of the distal aortic arch. Calcific aortic and coronary atherosclerosis noted. Mediastinum/Nodes: No enlarged mediastinal, hilar, or axillary lymph nodes. Thyroid gland, trachea, and esophagus demonstrate no significant findings. Lungs/Pleura: Lungs are clear. No pleural effusion or pneumothorax. Musculoskeletal: No acute or focal bony abnormality. Review of the MIP images confirms the above findings. CTA ABDOMEN AND PELVIS FINDINGS VASCULAR Aorta: Normal caliber aorta without aneurysm, dissection, vasculitis or significant stenosis. Celiac: Patent without aneurysm, dissection or evidence of vasculitis. The artery appears markedly stenotic at its takeoff from the aorta. SMA: Patent without evidence of aneurysm, dissection, vasculitis or significant stenosis. Renals: Both renal arteries are patent without evidence of aneurysm, dissection, vasculitis, fibromuscular dysplasia or significant stenosis. Accessory renal artery on the left to the lower pole is noted. IMA: Patent without evidence of aneurysm, dissection, vasculitis or significant stenosis. Inflow: Patent without evidence of aneurysm, dissection, vasculitis or significant stenosis. Veins: No obvious venous abnormality within the limitations of this arterial phase study. Review of the MIP images confirms the above findings. NON-VASCULAR Hepatobiliary: No focal liver abnormality is seen. Status post cholecystectomy. No biliary dilatation. There is mild fatty infiltration of the liver. Pancreas: Unremarkable. No pancreatic ductal dilatation or surrounding inflammatory changes. Spleen: Normal in size without focal abnormality. Adrenals/Urinary Tract: Adrenal glands are unremarkable. Kidneys are normal, without renal calculi, focal lesion, or hydronephrosis. Bladder is unremarkable. Stomach/Bowel: Stomach is within normal limits. Appendix  appears normal. No evidence of bowel wall thickening, distention, or inflammatory changes. Moderately large stool burden throughout the colon noted. Lymphatic: No lymphadenopathy. 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 Other: None. Musculoskeletal: No acute or focal abnormality. The patient is status post L3-S1 fusion. Review of the MIP images confirms the above findings. IMPRESSION: No acute abnormality chest, abdomen or pelvis. Negative for aortic dissection or aneurysm. The celiac axis opacifies but there appears to be high-grade stenosis at its takeoff from the aorta. Calcific aortic and coronary atherosclerosis. Small penetrating ulcer aortic arch. Fatty infiltration of the liver. Moderately large colonic stool burden throughout. Electronically Signed   By: Inge Rise M.D.   On: 09/12/2020 15:16   PCV ECHOCARDIOGRAM COMPLETE  Result Date: 09/01/2020 Echocardiogram 08/28/2020:  Mildly depressed LV systolic function with EF 50%. Left ventricle cavity is normal in size. Mild  concentric hypertrophy of the left ventricle. Normal global wall motion. Doppler evidence of grade I (impaired) diastolic dysfunction, elevated LAP. Calculated EF 50%. Left atrial cavity is mildly dilated by volume index. 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  PCV LOWER ARTERIAL (BILATERAL)  Result Date: 09/01/2020 Lower Extremity Arterial Duplex 08/28/2020: No hemodynamically significant stenoses are identified in the right or the left lower extremity arterial system. There is monophasic waveform in the bilateral AT and PT vessels and left PT has spectral broadening suggestive of severe diffuse disease. This exam reveals moderately decreased perfusion of the right lower extremity, noted at the anterior tibial artery level (ABI 0.73) and moderately decreased perfusion of the left lower extremity, noted at the post tibial artery level (ABI 0.73).   PCV AORTA DUPLEX  Result Date: 09/01/2020 Abdominal  Aortic Duplex 08/28/2020: Mild diffuse plaque noted in the proximal, mid and distal abdominal aorta. No AAA. Normal internal iliac artery velocity without hemodynamic stenosis  Left iliac artery velocity waveform is mildly damped.   PCV MYOCARDIAL PERFUSION WO LEXISCAN  Result Date: 08/31/2020 Exercise Tetrofosmin stress test 08/28/2020: Exercise nuclear stress test was performed using Bruce protocol. Patient reached 4.6 METS, and 93% of age predicted maximum heart rate. Exercise capacity was low. No chest pain reported. Heart rate and hemodynamic response were normal. Stress EKG showed sinus tachycardia, old anteroseptal infarct, no significant ST-T changes. SPECT images show small sized, severe intensity, reversible perfusion defect in apical anterior myocardium, with associated mild hypokinesis. Stress LVEF 47%. High risk study.      Subjective:  No nausea, no vomiting, he denies any further abdominal pain. Discharge Exam: Vitals:   09/13/20 0626 09/13/20 1056  BP: 134/84 125/78  Pulse: 72 65  Resp: 18 20  Temp: 98 F (36.7 C) (!) 97 F (36.1 C)  SpO2: 98% 98%   Vitals:   09/12/20 1531 09/12/20 1945 09/13/20 0626 09/13/20 1056  BP: 116/71 136/69 134/84 125/78  Pulse: 78 74 72 65  Resp: 18 18 18 20   Temp:  98 F (36.7 C) 98 F (36.7 C) (!) 97 F (36.1 C)  TempSrc:  Oral Oral Axillary  SpO2: 96% 97% 98% 98%  Weight:      Height:        General: Pt is alert, awake, not in acute distress Cardiovascular: RRR, S1/S2 +, no rubs, no gallops Respiratory: CTA bilaterally, no wheezing, no rhonchi Abdominal: Soft, NT, ND, bowel sounds + Extremities: no edema, no cyanosis    The results of significant diagnostics from this hospitalization (including imaging, microbiology, ancillary and laboratory) are listed below for reference.     Microbiology: Recent Results (from the past 240 hour(s))  Resp Panel by RT-PCR (Flu A&B, Covid) Nasopharyngeal Swab     Status: None   Collection  Time: 09/12/20  3:03 PM   Specimen: Nasopharyngeal Swab; Nasopharyngeal(NP) swabs in vial transport medium  Result Value Ref Range Status   SARS Coronavirus 2 by RT PCR NEGATIVE NEGATIVE Final    Comment: (NOTE) SARS-CoV-2 target nucleic acids are NOT DETECTED.  The SARS-CoV-2 RNA is generally detectable in upper respiratory specimens during the acute phase of infection. The lowest concentration of SARS-CoV-2 viral copies this assay can detect is 138 copies/mL. A negative result does not preclude SARS-Cov-2 infection and should not be used as the sole basis for treatment or other patient management decisions. A negative result may occur with  improper specimen collection/handling, submission of specimen other than nasopharyngeal swab, presence of  viral mutation(s) within the areas targeted by this assay, and inadequate number of viral copies(<138 copies/mL). A negative result must be combined with clinical observations, patient history, and epidemiological information. The expected result is Negative.  Fact Sheet for Patients:  EntrepreneurPulse.com.au  Fact Sheet for Healthcare Providers:  IncredibleEmployment.be  This test is no t yet approved or cleared by the [place] FDA and  has been authorized for detection and/or diagnosis of SARS-CoV-2 by FDA under an Emergency Use Authorization (EUA). This EUA will remain  in effect (meaning this test can be used) for the duration of the COVID-19 declaration under Section 564(b)(1) of the Act, 21 U.S.C.section 360bbb-3(b)(1), unless the authorization is terminated  or revoked sooner.       Influenza A by PCR NEGATIVE NEGATIVE Final   Influenza B by PCR NEGATIVE NEGATIVE Final    Comment: (NOTE) The Xpert Xpress SARS-CoV-2/FLU/RSV plus assay is intended as an aid in the diagnosis of influenza from Nasopharyngeal swab specimens and should not be used as a sole basis for treatment. Nasal washings  and aspirates are unacceptable for Xpert Xpress SARS-CoV-2/FLU/RSV testing.  Fact Sheet for Patients: EntrepreneurPulse.com.au  Fact Sheet for Healthcare Providers: IncredibleEmployment.be  This test is not yet approved or cleared by the [place] FDA and has been authorized for detection and/or diagnosis of SARS-CoV-2 by FDA under an Emergency Use Authorization (EUA). This EUA will remain in effect (meaning this test can be used) for the duration of the COVID-19 declaration under Section 564(b)(1) of the Act, 21 U.S.C. section 360bbb-3(b)(1), unless the authorization is terminated or revoked.  Performed at [facility], [address] [place], [place] [address]      Labs: BNP (last 3 results) No results for input(s): BNP in the last 8760 hours. Basic Metabolic Panel: Recent Labs  Lab 09/12/20 1205 09/12/20 1327 09/12/20 2020 09/13/20 0157  NA 139 136 131* 136  K 6.1* 6.5* 5.1 4.8  CL 100 100 96* 102  CO2 26 26 24 25   GLUCOSE 156* 134* 121* 105*  BUN 36* 34* 31* 28*  CREATININE 1.62* 1.54* 1.40* 1.32*  CALCIUM 10.2 9.9 8.7* 8.9   Liver Function Tests: Recent Labs  Lab 09/12/20 1205  AST 15  ALT 11  ALKPHOS 92  BILITOT 0.5  PROT 8.7*  ALBUMIN 5.0   Recent Labs  Lab 09/12/20 1205  LIPASE 46   No results for input(s): AMMONIA in the last 168 hours. CBC: Recent Labs  Lab 09/12/20 1205 09/13/20 0157  WBC 11.2* 8.3  NEUTROABS 8.3*  --   HGB 14.0 12.2*  HCT 44.3 37.3*  MCV 97.6 95.9  PLT 300 254   Cardiac Enzymes: No results for input(s): CKTOTAL, CKMB, CKMBINDEX, TROPONINI in the last 168 hours. BNP: Invalid input(s): POCBNP CBG: Recent Labs  Lab 09/12/20 1942 09/13/20 0737 09/13/20 1143  GLUCAP 88 96 174*   D-Dimer No results for input(s): DDIMER in the last 72 hours. Hgb A1c No results for input(s): HGBA1C in the last 72 hours. Lipid Profile No results for input(s): CHOL,  HDL, LDLCALC, TRIG, CHOLHDL, LDLDIRECT in the last 72 hours. Thyroid function studies No results for input(s): TSH, T4TOTAL, T3FREE, THYROIDAB in the last 72 hours.  Invalid input(s): FREET3 Anemia work up No results for input(s): VITAMINB12, FOLATE, FERRITIN, TIBC, IRON, RETICCTPCT in the last 72 hours. Urinalysis    Component Value Date/Time   COLORURINE YELLOW 09/12/2020 1424   APPEARANCEUR CLEAR 09/12/2020 1424   LABSPEC 1.036 (H) 09/12/2020 1424  PHURINE 5.5 09/12/2020 1424   GLUCOSEU >1,000 (A) 09/12/2020 1424   HGBUR NEGATIVE 09/12/2020 1424   BILIRUBINUR NEGATIVE 09/12/2020 1424   [place] 09/12/2020 1424   PROTEINUR TRACE (A) 09/12/2020 1424   NITRITE NEGATIVE 09/12/2020 1424   LEUKOCYTESUR NEGATIVE 09/12/2020 1424   Sepsis Labs Invalid input(s): PROCALCITONIN,  WBC,  LACTICIDVEN Microbiology Recent Results (from the past 240 hour(s))  Resp Panel by RT-PCR (Flu A&B, Covid) Nasopharyngeal Swab     Status: None   Collection Time: 09/12/20  3:03 PM   Specimen: Nasopharyngeal Swab; Nasopharyngeal(NP) swabs in vial transport medium  Result Value Ref Range Status   SARS Coronavirus 2 by RT PCR NEGATIVE NEGATIVE Final    Comment: (NOTE) SARS-CoV-2 target nucleic acids are NOT DETECTED.  The SARS-CoV-2 RNA is generally detectable in upper respiratory specimens during the acute phase of infection. The lowest concentration of SARS-CoV-2 viral copies this assay can detect is 138 copies/mL. A negative result does not preclude SARS-Cov-2 infection and should not be used as the sole basis for treatment or other patient management decisions. A negative result may occur with  improper specimen collection/handling, submission of specimen other than nasopharyngeal swab, presence of viral mutation(s) within the areas targeted by this assay, and inadequate number of viral copies(<138 copies/mL). A negative result must be combined with clinical observations, patient  history, and epidemiological information. The expected result is Negative.  Fact Sheet for Patients:  EntrepreneurPulse.com.au  Fact Sheet for Healthcare Providers:  IncredibleEmployment.be  This test is no t yet approved or cleared by the [place] FDA and  has been authorized for detection and/or diagnosis of SARS-CoV-2 by FDA under an Emergency Use Authorization (EUA). This EUA will remain  in effect (meaning this test can be used) for the duration of the COVID-19 declaration under Section 564(b)(1) of the Act, 21 U.S.C.section 360bbb-3(b)(1), unless the authorization is terminated  or revoked sooner.       Influenza A by PCR NEGATIVE NEGATIVE Final   Influenza B by PCR NEGATIVE NEGATIVE Final    Comment: (NOTE) The Xpert Xpress SARS-CoV-2/FLU/RSV plus assay is intended as an aid in the diagnosis of influenza from Nasopharyngeal swab specimens and should not be used as a sole basis for treatment. Nasal washings and aspirates are unacceptable for Xpert Xpress SARS-CoV-2/FLU/RSV testing.  Fact Sheet for Patients: EntrepreneurPulse.com.au  Fact Sheet for Healthcare Providers: IncredibleEmployment.be  This test is not yet approved or cleared by the [place] FDA and has been authorized for detection and/or diagnosis of SARS-CoV-2 by FDA under an Emergency Use Authorization (EUA). This EUA will remain in effect (meaning this test can be used) for the duration of the COVID-19 declaration under Section 564(b)(1) of the Act, 21 U.S.C. section 360bbb-3(b)(1), unless the authorization is terminated or revoked.  Performed at [facility], [address], [place], [place] [address]      Time coordinating discharge: Over 30 minutes  SIGNED:   Phillips Climes, MD  [facility] 09/13/2020, 12:33 PM Pager   If 7PM-7AM, please contact  night-coverage [email or web address] Password [email or web address]

## 2020-09-13 NOTE — Progress Notes (Signed)
Discharge instructions given to and reviewed with patient.  Patient verbalized understanding of all discharge instructions including changes to medications and follow up appointments. Stressed to patient importance of not taking metformin until Monday and he verbalized understanding with teach back. Patient A&Ox4 with no distress noted. Patient left unit by wheelchair with Terri, NT. Patient discharged home with wife driving.

## 2020-09-13 NOTE — Discharge Instructions (Signed)
Follow with Primary MD [name], MD in 7 days   Get CBC, CMP,  checked  by Primary MD next visit.    Activity: As tolerated with Full fall precautions use walker/cane & assistance as needed   Disposition Home    Diet: Heart Healthy    On your next visit with your primary care physician please Get Medicines reviewed and adjusted.   Please request your Prim.MD to go over all Hospital Tests and Procedure/Radiological results at the follow up, please get all Hospital records sent to your Prim MD by signing hospital release before you go home.   If you experience worsening of your admission symptoms, develop shortness of breath, life threatening emergency, suicidal or homicidal thoughts you must seek medical attention immediately by calling 911 or calling your MD immediately  if symptoms less severe.  You Must read complete instructions/literature along with all the possible adverse reactions/side effects for all the Medicines you take and that have been prescribed to you. Take any new Medicines after you have completely understood and accpet all the possible adverse reactions/side effects.   Do not drive, operating heavy machinery, perform activities at heights, swimming or participation in water activities or provide baby sitting services if your were admitted for syncope or siezures until you have seen by Primary MD or a Neurologist and advised to do so again.  Do not drive when taking Pain medications.    Do not take more than prescribed Pain, Sleep and Anxiety Medications  Special Instructions: If you have smoked or chewed Tobacco  in the last 2 yrs please stop smoking, stop any regular Alcohol  and or any Recreational drug use.  Wear Seat belts while driving.   Please note  You were cared for by a hospitalist during your hospital stay. If you have any questions about your discharge medications or the care you received while you were in the hospital after you are  discharged, you can call the unit and asked to speak with the hospitalist on call if the hospitalist that took care of you is not available. Once you are discharged, your primary care physician will handle any further medical issues. Please note that NO REFILLS for any discharge medications will be authorized once you are discharged, as it is imperative that you return to your primary care physician (or establish a relationship with a primary care physician if you do not have one) for your aftercare needs so that they can reassess your need for medications and monitor your lab values.

## 2020-09-13 NOTE — Care Management Obs Status (Signed)
[place] NOTIFICATION   Patient Details  Name: RAMEL TOBON MRN: [id number] Date of Birth: 08-19-1940   Medicare Observation Status Notification Given:  Yes    Joanne Chars, [place] 09/13/2020, 1:12 PM

## 2020-09-13 NOTE — Progress Notes (Signed)
Made Dr. [facility]ron [facility] aware that patient refused metoprolol stating he does not take this medication. MD acknowledged and gave no new orders.

## 2020-09-16 DIAGNOSIS — R9439 Abnormal result of other cardiovascular function study: Secondary | ICD-10-CM

## 2020-09-17 ENCOUNTER — Ambulatory Visit (HOSPITAL_COMMUNITY)
Admission: RE | Admit: 2020-09-17 | Discharge: 2020-09-17 | Disposition: A | Discharge status: Home / Self Care | Payer: Medicare Other | Attending: Cardiology | Admitting: Cardiology

## 2020-09-17 ENCOUNTER — Encounter (HOSPITAL_COMMUNITY)
Admission: RE | Disposition: A | Discharge status: Home / Self Care | Payer: Self-pay | Source: Home / Self Care | Attending: Cardiology

## 2020-09-17 ENCOUNTER — Other Ambulatory Visit: Payer: Self-pay

## 2020-09-17 DIAGNOSIS — E1151 Type 2 diabetes mellitus with diabetic peripheral angiopathy without gangrene: Secondary | ICD-10-CM | POA: Diagnosis not present

## 2020-09-17 DIAGNOSIS — I2510 Atherosclerotic heart disease of native coronary artery without angina pectoris: Secondary | ICD-10-CM | POA: Insufficient documentation

## 2020-09-17 DIAGNOSIS — R55 Syncope and collapse: Secondary | ICD-10-CM | POA: Insufficient documentation

## 2020-09-17 DIAGNOSIS — F17290 Nicotine dependence, other tobacco product, uncomplicated: Secondary | ICD-10-CM | POA: Diagnosis not present

## 2020-09-17 DIAGNOSIS — Z794 Long term (current) use of insulin: Secondary | ICD-10-CM | POA: Insufficient documentation

## 2020-09-17 DIAGNOSIS — Z7984 Long term (current) use of oral hypoglycemic drugs: Secondary | ICD-10-CM | POA: Insufficient documentation

## 2020-09-17 DIAGNOSIS — Z79899 Other long term (current) drug therapy: Secondary | ICD-10-CM | POA: Diagnosis not present

## 2020-09-17 DIAGNOSIS — R9439 Abnormal result of other cardiovascular function study: Secondary | ICD-10-CM | POA: Diagnosis present

## 2020-09-17 HISTORY — PX: LEFT HEART CATH AND CORONARY ANGIOGRAPHY: CATH118249

## 2020-09-17 LAB — GLUCOSE, CAPILLARY
Glucose-Capillary: 221 mg/dL — ABNORMAL HIGH (ref 70–99)
Glucose-Capillary: 71 mg/dL (ref 70–99)

## 2020-09-17 SURGERY — LEFT HEART CATH AND CORONARY ANGIOGRAPHY
Anesthesia: LOCAL

## 2020-09-17 MED ORDER — SODIUM CHLORIDE 0.9 % IV SOLN: 250.0000 mL | INTRAVENOUS | Status: DC | PRN

## 2020-09-17 MED ORDER — SODIUM CHLORIDE 0.9% FLUSH: 3.0000 mL | INTRAVENOUS | Status: DC | PRN

## 2020-09-17 MED ORDER — SODIUM CHLORIDE 0.9% FLUSH: 3.0000 mL | Freq: Two times a day (BID) | INTRAVENOUS | Status: DC

## 2020-09-17 MED ORDER — HEPARIN (PORCINE) IN NACL 1000-0.9 UT/500ML-% IV SOLN
INTRAVENOUS | Status: AC
  Filled 2020-09-17: qty 1000

## 2020-09-17 MED ORDER — IOHEXOL 350 MG/ML SOLN
INTRAVENOUS | Status: DC | PRN
  Administered 2020-09-17: 40 mL

## 2020-09-17 MED ORDER — FENTANYL CITRATE (PF) 100 MCG/2ML IJ SOLN
INTRAMUSCULAR | Status: AC
  Filled 2020-09-17: qty 2

## 2020-09-17 MED ORDER — SODIUM CHLORIDE 0.9 % WEIGHT BASED INFUSION
3.0000 mL/kg/h | INTRAVENOUS | Status: AC
  Administered 2020-09-17: 3 mL/kg/h via INTRAVENOUS

## 2020-09-17 MED ORDER — ACETAMINOPHEN 325 MG PO TABS: 650.0000 mg | ORAL_TABLET | ORAL | Status: DC | PRN

## 2020-09-17 MED ORDER — ONDANSETRON HCL 4 MG/2ML IJ SOLN: 4.0000 mg | Freq: Four times a day (QID) | INTRAMUSCULAR | Status: DC | PRN

## 2020-09-17 MED ORDER — LABETALOL HCL 5 MG/ML IV SOLN: 10.0000 mg | INTRAVENOUS | Status: DC | PRN

## 2020-09-17 MED ORDER — LIDOCAINE HCL (PF) 1 % IJ SOLN
INTRAMUSCULAR | Status: DC | PRN
  Administered 2020-09-17: 2 mL

## 2020-09-17 MED ORDER — ASPIRIN 81 MG PO CHEW
81.0000 mg | CHEWABLE_TABLET | ORAL | Status: AC
  Administered 2020-09-17: 81 mg via ORAL
  Filled 2020-09-17: qty 1

## 2020-09-17 MED ORDER — LIDOCAINE HCL (PF) 1 % IJ SOLN
INTRAMUSCULAR | Status: AC
  Filled 2020-09-17: qty 30

## 2020-09-17 MED ORDER — HEPARIN (PORCINE) IN NACL 1000-0.9 UT/500ML-% IV SOLN
INTRAVENOUS | Status: DC | PRN
  Administered 2020-09-17 (×2): 500 mL

## 2020-09-17 MED ORDER — MIDAZOLAM HCL 2 MG/2ML IJ SOLN
INTRAMUSCULAR | Status: AC
  Filled 2020-09-17: qty 2

## 2020-09-17 MED ORDER — SODIUM CHLORIDE 0.9 % WEIGHT BASED INFUSION: 1.0000 mL/kg/h | INTRAVENOUS | Status: DC

## 2020-09-17 MED ORDER — MIDAZOLAM HCL 2 MG/2ML IJ SOLN
INTRAMUSCULAR | Status: DC | PRN
  Administered 2020-09-17: 1 mg via INTRAVENOUS

## 2020-09-17 MED ORDER — VERAPAMIL HCL 2.5 MG/ML IV SOLN
INTRAVENOUS | Status: AC
  Filled 2020-09-17: qty 2

## 2020-09-17 MED ORDER — HEPARIN SODIUM (PORCINE) 1000 UNIT/ML IJ SOLN
INTRAMUSCULAR | Status: AC
  Filled 2020-09-17: qty 1

## 2020-09-17 MED ORDER — SODIUM CHLORIDE 0.9 % IV SOLN: INTRAVENOUS | Status: DC

## 2020-09-17 MED ORDER — HEPARIN SODIUM (PORCINE) 1000 UNIT/ML IJ SOLN
INTRAMUSCULAR | Status: DC | PRN
  Administered 2020-09-17: 4000 [IU] via INTRAVENOUS

## 2020-09-17 MED ORDER — VERAPAMIL HCL 2.5 MG/ML IV SOLN
INTRAVENOUS | Status: DC | PRN
  Administered 2020-09-17: 10 mL via INTRA_ARTERIAL

## 2020-09-17 MED ORDER — HYDRALAZINE HCL 20 MG/ML IJ SOLN: 10.0000 mg | INTRAMUSCULAR | Status: DC | PRN

## 2020-09-17 MED ORDER — FENTANYL CITRATE (PF) 100 MCG/2ML IJ SOLN
INTRAMUSCULAR | Status: DC | PRN
  Administered 2020-09-17: 50 ug via INTRAVENOUS

## 2020-09-17 SURGICAL SUPPLY — 10 items
CATH 5FR JL3.5 JR4 ANG PIG MP (CATHETERS) ×1 IMPLANT
DEVICE RAD COMP TR BAND LRG (VASCULAR PRODUCTS) ×1 IMPLANT
GLIDESHEATH SLEND A-KIT 6F 22G (SHEATH) ×1 IMPLANT
KIT HEART LEFT (KITS) ×2 IMPLANT
PACK CARDIAC CATHETERIZATION (CUSTOM PROCEDURE TRAY) ×2 IMPLANT
SYR MEDRAD MARK 7 150ML (SYRINGE) ×2 IMPLANT
TRANSDUCER W/STOPCOCK (MISCELLANEOUS) ×2 IMPLANT
TUBING CIL FLEX 10 FLL-RA (TUBING) ×2 IMPLANT
WIRE EMERALD 3MM-J .035X260CM (WIRE) ×1 IMPLANT
WIRE HI TORQ VERSACORE-J 145CM (WIRE) ×1 IMPLANT

## 2020-09-17 NOTE — H&P (Signed)
Patient referred by [name], MD for syncope  Subjective:   Harry Zamora, male    DOB: [birth year], 80 y.o.   MRN: [id number]   Chief Complaint  Patient presents with   Loss of Consciousness     HPI  80 y.o. Caucasian male with uncontrolled type 2 diabetes mellitus, peripheral artery disease, spinal stenosis s/p prior surgeries, referred for evaluation of syncope.  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  He is very active, walks for 30 minutes most days, and works in the yard regularly.  At baseline, he does not have any symptoms of chest pain, shortness of breath, claudication with exertion.  He reports occasional episodes of shortness of breath just before going to bed.  He denies any orthopnea, PND, leg edema symptoms.  Patient with had an episode of syncope about 2 weeks ago.  He woke up at 6 AM, as usual, had his coffee.  He had not had his breakfast yet.  He was sitting at dining table.  He does not recollect having any chest pain, shortness of breath, lightheadedness symptoms.  Next thing he remembers that he was down on the floor with his wife standing next to him.  His wife called his nephew, who assisted him to sit up.  The entire episode lasted for less than a minute.  On waking up, patient did not have any confusion, loss of bowel or bladder tone, tongue bite, seizures.  Patient did not have any other symptoms rest of the day.  He has not had any recurrence of similar episodes since then.  On a separate note, patient tells me that he has had spinal surgeries in the past, and was going to undergo surgery for removal of the rods today.  However, surgery was postponed given his syncope episode, as well as uncontrolled diabetes with A1c of 9.8%.  Patient denies any claudication symptoms.  However, he has noted an area on his second toe left foot.  This started as a area of redness which he reportedly tried to pick at.  Now the area is black and discolored.   He denies any pain at the site.   Discussed echocardiogram, aorta duplex, lower extremity ultrasound and stress test findings with the patient.  Given his upcoming spine surgery and abnormal stress test, recommend coronary angiogram for definitive evaluation.  In the meantime, I have started him on metoprolol succinate 25 mg daily, Plavix 75 mg daily, and nitroglycerin as needed.  Cardiac telemetry results are still pending.   Schedule for cardiac catheterization, and possible angioplasty. We discussed regarding risks, benefits, alternatives to this including stress testing, CTA and continued medical therapy. Patient wants to proceed. Understands <1-2% risk of death, stroke, MI, urgent CABG, bleeding, infection, renal failure but not limited to these.  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   As per the discharge summary, "CT does show aortic ulcer, discussed with PA-C Ryan of CT surgery who spoke with Dr. Cyndia Bent, no acute CT surgery recommendations indicated, recommend 47moCTA, follow up with CT surgery as an outpatient"   Past Medical History:  Diagnosis Date   Anemia    as an infant   Arthritis    Back pain    Depression    Disc displacement, lumbar    Gynecomastia    History of kidney stones  Hyperlipidemia    Hypertension    no longer on medications   Hypertriglyceridemia    Insomnia    Low back pain    Lumbar radiculopathy    Lumbar stenosis    OA (osteoarthritis)    Polyneuropathy in diabetes(357.2)    Prostate cancer ([facility]) 2003   Restless legs    Ringing in ears    RLS (restless legs syndrome)    Type 2 diabetes mellitus ([place])      Past Surgical History:  Procedure Laterality Date   ABDOMINAL EXPOSURE N/A 07/13/2019   Procedure: ABDOMINAL EXPOSURE;  Surgeon: Serafina Mitchell, MD;  Location: [place];  Service: Vascular;   Laterality: N/A;  anterior approach   ANTERIOR LAT LUMBAR FUSION Right 07/13/2019   Procedure: Right Lumbar 3-4 Lumbar 4-5 Anterolateral lumbar interbody fusion;  Surgeon: Erline Levine, MD;  Location: [facility];  Service: Neurosurgery;  Laterality: Right;   ANTERIOR LUMBAR FUSION N/A 07/13/2019   Procedure: Lumbar Five-Sacral One Anterior lumbar interbody fusion;  Surgeon: Erline Levine, MD;  Location: [facility];  Service: Neurosurgery;  Laterality: N/A;  Anterior approach   CHOLECYSTECTOMY     COLONOSCOPY     EYE SURGERY Bilateral    GALLBLADDER SURGERY     HEMORRHOID SURGERY  1983   LUMBAR LAMINECTOMY/DECOMPRESSION MICRODISCECTOMY N/A 10/02/2016   Procedure: L3 to S1 Laminectomy;  Surgeon: Erline Levine, MD;  Location: [facility];  Service: Neurosurgery;  Laterality: N/A;  L3 to S1 Laminectomy   LUMBAR LAMINECTOMY/DECOMPRESSION MICRODISCECTOMY Bilateral 09/07/2019   Procedure: Bilateral Lumbar Five - Sacral One Foraminotomy;  Surgeon: Erline Levine, MD;  Location: [facility];  Service: Neurosurgery;  Laterality: Bilateral;  posterior   LUMBAR PERCUTANEOUS PEDICLE SCREW 3 LEVEL N/A 07/13/2019   Procedure: Percutaneous pedicle screw fixation from Lumbar 3 to Sacral 1;  Surgeon: Erline Levine, MD;  Location: [facility];  Service: Neurosurgery;  Laterality: N/A;   MENISCUS REPAIR Right    PROSTATE SURGERY     SHOULDER SURGERY Left 01/2011   rotator cuff     Social History   Tobacco Use  Smoking Status Light Tobacco Smoker   Types: Cigars  Smokeless Tobacco Never Used  Tobacco Comment   occasional    Social History   Substance and Sexual Activity  Alcohol Use Not Currently   Comment: social-beer     Family History  Problem Relation Age of Onset   Arthritis Mother    Hypertension Mother    Diabetes Mother    Heart attack Mother    Cancer Father      Current Outpatient Medications on File Prior to Visit  Medication Sig Dispense Refill   Dulaglutide (TRULICITY) 1.5 PZ/0.2HE SOPN Inject into the  skin.     FARXIGA 10 MG TABS tablet Take 10 mg by mouth daily.      gabapentin (NEURONTIN) 300 MG capsule Take 300 mg by mouth 2 (two) times daily.     lovastatin (MEVACOR) 40 MG tablet Take 40 mg by mouth at bedtime.      meloxicam (MOBIC) 15 MG tablet Take 15 mg by mouth daily.     metFORMIN (GLUCOPHAGE) 500 MG tablet Take 1,000 mg by mouth 2 (two) times daily with a meal.   1   Multiple Vitamin (MULTIVITAMIN WITH MINERALS) TABS tablet Take 1 tablet by mouth daily.     rOPINIRole (REQUIP) 4 MG tablet Take 4 mg by mouth at bedtime.     vitamin B-12 (CYANOCOBALAMIN) 500 MCG tablet Take 500 mcg by  mouth daily.     No current facility-administered medications on file prior to visit.    Cardiovascular and other pertinent studies:  CTA 09/12/2020: No acute abnormality chest, abdomen or pelvis. Negative for aortic dissection or aneurysm.   The celiac axis opacifies but there appears to be high-grade stenosis at its takeoff from the aorta.   Calcific aortic and coronary atherosclerosis. Small penetrating ulcer aortic arch.   Fatty infiltration of the liver.   Moderately large colonic stool burden throughout.  Echocardiogram 08/28/2020:    Mildly depressed LV systolic function with EF 50%. Left ventricle cavity  is normal in size. Mild concentric hypertrophy of the left ventricle.  Normal global wall motion. Doppler evidence of grade I (impaired)  diastolic dysfunction, elevated LAP. Calculated EF 50%.  Left atrial cavity is mildly dilated by volume index.  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  Exercise Tetrofosmin stress test 08/28/2020: Exercise nuclear stress test was performed using Bruce protocol. Patient reached 4.6 METS, and 93% of age predicted maximum heart rate. Exercise capacity was low. No chest pain reported. Heart rate and hemodynamic response were normal. Stress EKG showed sinus tachycardia, old anteroseptal infarct, no significant ST-T changes. SPECT images show  small sized, severe intensity, reversible perfusion defect in apical anterior myocardium, with associated mild hypokinesis. Stress LVEF 47%. High risk study.  Lower Extremity Arterial Duplex 08/28/2020:  No hemodynamically significant stenoses are identified in the right or the  left lower extremity arterial system.  There is monophasic waveform in the bilateral AT and PT vessels and left  PT has spectral broadening suggestive of severe diffuse disease.  This exam reveals moderately decreased perfusion of the right lower  extremity, noted at the anterior tibial artery level (ABI 0.73) and  moderately decreased perfusion of the left lower extremity, noted at the  post tibial artery level (ABI 0.73).   EKG 08/08/2020: Sinus rhythm 76 bpm Cannot exclude old anteroseptal infarct  CT lumbar spine 06/26/2020: 1. Prior L3-S1 PLIF with fracture of the left S1 anterior screw and loosening of the right S1 anterior and posterior screws. The right posterior screw traverses the right L5-S1 facet joint. No significant interbody fusion at any level. 2. Residual moderate right neuroforaminal stenosis at L5-S1. Asymmetric underfilling of the right S1 nerve root with suspected scar tissue surrounding the nerve root in the lateral recess 3. Progressive adjacent segment disease at L2-L3 with new moderate spinal canal and right neuroforaminal stenosis. 4. Nonobstructive right nephrolithiasis. 5. Aortic Atherosclerosis (ICD10-I70.0).     ABI 08/30/2019: Right: Velocities suggest 50-74% stenosis in the proximal PTA and 30-49%  stenosis in the proximal Peroneal a. Tibial vessel disease.  Left: No obvious evidence of stenosis. Tibial vessel disease.    Recent labs: 08/05/2020: Glucose 232, BUN/Cr 28/1.26. EGFR 58. Na/K 137/4.4.  H/H 13/42. MCV 96. Platelets 206 HbA1C 9.5% Lipid panel N/A TSH N/A  09/20/2019: Chol 123, TG 183, HDL 32, LDL 54   Review of Systems  Constitutional: Negative for  decreased appetite, malaise/fatigue, weight gain and weight loss.  HENT:  Negative for congestion.   Eyes:  Negative for visual disturbance.  Cardiovascular:  Positive for syncope. Negative for chest pain, dyspnea on exertion, leg swelling and palpitations.  Respiratory:  Negative for cough.   Endocrine: Negative for cold intolerance.  Hematologic/Lymphatic: Does not bruise/bleed easily.  Skin:  Negative for itching and rash.  Musculoskeletal:  Negative for myalgias.  Gastrointestinal:  Negative for abdominal pain, nausea and vomiting.  Genitourinary:  Negative for dysuria.  Neurological:  Negative for dizziness and weakness.  Psychiatric/Behavioral:  The patient is not nervous/anxious.   All other systems reviewed and are negative.       Vitals:   08/08/20 0851 08/08/20 0852  Temp:  98.4 F (36.9 C)  SpO2: 97% 100%   Orthostatic VS for the past 72 hrs (Last 3 readings):  Orthostatic BP Patient Position BP Location Cuff Size Orthostatic Pulse  08/08/20 0852 129/68 Standing Left Arm Normal 85  08/08/20 0851 128/66 Sitting Left Arm Normal 77  08/08/20 0843 139/76 Supine Left Arm Normal 76     Body mass index is 23.75 kg/m. Filed Weights   08/08/20 0843  Weight: 185 lb (83.9 kg)     Objective:   Physical Exam Vitals and nursing note reviewed.  Constitutional:      General: He is not in acute distress. Neck:     Vascular: No JVD.  Cardiovascular:     Rate and Rhythm: Normal rate and regular rhythm.     Pulses:          Femoral pulses are 2+ on the right side and 2+ on the left side.      Popliteal pulses are 2+ on the right side and 1+ on the left side.       Dorsalis pedis pulses are 2+ on the right side and 0 on the left side.       Posterior tibial pulses are 0 on the right side and 0 on the left side.     Heart sounds: Normal heart sounds. No murmur heard. Pulmonary:     Effort: Pulmonary effort is normal.     Breath sounds: Normal breath sounds. No wheezing  or rales.  Abdominal:     General: There is no distension.  Musculoskeletal:     Right lower leg: No edema.     Left lower leg: No edema.  Skin:    Comments: 2 mm black eschar on left foot second toe  Neurological:     General: No focal deficit present.     Mental Status: He is alert and oriented to person, place, and time.    Media Information        Assessment & Recommendations:   80 y.o. Caucasian male with uncontrolled type 2 diabetes mellitus, peripheral artery disease, spinal stenosis s/p prior surgeries, referred for evaluation of syncope.  Syncope: Episode of syncope with no warning signs in 80 year old diabetic patient is concerning for cardiac etiology. Given abnormal stress test as detailed above, recommend coronary angiogram.  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 Given radial access, we will avoid this area during coronary angiogram    Nigel Mormon, MD Pager: [phone] Office: [phone]

## 2020-09-17 NOTE — Interval H&P Note (Signed)
History and Physical Interval Note:  09/17/2020 4:03 PM  Harry Zamora [facility]  has presented today for surgery, with the diagnosis of abnormal stress test - syncope.  The various methods of treatment have been discussed with the patient and family. After consideration of risks, benefits and other options for treatment, the patient has consented to  Procedure(s): LEFT HEART CATH AND CORONARY ANGIOGRAPHY (N/A) as a surgical intervention.  The patient's history has been reviewed, patient examined, no change in status, stable for surgery.  I have reviewed the patient's chart and labs.  Questions were answered to the patient's satisfaction.    2012 Appropriate Use Criteria for Diagnostic Catheterization CAD Assessment (Coronary Angiography With or Without Left Heart Catheterization and/or Left Ventriculography) Indication:  Suspected CAD (No Prior PCI, No Prior CABG, and No Prior Angiogram Showing > = 50% Angiographic Stenosis) Prior Noninvasive Testing: Stress Test With Imaging (SPECT MPI, Stress Echocardiography, Stress PET, Stress CMR) High-risk findings (e.g., 10% ischemic myocardium on stress SPECT MPI or stress PET, stress-induced wall motion abnormality in 2 or more segments on stress echo or stress CMR) Pretest Symptom Status: Asymptomatic A (7) Indication: 17; Score 7 7  Harry Zamora Harry Zamora

## 2020-09-17 NOTE — Progress Notes (Signed)
Pt ambulated without difficulty or bleeding.   Discharged home with his wife who will drive and stay with pt x 24 hrs. 

## 2020-09-17 NOTE — Progress Notes (Signed)
Pt had food at 0815 (per pt) Dr Einar Gip wants to wait 4-6 hours . Pt  and his wife informed.

## 2020-09-17 NOTE — Progress Notes (Signed)
Mobile cardiac telemetry 11 days 08/28/2020 - 09/09/2020: Dominant rhythm: Sinus. HR 57-123 bpm. Avg HR 78 bpm, while in sinus rhythm. 17 episodes of SVT, fastest at 207 bpm for 13 beats, longest for 20 beats at 140 bpm. <1% isolated SVE, couplet/triplets. 1 episode of VT, at 162 bpm for 5 beats 2.2% isolated VE, <1% couplet No atrial fibrillation/atrial flutter/high grade AV block, sinus pause >3sec noted. 5 patient triggered events, all correlated with sinus rhythm.  Suspect his symptoms are probably related to brief atrial tachycardias, PACs and PVCs.

## 2020-09-17 NOTE — Progress Notes (Signed)
MP did you read this. It is not showing up in my in box to read

## 2020-09-18 ENCOUNTER — Encounter (HOSPITAL_COMMUNITY): Payer: Self-pay | Admitting: Cardiology

## 2020-09-19 ENCOUNTER — Telehealth: Payer: Self-pay | Admitting: Cardiology

## 2020-09-19 DIAGNOSIS — I25118 Atherosclerotic heart disease of native coronary artery with other forms of angina pectoris: Secondary | ICD-10-CM | POA: Insufficient documentation

## 2020-09-19 DIAGNOSIS — I2510 Atherosclerotic heart disease of native coronary artery without angina pectoris: Secondary | ICD-10-CM | POA: Insufficient documentation

## 2020-09-19 NOTE — Telephone Encounter (Signed)
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  On further questioning, he does report retrosternal burning episodes, along with exertional dyspnea.  Patient has an upcoming decompression/fusion L2-L3 exploration/revision of L3-S1 fusion with pelvic screw fixation surgery.  I spoke with patient's surgeon Dr. Vertell Limber and expressed my concerns of elevated perioperative cardiac risk given the location of his severe stenosis.  We both agreed that we should not delay his spine surgery until this is addressed with revascularization.  Given patient's distal aorta arch ulcer and severe aortic calcification, I do not think CABG is a good option.  I offered complex PCI to ostial and proximal LAD, in addition to ongoing medical therapy.  After PCI, I recommend at least 3-6 months of uninterrupted dual antiplatelet therapy prior to his spine surgery.  I quoted risk of complications, including but not limited to, bleeding, infection, acute vessel closure, acute myocardial infarction, life-threatening arrhythmias, contrast-induced nephropathy, acute heart failure, death of 3-4%.  Patient understands the benefits/risks and would like to proceed.  We will schedule in the coming 2 weeks.   Nigel Mormon, MD Pager: [phone] Office: [phone]

## 2020-10-01 ENCOUNTER — Ambulatory Visit (HOSPITAL_COMMUNITY)
Admission: RE | Admit: 2020-10-01 | Discharge: 2020-10-01 | Disposition: A | Discharge status: Home / Self Care | Payer: Medicare Other | Attending: Cardiology | Admitting: Cardiology

## 2020-10-01 ENCOUNTER — Encounter (HOSPITAL_COMMUNITY)
Admission: RE | Disposition: A | Discharge status: Home / Self Care | Payer: Self-pay | Source: Home / Self Care | Attending: Cardiology

## 2020-10-01 DIAGNOSIS — E1165 Type 2 diabetes mellitus with hyperglycemia: Secondary | ICD-10-CM | POA: Insufficient documentation

## 2020-10-01 DIAGNOSIS — Z8546 Personal history of malignant neoplasm of prostate: Secondary | ICD-10-CM | POA: Diagnosis not present

## 2020-10-01 DIAGNOSIS — Z95820 Peripheral vascular angioplasty status with implants and grafts: Secondary | ICD-10-CM

## 2020-10-01 DIAGNOSIS — Z9049 Acquired absence of other specified parts of digestive tract: Secondary | ICD-10-CM | POA: Insufficient documentation

## 2020-10-01 DIAGNOSIS — I2510 Atherosclerotic heart disease of native coronary artery without angina pectoris: Secondary | ICD-10-CM | POA: Insufficient documentation

## 2020-10-01 DIAGNOSIS — Z8249 Family history of ischemic heart disease and other diseases of the circulatory system: Secondary | ICD-10-CM | POA: Diagnosis not present

## 2020-10-01 DIAGNOSIS — Z7902 Long term (current) use of antithrombotics/antiplatelets: Secondary | ICD-10-CM | POA: Insufficient documentation

## 2020-10-01 DIAGNOSIS — Z7984 Long term (current) use of oral hypoglycemic drugs: Secondary | ICD-10-CM | POA: Diagnosis not present

## 2020-10-01 DIAGNOSIS — Z833 Family history of diabetes mellitus: Secondary | ICD-10-CM | POA: Diagnosis not present

## 2020-10-01 DIAGNOSIS — E1151 Type 2 diabetes mellitus with diabetic peripheral angiopathy without gangrene: Secondary | ICD-10-CM | POA: Insufficient documentation

## 2020-10-01 DIAGNOSIS — I25118 Atherosclerotic heart disease of native coronary artery with other forms of angina pectoris: Secondary | ICD-10-CM | POA: Insufficient documentation

## 2020-10-01 DIAGNOSIS — E785 Hyperlipidemia, unspecified: Secondary | ICD-10-CM | POA: Insufficient documentation

## 2020-10-01 DIAGNOSIS — F17290 Nicotine dependence, other tobacco product, uncomplicated: Secondary | ICD-10-CM | POA: Diagnosis not present

## 2020-10-01 DIAGNOSIS — Z809 Family history of malignant neoplasm, unspecified: Secondary | ICD-10-CM | POA: Insufficient documentation

## 2020-10-01 DIAGNOSIS — E1142 Type 2 diabetes mellitus with diabetic polyneuropathy: Secondary | ICD-10-CM | POA: Diagnosis not present

## 2020-10-01 DIAGNOSIS — Z79899 Other long term (current) drug therapy: Secondary | ICD-10-CM | POA: Diagnosis not present

## 2020-10-01 HISTORY — PX: CORONARY STENT INTERVENTION: CATH118234

## 2020-10-01 LAB — CBC
HCT: 38.2 % — ABNORMAL LOW (ref 39.0–52.0)
Hemoglobin: 12.3 g/dL — ABNORMAL LOW (ref 13.0–17.0)
MCH: 31.2 pg (ref 26.0–34.0)
MCHC: 32.2 g/dL (ref 30.0–36.0)
MCV: 97 fL (ref 80.0–100.0)
Platelets: 234 10*3/uL (ref 150–400)
RBC: 3.94 MIL/uL — ABNORMAL LOW (ref 4.22–5.81)
RDW: 14.3 % (ref 11.5–15.5)
WBC: 5 10*3/uL (ref 4.0–10.5)
nRBC: 0 % (ref 0.0–0.2)

## 2020-10-01 LAB — BASIC METABOLIC PANEL
Anion gap: 5 (ref 5–15)
BUN: 31 mg/dL — ABNORMAL HIGH (ref 8–23)
CO2: 28 mmol/L (ref 22–32)
Calcium: 9.6 mg/dL (ref 8.9–10.3)
Chloride: 104 mmol/L (ref 98–111)
Creatinine, Ser: 1.38 mg/dL — ABNORMAL HIGH (ref 0.61–1.24)
GFR, Estimated: 51 mL/min — ABNORMAL LOW (ref >60)
Glucose, Bld: 169 mg/dL — ABNORMAL HIGH (ref 70–99)
Potassium: 5.8 mmol/L — ABNORMAL HIGH (ref 3.5–5.1)
Sodium: 137 mmol/L (ref 135–145)

## 2020-10-01 LAB — GLUCOSE, CAPILLARY
Glucose-Capillary: 141 mg/dL — ABNORMAL HIGH (ref 70–99)
Glucose-Capillary: 81 mg/dL (ref 70–99)

## 2020-10-01 LAB — POCT ACTIVATED CLOTTING TIME
Activated Clotting Time: 665 seconds
Activated Clotting Time: 300 seconds
Activated Clotting Time: 167 seconds

## 2020-10-01 SURGERY — CORONARY STENT INTERVENTION
Anesthesia: LOCAL

## 2020-10-01 MED ORDER — NITROGLYCERIN 1 MG/10 ML FOR IR/CATH LAB
INTRA_ARTERIAL | Status: AC
  Filled 2020-10-01: qty 10

## 2020-10-01 MED ORDER — SODIUM CHLORIDE 0.9% FLUSH: 3.0000 mL | INTRAVENOUS | Status: DC | PRN

## 2020-10-01 MED ORDER — LABETALOL HCL 5 MG/ML IV SOLN: 10.0000 mg | INTRAVENOUS | Status: DC | PRN

## 2020-10-01 MED ORDER — ONDANSETRON HCL 4 MG/2ML IJ SOLN: 4.0000 mg | Freq: Four times a day (QID) | INTRAMUSCULAR | Status: DC | PRN

## 2020-10-01 MED ORDER — CLOPIDOGREL BISULFATE 300 MG PO TABS
ORAL_TABLET | ORAL | Status: AC
  Filled 2020-10-01: qty 1

## 2020-10-01 MED ORDER — MIDAZOLAM HCL 2 MG/2ML IJ SOLN
INTRAMUSCULAR | Status: AC
  Filled 2020-10-01: qty 2

## 2020-10-01 MED ORDER — SODIUM CHLORIDE 0.9 % WEIGHT BASED INFUSION
1.0000 mL/kg/h | INTRAVENOUS | Status: DC
  Administered 2020-10-01: 250 mL via INTRAVENOUS

## 2020-10-01 MED ORDER — MIDAZOLAM HCL 2 MG/2ML IJ SOLN
INTRAMUSCULAR | Status: DC | PRN
  Administered 2020-10-01 (×2): 1 mg via INTRAVENOUS

## 2020-10-01 MED ORDER — ASPIRIN 81 MG PO CHEW
81.0000 mg | CHEWABLE_TABLET | ORAL | Status: AC
  Administered 2020-10-01: 81 mg via ORAL
  Filled 2020-10-01: qty 1

## 2020-10-01 MED ORDER — HEPARIN SODIUM (PORCINE) 1000 UNIT/ML IJ SOLN
INTRAMUSCULAR | Status: AC
  Filled 2020-10-01: qty 1

## 2020-10-01 MED ORDER — HEPARIN (PORCINE) IN NACL 1000-0.9 UT/500ML-% IV SOLN
INTRAVENOUS | Status: AC
  Filled 2020-10-01: qty 1000

## 2020-10-01 MED ORDER — LIDOCAINE HCL (PF) 1 % IJ SOLN
INTRAMUSCULAR | Status: AC
  Filled 2020-10-01: qty 30

## 2020-10-01 MED ORDER — LIDOCAINE HCL (PF) 1 % IJ SOLN
INTRAMUSCULAR | Status: DC | PRN
  Administered 2020-10-01: 2 mL via INTRADERMAL

## 2020-10-01 MED ORDER — FENTANYL CITRATE (PF) 100 MCG/2ML IJ SOLN
INTRAMUSCULAR | Status: DC | PRN
  Administered 2020-10-01 (×2): 50 ug via INTRAVENOUS

## 2020-10-01 MED ORDER — CLOPIDOGREL BISULFATE 300 MG PO TABS
ORAL_TABLET | ORAL | Status: DC | PRN
  Administered 2020-10-01: 300 mg via ORAL

## 2020-10-01 MED ORDER — INSULIN ASPART 100 UNIT/ML IJ SOLN
INTRAMUSCULAR | Status: AC
  Filled 2020-10-01: qty 1

## 2020-10-01 MED ORDER — SODIUM CHLORIDE 0.9 % IV SOLN: 250.0000 mL | INTRAVENOUS | Status: DC | PRN

## 2020-10-01 MED ORDER — IOHEXOL 350 MG/ML SOLN
INTRAVENOUS | Status: DC | PRN
  Administered 2020-10-01: 175 mL

## 2020-10-01 MED ORDER — ASPIRIN EC 81 MG PO TBEC: 81.0000 mg | DELAYED_RELEASE_TABLET | Freq: Every day | ORAL | 3 refills | Status: DC

## 2020-10-01 MED ORDER — DEXTROSE 50 % IV SOLN
1.0000 | Freq: Once | INTRAVENOUS | Status: AC
  Administered 2020-10-01: 50 mL via INTRAVENOUS
  Filled 2020-10-01: qty 50

## 2020-10-01 MED ORDER — METFORMIN HCL 500 MG PO TABS: 1000.0000 mg | ORAL_TABLET | Freq: Two times a day (BID) | ORAL | 1 refills | Status: DC

## 2020-10-01 MED ORDER — HEPARIN SODIUM (PORCINE) 1000 UNIT/ML IJ SOLN
INTRAMUSCULAR | Status: DC | PRN
  Administered 2020-10-01: 9000 [IU] via INTRAVENOUS
  Administered 2020-10-01: 3000 [IU] via INTRAVENOUS

## 2020-10-01 MED ORDER — VERAPAMIL HCL 2.5 MG/ML IV SOLN
INTRAVENOUS | Status: DC | PRN
  Administered 2020-10-01: 10 mL via INTRA_ARTERIAL

## 2020-10-01 MED ORDER — NITROGLYCERIN 1 MG/10 ML FOR IR/CATH LAB
INTRA_ARTERIAL | Status: DC | PRN
  Administered 2020-10-01: 200 ug via INTRA_ARTERIAL

## 2020-10-01 MED ORDER — SODIUM CHLORIDE 0.9% FLUSH: 3.0000 mL | Freq: Two times a day (BID) | INTRAVENOUS | Status: DC

## 2020-10-01 MED ORDER — HYDRALAZINE HCL 20 MG/ML IJ SOLN: 10.0000 mg | INTRAMUSCULAR | Status: DC | PRN

## 2020-10-01 MED ORDER — VERAPAMIL HCL 2.5 MG/ML IV SOLN
INTRAVENOUS | Status: AC
  Filled 2020-10-01: qty 2

## 2020-10-01 MED ORDER — SODIUM CHLORIDE 0.9 % WEIGHT BASED INFUSION
3.0000 mL/kg/h | INTRAVENOUS | Status: AC
  Administered 2020-10-01: 3 mL/kg/h via INTRAVENOUS

## 2020-10-01 MED ORDER — FENTANYL CITRATE (PF) 100 MCG/2ML IJ SOLN
INTRAMUSCULAR | Status: AC
  Filled 2020-10-01: qty 2

## 2020-10-01 MED ORDER — SODIUM CHLORIDE 0.9 % IV SOLN: INTRAVENOUS | Status: AC

## 2020-10-01 MED ORDER — INSULIN ASPART 100 UNIT/ML IV SOLN
5.0000 [IU] | Freq: Once | INTRAVENOUS | Status: AC
  Administered 2020-10-01: 5 [IU] via INTRAVENOUS
  Filled 2020-10-01: qty 0.05

## 2020-10-01 MED ORDER — ACETAMINOPHEN 325 MG PO TABS: 650.0000 mg | ORAL_TABLET | ORAL | Status: DC | PRN

## 2020-10-01 MED ORDER — HEPARIN (PORCINE) IN NACL 1000-0.9 UT/500ML-% IV SOLN
INTRAVENOUS | Status: DC | PRN
  Administered 2020-10-01 (×2): 500 mL

## 2020-10-01 MED ORDER — IODIXANOL 320 MG/ML IV SOLN: INTRAVENOUS | Status: DC | PRN

## 2020-10-01 SURGICAL SUPPLY — 24 items
BALLN SAPPHIRE ~~LOC~~ 2.75X18 (BALLOONS) ×1 IMPLANT
BALLN SAPPHIRE ~~LOC~~ 3.5X15 (BALLOONS) ×1 IMPLANT
BALLN SCOREFLEX 2.50X10 (BALLOONS) ×2
BALLN SCOREFLEX 3.0X15 (BALLOONS) ×2
BALLOON SCOREFLEX 2.50X10 (BALLOONS) IMPLANT
BALLOON SCOREFLEX 3.0X15 (BALLOONS) IMPLANT
CATH LAUNCHER 6FR EBU3.5 (CATHETERS) ×1 IMPLANT
DEVICE RAD COMP TR BAND LRG (VASCULAR PRODUCTS) ×1 IMPLANT
DEVICE TORQUE .014-.018 (MISCELLANEOUS) IMPLANT
ELECT DEFIB PAD ADLT CADENCE (PAD) ×1 IMPLANT
GLIDESHEATH SLEND A-KIT 6F 22G (SHEATH) ×1 IMPLANT
GUIDEWIRE INQWIRE 1.5J.035X260 (WIRE) IMPLANT
INQWIRE 1.5J .035X260CM (WIRE) ×2
KIT ENCORE 26 ADVANTAGE (KITS) ×2 IMPLANT
KIT HEART LEFT (KITS) ×2 IMPLANT
PACK CARDIAC CATHETERIZATION (CUSTOM PROCEDURE TRAY) ×2 IMPLANT
STENT ONYX FRONTIER 2.5X26 (Permanent Stent) ×1 IMPLANT
STENT ONYX FRONTIER 3.0X38 (Permanent Stent) ×1 IMPLANT
TORQUE DEVICE .014-.018 (MISCELLANEOUS) ×2
TRANSDUCER W/STOPCOCK (MISCELLANEOUS) ×2 IMPLANT
TUBING CIL FLEX 10 FLL-RA (TUBING) ×2 IMPLANT
VALVE COPILOT STAT (MISCELLANEOUS) ×1 IMPLANT
WIRE ASAHI PROWATER 180CM (WIRE) ×2 IMPLANT
WIRE COUGAR XT STRL 190CM (WIRE) ×1 IMPLANT

## 2020-10-01 NOTE — Interval H&P Note (Signed)
History and Physical Interval Note:  10/01/2020 10:00 AM  Harry Zamora  has presented today for surgery, with the diagnosis of cad.  The various methods of treatment have been discussed with the patient and family. After consideration of risks, benefits and other options for treatment, the patient has consented to  Procedure(s): CORONARY STENT INTERVENTION (N/A) as a surgical intervention.  The patient's history has been reviewed, patient examined, no change in status, stable for surgery.  I have reviewed the patient's chart and labs.  Questions were answered to the patient's satisfaction.    2016/2017 Appropriate Use Criteria for Coronary Revascularization Symptom Status: Ischemic Symptoms  Non-invasive Testing: High risk  If no or indeterminate stress test, FFR/iFR results in all diseased vessels: N/A  Diabetes Mellitus: Yes  S/P CABG: No  Antianginal therapy (number of long-acting drugs): >=2  Patient undergoing renal transplant: No  Patient undergoing percutaneous valve procedure: No  1 Vessel Disease PCI CABG  No proximal LAD involvement, No proximal left dominant LCX involvement A (8); Indication 2 M (6); Indication 2  Proximal left dominant LCX involvement A (8); Indication 5 A (8); Indication 5  Proximal LAD involvement A (8); Indication 5 A (8); Indication 5  2 Vessel Disease  No proximal LAD involvement A (8); Indication 8 A (7); Indication 8  Proximal LAD involvement A (8); Indication 14 A (9); Indication 14  3 Vessel Disease  Low disease complexity (e.g., focal stenoses, SYNTAX <=22) A (7); Indication 19 A (9); Indication 19  Intermediate or high disease complexity (e.g., SYNTAX >=23) M (6); Indication 23 A (9); Indication 23  Left Main Disease  Isolated LMCA disease: ostial or midshaft A (7); Indication 24 A (9); Indication 24  Isolated LMCA disease: bifurcation involvement M (6); Indication 25 A (9); Indication 25  LMCA ostial or midshaft, concurrent low disease burden  multivessel disease (e.g., 1-2 additional focal stenoses, SYNTAX <=22) A (7); Indication 26 A (9); Indication 26  LMCA ostial or midshaft, concurrent intermediate or high disease burden multivessel disease (e.g., 1-2 additional bifurcation stenoses, long stenoses, SYNTAX >=23) M (4); Indication 27 A (9); Indication 27  LMCA bifurcation involvement, concurrent low disease burden multivessel disease (e.g., 1-2 additional focal stenoses, SYNTAX <=22) M (6); Indication 28 A (9); Indication 28  LMCA bifurcation involvement, concurrent intermediate or high disease burden multivessel disease (e.g., 1-2 additional bifurcation stenoses, long stenoses, SYNTAX >=23) R (3); Indication 29 A (9); Indication [place]

## 2020-10-01 NOTE — Progress Notes (Signed)
[id number] Education completed with pt and wife who voiced understanding. Stressed importance of plavix with stent. Reviewed NTG use, walking for ex, gave diabetic and heart healthy diets and discussed CRP 2. Referral to [facility]. Pt will consider program. He needs spinal surgery in future. Pt stated around January.Graylon Good RN BSN 10/01/2020 2:21 PM

## 2020-10-01 NOTE — H&P (Signed)
Patient referred by [name], MD for syncope  Subjective:   Harry Zamora, male    DOB: 1940-06-05, 80 y.o.   MRN: [id number]   Chief Complaint  Patient presents with   Loss of Consciousness     HPI  80 y.o. Caucasian male with uncontrolled type 2 diabetes mellitus, peripheral artery disease, spinal stenosis s/p prior surgeries, stable distal aortic arch ulcer, syncope, severe CAD with stable angina  Patient has severe obstructive CAD from ostial to proximal LAD.  On further questioning, he does report retrosternal burning episodes, along with exertional dyspnea.   Patient has an upcoming decompression/fusion L2-L3 exploration/revision of L3-S1 fusion with pelvic screw fixation surgery.  I spoke with patient's surgeon Dr. Vertell Limber and expressed my concerns of elevated perioperative cardiac risk given the location of his severe stenosis.  We both agreed that we should not delay his spine surgery until this is addressed with revascularization.  Given patient's distal aorta arch ulcer and severe aortic calcification, I do not think CABG is a good option.  I offered complex PCI to ostial and proximal LAD, in addition to ongoing medical therapy.  After PCI, I recommend at least 3-6 months of uninterrupted dual antiplatelet therapy prior to his spine surgery.  I quoted risk of complications, including but not limited to, bleeding, infection, acute vessel closure, acute myocardial infarction, life-threatening arrhythmias, contrast-induced nephropathy, acute heart failure, death of 3-4%.   Patient understands the benefits/risks and would like to proceed.h CT surgery as an outpatient"   Past Medical History:  Diagnosis Date   Anemia    as an infant   Arthritis    Back pain    Depression    Disc displacement, lumbar    Gynecomastia    History of kidney stones    Hyperlipidemia    Hypertension    no longer on medications   Hypertriglyceridemia    Insomnia    Low back pain    Lumbar  radiculopathy    Lumbar stenosis    OA (osteoarthritis)    Polyneuropathy in diabetes(357.2)    Prostate cancer ([place]) 2003   Restless legs    Ringing in ears    RLS (restless legs syndrome)    Type 2 diabetes mellitus ([place])      Past Surgical History:  Procedure Laterality Date   ABDOMINAL EXPOSURE N/A 07/13/2019   Procedure: ABDOMINAL EXPOSURE;  Surgeon: Serafina Mitchell, MD;  Location: [facility];  Service: Vascular;  Laterality: N/A;  anterior approach   ANTERIOR LAT LUMBAR FUSION Right 07/13/2019   Procedure: Right Lumbar 3-4 Lumbar 4-5 Anterolateral lumbar interbody fusion;  Surgeon: Erline Levine, MD;  Location: [facility];  Service: Neurosurgery;  Laterality: Right;   ANTERIOR LUMBAR FUSION N/A 07/13/2019   Procedure: Lumbar Five-Sacral One Anterior lumbar interbody fusion;  Surgeon: Erline Levine, MD;  Location: [facility];  Service: Neurosurgery;  Laterality: N/A;  Anterior approach   CHOLECYSTECTOMY     COLONOSCOPY     EYE SURGERY Bilateral    GALLBLADDER SURGERY     HEMORRHOID SURGERY  1983   LUMBAR LAMINECTOMY/DECOMPRESSION MICRODISCECTOMY N/A 10/02/2016   Procedure: L3 to S1 Laminectomy;  Surgeon: Erline Levine, MD;  Location: [facility];  Service: Neurosurgery;  Laterality: N/A;  L3 to S1 Laminectomy   LUMBAR LAMINECTOMY/DECOMPRESSION MICRODISCECTOMY Bilateral 09/07/2019   Procedure: Bilateral Lumbar Five - Sacral One Foraminotomy;  Surgeon: Erline Levine, MD;  Location: [facility];  Service: Neurosurgery;  Laterality: Bilateral;  posterior   LUMBAR  PERCUTANEOUS PEDICLE SCREW 3 LEVEL N/A 07/13/2019   Procedure: Percutaneous pedicle screw fixation from Lumbar 3 to Sacral 1;  Surgeon: Erline Levine, MD;  Location: [facility];  Service: Neurosurgery;  Laterality: N/A;   MENISCUS REPAIR Right    PROSTATE SURGERY     SHOULDER SURGERY Left 01/2011   rotator cuff     Social History   Tobacco Use  Smoking Status Light Tobacco Smoker   Types: Cigars  Smokeless Tobacco Never Used  Tobacco Comment    occasional    Social History   Substance and Sexual Activity  Alcohol Use Not Currently   Comment: social-beer     Family History  Problem Relation Age of Onset   Arthritis Mother    Hypertension Mother    Diabetes Mother    Heart attack Mother    Cancer Father      Current Outpatient Medications on File Prior to Visit  Medication Sig Dispense Refill   Dulaglutide (TRULICITY) 1.5 UU/7.2ZD SOPN Inject into the skin.     FARXIGA 10 MG TABS tablet Take 10 mg by mouth daily.      gabapentin (NEURONTIN) 300 MG capsule Take 300 mg by mouth 2 (two) times daily.     lovastatin (MEVACOR) 40 MG tablet Take 40 mg by mouth at bedtime.      meloxicam (MOBIC) 15 MG tablet Take 15 mg by mouth daily.     metFORMIN (GLUCOPHAGE) 500 MG tablet Take 1,000 mg by mouth 2 (two) times daily with a meal.   1   Multiple Vitamin (MULTIVITAMIN WITH MINERALS) TABS tablet Take 1 tablet by mouth daily.     rOPINIRole (REQUIP) 4 MG tablet Take 4 mg by mouth at bedtime.     vitamin B-12 (CYANOCOBALAMIN) 500 MCG tablet Take 500 mcg by mouth daily.     No current facility-administered medications on file prior to visit.    Cardiovascular and other pertinent studies:  CTA 09/12/2020: No acute abnormality chest, abdomen or pelvis. Negative for aortic dissection or aneurysm.   The celiac axis opacifies but there appears to be high-grade stenosis at its takeoff from the aorta.   Calcific aortic and coronary atherosclerosis. Small penetrating ulcer aortic arch.   Fatty infiltration of the liver.   Moderately large colonic stool burden throughout.  Echocardiogram 08/28/2020:    Mildly depressed LV systolic function with EF 50%. Left ventricle cavity  is normal in size. Mild concentric hypertrophy of the left ventricle.  Normal global wall motion. Doppler evidence of grade I (impaired)  diastolic dysfunction, elevated LAP. Calculated EF 50%.  Left atrial cavity is mildly dilated by volume index.  The  aortic root is mildly dilated at 4.0 cm.  Exercise Tetrofosmin stress test 08/28/2020: Exercise nuclear stress test was performed using Bruce protocol. Patient reached 4.6 METS, and 93% of age predicted maximum heart rate. Exercise capacity was low. No chest pain reported. Heart rate and hemodynamic response were normal. Stress EKG showed sinus tachycardia, old anteroseptal infarct, no significant ST-T changes. SPECT images show small sized, severe intensity, reversible perfusion defect in apical anterior myocardium, with associated mild hypokinesis. Stress LVEF 47%. High risk study.  Lower Extremity Arterial Duplex 08/28/2020:  No hemodynamically significant stenoses are identified in the right or the  left lower extremity arterial system.  There is monophasic waveform in the bilateral AT and PT vessels and left  PT has spectral broadening suggestive of severe diffuse disease.  This exam reveals moderately decreased perfusion of the right  lower  extremity, noted at the anterior tibial artery level (ABI 0.73) and  moderately decreased perfusion of the left lower extremity, noted at the  post tibial artery level (ABI 0.73).   EKG 08/08/2020: Sinus rhythm 76 bpm Cannot exclude old anteroseptal infarct  CT lumbar spine 06/26/2020: 1. Prior L3-S1 PLIF with fracture of the left S1 anterior screw and loosening of the right S1 anterior and posterior screws. The right posterior screw traverses the right L5-S1 facet joint. No significant interbody fusion at any level. 2. Residual moderate right neuroforaminal stenosis at L5-S1. Asymmetric underfilling of the right S1 nerve root with suspected scar tissue surrounding the nerve root in the lateral recess 3. Progressive adjacent segment disease at L2-L3 with new moderate spinal canal and right neuroforaminal stenosis. 4. Nonobstructive right nephrolithiasis. 5. Aortic Atherosclerosis (ICD10-I70.0).     ABI 08/30/2019: Right: Velocities suggest  50-74% stenosis in the proximal PTA and 30-49%  stenosis in the proximal Peroneal a. Tibial vessel disease.  Left: No obvious evidence of stenosis. Tibial vessel disease.    Recent labs: 08/05/2020: Glucose 232, BUN/Cr 28/1.26. EGFR 58. Na/K 137/4.4.  H/H 13/42. MCV 96. Platelets 206 HbA1C 9.5% Lipid panel N/A TSH N/A  09/20/2019: Chol 123, TG 183, HDL 32, LDL 54   Review of Systems  Constitutional: Negative for decreased appetite, malaise/fatigue, weight gain and weight loss.  HENT:  Negative for congestion.   Eyes:  Negative for visual disturbance.  Cardiovascular:  Positive for chest pain and syncope. Negative for dyspnea on exertion, leg swelling and palpitations.  Respiratory:  Negative for cough.   Endocrine: Negative for cold intolerance.  Hematologic/Lymphatic: Does not bruise/bleed easily.  Skin:  Negative for itching and rash.  Musculoskeletal:  Positive for back pain. Negative for myalgias.  Gastrointestinal:  Negative for abdominal pain, nausea and vomiting.  Genitourinary:  Negative for dysuria.  Neurological:  Negative for dizziness and weakness.  Psychiatric/Behavioral:  The patient is not nervous/anxious.   All other systems reviewed and are negative.       Vitals:   08/08/20 0851 08/08/20 0852  Temp:  98.4 F (36.9 C)  SpO2: 97% 100%   Orthostatic VS for the past 72 hrs (Last 3 readings):  Orthostatic BP Patient Position BP Location Cuff Size Orthostatic Pulse  08/08/20 0852 129/68 Standing Left Arm Normal 85  08/08/20 0851 128/66 Sitting Left Arm Normal 77  08/08/20 0843 139/76 Supine Left Arm Normal 76     Body mass index is 23.75 kg/m. Filed Weights   08/08/20 0843  Weight: 185 lb (83.9 kg)     Objective:   Physical Exam Vitals and nursing note reviewed.  Constitutional:      General: He is not in acute distress. Neck:     Vascular: No JVD.  Cardiovascular:     Rate and Rhythm: Normal rate and regular rhythm.     Pulses:           Femoral pulses are 2+ on the right side and 2+ on the left side.      Popliteal pulses are 2+ on the right side and 1+ on the left side.       Dorsalis pedis pulses are 2+ on the right side and 0 on the left side.       Posterior tibial pulses are 0 on the right side and 0 on the left side.     Heart sounds: Normal heart sounds. No murmur heard. Pulmonary:     Effort: Pulmonary effort  is normal.     Breath sounds: Normal breath sounds. No wheezing or rales.  Abdominal:     General: There is no distension.  Musculoskeletal:     Right lower leg: No edema.     Left lower leg: No edema.  Skin:    Comments: 2 mm black eschar on left foot second toe  Neurological:     General: No focal deficit present.     Mental Status: He is alert and oriented to person, place, and time.        Assessment & Recommendations:   80 y.o. Caucasian male with uncontrolled type 2 diabetes mellitus, peripheral artery disease, spinal stenosis s/p prior surgeries, stable distal aortic arch ulcer, syncope, severe CAD with stable angina  Plan for LAD intervention    Nigel Mormon, MD Pager: [phone] Office: [phone]

## 2020-10-02 ENCOUNTER — Encounter (HOSPITAL_COMMUNITY): Payer: Self-pay | Admitting: Cardiology

## 2020-10-03 ENCOUNTER — Other Ambulatory Visit: Payer: Self-pay

## 2020-10-03 ENCOUNTER — Ambulatory Visit: Payer: BC Managed Care – PPO | Admitting: Cardiology

## 2020-10-03 ENCOUNTER — Encounter: Payer: Self-pay | Admitting: Cardiology

## 2020-10-03 VITALS — BP 121/61 | HR 80 | Temp 98.6°F | Resp 16 | Ht 74.0 in | Wt 182.0 lb

## 2020-10-03 DIAGNOSIS — R55 Syncope and collapse: Secondary | ICD-10-CM

## 2020-10-03 DIAGNOSIS — I739 Peripheral vascular disease, unspecified: Secondary | ICD-10-CM

## 2020-10-03 DIAGNOSIS — I25118 Atherosclerotic heart disease of native coronary artery with other forms of angina pectoris: Secondary | ICD-10-CM

## 2020-10-03 NOTE — Progress Notes (Signed)
Patient referred by [name], MD for syncope  Subjective:   Harry Zamora, male    DOB: Apr 30, 1940, 80 y.o.   MRN: [id number]   Chief Complaint  Patient presents with   Loss of Consciousness   PAD   Coronary Artery Disease   Follow-up    Post cath     HPI  80 y.o. Caucasian male with uncontrolled type 2 diabetes mellitus, CAD, PAD, syncope, spinal stenosis s/p prior surgeries  Patient is doing well since PCI.  He denies any chest pain or shortness of breath symptoms.  He has not had any recurrent syncope.  He is compliant with his medical therapy.  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  He is very active, walks for 30 minutes most days, and works in the yard regularly.  At baseline, he does not have any symptoms of chest pain, shortness of breath, claudication with exertion.  He reports occasional episodes of shortness of breath just before going to bed.  He denies any orthopnea, PND, leg edema symptoms.  Patient with had an episode of syncope about 2 weeks ago.  He woke up at 6 AM, as usual, had his coffee.  He had not had his breakfast yet.  He was sitting at dining table.  He does not recollect having any chest pain, shortness of breath, lightheadedness symptoms.  Next thing he remembers that he was down on the floor with his wife standing next to him.  His wife called his nephew, who assisted him to sit up.  The entire episode lasted for less than a minute.  On waking up, patient did not have any confusion, loss of bowel or bladder tone, tongue bite, seizures.  Patient did not have any other symptoms rest of the day.  He has not had any recurrence of similar episodes since then.  On a separate note, patient tells me that he has had spinal surgeries in the past, and was going to undergo surgery for removal of the rods today.  However, surgery was postponed given his syncope episode, as well as uncontrolled diabetes with A1c  of 9.8%.  Patient denies any claudication symptoms.  However, he has noted an area on his second toe left foot.  This started as a area of redness which he reportedly tried to pick at.  Now the area is black and discolored.  He denies any pain at the site.   Current Outpatient Medications on File Prior to Visit  Medication Sig Dispense Refill   aspirin EC 81 MG tablet Take 1 tablet (81 mg total) by mouth daily. 90 tablet 3   clopidogrel (PLAVIX) 75 MG tablet Take 1 tablet (75 mg total) by mouth daily. 90 tablet 3   Dulaglutide (TRULICITY) 1.5 UU/8.2CM SOPN Inject 1.5 mg into the skin every Wednesday.     FARXIGA 10 MG TABS tablet Take 10 mg by mouth daily.      gabapentin (NEURONTIN) 600 MG tablet Take 600 mg by mouth 2 (two) times daily.     lovastatin (MEVACOR) 40 MG tablet Take 40 mg by mouth at bedtime.      metFORMIN (GLUCOPHAGE) 500 MG tablet Take 2 tablets (1,000 mg total) by mouth 2 (two) times daily with a meal. Resume 10/03/2020  1   nitroGLYCERIN (NITROSTAT) 0.4 MG SL tablet Place 1 tablet (0.4 mg total) under the tongue every 5 (five) minutes as needed for chest pain. 30 tablet 3  pregabalin (LYRICA) 75 MG capsule Take 75 mg by mouth 3 (three) times daily.     rOPINIRole (REQUIP) 4 MG tablet Take 4 mg by mouth at bedtime.     traMADol (ULTRAM) 50 MG tablet Take by mouth as needed.     vitamin B-12 (CYANOCOBALAMIN) 500 MCG tablet Take 500 mcg by mouth daily.     metoprolol succinate (TOPROL-XL) 25 MG 24 hr tablet TAKE 1 TABLET(25 MG) BY MOUTH DAILY 90 tablet 0   No current facility-administered medications on file prior to visit.    Cardiovascular and other pertinent studies:  EKG 10/03/2020: Sinus rhythm 73 bpm Old anteroseptal infarct Nonspecific T-abnormality    Coronary intervention 10/01/2020: Successful percutaneous coronary intervention LAD and ramus with simultaneous T-stenting        PTCA and stent placement 3.0 X 38 mm Onyx Frontier drug-eluting stent Ostial-mid  LAD        PTCA and stent placement 3.0 X 26 mm Resolute Onyx drug-eluting stent        Final kissing balloon angioplasty with 3.5X15 mm [place] balloon in LAD & 2.75X15 mm [place] balloon in ramus   LAD stenosis 90%-->0% Ramus stenosis 80%-->0% TIMI flow III-->III   EKG 08/08/2020: Sinus rhythm 76 bpm Cannot exclude old anteroseptal infarct  Coronary angiography 09/17/2020: LM: Normal LAD: Long ostial to mid LAD 90% stenosis        Small high diag 1 patent        Subtotally occluded diag 2, collaterals from OM Ramus: Medium sized vessel with mid 70% stenosis LCx: Large,dominant. Normal RCA: Small, non-dominant. Prox 60% stenosis   LVEDP normal   Obstructive CAD could possible be a cause of his syncope, although this is only speculative. He feels tired after walking for 2 miles, although denies any specific angina symptoms.  Based on his coronary angiogram findings, perioperative cardiac risk is elevated. Will discuss with Dr. Erline Levine re: urgency and nature of his upcoming spine surgery and then discuss further recommendations re: CAD  Mobile cardiac telemetry 11 days 08/28/2020 - 09/09/2020: Dominant rhythm: Sinus. HR 57-123 bpm. Avg HR 78 bpm, while in sinus rhythm. 17 episodes of SVT, fastest at 207 bpm for 13 beats, longest for 20 beats at 140 bpm. <1% isolated SVE, couplet/triplets. 1 episode of VT, at 162 bpm for 5 beats 2.2% isolated VE, <1% couplet No atrial fibrillation/atrial flutter/high grade AV block, sinus pause >3sec noted. 5 patient triggered events, all correlated with sinus rhythm  Exercise Tetrofosmin stress test 08/28/2020: Exercise nuclear stress test was performed using Bruce protocol. Patient reached 4.6 METS, and 93% of age predicted maximum heart rate. Exercise capacity was low. No chest pain reported. Heart rate and hemodynamic response were normal. Stress EKG showed sinus tachycardia, old anteroseptal infarct, no significant ST-T changes. SPECT images show small  sized, severe intensity, reversible perfusion defect in apical anterior myocardium, with associated mild hypokinesis. Stress LVEF 47%. High risk study.  Echocardiogram 08/28/2020:    Mildly depressed LV systolic function with EF 50%. Left ventricle cavity  is normal in size. Mild concentric hypertrophy of the left ventricle.  Normal global wall motion. Doppler evidence of grade I (impaired)  diastolic dysfunction, elevated LAP. Calculated EF 50%.  Left atrial cavity is mildly dilated by volume index.  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  Lower Extremity Arterial Duplex 08/28/2020:  No hemodynamically significant stenoses are identified in the right or the  left lower extremity arterial system.  There is monophasic  waveform in the bilateral AT and PT vessels and left  PT has spectral broadening suggestive of severe diffuse disease.  This exam reveals moderately decreased perfusion of the right lower  extremity, noted at the anterior tibial artery level (ABI 0.73) and  moderately decreased perfusion of the left lower extremity, noted at the  post tibial artery level (ABI 0.73).   CT lumbar spine 06/26/2020: 1. Prior L3-S1 PLIF with fracture of the left S1 anterior screw and loosening of the right S1 anterior and posterior screws. The right posterior screw traverses the right L5-S1 facet joint. No significant interbody fusion at any level. 2. Residual moderate right neuroforaminal stenosis at L5-S1. Asymmetric underfilling of the right S1 nerve root with suspected scar tissue surrounding the nerve root in the lateral recess 3. Progressive adjacent segment disease at L2-L3 with new moderate spinal canal and right neuroforaminal stenosis. 4. Nonobstructive right nephrolithiasis. 5. Aortic Atherosclerosis (ICD10-I70.0).     ABI 08/30/2019: Right: Velocities suggest 50-74% stenosis in the proximal PTA and 30-49%  stenosis in the proximal Peroneal a. Tibial vessel disease.  Left:  No obvious evidence of stenosis. Tibial vessel disease.    Recent labs: 10/01/2020: Glucose 169, BUN/Cr 31/1.38. EGFR 51. Na/K 137/5.8.  H/H 12/38. MCV 97. Platelets 234  08/05/2020: Glucose 232, BUN/Cr 28/1.26. EGFR 58. Na/K 137/4.4.  H/H 13/42. MCV 96. Platelets 206 HbA1C 9.5% Lipid panel N/A TSH N/A  09/20/2019: Chol 123, TG 183, HDL 32, LDL 54   Review of Systems  Cardiovascular:  Positive for syncope. Negative for chest pain, dyspnea on exertion, leg swelling and palpitations.        Vitals:   10/03/20 1102  BP: 121/61  Pulse: 80  Resp: 16  Temp: 98.6 F (37 C)  SpO2: 96%    Body mass index is 23.37 kg/m. Filed Weights   10/03/20 1102  Weight: 182 lb (82.6 kg)     Objective:   Physical Exam Vitals and nursing note reviewed.  Constitutional:      General: He is not in acute distress. Neck:     Vascular: No JVD.  Cardiovascular:     Rate and Rhythm: Normal rate and regular rhythm.     Pulses:          Femoral pulses are 2+ on the right side and 2+ on the left side.      Popliteal pulses are 2+ on the right side and 1+ on the left side.       Dorsalis pedis pulses are 2+ on the right side and 0 on the left side.       Posterior tibial pulses are 0 on the right side and 0 on the left side.     Heart sounds: Normal heart sounds. No murmur heard. Pulmonary:     Effort: Pulmonary effort is normal.     Breath sounds: Normal breath sounds. No wheezing or rales.  Musculoskeletal:     Right lower leg: No edema.     Left lower leg: No edema.        Assessment & Recommendations:   80 y.o. Caucasian male with uncontrolled type 2 diabetes mellitus, CAD, PAD, syncope, spinal stenosis s/p prior surgeries  CAD:  Obstructive CAD with high risk anatomy, stable angina Successfully treated with LAD/ramus T stenting (10/01/2020) Recommend DAPT with Aspirin and plavix at least till 03/2021 Continue metoprolol succinate 25 mg daily. Continue lovastatin 40 mg daily  for now.  We will check lipid panel.  Syncope: No  arrhthymias on 2 week cardiac telemetry. Potentially could have been ventricular arrhythmia related to obstructive CAD, although no objective evidence for this If recurrence, could consider loop recorder placement  PAD: B/l ABI 0.73. No critical limb ischemia or lifestyle limiting claudication Previously had a focal eschar on left foot 2nd toe, which could have been an atheroembolic event. Likely source could be distal aortic arahc aulcer. Now completely healed. Continue medical management and regular walking  Distal aortic arch ulcer: Repeat CT in 6 months as per CVTS  Hyperkalemia: Recurrent hyperkalemia without any iatrogenic cause.   Recheck BMP.  May need follow-up with PCP Dr. Forde Dandy.     Nigel Mormon, MD Pager: [phone] Office: [phone]

## 2020-10-04 ENCOUNTER — Other Ambulatory Visit: Payer: Self-pay | Admitting: Cardiology

## 2020-10-04 DIAGNOSIS — I25118 Atherosclerotic heart disease of native coronary artery with other forms of angina pectoris: Secondary | ICD-10-CM

## 2020-10-04 LAB — LIPID PANEL
Chol/HDL Ratio: 4.2 ratio (ref 0.0–5.0)
Cholesterol, Total: 137 mg/dL (ref 100–199)
HDL: 33 mg/dL — ABNORMAL LOW (ref >39)
LDL Chol Calc (NIH): 82 mg/dL (ref 0–99)
Triglycerides: 121 mg/dL (ref 0–149)
VLDL Cholesterol Cal: 22 mg/dL (ref 5–40)

## 2020-10-04 LAB — BASIC METABOLIC PANEL
BUN/Creatinine Ratio: 20 (ref 10–24)
BUN: 27 mg/dL (ref 8–27)
CO2: 23 mmol/L (ref 20–29)
Calcium: 9.3 mg/dL (ref 8.6–10.2)
Chloride: 102 mmol/L (ref 96–106)
Creatinine, Ser: 1.38 mg/dL — ABNORMAL HIGH (ref 0.76–1.27)
Glucose: 206 mg/dL — ABNORMAL HIGH (ref 65–99)
Potassium: 5.8 mmol/L — ABNORMAL HIGH (ref 3.5–5.2)
Sodium: 141 mmol/L (ref 134–144)
eGFR: 51 mL/min/{1.73_m2} — ABNORMAL LOW (ref >59)

## 2020-10-04 MED ORDER — ROSUVASTATIN CALCIUM 40 MG PO TABS: 40.0000 mg | ORAL_TABLET | Freq: Every day | ORAL | 3 refills | Status: DC

## 2020-10-04 NOTE — Progress Notes (Signed)
Called patient, NA, LMAM

## 2020-10-04 NOTE — Progress Notes (Signed)
Please call the patient with the following:  Potassium remains elevated at 5.8. Avoid potassium rich foods like banana, potato. Take Lokelma samples given to you, one sachet daily. Please contact Dr. Forde Dandy to follow up on this.   LDL (bad cholesterol) is 82. Recommend switching lovastatin 40 mg to Crestor 40 mg Repeat lipid panel in October 2022.   Thanks MJP

## 2020-10-07 NOTE — Progress Notes (Signed)
Attempted to call pt, no answer. Left vm requesting call back.

## 2020-10-08 ENCOUNTER — Telehealth: Payer: Self-pay | Admitting: Cardiology

## 2020-10-08 NOTE — Progress Notes (Signed)
Called and spoke to pt, pt voiced understanding and agreed to switch to Crestor. Would you like me to update the [facility] and send that in? How many times daily?

## 2020-10-08 NOTE — Progress Notes (Signed)
I did.   Thanks MJP

## 2020-10-14 ENCOUNTER — Telehealth (HOSPITAL_COMMUNITY): Payer: Self-pay

## 2020-10-14 NOTE — Telephone Encounter (Signed)
Pt is not interested in the cardiac rehab program. Closed referral 

## 2020-10-17 ENCOUNTER — Ambulatory Visit: Payer: BC Managed Care – PPO | Admitting: Cardiology

## 2020-10-24 NOTE — Telephone Encounter (Signed)
error 

## 2020-11-08 ENCOUNTER — Other Ambulatory Visit: Payer: Self-pay

## 2020-11-08 DIAGNOSIS — I739 Peripheral vascular disease, unspecified: Secondary | ICD-10-CM

## 2020-11-19 ENCOUNTER — Ambulatory Visit (HOSPITAL_COMMUNITY)
Admission: RE | Admit: 2020-11-19 | Discharge: 2020-11-19 | Disposition: A | Discharge status: Home / Self Care | Payer: Medicare Other | Source: Ambulatory Visit | Attending: Orthopedic Surgery | Admitting: Orthopedic Surgery

## 2020-11-19 ENCOUNTER — Ambulatory Visit (INDEPENDENT_AMBULATORY_CARE_PROVIDER_SITE_OTHER): Payer: Medicare Other | Admitting: Physician Assistant

## 2020-11-19 ENCOUNTER — Other Ambulatory Visit: Payer: Self-pay

## 2020-11-19 VITALS — BP 125/60 | HR 69 | Temp 98.5°F | Resp 14 | Ht 74.0 in | Wt 176.1 lb

## 2020-11-19 DIAGNOSIS — M79605 Pain in left leg: Secondary | ICD-10-CM

## 2020-11-19 DIAGNOSIS — M79604 Pain in right leg: Secondary | ICD-10-CM | POA: Diagnosis not present

## 2020-11-19 DIAGNOSIS — I739 Peripheral vascular disease, unspecified: Secondary | ICD-10-CM | POA: Diagnosis present

## 2020-11-19 MED ORDER — CILOSTAZOL 100 MG PO TABS: 100.0000 mg | ORAL_TABLET | Freq: Two times a day (BID) | ORAL | 2 refills | Status: DC

## 2020-11-19 NOTE — Progress Notes (Signed)
Peripheral Arterial Disease Follow-Up   VASCULAR SURGERY ASSESSMENT & PLAN:   Harry Zamora is a 80 y.o. male who presents with bilateral foot pain. Referred by PCP Dr. [name].  Peripheral arterial disease.  ABIs are not reliable.  Bilateral tibial disease by arterial duplex in June of 2021. Patient has worsening bilateral foot pain. This does not sound like classic claudication nor neuropathic pain. I will prescribe a trial of Pletal and arrange follow-up with Dr. Trula Slade. Continue optimal medical management of diabetes, hypertension and follow-up with primary care physician. Encouraged complete smoking cessation. Continue the following medications: Plavix, aspirin, statin Follow-up in 4-6 weeks with Dr. Trula Slade  SUBJECTIVE:   The patient describes bilateral foot pain on most days as burning not relieved with gabapentin. Pain is worse with walking and limiting how far he can go. Worsening over the last 3 months. Denies rest pain or non-healing wounds.  PHYSICAL EXAM:   Vitals:   11/19/20 1425  BP: 125/60  Pulse: 69  Resp: 14  Temp: 98.5 F (36.9 C)  TempSrc: Temporal  SpO2: 98%  Weight: 176 lb 1.6 oz (79.9 kg)  Height: '6\' 2"'$  (1.88 m)    General appearance: Well-developed, well-nourished in no apparent distress Neurologic: Alert and oriented x 4. Cardiovascular: Heart rate and rhythm are regular.   Abdomen: No palpable pulsatile mass. Extremities: Skin intact.  Both feet are warm and well perfused.  Motor function and sensation intact Pulse exam: 2+ femoral, popliteal pulses bilaterally. Pedal pulses not palpable.   NON-INVASIVE VASCULAR STUDIES  ABIs 11/19/2020  ABI/TBIToday's ABIToday's TBIPrevious ABIPrevious TBI  +-------+-----------+-----------+------------+------------+  Right  1.33       0.41       0.82        0.53          +-------+-----------+-----------+------------+------------+  Left   0.83       0.08       0.64        0.26           +-------+-----------+-----------+------------+------------+  Right toe pressure 53. Monophasic waveforms. Left toe pressure 10. Monophasic waveforms.    Arterial wall calcification precludes accurate ankle pressures and ABIs.  Bilateral ABIs appear increased compared to prior study on 08/16/2019.     Summary:  Right: Resting right ankle-brachial index indicates noncompressible right  lower extremity arteries. The right toe-brachial index is abnormal.   Left: Resting left ankle-brachial index indicates mild left lower  extremity arterial disease. The left toe-brachial index is abnormal.     *See table(s) above for measurements and observations.       Preliminary    ABI's/TBI's on 08/16/19: Right:  0.82 - Great toe pressure: 84 Left:  0.64- Great toe pressure: 38     Previous ABI's/TBI's on 10/19/13: Right: 1.2 Left:  1.39    PROBLEM LIST:    The patient's past medical history, past surgical history, family history, social history, allergy list and medication list are reviewed. Diabetic. Recent coronary stent placement on Plavix. Recent echo with estimated EF of 50%. No history of peptic ulcer disease.   CURRENT MEDS:    Current Outpatient Medications:    aspirin EC 81 MG tablet, Take 1 tablet (81 mg total) by mouth daily., Disp: 90 tablet, Rfl: 3   clopidogrel (PLAVIX) 75 MG tablet, Take 1 tablet (75 mg total) by mouth daily., Disp: 90 tablet, Rfl: 3   Dulaglutide (TRULICITY) 1.5 0000000 SOPN, Inject 1.5 mg into the skin every Wednesday., Disp: ,  Rfl:    FARXIGA 10 MG TABS tablet, Take 10 mg by mouth daily. , Disp: , Rfl:    gabapentin (NEURONTIN) 600 MG tablet, Take 600 mg by mouth 2 (two) times daily., Disp: , Rfl:    metFORMIN (GLUCOPHAGE) 500 MG tablet, Take 2 tablets (1,000 mg total) by mouth 2 (two) times daily with a meal. Resume 10/03/2020, Disp: , Rfl: 1   metoprolol succinate (TOPROL-XL) 25 MG 24 hr tablet, TAKE 1 TABLET(25 MG) BY MOUTH DAILY, Disp: 90 tablet, Rfl:  0   nitroGLYCERIN (NITROSTAT) 0.4 MG SL tablet, Place 1 tablet (0.4 mg total) under the tongue every 5 (five) minutes as needed for chest pain., Disp: 30 tablet, Rfl: 3   rOPINIRole (REQUIP) 4 MG tablet, Take 4 mg by mouth at bedtime., Disp: , Rfl:    rosuvastatin (CRESTOR) 40 MG tablet, Take 1 tablet (40 mg total) by mouth daily., Disp: 30 tablet, Rfl: 3   vitamin B-12 (CYANOCOBALAMIN) 500 MCG tablet, Take 500 mcg by mouth daily., Disp: , Rfl:    pregabalin (LYRICA) 75 MG capsule, Take 75 mg by mouth 3 (three) times daily. (Patient not taking: Reported on 11/19/2020), Disp: , Rfl:    traMADol (ULTRAM) 50 MG tablet, Take by mouth as needed. (Patient not taking: Reported on 11/19/2020), Disp: , Rfl:    REVIEW OF SYSTEMS:   '[X]'$  denotes positive finding, '[ ]'$  denotes negative finding Cardiac  Comments:  Chest pain or chest pressure:    Shortness of breath upon exertion:    Short of breath when lying flat:    Irregular heart rhythm:        Vascular    Pain in calf, thigh, or hip brought on by ambulation:    Pain in feet at night that wakes you up from your sleep:  x  See HPI  Blood clot in your veins:    Leg swelling:         Pulmonary    Oxygen at home:    Productive cough:     Wheezing:         Neurologic    Sudden weakness in arms or legs:     Sudden numbness in arms or legs:     Sudden onset of difficulty speaking or slurred speech:    Temporary loss of vision in one eye:     Problems with dizziness:         Gastrointestinal    Blood in stool:     Vomited blood:         Genitourinary    Burning when urinating:     Blood in urine:        Psychiatric    Major depression:         Hematologic    Bleeding problems:    Problems with blood clotting too easily:        Skin    Rashes or ulcers:        Constitutional    Fever or chills:     Barbie Banner, PA-C  Office: [phone] 11/19/2020 Dr. Stanford Breed

## 2020-12-11 ENCOUNTER — Other Ambulatory Visit (HOSPITAL_COMMUNITY): Payer: Self-pay | Admitting: Adult Health

## 2020-12-11 ENCOUNTER — Ambulatory Visit (HOSPITAL_COMMUNITY)
Admission: RE | Admit: 2020-12-11 | Discharge: 2020-12-11 | Disposition: A | Discharge status: Home / Self Care | Payer: Medicare Other | Source: Ambulatory Visit | Attending: Endocrinology | Admitting: Endocrinology

## 2020-12-11 ENCOUNTER — Other Ambulatory Visit: Payer: Self-pay

## 2020-12-11 DIAGNOSIS — R55 Syncope and collapse: Secondary | ICD-10-CM | POA: Insufficient documentation

## 2020-12-13 ENCOUNTER — Encounter: Payer: Self-pay | Admitting: Cardiology

## 2020-12-13 ENCOUNTER — Other Ambulatory Visit: Payer: Self-pay

## 2020-12-13 ENCOUNTER — Ambulatory Visit: Payer: BC Managed Care – PPO | Admitting: Cardiology

## 2020-12-13 VITALS — BP 136/72 | HR 82 | Temp 98.0°F | Resp 17 | Ht 74.0 in | Wt 179.2 lb

## 2020-12-13 DIAGNOSIS — R55 Syncope and collapse: Secondary | ICD-10-CM

## 2020-12-13 DIAGNOSIS — I25118 Atherosclerotic heart disease of native coronary artery with other forms of angina pectoris: Secondary | ICD-10-CM

## 2020-12-13 DIAGNOSIS — I739 Peripheral vascular disease, unspecified: Secondary | ICD-10-CM

## 2020-12-13 NOTE — Progress Notes (Signed)
Patient referred by [name], MD for syncope  Subjective:   Jeannetta Nap, male    DOB: Sep 11, 1940, 80 y.o.   MRN: [id number]   Chief Complaint  Patient presents with   Coronary Artery Disease   PAD   Follow-up    3 months     HPI  80 y.o. Caucasian male with uncontrolled type 2 diabetes mellitus, CAD, PAD, syncope, spinal stenosis s/p prior surgeries  Patient recently had another syncope episode.  Few days ago, patient was at home.  He had chest stood up to walk to the TV around 1:30 PM.  This is then, he suddenly felt lightheaded for about 10 seconds before losing consciousness and falling down.  Patient's friend was nearby who helped him stand up.  He regained consciousness in about 10-15 seconds.  He does not recollect having had any chest pain, shortness of breath symptoms prior to his syncope.  He did not lose bowel or bladder tone.  After regaining consciousness he did not have any recurrent symptoms.  On a separate note, patient continues to have back pain and he is eager to undergo surgery soon.  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  He is very active, walks for 30 minutes most days, and works in the yard regularly.  At baseline, he does not have any symptoms of chest pain, shortness of breath, claudication with exertion.  He reports occasional episodes of shortness of breath just before going to bed.  He denies any orthopnea, PND, leg edema symptoms.  Patient with had an episode of syncope about 2 weeks ago.  He woke up at 6 AM, as usual, had his coffee.  He had not had his breakfast yet.  He was sitting at dining table.  He does not recollect having any chest pain, shortness of breath, lightheadedness symptoms.  Next thing he remembers that he was down on the floor with his wife standing next to him.  His wife called his nephew, who assisted him to sit up.  The entire episode lasted for less than a minute.  On  waking up, patient did not have any confusion, loss of bowel or bladder tone, tongue bite, seizures.  Patient did not have any other symptoms rest of the day.  He has not had any recurrence of similar episodes since then.  On a separate note, patient tells me that he has had spinal surgeries in the past, and was going to undergo surgery for removal of the rods today.  However, surgery was postponed given his syncope episode, as well as uncontrolled diabetes with A1c of 9.8%.  Patient denies any claudication symptoms.  However, he has noted an area on his second toe left foot.  This started as a area of redness which he reportedly tried to pick at.  Now the area is black and discolored.  He denies any pain at the site.   Current Outpatient Medications on File Prior to Visit  Medication Sig Dispense Refill   aspirin EC 81 MG tablet Take 1 tablet (81 mg total) by mouth daily. 90 tablet 3   cilostazol (PLETAL) 100 MG tablet Take 1 tablet (100 mg total) by mouth 2 (two) times daily before a meal. 60 tablet 2   clopidogrel (PLAVIX) 75 MG tablet Take 1 tablet (75 mg total) by mouth daily. 90 tablet 3   Dulaglutide (TRULICITY) 1.5 QB/3.4LP SOPN Inject 1.5 mg into the skin every Wednesday.  FARXIGA 10 MG TABS tablet Take 10 mg by mouth daily.      gabapentin (NEURONTIN) 600 MG tablet Take 600 mg by mouth 2 (two) times daily.     metFORMIN (GLUCOPHAGE) 500 MG tablet Take 2 tablets (1,000 mg total) by mouth 2 (two) times daily with a meal. Resume 10/03/2020  1   metoprolol succinate (TOPROL-XL) 25 MG 24 hr tablet TAKE 1 TABLET(25 MG) BY MOUTH DAILY 90 tablet 0   nitroGLYCERIN (NITROSTAT) 0.4 MG SL tablet Place 1 tablet (0.4 mg total) under the tongue every 5 (five) minutes as needed for chest pain. 30 tablet 3   pregabalin (LYRICA) 75 MG capsule Take 75 mg by mouth 3 (three) times daily. (Patient not taking: Reported on 11/19/2020)     rOPINIRole (REQUIP) 4 MG tablet Take 4 mg by mouth at bedtime.      rosuvastatin (CRESTOR) 40 MG tablet Take 1 tablet (40 mg total) by mouth daily. 30 tablet 3   traMADol (ULTRAM) 50 MG tablet Take by mouth as needed. (Patient not taking: Reported on 11/19/2020)     vitamin B-12 (CYANOCOBALAMIN) 500 MCG tablet Take 500 mcg by mouth daily.     No current facility-administered medications on file prior to visit.    Cardiovascular and other pertinent studies:  EKG 12/13/2020: Sinus rhythm 90 bpm with brief ventricular bigeminy Old anteroseptal infarct  Carotid duplex 12/11/2020: Right Carotid: Velocities in the right ICA are consistent with a 1-39%  stenosis.  Left Carotid: Velocities in the left ICA are consistent with a 1-39%  stenosis.  Vertebrals:  Bilateral vertebral arteries demonstrate antegrade flow.  Subclavians: Normal flow hemodynamics were seen in bilateral subclavian               arteries.   EKG 10/03/2020: Sinus rhythm 73 bpm Old anteroseptal infarct Nonspecific T-abnormality    Coronary intervention 10/01/2020: Successful percutaneous coronary intervention LAD and ramus with simultaneous T-stenting        PTCA and stent placement 3.0 X 38 mm Onyx Frontier drug-eluting stent Ostial-mid LAD        PTCA and stent placement 3.0 X 26 mm Resolute Onyx drug-eluting stent        Final kissing balloon angioplasty with 3.5X15 mm Gillespie balloon in LAD & 2.75X15 mm  balloon in ramus   LAD stenosis 90%-->0% Ramus stenosis 80%-->0% TIMI flow III-->III   EKG 08/08/2020: Sinus rhythm 76 bpm Cannot exclude old anteroseptal infarct  Coronary angiography 09/17/2020: LM: Normal LAD: Long ostial to mid LAD 90% stenosis        Small high diag 1 patent        Subtotally occluded diag 2, collaterals from OM Ramus: Medium sized vessel with mid 70% stenosis LCx: Large,dominant. Normal RCA: Small, non-dominant. Prox 60% stenosis   LVEDP normal   Obstructive CAD could possible be a cause of his syncope, although this is only speculative. He feels tired  after walking for 2 miles, although denies any specific angina symptoms.  Based on his coronary angiogram findings, perioperative cardiac risk is elevated. Will discuss with Dr. Erline Levine re: urgency and nature of his upcoming spine surgery and then discuss further recommendations re: CAD  Mobile cardiac telemetry 11 days 08/28/2020 - 09/09/2020: Dominant rhythm: Sinus. HR 57-123 bpm. Avg HR 78 bpm, while in sinus rhythm. 17 episodes of SVT, fastest at 207 bpm for 13 beats, longest for 20 beats at 140 bpm. <1% isolated SVE, couplet/triplets. 1 episode of VT, at 162 bpm  for 5 beats 2.2% isolated VE, <1% couplet No atrial fibrillation/atrial flutter/high grade AV block, sinus pause >3sec noted. 5 patient triggered events, all correlated with sinus rhythm  Exercise Tetrofosmin stress test 08/28/2020: Exercise nuclear stress test was performed using Bruce protocol. Patient reached 4.6 METS, and 93% of age predicted maximum heart rate. Exercise capacity was low. No chest pain reported. Heart rate and hemodynamic response were normal. Stress EKG showed sinus tachycardia, old anteroseptal infarct, no significant ST-T changes. SPECT images show small sized, severe intensity, reversible perfusion defect in apical anterior myocardium, with associated mild hypokinesis. Stress LVEF 47%. High risk study.  Echocardiogram 08/28/2020:    Mildly depressed LV systolic function with EF 50%. Left ventricle cavity  is normal in size. Mild concentric hypertrophy of the left ventricle.  Normal global wall motion. Doppler evidence of grade I (impaired)  diastolic dysfunction, elevated LAP. Calculated EF 50%.  Left atrial cavity is mildly dilated by volume index.  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  Lower Extremity Arterial Duplex 08/28/2020:  No hemodynamically significant stenoses are identified in the right or the  left lower extremity arterial system.  There is monophasic waveform in the bilateral AT  and PT vessels and left  PT has spectral broadening suggestive of severe diffuse disease.  This exam reveals moderately decreased perfusion of the right lower  extremity, noted at the anterior tibial artery level (ABI 0.73) and  moderately decreased perfusion of the left lower extremity, noted at the  post tibial artery level (ABI 0.73).   CT lumbar spine 06/26/2020: 1. Prior L3-S1 PLIF with fracture of the left S1 anterior screw and loosening of the right S1 anterior and posterior screws. The right posterior screw traverses the right L5-S1 facet joint. No significant interbody fusion at any level. 2. Residual moderate right neuroforaminal stenosis at L5-S1. Asymmetric underfilling of the right S1 nerve root with suspected scar tissue surrounding the nerve root in the lateral recess 3. Progressive adjacent segment disease at L2-L3 with new moderate spinal canal and right neuroforaminal stenosis. 4. Nonobstructive right nephrolithiasis. 5. Aortic Atherosclerosis (ICD10-I70.0).     ABI 08/30/2019: Right: Velocities suggest 50-74% stenosis in the proximal PTA and 30-49%  stenosis in the proximal Peroneal a. Tibial vessel disease.  Left: No obvious evidence of stenosis. Tibial vessel disease.    Recent labs: 10/03/2020: Glucose 206, BUN/Cr 27/1.38. EGFR 51. Na/K 141/5.8.  Chol 137, TG 121, HDL 33, LDL 82  10/01/2020: Glucose 169, BUN/Cr 31/1.38. EGFR 51. Na/K 137/5.8.  H/H 12/38. MCV 97. Platelets 234  08/05/2020: Glucose 232, BUN/Cr 28/1.26. EGFR 58. Na/K 137/4.4.  H/H 13/42. MCV 96. Platelets 206 HbA1C 9.5% Lipid panel N/A TSH N/A  09/20/2019: Chol 123, TG 183, HDL 32, LDL 54   Review of Systems  Cardiovascular:  Positive for syncope. Negative for chest pain, dyspnea on exertion, leg swelling and palpitations.        Vitals:   12/13/20 1332 12/13/20 1333  BP:    Pulse:    Resp:    Temp:    SpO2: 98% 97%    Body mass index is 23.01 kg/m. Filed Weights    12/13/20 1314  Weight: 179 lb 3.2 oz (81.3 kg)   Orthostatic VS for the past 72 hrs (Last 3 readings):  Orthostatic BP Patient Position BP Location Cuff Size Orthostatic Pulse  12/13/20 1333 120/52 Standing Left Arm Normal (!) 45  12/13/20 1332 137/72 Sitting Left Arm Normal 87  12/13/20 1330 143/74 Supine  Left Arm Normal 82     Objective:   Physical Exam Vitals and nursing note reviewed.  Constitutional:      General: He is not in acute distress. Neck:     Vascular: No JVD.  Cardiovascular:     Rate and Rhythm: Normal rate and regular rhythm.     Pulses:          Femoral pulses are 2+ on the right side and 2+ on the left side.      Popliteal pulses are 2+ on the right side and 1+ on the left side.       Dorsalis pedis pulses are 2+ on the right side and 0 on the left side.       Posterior tibial pulses are 0 on the right side and 0 on the left side.     Heart sounds: Normal heart sounds. No murmur heard. Pulmonary:     Effort: Pulmonary effort is normal.     Breath sounds: Normal breath sounds. No wheezing or rales.  Musculoskeletal:     Right lower leg: No edema.     Left lower leg: No edema.        Assessment & Recommendations:   80 y.o. Caucasian male with uncontrolled type 2 diabetes mellitus, CAD, PAD, recurrent syncope, spinal stenosis s/p prior surgeries  Recurrent syncope: Orthostatics positive in the office today.  EKG shows brief ventricular bigeminy.  I do suspect his syncope was orthostatic in nature.  Recommend increasing hydration and wearing compression stockings.  Given that he has had PVCs on EKG today, I will start with external monitor for 2 weeks to assess PVC burden.  I will see him back in 3 weeks.  If orthostatics have resolved and still has syncope episodes, will then consider loop recorder placement.  Currently, he is on dual antiplatelet therapy.  Aspirin is possible, I would like to continue uninterrupted dual antiplatelet therapy given his complex  stenting in 09/2020.  This does increase his bleeding risk with any procedure, including loop recorder placement.  Given that we have an alternate explanation for his syncope, I will hold off loop recorder placement as of today.  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 Continue rosuvastatin 40 mg daily. Check lipid panel.  Cardiac risk stratification: Ideally, I would like him to be on uninterrupted dual antiplatelet therapy for 6 months starting 09/2020.  However, if his back pain is becoming lifestyle limiting, could potentially consider interruption of dual antiplatelet therapy for back surgery after November 2022.  PAD: B/l ABI 0.73 (08/2020) No critical limb ischemia or lifestyle limiting claudication Previously had a focal eschar on left foot 2nd toe, which could have been an atheroembolic event. Likely source could be distal aortic arahc aulcer. Now completely healed. 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 Check BMP.   Nigel Mormon, MD Pager: [phone] Office: [phone]

## 2020-12-16 ENCOUNTER — Encounter: Payer: Self-pay | Admitting: Surgery

## 2020-12-16 ENCOUNTER — Other Ambulatory Visit: Payer: Self-pay

## 2020-12-16 ENCOUNTER — Ambulatory Visit (INDEPENDENT_AMBULATORY_CARE_PROVIDER_SITE_OTHER): Payer: Medicare Other | Admitting: Surgery

## 2020-12-16 VITALS — BP 137/76 | HR 75 | Temp 97.8°F | Resp 20 | Ht 74.0 in | Wt 175.0 lb

## 2020-12-16 DIAGNOSIS — I70213 Atherosclerosis of native arteries of extremities with intermittent claudication, bilateral legs: Secondary | ICD-10-CM

## 2020-12-16 NOTE — Progress Notes (Signed)
[facility] [place]  Patient name: Harry Zamora MRN: [id number] DOB: 10-22-1940 Sex: male   REASON FOR VISIT:    Follow up  HISOTRY OF PRESENT ILLNESS:    Harry Zamora is a 80 y.o. male who I performed anterior exposure of L5-S1 for spinal stenosis on 09/07/2019.  At that time, he did not have palpable pedal pulses.  He was seen recently in the office with worsening bilateral foot pain.  He was given a trial of Pletal.  He is back for follow-up.  He describes his pain as a burning feeling.  .  His pain is worse with walking and limits how far he can walk.  It has gotten worse over the past 3 months.  He was started on Pletal at his last office visit, and since then he has not been complaining of pain.  He does not have any open wounds.  Patient suffers from diabetes.  He is medically managed for hypertension.  He is on dual antiplatelet therapy as well as a statin for hypercholesterolemia.  He suffers from coronary artery disease and is status post PCI in July 2022 by Dr. Einar Gip   PAST MEDICAL HISTORY:   Past Medical History:  Diagnosis Date   Anemia    as an infant   Arthritis    Back pain    Depression    Disc displacement, lumbar    Gynecomastia    History of kidney stones    Hyperlipidemia    Hypertension    no longer on medications   Hypertriglyceridemia    Insomnia    Low back pain    Lumbar radiculopathy    Lumbar stenosis    OA (osteoarthritis)    Polyneuropathy in diabetes(357.2)    Prostate cancer ([place]) 2003   Restless legs    Ringing in ears    RLS (restless legs syndrome)    Type 2 diabetes mellitus ([place])      FAMILY HISTORY:   Family History  Problem Relation Age of Onset   Arthritis Mother    Hypertension Mother    Diabetes Mother    Heart attack Mother    Cancer Father     SOCIAL HISTORY:   Social History   Tobacco Use   Smoking status: Light Smoker    Types: Cigars   Smokeless  tobacco: Never   Tobacco comments:    occasional  Substance Use Topics   Alcohol use: Not Currently    Comment: social-beer     ALLERGIES:   Allergies  Allergen Reactions   Levaquin [Levofloxacin In D5w] Rash   Niaspan [Niacin Er] Rash and Other (See Comments)    Flushing reaction   Tramadol Rash     CURRENT MEDICATIONS:   Current Outpatient Medications  Medication Sig Dispense Refill   aspirin EC 81 MG tablet Take 1 tablet (81 mg total) by mouth daily. 90 tablet 3   cilostazol (PLETAL) 100 MG tablet Take 1 tablet (100 mg total) by mouth 2 (two) times daily before a meal. 60 tablet 2   clopidogrel (PLAVIX) 75 MG tablet Take 1 tablet (75 mg total) by mouth daily. 90 tablet 3   Dulaglutide (TRULICITY) 1.5 EN/2.7PO SOPN Inject 1.5 mg into the skin every Wednesday.     DULoxetine (CYMBALTA) 30 MG capsule Take 30 mg by mouth daily.     FARXIGA 10 MG TABS tablet Take 10 mg by mouth daily.      gabapentin (NEURONTIN) 600  MG tablet Take 600 mg by mouth 2 (two) times daily.     metFORMIN (GLUCOPHAGE) 500 MG tablet Take 2 tablets (1,000 mg total) by mouth 2 (two) times daily with a meal. Resume 10/03/2020  1   metoprolol succinate (TOPROL-XL) 25 MG 24 hr tablet TAKE 1 TABLET(25 MG) BY MOUTH DAILY 90 tablet 0   nitroGLYCERIN (NITROSTAT) 0.4 MG SL tablet Place 1 tablet (0.4 mg total) under the tongue every 5 (five) minutes as needed for chest pain. 30 tablet 3   pregabalin (LYRICA) 75 MG capsule Take 75 mg by mouth 3 (three) times daily.     rOPINIRole (REQUIP) 4 MG tablet Take 4 mg by mouth at bedtime.     rosuvastatin (CRESTOR) 40 MG tablet Take 1 tablet (40 mg total) by mouth daily. 30 tablet 3   traMADol (ULTRAM) 50 MG tablet Take by mouth as needed.     vitamin B-12 (CYANOCOBALAMIN) 500 MCG tablet Take 500 mcg by mouth daily.     No current facility-administered medications for this visit.    REVIEW OF SYSTEMS:   [X]  denotes positive finding, [ ]  denotes negative finding Cardiac   Comments:  Chest pain or chest pressure:    Shortness of breath upon exertion:    Short of breath when lying flat:    Irregular heart rhythm:        Vascular    Pain in calf, thigh, or hip brought on by ambulation: x   Pain in feet at night that wakes you up from your sleep:     Blood clot in your veins:    Leg swelling:         Pulmonary    Oxygen at home:    Productive cough:     Wheezing:         Neurologic    Sudden weakness in arms or legs:     Sudden numbness in arms or legs:     Sudden onset of difficulty speaking or slurred speech:    Temporary loss of vision in one eye:     Problems with dizziness:         Gastrointestinal    Blood in stool:     Vomited blood:         Genitourinary    Burning when urinating:     Blood in urine:        Psychiatric    Major depression:         Hematologic    Bleeding problems:    Problems with blood clotting too easily:        Skin    Rashes or ulcers:        Constitutional    Fever or chills:      PHYSICAL EXAM:   There were no vitals filed for this visit.  GENERAL: The patient is a well-nourished male, in no acute distress. The vital signs are documented above. CARDIAC: There is a regular rate and rhythm.  VASCULAR: Palpable bilateral femoral and popliteal pulses.  Nonpalpable pedal pulses PULMONARY: Non-labored respirations MUSCULOSKELETAL: There are no major deformities or cyanosis. NEUROLOGIC: No focal weakness or paresthesias are detected. SKIN: There are no ulcers or rashes noted. PSYCHIATRIC: The patient has a normal affect.  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  I suspect a lot of his discomfort in his feet is related to his diabetic neuropathy.  No intervention is recommended at this time.  I stressed the importance of daily foot care.  He knows to contact me should he develop a nonhealing wound on his foot.  He is scheduled to have back surgery in November.  He should stop his Pletal 1 week prior to surgery.  I have him scheduled for follow-up in 1 year with ABIs and a lower extremity duplex    Annamarie Major, IV, MD, FACS [facility] [phone] Pager [phone]

## 2020-12-18 ENCOUNTER — Other Ambulatory Visit: Payer: Self-pay | Admitting: Neurosurgery

## 2020-12-26 ENCOUNTER — Ambulatory Visit: Payer: BC Managed Care – PPO | Admitting: Cardiology

## 2021-01-03 ENCOUNTER — Ambulatory Visit: Payer: BC Managed Care – PPO | Admitting: Cardiology

## 2021-01-07 ENCOUNTER — Ambulatory Visit: Payer: BC Managed Care – PPO | Admitting: Cardiology

## 2021-01-15 ENCOUNTER — Other Ambulatory Visit: Payer: Self-pay

## 2021-01-15 ENCOUNTER — Ambulatory Visit: Payer: BC Managed Care – PPO | Admitting: Cardiology

## 2021-01-15 ENCOUNTER — Encounter: Payer: Self-pay | Admitting: Cardiology

## 2021-01-15 VITALS — Temp 98.0°F | Resp 16 | Ht 74.0 in | Wt 174.0 lb

## 2021-01-15 DIAGNOSIS — R55 Syncope and collapse: Secondary | ICD-10-CM | POA: Diagnosis not present

## 2021-01-15 DIAGNOSIS — I739 Peripheral vascular disease, unspecified: Secondary | ICD-10-CM | POA: Diagnosis not present

## 2021-01-15 DIAGNOSIS — I25118 Atherosclerotic heart disease of native coronary artery with other forms of angina pectoris: Secondary | ICD-10-CM | POA: Diagnosis not present

## 2021-01-15 NOTE — Progress Notes (Signed)
Patient referred by [name], MD for syncope  Subjective:   Harry Zamora, male    DOB: 01/15/1941, 80 y.o.   MRN: [id number]   Chief Complaint  Patient presents with   PAD   Coronary Artery Disease   Follow-up    3 month     HPI  80 y.o. Caucasian male with uncontrolled type 2 diabetes mellitus, CAD, PAD, syncope, spinal stenosis s/p prior surgeries  Patient has not had any further syncope event since last visit. He is doing well and denies any cardiac complaints at this time.  OV note 12/16/2020: Patient recently had another syncope episode.  Few days ago, patient was at home.  He had chest stood up to walk to the TV around 1:30 PM.  This is then, he suddenly felt lightheaded for about 10 seconds before losing consciousness and falling down.  Patient's friend was nearby who helped him stand up.  He regained consciousness in about 10-15 seconds.  He does not recollect having had any chest pain, shortness of breath symptoms prior to his syncope.  He did not lose bowel or bladder tone.  After regaining consciousness he did not have any recurrent symptoms.  On a separate note, patient continues to have back pain and he is eager to undergo surgery soon.  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  He is very active, walks for 30 minutes most days, and works in the yard regularly.  At baseline, he does not have any symptoms of chest pain, shortness of breath, claudication with exertion.  He reports occasional episodes of shortness of breath just before going to bed.  He denies any orthopnea, PND, leg edema symptoms.  Patient with had an episode of syncope about 2 weeks ago.  He woke up at 6 AM, as usual, had his coffee.  He had not had his breakfast yet.  He was sitting at dining table.  He does not recollect having any chest pain, shortness of breath, lightheadedness symptoms.  Next thing he remembers that he was down on the floor  with his wife standing next to him.  His wife called his nephew, who assisted him to sit up.  The entire episode lasted for less than a minute.  On waking up, patient did not have any confusion, loss of bowel or bladder tone, tongue bite, seizures.  Patient did not have any other symptoms rest of the day.  He has not had any recurrence of similar episodes since then.  On a separate note, patient tells me that he has had spinal surgeries in the past, and was going to undergo surgery for removal of the rods today.  However, surgery was postponed given his syncope episode, as well as uncontrolled diabetes with A1c of 9.8%.  Patient denies any claudication symptoms.  However, he has noted an area on his second toe left foot.  This started as a area of redness which he reportedly tried to pick at.  Now the area is black and discolored.  He denies any pain at the site.   Current Outpatient Medications on File Prior to Visit  Medication Sig Dispense Refill   aspirin EC 81 MG tablet Take 1 tablet (81 mg total) by mouth daily. 90 tablet 3   cilostazol (PLETAL) 100 MG tablet Take 1 tablet (100 mg total) by mouth 2 (two) times daily before a meal. 60 tablet 2   clopidogrel (PLAVIX) 75 MG tablet  Take 1 tablet (75 mg total) by mouth daily. 90 tablet 3   Dulaglutide (TRULICITY) 1.5 HB/7.1IR SOPN Inject 1.5 mg into the skin every Wednesday.     DULoxetine (CYMBALTA) 30 MG capsule Take 30 mg by mouth daily.     FARXIGA 10 MG TABS tablet Take 10 mg by mouth daily.      gabapentin (NEURONTIN) 600 MG tablet Take 600 mg by mouth 2 (two) times daily.     metFORMIN (GLUCOPHAGE) 500 MG tablet Take 2 tablets (1,000 mg total) by mouth 2 (two) times daily with a meal. Resume 10/03/2020  1   metoprolol succinate (TOPROL-XL) 25 MG 24 hr tablet TAKE 1 TABLET(25 MG) BY MOUTH DAILY 90 tablet 0   nitroGLYCERIN (NITROSTAT) 0.4 MG SL tablet Place 1 tablet (0.4 mg total) under the tongue every 5 (five) minutes as needed for chest  pain. 30 tablet 3   pregabalin (LYRICA) 75 MG capsule Take 75 mg by mouth 3 (three) times daily.     rOPINIRole (REQUIP) 4 MG tablet Take 4 mg by mouth at bedtime.     rosuvastatin (CRESTOR) 40 MG tablet Take 1 tablet (40 mg total) by mouth daily. 30 tablet 3   traMADol (ULTRAM) 50 MG tablet Take by mouth as needed.     vitamin B-12 (CYANOCOBALAMIN) 500 MCG tablet Take 500 mcg by mouth daily.     No current facility-administered medications on file prior to visit.    Cardiovascular and other pertinent studies:  EKG 12/13/2020: Sinus rhythm 90 bpm with brief ventricular bigeminy Old anteroseptal infarct  Carotid duplex 12/11/2020: Right Carotid: Velocities in the right ICA are consistent with a 1-39%  stenosis.  Left Carotid: Velocities in the left ICA are consistent with a 1-39%  stenosis.  Vertebrals:  Bilateral vertebral arteries demonstrate antegrade flow.  Subclavians: Normal flow hemodynamics were seen in bilateral subclavian               arteries.   EKG 10/03/2020: Sinus rhythm 73 bpm Old anteroseptal infarct Nonspecific T-abnormality    Coronary intervention 10/01/2020: Successful percutaneous coronary intervention LAD and ramus with simultaneous T-stenting        PTCA and stent placement 3.0 X 38 mm Onyx Frontier drug-eluting stent Ostial-mid LAD        PTCA and stent placement 3.0 X 26 mm Resolute Onyx drug-eluting stent        Final kissing balloon angioplasty with 3.5X15 mm [place] balloon in LAD & 2.75X15 mm [place] balloon in ramus   LAD stenosis 90%-->0% Ramus stenosis 80%-->0% TIMI flow III-->III   EKG 08/08/2020: Sinus rhythm 76 bpm Cannot exclude old anteroseptal infarct  Coronary angiography 09/17/2020: LM: Normal LAD: Long ostial to mid LAD 90% stenosis        Small high diag 1 patent        Subtotally occluded diag 2, collaterals from OM Ramus: Medium sized vessel with mid 70% stenosis LCx: Large,dominant. Normal RCA: Small, non-dominant. Prox 60% stenosis    LVEDP normal   Obstructive CAD could possible be a cause of his syncope, although this is only speculative. He feels tired after walking for 2 miles, although denies any specific angina symptoms.  Based on his coronary angiogram findings, perioperative cardiac risk is elevated. Will discuss with Dr. Erline Levine re: urgency and nature of his upcoming spine surgery and then discuss further recommendations re: CAD  Mobile cardiac telemetry 11 days 08/28/2020 - 09/09/2020: Dominant rhythm: Sinus. HR 57-123 bpm. Avg HR 78  bpm, while in sinus rhythm. 17 episodes of SVT, fastest at 207 bpm for 13 beats, longest for 20 beats at 140 bpm. <1% isolated SVE, couplet/triplets. 1 episode of VT, at 162 bpm for 5 beats 2.2% isolated VE, <1% couplet No atrial fibrillation/atrial flutter/high grade AV block, sinus pause >3sec noted. 5 patient triggered events, all correlated with sinus rhythm  Exercise Tetrofosmin stress test 08/28/2020: Exercise nuclear stress test was performed using Bruce protocol. Patient reached 4.6 METS, and 93% of age predicted maximum heart rate. Exercise capacity was low. No chest pain reported. Heart rate and hemodynamic response were normal. Stress EKG showed sinus tachycardia, old anteroseptal infarct, no significant ST-T changes. SPECT images show small sized, severe intensity, reversible perfusion defect in apical anterior myocardium, with associated mild hypokinesis. Stress LVEF 47%. High risk study.  Echocardiogram 08/28/2020:    Mildly depressed LV systolic function with EF 50%. Left ventricle cavity  is normal in size. Mild concentric hypertrophy of the left ventricle.  Normal global wall motion. Doppler evidence of grade I (impaired)  diastolic dysfunction, elevated LAP. Calculated EF 50%.  Left atrial cavity is mildly dilated by volume index.  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  Lower Extremity Arterial Duplex 08/28/2020:  No hemodynamically significant  stenoses are identified in the right or the  left lower extremity arterial system.  There is monophasic waveform in the bilateral AT and PT vessels and left  PT has spectral broadening suggestive of severe diffuse disease.  This exam reveals moderately decreased perfusion of the right lower  extremity, noted at the anterior tibial artery level (ABI 0.73) and  moderately decreased perfusion of the left lower extremity, noted at the  post tibial artery level (ABI 0.73).   CT lumbar spine 06/26/2020: 1. Prior L3-S1 PLIF with fracture of the left S1 anterior screw and loosening of the right S1 anterior and posterior screws. The right posterior screw traverses the right L5-S1 facet joint. No significant interbody fusion at any level. 2. Residual moderate right neuroforaminal stenosis at L5-S1. Asymmetric underfilling of the right S1 nerve root with suspected scar tissue surrounding the nerve root in the lateral recess 3. Progressive adjacent segment disease at L2-L3 with new moderate spinal canal and right neuroforaminal stenosis. 4. Nonobstructive right nephrolithiasis. 5. Aortic Atherosclerosis (ICD10-I70.0).     ABI 08/30/2019: Right: Velocities suggest 50-74% stenosis in the proximal PTA and 30-49%  stenosis in the proximal Peroneal a. Tibial vessel disease.  Left: No obvious evidence of stenosis. Tibial vessel disease.    Recent labs: 10/03/2020: Glucose 206, BUN/Cr 27/1.38. EGFR 51. Na/K 141/5.8.  Chol 137, TG 121, HDL 33, LDL 82  10/01/2020: Glucose 169, BUN/Cr 31/1.38. EGFR 51. Na/K 137/5.8.  H/H 12/38. MCV 97. Platelets 234  08/05/2020: Glucose 232, BUN/Cr 28/1.26. EGFR 58. Na/K 137/4.4.  H/H 13/42. MCV 96. Platelets 206 HbA1C 9.5% Lipid panel N/A TSH N/A  09/20/2019: Chol 123, TG 183, HDL 32, LDL 54   Review of Systems  Cardiovascular:  Positive for syncope. Negative for chest pain, dyspnea on exertion, leg swelling and palpitations.       Orthostatic VS for the  past 72 hrs (Last 3 readings):  Orthostatic BP Patient Position BP Location Cuff Size Orthostatic Pulse  01/15/21 1157 136/68 Standing Left Arm Normal 84  01/15/21 1156 143/77 Sitting Left Arm Normal 79  01/15/21 1154 151/77 Supine Left Arm Normal 77     There were no vitals filed for this visit.   There is  no height or weight on file to calculate BMI. There were no vitals filed for this visit.  No data found.    Objective:   Physical Exam Vitals and nursing note reviewed.  Constitutional:      General: He is not in acute distress. Neck:     Vascular: No JVD.  Cardiovascular:     Rate and Rhythm: Normal rate and regular rhythm.     Pulses:          Femoral pulses are 2+ on the right side and 2+ on the left side.      Popliteal pulses are 2+ on the right side and 1+ on the left side.       Dorsalis pedis pulses are 2+ on the right side and 0 on the left side.       Posterior tibial pulses are 0 on the right side and 0 on the left side.     Heart sounds: Normal heart sounds. No murmur heard. Pulmonary:     Effort: Pulmonary effort is normal.     Breath sounds: Normal breath sounds. No wheezing or rales.  Musculoskeletal:     Right lower leg: No edema.     Left lower leg: No edema.        Assessment & Recommendations:   80 y.o. Caucasian male with uncontrolled type 2 diabetes mellitus, CAD, PAD, recurrent syncope, spinal stenosis s/p prior surgeries  Recurrent syncope: No recurrence since last visit, when he was found to have orthostatic hypotension.  I do not think he needs loop recorder placement at this time.   CAD:  Obstructive CAD with high risk anatomy, stable angina Successfully treated with LAD/ramus T stenting (10/01/2020) Continue plavix at least till 03/2021. Given that he is also on Aspirin, and pletal for PAD, I have stopped hi Aspirin. Continue metoprolol succinate 25 mg daily. Continue rosuvastatin 40 mg daily. Check lipid panel.  Cardiac risk  stratification: Okay to hold plavix for 5 days before spine surgery. Recommend resuming after the surgery, when deemed safe.  PAD: B/l ABI 0.73 (08/2020) No critical limb ischemia or lifestyle limiting claudication Previously had a focal eschar on left foot 2nd toe, which could have been an atheroembolic event. Likely source could be distal aortic arahc aulcer. Now completely healed. Continue medical management and regular walking Recommend plavix and pletal.  Distal aortic arch ulcer: Repeat CT in 6 months as per CVTS  Hyperkalemia: Prior history. Check BMP.   Nigel Mormon, MD Pager: [phone] Office: [phone]

## 2021-01-15 NOTE — Progress Notes (Deleted)
Patient referred by [name], MD for syncope  Subjective:   Harry Zamora, male    DOB: [birth year], 80 y.o.   MRN: [id number]   No chief complaint on file.    HPI  80 y.o. Caucasian male with uncontrolled type 2 diabetes mellitus, CAD, PAD, syncope, spinal stenosis s/p prior surgeries  Patient recently had another syncope episode.  Few days ago, patient was at home.  He had chest stood up to walk to the TV around 1:30 PM.  This is then, he suddenly felt lightheaded for about 10 seconds before losing consciousness and falling down.  Patient's friend was nearby who helped him stand up.  He regained consciousness in about 10-15 seconds.  He does not recollect having had any chest pain, shortness of breath symptoms prior to his syncope.  He did not lose bowel or bladder tone.  After regaining consciousness he did not have any recurrent symptoms.  On a separate note, patient continues to have back pain and he is eager to undergo surgery soon.  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  He is very active, walks for 30 minutes most days, and works in the yard regularly.  At baseline, he does not have any symptoms of chest pain, shortness of breath, claudication with exertion.  He reports occasional episodes of shortness of breath just before going to bed.  He denies any orthopnea, PND, leg edema symptoms.  Patient with had an episode of syncope about 2 weeks ago.  He woke up at 6 AM, as usual, had his coffee.  He had not had his breakfast yet.  He was sitting at dining table.  He does not recollect having any chest pain, shortness of breath, lightheadedness symptoms.  Next thing he remembers that he was down on the floor with his wife standing next to him.  His wife called his nephew, who assisted him to sit up.  The entire episode lasted for less than a minute.  On waking up, patient did not have any confusion, loss of bowel or bladder tone,  tongue bite, seizures.  Patient did not have any other symptoms rest of the day.  He has not had any recurrence of similar episodes since then.  On a separate note, patient tells me that he has had spinal surgeries in the past, and was going to undergo surgery for removal of the rods today.  However, surgery was postponed given his syncope episode, as well as uncontrolled diabetes with A1c of 9.8%.  Patient denies any claudication symptoms.  However, he has noted an area on his second toe left foot.  This started as a area of redness which he reportedly tried to pick at.  Now the area is black and discolored.  He denies any pain at the site.   Current Outpatient Medications on File Prior to Visit  Medication Sig Dispense Refill   aspirin EC 81 MG tablet Take 1 tablet (81 mg total) by mouth daily. 90 tablet 3   cilostazol (PLETAL) 100 MG tablet Take 1 tablet (100 mg total) by mouth 2 (two) times daily before a meal. 60 tablet 2   clopidogrel (PLAVIX) 75 MG tablet Take 1 tablet (75 mg total) by mouth daily. 90 tablet 3   Dulaglutide (TRULICITY) 1.5 OI/3.7CW SOPN Inject 1.5 mg into the skin every Wednesday.     DULoxetine (CYMBALTA) 30 MG capsule Take 30 mg by mouth daily.  FARXIGA 10 MG TABS tablet Take 10 mg by mouth daily.      gabapentin (NEURONTIN) 600 MG tablet Take 600 mg by mouth 2 (two) times daily.     metFORMIN (GLUCOPHAGE) 500 MG tablet Take 2 tablets (1,000 mg total) by mouth 2 (two) times daily with a meal. Resume 10/03/2020  1   metoprolol succinate (TOPROL-XL) 25 MG 24 hr tablet TAKE 1 TABLET(25 MG) BY MOUTH DAILY 90 tablet 0   nitroGLYCERIN (NITROSTAT) 0.4 MG SL tablet Place 1 tablet (0.4 mg total) under the tongue every 5 (five) minutes as needed for chest pain. 30 tablet 3   pregabalin (LYRICA) 75 MG capsule Take 75 mg by mouth 3 (three) times daily.     rOPINIRole (REQUIP) 4 MG tablet Take 4 mg by mouth at bedtime.     rosuvastatin (CRESTOR) 40 MG tablet Take 1 tablet (40 mg  total) by mouth daily. 30 tablet 3   traMADol (ULTRAM) 50 MG tablet Take by mouth as needed.     vitamin B-12 (CYANOCOBALAMIN) 500 MCG tablet Take 500 mcg by mouth daily.     No current facility-administered medications on file prior to visit.    Cardiovascular and other pertinent studies:  EKG 12/13/2020: Sinus rhythm 90 bpm with brief ventricular bigeminy Old anteroseptal infarct  Carotid duplex 12/11/2020: Right Carotid: Velocities in the right ICA are consistent with a 1-39%  stenosis.  Left Carotid: Velocities in the left ICA are consistent with a 1-39%  stenosis.  Vertebrals:  Bilateral vertebral arteries demonstrate antegrade flow.  Subclavians: Normal flow hemodynamics were seen in bilateral subclavian               arteries.   EKG 10/03/2020: Sinus rhythm 73 bpm Old anteroseptal infarct Nonspecific T-abnormality    Coronary intervention 10/01/2020: Successful percutaneous coronary intervention LAD and ramus with simultaneous T-stenting        PTCA and stent placement 3.0 X 38 mm Onyx Frontier drug-eluting stent Ostial-mid LAD        PTCA and stent placement 3.0 X 26 mm Resolute Onyx drug-eluting stent        Final kissing balloon angioplasty with 3.5X15 mm [place] balloon in LAD & 2.75X15 mm [place] balloon in ramus   LAD stenosis 90%-->0% Ramus stenosis 80%-->0% TIMI flow III-->III   EKG 08/08/2020: Sinus rhythm 76 bpm Cannot exclude old anteroseptal infarct  Coronary angiography 09/17/2020: LM: Normal LAD: Long ostial to mid LAD 90% stenosis        Small high diag 1 patent        Subtotally occluded diag 2, collaterals from OM Ramus: Medium sized vessel with mid 70% stenosis LCx: Large,dominant. Normal RCA: Small, non-dominant. Prox 60% stenosis   LVEDP normal   Obstructive CAD could possible be a cause of his syncope, although this is only speculative. He feels tired after walking for 2 miles, although denies any specific angina symptoms.  Based on his coronary  angiogram findings, perioperative cardiac risk is elevated. Will discuss with Dr. Erline Levine re: urgency and nature of his upcoming spine surgery and then discuss further recommendations re: CAD  Mobile cardiac telemetry 11 days 08/28/2020 - 09/09/2020: Dominant rhythm: Sinus. HR 57-123 bpm. Avg HR 78 bpm, while in sinus rhythm. 17 episodes of SVT, fastest at 207 bpm for 13 beats, longest for 20 beats at 140 bpm. <1% isolated SVE, couplet/triplets. 1 episode of VT, at 162 bpm for 5 beats 2.2% isolated VE, <1% couplet No atrial fibrillation/atrial flutter/high  grade AV block, sinus pause >3sec noted. 5 patient triggered events, all correlated with sinus rhythm  Exercise Tetrofosmin stress test 08/28/2020: Exercise nuclear stress test was performed using Bruce protocol. Patient reached 4.6 METS, and 93% of age predicted maximum heart rate. Exercise capacity was low. No chest pain reported. Heart rate and hemodynamic response were normal. Stress EKG showed sinus tachycardia, old anteroseptal infarct, no significant ST-T changes. SPECT images show small sized, severe intensity, reversible perfusion defect in apical anterior myocardium, with associated mild hypokinesis. Stress LVEF 47%. High risk study.  Echocardiogram 08/28/2020:    Mildly depressed LV systolic function with EF 50%. Left ventricle cavity  is normal in size. Mild concentric hypertrophy of the left ventricle.  Normal global wall motion. Doppler evidence of grade I (impaired)  diastolic dysfunction, elevated LAP. Calculated EF 50%.  Left atrial cavity is mildly dilated by volume index.  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  Lower Extremity Arterial Duplex 08/28/2020:  No hemodynamically significant stenoses are identified in the right or the  left lower extremity arterial system.  There is monophasic waveform in the bilateral AT and PT vessels and left  PT has spectral broadening suggestive of severe diffuse disease.   This exam reveals moderately decreased perfusion of the right lower  extremity, noted at the anterior tibial artery level (ABI 0.73) and  moderately decreased perfusion of the left lower extremity, noted at the  post tibial artery level (ABI 0.73).   CT lumbar spine 06/26/2020: 1. Prior L3-S1 PLIF with fracture of the left S1 anterior screw and loosening of the right S1 anterior and posterior screws. The right posterior screw traverses the right L5-S1 facet joint. No significant interbody fusion at any level. 2. Residual moderate right neuroforaminal stenosis at L5-S1. Asymmetric underfilling of the right S1 nerve root with suspected scar tissue surrounding the nerve root in the lateral recess 3. Progressive adjacent segment disease at L2-L3 with new moderate spinal canal and right neuroforaminal stenosis. 4. Nonobstructive right nephrolithiasis. 5. Aortic Atherosclerosis (ICD10-I70.0).     ABI 08/30/2019: Right: Velocities suggest 50-74% stenosis in the proximal PTA and 30-49%  stenosis in the proximal Peroneal a. Tibial vessel disease.  Left: No obvious evidence of stenosis. Tibial vessel disease.    Recent labs: 10/03/2020: Glucose 206, BUN/Cr 27/1.38. EGFR 51. Na/K 141/5.8.  Chol 137, TG 121, HDL 33, LDL 82  10/01/2020: Glucose 169, BUN/Cr 31/1.38. EGFR 51. Na/K 137/5.8.  H/H 12/38. MCV 97. Platelets 234  08/05/2020: Glucose 232, BUN/Cr 28/1.26. EGFR 58. Na/K 137/4.4.  H/H 13/42. MCV 96. Platelets 206 HbA1C 9.5% Lipid panel N/A TSH N/A  09/20/2019: Chol 123, TG 183, HDL 32, LDL 54   Review of Systems  Cardiovascular:  Positive for syncope. Negative for chest pain, dyspnea on exertion, leg swelling and palpitations.        There were no vitals filed for this visit.   There is no height or weight on file to calculate BMI. There were no vitals filed for this visit.  No data found.    Objective:   Physical Exam Vitals and nursing note reviewed.   Constitutional:      General: He is not in acute distress. Neck:     Vascular: No JVD.  Cardiovascular:     Rate and Rhythm: Normal rate and regular rhythm.     Pulses:          Femoral pulses are 2+ on the right side and 2+ on the left  side.      Popliteal pulses are 2+ on the right side and 1+ on the left side.       Dorsalis pedis pulses are 2+ on the right side and 0 on the left side.       Posterior tibial pulses are 0 on the right side and 0 on the left side.     Heart sounds: Normal heart sounds. No murmur heard. Pulmonary:     Effort: Pulmonary effort is normal.     Breath sounds: Normal breath sounds. No wheezing or rales.  Musculoskeletal:     Right lower leg: No edema.     Left lower leg: No edema.        Assessment & Recommendations:   80 y.o. Caucasian male with uncontrolled type 2 diabetes mellitus, CAD, PAD, recurrent syncope, spinal stenosis s/p prior surgeries  Recurrent syncope: Orthostatics positive in the office today.  EKG shows brief ventricular bigeminy.  I do suspect his syncope was orthostatic in nature.  Recommend increasing hydration and wearing compression stockings.  Given that he has had PVCs on EKG today, I will start with external monitor for 2 weeks to assess PVC burden.  I will see him back in 3 weeks.  If orthostatics have resolved and still has syncope episodes, will then consider loop recorder placement.  Currently, he is on dual antiplatelet therapy.  Aspirin is possible, I would like to continue uninterrupted dual antiplatelet therapy given his complex stenting in 09/2020.  This does increase his bleeding risk with any procedure, including loop recorder placement.  Given that we have an alternate explanation for his syncope, I will hold off loop recorder placement as of today.  CAD:  Obstructive CAD with high risk anatomy, stable angina Successfully treated with LAD/ramus T stenting (10/01/2020) Recommend DAPT with Aspirin and plavix at least  till 03/2021 Continue metoprolol succinate 25 mg daily. Continue rosuvastatin 40 mg daily. Check lipid panel.  Cardiac risk stratification: Ideally, I would like him to be on uninterrupted dual antiplatelet therapy for 6 months starting 09/2020.  However, if his back pain is becoming lifestyle limiting, could potentially consider interruption of dual antiplatelet therapy for back surgery after November 2022.  PAD: B/l ABI 0.73 (08/2020) No critical limb ischemia or lifestyle limiting claudication Previously had a focal eschar on left foot 2nd toe, which could have been an atheroembolic event. Likely source could be distal aortic arahc aulcer. Now completely healed. 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 Check BMP.   Harry Mormon, MD Pager: [phone] Office: [phone]

## 2021-01-17 ENCOUNTER — Ambulatory Visit: Payer: BC Managed Care – PPO | Admitting: Cardiology

## 2021-02-12 ENCOUNTER — Other Ambulatory Visit: Payer: Self-pay | Admitting: Cardiology

## 2021-02-12 DIAGNOSIS — I25118 Atherosclerotic heart disease of native coronary artery with other forms of angina pectoris: Secondary | ICD-10-CM

## 2021-02-26 ENCOUNTER — Other Ambulatory Visit: Payer: Self-pay | Admitting: Neurological Surgery

## 2021-03-12 ENCOUNTER — Inpatient Hospital Stay (HOSPITAL_BASED_OUTPATIENT_CLINIC_OR_DEPARTMENT_OTHER)
Admission: EM | Admit: 2021-03-12 | Discharge: 2021-03-14 | DRG: 871 | Disposition: A | Discharge status: Home / Self Care | Payer: Medicare HMO | Attending: Student | Admitting: Student

## 2021-03-12 ENCOUNTER — Encounter (HOSPITAL_BASED_OUTPATIENT_CLINIC_OR_DEPARTMENT_OTHER): Payer: Self-pay | Admitting: *Deleted

## 2021-03-12 ENCOUNTER — Other Ambulatory Visit: Payer: Self-pay

## 2021-03-12 ENCOUNTER — Emergency Department (HOSPITAL_BASED_OUTPATIENT_CLINIC_OR_DEPARTMENT_OTHER): Payer: Medicare HMO | Admitting: Radiology

## 2021-03-12 DIAGNOSIS — G2581 Restless legs syndrome: Secondary | ICD-10-CM | POA: Diagnosis not present

## 2021-03-12 DIAGNOSIS — J189 Pneumonia, unspecified organism: Secondary | ICD-10-CM | POA: Diagnosis present

## 2021-03-12 DIAGNOSIS — N179 Acute kidney failure, unspecified: Secondary | ICD-10-CM | POA: Diagnosis not present

## 2021-03-12 DIAGNOSIS — I2510 Atherosclerotic heart disease of native coronary artery without angina pectoris: Secondary | ICD-10-CM | POA: Diagnosis present

## 2021-03-12 DIAGNOSIS — E781 Pure hyperglyceridemia: Secondary | ICD-10-CM | POA: Diagnosis present

## 2021-03-12 DIAGNOSIS — I25118 Atherosclerotic heart disease of native coronary artery with other forms of angina pectoris: Secondary | ICD-10-CM | POA: Diagnosis present

## 2021-03-12 DIAGNOSIS — R296 Repeated falls: Secondary | ICD-10-CM | POA: Diagnosis present

## 2021-03-12 DIAGNOSIS — B974 Respiratory syncytial virus as the cause of diseases classified elsewhere: Secondary | ICD-10-CM | POA: Diagnosis present

## 2021-03-12 DIAGNOSIS — I130 Hypertensive heart and chronic kidney disease with heart failure and stage 1 through stage 4 chronic kidney disease, or unspecified chronic kidney disease: Secondary | ICD-10-CM | POA: Diagnosis not present

## 2021-03-12 DIAGNOSIS — I5042 Chronic combined systolic (congestive) and diastolic (congestive) heart failure: Secondary | ICD-10-CM | POA: Diagnosis present

## 2021-03-12 DIAGNOSIS — R000 Tachycardia, unspecified: Secondary | ICD-10-CM | POA: Diagnosis not present

## 2021-03-12 DIAGNOSIS — M6282 Rhabdomyolysis: Secondary | ICD-10-CM | POA: Diagnosis present

## 2021-03-12 DIAGNOSIS — A419 Sepsis, unspecified organism: Principal | ICD-10-CM | POA: Diagnosis present

## 2021-03-12 DIAGNOSIS — R6520 Severe sepsis without septic shock: Secondary | ICD-10-CM

## 2021-03-12 DIAGNOSIS — Z8546 Personal history of malignant neoplasm of prostate: Secondary | ICD-10-CM

## 2021-03-12 DIAGNOSIS — N1832 Chronic kidney disease, stage 3b: Secondary | ICD-10-CM | POA: Diagnosis not present

## 2021-03-12 DIAGNOSIS — R059 Cough, unspecified: Secondary | ICD-10-CM | POA: Diagnosis present

## 2021-03-12 DIAGNOSIS — B338 Other specified viral diseases: Secondary | ICD-10-CM | POA: Diagnosis not present

## 2021-03-12 DIAGNOSIS — J9601 Acute respiratory failure with hypoxia: Secondary | ICD-10-CM | POA: Diagnosis present

## 2021-03-12 DIAGNOSIS — E1142 Type 2 diabetes mellitus with diabetic polyneuropathy: Secondary | ICD-10-CM | POA: Diagnosis not present

## 2021-03-12 DIAGNOSIS — Z7902 Long term (current) use of antithrombotics/antiplatelets: Secondary | ICD-10-CM

## 2021-03-12 DIAGNOSIS — Z7984 Long term (current) use of oral hypoglycemic drugs: Secondary | ICD-10-CM

## 2021-03-12 DIAGNOSIS — F32A Depression, unspecified: Secondary | ICD-10-CM | POA: Diagnosis present

## 2021-03-12 DIAGNOSIS — Z8249 Family history of ischemic heart disease and other diseases of the circulatory system: Secondary | ICD-10-CM

## 2021-03-12 DIAGNOSIS — R350 Frequency of micturition: Secondary | ICD-10-CM | POA: Diagnosis present

## 2021-03-12 DIAGNOSIS — Z79899 Other long term (current) drug therapy: Secondary | ICD-10-CM

## 2021-03-12 DIAGNOSIS — E1165 Type 2 diabetes mellitus with hyperglycemia: Secondary | ICD-10-CM | POA: Diagnosis present

## 2021-03-12 DIAGNOSIS — E1122 Type 2 diabetes mellitus with diabetic chronic kidney disease: Secondary | ICD-10-CM | POA: Diagnosis not present

## 2021-03-12 DIAGNOSIS — I739 Peripheral vascular disease, unspecified: Secondary | ICD-10-CM | POA: Diagnosis present

## 2021-03-12 DIAGNOSIS — Z833 Family history of diabetes mellitus: Secondary | ICD-10-CM

## 2021-03-12 DIAGNOSIS — G4700 Insomnia, unspecified: Secondary | ICD-10-CM | POA: Diagnosis present

## 2021-03-12 DIAGNOSIS — Z20822 Contact with and (suspected) exposure to covid-19: Secondary | ICD-10-CM | POA: Diagnosis present

## 2021-03-12 DIAGNOSIS — E11649 Type 2 diabetes mellitus with hypoglycemia without coma: Secondary | ICD-10-CM | POA: Diagnosis not present

## 2021-03-12 DIAGNOSIS — E119 Type 2 diabetes mellitus without complications: Secondary | ICD-10-CM

## 2021-03-12 DIAGNOSIS — E785 Hyperlipidemia, unspecified: Secondary | ICD-10-CM | POA: Diagnosis present

## 2021-03-12 DIAGNOSIS — N1831 Chronic kidney disease, stage 3a: Secondary | ICD-10-CM | POA: Diagnosis present

## 2021-03-12 DIAGNOSIS — Z955 Presence of coronary angioplasty implant and graft: Secondary | ICD-10-CM

## 2021-03-12 DIAGNOSIS — R748 Abnormal levels of other serum enzymes: Secondary | ICD-10-CM | POA: Diagnosis present

## 2021-03-12 DIAGNOSIS — Z888 Allergy status to other drugs, medicaments and biological substances status: Secondary | ICD-10-CM

## 2021-03-12 DIAGNOSIS — Z87442 Personal history of urinary calculi: Secondary | ICD-10-CM | POA: Diagnosis not present

## 2021-03-12 DIAGNOSIS — E1151 Type 2 diabetes mellitus with diabetic peripheral angiopathy without gangrene: Secondary | ICD-10-CM | POA: Diagnosis present

## 2021-03-12 DIAGNOSIS — D72825 Bandemia: Secondary | ICD-10-CM | POA: Diagnosis not present

## 2021-03-12 DIAGNOSIS — Z981 Arthrodesis status: Secondary | ICD-10-CM

## 2021-03-12 DIAGNOSIS — Z9049 Acquired absence of other specified parts of digestive tract: Secondary | ICD-10-CM

## 2021-03-12 DIAGNOSIS — R918 Other nonspecific abnormal finding of lung field: Secondary | ICD-10-CM | POA: Diagnosis not present

## 2021-03-12 DIAGNOSIS — Z87891 Personal history of nicotine dependence: Secondary | ICD-10-CM

## 2021-03-12 HISTORY — DX: Pneumonia, unspecified organism: J18.9

## 2021-03-12 LAB — PROTIME-INR
INR: 1.1 (ref 0.8–1.2)
Prothrombin Time: 14.1 seconds (ref 11.4–15.2)

## 2021-03-12 LAB — CBC WITH DIFFERENTIAL/PLATELET
Abs Immature Granulocytes: 0.09 10*3/uL — ABNORMAL HIGH (ref 0.00–0.07)
Basophils Absolute: 0 10*3/uL (ref 0.0–0.1)
Basophils Relative: 0 %
Eosinophils Absolute: 0 10*3/uL (ref 0.0–0.5)
Eosinophils Relative: 0 %
HCT: 35.3 % — ABNORMAL LOW (ref 39.0–52.0)
Hemoglobin: 11.3 g/dL — ABNORMAL LOW (ref 13.0–17.0)
Immature Granulocytes: 1 %
Lymphocytes Relative: 8 %
Lymphs Abs: 1 10*3/uL (ref 0.7–4.0)
MCH: 29.6 pg (ref 26.0–34.0)
MCHC: 32 g/dL (ref 30.0–36.0)
MCV: 92.4 fL (ref 80.0–100.0)
Monocytes Absolute: 1.2 10*3/uL — ABNORMAL HIGH (ref 0.1–1.0)
Monocytes Relative: 9 %
Neutro Abs: 11.2 10*3/uL — ABNORMAL HIGH (ref 1.7–7.7)
Neutrophils Relative %: 82 %
Platelets: 205 10*3/uL (ref 150–400)
RBC: 3.82 MIL/uL — ABNORMAL LOW (ref 4.22–5.81)
RDW: 14.1 % (ref 11.5–15.5)
WBC: 13.5 10*3/uL — ABNORMAL HIGH (ref 4.0–10.5)
nRBC: 0 % (ref 0.0–0.2)

## 2021-03-12 LAB — COMPREHENSIVE METABOLIC PANEL
ALT: 22 U/L (ref 0–44)
AST: 52 U/L — ABNORMAL HIGH (ref 15–41)
Albumin: 3.9 g/dL (ref 3.5–5.0)
Alkaline Phosphatase: 75 U/L (ref 38–126)
Anion gap: 15 (ref 5–15)
BUN: 41 mg/dL — ABNORMAL HIGH (ref 8–23)
CO2: 24 mmol/L (ref 22–32)
Calcium: 9.3 mg/dL (ref 8.9–10.3)
Chloride: 97 mmol/L — ABNORMAL LOW (ref 98–111)
Creatinine, Ser: 1.63 mg/dL — ABNORMAL HIGH (ref 0.61–1.24)
GFR, Estimated: 42 mL/min — ABNORMAL LOW (ref >60)
Glucose, Bld: 154 mg/dL — ABNORMAL HIGH (ref 70–99)
Potassium: 3.9 mmol/L (ref 3.5–5.1)
Sodium: 136 mmol/L (ref 135–145)
Total Bilirubin: 1.3 mg/dL — ABNORMAL HIGH (ref 0.3–1.2)
Total Protein: 7.5 g/dL (ref 6.5–8.1)

## 2021-03-12 LAB — URINALYSIS, ROUTINE W REFLEX MICROSCOPIC
Bilirubin Urine: NEGATIVE
Glucose, UA: >1000 mg/dL — AB
Ketones, ur: 15 mg/dL — AB
Leukocytes,Ua: NEGATIVE
Nitrite: NEGATIVE
Protein, ur: 100 mg/dL — AB
Specific Gravity, Urine: 1.016 (ref 1.005–1.030)
pH: 5.5 (ref 5.0–8.0)

## 2021-03-12 LAB — BRAIN NATRIURETIC PEPTIDE: B Natriuretic Peptide: 309.9 pg/mL — ABNORMAL HIGH (ref 0.0–100.0)

## 2021-03-12 LAB — RESP PANEL BY RT-PCR (FLU A&B, COVID) ARPGX2
Influenza A by PCR: NEGATIVE
Influenza B by PCR: NEGATIVE
SARS Coronavirus 2 by RT PCR: NEGATIVE

## 2021-03-12 LAB — LACTIC ACID, PLASMA: Lactic Acid, Venous: 1.4 mmol/L (ref 0.5–1.9)

## 2021-03-12 LAB — PROCALCITONIN: Procalcitonin: 10.92 ng/mL

## 2021-03-12 LAB — TYPE AND SCREEN
ABO/RH(D): O POS
Antibody Screen: NEGATIVE

## 2021-03-12 LAB — LIPASE, BLOOD: Lipase: 38 U/L (ref 11–51)

## 2021-03-12 LAB — CK: Total CK: 854 U/L — ABNORMAL HIGH (ref 49–397)

## 2021-03-12 LAB — GLUCOSE, CAPILLARY: Glucose-Capillary: 120 mg/dL — ABNORMAL HIGH (ref 70–99)

## 2021-03-12 LAB — CBG MONITORING, ED: Glucose-Capillary: 171 mg/dL — ABNORMAL HIGH (ref 70–99)

## 2021-03-12 IMAGING — DX DG CHEST 1V PORT
1 series · 1 of 1 positions shown · non-contrast
Comparison: [DATE]

CLINICAL DATA: Suspected Sepsis

EXAM:
PORTABLE CHEST 1 VIEW

[chest]
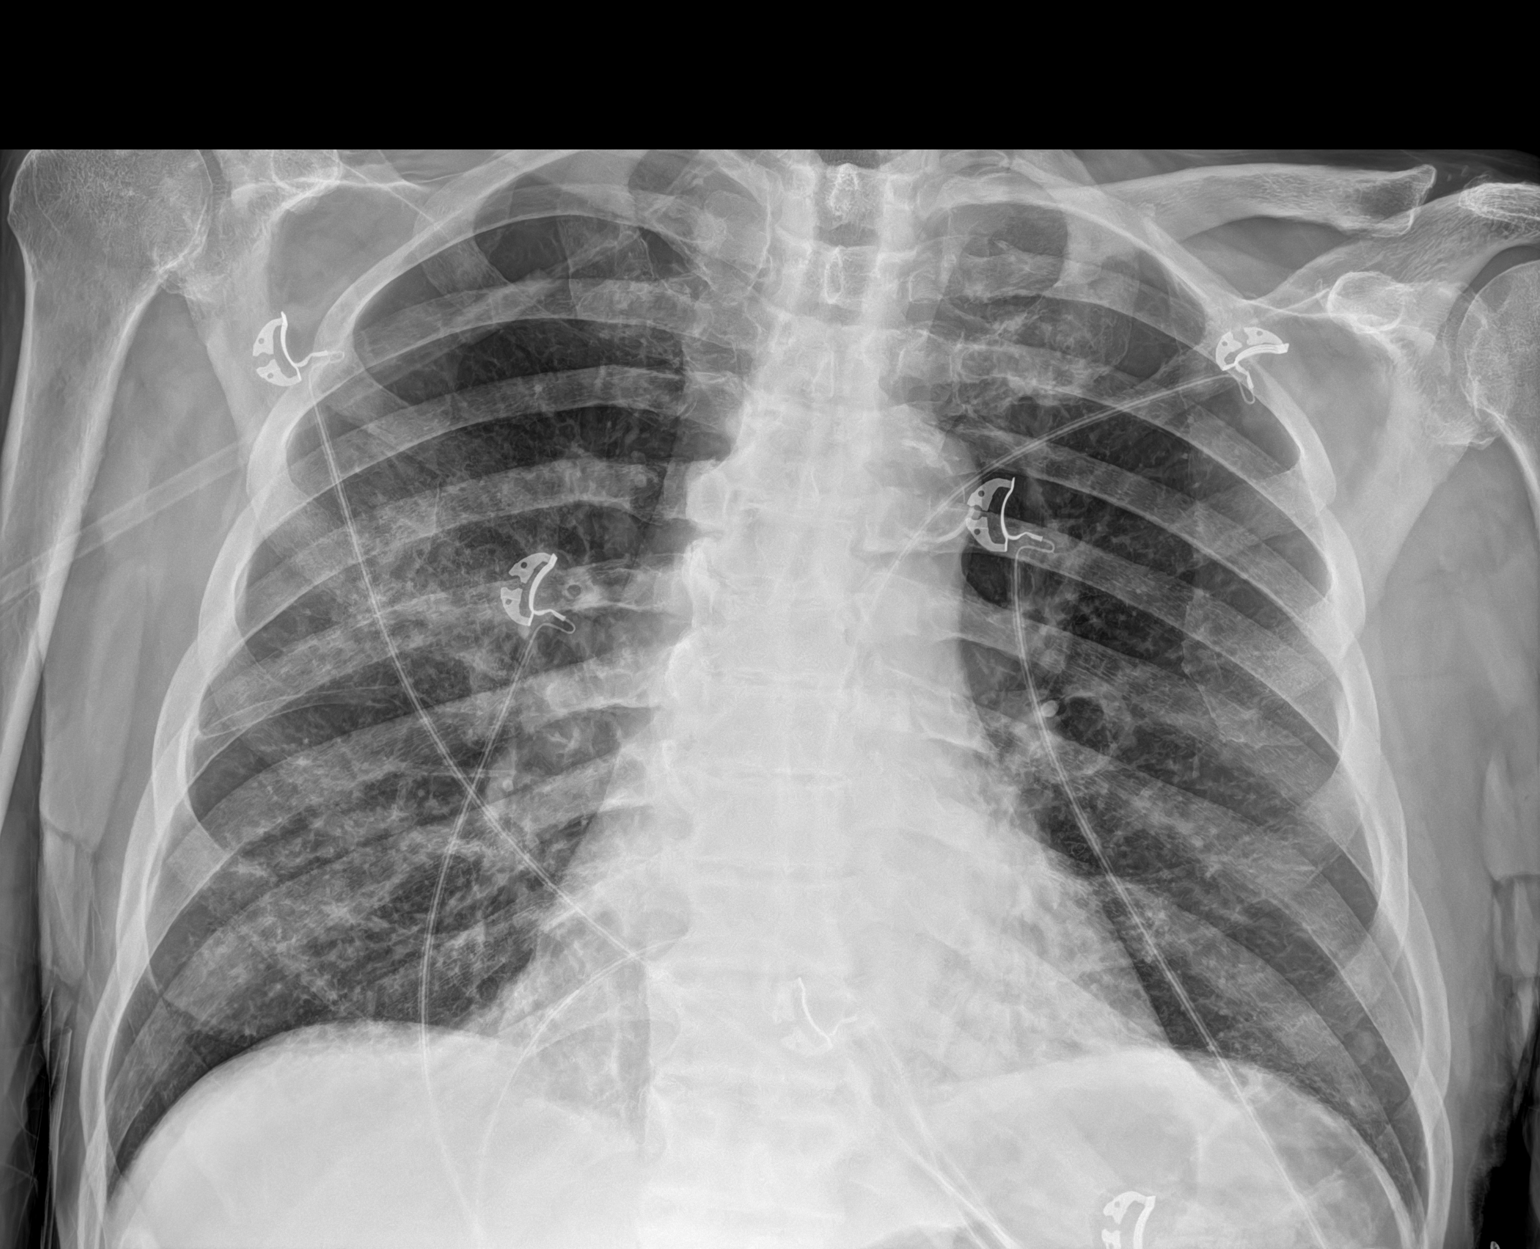

[1 of 1 positions shown; findings below may reference images not displayed]

FINDINGS: Patchy interstitial opacities bilaterally. No pleural effusion or
pneumothorax. Normal heart size.
IMPRESSION: Patchy interstitial opacities bilaterally suspicious for
atypical/viral pneumonia.

## 2021-03-12 MED ORDER — CHLORHEXIDINE GLUCONATE CLOTH 2 % EX PADS: 6.0000 | MEDICATED_PAD | Freq: Once | CUTANEOUS | Status: DC

## 2021-03-12 MED ORDER — CEFAZOLIN SODIUM-DEXTROSE 2-4 GM/100ML-% IV SOLN
2.0000 g | INTRAVENOUS | Status: DC
  Filled 2021-03-12: qty 100

## 2021-03-12 MED ORDER — LACTATED RINGERS IV BOLUS
500.0000 mL | Freq: Once | INTRAVENOUS | Status: AC
  Administered 2021-03-12: 11:00:00 500 mL via INTRAVENOUS

## 2021-03-12 MED ORDER — SODIUM CHLORIDE 0.9 % IV SOLN
500.0000 mg | Freq: Once | INTRAVENOUS | Status: AC
  Administered 2021-03-12: 11:00:00 500 mg via INTRAVENOUS
  Filled 2021-03-12: qty 5

## 2021-03-12 MED ORDER — ROSUVASTATIN CALCIUM 10 MG PO TABS: 40.0000 mg | ORAL_TABLET | Freq: Every day | ORAL | Status: DC

## 2021-03-12 MED ORDER — ACETAMINOPHEN 325 MG PO TABS
650.0000 mg | ORAL_TABLET | Freq: Once | ORAL | Status: AC
  Administered 2021-03-12: 11:00:00 650 mg via ORAL
  Filled 2021-03-12: qty 2

## 2021-03-12 MED ORDER — ACETAMINOPHEN 325 MG PO TABS
650.0000 mg | ORAL_TABLET | Freq: Four times a day (QID) | ORAL | Status: DC | PRN
  Administered 2021-03-13 (×2): 650 mg via ORAL
  Filled 2021-03-12 (×3): qty 2

## 2021-03-12 MED ORDER — GUAIFENESIN ER 600 MG PO TB12
600.0000 mg | ORAL_TABLET | Freq: Two times a day (BID) | ORAL | Status: DC
  Administered 2021-03-12 – 2021-03-14 (×4): 600 mg via ORAL
  Filled 2021-03-12 (×4): qty 1

## 2021-03-12 MED ORDER — INSULIN ASPART 100 UNIT/ML IJ SOLN
0.0000 [IU] | INTRAMUSCULAR | Status: DC
  Administered 2021-03-12: 21:00:00 2 [IU] via SUBCUTANEOUS
  Administered 2021-03-13: 3 [IU] via SUBCUTANEOUS
  Administered 2021-03-13: 13:00:00 2 [IU] via SUBCUTANEOUS
  Administered 2021-03-14: 09:00:00 1 [IU] via SUBCUTANEOUS

## 2021-03-12 MED ORDER — ALBUTEROL SULFATE (2.5 MG/3ML) 0.083% IN NEBU: 2.5000 mg | INHALATION_SOLUTION | RESPIRATORY_TRACT | Status: DC | PRN

## 2021-03-12 MED ORDER — SODIUM CHLORIDE 0.9 % IV SOLN
75.0000 mL/h | INTRAVENOUS | Status: AC
  Administered 2021-03-12: 21:00:00 75 mL/h via INTRAVENOUS

## 2021-03-12 MED ORDER — SODIUM CHLORIDE 0.9 % IV SOLN
2.0000 g | INTRAVENOUS | Status: DC
  Administered 2021-03-13: 12:00:00 2 g via INTRAVENOUS
  Filled 2021-03-12 (×2): qty 20

## 2021-03-12 MED ORDER — CEFAZOLIN SODIUM-DEXTROSE 2-4 GM/100ML-% IV SOLN: 2.0000 g | INTRAVENOUS | Status: DC

## 2021-03-12 MED ORDER — SODIUM CHLORIDE 0.9 % IV SOLN
500.0000 mg | INTRAVENOUS | Status: DC
  Administered 2021-03-13: 11:00:00 500 mg via INTRAVENOUS
  Filled 2021-03-12 (×2): qty 5

## 2021-03-12 MED ORDER — SODIUM CHLORIDE 0.9 % IV SOLN
1.0000 g | Freq: Once | INTRAVENOUS | Status: AC
  Administered 2021-03-12: 11:00:00 1 g via INTRAVENOUS
  Filled 2021-03-12: qty 10

## 2021-03-12 MED ORDER — ACETAMINOPHEN 650 MG RE SUPP: 650.0000 mg | Freq: Four times a day (QID) | RECTAL | Status: DC | PRN

## 2021-03-12 NOTE — Progress Notes (Signed)
Notified by ED[facility] of need for admission d/t PNA. [facility] accepts patient to tele bed at [facility]. [facility] is to remain responsible for orders/medical decisions while patient is holding at [facility]. Upon arrival to [facility], [facility] will assume care. Nursing staff will call patient placement to notify them of patient's arrival so that the proper [facility] member may receive the patient. Thank you.

## 2021-03-12 NOTE — H&P (Signed)
[name] [facility] SRP:[id number] DOB: Jul 23, 1940 DOA: 03/12/2021   PCP: [name], MD   Outpatient Specialists:   CARDS:  Dr. Juanna Cao [facility]  Patient arrived to ER on 03/12/21 at 0949 Referred by Attending No att. providers found   Patient coming from: home Lives  With family    Chief Complaint:   Chief Complaint  Patient presents with   Nausea   Dizziness   Weakness   Cough   Urinary Frequency    HPI: Harry Zamora is a 80 y.o. male with medical history significant of anemia, HLD, HTN DM2, RLS    Presented with   cough and fevers  several days of fevers, chills, cough  He lives with his wife denies any sick contacts.  He been having a bit of a nausea but able to keep his medicines down.  Low-grade fever  100.5 At baseline not on oxygen. Has been taking his Plavix as prescribed Has   been vaccinated against COVID  and boosted had   flu shot   Initial COVID TEST  NEGATIVE  Lab Results  Component Value Date   [place] 03/12/2021   [place] NEGATIVE 09/12/2020   [place] NEGATIVE 08/05/2020   [place] NEGATIVE 09/05/2019     Regarding pertinent Chronic problems:     Hyperlipidemia -  on statins Crestor Lipid Panel     Component Value Date/Time   CHOL 137 10/03/2020 1138   TRIG 121 10/03/2020 1138   HDL 33 (L) 10/03/2020 1138   CHOLHDL 4.2 10/03/2020 1138   [place] 82 10/03/2020 1138   LABVLDL 22 10/03/2020 1138     HTN on Toprol   chronic CHF  systolic/diastolic combined- last echo June 2022 EF 50%  grade I (impaired)  diastolic dysfunction,   CAD  - On Aspirin, statin, betablocker, Plavix                 -  followed by cardiology                - last cardiac cath  09/17/2020     DM 2 -  Lab Results  Component Value Date   HGBA1C 9.5 (H) 08/05/2020   on  farxiga     CKD stage IIIb- baseline Cr  1.4 Estimated Creatinine Clearance: 40.8 mL/min (A) (by C-G formula based on SCr of 1.63 mg/dL  (H)).  Lab Results  Component Value Date   CREATININE 1.63 (H) 03/12/2021   CREATININE 1.38 (H) 10/03/2020   CREATININE 1.38 (H) 10/01/2020    Chronic anemia - baseline hg Hemoglobin & Hematocrit  Recent Labs    09/13/20 0157 10/01/20 0759 03/12/21 1009  HGB 12.2* 12.3* 11.3*     While in ER: Clinical Course as of 03/12/21 1858  Wed Mar 12, 2021  1047 Chest x-ray personally reviewed with increased markings noted in right middle lobe and right lower lobe [DR]  1048 Radiologist interpretation reviewed noted patchy infiltrates [DR]  1101 First lactic acid normal Chest x-ray with infiltrates noted Zithromax and Rocephin ordered [DR]    Clinical Course User Index [DR] Pattricia Boss, MD         CXR - Patchy interstitial opacities bilaterally suspicious for atypical/viral pneumonia.   Following Medications were ordered in ER: Medications  lactated ringers bolus 500 mL (0 mLs Intravenous Stopped 03/12/21 1120)  acetaminophen (TYLENOL) tablet 650 mg (650 mg Oral Given 03/12/21 1030)  cefTRIAXone (ROCEPHIN) 1 g in sodium chloride 0.9 % 100  mL IVPB (0 g Intravenous Stopped 03/12/21 1141)  azithromycin (ZITHROMAX) 500 mg in sodium chloride 0.9 % 250 mL IVPB (0 mg Intravenous Stopped 03/12/21 1214)       ED Triage Vitals  Enc Vitals Group     BP 03/12/21 0958 (!) 151/81     Pulse Rate 03/12/21 0958 (!) 115     Resp 03/12/21 0958 (!) 24     Temp 03/12/21 0958 (!) 100.5 F (38.1 C)     Temp Source 03/12/21 0958 Oral     SpO2 03/12/21 0958 91 %     Weight 03/12/21 1001 179 lb (81.2 kg)     Height 03/12/21 1001 '6\' 2"'  (1.88 m)     Head Circumference --      Peak Flow --      Pain Score 03/12/21 1001 5     Pain Loc --      Pain Edu? --      Excl. in [place]? --   TMAX(24)@     _________________________________________ Significant initial  Findings: Abnormal Labs Reviewed  COMPREHENSIVE METABOLIC PANEL - Abnormal; Notable for the following components:      Result Value    Chloride 97 (*)    Glucose, Bld 154 (*)    BUN 41 (*)    Creatinine, Ser 1.63 (*)    AST 52 (*)    Total Bilirubin 1.3 (*)    GFR, Estimated 42 (*)    All other components within normal limits  CBC WITH DIFFERENTIAL/PLATELET - Abnormal; Notable for the following components:   WBC 13.5 (*)    RBC 3.82 (*)    Hemoglobin 11.3 (*)    HCT 35.3 (*)    Neutro Abs 11.2 (*)    Monocytes Absolute 1.2 (*)    Abs Immature Granulocytes 0.09 (*)    All other components within normal limits  URINALYSIS, ROUTINE W REFLEX MICROSCOPIC - Abnormal; Notable for the following components:   APPearance HAZY (*)    Glucose, UA >1,000 (*)    Hgb urine dipstick LARGE (*)    Ketones, ur 15 (*)    Protein, ur 100 (*)    Bacteria, UA FEW (*)    All other components within normal limits  CBG MONITORING, ED - Abnormal; Notable for the following components:   Glucose-Capillary 171 (*)    All other components within normal limits       ECG: Ordered Personally reviewed by me showing: HR : 113 Rhythm:  Sinus tachycardia    no evidence of ischemic changes QTC 439   ____________________ This patient meets SIRS Criteria and may be septic.   The recent clinical data is shown below. Vitals:   03/12/21 1530 03/12/21 1630 03/12/21 1700 03/12/21 1835  BP: 124/69 (!) 142/63 (!) 150/69 139/77  Pulse: (!) 101 96 (!) 101 94  Resp: (!) 22 (!) '22 18 19  ' Temp:    99.2 F (37.3 C)  TempSrc:    Oral  SpO2: 99% 97% 97% 99%  Weight:      Height:        WBC     Component Value Date/Time   WBC 13.5 (H) 03/12/2021 1009   LYMPHSABS 1.0 03/12/2021 1009   MONOABS 1.2 (H) 03/12/2021 1009   EOSABS 0.0 03/12/2021 1009   BASOSABS 0.0 03/12/2021 1009     Lactic Acid, Venous    Component Value Date/Time   LATICACIDVEN 1.4 03/12/2021 1009     Procalcitonin 10  UA  no evidence of UTI      Urine analysis:    Component Value Date/Time   COLORURINE YELLOW 03/12/2021 1009   APPEARANCEUR HAZY (A) 03/12/2021  1009   LABSPEC 1.016 03/12/2021 1009   PHURINE 5.5 03/12/2021 1009   GLUCOSEU >1,000 (A) 03/12/2021 1009   HGBUR LARGE (A) 03/12/2021 1009   BILIRUBINUR NEGATIVE 03/12/2021 1009   KETONESUR 15 (A) 03/12/2021 1009   PROTEINUR 100 (A) 03/12/2021 1009   NITRITE NEGATIVE 03/12/2021 1009   LEUKOCYTESUR NEGATIVE 03/12/2021 1009    Results for orders placed or performed during the hospital encounter of 03/12/21  Resp Panel by RT-PCR (Flu A&B, Covid) Nasopharyngeal Swab     Status: None   Collection Time: 03/12/21 10:09 AM   Specimen: Nasopharyngeal Swab; Nasopharyngeal(NP) swabs in vial transport medium  Result Value Ref Range Status   SARS Coronavirus 2 by RT PCR NEGATIVE NEGATIVE Final         Influenza A by PCR NEGATIVE NEGATIVE Final   Influenza B by PCR NEGATIVE NEGATIVE Final          _______________________________________________ Hospitalist was called for admission for CAP, dehydration and sepsis  The following Work up has been ordered so far:  Orders Placed This Encounter  Procedures   Resp Panel by RT-PCR (Flu A&B, Covid) Nasopharyngeal Swab   Culture, blood (Routine x 2)   Respiratory (~20 pathogens) panel by PCR   DG Chest Port 1 View   Comprehensive metabolic panel   CBC with Differential   Protime-INR   Urinalysis, Routine w reflex microscopic   Lipase, blood   Notify physician (specify)   Document height and weight   Cardiac monitoring   Care order/instruction: Upon arrival to [facility], nursing staff will need to notify PATIENT PLACEMENT that the patient has arrived. The flow manager will contact [place] to assign patient to the appropriate physician. Thank you.   Consult to hospitalist   Droplet precaution   CBG monitoring, ED   Place in observation (patient's expected length of stay will be less than 2 midnights)     OTHER Significant initial  Findings:  labs showing:    Recent Labs  Lab 03/12/21 1009  NA 136  K 3.9  CO2 24  GLUCOSE 154*  BUN 41*   CREATININE 1.63*  CALCIUM 9.3    Cr    Up from baseline see below Lab Results  Component Value Date   CREATININE 1.63 (H) 03/12/2021   CREATININE 1.38 (H) 10/03/2020   CREATININE 1.38 (H) 10/01/2020    Recent Labs  Lab 03/12/21 1009  AST 52*  ALT 22  ALKPHOS 75  BILITOT 1.3*  PROT 7.5  ALBUMIN 3.9   Lab Results  Component Value Date   CALCIUM 9.3 03/12/2021          Plt: Lab Results  Component Value Date   PLT 205 03/12/2021       COVID-19 Labs  No results for input(s): DDIMER, FERRITIN, LDH, CRP in the last 72 hours.  Lab Results  Component Value Date   SARSCOV2NAA NEGATIVE 03/12/2021   SARSCOV2NAA NEGATIVE 09/12/2020   SARSCOV2NAA NEGATIVE 08/05/2020   [place] NEGATIVE 09/05/2019        Recent Labs  Lab 03/12/21 1009  WBC 13.5*  NEUTROABS 11.2*  HGB 11.3*  HCT 35.3*  MCV 92.4  PLT 205    HG/HCT  stable,     Component Value Date/Time   HGB 11.3 (L) 03/12/2021 1009   HCT 35.3 (  L) 03/12/2021 1009   MCV 92.4 03/12/2021 1009      Recent Labs  Lab 03/12/21 1022  LIPASE 38      Cardiac Panel (last 3 results) Recent Labs    03/12/21 1940  CKTOTAL 854*    DM  labs:  HbA1C: Recent Labs    08/05/20 0937  HGBA1C 9.5*       CBG (last 3)  Recent Labs    03/12/21 1752  GLUCAP 171*          Cultures: No results found for: SDES, [place], CULT, REPTSTATUS   Radiological Exams on Admission: DG Chest Port 1 View  Result Date: 03/12/2021 CLINICAL DATA:  Suspected Sepsis EXAM: PORTABLE CHEST 1 VIEW COMPARISON:  May 2022 FINDINGS: Patchy interstitial opacities bilaterally. No pleural effusion or pneumothorax. Normal heart size. IMPRESSION: Patchy interstitial opacities bilaterally suspicious for atypical/viral pneumonia. Electronically Signed   By: Macy Mis M.D.   On: 03/12/2021 10:38   _______________________________________________________________________________________________________ Latest   Blood pressure  139/77, pulse 94, temperature 99.2 F (37.3 C), temperature source Oral, resp. rate 19, height '6\' 2"'  (1.88 m), weight 81.2 kg, SpO2 99 %.   Vitals  labs and radiology finding personally reviewed  Review of Systems:    Pertinent positives include:   Fevers, chills, fatigue,  shortness of breath at rest. productive cough,   Constitutional:  No weight loss, night sweats,weight loss  HEENT:  No headaches, Difficulty swallowing,Tooth/dental problems,Sore throat,  No sneezing, itching, ear ache, nasal congestion, post nasal drip,  Cardio-vascular:  No chest pain, Orthopnea, PND, anasarca, dizziness, palpitations.no Bilateral lower extremity swelling  GI:  No heartburn, indigestion, abdominal pain, nausea, vomiting, diarrhea, change in bowel habits, loss of appetite, melena, blood in stool, hematemesis Resp:   No dyspnea on exertion, No excess mucus, no No non-productive cough, No coughing up of blood.No change in color of mucus.No wheezing. Skin:  no rash or lesions. No jaundice GU:  no dysuria, change in color of urine, no urgency or frequency. No straining to urinate.  No flank pain.  Musculoskeletal:  No joint pain or no joint swelling. No decreased range of motion. No back pain.  Psych:  No change in mood or affect. No depression or anxiety. No memory loss.  Neuro: no localizing neurological complaints, no tingling, no weakness, no double vision, no gait abnormality, no slurred speech, no confusion  All systems reviewed and apart from [place] all are negative _______________________________________________________________________________________________ Past Medical History:   Past Medical History:  Diagnosis Date   Anemia    as an infant   Arthritis    Back pain    Depression    Disc displacement, lumbar    Gynecomastia    History of kidney stones    Hyperlipidemia    Hypertension    no longer on medications   Hypertriglyceridemia    Insomnia    Low back pain    Lumbar  radiculopathy    Lumbar stenosis    OA (osteoarthritis)    Polyneuropathy in diabetes(357.2)    Prostate cancer ([place]) 2003   Restless legs    Ringing in ears    RLS (restless legs syndrome)    Type 2 diabetes mellitus ([place])       Past Surgical History:  Procedure Laterality Date   ABDOMINAL EXPOSURE N/A 07/13/2019   Procedure: ABDOMINAL EXPOSURE;  Surgeon: Serafina [place], MD;  Location: [place];  Service: Vascular;  Laterality: N/A;  anterior approach   ANTERIOR LAT LUMBAR FUSION  Right 07/13/2019   Procedure: Right Lumbar 3-4 Lumbar 4-5 Anterolateral lumbar interbody fusion;  Surgeon: Erline Levine, MD;  Location: [place];  Service: Neurosurgery;  Laterality: Right;   ANTERIOR LUMBAR FUSION N/A 07/13/2019   Procedure: Lumbar Five-Sacral One Anterior lumbar interbody fusion;  Surgeon: Erline Levine, MD;  Location: [place];  Service: Neurosurgery;  Laterality: N/A;  Anterior approach   CHOLECYSTECTOMY     COLONOSCOPY     CORONARY STENT INTERVENTION N/A 10/01/2020   Procedure: CORONARY STENT INTERVENTION;  Surgeon: Nigel Mormon, MD;  Location: [place] [facility];  Service: Cardiovascular;  Laterality: N/A;   EYE SURGERY Bilateral    GALLBLADDER SURGERY     HEMORRHOID SURGERY  1983   LEFT HEART CATH AND CORONARY ANGIOGRAPHY N/A 09/17/2020   Procedure: LEFT HEART CATH AND CORONARY ANGIOGRAPHY;  Surgeon: Nigel Mormon, MD;  Location: [place] [facility];  Service: Cardiovascular;  Laterality: N/A;   LUMBAR LAMINECTOMY/DECOMPRESSION MICRODISCECTOMY N/A 10/02/2016   Procedure: L3 to S1 Laminectomy;  Surgeon: Erline Levine, MD;  Location: [place];  Service: Neurosurgery;  Laterality: N/A;  L3 to S1 Laminectomy   LUMBAR LAMINECTOMY/DECOMPRESSION MICRODISCECTOMY Bilateral 09/07/2019   Procedure: Bilateral Lumbar Five - Sacral One Foraminotomy;  Surgeon: Erline Levine, MD;  Location: [place];  Service: Neurosurgery;  Laterality: Bilateral;  posterior   LUMBAR PERCUTANEOUS PEDICLE SCREW 3 LEVEL N/A  07/13/2019   Procedure: Percutaneous pedicle screw fixation from Lumbar 3 to Sacral 1;  Surgeon: Erline Levine, MD;  Location: [place];  Service: Neurosurgery;  Laterality: N/A;   MENISCUS REPAIR Right    PROSTATE SURGERY     SHOULDER SURGERY Left 01/2011   rotator cuff    Social History:  Ambulatory   cane,      reports that he quit smoking about a year ago. His smoking use included cigars. He has never used smokeless tobacco. He reports that he does not currently use alcohol. He reports that he does not use drugs.     Family History:   Family History  Problem Relation Age of Onset   Arthritis Mother    Hypertension Mother    Diabetes Mother    Heart attack Mother    Cancer Father    ______________________________________________________________________________________________ Allergies: Allergies  Allergen Reactions   Levaquin [Levofloxacin In D5w] Rash   Niaspan [Niacin Er] Rash and Other (See Comments)    Flushing reaction   Tramadol Rash     Prior to Admission medications   Medication Sig Start Date End Date Taking? Authorizing Provider  clopidogrel (PLAVIX) 75 MG tablet Take 1 tablet (75 mg total) by mouth daily. 08/08/20  Yes Patwardhan, [name], MD  Dulaglutide (TRULICITY) 1.5 WC/5.8NI SOPN Inject 1.5 mg into the skin every Wednesday.   Yes [provider]  DULoxetine (CYMBALTA) 30 MG capsule Take 30 mg by mouth daily. 11/25/20  Yes [provider]  FARXIGA 10 MG TABS tablet Take 10 mg by mouth daily.  05/23/19  Yes [provider]  gabapentin (NEURONTIN) 600 MG tablet Take 600 mg by mouth 2 (two) times daily.   Yes [provider]  metFORMIN (GLUCOPHAGE) 500 MG tablet Take 2 tablets (1,000 mg total) by mouth 2 (two) times daily with a meal. Resume 10/03/2020 10/03/20  Yes Patwardhan, Manish J, MD  metoprolol succinate (TOPROL-XL) 25 MG 24 hr tablet TAKE 1 TABLET(25 MG) BY MOUTH DAILY 09/03/20  Yes Patwardhan, Manish J, MD   polyethylene glycol powder (GLYCOLAX/MIRALAX) 17 GM/SCOOP powder See admin  instructions.   Yes [provider]  pregabalin (LYRICA) 75 MG capsule Take 75 mg by mouth 3 (three) times daily. 08/09/20  Yes [provider]  rOPINIRole (REQUIP) 4 MG tablet Take 4 mg by mouth at bedtime.   Yes [provider]  rosuvastatin (CRESTOR) 40 MG tablet TAKE 1 TABLET(40 MG) BY MOUTH DAILY 02/12/21  Yes Patwardhan, Manish J, MD  vitamin B-12 (CYANOCOBALAMIN) 500 MCG tablet Take 500 mcg by mouth daily.   Yes [provider]  cilostazol (PLETAL) 100 MG tablet Take 1 tablet (100 mg total) by mouth 2 (two) times daily before a meal. Patient not taking: Reported on 03/12/2021 11/19/20   Vaughan Basta, Edman Circle, PA-C  nitroGLYCERIN (NITROSTAT) 0.4 MG SL tablet Place 1 tablet (0.4 mg total) under the tongue every 5 (five) minutes as needed for chest pain. 09/02/20 12/13/20  Nigel Mormon, MD    ___________________________________________________________________________________________________ Physical Exam: Vitals with BMI 03/12/2021 03/12/2021 03/12/2021  Height - - -  Weight - - -  BMI - - -  Systolic [phone]  Diastolic 77 69 63  Pulse 94 101 96     1. General:  in No  Acute distress    Chronically ill   -appearing 2. Psychological: Alert and   Oriented 3. Head/ENT:    Dry Mucous Membranes                          Head Non traumatic, neck supple                            Poor Dentition 4. SKIN:  decreased Skin turgor,  Skin clean Dry and intact no rash 5. Heart: Regular rate and rhythm    Murmur, no Rub or gallop 6. Lungs:  no wheezes some crackles   7. Abdomen: Soft,  non-tender, Non distended  bowel sounds present 8. Lower extremities: no clubbing, cyanosis, no  edema 9. Neurologically Grossly intact, moving all 4 extremities equally   10. MSK: Normal range of motion    Chart has been  reviewed  ______________________________________________________________________________________________  Assessment/Plan  80 y.o. male with medical history significant of anemia, HLD, HTN DM2, RLS  Admitted for  CAP, sepsis  Present on Admission:  PNA (pneumonia)  PVD (peripheral vascular disease) ([place])  Coronary artery disease of native artery of native heart with stable angina pectoris (HCC)  Restless legs  Acute respiratory failure with hypoxia (HCC)  Sepsis ([place])  Diabetic polyneuropathy ([place])  Acute renal failure 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)  Elevated CK     PVD (peripheral vascular disease) (HCC) Cont Pletal and Plavix  Type 2 diabetes mellitus (HCC)  - Order Sensitive  SSI     -  check TSH and HgA1C      Coronary artery disease of native artery of native heart with stable angina pectoris (HCC)  - chronic, continue aspiri   and beta blocker Plavix Hold  statin  Acute respiratory failure with hypoxia ([place])  this patient has acute respiratory failure with Hypoxia as documented by the presence of following: O2 saturatio< 90% on RA Likely due to:  Pneumonia Provide O2 therapy and titrate as needed  Continuous pulse ox   check Pulse ox with ambulation prior to discharge  may need  TC consult for home O2 set up    flutter valve ordered   PNA (pneumonia)  - -Patient  presenting with productive cough, fever   and infiltrate   on chest x-ray -Infiltrate on CXR and 2-3 characteristics (fever, leukocytosis, purulent sputum) are consistent with pneumonia. -This appears to be most likely community-acquired pneumonia.   will admit for treatment of CAP will start on appropriate antibiotic coverage. - Rocephin/azithromycin   Obtain:  sputum cultures,                 Obtain respiratory panel                   influenza serologies negative                  COVID PCR negative                    blood cultures and sputum cultures ordered                    strep pneumo UA antigen,                   check for Legionella antigen.                Provide oxygen as needed.     Sepsis ([place])  -SIRS criteria met with  elevated white blood cell count,       Component Value Date/Time   WBC 13.5 (H) 03/12/2021 1009   LYMPHSABS 1.0 03/12/2021 1009     tachycardia   ,  Fever  RR >20 Today's Vitals   03/12/21 2147 03/13/21 0000 03/13/21 0155 03/13/21 0159  BP: 126/63   129/67  Pulse: 100   (!) 106  Resp: 16   16  Temp: 98.1 F (36.7 C)   97.9 F (36.6 C)  TempSrc: Oral   Oral  SpO2: 100%   95%  Weight:      Height:      PainSc: 0-No pain 0-No pain 0-No pain    Body mass index is 22.98 kg/m.  This patient meets SIRS Criteria and may be septic   The recent clinical data is shown below. Vitals:   03/12/21 1700 03/12/21 1835 03/12/21 2147 03/13/21 0159  BP: (!) 150/69 139/77 126/63 129/67  Pulse: (!) 101 94 100 (!) 106  Resp: '18 19 16 16  ' Temp:  99.2 F (37.3 C) 98.1 F (36.7 C) 97.9 F (36.6 C)  TempSrc:  Oral Oral Oral  SpO2: 97% 99% 100% 95%  Weight:      Height:         -Most likely source being  Pulmonary,    Patient meeting criteria for Severe sepsis with    evidence of end organ damage/organ dysfunction such as     Acute hypoxia requiring new supplemental oxygen, SpO2: 95 % O2 Flow Rate (L/min): 2 L/min      - Obtain serial lactic acid and procalcitonin level.  - Initiated IV antibiotics in ER: Antibiotics Given (last 72 hours)     Date/Time Action Medication Dose Rate   03/12/21 1111 New Bag/Given   cefTRIAXone (ROCEPHIN) 1 g in sodium chloride 0.9 % 100 mL IVPB 1 g 200 mL/hr   03/12/21 1114 New Bag/Given   azithromycin (ZITHROMAX) 500 mg in sodium chloride 0.9 % 250 mL IVPB 500 mg 250 mL/hr       Will continue     - await results of blood and urine culture  - Rehydrate aggressively  Intravenous fluids were administered      3:24  AM   Restless legs Resume home meds  Elevated CK Rehydrate  and recheck in am  Acute renal failure superimposed on stage 3a chronic kidney disease ([place]) -  evidence of acute renal failure due to presence of following: Cr increased >0.3 from baseline      check FeNA       Rehydrate with IV fluids  History does not suggest urinary retention or obstruction     Other plan as per orders.  DVT prophylaxis:  SCD     Code Status:    Code Status: Prior FULL CODE  as per patient   I had personally discussed CODE STATUS with patient      Family Communication:   Family not at  Bedside    Disposition Plan:    To home once workup is complete and patient is stable   Following barriers for discharge:                                                          Afebrile, white count improving able to transition to PO antibiotics                             Will need to be able to tolerate PO                            Will likely need home health, home O2, set up                          Consults called: none  Admission status:  ED Disposition     ED Disposition  Admit   Condition  --   [place]: [place] [[id number]]  Level of Care: Telemetry [5]  Admit to tele based on following criteria: Monitor for Ischemic changes  Interfacility transfer: Yes  May place patient in observation at [facility] or [place] if equivalent level of care is available:: No  Covid Evaluation: Confirmed COVID Negative  Diagnosis: PNA (pneumonia) [[id number]]  Admitting Physician: Jonnie Finner [[id number]]  Attending Physician: Pattricia Boss [[id number]]           Obs     Level of care     tele      Lab Results  Component Value Date   [place] NEGATIVE 03/12/2021     Precautions: admitted as   Covid Negative   Alisse Tuite 03/13/2021, 3:37 AM    [facility]     after 2 AM please page floor coverage PA If 7AM-7PM, please contact the day team taking care of the patient using [email or web address]   Patient was  evaluated in the context of the global COVID-19 pandemic, which necessitated consideration that the patient might be at risk for infection with the SARS-CoV-2 virus that causes COVID-19. Institutional protocols and algorithms that pertain to the evaluation of patients at risk for COVID-19 are in a state of rapid change based on information released by regulatory bodies including the CDC and federal and state organizations. These policies and algorithms were followed during the patient's care.

## 2021-03-12 NOTE — ED Triage Notes (Signed)
Sunday developed N/V, weakness, cough and dizziness. At times productive cough, Pt has not been able to keep foods down but can keep his medicine down. Temp 100.5 in triage

## 2021-03-12 NOTE — Plan of Care (Signed)
  Problem: Education: Goal: Knowledge of General Education information will improve Description Including pain rating scale, medication(s)/side effects and non-pharmacologic comfort measures Outcome: Progressing   

## 2021-03-12 NOTE — Assessment & Plan Note (Signed)
Cont Pletal and Plavix

## 2021-03-12 NOTE — Assessment & Plan Note (Addendum)
-   Order Sensitive SSI  °  ° -  check TSH and HgA1C °  ° ° °

## 2021-03-12 NOTE — Assessment & Plan Note (Addendum)
-   chronic, continue aspiri   and beta blocker Plavix Hold  statin

## 2021-03-12 NOTE — Assessment & Plan Note (Signed)
this patient has acute respiratory failure with Hypoxia   as documented by the presence of following: O2 saturatio< 90% on RA   Likely due to:   Pneumonia  Provide O2 therapy and titrate as needed  Continuous pulse ox   check Pulse ox with ambulation prior to discharge   may need  TC consult for home O2 set up    flutter valve ordered  

## 2021-03-12 NOTE — ED Provider Notes (Signed)
[facility] EMERGENCY DEPT Provider Note   CSN: [id number] Arrival date & time: 03/12/21  8110     History Chief Complaint  Patient presents with   Nausea   Dizziness   Weakness   Cough   Urinary Frequency    Harry Zamora is a 80 y.o. male.  HPI 80 year old male presents today from home with his wife with complaints of nasal congestion, cough, generalized weakness, fever, chills that began 3 days ago.  He has had COVID and flu vaccine this fall.  He has been nauseated and has had vomiting.  He has been able to keep his medications down.  His temperature here is 100.5.    Past Medical History:  Diagnosis Date   Anemia    as an infant   Arthritis    Back pain    Depression    Disc displacement, lumbar    Gynecomastia    History of kidney stones    Hyperlipidemia    Hypertension    no longer on medications   Hypertriglyceridemia    Insomnia    Low back pain    Lumbar radiculopathy    Lumbar stenosis    OA (osteoarthritis)    Polyneuropathy in diabetes(357.2)    Prostate cancer ([place]) 2003   Restless legs    Ringing in ears    RLS (restless legs syndrome)    Type 2 diabetes mellitus ([place])     Patient Active Problem List   Diagnosis Date Noted   PNA (pneumonia) 03/12/2021   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   Abnormal stress test 09/16/2020   Hyperkalemia 09/12/2020   Acute renal failure superimposed on stage 3a chronic kidney disease ([place]) 09/12/2020   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   Type 2 diabetes mellitus ([place])    Syncope and collapse 08/07/2020   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   PVD (peripheral vascular disease) ([place]) 08/30/2019   Spondylolisthesis of lumbar region 07/13/2019   Spinal stenosis of lumbar region with neurogenic claudication 10/02/2016   Polyneuropathy in diabetes(357.2) 11/10/2013    Past Surgical History:  Procedure  Laterality Date   ABDOMINAL EXPOSURE N/A 07/13/2019   Procedure: ABDOMINAL EXPOSURE;  Surgeon: Serafina Mitchell, MD;  Location: [facility];  Service: Vascular;  Laterality: N/A;  anterior approach   ANTERIOR LAT LUMBAR FUSION Right 07/13/2019   Procedure: Right Lumbar 3-4 Lumbar 4-5 Anterolateral lumbar interbody fusion;  Surgeon: Erline Levine, MD;  Location: [facility];  Service: Neurosurgery;  Laterality: Right;   ANTERIOR LUMBAR FUSION N/A 07/13/2019   Procedure: Lumbar Five-Sacral One Anterior lumbar interbody fusion;  Surgeon: Erline Levine, MD;  Location: [facility];  Service: Neurosurgery;  Laterality: N/A;  Anterior approach   CHOLECYSTECTOMY     COLONOSCOPY     CORONARY STENT INTERVENTION N/A 10/01/2020   Procedure: CORONARY STENT INTERVENTION;  Surgeon: Nigel Mormon, MD;  Location: [facility];  Service: Cardiovascular;  Laterality: N/A;   EYE SURGERY Bilateral    GALLBLADDER SURGERY     HEMORRHOID SURGERY  1983   LEFT HEART CATH AND CORONARY ANGIOGRAPHY N/A 09/17/2020   Procedure: LEFT HEART CATH AND CORONARY ANGIOGRAPHY;  Surgeon: Nigel Mormon, MD;  Location: [facility];  Service: Cardiovascular;  Laterality: N/A;   LUMBAR LAMINECTOMY/DECOMPRESSION MICRODISCECTOMY N/A 10/02/2016   Procedure: L3 to S1 Laminectomy;  Surgeon: Erline Levine, MD;  Location: [facility];  Service: Neurosurgery;  Laterality: N/A;  L3 to S1 Laminectomy   LUMBAR LAMINECTOMY/DECOMPRESSION MICRODISCECTOMY Bilateral 09/07/2019   Procedure: Bilateral Lumbar Five - Sacral One Foraminotomy;  Surgeon: Erline Levine, MD;  Location: [facility];  Service: Neurosurgery;  Laterality: Bilateral;  posterior   LUMBAR PERCUTANEOUS PEDICLE SCREW 3 LEVEL N/A 07/13/2019   Procedure: Percutaneous pedicle screw fixation from Lumbar 3 to Sacral 1;  Surgeon: Erline Levine, MD;  Location: [facility];  Service: Neurosurgery;  Laterality: N/A;   MENISCUS REPAIR Right    PROSTATE SURGERY     SHOULDER SURGERY Left 01/2011   rotator cuff        Family History  Problem Relation Age of Onset   Arthritis Mother    Hypertension Mother    Diabetes Mother    Heart attack Mother    Cancer Father     Social History   Tobacco Use   Smoking status: Former    Types: Cigars    Quit date: 2022    Years since quitting: 0.9   Smokeless tobacco: Never   Tobacco comments:    occasional  Vaping Use   Vaping Use: Never used  Substance Use Topics   Alcohol use: Not Currently    Comment: social-beer   Drug use: No    Home Medications Prior to Admission medications   Medication Sig Start Date End Date Taking? Authorizing Provider  cilostazol (PLETAL) 100 MG tablet Take 1 tablet (100 mg total) by mouth 2 (two) times daily before a meal. 11/19/20   Setzer, Edman Circle, PA-C  clopidogrel (PLAVIX) 75 MG tablet Take 1 tablet (75 mg total) by mouth daily. 08/08/20   Patwardhan, [name], MD  Dulaglutide (TRULICITY) 1.5 AL/9.3XT SOPN Inject 1.5 mg into the skin every Wednesday.    [provider]  DULoxetine (CYMBALTA) 30 MG capsule Take 30 mg by mouth daily. 11/25/20   [provider]  FARXIGA 10 MG TABS tablet Take 10 mg by mouth daily.  05/23/19   [provider]  gabapentin (NEURONTIN) 600 MG tablet Take 600 mg by mouth 2 (two) times daily.    [provider]  metFORMIN (GLUCOPHAGE) 500 MG tablet Take 2 tablets (1,000 mg total) by mouth 2 (two) times daily with a meal. Resume 10/03/2020 10/03/20   Patwardhan, [name], MD  metoprolol succinate (TOPROL-XL) 25 MG 24 hr tablet TAKE 1 TABLET(25 MG) BY MOUTH DAILY 09/03/20   Patwardhan, Manish J, MD  nitroGLYCERIN (NITROSTAT) 0.4 MG SL tablet Place 1 tablet (0.4 mg total) under the tongue every 5 (five) minutes as needed for chest pain. 09/02/20 12/13/20  Patwardhan, [name], MD  polyethylene glycol powder (GLYCOLAX/MIRALAX) 17 GM/SCOOP powder See admin instructions.    [provider]  pregabalin (LYRICA) 75 MG capsule Take 75 mg by mouth 3 (three)  times daily. 08/09/20   [provider]  rOPINIRole (REQUIP) 4 MG tablet Take 4 mg by mouth at bedtime.    [provider]  rosuvastatin (CRESTOR) 40 MG tablet TAKE 1 TABLET(40 MG) BY MOUTH DAILY 02/12/21   Patwardhan, Manish J, MD  traMADol (ULTRAM) 50 MG tablet Take by mouth as needed. 10/01/20   [provider]  vitamin B-12 (CYANOCOBALAMIN) 500 MCG tablet Take 500 mcg by mouth daily.    [provider]    Allergies    Levaquin [levofloxacin in d5w], Niaspan [niacin er], and Tramadol  Review of Systems   Review of Systems  All other systems reviewed and are negative.  Physical Exam Updated Vital Signs BP  135/73    Pulse (!) 110    Temp 99.8 F (37.7 C)    Resp (!) 26    Ht 1.88 m (6\' 2" )    Wt 81.2 kg    SpO2 96%    BMI 22.98 kg/m   Physical Exam Vitals and nursing note reviewed.  Constitutional:      General: He is not in acute distress.    Appearance: Normal appearance. He is not ill-appearing.  HENT:     Head: Normocephalic.     Right Ear: External ear normal.     Left Ear: External ear normal.     Nose: Nose normal.     Mouth/Throat:     Mouth: Mucous membranes are moist.  Eyes:     Pupils: Pupils are equal, round, and reactive to light.  Cardiovascular:     Rate and Rhythm: Regular rhythm. Tachycardia present.     Pulses: Normal pulses.  Pulmonary:     Effort: Pulmonary effort is normal.     Breath sounds: Rhonchi and rales present.     Comments: Rhonchi and rales right base Abdominal:     General: Abdomen is flat. There is distension.     Tenderness: There is abdominal tenderness.     Comments: Diffuse right-sided tenderness to palpation  Musculoskeletal:        General: No swelling. Normal range of motion.     Cervical back: Normal range of motion.  Skin:    General: Skin is warm and dry.     Capillary Refill: Capillary refill takes less than 2 seconds.     Findings: No rash.  Neurological:     General: No focal deficit  present.     Mental Status: He is alert.  Psychiatric:        Mood and Affect: Mood normal.    ED Results / Procedures / Treatments   Labs (all labs ordered are listed, but only abnormal results are displayed) Labs Reviewed  COMPREHENSIVE METABOLIC PANEL - Abnormal; Notable for the following components:      Result Value   Chloride 97 (*)    Glucose, Bld 154 (*)    BUN 41 (*)    Creatinine, Ser 1.63 (*)    AST 52 (*)    Total Bilirubin 1.3 (*)    GFR, Estimated 42 (*)    All other components within normal limits  CBC WITH DIFFERENTIAL/PLATELET - Abnormal; Notable for the following components:   WBC 13.5 (*)    RBC 3.82 (*)    Hemoglobin 11.3 (*)    HCT 35.3 (*)    Neutro Abs 11.2 (*)    Monocytes Absolute 1.2 (*)    Abs Immature Granulocytes 0.09 (*)    All other components within normal limits  URINALYSIS, ROUTINE W REFLEX MICROSCOPIC - Abnormal; Notable for the following components:   APPearance HAZY (*)    Glucose, UA >1,000 (*)    Hgb urine dipstick LARGE (*)    Ketones, ur 15 (*)    Protein, ur 100 (*)    Bacteria, UA FEW (*)    All other components within normal limits  RESP PANEL BY RT-PCR (FLU A&B, COVID) ARPGX2  CULTURE, BLOOD (ROUTINE X 2)  CULTURE, BLOOD (ROUTINE X 2)  LACTIC ACID, PLASMA  PROTIME-INR  LIPASE, BLOOD  LACTIC ACID, PLASMA    EKG EKG Interpretation  Date/Time:  Wednesday March 12 2021 09:57:50 EST Ventricular Rate:  113 PR Interval:    QRS Duration:  90 QT Interval:  320 QTC Calculation: 439 R Axis:   81 Text Interpretation: Sinus tachycardia Ventricular premature complex Borderline right axis deviation Anteroseptal infarct, old Nonspecific T abnormalities, lateral leads Confirmed by Pattricia Boss [phone]) on 03/12/2021 12:18:34 PM  Radiology DG Chest Port 1 View  Result Date: 03/12/2021 CLINICAL DATA:  Suspected Sepsis EXAM: PORTABLE CHEST 1 VIEW COMPARISON:  May 2022 FINDINGS: Patchy interstitial opacities bilaterally. No  pleural effusion or pneumothorax. Normal heart size. IMPRESSION: Patchy interstitial opacities bilaterally suspicious for atypical/viral pneumonia. Electronically Signed   By: Macy Mis M.D.   On: 03/12/2021 10:38    Procedures Procedures   Medications Ordered in ED Medications  lactated ringers bolus 500 mL (0 mLs Intravenous Stopped 03/12/21 1120)  acetaminophen (TYLENOL) tablet 650 mg (650 mg Oral Given 03/12/21 1030)  cefTRIAXone (ROCEPHIN) 1 g in sodium chloride 0.9 % 100 mL IVPB (1 g Intravenous New Bag/Given 03/12/21 1111)  azithromycin (ZITHROMAX) 500 mg in sodium chloride 0.9 % 250 mL IVPB (500 mg Intravenous New Bag/Given 03/12/21 1114)    ED Course  I have reviewed the triage vital signs and the nursing notes.  Pertinent labs & imaging results that were available during my care of the patient were reviewed by me and considered in my medical decision making (see chart for details).  Clinical Course as of 03/12/21 1223  Wed Mar 12, 2021  1047 Chest x-Bisma Klett personally reviewed with increased markings noted in right middle lobe and right lower lobe [DR]  1048 Radiologist interpretation reviewed noted patchy infiltrates [DR]  1101 First lactic acid normal Chest x-Veer Elamin with infiltrates noted Zithromax and Rocephin ordered [DR]    Clinical Course User Index [DR] Pattricia Boss, MD   MDM Rules/Calculators/A&P                           Medical Decision Making   80 year old man who presented today with cough, congestion, and fever. Imaging, labs, lactic acid, ordered to evaluate for infection with likely bacterial versus viral.  COVID and flu test ordered.  EKG ordered due to patient's age and cough with dyspnea. Interventions patient required IV fluids, IV antibiotics, monitoring which has continued with sinus tachycardia, and oxygen.  He is currently has a new oxygen requirement of 2 L Reviewed and interpreted with leukocytosis at 13,500, and mild anemia at 11.3, normal  platelets at 205,000. Electrolytes ordered with mild hypochloremia, normal sodium, normal potassium. Blood glucose elevated at 154 with patient with chronic underlying diabetes. Patient's BUN and creatinine elevated at 41 and 1.63 with first prior creatinine at 1.1. Chest x-Jolynn Bajorek personally reviewed and radiology interpretation reviewed with s chief infiltrates COVID test and flu test reviewed and are negative Patient with illness and tachycardia included sepsis in differential.  Patient has had normal blood pressure and lactic acid is normal making current sepsis diagnosis unlikely Patient with cough and dyspnea with EKG reviewed without any evidence of acute ischemia although he is mildly tachycardic consistent with Care discussed with patient and wife Care discussed with Dr. Marylyn Ishihara, on-call for hospitalist and [facility] to [facility]  Final Clinical Impression(s) / ED Diagnoses Final diagnoses:  Community acquired pneumonia, unspecified laterality  AKI (acute kidney injury) [facility])    Rx / [place] Orders ED Discharge Orders     None        Pattricia Boss, MD 03/12/21 1223

## 2021-03-12 NOTE — Subjective & Objective (Signed)
several days of fevers, chills, cough

## 2021-03-13 DIAGNOSIS — J189 Pneumonia, unspecified organism: Secondary | ICD-10-CM | POA: Diagnosis present

## 2021-03-13 DIAGNOSIS — N179 Acute kidney failure, unspecified: Secondary | ICD-10-CM | POA: Insufficient documentation

## 2021-03-13 DIAGNOSIS — A419 Sepsis, unspecified organism: Secondary | ICD-10-CM

## 2021-03-13 DIAGNOSIS — R748 Abnormal levels of other serum enzymes: Secondary | ICD-10-CM | POA: Diagnosis present

## 2021-03-13 DIAGNOSIS — E1142 Type 2 diabetes mellitus with diabetic polyneuropathy: Secondary | ICD-10-CM | POA: Diagnosis present

## 2021-03-13 DIAGNOSIS — E1165 Type 2 diabetes mellitus with hyperglycemia: Secondary | ICD-10-CM | POA: Diagnosis present

## 2021-03-13 DIAGNOSIS — M6282 Rhabdomyolysis: Secondary | ICD-10-CM

## 2021-03-13 DIAGNOSIS — E1151 Type 2 diabetes mellitus with diabetic peripheral angiopathy without gangrene: Secondary | ICD-10-CM | POA: Diagnosis present

## 2021-03-13 DIAGNOSIS — E1122 Type 2 diabetes mellitus with diabetic chronic kidney disease: Secondary | ICD-10-CM | POA: Diagnosis present

## 2021-03-13 DIAGNOSIS — B338 Other specified viral diseases: Secondary | ICD-10-CM | POA: Diagnosis not present

## 2021-03-13 DIAGNOSIS — R296 Repeated falls: Secondary | ICD-10-CM | POA: Diagnosis present

## 2021-03-13 DIAGNOSIS — I5042 Chronic combined systolic (congestive) and diastolic (congestive) heart failure: Secondary | ICD-10-CM

## 2021-03-13 DIAGNOSIS — R059 Cough, unspecified: Secondary | ICD-10-CM | POA: Diagnosis present

## 2021-03-13 DIAGNOSIS — I25118 Atherosclerotic heart disease of native coronary artery with other forms of angina pectoris: Secondary | ICD-10-CM | POA: Diagnosis present

## 2021-03-13 DIAGNOSIS — R6520 Severe sepsis without septic shock: Secondary | ICD-10-CM | POA: Diagnosis present

## 2021-03-13 DIAGNOSIS — D72825 Bandemia: Secondary | ICD-10-CM

## 2021-03-13 DIAGNOSIS — B974 Respiratory syncytial virus as the cause of diseases classified elsewhere: Secondary | ICD-10-CM | POA: Diagnosis present

## 2021-03-13 DIAGNOSIS — E785 Hyperlipidemia, unspecified: Secondary | ICD-10-CM | POA: Diagnosis present

## 2021-03-13 DIAGNOSIS — N1831 Chronic kidney disease, stage 3a: Secondary | ICD-10-CM | POA: Diagnosis present

## 2021-03-13 DIAGNOSIS — Z20822 Contact with and (suspected) exposure to covid-19: Secondary | ICD-10-CM | POA: Diagnosis present

## 2021-03-13 DIAGNOSIS — Z87442 Personal history of urinary calculi: Secondary | ICD-10-CM | POA: Diagnosis not present

## 2021-03-13 DIAGNOSIS — F32A Depression, unspecified: Secondary | ICD-10-CM | POA: Diagnosis present

## 2021-03-13 DIAGNOSIS — J9601 Acute respiratory failure with hypoxia: Secondary | ICD-10-CM | POA: Diagnosis present

## 2021-03-13 DIAGNOSIS — I130 Hypertensive heart and chronic kidney disease with heart failure and stage 1 through stage 4 chronic kidney disease, or unspecified chronic kidney disease: Secondary | ICD-10-CM | POA: Diagnosis present

## 2021-03-13 DIAGNOSIS — R350 Frequency of micturition: Secondary | ICD-10-CM | POA: Diagnosis present

## 2021-03-13 DIAGNOSIS — G4700 Insomnia, unspecified: Secondary | ICD-10-CM | POA: Diagnosis present

## 2021-03-13 DIAGNOSIS — E781 Pure hyperglyceridemia: Secondary | ICD-10-CM | POA: Diagnosis present

## 2021-03-13 DIAGNOSIS — G2581 Restless legs syndrome: Secondary | ICD-10-CM | POA: Diagnosis present

## 2021-03-13 HISTORY — DX: Sepsis, unspecified organism: A41.9

## 2021-03-13 HISTORY — DX: Acute kidney failure, unspecified: N17.9

## 2021-03-13 LAB — GLUCOSE, CAPILLARY
Glucose-Capillary: 104 mg/dL — ABNORMAL HIGH (ref 70–99)
Glucose-Capillary: 111 mg/dL — ABNORMAL HIGH (ref 70–99)
Glucose-Capillary: 117 mg/dL — ABNORMAL HIGH (ref 70–99)
Glucose-Capillary: 198 mg/dL — ABNORMAL HIGH (ref 70–99)
Glucose-Capillary: 198 mg/dL — ABNORMAL HIGH (ref 70–99)
Glucose-Capillary: 201 mg/dL — ABNORMAL HIGH (ref 70–99)

## 2021-03-13 LAB — COMPREHENSIVE METABOLIC PANEL
ALT: 27 U/L (ref 0–44)
AST: 68 U/L — ABNORMAL HIGH (ref 15–41)
Albumin: 2.8 g/dL — ABNORMAL LOW (ref 3.5–5.0)
Alkaline Phosphatase: 70 U/L (ref 38–126)
Anion gap: 12 (ref 5–15)
BUN: 38 mg/dL — ABNORMAL HIGH (ref 8–23)
CO2: 24 mmol/L (ref 22–32)
Calcium: 8.4 mg/dL — ABNORMAL LOW (ref 8.9–10.3)
Chloride: 99 mmol/L (ref 98–111)
Creatinine, Ser: 1.5 mg/dL — ABNORMAL HIGH (ref 0.61–1.24)
GFR, Estimated: 46 mL/min — ABNORMAL LOW (ref >60)
Glucose, Bld: 124 mg/dL — ABNORMAL HIGH (ref 70–99)
Potassium: 3.8 mmol/L (ref 3.5–5.1)
Sodium: 135 mmol/L (ref 135–145)
Total Bilirubin: 1.2 mg/dL (ref 0.3–1.2)
Total Protein: 6.8 g/dL (ref 6.5–8.1)

## 2021-03-13 LAB — CBC WITH DIFFERENTIAL/PLATELET
Abs Immature Granulocytes: 0.09 10*3/uL — ABNORMAL HIGH (ref 0.00–0.07)
Basophils Absolute: 0 10*3/uL (ref 0.0–0.1)
Basophils Relative: 0 %
Eosinophils Absolute: 0 10*3/uL (ref 0.0–0.5)
Eosinophils Relative: 0 %
HCT: 29.8 % — ABNORMAL LOW (ref 39.0–52.0)
Hemoglobin: 9.9 g/dL — ABNORMAL LOW (ref 13.0–17.0)
Immature Granulocytes: 1 %
Lymphocytes Relative: 11 %
Lymphs Abs: 1 10*3/uL (ref 0.7–4.0)
MCH: 30.5 pg (ref 26.0–34.0)
MCHC: 33.2 g/dL (ref 30.0–36.0)
MCV: 91.7 fL (ref 80.0–100.0)
Monocytes Absolute: 0.9 10*3/uL (ref 0.1–1.0)
Monocytes Relative: 10 %
Neutro Abs: 6.8 10*3/uL (ref 1.7–7.7)
Neutrophils Relative %: 78 %
Platelets: 198 10*3/uL (ref 150–400)
RBC: 3.25 MIL/uL — ABNORMAL LOW (ref 4.22–5.81)
RDW: 14.2 % (ref 11.5–15.5)
WBC: 8.8 10*3/uL (ref 4.0–10.5)
nRBC: 0 % (ref 0.0–0.2)

## 2021-03-13 LAB — RESPIRATORY PANEL BY PCR

## 2021-03-13 LAB — PHOSPHORUS: Phosphorus: 2.2 mg/dL — ABNORMAL LOW (ref 2.5–4.6)

## 2021-03-13 LAB — SODIUM, URINE, RANDOM: Sodium, Ur: 26 mmol/L

## 2021-03-13 LAB — TSH: TSH: 0.274 u[IU]/mL — ABNORMAL LOW (ref 0.350–4.500)

## 2021-03-13 LAB — CK: Total CK: 1052 U/L — ABNORMAL HIGH (ref 49–397)

## 2021-03-13 LAB — HEMOGLOBIN A1C
Hgb A1c MFr Bld: 8.1 % — ABNORMAL HIGH (ref 4.8–5.6)
Mean Plasma Glucose: 185.77 mg/dL

## 2021-03-13 LAB — CREATININE, URINE, RANDOM: Creatinine, Urine: 41.99 mg/dL

## 2021-03-13 LAB — MAGNESIUM: Magnesium: 2.5 mg/dL — ABNORMAL HIGH (ref 1.7–2.4)

## 2021-03-13 LAB — PROCALCITONIN: Procalcitonin: 7.51 ng/mL

## 2021-03-13 LAB — STREP PNEUMONIAE URINARY ANTIGEN: Strep Pneumo Urinary Antigen: NEGATIVE

## 2021-03-13 MED ORDER — SODIUM CHLORIDE 0.9 % IV SOLN: INTRAVENOUS | Status: AC

## 2021-03-13 MED ORDER — DULOXETINE HCL 30 MG PO CPEP
30.0000 mg | ORAL_CAPSULE | Freq: Every day | ORAL | Status: DC
  Administered 2021-03-13 – 2021-03-14 (×2): 30 mg via ORAL
  Filled 2021-03-13 (×2): qty 1

## 2021-03-13 MED ORDER — ROPINIROLE HCL 1 MG PO TABS
4.0000 mg | ORAL_TABLET | Freq: Every day | ORAL | Status: DC
  Administered 2021-03-13: 21:00:00 4 mg via ORAL
  Filled 2021-03-13: qty 4

## 2021-03-13 MED ORDER — PREGABALIN 75 MG PO CAPS
75.0000 mg | ORAL_CAPSULE | Freq: Three times a day (TID) | ORAL | Status: DC
  Administered 2021-03-13: 09:00:00 75 mg via ORAL
  Filled 2021-03-13: qty 1

## 2021-03-13 MED ORDER — HEPARIN SODIUM (PORCINE) 5000 UNIT/ML IJ SOLN
5000.0000 [IU] | Freq: Three times a day (TID) | INTRAMUSCULAR | Status: DC
  Administered 2021-03-13 – 2021-03-14 (×3): 5000 [IU] via SUBCUTANEOUS
  Filled 2021-03-13 (×3): qty 1

## 2021-03-13 MED ORDER — METOPROLOL SUCCINATE ER 25 MG PO TB24
25.0000 mg | ORAL_TABLET | Freq: Every day | ORAL | Status: DC
  Administered 2021-03-13 – 2021-03-14 (×2): 25 mg via ORAL
  Filled 2021-03-13 (×2): qty 1

## 2021-03-13 MED ORDER — CLOPIDOGREL BISULFATE 75 MG PO TABS
75.0000 mg | ORAL_TABLET | Freq: Every day | ORAL | Status: DC
  Administered 2021-03-13 – 2021-03-14 (×2): 75 mg via ORAL
  Filled 2021-03-13 (×2): qty 1

## 2021-03-13 MED ORDER — GABAPENTIN 300 MG PO CAPS
600.0000 mg | ORAL_CAPSULE | Freq: Two times a day (BID) | ORAL | Status: DC
  Administered 2021-03-13 – 2021-03-14 (×3): 600 mg via ORAL
  Filled 2021-03-13 (×3): qty 2

## 2021-03-13 NOTE — Assessment & Plan Note (Signed)
Resume home meds ?

## 2021-03-13 NOTE — Progress Notes (Addendum)
PROGRESS NOTE  Harry MATHENY NKN:[id number] DOB: January 17, 1941   PCP: [name], MD  Patient is from: Home.  Lives with wife.  Independent at baseline.  DOA: 03/12/2021 LOS: 0  Chief complaints:  Chief Complaint  Patient presents with   Nausea   Dizziness   Weakness   Cough   Urinary Frequency     Brief Narrative / Interim history: 80 year old M with PMH of combined CHF, CKD-3B, DM-2, CAD, PVD, HTN, HLD, RLS and anemia presenting with subjective fever, cough, nasal congestion and sore throat, and admitted with severe sepsis due to community-acquired pneumonia and RSV infection.  Since CXR consistent with RML and RLL infiltrate.  Full RVP panel positive for RSV.  Pro-Cal elevated to 10.62.  Blood cultures obtained.  Started on IV ceftriaxone, azithromycin and IV fluid.  Subjective: Seen and examined earlier this morning.  No major events overnight of this morning.  Reports productive cough with yellowish phlegm.  He denies hemoptysis.  Reports anterior chest pain mainly with cough.  He also endorses nasal congestion and sore throat.  Denies shortness of breath, GI or UTI symptoms.  Objective: Vitals:   03/12/21 1835 03/12/21 2147 03/13/21 0159 03/13/21 0634  BP: 139/77 126/63 129/67 (!) 158/79  Pulse: 94 100 (!) 106 (!) 108  Resp: 19 16 16 18   Temp: 99.2 F (37.3 C) 98.1 F (36.7 C) 97.9 F (36.6 C) 97.7 F (36.5 C)  TempSrc: Oral Oral Oral Oral  SpO2: 99% 100% 95% 95%  Weight:      Height:        Examination:  GENERAL: No apparent distress.  Nontoxic. HEENT: MMM.  Vision and hearing grossly intact.  NECK: Supple.  No apparent JVD.  RESP: 95% on RA.  No IWOB.  Bibasilar crackles, right> left. CVS:  RRR. Heart sounds normal.  ABD/GI/GU: BS+. Abd soft, NTND.  MSK/EXT:  Moves extremities. No apparent deformity. No edema.  SKIN: no apparent skin lesion or wound NEURO: Awake, alert and oriented appropriately.  No apparent focal neuro deficit. PSYCH: Calm. Normal  affect.   Procedures:  None  Microbiology summarized: [id number] and influenza PCR nonreactive. Full RVP panel positive for RSV. Blood cultures NGTD.  Assessment & Plan: Severe sepsis due to community-acquired pneumonia and RSV infection: POA.  Had fever, tachycardia, tachypnea leukocytosis and AKI on admission.  CXR personally reviewed shows RML and RLL infiltrate.  He has bibasilar crackles, R> L on exam.  RSV positive.  Pro-Cal elevated to 10.9 suggesting overlapping bacterial infection.  Blood cultures NGTD.  Sepsis physiology improving. Recent Labs    07/19/20 1253 08/05/20 0937 09/12/20 1205 09/12/20 1327 09/12/20 2020 09/13/20 0157 10/01/20 0759 10/03/20 1138 03/12/21 1009 03/13/21 0516  BUN 34* 28* 36* 34* 31* 28* 31* 27 41* 38*  CREATININE 1.58* 1.26* 1.62* 1.54* 1.40* 1.32* 1.38* 1.38* 1.63* 1.50*  -Continue ceftriaxone and azithromycin -Incentive spirometry, OOB, mucolytic's and antitussive -Ambulatory saturation assessment -Continue droplet precaution for RSV.  Acute respiratory failure resolved.  AKI/azotemia on CKD-3B: B/c Cr ~1.3.  Improving. Recent Labs    07/19/20 1253 08/05/20 0937 09/12/20 1205 09/12/20 1327 09/12/20 2020 09/13/20 0157 10/01/20 0759 10/03/20 1138 03/12/21 1009 03/13/21 0516  BUN 34* 28* 36* 34* 31* 28* 31* 27 41* 38*  CREATININE 1.58* 1.26* 1.62* 1.54* 1.40* 1.32* 1.38* 1.38* 1.63* 1.50*  -Continue gentle IV fluid -Recheck in the morning  Nontraumatic rhabdomyolysis: Likely due to the above. -Gentle IV fluid -Continue holding Crestor  Chronic combined CHF: TTE in 08/2020  with LVEF of 50% and G1 DD.  Does not seem to be on diuretics at home. -Hold home [place] -Resume home metoprolol -Monitor fluid and respiratory status closely while on IV fluid.  Uncontrolled NIDDM-2 with neuropathy, CKD-3B and macrovascular complication: O9G 2.9%. Recent Labs  Lab 03/12/21 1752 03/12/21 2352 03/13/21 0453 03/13/21 0815  GLUCAP 171*  120* 117* 111*  -Continue to hold Farxiga in the setting of infectious process -Continue holding home metformin and Trulicity -Continue SSI-sensitive -Hold Crestor in the setting of rhabdomyolysis.  History of CAD/PVD -Continue home Plavix  -Holding statin in the setting of rhabdomyolysis.  Essential hypertension -Resume home Toprol-XL  Anxiety/depression/RLS -Continue home gabapentin and Requip -Discontinue Lyrica-no longer take this.  Leukocytosis/bandemia: Resolved.  Elevated AST: Likely due to rhabdo -Recheck in the morning.  Addendum Recurrent fall: Patient's wife reports 3 episodes of fall in about 1 to 2 weeks.  Reports dizziness but not able to elaborate.  Neuro exam unremarkable.  He has history of radiculopathy.  He has upcoming appointment with neurosurgery for decompression/L2-3 fusion.  No red flags. -PT/OT eval -Outpatient follow-up with neurosurgery.  Body mass index is 22.98 kg/m.         DVT prophylaxis:  SCDs Start: 03/12/21 1914 SCD's Start: 03/12/21 1913 SCD's Start: 03/12/21 1913 Start subcu heparin.  Code Status: Full code Family Communication: Updated patient's wife at bedside. Level of care: Telemetry Status is: Observation  The patient will require care spanning > 2 midnights and should be moved to inpatient because: Severe sepsis due to community-acquired pneumonia requiring IV antibiotics, rhabdomyolysis and AKI requiring IV fluids   Final disposition: Likely home in the next 24 to 48 hours.   Consultants:  None   Sch Meds:  Scheduled Meds:  clopidogrel  75 mg Oral Daily   DULoxetine  30 mg Oral Daily   gabapentin  600 mg Oral BID   guaiFENesin  600 mg Oral BID   insulin aspart  0-9 Units Subcutaneous Q4H   pregabalin  75 mg Oral TID   rOPINIRole  4 mg Oral QHS   Continuous Infusions:  sodium chloride 75 mL/hr at 03/13/21 0902   azithromycin 500 mg (03/13/21 1036)   cefTRIAXone (ROCEPHIN)  IV     PRN Meds:.acetaminophen  **OR** acetaminophen, albuterol  Antimicrobials: Anti-infectives (From admission, onward)    Start     Dose/Rate Route Frequency Ordered Stop   03/20/21 0600  ceFAZolin (ANCEF) IVPB 2g/100 mL premix  Status:  Discontinued        2 g 200 mL/hr over 30 Minutes Intravenous On call to O.R. 03/12/21 1912 03/12/21 1941   03/13/21 1100  cefTRIAXone (ROCEPHIN) 2 g in sodium chloride 0.9 % 100 mL IVPB        2 g 200 mL/hr over 30 Minutes Intravenous Every 24 hours 03/12/21 1915     03/13/21 1100  azithromycin (ZITHROMAX) 500 mg in sodium chloride 0.9 % 250 mL IVPB        500 mg 250 mL/hr over 60 Minutes Intravenous Every 24 hours 03/12/21 1915     03/13/21 0600  ceFAZolin (ANCEF) IVPB 2g/100 mL premix  Status:  Discontinued        2 g 200 mL/hr over 30 Minutes Intravenous On call to O.R. 03/12/21 1912 03/12/21 1920   03/12/21 1115  cefTRIAXone (ROCEPHIN) 1 g in sodium chloride 0.9 % 100 mL IVPB        1 g 200 mL/hr over 30 Minutes Intravenous  Once 03/12/21 1101  03/12/21 1141   03/12/21 1115  azithromycin (ZITHROMAX) 500 mg in sodium chloride 0.9 % 250 mL IVPB        500 mg 250 mL/hr over 60 Minutes Intravenous  Once 03/12/21 1101 03/12/21 1214        I have personally reviewed the following labs and images: CBC: Recent Labs  Lab 03/12/21 1009 03/13/21 0516  WBC 13.5* 8.8  NEUTROABS 11.2* 6.8  HGB 11.3* 9.9*  HCT 35.3* 29.8*  MCV 92.4 91.7  PLT 205 198   BMP &GFR Recent Labs  Lab 03/12/21 1009 03/13/21 0516  NA 136 135  K 3.9 3.8  CL 97* 99  CO2 24 24  GLUCOSE 154* 124*  BUN 41* 38*  CREATININE 1.63* 1.50*  CALCIUM 9.3 8.4*  MG  --  2.5*  PHOS  --  2.2*   Estimated Creatinine Clearance: 44.4 mL/min (A) (by C-G formula based on SCr of 1.5 mg/dL (H)). Liver & Pancreas: Recent Labs  Lab 03/12/21 1009 03/13/21 0516  AST 52* 68*  ALT 22 27  ALKPHOS 75 70  BILITOT 1.3* 1.2  PROT 7.5 6.8  ALBUMIN 3.9 2.8*   Recent Labs  Lab 03/12/21 1022  LIPASE 38   No  results for input(s): AMMONIA in the last 168 hours. Diabetic: Recent Labs    03/12/21 1940  HGBA1C 8.1*   Recent Labs  Lab 03/12/21 1752 03/12/21 2352 03/13/21 0453 03/13/21 0815  GLUCAP 171* 120* 117* 111*   Cardiac Enzymes: Recent Labs  Lab 03/12/21 1940 03/13/21 0516  CKTOTAL 854* 1,052*   No results for input(s): PROBNP in the last 8760 hours. Coagulation Profile: Recent Labs  Lab 03/12/21 1009  INR 1.1   Thyroid Function Tests: Recent Labs    03/13/21 0516  TSH 0.274*   Lipid Profile: No results for input(s): CHOL, HDL, LDLCALC, TRIG, CHOLHDL, LDLDIRECT in the last 72 hours. Anemia Panel: No results for input(s): VITAMINB12, FOLATE, FERRITIN, TIBC, IRON, RETICCTPCT in the last 72 hours. Urine analysis:    Component Value Date/Time   COLORURINE YELLOW 03/12/2021 1009   APPEARANCEUR HAZY (A) 03/12/2021 1009   LABSPEC 1.016 03/12/2021 1009   PHURINE 5.5 03/12/2021 1009   GLUCOSEU >1,000 (A) 03/12/2021 1009   HGBUR LARGE (A) 03/12/2021 1009   BILIRUBINUR NEGATIVE 03/12/2021 1009   KETONESUR 15 (A) 03/12/2021 1009   PROTEINUR 100 (A) 03/12/2021 1009   NITRITE NEGATIVE 03/12/2021 1009   LEUKOCYTESUR NEGATIVE 03/12/2021 1009   Sepsis Labs: Invalid input(s): PROCALCITONIN, [place]  Microbiology: Recent Results (from the past 240 hour(s))  Resp Panel by RT-PCR (Flu A&B, Covid) Nasopharyngeal Swab     Status: None   Collection Time: 03/12/21 10:09 AM   Specimen: Nasopharyngeal Swab; Nasopharyngeal(NP) swabs in vial transport medium  Result Value Ref Range Status   SARS Coronavirus 2 by RT PCR NEGATIVE NEGATIVE Final    Comment: (NOTE) SARS-CoV-2 target nucleic acids are NOT DETECTED.  The SARS-CoV-2 RNA is generally detectable in upper respiratory specimens during the acute phase of infection. The lowest concentration of SARS-CoV-2 viral copies this assay can detect is 138 copies/mL. A negative result does not preclude SARS-Cov-2 infection and  should not be used as the sole basis for treatment or other patient management decisions. A negative result may occur with  improper specimen collection/handling, submission of specimen other than nasopharyngeal swab, presence of viral mutation(s) within the areas targeted by this assay, and inadequate number of viral copies(<138 copies/mL). A negative result must be combined with  clinical observations, patient history, and epidemiological information. The expected result is Negative.  Fact Sheet for Patients:  EntrepreneurPulse.com.au  Fact Sheet for Healthcare Providers:  IncredibleEmployment.be  This test is no t yet approved or cleared by the [place] FDA and  has been authorized for detection and/or diagnosis of SARS-CoV-2 by FDA under an Emergency Use Authorization (EUA). This EUA will remain  in effect (meaning this test can be used) for the duration of the COVID-19 declaration under Section 564(b)(1) of the Act, 21 U.S.C.section 360bbb-3(b)(1), unless the authorization is terminated  or revoked sooner.       Influenza A by PCR NEGATIVE NEGATIVE Final   Influenza B by PCR NEGATIVE NEGATIVE Final    Comment: (NOTE) The Xpert Xpress SARS-CoV-2/FLU/RSV plus assay is intended as an aid in the diagnosis of influenza from Nasopharyngeal swab specimens and should not be used as a sole basis for treatment. Nasal washings and aspirates are unacceptable for Xpert Xpress SARS-CoV-2/FLU/RSV testing.  Fact Sheet for Patients: EntrepreneurPulse.com.au  Fact Sheet for Healthcare Providers: IncredibleEmployment.be  This test is not yet approved or cleared by the [place] FDA and has been authorized for detection and/or diagnosis of SARS-CoV-2 by FDA under an Emergency Use Authorization (EUA). This EUA will remain in effect (meaning this test can be used) for the duration of the COVID-19 declaration  under Section 564(b)(1) of the Act, 21 U.S.C. section 360bbb-3(b)(1), unless the authorization is terminated or revoked.  Performed at [facility], [address] [place], [place] [address]   Culture, blood (Routine x 2)     Status: None (Preliminary result)   Collection Time: 03/12/21 10:09 AM   Specimen: BLOOD RIGHT FOREARM  Result Value Ref Range Status   Specimen Description   Final    BLOOD RIGHT FOREARM Performed at [facility], [address] [place], [place] [address]    Special Requests   Final    BOTTLES DRAWN AEROBIC AND ANAEROBIC Blood Culture adequate volume Performed at [facility], [address], [place], [place] [address]    Culture   Final    NO GROWTH < 24 HOURS Performed at [facility], [place] [address], [place], [place] [address]    Report Status PENDING  Incomplete  Culture, blood (Routine x 2)     Status: None (Preliminary result)   Collection Time: 03/12/21 10:09 AM   Specimen: Left Antecubital; Blood  Result Value Ref Range Status   Specimen Description   Final    LEFT ANTECUBITAL Performed at [facility], [address], [place], [place] [address]    Special Requests   Final    BOTTLES DRAWN AEROBIC AND ANAEROBIC Blood Culture results may not be optimal due to an excessive volume of blood received in culture bottles Performed at [facility], [address], [place], [place] [address]    Culture   Final    NO GROWTH < 24 HOURS Performed at [facility], [place] [address], [place],  [address]    Report Status PENDING  Incomplete  Respiratory (~20 pathogens) panel by PCR     Status: Abnormal   Collection Time: 03/12/21 10:10 PM   Specimen: Nasopharyngeal Swab; Respiratory  Result Value Ref Range Status   Adenovirus NOT DETECTED NOT DETECTED Final   Coronavirus 229E NOT DETECTED NOT DETECTED Final    Comment: (NOTE) The  Coronavirus on the Respiratory Panel, DOES NOT test for the novel  Coronavirus (2019 nCoV)  Coronavirus HKU1 NOT DETECTED NOT DETECTED Final   Coronavirus NL63 NOT DETECTED NOT DETECTED Final   Coronavirus OC43 NOT DETECTED NOT DETECTED Final   Metapneumovirus NOT DETECTED NOT DETECTED Final   Rhinovirus / Enterovirus NOT DETECTED NOT DETECTED Final   Influenza A NOT DETECTED NOT DETECTED Final   Influenza B NOT DETECTED NOT DETECTED Final   Parainfluenza Virus 1 NOT DETECTED NOT DETECTED Final   Parainfluenza Virus 2 NOT DETECTED NOT DETECTED Final   Parainfluenza Virus 3 NOT DETECTED NOT DETECTED Final   Parainfluenza Virus 4 NOT DETECTED NOT DETECTED Final   Respiratory Syncytial Virus DETECTED (A) NOT DETECTED Final   Bordetella pertussis NOT DETECTED NOT DETECTED Final   Bordetella Parapertussis NOT DETECTED NOT DETECTED Final   Chlamydophila pneumoniae NOT DETECTED NOT DETECTED Final   Mycoplasma pneumoniae NOT DETECTED NOT DETECTED Final    Comment: Performed at [facility], [place] [address], [place], [place] [address]    Radiology Studies: No results found.    Keeghan Bialy T. [place]  If 7PM-7AM, please contact night-coverage [email or web address] 03/13/2021, 11:00 AM

## 2021-03-13 NOTE — Assessment & Plan Note (Signed)
Rehydrate and recheck in a.m. 

## 2021-03-13 NOTE — Assessment & Plan Note (Signed)
-    evidence of acute renal failure due to presence of following: Cr increased >0.3 from baseline      check FeNA       Rehydrate with IV fluids  History does not suggest urinary retention or obstruction

## 2021-03-13 NOTE — Assessment & Plan Note (Signed)
- -  Patient presenting with productive cough, fever   and infiltrate   on chest x-ray -Infiltrate on CXR and 2-3 characteristics (fever, leukocytosis, purulent sputum) are consistent with pneumonia. -This appears to be most likely community-acquired pneumonia.   will admit for treatment of CAP will start on appropriate antibiotic coverage. - Rocephin/azithromycin   Obtain:  sputum cultures,                 Obtain respiratory panel                   influenza serologies negative                  COVID PCR negative                    blood cultures and sputum cultures ordered                   strep pneumo UA antigen,                   check for Legionella antigen.                Provide oxygen as needed.

## 2021-03-13 NOTE — Assessment & Plan Note (Deleted)
-    evidence of acute renal failure due to presence of following: Cr increased >0.3 from baseline      check FeNA       Rehydrate with IV fluids  History does not suggest urinary retention or obstruction

## 2021-03-13 NOTE — Assessment & Plan Note (Signed)
-  SIRS criteria met with  elevated white blood cell count,       Component Value Date/Time   WBC 13.5 (H) 03/12/2021 1009   LYMPHSABS 1.0 03/12/2021 1009     tachycardia   ,  Fever  RR >20 Today's Vitals   03/12/21 2147 03/13/21 0000 03/13/21 0155 03/13/21 0159  BP: 126/63   129/67  Pulse: 100   (!) 106  Resp: 16   16  Temp: 98.1 F (36.7 C)   97.9 F (36.6 C)  TempSrc: Oral   Oral  SpO2: 100%   95%  Weight:      Height:      PainSc: 0-No pain 0-No pain 0-No pain    Body mass index is 22.98 kg/m.  This patient meets SIRS Criteria and may be septic   The recent clinical data is shown below. Vitals:   03/12/21 1700 03/12/21 1835 03/12/21 2147 03/13/21 0159  BP: (!) 150/69 139/77 126/63 129/67  Pulse: (!) 101 94 100 (!) 106  Resp: _0 Temp:  99.2 F (37.3 C) 98.1 F (36.7 C) 97.9 F (36.6 C)  TempSrc:  Oral Oral Oral  SpO2: 97% 99% 100% 95%  Weight:      Height:         -Most likely source being  Pulmonary,    Patient meeting criteria for Severe sepsis with    evidence of end organ damage/organ dysfunction such as     Acute hypoxia requiring new supplemental oxygen, SpO2: 95 % O2 Flow Rate (L/min): 2 L/min      - Obtain serial lactic acid and procalcitonin level.  - Initiated IV antibiotics in ER: Antibiotics Given (last 72 hours)    Date/Time Action Medication Dose Rate   03/12/21 1111 New Bag/Given   cefTRIAXone (ROCEPHIN) 1 g in sodium chloride 0.9 % 100 mL IVPB 1 g 200 mL/hr   03/12/21 1114 New Bag/Given   azithromycin (ZITHROMAX) 500 mg in sodium chloride 0.9 % 250 mL IVPB 500 mg 250 mL/hr      Will continue     - await results of blood and urine culture  - Rehydrate aggressively  Intravenous fluids were administered      3:24 AM

## 2021-03-14 DIAGNOSIS — R296 Repeated falls: Secondary | ICD-10-CM

## 2021-03-14 DIAGNOSIS — M5417 Radiculopathy, lumbosacral region: Secondary | ICD-10-CM

## 2021-03-14 DIAGNOSIS — E11649 Type 2 diabetes mellitus with hypoglycemia without coma: Secondary | ICD-10-CM

## 2021-03-14 LAB — COMPREHENSIVE METABOLIC PANEL
ALT: 51 U/L — ABNORMAL HIGH (ref 0–44)
AST: 116 U/L — ABNORMAL HIGH (ref 15–41)
Albumin: 2.6 g/dL — ABNORMAL LOW (ref 3.5–5.0)
Alkaline Phosphatase: 70 U/L (ref 38–126)
Anion gap: 9 (ref 5–15)
BUN: 30 mg/dL — ABNORMAL HIGH (ref 8–23)
CO2: 23 mmol/L (ref 22–32)
Calcium: 8.1 mg/dL — ABNORMAL LOW (ref 8.9–10.3)
Chloride: 104 mmol/L (ref 98–111)
Creatinine, Ser: 1.41 mg/dL — ABNORMAL HIGH (ref 0.61–1.24)
GFR, Estimated: 50 mL/min — ABNORMAL LOW (ref >60)
Glucose, Bld: 129 mg/dL — ABNORMAL HIGH (ref 70–99)
Potassium: 3.3 mmol/L — ABNORMAL LOW (ref 3.5–5.1)
Sodium: 136 mmol/L (ref 135–145)
Total Bilirubin: 0.7 mg/dL (ref 0.3–1.2)
Total Protein: 6.2 g/dL — ABNORMAL LOW (ref 6.5–8.1)

## 2021-03-14 LAB — CBC
HCT: 30 % — ABNORMAL LOW (ref 39.0–52.0)
Hemoglobin: 9.8 g/dL — ABNORMAL LOW (ref 13.0–17.0)
MCH: 30.5 pg (ref 26.0–34.0)
MCHC: 32.7 g/dL (ref 30.0–36.0)
MCV: 93.5 fL (ref 80.0–100.0)
Platelets: 213 10*3/uL (ref 150–400)
RBC: 3.21 MIL/uL — ABNORMAL LOW (ref 4.22–5.81)
RDW: 14.5 % (ref 11.5–15.5)
WBC: 6.6 10*3/uL (ref 4.0–10.5)
nRBC: 0 % (ref 0.0–0.2)

## 2021-03-14 LAB — LEGIONELLA PNEUMOPHILA SEROGP 1 UR AG: L. pneumophila Serogp 1 Ur Ag: NEGATIVE

## 2021-03-14 LAB — GLUCOSE, CAPILLARY
Glucose-Capillary: 150 mg/dL — ABNORMAL HIGH (ref 70–99)
Glucose-Capillary: 125 mg/dL — ABNORMAL HIGH (ref 70–99)

## 2021-03-14 LAB — PROCALCITONIN: Procalcitonin: 4.14 ng/mL

## 2021-03-14 LAB — MAGNESIUM: Magnesium: 2.3 mg/dL (ref 1.7–2.4)

## 2021-03-14 LAB — CK: Total CK: 504 U/L — ABNORMAL HIGH (ref 49–397)

## 2021-03-14 MED ORDER — ROSUVASTATIN CALCIUM 40 MG PO TABS: 40.0000 mg | ORAL_TABLET | Freq: Every day | ORAL | 3 refills | Status: DC

## 2021-03-14 MED ORDER — POTASSIUM CHLORIDE CRYS ER 20 MEQ PO TBCR
40.0000 meq | EXTENDED_RELEASE_TABLET | ORAL | Status: AC
  Administered 2021-03-14 (×2): 40 meq via ORAL
  Filled 2021-03-14 (×2): qty 2

## 2021-03-14 MED ORDER — CEFDINIR 300 MG PO CAPS: 300.0000 mg | ORAL_CAPSULE | Freq: Two times a day (BID) | ORAL | 0 refills | Status: DC

## 2021-03-14 MED ORDER — AZITHROMYCIN 250 MG PO TABS: 250.0000 mg | ORAL_TABLET | Freq: Every day | ORAL | 0 refills | Status: DC

## 2021-03-14 NOTE — Discharge Summary (Signed)
Physician Discharge Summary  Harry Zamora [facility] OVF:[id number] DOB: Feb 21, 1941 DOA: 03/12/2021  PCP: [name], MD  Admit date: 03/12/2021 Discharge date: 03/14/2021 Admitted From: Home Disposition: Home Recommendations for Outpatient Follow-up:  Follow ups as below. Please obtain CBC/CMP/Mag at follow up Resume home Crestor if LFT normalizes. Please follow up on the following pending results: None Home Health: Not indicated Equipment/Devices: Not indicated Discharge Condition: Stable CODE STATUS: Full code  Follow-up Information     [name], MD. Schedule an appointment as soon as possible for a visit in 1 week(s).   Specialty: Endocrinology Contact information: [place] [id number] [place] Hospital Course: 80 year old M with PMH of combined CHF, CKD-3B, DM-2, CAD, PVD, HTN, HLD, RLS and anemia presenting with subjective fever, cough, nasal congestion and sore throat, and admitted with severe sepsis due to community-acquired pneumonia and RSV infection.  Since CXR consistent with RML and RLL infiltrate.  Full RVP panel positive for RSV.  Pro-Cal elevated to 10.62.  Blood cultures obtained.  Started on IV ceftriaxone, azithromycin and IV fluid.  Patient's respiratory symptoms improved.  He was ambulated without desaturation or respiratory distress.  Rhabdomyolysis improved as well.  Discharged on p.o. cefdinir for 3 more days to complete treatment course.  See individual problem list below for more on hospital course.  Discharge Diagnoses:  Severe sepsis due to community-acquired pneumonia and RSV infection: POA.  Had fever, tachycardia, tachypnea leukocytosis and AKI on admission.  CXR personally reviewed shows RML and RLL infiltrate.  He has bibasilar crackles, R> L on exam.  RSV positive.  Pro-Cal elevated to 10.9 suggesting overlapping bacterial infection.  Blood cultures NGTD.  Sepsis physiology resolved.  Pro-Cal down to  4.1 -Received ceftriaxone and azithromycin from 12/28-12/30.  Discharged on p.o. cefdinir for 3 more days.   Acute respiratory failure resolved.  Ruled out.   AKI/azotemia on CKD-3B: B/c Cr ~1.3.  Improved. Recent Labs    08/05/20 0937 09/12/20 1205 09/12/20 1327 09/12/20 2020 09/13/20 0157 10/01/20 0759 10/03/20 1138 03/12/21 1009 03/13/21 0516 03/14/21 0540  BUN 28* 36* 34* 31* 28* 31* 27 41* 38* 30*  CREATININE 1.26* 1.62* 1.54* 1.40* 1.32* 1.38* 1.38* 1.63* 1.50* 1.41*  -Recheck renal function in about 1 week   Nontraumatic rhabdomyolysis: Likely due to the above.  CK down from 1000-500 -Hold Crestor for 1 week   Chronic combined CHF: TTE in 08/2020 with LVEF of 50% and G1 DD.  Does not seem to be on diuretics at home. -Continue home medications   Uncontrolled NIDDM-2 with neuropathy, CKD-3B and macrovascular complication: A6T 0.1%. -Continue home medications   History of CAD/PVD -Continue home Plavix  -Holding statin in the setting of rhabdomyolysis and elevated liver enzymes.  Elevated liver enzymes: Mild AST and ALT elevation consistent with rhabdomyolysis. -Hold Crestor for 1 week   Essential hypertension: Continue home meds.   Anxiety/depression/RLS -Continue home gabapentin and Requip -Discontinue Lyrica-no longer take this.   Leukocytosis/bandemia: Resolved.   Recurrent fall: Patient's wife reports 3 episodes of fall in about 1 to 2 weeks.  Reports dizziness but not able to elaborate.  He was not orthostatic.  Neuro exam unremarkable.  He has history of radiculopathy.  He has upcoming appointment with neurosurgery for decompression/L2-3 fusion.  No red flags.  Evaluated and cleared by PT/OT and no needles identified. -Outpatient follow-up with neurosurgery.  Body mass index is 22.98 kg/m.  Discharge Exam: Vitals:   03/13/21 1411 03/13/21 2347 03/13/21 2355 03/14/21 0338  BP: 130/71 (!) 122/58  (!) 96/55  Pulse: 91 91  70  Temp: (!) 97.5  F (36.4 C)  98.3 F (36.8 C) Comment: took axillary because temp not reading on machine. Guy Franco 03/13/21 (!) 97.5 F (36.4 C)  Resp: 18 20  18   Height:      Weight:      SpO2: 95% 96%  96%  TempSrc: Oral  Axillary Oral  BMI (Calculated):         GENERAL: No apparent distress.  Nontoxic. HEENT: MMM.  Vision and hearing grossly intact.  NECK: Supple.  No apparent JVD.  RESP: 96% on RA.  No IWOB.  Fair aeration bilaterally. CVS:  RRR. Heart sounds normal.  ABD/GI/GU: Bowel sounds present. Soft. Non tender.  MSK/EXT:  Moves extremities. No apparent deformity. No edema.  SKIN: no apparent skin lesion or wound NEURO: Awake and alert.  Oriented appropriately.  No apparent focal neuro deficit. PSYCH: Calm. Normal affect.   Discharge Instructions  Discharge Instructions     Call MD for:  difficulty breathing, headache or visual disturbances   Complete by: As directed    Call MD for:  extreme fatigue   Complete by: As directed    Call MD for:  temperature >100.4   Complete by: As directed    Diet - low sodium heart healthy   Complete by: As directed    Diet Carb Modified   Complete by: As directed    Discharge instructions   Complete by: As directed    It has been a pleasure taking care of you!  You were hospitalized due to RSV infection and bacterial pneumonia.  You have been started on antibiotic for bacterial pneumonia, and your symptoms improved to the point we think it is safe to let you go home.  We are discharging you more antibiotics to complete treatment course.  Please hold your cholesterol medication for 1 week until your liver enzymes are back to normal.  Please review your new medication list and the directions on your medications before you take them.  Follow-up with your primary care doctor in 1 to 2 weeks or sooner if needed.   Take care,   Increase activity slowly   Complete by: As directed       Allergies as of 03/14/2021       Reactions    Levaquin [levofloxacin In D5w] Rash   Niaspan [niacin Er] Rash, Other (See Comments)   Flushing reaction   Tramadol Rash        Medication List     STOP taking these medications    cilostazol 100 MG tablet Commonly known as: PLETAL   pregabalin 75 MG capsule Commonly known as: LYRICA       TAKE these medications    azithromycin 250 MG tablet Commonly known as: Zithromax Take 1 tablet (250 mg total) by mouth daily. Start taking on: March 15, 2021   cefdinir 300 MG capsule Commonly known as: OMNICEF Take 1 capsule (300 mg total) by mouth 2 (two) times daily. Start taking on: March 15, 2021   clopidogrel 75 MG tablet Commonly known as: PLAVIX Take 1 tablet (75 mg total) by mouth daily.   DULoxetine 30 MG capsule Commonly known as: CYMBALTA Take 30 mg by mouth daily.   Farxiga 10 MG Tabs tablet Generic drug: dapagliflozin propanediol Take 10 mg by mouth daily.   gabapentin 600  MG tablet Commonly known as: NEURONTIN Take 600 mg by mouth 2 (two) times daily.   metFORMIN 500 MG tablet Commonly known as: GLUCOPHAGE Take 2 tablets (1,000 mg total) by mouth 2 (two) times daily with a meal. Resume 10/03/2020   metoprolol succinate 25 MG 24 hr tablet Commonly known as: TOPROL-XL TAKE 1 TABLET(25 MG) BY MOUTH DAILY   nitroGLYCERIN 0.4 MG SL tablet Commonly known as: NITROSTAT Place 1 tablet (0.4 mg total) under the tongue every 5 (five) minutes as needed for chest pain.   polyethylene glycol powder 17 GM/SCOOP powder Commonly known as: GLYCOLAX/MIRALAX See admin instructions.   rOPINIRole 4 MG tablet Commonly known as: REQUIP Take 4 mg by mouth at bedtime.   rosuvastatin 40 MG tablet Commonly known as: CRESTOR Take 1 tablet (40 mg total) by mouth daily. Start taking on: March 21, 2021 What changed:  See the new instructions. These instructions start on March 21, 2021. If you are unsure what to do until then, ask your doctor or other care  provider.   Trulicity 1.5 OF/7.5ZW Sopn Generic drug: Dulaglutide Inject 1.5 mg into the skin every Wednesday.   vitamin B-12 500 MCG tablet Commonly known as: CYANOCOBALAMIN Take 500 mcg by mouth daily.        Consultations: None  Procedures/Studies:   DG Chest Port 1 View  Result Date: 03/12/2021 CLINICAL DATA:  Suspected Sepsis EXAM: PORTABLE CHEST 1 VIEW COMPARISON:  May 2022 FINDINGS: Patchy interstitial opacities bilaterally. No pleural effusion or pneumothorax. Normal heart size. IMPRESSION: Patchy interstitial opacities bilaterally suspicious for atypical/viral pneumonia. Electronically Signed   By: Macy Mis M.D.   On: 03/12/2021 10:38       The results of significant diagnostics from this hospitalization (including imaging, microbiology, ancillary and laboratory) are listed below for reference.     Microbiology: Recent Results (from the past 240 hour(s))  Resp Panel by RT-PCR (Flu A&B, Covid) Nasopharyngeal Swab     Status: None   Collection Time: 03/12/21 10:09 AM   Specimen: Nasopharyngeal Swab; Nasopharyngeal(NP) swabs in vial transport medium  Result Value Ref Range Status   SARS Coronavirus 2 by RT PCR NEGATIVE NEGATIVE Final    Comment: (NOTE) SARS-CoV-2 target nucleic acids are NOT DETECTED.  The SARS-CoV-2 RNA is generally detectable in upper respiratory specimens during the acute phase of infection. The lowest concentration of SARS-CoV-2 viral copies this assay can detect is 138 copies/mL. A negative result does not preclude SARS-Cov-2 infection and should not be used as the sole basis for treatment or other patient management decisions. A negative result may occur with  improper specimen collection/handling, submission of specimen other than nasopharyngeal swab, presence of viral mutation(s) within the areas targeted by this assay, and inadequate number of viral copies(<138 copies/mL). A negative result must be combined with clinical  observations, patient history, and epidemiological information. The expected result is Negative.  Fact Sheet for Patients:  EntrepreneurPulse.com.au  Fact Sheet for Healthcare Providers:  IncredibleEmployment.be  This test is no t yet approved or cleared by the [place] FDA and  has been authorized for detection and/or diagnosis of SARS-CoV-2 by FDA under an Emergency Use Authorization (EUA). This EUA will remain  in effect (meaning this test can be used) for the duration of the COVID-19 declaration under Section 564(b)(1) of the Act, 21 U.S.C.section 360bbb-3(b)(1), unless the authorization is terminated  or revoked sooner.       Influenza A by PCR NEGATIVE NEGATIVE Final   Influenza B by  PCR NEGATIVE NEGATIVE Final    Comment: (NOTE) The Xpert Xpress SARS-CoV-2/FLU/RSV plus assay is intended as an aid in the diagnosis of influenza from Nasopharyngeal swab specimens and should not be used as a sole basis for treatment. Nasal washings and aspirates are unacceptable for Xpert Xpress SARS-CoV-2/FLU/RSV testing.  Fact Sheet for Patients: EntrepreneurPulse.com.au  Fact Sheet for Healthcare Providers: IncredibleEmployment.be  This test is not yet approved or cleared by the [place] FDA and has been authorized for detection and/or diagnosis of SARS-CoV-2 by FDA under an Emergency Use Authorization (EUA). This EUA will remain in effect (meaning this test can be used) for the duration of the COVID-19 declaration under Section 564(b)(1) of the Act, 21 U.S.C. section 360bbb-3(b)(1), unless the authorization is terminated or revoked.  Performed at [facility], [address], [place], [place] [address]   Culture, blood (Routine x 2)     Status: None (Preliminary result)   Collection Time: 03/12/21 10:09 AM   Specimen: BLOOD RIGHT FOREARM  Result Value Ref Range Status   Specimen  Description   Final    BLOOD RIGHT FOREARM Performed at [facility], [address], [place], [place] [address]    Special Requests   Final    BOTTLES DRAWN AEROBIC AND ANAEROBIC Blood Culture adequate volume Performed at [facility], [address], [place], [place] [address]    Culture   Final    NO GROWTH 2 DAYS Performed at [facility], [place] [address] [place], [place] [address]    Report Status PENDING  Incomplete  Culture, blood (Routine x 2)     Status: None (Preliminary result)   Collection Time: 03/12/21 10:09 AM   Specimen: Left Antecubital; Blood  Result Value Ref Range Status   Specimen Description   Final    LEFT ANTECUBITAL Performed at [facility], [address], [place], [place] [address]    Special Requests   Final    BOTTLES DRAWN AEROBIC AND ANAEROBIC Blood Culture results may not be optimal due to an excessive volume of blood received in culture bottles Performed at [facility], [address] [place], [place] [address]    Culture   Final    NO GROWTH 2 DAYS Performed at [facility], [place] [address], [place], [place] [address]    Report Status PENDING  Incomplete  Respiratory (~20 pathogens) panel by PCR     Status: Abnormal   Collection Time: 03/12/21 10:10 PM   Specimen: Nasopharyngeal Swab; Respiratory  Result Value Ref Range Status   Adenovirus NOT DETECTED NOT DETECTED Final   Coronavirus 229E NOT DETECTED NOT DETECTED Final    Comment: (NOTE) The Coronavirus on the Respiratory Panel, DOES NOT test for the novel  Coronavirus (2019 nCoV)    Coronavirus HKU1 NOT DETECTED NOT DETECTED Final   Coronavirus NL63 NOT DETECTED NOT DETECTED Final   Coronavirus OC43 NOT DETECTED NOT DETECTED Final   Metapneumovirus NOT DETECTED NOT DETECTED Final   Rhinovirus / Enterovirus NOT DETECTED NOT DETECTED Final   Influenza A NOT DETECTED NOT DETECTED Final    Influenza B NOT DETECTED NOT DETECTED Final   Parainfluenza Virus 1 NOT DETECTED NOT DETECTED Final   Parainfluenza Virus 2 NOT DETECTED NOT DETECTED Final   Parainfluenza Virus 3 NOT DETECTED NOT DETECTED Final   Parainfluenza Virus 4 NOT DETECTED NOT DETECTED Final   Respiratory Syncytial Virus DETECTED (A) NOT DETECTED Final   Bordetella pertussis NOT  DETECTED NOT DETECTED Final   Bordetella Parapertussis NOT DETECTED NOT DETECTED Final   Chlamydophila pneumoniae NOT DETECTED NOT DETECTED Final   Mycoplasma pneumoniae NOT DETECTED NOT DETECTED Final    Comment: Performed at [facility], [place] [address], [place], [place] [address]     Labs:  CBC: Recent Labs  Lab 03/12/21 1009 03/13/21 0516 03/14/21 0540  WBC 13.5* 8.8 6.6  NEUTROABS 11.2* 6.8  --   HGB 11.3* 9.9* 9.8*  HCT 35.3* 29.8* 30.0*  MCV 92.4 91.7 93.5  PLT 205 198 213   BMP &GFR Recent Labs  Lab 03/12/21 1009 03/13/21 0516 03/14/21 0540  NA 136 135 136  K 3.9 3.8 3.3*  CL 97* 99 104  CO2 24 24 23   GLUCOSE 154* 124* 129*  BUN 41* 38* 30*  CREATININE 1.63* 1.50* 1.41*  CALCIUM 9.3 8.4* 8.1*  MG  --  2.5* 2.3  PHOS  --  2.2*  --    Estimated Creatinine Clearance: 47.2 mL/min (A) (by C-G formula based on SCr of 1.41 mg/dL (H)). Liver & Pancreas: Recent Labs  Lab 03/12/21 1009 03/13/21 0516 03/14/21 0540  AST 52* 68* 116*  ALT 22 27 51*  ALKPHOS 75 70 70  BILITOT 1.3* 1.2 0.7  PROT 7.5 6.8 6.2*  ALBUMIN 3.9 2.8* 2.6*   Recent Labs  Lab 03/12/21 1022  LIPASE 38   No results for input(s): AMMONIA in the last 168 hours. Diabetic: Recent Labs    03/12/21 1940  HGBA1C 8.1*   Recent Labs  Lab 03/13/21 1611 03/13/21 2049 03/13/21 2337 03/14/21 0341 03/14/21 0811  GLUCAP 104* 198* 201* 150* 125*   Cardiac Enzymes: Recent Labs  Lab 03/12/21 1940 03/13/21 0516 03/14/21 0540  CKTOTAL 854* 1,052* 504*   No results for input(s): PROBNP in the last 8760 hours. Coagulation  Profile: Recent Labs  Lab 03/12/21 1009  INR 1.1   Thyroid Function Tests: Recent Labs    03/13/21 0516  TSH 0.274*   Lipid Profile: No results for input(s): CHOL, HDL, LDLCALC, TRIG, CHOLHDL, LDLDIRECT in the last 72 hours. Anemia Panel: No results for input(s): VITAMINB12, FOLATE, FERRITIN, TIBC, IRON, RETICCTPCT in the last 72 hours. Urine analysis:    Component Value Date/Time   COLORURINE YELLOW 03/12/2021 1009   APPEARANCEUR HAZY (A) 03/12/2021 1009   LABSPEC 1.016 03/12/2021 1009   PHURINE 5.5 03/12/2021 1009   GLUCOSEU >1,000 (A) 03/12/2021 1009   HGBUR LARGE (A) 03/12/2021 1009   BILIRUBINUR NEGATIVE 03/12/2021 1009   KETONESUR 15 (A) 03/12/2021 1009   PROTEINUR 100 (A) 03/12/2021 1009   NITRITE NEGATIVE 03/12/2021 1009   LEUKOCYTESUR NEGATIVE 03/12/2021 1009   Sepsis Labs: Invalid input(s): PROCALCITONIN, LACTICIDVEN   Time coordinating discharge: 55 minutes  SIGNED:  Mercy Riding, MD  [facility] 03/14/2021, 3:20 PM

## 2021-03-14 NOTE — TOC Transition Note (Signed)
Transition of Care [facility]) - CM/SW Discharge Note   Patient Details  Name: Harry Zamora MRN: [id number] Date of Birth: 1940/11/18  [facility] CM/SW Contact:  Leeroy Cha, RN Phone Number: 03/14/2021, 9:04 AM   Clinical Narrative:    Dcd to home with no needs   Final next level of care: Home/Self Care Barriers to Discharge: No Barriers Identified   Patient Goals and CMS Choice        Discharge Placement                       Discharge Plan and Services   Discharge Planning Services: CM Consult                                 Social Determinants of Health (SDOH) Interventions     Readmission Risk Interventions No flowsheet data found.

## 2021-03-14 NOTE — Evaluation (Signed)
Physical Therapy Evaluation Patient Details Name: Harry Zamora MRN: [id number] DOB: Aug 06, 1940 Today's Date: 03/14/2021  History of Present Illness  80 year old M with PMH of combined CHF, CKD-3B, DM-2, CAD, PVD, HTN, HLD, RLS and anemia presenting with subjective fever, cough, nasal congestion and sore throat, and admitted with severe sepsis due to community-acquired pneumonia and RSV infection.  Since CXR consistent with RML and RLL infiltrate.  Full RVP panel positive for RSV.  Clinical Impression  Patient evaluated by Physical Therapy with no further acute PT needs identified. All education has been completed and the patient has no further questions.  Pt states he likes to "sit around" at home. Discussed importance of activity with recovery(increasing activity slowly however not being completely sedentary, short walks in house, etc), especially with pending back surgery next week.  Pt wife in agreement with plan  See below for any follow-up Physical Therapy or equipment needs. PT is signing off. Thank you for this referral.        Recommendations for follow up therapy are one component of a multi-disciplinary discharge planning process, led by the attending physician.  Recommendations may be updated based on patient status, additional functional criteria and insurance authorization.  Follow Up Recommendations No PT follow up    Assistance Recommended at Discharge Intermittent Supervision/Assistance  Functional Status Assessment Patient has had a recent decline in their functional status and demonstrates the ability to make significant improvements in function in a reasonable and predictable amount of time.  Equipment Recommendations  None recommended by PT    Recommendations for Other Services       Precautions / Restrictions Precautions Precautions: Fall Restrictions Weight Bearing Restrictions: No      Mobility  Bed Mobility Overal bed mobility: Modified Independent                   Transfers Overall transfer level: Modified independent                      Ambulation/Gait Ambulation/Gait assistance: Supervision;Modified independent (Device/Increase time)   Assistive device: None Gait Pattern/deviations: Step-through pattern;Decreased stride length;Wide base of support;Knee flexed in stance - right;Knee flexed in stance - left       General Gait Details: flexed knees and wide BOS, unsteady but without overt LOB. stability improved with distance  Stairs            Wheelchair Mobility    Modified Rankin (Stroke Patients Only)       Balance Overall balance assessment: Needs assistance   Sitting balance-Leahy Scale: Good       Standing balance-Leahy Scale: Fair Standing balance comment: fair to good, NT to mod challenges             High level balance activites: Side stepping;Direction changes;Backward walking;Head turns High Level Balance Comments: unsteady with steppage response x1 however no assist required to recover balance             Pertinent Vitals/Pain Pain Assessment: No/denies pain    Home Living Family/patient expects to be discharged to:: Private residence Living Arrangements: Spouse/significant other Available Help at Discharge: Family;Available 24 hours/day Type of Home: House Home Access: Stairs to enter Entrance Stairs-Rails: None Entrance Stairs-Number of Steps: 1   Home Layout: One level Home Equipment: Conservation officer, nature (2 wheels);Cane - single point;Shower seat      Prior Function Prior Level of Function : Driving;Independent/Modified Independent;History of Falls (last six months)  Mobility Comments: recent fall prior to admission. pending back surgery       Hand Dominance        Extremity/Trunk Assessment   Upper Extremity Assessment Upper Extremity Assessment: Overall WFL for tasks assessed    Lower Extremity Assessment Lower Extremity Assessment:  Overall WFL for tasks assessed       Communication      Cognition Arousal/Alertness: Awake/alert Behavior During Therapy: WFL for tasks assessed/performed Overall Cognitive Status: Within Functional Limits for tasks assessed                                          General Comments      Exercises     Assessment/Plan    PT Assessment Patient does not need any further PT services  PT Problem List         PT Treatment Interventions      PT Goals (Current goals can be found in the Care Plan section)  Acute Rehab PT Goals PT Goal Formulation: All assessment and education complete, DC therapy    Frequency     Barriers to discharge        Co-evaluation               AM-PAC PT "6 Clicks" Mobility  Outcome Measure Help needed turning from your back to your side while in a flat bed without using bedrails?: None Help needed moving from lying on your back to sitting on the side of a flat bed without using bedrails?: None Help needed moving to and from a bed to a chair (including a wheelchair)?: None Help needed standing up from a chair using your arms (e.g., wheelchair or bedside chair)?: None Help needed to walk in hospital room?: None Help needed climbing 3-5 steps with a railing? : A Little 6 Click Score: 23    End of Session   Activity Tolerance: Patient tolerated treatment well Patient left: in bed;with call bell/phone within reach;with family/visitor present Nurse Communication: Mobility status PT Visit Diagnosis: Unsteadiness on feet (R26.81);History of falling (Z91.81)    Time: 0947-0962 PT Time Calculation (min) (ACUTE ONLY): 15 min   Charges:   PT Evaluation $PT Eval Low Complexity: Dawson, PT  Acute Rehab Dept ([place]) [phone] Pager [phone]  03/14/2021   [facility] 03/14/2021, 11:30 AM

## 2021-03-14 NOTE — Care Management Obs Status (Signed)
[facility] NOTIFICATION   Patient Details  Name: Harry Zamora MRN: [id number] Date of Birth: [birth year]   Medicare Observation Status Notification Given:  Yes    Leeroy Cha, RN 03/14/2021, 9:06 AM

## 2021-03-17 LAB — CULTURE, BLOOD (ROUTINE X 2)
Culture: NO GROWTH
Culture: NO GROWTH
Special Requests: ADEQUATE

## 2021-03-18 ENCOUNTER — Ambulatory Visit
Admission: RE | Admit: 2021-03-18 | Discharge: 2021-03-18 | Disposition: A | Discharge status: Home / Self Care | Payer: Medicare PPO | Source: Ambulatory Visit | Attending: Surgery | Admitting: Surgery

## 2021-03-18 DIAGNOSIS — I719 Aortic aneurysm of unspecified site, without rupture: Secondary | ICD-10-CM

## 2021-03-18 IMAGING — CT CT ANGIO CHEST
1 of 2 series · 18 of 32 positions shown · IV contrast (APPLIED)
Comparison: CT [DATE], radiograph [DATE]

CLINICAL DATA: penetrating ulcer

EXAM:
CT ANGIOGRAPHY CHEST WITH CONTRAST
TECHNIQUE: Multidetector CT imaging of the chest was performed using the
standard protocol during bolus administration of intravenous
contrast. Multiplanar CT image reconstructions and MIPs were
obtained to evaluate the vascular anatomy.
CONTRAST:  75mL [HH] IOPAMIDOL ([HH]) INJECTION 76%

[Series 9: thins · axial · 0.80mm/px · z∈[+949,+1268]mm · 18 of 436 slices shown]
[im 19/436  lung]
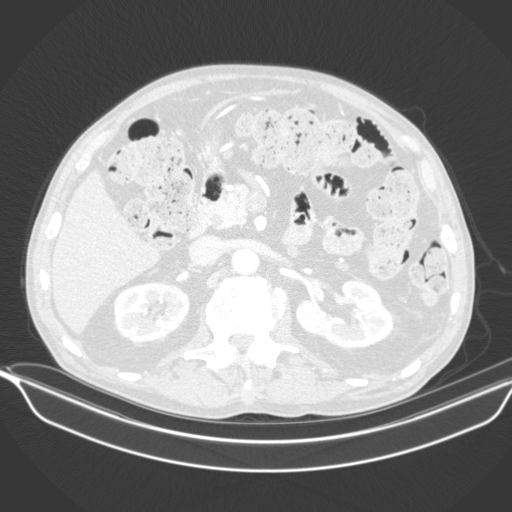
[im 37/436  soft-tissue]
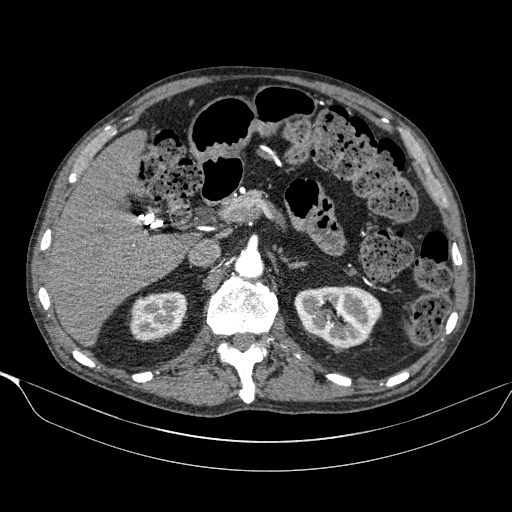
[im 73/436  lung]
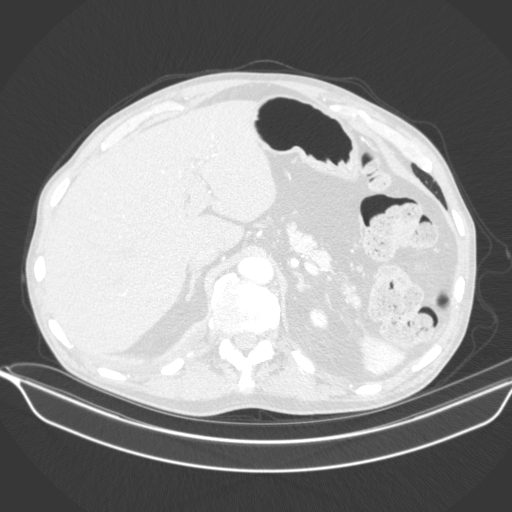
[im 91/436  soft-tissue]
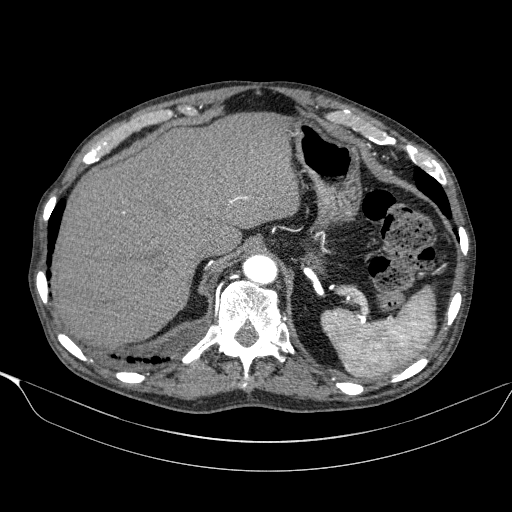
[im 109/436  lung]
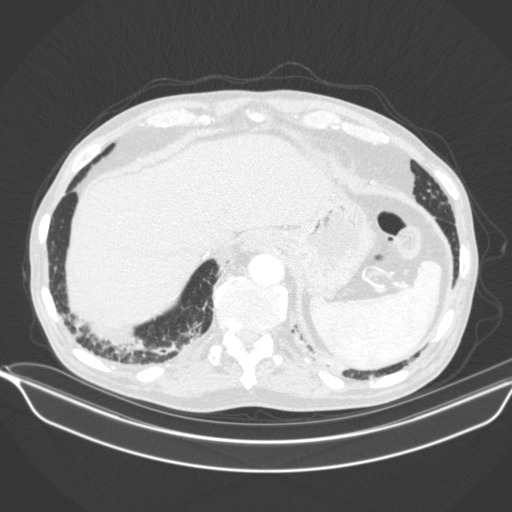
[im 146/436  soft-tissue]
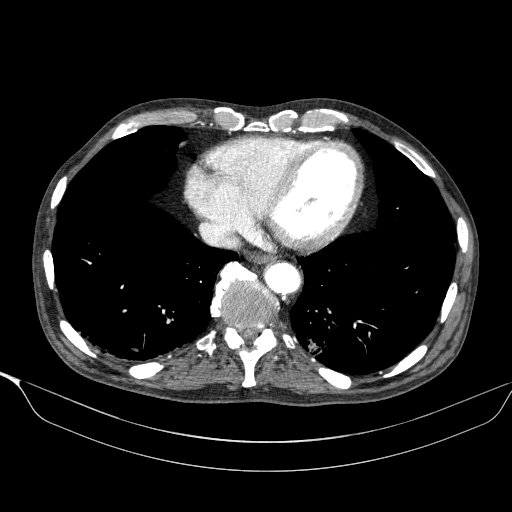
[im 164/436  lung]
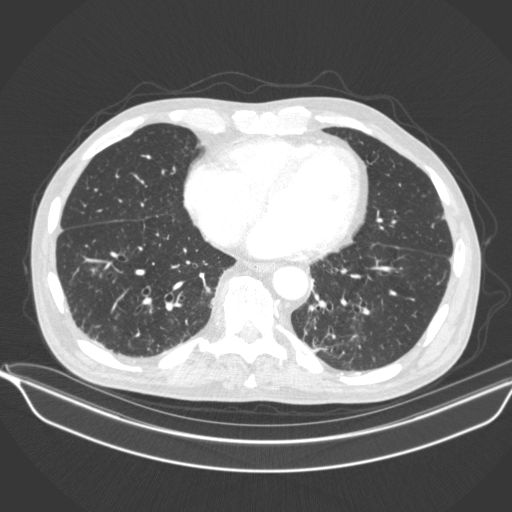
[im 182/436  soft-tissue]
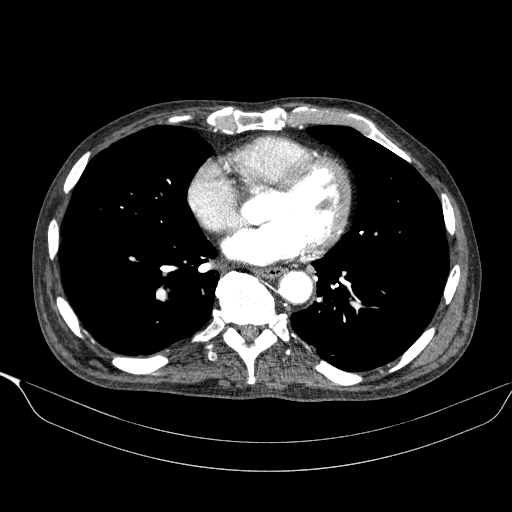
[im 200/436  lung]
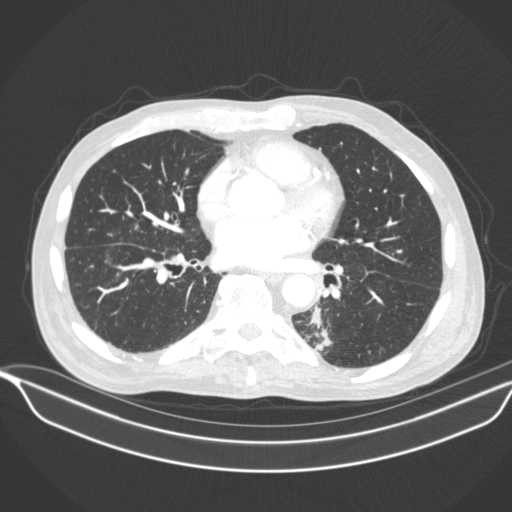
[im 236/436  soft-tissue]
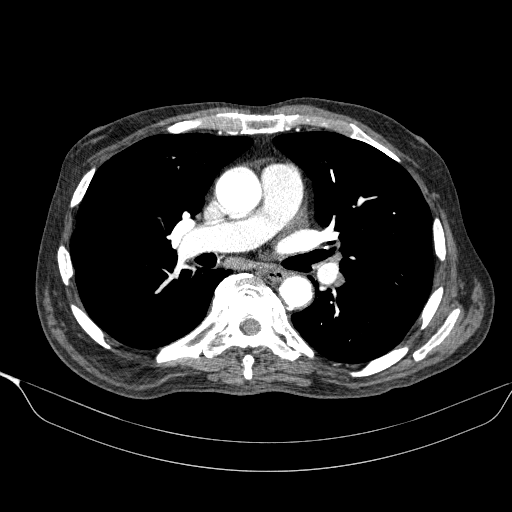
[im 254/436  lung]
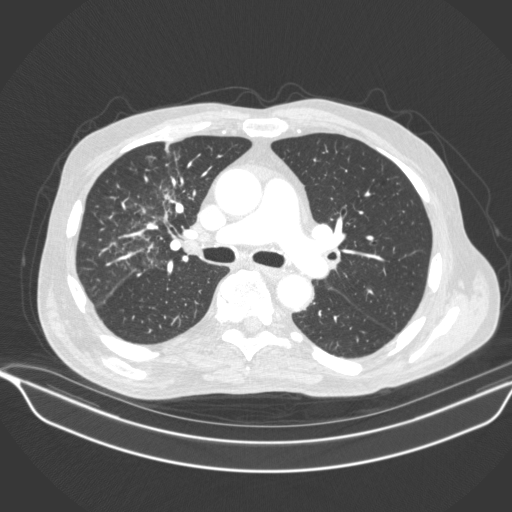
[im 272/436  soft-tissue]
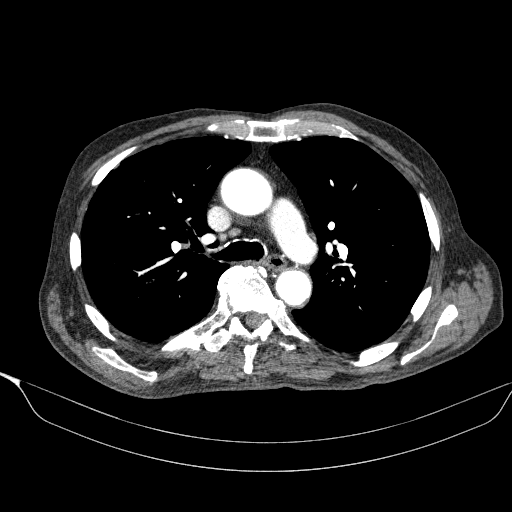
[im 291/436  lung]
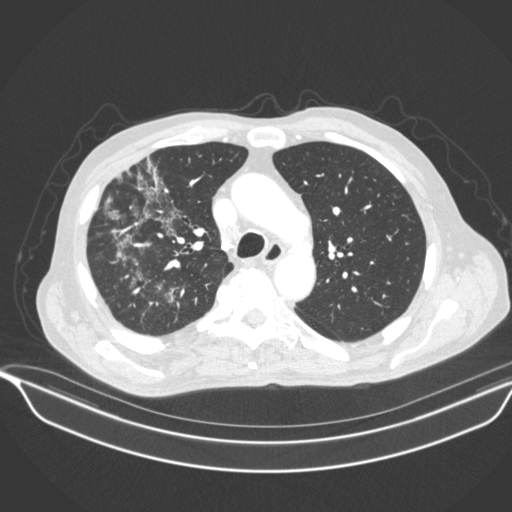
[im 327/436  soft-tissue]
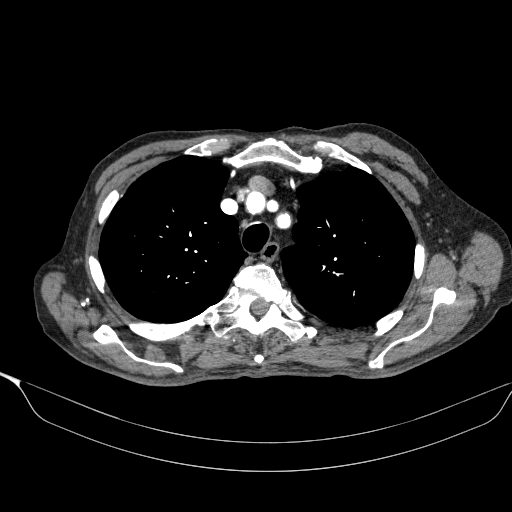
[im 345/436  lung]
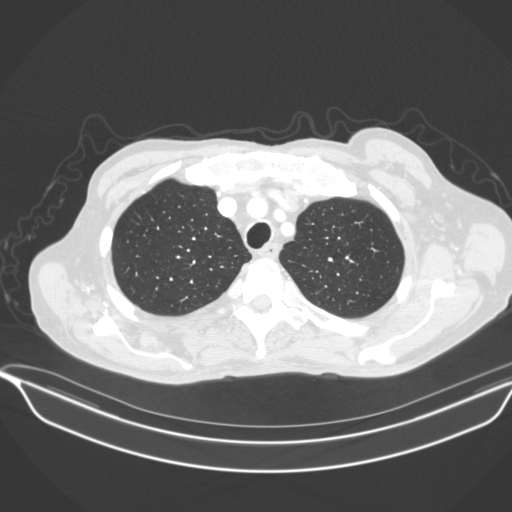
[im 363/436  soft-tissue]
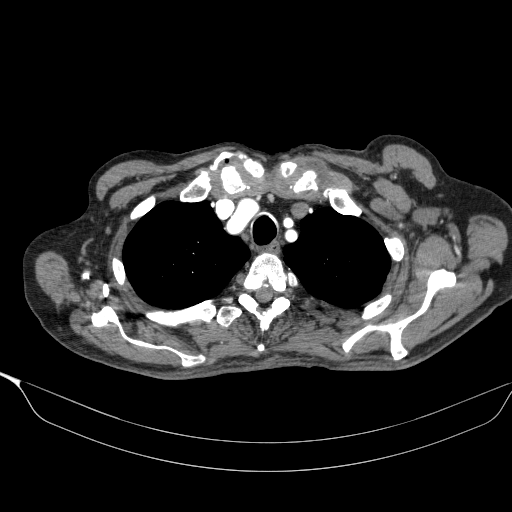
[im 399/436  lung]
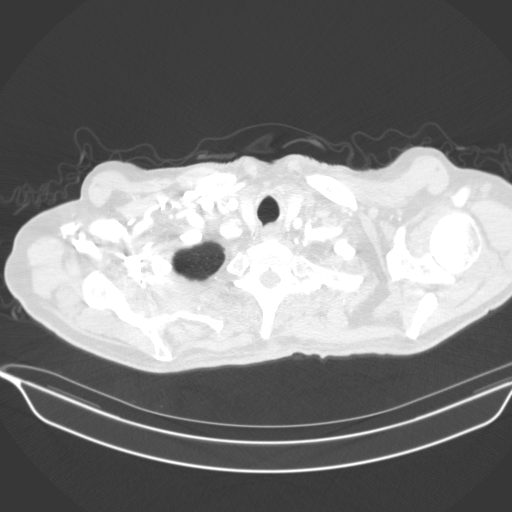
[im 417/436  soft-tissue]
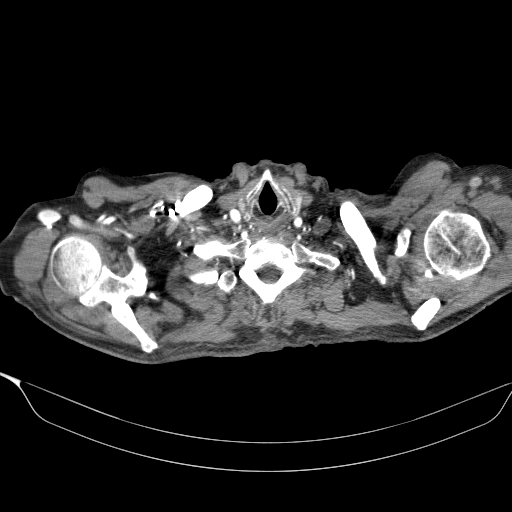

[18 of 32 positions shown; findings below may reference images not displayed]

FINDINGS: Cardiovascular: Preferential opacification of the thoracic aorta.
There is moderate mixed calcified and noncalcified atherosclerotic
plaque throughout the thoracic aorta. There is a small penetrating
atherosclerotic ulcer along the anterior aspect of the aortic arch
distal to the takeoff of the left subclavian artery. This measures
approximately 1.2 cm in width, previously measuring 1.2 cm (series
4, image 39). There is no thoracic aortic aneurysm or dissection.
There is atherosclerotic disease at the takeoff of the arch great
vessels but no significant stenosis. There is focal kinking of the
left axillary artery without occlusion. There is severe stenosis of
the celiac trunk at its takeoff from the aorta. The superior
mesenteric artery ostia is patent.

Mediastinum/Nodes: Prominent bilateral hilar lymph nodes, likely
reactive. The thyroid is unremarkable. The esophagus is
unremarkable.

Lungs/Pleura: There is new ground-glass opacities and reticulation
in the right upper lobe and to a lesser degree the right middle and
lower lobes. New consolidation and ground-glass opacities in the
left lower lobe. No suspicious pulmonary nodules or masses.

Upper Abdomen: Prior cholecystectomy.

Musculoskeletal: No acute osseous abnormality. No suspicious lytic
or blastic lesions. Multilevel degenerative changes of the spine.

Review of the MIP images confirms the above findings.
IMPRESSION: Small penetrating atherosclerotic ulcer along the anterior aspect of
the aortic arch, distal to the takeoff of the left subclavian
artery, stable since prior CT in [DATE]. No evidence of thoracic
aortic aneurysm or dissection.

Severe stenosis of the celiac trunk at its takeoff from the aorta,
similar to prior CT.

Multifocal pneumonia involving the right upper, middle, and lower
lobes as well as the left lower lobe. Per medical record, patient
was recently hospitalized with community-acquired pneumonia and RSV
infection. CT findings are concordant with this diagnosis. Recommend
follow-up chest CT in 3-6 months to ensure resolution.

## 2021-03-18 MED ORDER — IOPAMIDOL (ISOVUE-370) INJECTION 76%
75.0000 mL | Freq: Once | INTRAVENOUS | Status: AC | PRN
  Administered 2021-03-18: 75 mL via INTRAVENOUS

## 2021-03-19 ENCOUNTER — Institutional Professional Consult (permissible substitution): Payer: Medicare PPO | Admitting: Surgery

## 2021-03-19 ENCOUNTER — Encounter: Payer: Self-pay | Admitting: Surgery

## 2021-03-19 ENCOUNTER — Other Ambulatory Visit: Payer: Self-pay

## 2021-03-19 VITALS — BP 119/53 | HR 100 | Resp 20 | Ht 74.0 in | Wt 179.0 lb

## 2021-03-19 DIAGNOSIS — I719 Aortic aneurysm of unspecified site, without rupture: Secondary | ICD-10-CM | POA: Diagnosis not present

## 2021-03-19 NOTE — Progress Notes (Signed)
Cardiothoracic Surgery Consultation  PCP is [name], MD Referring Provider is Nigel Mormon, MD  Chief Complaint  Patient presents with   Consult    F/u from hospital discharge, CTA Chest  03/18/21    HPI:  The patient is an 81 year old gentleman with a history of type 2 diabetes, hypertension, hyperlipidemia, congestive heart failure, stage III chronic kidney disease, coronary artery disease status post stenting of the LAD and ramus in July 2022, and osteoarthritis with lumbar stenosis and radiculopathy who was recently hospitalized from 12/28 to 03/14/2021 with severe sepsis due to community-acquired pneumonia and RSV infection.  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  The scan also showed multifocal pneumonia involving the right upper, middle, and lower lobes as well as the left lower lobe.  He said that he is feeling much better.  He is scheduled for back surgery tomorrow.  There is no family history of aortic aneurysm or aortic dissection. Past Medical History:  Diagnosis Date   Anemia    as an infant   Arthritis    Back pain    Depression    Disc displacement, lumbar    Gynecomastia    History of kidney stones    Hyperlipidemia    Hypertension    no longer on medications   Hypertriglyceridemia    Insomnia    Low back pain    Lumbar radiculopathy    Lumbar stenosis    OA (osteoarthritis)    Polyneuropathy in diabetes(357.2)    Prostate cancer ([place]) 2003   Restless legs    Ringing in ears    RLS (restless legs syndrome)    Type 2 diabetes mellitus ([place])     Past Surgical History:  Procedure Laterality Date   ABDOMINAL  EXPOSURE N/A 07/13/2019   Procedure: ABDOMINAL EXPOSURE;  Surgeon: Serafina Mitchell, MD;  Location: [place];  Service: Vascular;  Laterality: N/A;  anterior approach   ANTERIOR LAT LUMBAR FUSION Right 07/13/2019   Procedure: Right Lumbar 3-4 Lumbar 4-5 Anterolateral lumbar interbody fusion;  Surgeon: Erline Levine, MD;  Location: [place];  Service: Neurosurgery;  Laterality: Right;   ANTERIOR LUMBAR FUSION N/A 07/13/2019   Procedure: Lumbar Five-Sacral One Anterior lumbar interbody fusion;  Surgeon: Erline Levine, MD;  Location: [place];  Service: Neurosurgery;  Laterality: N/A;  Anterior approach   CHOLECYSTECTOMY     COLONOSCOPY     CORONARY STENT INTERVENTION N/A 10/01/2020   Procedure: CORONARY STENT INTERVENTION;  Surgeon: Nigel Mormon, MD;  Location: [facility];  Service: Cardiovascular;  Laterality: N/A;   EYE SURGERY Bilateral    GALLBLADDER SURGERY     HEMORRHOID SURGERY  1983   LEFT HEART CATH AND CORONARY ANGIOGRAPHY N/A 09/17/2020   Procedure: LEFT HEART CATH AND CORONARY ANGIOGRAPHY;  Surgeon: Nigel Mormon, MD;  Location: [facility];  Service: Cardiovascular;  Laterality: N/A;   LUMBAR LAMINECTOMY/DECOMPRESSION MICRODISCECTOMY N/A 10/02/2016   Procedure: L3 to S1 Laminectomy;  Surgeon: Erline Levine, MD;  Location: [facility];  Service: Neurosurgery;  Laterality: N/A;  L3 to S1 Laminectomy   LUMBAR LAMINECTOMY/DECOMPRESSION MICRODISCECTOMY Bilateral  09/07/2019   Procedure: Bilateral Lumbar Five - Sacral One Foraminotomy;  Surgeon: Erline Levine, MD;  Location: [place];  Service: Neurosurgery;  Laterality: Bilateral;  posterior   LUMBAR PERCUTANEOUS PEDICLE SCREW 3 LEVEL N/A 07/13/2019   Procedure: Percutaneous pedicle screw fixation from Lumbar 3 to Sacral 1;  Surgeon: Erline Levine, MD;  Location: [place];  Service: Neurosurgery;  Laterality: N/A;   MENISCUS REPAIR Right    PROSTATE SURGERY     SHOULDER SURGERY Left 01/2011   rotator cuff    Family History  Problem  Relation Age of Onset   Arthritis Mother    Hypertension Mother    Diabetes Mother    Heart attack Mother    Cancer Father     Social History Social History   Tobacco Use   Smoking status: Former    Types: Cigars    Quit date: 2022    Years since quitting: 1.0   Smokeless tobacco: Never   Tobacco comments:    occasional  Vaping Use   Vaping Use: Never used  Substance Use Topics   Alcohol use: Not Currently    Comment: social-beer   Drug use: No    Current Outpatient Medications  Medication Sig Dispense Refill   azithromycin (ZITHROMAX) 250 MG tablet Take 1 tablet (250 mg total) by mouth daily. 2 each 0   cefdinir (OMNICEF) 300 MG capsule Take 1 capsule (300 mg total) by mouth 2 (two) times daily. 6 capsule 0   clopidogrel (PLAVIX) 75 MG tablet Take 1 tablet (75 mg total) by mouth daily. 90 tablet 3   Dulaglutide (TRULICITY) 1.5 JM/4.2AS SOPN Inject 1.5 mg into the skin every Wednesday.     DULoxetine (CYMBALTA) 30 MG capsule Take 30 mg by mouth daily.     FARXIGA 10 MG TABS tablet Take 10 mg by mouth daily.      gabapentin (NEURONTIN) 600 MG tablet Take 600 mg by mouth 2 (two) times daily.     metFORMIN (GLUCOPHAGE) 500 MG tablet Take 2 tablets (1,000 mg total) by mouth 2 (two) times daily with a meal. Resume 10/03/2020  1   metoprolol succinate (TOPROL-XL) 25 MG 24 hr tablet TAKE 1 TABLET(25 MG) BY MOUTH DAILY 90 tablet 0   polyethylene glycol powder (GLYCOLAX/MIRALAX) 17 GM/SCOOP powder See admin instructions.     rOPINIRole (REQUIP) 4 MG tablet Take 4 mg by mouth at bedtime.     [START ON 03/21/2021] rosuvastatin (CRESTOR) 40 MG tablet Take 1 tablet (40 mg total) by mouth daily. 90 tablet 3   vitamin B-12 (CYANOCOBALAMIN) 500 MCG tablet Take 500 mcg by mouth daily.     nitroGLYCERIN (NITROSTAT) 0.4 MG SL tablet Place 1 tablet (0.4 mg total) under the tongue every 5 (five) minutes as needed for chest pain. 30 tablet 3   No current facility-administered medications for this  visit.    Allergies  Allergen Reactions   Atorvastatin Other (See Comments)   Levaquin [Levofloxacin In D5w] Rash   Niaspan [Niacin Er] Rash and Other (See Comments)    Flushing reaction   Tramadol Rash    Review of Systems  Constitutional:  Negative for activity change, chills, fatigue and fever.  HENT: Negative.    Eyes: Negative.   Respiratory:  Positive for cough. Negative for shortness of breath.   Cardiovascular:  Negative for chest pain and leg swelling.  Gastrointestinal: Negative.   Endocrine: Negative.   Genitourinary: Negative.   Musculoskeletal: Negative.   Skin: Negative.  Neurological:  Positive for numbness. Negative for dizziness and syncope.  Hematological: Negative.   Psychiatric/Behavioral: Negative.     BP (!) 119/53    Pulse 100    Resp 20    Ht 6\' 2"  (1.88 m)    Wt 179 lb (81.2 kg)    SpO2 95% Comment: RA   BMI 22.98 kg/m  Physical Exam Constitutional:      Appearance: Normal appearance. He is normal weight.  HENT:     Head: Normocephalic and atraumatic.  Eyes:     Extraocular Movements: Extraocular movements intact.     Conjunctiva/sclera: Conjunctivae normal.     Pupils: Pupils are equal, round, and reactive to light.  Cardiovascular:     Rate and Rhythm: Normal rate and regular rhythm.     Pulses: Normal pulses.     Heart sounds: Normal heart sounds. No murmur heard. Pulmonary:     Effort: Pulmonary effort is normal.     Comments: Crackles over lower lobes Abdominal:     General: Abdomen is flat. Bowel sounds are normal. There is no distension.     Palpations: Abdomen is soft.     Tenderness: There is no abdominal tenderness.  Musculoskeletal:        General: No swelling. Normal range of motion.     Cervical back: Normal range of motion and neck supple.  Skin:    General: Skin is warm and dry.  Neurological:     General: No focal deficit present.     Mental Status: He is alert and oriented to person, place, and time.  Psychiatric:         Mood and Affect: Mood normal.        Behavior: Behavior normal.     Diagnostic Tests:  Narrative & Impression  CLINICAL DATA:  penetrating ulcer   EXAM: CT ANGIOGRAPHY CHEST WITH CONTRAST   TECHNIQUE: Multidetector CT imaging of the chest was performed using the standard protocol during bolus administration of intravenous contrast. Multiplanar CT image reconstructions and MIPs were obtained to evaluate the vascular anatomy.   CONTRAST:  63mL ISOVUE-370 IOPAMIDOL (ISOVUE-370) INJECTION 76%   COMPARISON:  CT 09/12/2020, radiograph 03/12/2021   FINDINGS: Cardiovascular: Preferential opacification of the thoracic aorta. There is moderate mixed calcified and noncalcified atherosclerotic plaque throughout the thoracic aorta. 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 This measures approximately 1.2 cm in width, previously measuring 1.2 cm (series 4, image 39). There is no thoracic aortic aneurysm or dissection. There is atherosclerotic disease at the takeoff of the arch great vessels but no significant stenosis. There is focal kinking of the left axillary artery without occlusion. There is severe stenosis of the celiac trunk at its takeoff from the aorta. The superior mesenteric artery ostia is patent.   Mediastinum/Nodes: Prominent bilateral hilar lymph nodes, likely reactive. The thyroid is unremarkable. The esophagus is unremarkable.   Lungs/Pleura: There is new ground-glass opacities and reticulation in the right upper lobe and to a lesser degree the right middle and lower lobes. New consolidation and ground-glass opacities in the left lower lobe. No suspicious pulmonary nodules or masses.   Upper Abdomen: Prior cholecystectomy.   Musculoskeletal: No acute osseous abnormality. No suspicious lytic or blastic lesions. Multilevel degenerative changes of the  spine.   Review of the MIP images confirms the above findings.   IMPRESSION: Small penetrating atherosclerotic ulcer along the anterior aspect  of the aortic arch, distal to the takeoff of the left subclavian artery, stable since prior CT in June 2022. No evidence of thoracic aortic aneurysm or dissection.   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   Multifocal pneumonia involving the right upper, middle, and lower lobes as well as the left lower lobe. Per medical record, patient was recently hospitalized with community-acquired pneumonia and RSV infection. CT findings are concordant with this diagnosis. Recommend follow-up chest CT in 3-6 months to ensure resolution.     Electronically Signed   By: Maurine Simmering M.D.   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  It is unchanged compared to a CTA of the chest on 09/12/2020.  He has had no prior studies before that.  There is no indication for surgical treatment of this ulcer at this time.  I reviewed the images with him and his wife and daughter and answered their questions.  I recommended continued follow-up with a repeat CTA of the chest in 6 months.  I stressed the importance of continued good blood pressure control in preventing further enlargement, rupture, and aortic dissection.  There is no contraindication to back surgery as far as the penetrating aortic ulcer is concerned but he does have bilateral diffuse airspace disease on the CTA done yesterday related to his recent pneumonia and RSV infection and I think it would be best to put off his back surgery until this has completely resolved.  He is going to see his back surgeon later today and will discuss this with him.  Plan:  I will see him back in 6 months with a CTA of the chest.  I spent 40  minutes performing this consultation and > 50% of this time was spent face to face counseling and coordinating the care of this patient's penetrating aortic arch ulcer.   Gaye Pollack, MD [facility] [phone]

## 2021-03-24 ENCOUNTER — Other Ambulatory Visit (HOSPITAL_COMMUNITY): Payer: Self-pay

## 2021-03-24 ENCOUNTER — Other Ambulatory Visit: Payer: Self-pay | Admitting: Endocrinology

## 2021-03-24 ENCOUNTER — Other Ambulatory Visit (HOSPITAL_COMMUNITY): Payer: Self-pay | Admitting: Endocrinology

## 2021-03-24 ENCOUNTER — Ambulatory Visit (HOSPITAL_COMMUNITY)
Admission: RE | Admit: 2021-03-24 | Discharge: 2021-03-24 | Disposition: A | Discharge status: Home / Self Care | Payer: Medicare PPO | Source: Ambulatory Visit | Attending: Endocrinology | Admitting: Endocrinology

## 2021-03-24 ENCOUNTER — Other Ambulatory Visit: Payer: Self-pay

## 2021-03-24 DIAGNOSIS — R1031 Right lower quadrant pain: Secondary | ICD-10-CM | POA: Diagnosis present

## 2021-03-24 DIAGNOSIS — R1032 Left lower quadrant pain: Secondary | ICD-10-CM | POA: Diagnosis present

## 2021-03-24 IMAGING — CT CT ABD-PELV W/ CM
2 of 5 series · 15 of 46 positions shown, 17 images · IV contrast (APPLIED)
Comparison: [DATE]

CLINICAL DATA: Right lower quadrant abdominal pain

EXAM:
CT ABDOMEN AND PELVIS WITH CONTRAST
TECHNIQUE: Multidetector CT imaging of the abdomen and pelvis was performed
using the standard protocol following bolus administration of
intravenous contrast.
CONTRAST:  80mL OMNIPAQUE IOHEXOL 350 MG/ML SOLN

[Series 2: axial st · axial · 0.83mm/px · z∈[-526,-71]mm · 12 of 105 slices shown, 14 images]
[im 7/105  soft-tissue]
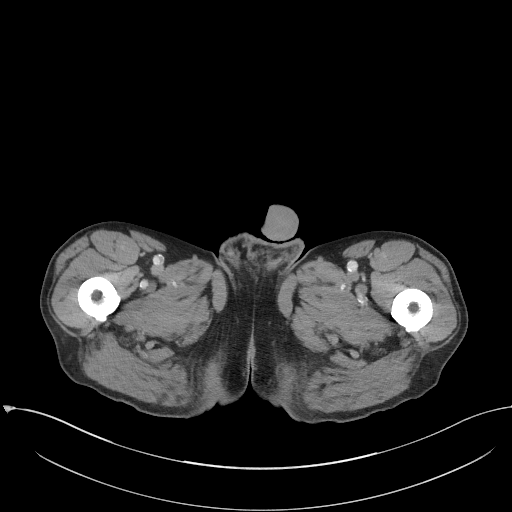
[im 7/105  bone]
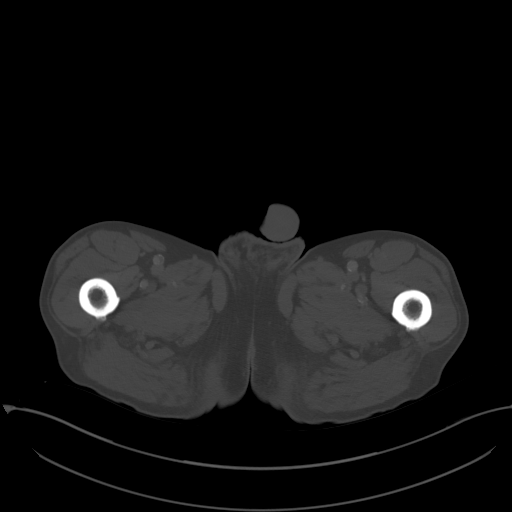
[im 14/105  soft-tissue]
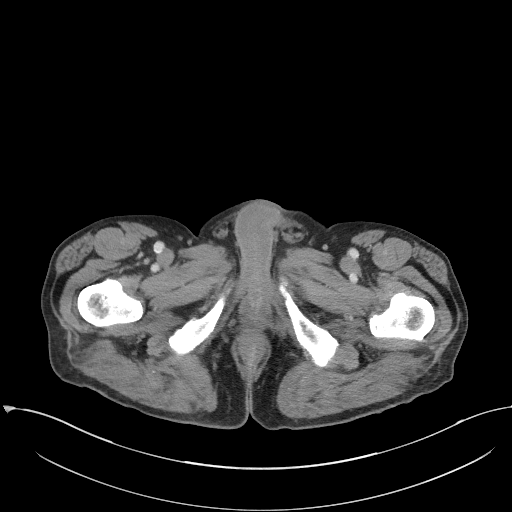
[im 21/105  soft-tissue]
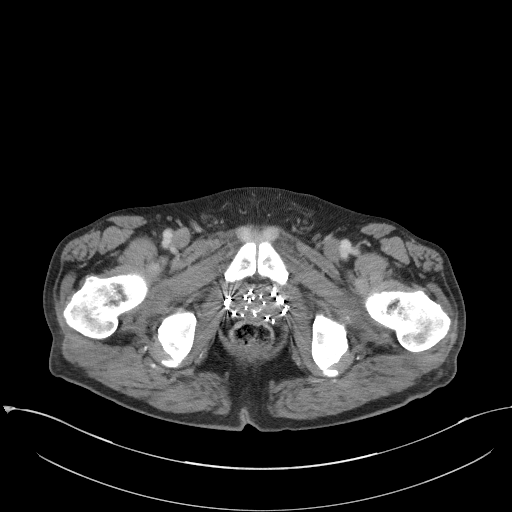
[im 35/105  soft-tissue]
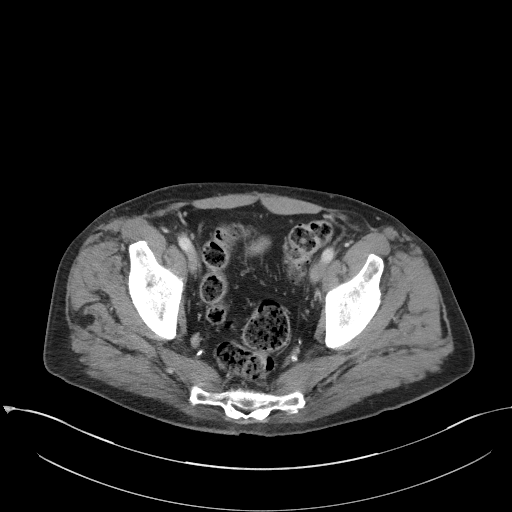
[im 42/105  soft-tissue]
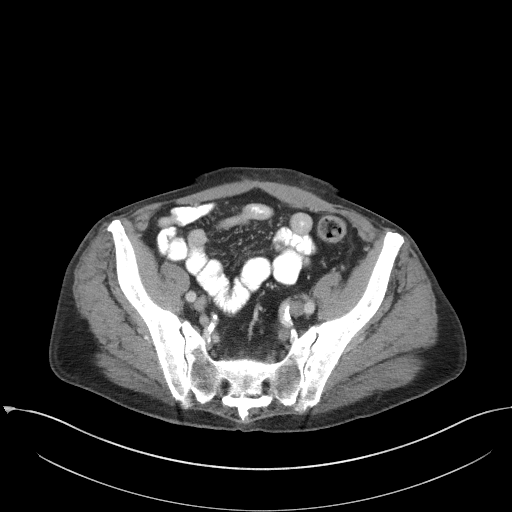
[im 49/105  soft-tissue]
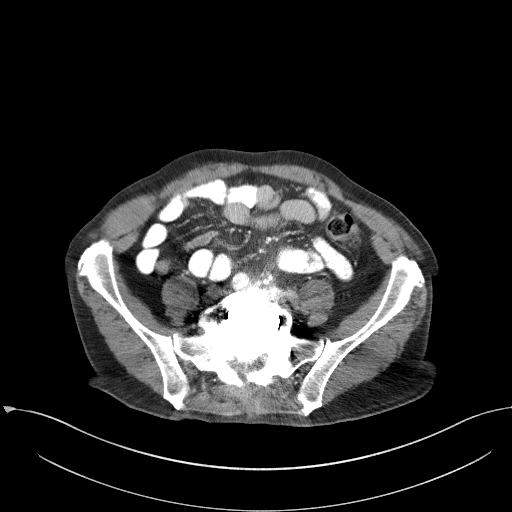
[im 56/105  soft-tissue]
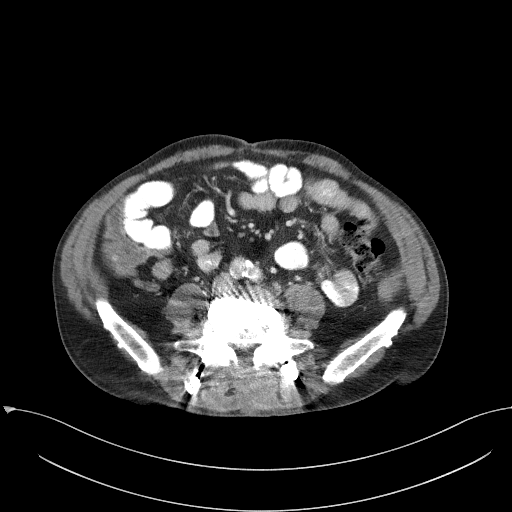
[im 63/105  soft-tissue]
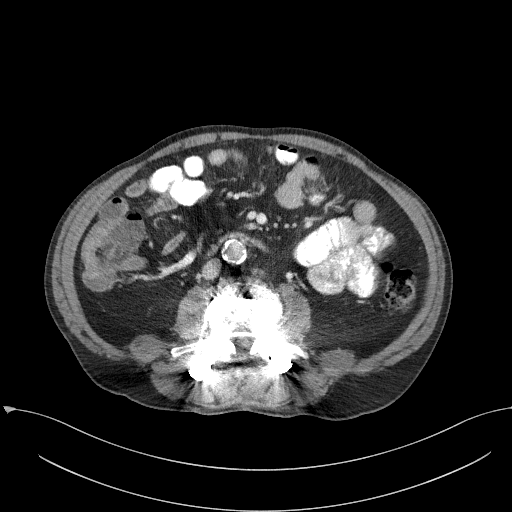
[im 70/105  soft-tissue]
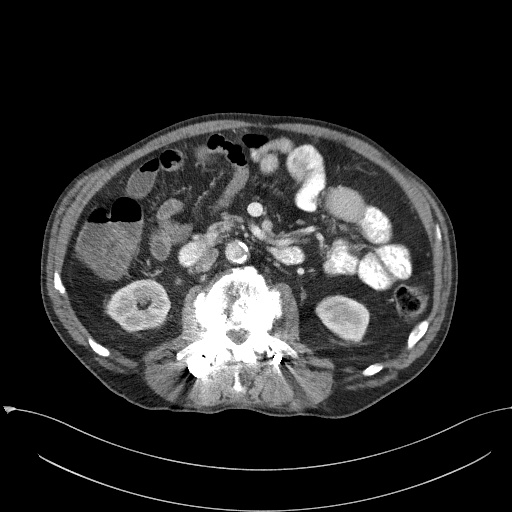
[im 70/105  bone]
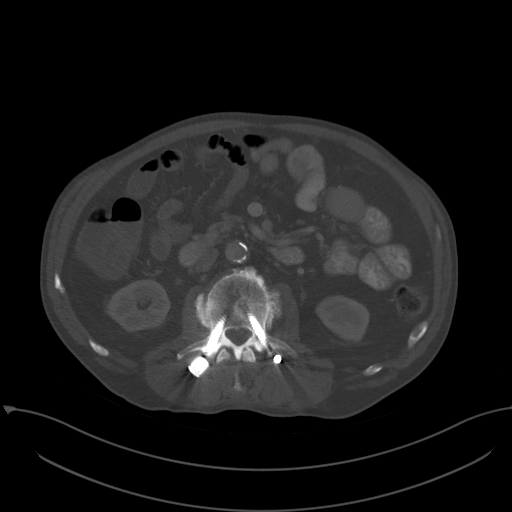
[im 84/105  soft-tissue]
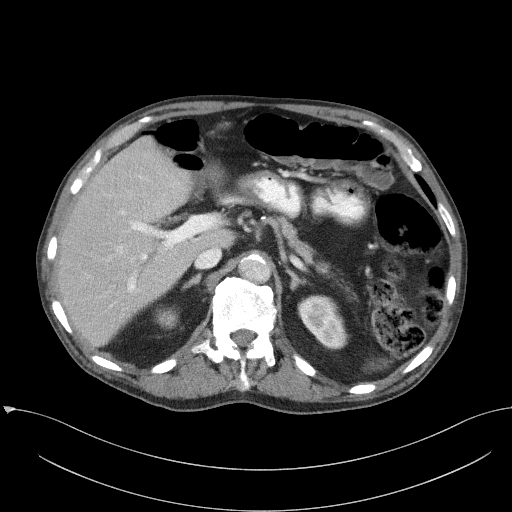
[im 91/105  soft-tissue]
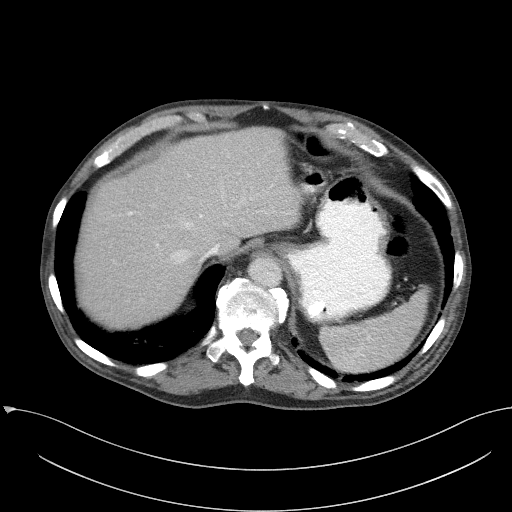
[im 98/105  soft-tissue]
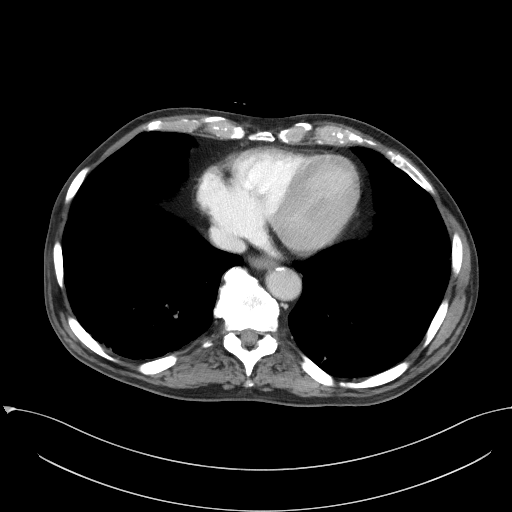

[Series 4: coronal st · coronal · 0.86mm/px · 3 of 94 slices shown]
[im 32/94  soft-tissue]
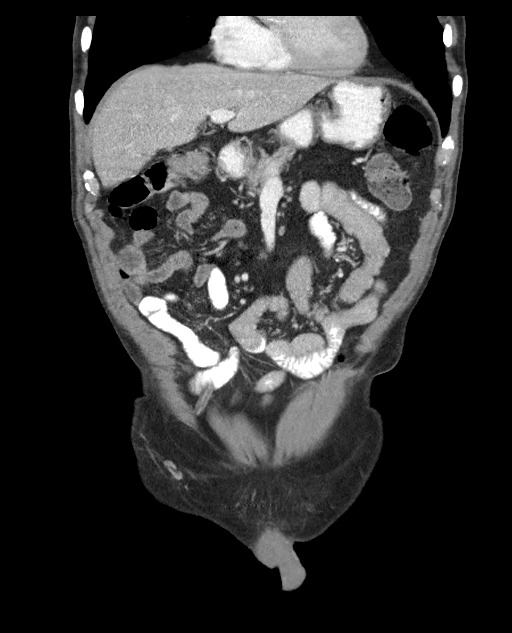
[im 42/94  soft-tissue]
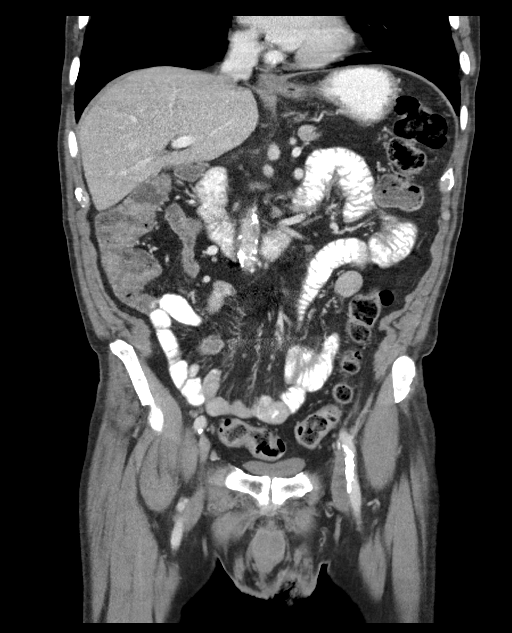
[im 52/94  soft-tissue]
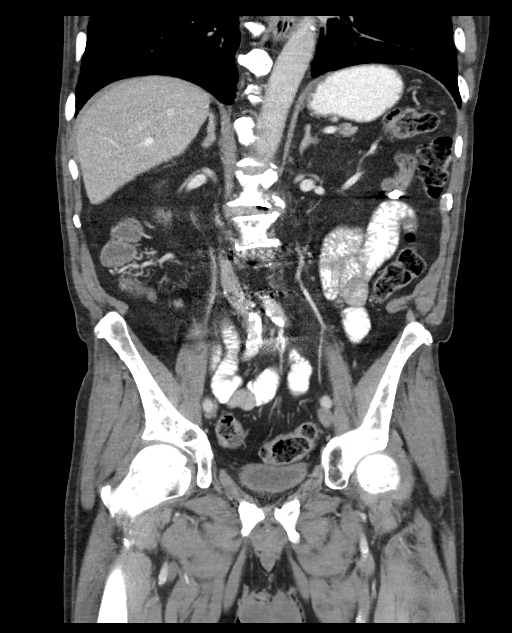

[15 of 46 positions shown; findings below may reference images not displayed]

FINDINGS: Lower chest: Bibasilar atelectasis with more patchy airspace
consolidation in the dependent aspect of the left lower lobe
suspicious for pneumonia. Heart size within normal limits.

Hepatobiliary: No focal liver abnormality is seen. Status post
cholecystectomy. No biliary dilatation.

Pancreas: Unremarkable. No pancreatic ductal dilatation or
surrounding inflammatory changes.

Spleen: Normal in size without focal abnormality.

Adrenals/Urinary Tract: Unremarkable adrenal glands. Kidneys enhance
symmetrically without focal lesion, stone, or hydronephrosis.
Ureters are nondilated. Mild circumferential urinary bladder wall
thickening, which may be accentuated by under-distension.

Stomach/Bowel: Stomach within normal limits. No dilated loops of
bowel. Normal appendix in the right lower quadrant (series 2, image
50). There is a ovoid 11 x 8 mm metallic density within the colonic
lumen at the distal transverse colon (series 2, image 32).
Occasional scattered left-sided colonic diverticula. No focal bowel
wall thickening or inflammatory changes.

Vascular/Lymphatic: Aortic atherosclerosis. No enlarged abdominal or
pelvic lymph nodes.

Reproductive: Brachytherapy seeds within the prostate gland.

Other: No free fluid. No abdominopelvic fluid collection. No
pneumoperitoneum. No abdominal wall hernia.

Musculoskeletal: Postsurgical changes within the lumbar spine and
sacrum. Progressive degenerative disc disease at the L2-3 level with
vacuum disc phenomenon. Endplate irregularity at this level is new
from prior. No paravertebral inflammatory changes.
IMPRESSION: 1. No acute abdominopelvic findings. Normal appendix.
2. Mild circumferential urinary bladder wall thickening, which may
be accentuated by under-distension. Correlate with urinalysis to
exclude cystitis.
3. Bibasilar atelectasis with more patchy airspace consolidation in
the dependent aspect of the left lower lobe suspicious for
pneumonia.
4. Ovoid 11 x 8 mm metallic density within the colonic lumen at the
distal transverse colon, correlate for history of ingested foreign
body.
5. Progressive degenerative disc disease at the L2-3 level with new
endplate irregularity. Findings are favored to represent
degenerative adjacent segment disease. The presence of vacuum disc
phenomenon and lack of paravertebral inflammatory changes argues
against discitis. Consider correlation with serum inflammatory
markers.

Aortic Atherosclerosis ([DC]-[DC]).

## 2021-03-24 MED ORDER — IOHEXOL 9 MG/ML PO SOLN: 500.0000 mL | ORAL | Status: DC

## 2021-03-24 MED ORDER — IOHEXOL 9 MG/ML PO SOLN
1000.0000 mL | ORAL | Status: AC
  Administered 2021-03-24: 1000 mL via ORAL

## 2021-03-24 MED ORDER — SODIUM CHLORIDE (PF) 0.9 % IJ SOLN
INTRAMUSCULAR | Status: AC
  Filled 2021-03-24: qty 50

## 2021-03-24 MED ORDER — IOHEXOL 350 MG/ML SOLN
80.0000 mL | Freq: Once | INTRAVENOUS | Status: AC | PRN
  Administered 2021-03-24: 80 mL via INTRAVENOUS

## 2021-03-24 MED ORDER — TRULICITY 3 MG/0.5ML ~~LOC~~ SOAJ
3.0000 mg | SUBCUTANEOUS | 3 refills | Status: DC
  Filled 2021-03-24: qty 2, 28d supply, fill #0

## 2021-03-24 MED ORDER — IOHEXOL 9 MG/ML PO SOLN
ORAL | Status: AC
  Filled 2021-03-24: qty 1000

## 2021-03-25 ENCOUNTER — Other Ambulatory Visit (HOSPITAL_COMMUNITY): Payer: Self-pay

## 2021-03-25 MED ORDER — TRULICITY 3 MG/0.5ML ~~LOC~~ SOAJ
3.0000 mg | SUBCUTANEOUS | 3 refills | Status: DC
  Filled 2021-03-25: qty 6, 84d supply, fill #0

## 2021-03-25 MED ORDER — TRULICITY 3 MG/0.5ML ~~LOC~~ SOAJ
3.0000 mg | SUBCUTANEOUS | 3 refills | Status: DC
  Filled 2021-03-25: qty 6, 84d supply, fill #0
  Filled 2021-04-21: qty 2, 28d supply, fill #0
  Filled 2021-06-06: qty 2, 28d supply, fill #1
  Filled 2021-10-15: qty 2, 28d supply, fill #2
  Filled 2021-11-22: qty 2, 28d supply, fill #3
  Filled 2021-11-22: qty 2, 28d supply, fill #0
  Filled 2021-12-24: qty 2, 28d supply, fill #1
  Filled 2022-01-30: qty 2, 28d supply, fill #2
  Filled 2022-02-26: qty 2, 28d supply, fill #3

## 2021-03-29 ENCOUNTER — Other Ambulatory Visit: Payer: Self-pay | Admitting: Physician Assistant

## 2021-04-18 ENCOUNTER — Ambulatory Visit: Payer: Medicare PPO | Admitting: Cardiology

## 2021-04-21 ENCOUNTER — Other Ambulatory Visit (HOSPITAL_COMMUNITY): Payer: Self-pay

## 2021-04-21 ENCOUNTER — Ambulatory Visit: Payer: Medicare PPO | Admitting: Cardiology

## 2021-04-21 ENCOUNTER — Encounter: Payer: Self-pay | Admitting: Cardiology

## 2021-04-21 ENCOUNTER — Other Ambulatory Visit: Payer: Self-pay

## 2021-04-21 VITALS — BP 143/74 | HR 76 | Temp 97.8°F | Resp 16 | Ht 74.0 in | Wt 172.0 lb

## 2021-04-21 DIAGNOSIS — I25118 Atherosclerotic heart disease of native coronary artery with other forms of angina pectoris: Secondary | ICD-10-CM

## 2021-04-21 DIAGNOSIS — I739 Peripheral vascular disease, unspecified: Secondary | ICD-10-CM

## 2021-04-21 NOTE — Pre-Procedure Instructions (Addendum)
Surgical Instructions    Your procedure is scheduled on Thursday, February 9th.  Report to [facility] Main Entrance "A" at 5:30 A.M., then check in with the Admitting office.  Call this number if you have problems the morning of surgery:  [phone]   If you have any questions prior to your surgery date call [phone]: Open Monday-Friday 8am-4pm    Remember:  Do not eat after midnight the night before your surgery  You may drink clear liquids until 4:30 a.m. the morning of your surgery.   Clear liquids allowed are: Water, Non-Citrus Juices (without pulp), Carbonated Beverages, Clear Tea, Black Coffee Only (NO MILK, CREAM OR POWDERED CREAMER of any kind), and Gatorade.    Take these medicines the morning of surgery with A SIP OF WATER  azithromycin (ZITHROMAX)  buPROPion (WELLBUTRIN SR) gabapentin (NEURONTIN)  metoprolol succinate (TOPROL-XL)  rosuvastatin (CRESTOR)  nitroGLYCERIN (NITROSTAT)-if needed. Please let a nurse know if you had to use this.   Per your cardiologst, HOLD clopidogrel (PLAVIX) 5 days prior to surgery.  As of today, STOP taking any Aspirin (unless otherwise instructed by your surgeon) Aleve, Naproxen, Ibuprofen, Motrin, Advil, Goody's, BC's, all herbal medications, fish oil, and all vitamins.           WHAT DO I DO ABOUT MY DIABETES MEDICATION?  Do not take FARXIGA on Wednesday (2/8) or Thursday (2/9).   Do not take metFORMIN (GLUCOPHAGE) or FARXIGA the morning of surgery.  The day of surgery, do not take other diabetes injectables, Trulicity (dulaglutide).   HOW TO MANAGE YOUR DIABETES BEFORE AND AFTER SURGERY  Why is it important to control my blood sugar before and after surgery? Improving blood sugar levels before and after surgery helps healing and can limit problems. A way of improving blood sugar control is eating a healthy diet by:  Eating less sugar and carbohydrates  Increasing activity/exercise  Talking with your doctor about reaching  your blood sugar goals High blood sugars (greater than 180 mg/dL) can raise your risk of infections and slow your recovery, so you will need to focus on controlling your diabetes during the weeks before surgery. Make sure that the doctor who takes care of your diabetes knows about your planned surgery including the date and location.  How do I manage my blood sugar before surgery? Check your blood sugar at least 4 times a day, starting 2 days before surgery, to make sure that the level is not too high or low.  Check your blood sugar the morning of your surgery when you wake up and every 2 hours until you get to the Short Stay unit.  If your blood sugar is less than 70 mg/dL, you will need to treat for low blood sugar: Do not take insulin. Treat a low blood sugar (less than 70 mg/dL) with  cup of clear juice (cranberry or apple), 4 glucose tablets, OR glucose gel. Recheck blood sugar in 15 minutes after treatment (to make sure it is greater than 70 mg/dL). If your blood sugar is not greater than 70 mg/dL on recheck, call [phone] for further instructions. Report your blood sugar to the short stay nurse when you get to Short Stay.  If you are admitted to the hospital after surgery: Your blood sugar will be checked by the staff and you will probably be given insulin after surgery (instead of oral diabetes medicines) to make sure you have good blood sugar levels. The goal for blood sugar control after surgery is 80-180  mg/dL.             Do NOT Smoke (Tobacco/Vaping) for 24 hours prior to your procedure.  If you use a CPAP at night, you may bring your mask/headgear for your overnight stay.   Contacts, glasses, piercing's, hearing aid's, dentures or partials may not be worn into surgery, please bring cases for these belongings.    For patients admitted to the hospital, discharge time will be determined by your treatment team.   Patients discharged the day of surgery will not be allowed to  drive home, and someone needs to stay with them for 24 hours.  NO VISITORS WILL BE ALLOWED IN PRE-OP WHERE PATIENTS ARE PREPPED FOR SURGERY.  ONLY 1 SUPPORT PERSON MAY BE PRESENT IN THE WAITING ROOM WHILE YOU ARE IN SURGERY.  IF YOU ARE TO BE ADMITTED, ONCE YOU ARE IN YOUR ROOM YOU WILL BE ALLOWED TWO (2) VISITORS. (1) VISITOR MAY STAY OVERNIGHT BUT MUST ARRIVE TO THE ROOM BY 8pm.  Minor children may have two parents present. Special consideration for safety and communication needs will be reviewed on a case by case basis.   Special instructions:   [facility]- Preparing For Surgery  Before surgery, you can play an important role. Because skin is not sterile, your skin needs to be as free of germs as possible. You can reduce the number of germs on your skin by washing with CHG (chlorahexidine gluconate) Soap before surgery.  CHG is an antiseptic cleaner which kills germs and bonds with the skin to continue killing germs even after washing.    Oral Hygiene is also important to reduce your risk of infection.  Remember - BRUSH YOUR TEETH THE MORNING OF SURGERY WITH YOUR REGULAR TOOTHPASTE  Please do not use if you have an allergy to CHG or antibacterial soaps. If your skin becomes reddened/irritated stop using the CHG.  Do not shave (including legs and underarms) for at least 48 hours prior to first CHG shower. It is OK to shave your face.  Please follow these instructions carefully.   Shower the NIGHT BEFORE SURGERY and the MORNING OF SURGERY  If you chose to wash your hair, wash your hair first as usual with your normal shampoo.  After you shampoo, rinse your hair and body thoroughly to remove the shampoo.  Use CHG Soap as you would any other liquid soap. You can apply CHG directly to the skin and wash gently with a scrungie or a clean washcloth.   Apply the CHG Soap to your body ONLY FROM THE NECK DOWN.  Do not use on open wounds or open sores. Avoid contact with your eyes, ears, mouth and  genitals (private parts). Wash Face and genitals (private parts)  with your normal soap.   Wash thoroughly, paying special attention to the area where your surgery will be performed.  Thoroughly rinse your body with warm water from the neck down.  DO NOT shower/wash with your normal soap after using and rinsing off the CHG Soap.  Pat yourself dry with a CLEAN TOWEL.  Wear CLEAN PAJAMAS to bed the night before surgery  Place CLEAN SHEETS on your bed the night before your surgery  DO NOT SLEEP WITH PETS.   Day of Surgery: Shower with CHG soap. Do not wear jewelry. Do not wear lotions, powders, colognes, or deodorant. Men may shave face and neck. Do not bring valuables to the hospital. [facility] is not responsible for any belongings or valuables. Wear Clean/Comfortable  clothing the morning of surgery Remember to brush your teeth WITH YOUR REGULAR TOOTHPASTE.   Please read over the following fact sheets that you were given.   3 days prior to your procedure or After your COVID test   You are not required to quarantine however you are required to wear a well-fitting mask when you are out and around people not in your household. If your mask becomes wet or soiled, replace with a new one.   Wash your hands often with soap and water for 20 seconds or clean your hands with an alcohol-based hand sanitizer that contains at least 60% alcohol.   Do not share personal items.   Notify your provider:  o if you are in close contact with someone who has COVID  o or if you develop a fever of 100.4 or greater, sneezing, cough, sore throat, shortness of breath or body aches.

## 2021-04-21 NOTE — Progress Notes (Signed)
Patient referred by [name], MD for syncope  Subjective:   Harry Zamora, male    DOB: 1940/08/08, 81 y.o.   MRN: [id number]   Chief Complaint  Patient presents with   syncope and collapse   Follow-up    3 months     HPI  81 y.o. Caucasian male with uncontrolled type 2 diabetes mellitus, CAD, PAD, syncope, spinal stenosis s/p prior surgeries  Patient is doing well, other than occasional lightheadedness with standing up too quickly. He denies chest pain, shortness of breath. He has had fall with lightheadedness, but denies syncope. 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  He is very active, walks for 30 minutes most days, and works in the yard regularly.  At baseline, he does not have any symptoms of chest pain, shortness of breath, claudication with exertion.  He reports occasional episodes of shortness of breath just before going to bed.  He denies any orthopnea, PND, leg edema symptoms.  Patient with had an episode of syncope about 2 weeks ago.  He woke up at 6 AM, as usual, had his coffee.  He had not had his breakfast yet.  He was sitting at dining table.  He does not recollect having any chest pain, shortness of breath, lightheadedness symptoms.  Next thing he remembers that he was down on the floor with his wife standing next to him.  His wife called his nephew, who assisted him to sit up.  The entire episode lasted for less than a minute.  On waking up, patient did not have any confusion, loss of bowel or bladder tone, tongue bite, seizures.  Patient did not have any other symptoms rest of the day.  He has not had any recurrence of similar episodes since then.  On a separate note, patient tells me that he has had spinal surgeries in the past, and was going to undergo surgery for removal of the rods today.  However, surgery was postponed given his syncope episode,  as well as uncontrolled diabetes with A1c of 9.8%.  Patient denies any claudication symptoms.  However, he has noted an area on his second toe left foot.  This started as a area of redness which he reportedly tried to pick at.  Now the area is black and discolored.  He denies any pain at the site.   Current Outpatient Medications on File Prior to Visit  Medication Sig Dispense Refill   azithromycin (ZITHROMAX) 250 MG tablet Take 1 tablet (250 mg total) by mouth daily. 2 each 0   buPROPion (WELLBUTRIN SR) 150 MG 12 hr tablet Take 1 tablet by mouth daily.     clopidogrel (PLAVIX) 75 MG tablet Take 1 tablet (75 mg total) by mouth daily. 90 tablet 3   Dulaglutide (TRULICITY) 1.5 RW/4.3XV SOPN Inject 1.5 mg into the skin every Wednesday.     FARXIGA 10 MG TABS tablet Take 10 mg by mouth daily.      gabapentin (NEURONTIN) 600 MG tablet Take 600 mg by mouth 2 (two) times daily.     meloxicam (MOBIC) 15 MG tablet Take 15 mg by mouth daily.     metFORMIN (GLUCOPHAGE) 500 MG tablet Take 2 tablets (1,000 mg total) by mouth 2 (two) times daily with a meal. Resume 10/03/2020  1   metoprolol succinate (TOPROL-XL) 25 MG 24 hr tablet TAKE 1  TABLET(25 MG) BY MOUTH DAILY 90 tablet 0   polyethylene glycol powder (GLYCOLAX/MIRALAX) 17 GM/SCOOP powder See admin instructions.     rOPINIRole (REQUIP) 4 MG tablet Take 4 mg by mouth at bedtime.     rosuvastatin (CRESTOR) 40 MG tablet Take 1 tablet (40 mg total) by mouth daily. 90 tablet 3   vitamin B-12 (CYANOCOBALAMIN) 500 MCG tablet Take 500 mcg by mouth daily.     nitroGLYCERIN (NITROSTAT) 0.4 MG SL tablet Place 1 tablet (0.4 mg total) under the tongue every 5 (five) minutes as needed for chest pain. 30 tablet 3   No current facility-administered medications on file prior to visit.    Cardiovascular and other pertinent studies:  EKG 12/13/2020: Sinus rhythm 90 bpm with brief ventricular bigeminy Old anteroseptal infarct  Carotid duplex 12/11/2020: Right  Carotid: Velocities in the right ICA are consistent with a 1-39%  stenosis.  Left Carotid: Velocities in the left ICA are consistent with a 1-39%  stenosis.  Vertebrals:  Bilateral vertebral arteries demonstrate antegrade flow.  Subclavians: Normal flow hemodynamics were seen in bilateral subclavian               arteries.   EKG 10/03/2020: Sinus rhythm 73 bpm Old anteroseptal infarct Nonspecific T-abnormality    Coronary intervention 10/01/2020: Successful percutaneous coronary intervention LAD and ramus with simultaneous T-stenting        PTCA and stent placement 3.0 X 38 mm Onyx Frontier drug-eluting stent Ostial-mid LAD        PTCA and stent placement 3.0 X 26 mm Resolute Onyx drug-eluting stent        Final kissing balloon angioplasty with 3.5X15 mm  balloon in LAD & 2.75X15 mm  balloon in ramus   LAD stenosis 90%-->0% Ramus stenosis 80%-->0% TIMI flow III-->III  Coronary angiography 09/17/2020: LM: Normal LAD: Long ostial to mid LAD 90% stenosis        Small high diag 1 patent        Subtotally occluded diag 2, collaterals from OM Ramus: Medium sized vessel with mid 70% stenosis LCx: Large,dominant. Normal RCA: Small, non-dominant. Prox 60% stenosis   LVEDP normal   Obstructive CAD could possible be a cause of his syncope, although this is only speculative. He feels tired after walking for 2 miles, although denies any specific angina symptoms.  Based on his coronary angiogram findings, perioperative cardiac risk is elevated. Will discuss with Dr. Erline Levine re: urgency and nature of his upcoming spine surgery and then discuss further recommendations re: CAD  Mobile cardiac telemetry 11 days 08/28/2020 - 09/09/2020: Dominant rhythm: Sinus. HR 57-123 bpm. Avg HR 78 bpm, while in sinus rhythm. 17 episodes of SVT, fastest at 207 bpm for 13 beats, longest for 20 beats at 140 bpm. <1% isolated SVE, couplet/triplets. 1 episode of VT, at 162 bpm for 5 beats 2.2% isolated VE,  <1% couplet No atrial fibrillation/atrial flutter/high grade AV block, sinus pause >3sec noted. 5 patient triggered events, all correlated with sinus rhythm  Exercise Tetrofosmin stress test 08/28/2020: Exercise nuclear stress test was performed using Bruce protocol. Patient reached 4.6 METS, and 93% of age predicted maximum heart rate. Exercise capacity was low. No chest pain reported. Heart rate and hemodynamic response were normal. Stress EKG showed sinus tachycardia, old anteroseptal infarct, no significant ST-T changes. SPECT images show small sized, severe intensity, reversible perfusion defect in apical anterior myocardium, with associated mild hypokinesis. Stress LVEF 47%. High risk study.  Echocardiogram 08/28/2020:    Mildly  depressed LV systolic function with EF 50%. Left ventricle cavity  is normal in size. Mild concentric hypertrophy of the left ventricle.  Normal global wall motion. Doppler evidence of grade I (impaired)  diastolic dysfunction, elevated LAP. Calculated EF 50%.  Left atrial cavity is mildly dilated by volume index.  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  Lower Extremity Arterial Duplex 08/28/2020:  No hemodynamically significant stenoses are identified in the right or the  left lower extremity arterial system.  There is monophasic waveform in the bilateral AT and PT vessels and left  PT has spectral broadening suggestive of severe diffuse disease.  This exam reveals moderately decreased perfusion of the right lower  extremity, noted at the anterior tibial artery level (ABI 0.73) and  moderately decreased perfusion of the left lower extremity, noted at the  post tibial artery level (ABI 0.73).   CT lumbar spine 06/26/2020: 1. Prior L3-S1 PLIF with fracture of the left S1 anterior screw and loosening of the right S1 anterior and posterior screws. The right posterior screw traverses the right L5-S1 facet joint. No significant interbody fusion at any  level. 2. Residual moderate right neuroforaminal stenosis at L5-S1. Asymmetric underfilling of the right S1 nerve root with suspected scar tissue surrounding the nerve root in the lateral recess 3. Progressive adjacent segment disease at L2-L3 with new moderate spinal canal and right neuroforaminal stenosis. 4. Nonobstructive right nephrolithiasis. 5. Aortic Atherosclerosis (ICD10-I70.0).     ABI 08/30/2019: Right: Velocities suggest 50-74% stenosis in the proximal PTA and 30-49%  stenosis in the proximal Peroneal a. Tibial vessel disease.  Left: No obvious evidence of stenosis. Tibial vessel disease.    Recent labs: 10/03/2020: Glucose 206, BUN/Cr 27/1.38. EGFR 51. Na/K 141/5.8.  Chol 137, TG 121, HDL 33, LDL 82  10/01/2020: Glucose 169, BUN/Cr 31/1.38. EGFR 51. Na/K 137/5.8.  H/H 12/38. MCV 97. Platelets 234  08/05/2020: Glucose 232, BUN/Cr 28/1.26. EGFR 58. Na/K 137/4.4.  H/H 13/42. MCV 96. Platelets 206 HbA1C 9.5% Lipid panel N/A TSH N/A  09/20/2019: Chol 123, TG 183, HDL 32, LDL 54   Review of Systems  Cardiovascular:  Positive for syncope. Negative for chest pain, dyspnea on exertion, leg swelling and palpitations.       Orthostatic VS for the past 72 hrs (Last 3 readings):  Orthostatic BP Patient Position BP Location Cuff Size Orthostatic Pulse  04/21/21 1132 107/62 Standing Left Arm Normal 83  04/21/21 1131 123/74 Sitting Left Arm Normal 83  04/21/21 1130 123/73 Supine Left Arm Normal 78     Vitals:   04/21/21 1110  BP: (!) 143/74  Pulse: 76  Resp: 16  Temp: 97.8 F (36.6 C)  SpO2: 96%     Body mass index is 22.08 kg/m. Filed Weights   04/21/21 1110  Weight: 172 lb (78 kg)    Orthostatic VS for the past 72 hrs (Last 3 readings):  Patient Position BP Location Cuff Size  04/21/21 1110 Sitting Left Arm Normal      Objective:   Physical Exam Vitals and nursing note reviewed.  Constitutional:      General: He is not in acute distress. Neck:      Vascular: No JVD.  Cardiovascular:     Rate and Rhythm: Normal rate and regular rhythm.     Pulses:          Femoral pulses are 2+ on the right side and 2+ on the left side.      Popliteal pulses  are 2+ on the right side and 1+ on the left side.       Dorsalis pedis pulses are 2+ on the right side and 0 on the left side.       Posterior tibial pulses are 0 on the right side and 0 on the left side.     Heart sounds: Normal heart sounds. No murmur heard. Pulmonary:     Effort: Pulmonary effort is normal.     Breath sounds: Normal breath sounds. No wheezing or rales.  Musculoskeletal:     Right lower leg: No edema.     Left lower leg: No edema.        Assessment & Recommendations:   81 y.o. Caucasian male with uncontrolled type 2 diabetes mellitus, CAD, PAD, recurrent syncope, spinal stenosis s/p prior surgeries  Recurrent syncope: No recurrence since last visit, when he was found to have orthostatic hypotension.  I do not think he needs loop recorder placement at this time.   CAD:  Obstructive CAD with high risk anatomy, stable angina Successfully treated with LAD/ramus T stenting (10/01/2020) No angina symptoms at this time. Okay to hold plavix for spinal surgery 04/24/2021. May resume plavix monotherapy without Aspirin, when deemed safe by surgeons.  Continue metoprolol succinate 25 mg daily. Continue rosuvastatin 40 mg daily. Check lipid panel.  Cardiac risk stratification: Okay to hold plavix for 5 days before spine surgery. Recommend resuming after the surgery, when deemed safe.  PAD: B/l ABI 0.73 (08/2020) No critical limb ischemia or lifestyle limiting claudication Previously had a focal eschar on left foot 2nd toe, which could have been an atheroembolic event. Likely source could be distal aortic arahc aulcer. Now completely healed. Continue medical management and regular walking Continue plavix. Currently not taking pletal, but does not have severe claudication  symptoms,.  Distal aortic arch ulcer: Repeat CT in 6 months as per CVTS  F/u in 6 months    Nigel Mormon, MD Pager: [phone] Office: [phone]

## 2021-04-22 ENCOUNTER — Other Ambulatory Visit (HOSPITAL_COMMUNITY): Payer: Self-pay

## 2021-04-22 ENCOUNTER — Encounter (HOSPITAL_COMMUNITY): Payer: Self-pay

## 2021-04-22 ENCOUNTER — Encounter (HOSPITAL_COMMUNITY)
Admission: RE | Admit: 2021-04-22 | Discharge: 2021-04-22 | Disposition: A | Discharge status: Home / Self Care | Payer: Medicare PPO | Source: Ambulatory Visit | Attending: Neurological Surgery | Admitting: Neurological Surgery

## 2021-04-22 VITALS — BP 141/76 | HR 76 | Temp 97.5°F | Resp 17 | Ht 74.0 in | Wt 171.7 lb

## 2021-04-22 DIAGNOSIS — I471 Supraventricular tachycardia: Secondary | ICD-10-CM | POA: Insufficient documentation

## 2021-04-22 DIAGNOSIS — Z7902 Long term (current) use of antithrombotics/antiplatelets: Secondary | ICD-10-CM | POA: Insufficient documentation

## 2021-04-22 DIAGNOSIS — Z955 Presence of coronary angioplasty implant and graft: Secondary | ICD-10-CM | POA: Insufficient documentation

## 2021-04-22 DIAGNOSIS — Z8701 Personal history of pneumonia (recurrent): Secondary | ICD-10-CM | POA: Insufficient documentation

## 2021-04-22 DIAGNOSIS — Z01812 Encounter for preprocedural laboratory examination: Secondary | ICD-10-CM | POA: Insufficient documentation

## 2021-04-22 DIAGNOSIS — Z01818 Encounter for other preprocedural examination: Secondary | ICD-10-CM

## 2021-04-22 DIAGNOSIS — I771 Stricture of artery: Secondary | ICD-10-CM | POA: Insufficient documentation

## 2021-04-22 DIAGNOSIS — I25118 Atherosclerotic heart disease of native coronary artery with other forms of angina pectoris: Secondary | ICD-10-CM | POA: Insufficient documentation

## 2021-04-22 DIAGNOSIS — I472 Ventricular tachycardia, unspecified: Secondary | ICD-10-CM | POA: Insufficient documentation

## 2021-04-22 DIAGNOSIS — Z20822 Contact with and (suspected) exposure to covid-19: Secondary | ICD-10-CM | POA: Insufficient documentation

## 2021-04-22 DIAGNOSIS — I719 Aortic aneurysm of unspecified site, without rupture: Secondary | ICD-10-CM | POA: Insufficient documentation

## 2021-04-22 DIAGNOSIS — I2510 Atherosclerotic heart disease of native coronary artery without angina pectoris: Secondary | ICD-10-CM

## 2021-04-22 LAB — BASIC METABOLIC PANEL
Anion gap: 8 (ref 5–15)
BUN: 15 mg/dL (ref 8–23)
CO2: 30 mmol/L (ref 22–32)
Calcium: 9.2 mg/dL (ref 8.9–10.3)
Chloride: 100 mmol/L (ref 98–111)
Creatinine, Ser: 1.02 mg/dL (ref 0.61–1.24)
GFR, Estimated: >60 mL/min (ref >60)
Glucose, Bld: 145 mg/dL — ABNORMAL HIGH (ref 70–99)
Potassium: 4.9 mmol/L (ref 3.5–5.1)
Sodium: 138 mmol/L (ref 135–145)

## 2021-04-22 LAB — CBC
HCT: 38.9 % — ABNORMAL LOW (ref 39.0–52.0)
Hemoglobin: 12.6 g/dL — ABNORMAL LOW (ref 13.0–17.0)
MCH: 30.4 pg (ref 26.0–34.0)
MCHC: 32.4 g/dL (ref 30.0–36.0)
MCV: 94 fL (ref 80.0–100.0)
Platelets: 165 10*3/uL (ref 150–400)
RBC: 4.14 MIL/uL — ABNORMAL LOW (ref 4.22–5.81)
RDW: 14.1 % (ref 11.5–15.5)
WBC: 6.6 10*3/uL (ref 4.0–10.5)
nRBC: 0 % (ref 0.0–0.2)

## 2021-04-22 LAB — TYPE AND SCREEN
ABO/RH(D): O POS
Antibody Screen: NEGATIVE

## 2021-04-22 LAB — SURGICAL PCR SCREEN
MRSA, PCR: NEGATIVE
Staphylococcus aureus: POSITIVE — AB

## 2021-04-22 LAB — GLUCOSE, CAPILLARY: Glucose-Capillary: 127 mg/dL — ABNORMAL HIGH (ref 70–99)

## 2021-04-22 LAB — SARS CORONAVIRUS 2 (TAT 6-24 HRS): SARS Coronavirus 2: NEGATIVE

## 2021-04-22 NOTE — Progress Notes (Signed)
PCP - Dr. [name] Cardiologist - Dr. Virgina Jock  PPM/ICD - n/a  Chest x-ray - 03/12/21-1 view EKG - 03/13/21 Stress Test - 08/28/20 ECHO - 08/28/20 Cardiac Cath - 09/17/20  Sleep Study - denies CPAP - denies  Fasting Blood Sugar - 125-140 Checks Blood Sugar __1___ times a day Last A1C 8.1 on 03/12/21. CBG at PAT 127  Blood Thinner Instructions: Plavix, per cards hold 5 days pre-op. LD 04/19/21. Aspirin Instructions: n/a  ERAS Protcol -Clear liquids until 0430 DOS per anesthesia protocol. PRE-SURGERY Ensure or G2- none ordered.  COVID TEST- 04/22/21, done in PAT.  Anesthesia review: Yes, cardiac history.   Patient denies shortness of breath, fever, cough and chest pain at PAT appointment   All instructions explained to the patient, with a verbal understanding of the material. Patient agrees to go over the instructions while at home for a better understanding. Patient also instructed to self quarantine after being tested for COVID-19. The opportunity to ask questions was provided.

## 2021-04-23 NOTE — Progress Notes (Signed)
Anesthesia Chart Review:  Patient initially seen in PAT clinic 08/05/2020 for preop evaluation for lumbar surgery.  At that time patient reported recent syncopal episode that resulted in ED visit for laceration.  Syncopal episode was concerning for cardiac etiology and he was advised surgery needs to be postponed to allow for cardiology evaluation.  Patient was subsequently seen by Dr. Virgina Jock who ordered event monitor, echo, stress test.  Echo showed mildly depressed LV function EF 50%, grade 1 DD; event monitor showed dominant rhythm to be sinus with 17 episodes of SVT and 1 episode of VT, no atrial fibrillation/flutter/high-grade AV block or sinus pauses greater than 3 seconds noted; exercise stress test showed reversible defect in the apical anterior myocardium and was deemed high risk.  Catheterization showed ostial to mid LAD 90% stenosis, mid 70% ramus stenosis, proximal 60% stenosis and a small, nondominant RCA.  Patient ultimately underwent successful complex stenting intervention with simultaneous T stenting to the LAD and ramus.  Patient was last seen by Dr. Virgina Jock 05/19/2021.  Discussed that he was doing well other than occasional lightheadedness with standing up too quickly.  No anginal symptoms.  He was cleared for surgery at that time.  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 No angina symptoms at this time. Okay to hold plavix for spinal surgery 04/24/2021. 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  He was referred to thoracic surgery for follow-up. He had follow-up CT on 03/18/2021 which showed no change in aortic arch penetrating ulcer. Last seen by Dr. Cyndia Bent 03/19/2021; recommended to follow-up in 6 months for continued monitoring.  Patient also had an incidentally  discovered finding of narrowing/stenosis of celiac vessel origin on CT during admission in June 2022.  Per hospitalist notes, this was discussed with vascular surgery and no intervention was recommended.  Recent admission 12/28 through 03/14/2021 for sepsis due to community-acquired pneumonia and RSV infection.  DM2 remains poorly controlled, however it is improved significantly from previous.  A1c 7.9 on 04/04/2021, which is down from 9.5 on 08/05/2020.  Preop labs reviewed, unremarkable.   EKG 03/04/2021 (at time of admission for acute respiratory illness): Sinus tachycardia.  Rate 113. Ventricular premature complex. Borderline right axis deviation. Anteroseptal infarct, old. Nonspecific T abnormalities, lateral leads  Coronary intervention 10/01/2020: Successful percutaneous coronary intervention LAD and ramus with simultaneous T-stenting        PTCA and stent placement 3.0 X 38 mm Onyx Frontier drug-eluting stent Ostial-mid LAD        PTCA and stent placement 3.0 X 26 mm Resolute Onyx drug-eluting stent        Final kissing balloon angioplasty with 3.5X15 mm Anton balloon in LAD & 2.75X15 mm Ashley balloon in ramus   LAD stenosis 90%-->0% Ramus stenosis 80%-->0% TIMI flow III-->III Coronary angiography 09/17/2020: LM: Normal LAD: Long ostial to mid LAD 90% stenosis        Small high diag 1 patent        Subtotally occluded diag 2, collaterals from OM Ramus: Medium sized vessel with mid 70% stenosis LCx: Large,dominant. Normal RCA: Small, non-dominant. Prox 60% stenosis   LVEDP normal   Obstructive CAD could possible be a cause of his syncope, although this is only speculative. He feels tired after walking for 2 miles, although denies any specific angina symptoms.  Based on  his coronary angiogram findings, perioperative cardiac risk is elevated. Will discuss with Dr. Erline Levine re: urgency and nature of his upcoming spine surgery and then discuss further recommendations re: CAD   Mobile  cardiac telemetry 11 days 08/28/2020 - 09/09/2020: Dominant rhythm: Sinus. HR 57-123 bpm. Avg HR 78 bpm, while in sinus rhythm. 17 episodes of SVT, fastest at 207 bpm for 13 beats, longest for 20 beats at 140 bpm. <1% isolated SVE, couplet/triplets. 1 episode of VT, at 162 bpm for 5 beats 2.2% isolated VE, <1% couplet No atrial fibrillation/atrial flutter/high grade AV block, sinus pause >3sec noted. 5 patient triggered events, all correlated with sinus rhythm   Exercise Tetrofosmin stress test 08/28/2020: Exercise nuclear stress test was performed using Bruce protocol. Patient reached 4.6 METS, and 93% of age predicted maximum heart rate. Exercise capacity was low. No chest pain reported. Heart rate and hemodynamic response were normal. Stress EKG showed sinus tachycardia, old anteroseptal infarct, no significant ST-T changes. SPECT images show small sized, severe intensity, reversible perfusion defect in apical anterior myocardium, with associated mild hypokinesis. Stress LVEF 47%. High risk study.   Echocardiogram 08/28/2020:    Mildly depressed LV systolic function with EF 50%. Left ventricle cavity  is normal in size. Mild concentric hypertrophy of the left ventricle.  Normal global wall motion. Doppler evidence of grade I (impaired)  diastolic dysfunction, elevated LAP. Calculated EF 50%.  Left atrial cavity is mildly dilated by volume index.  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   Wynonia Musty [facility] Short Stay Center/Anesthesiology Phone [phone] 04/23/2021 9:42 AM

## 2021-04-23 NOTE — Anesthesia Preprocedure Evaluation (Addendum)
Anesthesia Evaluation  Patient identified by MRN, date of birth, ID band Patient awake    Reviewed: Allergy & Precautions, NPO status , Patient's Chart, lab work & pertinent test results, reviewed documented beta blocker date and time   Airway Mallampati: III  TM Distance: >3 FB Neck ROM: Full    Dental  (+) Dental Advisory Given, Missing, Partial Lower, Partial Upper   Pulmonary former smoker,    Pulmonary exam normal breath sounds clear to auscultation       Cardiovascular hypertension, Pt. on home beta blockers and Pt. on medications + angina + CAD, + Cardiac Stents and + Peripheral Vascular Disease  Normal cardiovascular exam Rhythm:Regular Rate:Normal     Neuro/Psych PSYCHIATRIC DISORDERS Depression Pseudoarthrosis of lumbar spine  Neuromuscular disease    GI/Hepatic negative GI ROS, Neg liver ROS,   Endo/Other  diabetes, Type 2, Oral Hypoglycemic Agents  Renal/GU Renal disease     Musculoskeletal  (+) Arthritis ,   Abdominal   Peds  Hematology  (+) Blood dyscrasia (Plavix), anemia ,   Anesthesia Other Findings   Reproductive/Obstetrics                           Anesthesia Physical Anesthesia Plan  ASA: 3  Anesthesia Plan: General   Post-op Pain Management: Tylenol PO (pre-op)   Induction: Intravenous  PONV Risk Score and Plan: 2 and Dexamethasone and Ondansetron  Airway Management Planned: Oral ETT  Additional Equipment: Arterial line  Intra-op Plan:   Post-operative Plan: Extubation in OR  Informed Consent: I have reviewed the patients History and Physical, chart, labs and discussed the procedure including the risks, benefits and alternatives for the proposed anesthesia with the patient or authorized representative who has indicated his/her understanding and acceptance.     Dental advisory given  Plan Discussed with: CRNA  Anesthesia Plan Comments: (2nd large bore  PIV   PAT note by Karoline Caldwell, PA-C: Patient initially seen in PAT clinic 08/05/2020 for preop evaluation for lumbar surgery.  At that time patient reported recent syncopal episode that resulted in ED visit for laceration.  Syncopal episode was concerning for cardiac etiology and he was advised surgery needs to be postponed to allow for cardiology evaluation.  Patient was subsequently seen by Dr. Virgina Jock who ordered event monitor, echo, stress test.  Echo showed mildly depressed LV function EF 50%, grade 1 DD; event monitor showed dominant rhythm to be sinus with 17 episodes of SVT and 1 episode of VT, no atrial fibrillation/flutter/high-grade AV block or sinus pauses greater than 3 seconds noted; exercise stress test showed reversible defect in the apical anterior myocardium and was deemed high risk.  Catheterization showed ostial to mid LAD 90% stenosis, mid 70% ramus stenosis, proximal 60% stenosis and a small, nondominant RCA.  Patient ultimately underwent successful complex stenting intervention with simultaneous T stenting to the LAD and ramus.  Patient was last seen by Dr. Virgina Jock 05/19/2021.  Discussed that he was doing well other than occasional lightheadedness with standing up too quickly.  No anginal symptoms.  He was cleared for surgery at that time.  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 No angina symptoms at this time. Okay to hold plavix for spinal surgery 04/24/2021. 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  He  was referred to thoracic surgery for follow-up. He had follow-up CT on 03/18/2021 which showed no change in aortic arch penetrating ulcer. Last seen by Dr. Cyndia Bent 03/19/2021; recommended to follow-up in 6 months for continued monitoring.  Patient also had an  incidentally discovered finding of narrowing/stenosis of celiac vessel origin on CT during admission in June 2022.  Per hospitalist notes, this was discussed with vascular surgery and no intervention was recommended.  Recent admission 12/28 through 03/14/2021 for sepsis due to community-acquired pneumonia and RSV infection.  DM2 remains poorly controlled, however it is improved significantly from previous.  A1c 7.9 on 04/04/2021, which is down from 9.5 on 08/05/2020.  Preop labs reviewed, unremarkable.   EKG 03/04/2021 (at time of admission for acute respiratory illness): Sinus tachycardia.  Rate 113. Ventricular premature complex. Borderline right axis deviation. Anteroseptal infarct, old. Nonspecific T abnormalities, lateral leads  Coronary intervention 10/01/2020: Successful percutaneous coronary intervention LAD and ramus with simultaneous T-stenting PTCA and stent placement 3.0 X 38 mm Onyx Frontier drug-eluting stent Ostial-mid LAD PTCA and stent placement 3.0 X 26 mm Resolute Onyx drug-eluting stent Final kissing balloon angioplasty with 3.5X15 mm [place] balloon in LAD &2.75X15 mm [place]is balloon in ramus  LAD stenosis 90%-->0% Ramus stenosis 80%-->0% TIMI flow III-->III Coronary angiography 09/17/2020: LM: Normal LAD: Long ostial to mid LAD 90% stenosis Small high diag 1 patent Subtotally occluded diag 2, collaterals from OM Ramus: Medium sized vessel with mid 70% stenosis LCx: Large,dominant. Normal RCA: Small, non-dominant. Prox 60% stenosis  LVEDP normal  Obstructive CAD could possible be a cause of his syncope, although this is only speculative. He feels tired after walking for 2 miles, although denies any specific angina symptoms. Based on his coronary angiogram findings, perioperative cardiac risk is elevated. Will discuss with Dr. Erline Levine re: urgency and nature of his upcoming spine surgery and then discuss further recommendations re:  CAD  Mobile cardiac telemetry 11 days 08/28/2020 - 09/09/2020: Dominant rhythm: Sinus. HR 57-123 bpm. Avg HR 78 bpm, while in sinus rhythm. 17 episodes of SVT, fastest at 207 bpm for 13 beats, longest for 20 beats at 140 bpm. <1% isolated SVE, couplet/triplets. 1 episode of VT, at 162 bpm for 5 beats 2.2% isolated VE, <1% couplet No atrial fibrillation/atrial flutter/high grade AV block, sinus pause >3sec noted. 5 patient triggered events, all correlated with sinus rhythm  Exercise Tetrofosmin stress test 08/28/2020: Exercise nuclear stress test was performed using Bruce protocol. Patient reached 4.6 METS, and 93% of age predicted maximum heart rate. Exercise capacity was low. No chest pain reported. Heart rate and hemodynamic response were normal. Stress EKG showed sinus tachycardia, old anteroseptal infarct, no significant ST-T changes. SPECT images show small sized, severe intensity, reversible perfusion defect in apical anterior myocardium, with associated mild hypokinesis. Stress LVEF 47%. High risk study.  Echocardiogram 08/28/2020:  Mildly depressed LV systolic function with EF 50%. Left ventricle cavity  is normal in size. Mild concentric hypertrophy of the left ventricle.  Normal global wall motion. Doppler evidence of grade I (impaired)  diastolic dysfunction, elevated LAP. Calculated EF 50%.  Left atrial cavity is mildly dilated by volume index.  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 )      Anesthesia Quick Evaluation

## 2021-04-24 ENCOUNTER — Inpatient Hospital Stay (HOSPITAL_COMMUNITY): Payer: Medicare PPO | Admitting: Physician Assistant

## 2021-04-24 ENCOUNTER — Inpatient Hospital Stay (HOSPITAL_COMMUNITY)
Admission: RE | Admit: 2021-04-24 | Discharge: 2021-04-26 | DRG: 454 | Disposition: A | Discharge status: Home / Self Care | Payer: Medicare PPO | Attending: Neurological Surgery | Admitting: Neurological Surgery

## 2021-04-24 ENCOUNTER — Inpatient Hospital Stay (HOSPITAL_COMMUNITY)
Admission: RE | Disposition: A | Discharge status: Home / Self Care | Payer: Self-pay | Source: Home / Self Care | Attending: Neurological Surgery

## 2021-04-24 ENCOUNTER — Inpatient Hospital Stay (HOSPITAL_COMMUNITY): Payer: Medicare PPO

## 2021-04-24 ENCOUNTER — Encounter (HOSPITAL_COMMUNITY): Payer: Self-pay | Admitting: Neurological Surgery

## 2021-04-24 ENCOUNTER — Other Ambulatory Visit: Payer: Self-pay

## 2021-04-24 DIAGNOSIS — Z87891 Personal history of nicotine dependence: Secondary | ICD-10-CM

## 2021-04-24 DIAGNOSIS — I10 Essential (primary) hypertension: Secondary | ICD-10-CM

## 2021-04-24 DIAGNOSIS — E781 Pure hyperglyceridemia: Secondary | ICD-10-CM | POA: Diagnosis present

## 2021-04-24 DIAGNOSIS — F32A Depression, unspecified: Secondary | ICD-10-CM | POA: Diagnosis present

## 2021-04-24 DIAGNOSIS — G2581 Restless legs syndrome: Secondary | ICD-10-CM | POA: Diagnosis present

## 2021-04-24 DIAGNOSIS — T84216A Breakdown (mechanical) of internal fixation device of vertebrae, initial encounter: Secondary | ICD-10-CM | POA: Diagnosis present

## 2021-04-24 DIAGNOSIS — M1990 Unspecified osteoarthritis, unspecified site: Secondary | ICD-10-CM | POA: Diagnosis present

## 2021-04-24 DIAGNOSIS — M5416 Radiculopathy, lumbar region: Secondary | ICD-10-CM | POA: Diagnosis present

## 2021-04-24 DIAGNOSIS — Z9049 Acquired absence of other specified parts of digestive tract: Secondary | ICD-10-CM | POA: Diagnosis not present

## 2021-04-24 DIAGNOSIS — M960 Pseudarthrosis after fusion or arthrodesis: Secondary | ICD-10-CM | POA: Diagnosis present

## 2021-04-24 DIAGNOSIS — Z79899 Other long term (current) drug therapy: Secondary | ICD-10-CM

## 2021-04-24 DIAGNOSIS — Z8261 Family history of arthritis: Secondary | ICD-10-CM

## 2021-04-24 DIAGNOSIS — I2510 Atherosclerotic heart disease of native coronary artery without angina pectoris: Secondary | ICD-10-CM | POA: Diagnosis present

## 2021-04-24 DIAGNOSIS — Z20822 Contact with and (suspected) exposure to covid-19: Secondary | ICD-10-CM | POA: Diagnosis present

## 2021-04-24 DIAGNOSIS — Z833 Family history of diabetes mellitus: Secondary | ICD-10-CM | POA: Diagnosis not present

## 2021-04-24 DIAGNOSIS — G4700 Insomnia, unspecified: Secondary | ICD-10-CM | POA: Diagnosis present

## 2021-04-24 DIAGNOSIS — M5126 Other intervertebral disc displacement, lumbar region: Secondary | ICD-10-CM | POA: Diagnosis present

## 2021-04-24 DIAGNOSIS — Z7984 Long term (current) use of oral hypoglycemic drugs: Secondary | ICD-10-CM

## 2021-04-24 DIAGNOSIS — M5136 Other intervertebral disc degeneration, lumbar region: Secondary | ICD-10-CM | POA: Diagnosis present

## 2021-04-24 DIAGNOSIS — Z981 Arthrodesis status: Secondary | ICD-10-CM

## 2021-04-24 DIAGNOSIS — Y792 Prosthetic and other implants, materials and accessory orthopedic devices associated with adverse incidents: Secondary | ICD-10-CM | POA: Diagnosis present

## 2021-04-24 DIAGNOSIS — Z955 Presence of coronary angioplasty implant and graft: Secondary | ICD-10-CM | POA: Diagnosis not present

## 2021-04-24 DIAGNOSIS — I739 Peripheral vascular disease, unspecified: Secondary | ICD-10-CM

## 2021-04-24 DIAGNOSIS — M48061 Spinal stenosis, lumbar region without neurogenic claudication: Secondary | ICD-10-CM | POA: Diagnosis present

## 2021-04-24 DIAGNOSIS — Z7902 Long term (current) use of antithrombotics/antiplatelets: Secondary | ICD-10-CM

## 2021-04-24 DIAGNOSIS — E1142 Type 2 diabetes mellitus with diabetic polyneuropathy: Secondary | ICD-10-CM | POA: Diagnosis present

## 2021-04-24 DIAGNOSIS — Z888 Allergy status to other drugs, medicaments and biological substances status: Secondary | ICD-10-CM | POA: Diagnosis not present

## 2021-04-24 DIAGNOSIS — M48062 Spinal stenosis, lumbar region with neurogenic claudication: Principal | ICD-10-CM | POA: Diagnosis present

## 2021-04-24 DIAGNOSIS — Z881 Allergy status to other antibiotic agents status: Secondary | ICD-10-CM | POA: Diagnosis not present

## 2021-04-24 DIAGNOSIS — E1151 Type 2 diabetes mellitus with diabetic peripheral angiopathy without gangrene: Secondary | ICD-10-CM | POA: Diagnosis not present

## 2021-04-24 DIAGNOSIS — I25119 Atherosclerotic heart disease of native coronary artery with unspecified angina pectoris: Secondary | ICD-10-CM

## 2021-04-24 DIAGNOSIS — E785 Hyperlipidemia, unspecified: Secondary | ICD-10-CM | POA: Diagnosis present

## 2021-04-24 DIAGNOSIS — M47816 Spondylosis without myelopathy or radiculopathy, lumbar region: Secondary | ICD-10-CM | POA: Diagnosis present

## 2021-04-24 DIAGNOSIS — Z8546 Personal history of malignant neoplasm of prostate: Secondary | ICD-10-CM | POA: Diagnosis not present

## 2021-04-24 DIAGNOSIS — Z8249 Family history of ischemic heart disease and other diseases of the circulatory system: Secondary | ICD-10-CM

## 2021-04-24 DIAGNOSIS — Z87442 Personal history of urinary calculi: Secondary | ICD-10-CM

## 2021-04-24 DIAGNOSIS — Z419 Encounter for procedure for purposes other than remedying health state, unspecified: Secondary | ICD-10-CM

## 2021-04-24 HISTORY — PX: APPLICATION OF INTRAOPERATIVE CT SCAN: SHX6668

## 2021-04-24 LAB — GLUCOSE, CAPILLARY
Glucose-Capillary: 185 mg/dL — ABNORMAL HIGH (ref 70–99)
Glucose-Capillary: 130 mg/dL — ABNORMAL HIGH (ref 70–99)
Glucose-Capillary: 150 mg/dL — ABNORMAL HIGH (ref 70–99)
Glucose-Capillary: 208 mg/dL — ABNORMAL HIGH (ref 70–99)

## 2021-04-24 LAB — POCT I-STAT 7, (LYTES, BLD GAS, ICA,H+H)
Acid-Base Excess: 3 mmol/L — ABNORMAL HIGH (ref 0.0–2.0)
Bicarbonate: 26.8 mmol/L (ref 20.0–28.0)
Calcium, Ion: 1.16 mmol/L (ref 1.15–1.40)
HCT: 35 % — ABNORMAL LOW (ref 39.0–52.0)
Hemoglobin: 11.9 g/dL — ABNORMAL LOW (ref 13.0–17.0)
O2 Saturation: 100 %
Patient temperature: 36.9
Potassium: 4.5 mmol/L (ref 3.5–5.1)
Sodium: 137 mmol/L (ref 135–145)
TCO2: 28 mmol/L (ref 22–32)
pCO2 arterial: 38.1 mmHg (ref 32.0–48.0)
pH, Arterial: 7.456 — ABNORMAL HIGH (ref 7.350–7.450)
pO2, Arterial: 281 mmHg — ABNORMAL HIGH (ref 83.0–108.0)

## 2021-04-24 IMAGING — RF DG LUMBAR SPINE 2-3V
1 series · 3 of 3 positions shown · non-contrast
Comparison: Lumbar spine radiographs [DATE]

CLINICAL DATA: L3 through S1 fusion.  Fluoroscopy.

EXAM:
LUMBAR SPINE - 2-3 VIEW

[Series 1: run · 3 of 3 slices shown]
[im 1/3]
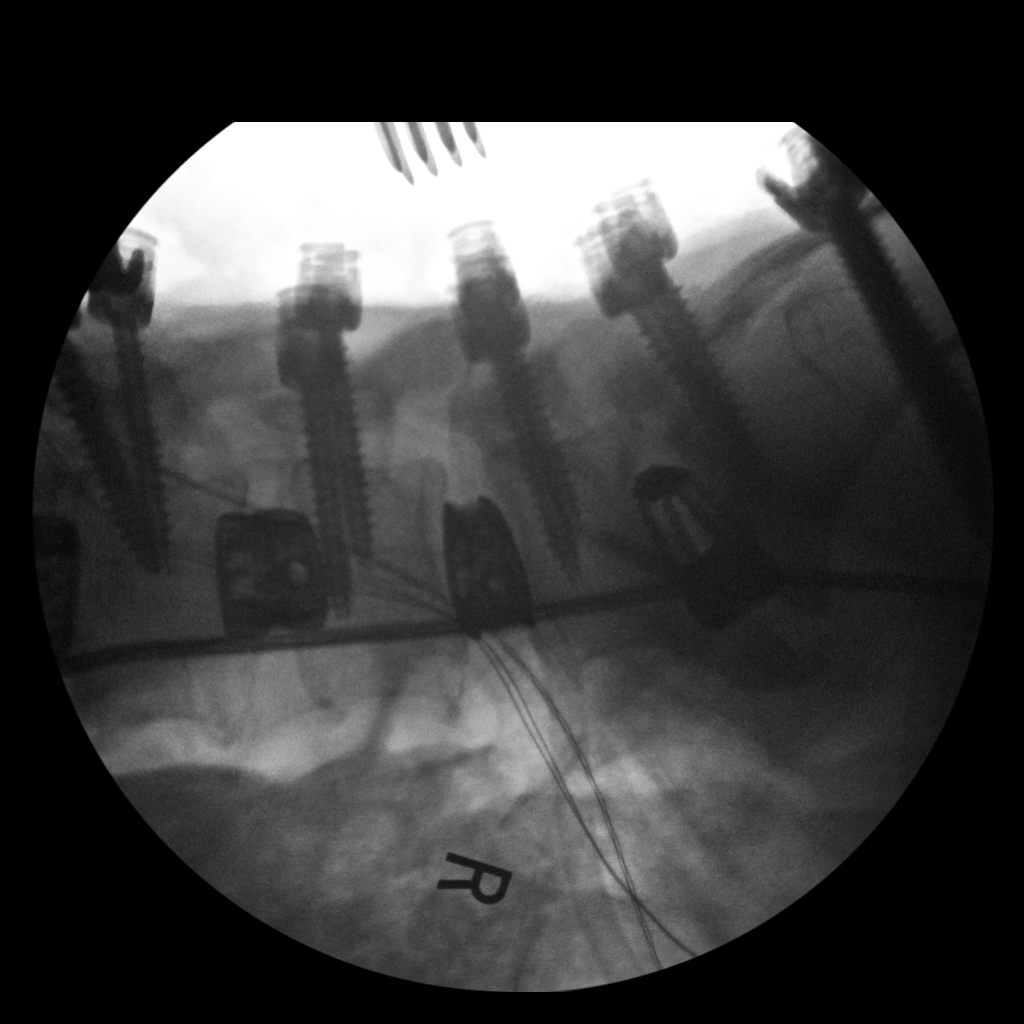
[im 2/3]
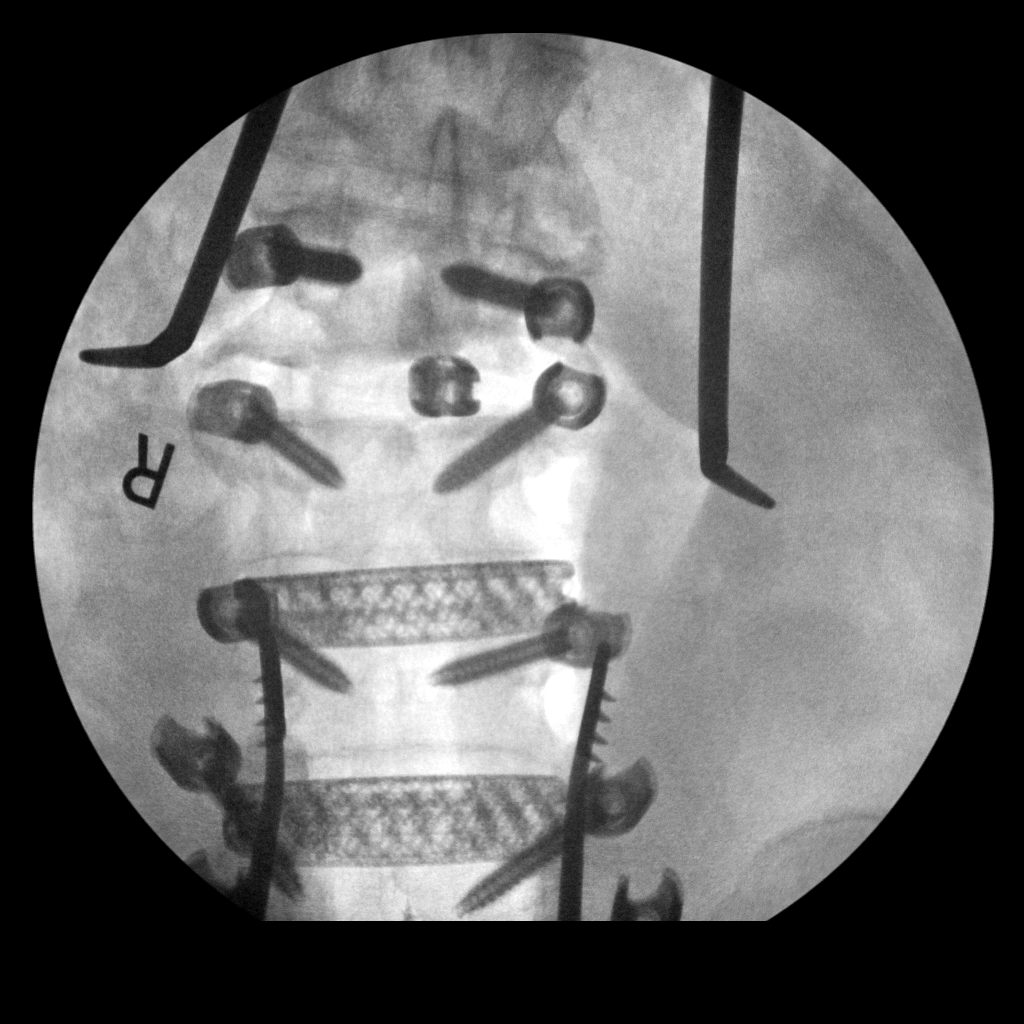
[im 3/3]
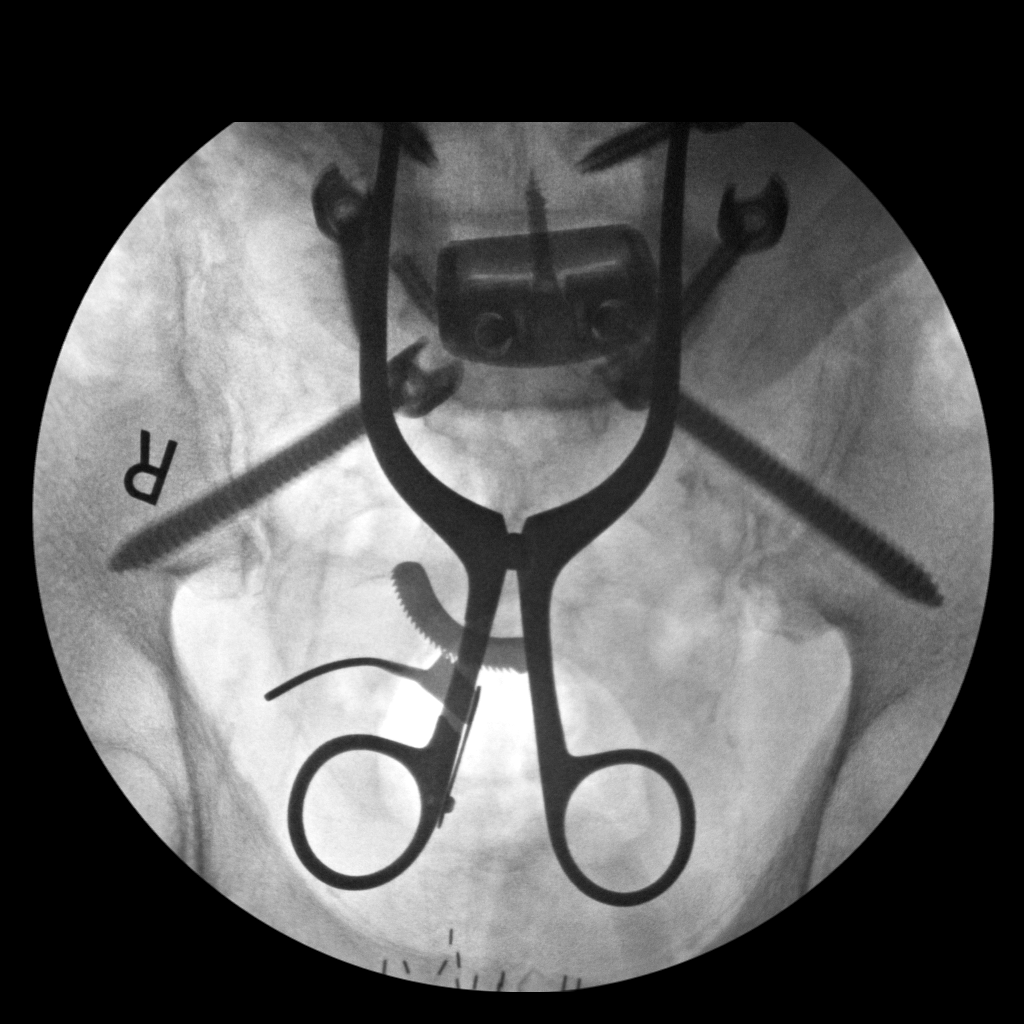

[3 of 3 positions shown; findings below may reference images not displayed]

FINDINGS: Images were performed intraoperatively without the presence of a
radiologist.

Fluoroscopic time: 31 seconds

Dose: 15.2 mGy

Surgical instrumentation points to the posterior aspect of the
patient's L5 transpedicular screws. Note is made the patient has L3
through S1 posterior transpedicular screws and associated
intervertebral disc spacers, as on prior radiographs. On the third
image the patient appears to be undergoing placement of bilateral
sacroiliac fusion screws. The patient also appears to be undergoing
placement of new L2 transpedicular screws.

Please see intraoperative findings for further detail.
IMPRESSION: L2 through S1 and bilateral trans sacroiliac joint fusion visualized
on fluoroscopy.

## 2021-04-24 SURGERY — POSTERIOR LUMBAR FUSION 1 LEVEL
Anesthesia: General | Site: Spine Lumbar

## 2021-04-24 MED ORDER — PROPOFOL 10 MG/ML IV BOLUS
INTRAVENOUS | Status: DC | PRN
  Administered 2021-04-24: 140 mg via INTRAVENOUS

## 2021-04-24 MED ORDER — DEXAMETHASONE SODIUM PHOSPHATE 10 MG/ML IJ SOLN
INTRAMUSCULAR | Status: AC
  Filled 2021-04-24: qty 1

## 2021-04-24 MED ORDER — LIDOCAINE 2% (20 MG/ML) 5 ML SYRINGE
INTRAMUSCULAR | Status: DC | PRN
  Administered 2021-04-24: 80 mg via INTRAVENOUS

## 2021-04-24 MED ORDER — ROCURONIUM BROMIDE 10 MG/ML (PF) SYRINGE
PREFILLED_SYRINGE | INTRAVENOUS | Status: DC | PRN
  Administered 2021-04-24: 20 mg via INTRAVENOUS
  Administered 2021-04-24: 60 mg via INTRAVENOUS
  Administered 2021-04-24 (×2): 20 mg via INTRAVENOUS
  Administered 2021-04-24: 30 mg via INTRAVENOUS

## 2021-04-24 MED ORDER — ONDANSETRON HCL 4 MG/2ML IJ SOLN: 4.0000 mg | Freq: Once | INTRAMUSCULAR | Status: DC | PRN

## 2021-04-24 MED ORDER — PHENOL 1.4 % MT LIQD: 1.0000 | OROMUCOSAL | Status: DC | PRN

## 2021-04-24 MED ORDER — ALUM & MAG HYDROXIDE-SIMETH 200-200-20 MG/5ML PO SUSP: 30.0000 mL | Freq: Four times a day (QID) | ORAL | Status: DC | PRN

## 2021-04-24 MED ORDER — ONDANSETRON HCL 4 MG/2ML IJ SOLN
INTRAMUSCULAR | Status: AC
  Filled 2021-04-24: qty 2

## 2021-04-24 MED ORDER — CEFAZOLIN SODIUM 1 G IJ SOLR
INTRAMUSCULAR | Status: AC
  Filled 2021-04-24: qty 20

## 2021-04-24 MED ORDER — CHLORHEXIDINE GLUCONATE 0.12 % MT SOLN
15.0000 mL | Freq: Once | OROMUCOSAL | Status: AC
  Administered 2021-04-24: 15 mL via OROMUCOSAL

## 2021-04-24 MED ORDER — VANCOMYCIN HCL 1000 MG IV SOLR
INTRAVENOUS | Status: DC | PRN
  Administered 2021-04-24: 1000 mg via TOPICAL

## 2021-04-24 MED ORDER — INSULIN ASPART 100 UNIT/ML IJ SOLN
INTRAMUSCULAR | Status: AC
  Filled 2021-04-24: qty 1

## 2021-04-24 MED ORDER — THROMBIN 5000 UNITS EX SOLR: OROMUCOSAL | Status: DC | PRN

## 2021-04-24 MED ORDER — SODIUM CHLORIDE 0.9 % IV SOLN: 250.0000 mL | INTRAVENOUS | Status: DC

## 2021-04-24 MED ORDER — DEXAMETHASONE SODIUM PHOSPHATE 10 MG/ML IJ SOLN
INTRAMUSCULAR | Status: DC | PRN
Start: 2021-04-24 — End: 2021-04-24
  Administered 2021-04-24: 4 mg via INTRAVENOUS

## 2021-04-24 MED ORDER — ROSUVASTATIN CALCIUM 20 MG PO TABS
40.0000 mg | ORAL_TABLET | Freq: Every day | ORAL | Status: DC
  Administered 2021-04-25 – 2021-04-26 (×2): 40 mg via ORAL
  Filled 2021-04-24 (×2): qty 2

## 2021-04-24 MED ORDER — SUGAMMADEX SODIUM 200 MG/2ML IV SOLN
INTRAVENOUS | Status: DC | PRN
  Administered 2021-04-24: 200 mg via INTRAVENOUS

## 2021-04-24 MED ORDER — THROMBIN 20000 UNITS EX SOLR
CUTANEOUS | Status: AC
  Filled 2021-04-24: qty 20000

## 2021-04-24 MED ORDER — FENTANYL CITRATE (PF) 100 MCG/2ML IJ SOLN
INTRAMUSCULAR | Status: DC | PRN
  Administered 2021-04-24 (×5): 50 ug via INTRAVENOUS
  Administered 2021-04-24: 100 ug via INTRAVENOUS

## 2021-04-24 MED ORDER — ACETAMINOPHEN 500 MG PO TABS
1000.0000 mg | ORAL_TABLET | Freq: Once | ORAL | Status: AC
  Administered 2021-04-24: 1000 mg via ORAL

## 2021-04-24 MED ORDER — HYDROCODONE-ACETAMINOPHEN 5-325 MG PO TABS: 1.0000 | ORAL_TABLET | ORAL | Status: DC | PRN

## 2021-04-24 MED ORDER — SODIUM CHLORIDE 0.9 % IV SOLN: INTRAVENOUS | Status: DC

## 2021-04-24 MED ORDER — SODIUM CHLORIDE 0.9% FLUSH: 3.0000 mL | INTRAVENOUS | Status: DC | PRN

## 2021-04-24 MED ORDER — BUPIVACAINE LIPOSOME 1.3 % IJ SUSP
INTRAMUSCULAR | Status: DC | PRN
  Administered 2021-04-24: 20 mL

## 2021-04-24 MED ORDER — ONDANSETRON HCL 4 MG/2ML IJ SOLN
INTRAMUSCULAR | Status: DC | PRN
  Administered 2021-04-24: 4 mg via INTRAVENOUS

## 2021-04-24 MED ORDER — BUPIVACAINE-EPINEPHRINE 0.5% -1:200000 IJ SOLN
INTRAMUSCULAR | Status: AC
  Filled 2021-04-24: qty 1

## 2021-04-24 MED ORDER — PHENYLEPHRINE HCL-NACL 20-0.9 MG/250ML-% IV SOLN
INTRAVENOUS | Status: DC | PRN
  Administered 2021-04-24: 25 ug/min via INTRAVENOUS

## 2021-04-24 MED ORDER — THROMBIN 5000 UNITS EX SOLR
CUTANEOUS | Status: AC
  Filled 2021-04-24: qty 5000

## 2021-04-24 MED ORDER — BUPROPION HCL ER (SR) 150 MG PO TB12
150.0000 mg | ORAL_TABLET | Freq: Every day | ORAL | Status: DC
  Administered 2021-04-25 – 2021-04-26 (×2): 150 mg via ORAL
  Filled 2021-04-24 (×2): qty 1

## 2021-04-24 MED ORDER — LIDOCAINE 2% (20 MG/ML) 5 ML SYRINGE
INTRAMUSCULAR | Status: AC
  Filled 2021-04-24: qty 5

## 2021-04-24 MED ORDER — DAPAGLIFLOZIN PROPANEDIOL 10 MG PO TABS
10.0000 mg | ORAL_TABLET | Freq: Every day | ORAL | Status: DC
  Administered 2021-04-25 – 2021-04-26 (×2): 10 mg via ORAL
  Filled 2021-04-24 (×2): qty 1

## 2021-04-24 MED ORDER — LIDOCAINE-EPINEPHRINE 1 %-1:100000 IJ SOLN
INTRAMUSCULAR | Status: AC
  Filled 2021-04-24: qty 1

## 2021-04-24 MED ORDER — BUPIVACAINE-EPINEPHRINE 0.5% -1:200000 IJ SOLN
INTRAMUSCULAR | Status: DC | PRN
  Administered 2021-04-24: 8.5 mL

## 2021-04-24 MED ORDER — DOCUSATE SODIUM 100 MG PO CAPS
100.0000 mg | ORAL_CAPSULE | Freq: Two times a day (BID) | ORAL | Status: DC
  Administered 2021-04-24 – 2021-04-26 (×4): 100 mg via ORAL
  Filled 2021-04-24 (×4): qty 1

## 2021-04-24 MED ORDER — FENTANYL CITRATE (PF) 100 MCG/2ML IJ SOLN: 25.0000 ug | INTRAMUSCULAR | Status: DC | PRN

## 2021-04-24 MED ORDER — PHENYLEPHRINE 40 MCG/ML (10ML) SYRINGE FOR IV PUSH (FOR BLOOD PRESSURE SUPPORT)
PREFILLED_SYRINGE | INTRAVENOUS | Status: DC | PRN
  Administered 2021-04-24: 80 ug via INTRAVENOUS
  Administered 2021-04-24 (×2): 40 ug via INTRAVENOUS

## 2021-04-24 MED ORDER — LACTATED RINGERS IV SOLN: INTRAVENOUS | Status: DC | PRN

## 2021-04-24 MED ORDER — CEFAZOLIN SODIUM-DEXTROSE 2-4 GM/100ML-% IV SOLN
2.0000 g | Freq: Three times a day (TID) | INTRAVENOUS | Status: AC
  Administered 2021-04-24 – 2021-04-25 (×2): 2 g via INTRAVENOUS
  Filled 2021-04-24 (×2): qty 100

## 2021-04-24 MED ORDER — ROPINIROLE HCL 1 MG PO TABS
4.0000 mg | ORAL_TABLET | Freq: Every day | ORAL | Status: DC
  Administered 2021-04-24 – 2021-04-25 (×2): 4 mg via ORAL
  Filled 2021-04-24 (×2): qty 4

## 2021-04-24 MED ORDER — BUPIVACAINE LIPOSOME 1.3 % IJ SUSP
INTRAMUSCULAR | Status: AC
  Filled 2021-04-24: qty 20

## 2021-04-24 MED ORDER — FENTANYL CITRATE (PF) 250 MCG/5ML IJ SOLN
INTRAMUSCULAR | Status: AC
  Filled 2021-04-24: qty 5

## 2021-04-24 MED ORDER — MENTHOL 3 MG MT LOZG: 1.0000 | LOZENGE | OROMUCOSAL | Status: DC | PRN

## 2021-04-24 MED ORDER — VANCOMYCIN HCL 1000 MG IV SOLR
INTRAVENOUS | Status: AC
  Filled 2021-04-24: qty 20

## 2021-04-24 MED ORDER — INSULIN ASPART 100 UNIT/ML IJ SOLN
2.0000 [IU] | Freq: Once | INTRAMUSCULAR | Status: AC
  Administered 2021-04-24: 2 [IU] via SUBCUTANEOUS

## 2021-04-24 MED ORDER — METHOCARBAMOL 1000 MG/10ML IJ SOLN
500.0000 mg | Freq: Four times a day (QID) | INTRAVENOUS | Status: DC | PRN
  Filled 2021-04-24: qty 5

## 2021-04-24 MED ORDER — ONDANSETRON HCL 4 MG PO TABS: 4.0000 mg | ORAL_TABLET | Freq: Four times a day (QID) | ORAL | Status: DC | PRN

## 2021-04-24 MED ORDER — ORAL CARE MOUTH RINSE: 15.0000 mL | Freq: Once | OROMUCOSAL | Status: AC

## 2021-04-24 MED ORDER — ROCURONIUM BROMIDE 10 MG/ML (PF) SYRINGE
PREFILLED_SYRINGE | INTRAVENOUS | Status: AC
  Filled 2021-04-24: qty 10

## 2021-04-24 MED ORDER — ACETAMINOPHEN 325 MG PO TABS: 650.0000 mg | ORAL_TABLET | ORAL | Status: DC | PRN

## 2021-04-24 MED ORDER — LIDOCAINE-EPINEPHRINE 1 %-1:100000 IJ SOLN
INTRAMUSCULAR | Status: DC | PRN
  Administered 2021-04-24: 8.5 mL

## 2021-04-24 MED ORDER — 0.9 % SODIUM CHLORIDE (POUR BTL) OPTIME
TOPICAL | Status: DC | PRN
Start: 2021-04-24 — End: 2021-04-24
  Administered 2021-04-24: 1000 mL

## 2021-04-24 MED ORDER — METHOCARBAMOL 500 MG PO TABS
500.0000 mg | ORAL_TABLET | Freq: Four times a day (QID) | ORAL | Status: DC | PRN
Start: 2021-04-24 — End: 2021-04-26

## 2021-04-24 MED ORDER — ONDANSETRON HCL 4 MG/2ML IJ SOLN: 4.0000 mg | Freq: Four times a day (QID) | INTRAMUSCULAR | Status: DC | PRN

## 2021-04-24 MED ORDER — HYDROMORPHONE HCL 1 MG/ML IJ SOLN: 0.5000 mg | INTRAMUSCULAR | Status: DC | PRN

## 2021-04-24 MED ORDER — DULAGLUTIDE 3 MG/0.5ML ~~LOC~~ SOAJ: 3.0000 mg | SUBCUTANEOUS | Status: DC

## 2021-04-24 MED ORDER — GABAPENTIN 600 MG PO TABS
600.0000 mg | ORAL_TABLET | Freq: Two times a day (BID) | ORAL | Status: DC
  Administered 2021-04-24 – 2021-04-26 (×4): 600 mg via ORAL
  Filled 2021-04-24 (×4): qty 1

## 2021-04-24 MED ORDER — CEFAZOLIN SODIUM-DEXTROSE 2-4 GM/100ML-% IV SOLN
2.0000 g | INTRAVENOUS | Status: AC
Start: 2021-04-24 — End: 2021-04-24
  Administered 2021-04-24 (×2): 2 g via INTRAVENOUS

## 2021-04-24 MED ORDER — ACETAMINOPHEN 650 MG RE SUPP: 650.0000 mg | RECTAL | Status: DC | PRN

## 2021-04-24 MED ORDER — INSULIN ASPART 100 UNIT/ML IJ SOLN
0.0000 [IU] | INTRAMUSCULAR | Status: DC | PRN
  Administered 2021-04-24: 2 [IU] via SUBCUTANEOUS

## 2021-04-24 MED ORDER — METOPROLOL SUCCINATE ER 25 MG PO TB24
25.0000 mg | ORAL_TABLET | Freq: Every day | ORAL | Status: DC
Start: 2021-04-24 — End: 2021-04-26
  Administered 2021-04-25 – 2021-04-26 (×2): 25 mg via ORAL
  Filled 2021-04-24 (×2): qty 1

## 2021-04-24 MED ORDER — OXYCODONE HCL 5 MG PO TABS
10.0000 mg | ORAL_TABLET | ORAL | Status: DC | PRN
  Administered 2021-04-24: 10 mg via ORAL
  Filled 2021-04-24: qty 2

## 2021-04-24 MED ORDER — SODIUM CHLORIDE 0.9% FLUSH
3.0000 mL | Freq: Two times a day (BID) | INTRAVENOUS | Status: DC
  Administered 2021-04-24 – 2021-04-26 (×4): 3 mL via INTRAVENOUS

## 2021-04-24 MED ORDER — METFORMIN HCL 500 MG PO TABS
1000.0000 mg | ORAL_TABLET | Freq: Two times a day (BID) | ORAL | Status: DC
  Administered 2021-04-25 – 2021-04-26 (×4): 1000 mg via ORAL
  Filled 2021-04-24 (×4): qty 2

## 2021-04-24 MED ORDER — LACTATED RINGERS IV SOLN: INTRAVENOUS | Status: DC

## 2021-04-24 MED ORDER — PHENYLEPHRINE 40 MCG/ML (10ML) SYRINGE FOR IV PUSH (FOR BLOOD PRESSURE SUPPORT)
PREFILLED_SYRINGE | INTRAVENOUS | Status: AC
  Filled 2021-04-24: qty 10

## 2021-04-24 SURGICAL SUPPLY — 64 items
BAG COUNTER SPONGE SURGICOUNT (BAG) ×5 IMPLANT
BAG SPNG CNTER NS LX DISP (BAG) ×4
BUR CARBIDE MATCH 3.0 (BURR) ×4 IMPLANT
CAGE TLIF MOD 10X30X8 4D (Cage) ×1 IMPLANT
CNTNR URN SCR LID CUP LEK RST (MISCELLANEOUS) ×3 IMPLANT
CONT SPEC 4OZ STRL OR WHT (MISCELLANEOUS) ×3
COVER BACK TABLE 60X90IN (DRAPES) ×15 IMPLANT
DIGITIZER BENDINI (MISCELLANEOUS) ×1 IMPLANT
DRAPE C-ARM 42X72 X-RAY (DRAPES) ×3 IMPLANT
DRAPE LAPAROTOMY 100X72X124 (DRAPES) ×4 IMPLANT
DRAPE MICROSCOPE LEICA (MISCELLANEOUS) IMPLANT
DRAPE SCAN PATIENT (DRAPES) ×4 IMPLANT
DRSG OPSITE 4X5.5 SM (GAUZE/BANDAGES/DRESSINGS) ×1 IMPLANT
DRSG OPSITE POSTOP 4X10 (GAUZE/BANDAGES/DRESSINGS) ×1 IMPLANT
DURAPREP 26ML APPLICATOR (WOUND CARE) ×4 IMPLANT
ELECT BLADE INSULATED 4IN (ELECTROSURGICAL) ×3
ELECT REM PT RETURN 9FT ADLT (ELECTROSURGICAL) ×3
ELECTRODE BLADE INSULATED 4IN (ELECTROSURGICAL) ×3 IMPLANT
ELECTRODE REM PT RTRN 9FT ADLT (ELECTROSURGICAL) ×3 IMPLANT
GAUZE 4X4 16PLY ~~LOC~~+RFID DBL (SPONGE) ×1 IMPLANT
GAUZE SPONGE 4X4 12PLY STRL (GAUZE/BANDAGES/DRESSINGS) ×1 IMPLANT
GLOVE SRG 8 PF TXTR STRL LF DI (GLOVE) ×6 IMPLANT
GLOVE SURG LTX SZ7.5 (GLOVE) ×3 IMPLANT
GLOVE SURG LTX SZ8 (GLOVE) ×9 IMPLANT
GLOVE SURG UNDER POLY LF SZ6 (GLOVE) ×1 IMPLANT
GLOVE SURG UNDER POLY LF SZ7 (GLOVE) ×4 IMPLANT
GLOVE SURG UNDER POLY LF SZ7.5 (GLOVE) ×2 IMPLANT
GLOVE SURG UNDER POLY LF SZ8 (GLOVE) ×6
GOWN STRL REUS W/ TWL LRG LVL3 (GOWN DISPOSABLE) IMPLANT
GOWN STRL REUS W/ TWL XL LVL3 (GOWN DISPOSABLE) ×3 IMPLANT
GOWN STRL REUS W/TWL 2XL LVL3 (GOWN DISPOSABLE) IMPLANT
GOWN STRL REUS W/TWL LRG LVL3 (GOWN DISPOSABLE) ×12
GOWN STRL REUS W/TWL XL LVL3 (GOWN DISPOSABLE) ×12
HEMOSTAT POWDER KIT SURGIFOAM (HEMOSTASIS) ×2 IMPLANT
KIT BASIN OR (CUSTOM PROCEDURE TRAY) ×4 IMPLANT
KIT INFUSE X SMALL 1.4CC (Orthopedic Implant) ×1 IMPLANT
KIT POSITION SURG JACKSON T1 (MISCELLANEOUS) ×4 IMPLANT
KIT TURNOVER KIT B (KITS) ×4 IMPLANT
MARKER SKIN DUAL TIP RULER LAB (MISCELLANEOUS) ×4 IMPLANT
MARKER SPHERE PSV REFLC 13MM (MARKER) ×23 IMPLANT
MILL MEDIUM DISP (BLADE) ×1 IMPLANT
NDL HYPO 25X1 1.5 SAFETY (NEEDLE) ×3 IMPLANT
NEEDLE HYPO 25X1 1.5 SAFETY (NEEDLE) ×3 IMPLANT
NS IRRIG 1000ML POUR BTL (IV SOLUTION) ×4 IMPLANT
PACK LAMINECTOMY NEURO (CUSTOM PROCEDURE TRAY) ×4 IMPLANT
PUTTY DBM PROPEL LRG (Putty) ×1 IMPLANT
ROD RELINE 5.5X500MM STRAIGHT (Rod) ×1 IMPLANT
ROD RELINE-O 5.5X300 STRT NS (Rod) IMPLANT
ROD RELINE-O 5.5X300MM STRT (Rod) ×3 IMPLANT
SCREW LOCK RELINE 5.5 TULIP (Screw) ×14 IMPLANT
SCREW RELINE-O 8.5X90MM POLY (Screw) ×2 IMPLANT
SCREW RELINE-O POLY 7.5X50 (Screw) ×12 IMPLANT
SCREW RLINE PLY 2S 50X7.5XPA (Screw) IMPLANT
SPONGE SURGIFOAM ABS GEL 100 (HEMOSTASIS) ×3 IMPLANT
SPONGE T-LAP 4X18 ~~LOC~~+RFID (SPONGE) ×1 IMPLANT
STAPLER VISISTAT 35W (STAPLE) ×4 IMPLANT
SUT VIC AB 0 CT1 18XCR BRD8 (SUTURE) ×3 IMPLANT
SUT VIC AB 0 CT1 8-18 (SUTURE) ×3
SUT VIC AB 2-0 CP2 18 (SUTURE) ×4 IMPLANT
SUT VIC AB 3-0 SH 8-18 (SUTURE) ×4 IMPLANT
TOWEL GREEN STERILE (TOWEL DISPOSABLE) IMPLANT
TOWEL GREEN STERILE FF (TOWEL DISPOSABLE) IMPLANT
TRAY FOLEY MTR SLVR 16FR STAT (SET/KITS/TRAYS/PACK) ×4 IMPLANT
WATER STERILE IRR 1000ML POUR (IV SOLUTION) ×4 IMPLANT

## 2021-04-24 NOTE — Progress Notes (Signed)
CBG in short stay 185 @ 06:01 o'clock. Per Periop Glycemic Protocol, patient received @ 06:57 o'clock, 2 units Novolog [place]. Will continue to monitor.

## 2021-04-24 NOTE — Op Note (Signed)
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  POSTOP DIAGNOSIS: Same  PROCEDURE: Open L2-3 decompression, transforaminal interbody (left-sided approach) and instrumentation and posterolateral fusion Nonsegmental pedicle screw instrumentation, L2, bilateral, nuvasive 7.5x23mm Placement of anterior biomechanical device, NuVasive 8 x 10 x 30 titanium interbody device, L2-3 Exploration of fusion L3-4, L4-5, L5-S1 Revision and placement of Nonsegmental pedicle screws, right S1, placement of 7.5 x 50 mm NuVasive pedicle screw; Left L3 7.5x61mm Revision fusion, L5-S1, posterolateral Extension fusion to the ilium, placement of S2 AI bilateral screws, 8.5 x 90 mm, NuVasive Intraoperative use of autograft, same incision Intraoperative use of BMP, extra small Intraoperative use of neuro navigation, BrainLab Intraoperative use of CT, airo Intraoperative use of fluoroscopy  SURGEON: Dr. Pieter Partridge C. Kadie Balestrieri, DO  ASSISTANT: Dr. Duffy Rhody, MD  ANESTHESIA: General Endotracheal  EBL: 800 cc  SPECIMENS: None  DRAINS: Medium Hemovac  COMPLICATIONS: None   CONDITION: Hemodynamically stable  HISTORY: Harry Zamora is a 81 y.o. 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  He complained of continued worsening low back pain with neurogenic claudication.  CT myelogram revealed moderate to severe stenosis at L2-3 with degenerative disc disease and facet hypertrophy.  He failed conservative measures and therefore I offered him surgical intervention in the form of an L2-3 extension decompression and fusion, possible interbody fusion, revision of hardware at L5-S1 and revision of fusion at L5-S1, exploration of fusion at L3-4, L4-5, L5-S1 and extension to the  pelvis.  We discussed all risks, benefits and expected outcomes and he agreed to proceed.  Informed consent was obtained, medical clearance was obtained.  PROCEDURE IN DETAIL: The patient was brought to the operating room. After induction of general anesthesia, the patient was positioned on the operative table in the prone position. All pressure points were meticulously padded. Skin incision was then marked out and prepped and draped in the usual sterile fashion.  Using a 10 blade, midline incision was created over the L2 lamina and the previous scar sharply.  Using Bovie electrocautery, sharp dissection was performed subperiosteally exposing the L2 lamina bilaterally, L 2- 3 facet bilaterally, L2 transverse process bilaterally.  The dissection was carried laterally to expose the previous hardware at L3, L4, L5, S1 bilaterally.  There was obvious metallosis along the L5-S1 level concerning for nonunion.  L3-4, L4-5 appeared fused bilaterally.  The previous setscrews were removed and the rods were removed.  The S1 and S2 neuroforamen were identified.  The reference array was placed on the L2 spinous process.  Intraoperative CT for neuro navigation was obtained and verified to have excellent accuracy.  Using neuro navigation, pilot hole was created for bilateral S2 AI screws between the S1 and S2 neuroforamina.  These were tapped and felt with a ball-tipped probe with bony borders in all directions and a bony floor and the above listed screws were placed with appropriate bony purchase.  The fractured right S1 pedicle screw was removed.  A new trajectory was selected and a pilot hole was created with a high-speed drill.  Using a navigated pedicle finder, the S1 pedicle was accessed.  Ball-tipped probe confirmed bony borders in all directions.  The above listed pedicle screw was placed in the right S1 pedicle screw with appropriate bony purchase.  Pilot holes were then  created bilaterally and the L2 pedicles were  accessed using neuro navigation pedicle probe.  Ball-tipped feeler confirmed appropriate bony borders.  The above listed pedicle screws were placed with appropriate bony purchase.  Attention was then turned to the L2-3 decompression and interbody fusion.  Using a high-speed drill, a hemilaminectomy was performed on the right at L2 down to the ligamentum flavum.  This was continued laterally and a complete facetectomy was performed on the left.  This autograft was saved for later use. The left L3 pedicle screw was removed for visualization. Using micro curettes and Kerrison rongeurs the ligamentum flavum was gently elevated from the epidural space and removed along the lateral thecal sac and neuroforamina.  The traversing and negative nerve root were identified.  The epidural space was coagulated and cut and the disc was identified.  The annulus was cut sharply.  Using a series of Kerrison rongeurs the annulotomy was widened.  Using a series of disc prep curettes and shavers, radical discectomy was performed.  The endplates were cleared of cartilaginous tissue to subchondral bleeding bone.  The interbody was then sized to the above listed interbody.  A mixture of autograft and BMP was placed anterior to the interbody device.  Using lateral fluoroscopy, the above listed interbody was placed while gently retracting the lateral thecal sac.  Lateral fluoroscopy and AP fluoroscopy confirmed appropriate placement of the interbody device.  The posterior and lateral disc base was then filled with further autograft.  Epidural hemostasis was achieved with Surgifoam and cottonoid.  Cottonoid was removed. The L3 pedicle screw was then upsized in diameter and replaced. This had appropriate bony purchase.  The epidural space was explored with a ball-tipped probe and the exiting and traversing nerve roots were noted to be appropriately decompressed and pulsatile.  Attention was then turned to decortication in which the  transverse processes at L2-3 were decorticated bilaterally as well as the remaining right L2-3 facet.  The remaining right L5-S1 facet was also decorticated with a high-speed drill.  A mixture of autograft and allograft was placed in the lateral gutters at L2-3, L3-4, L4-5, L5-S1 and along the S1-S2AI screws.  Hemostasis was achieved with bipolar cautery.  5.5 mm titanium rods were then selected, cut and contoured to the appropriate length and placed, setscrews were placed and then final tightened to the manufacturer recommendation bilaterally.  Retractors were taken out of the wound.  Hemostasis was achieved with bipolar cautery.  A medium Hemovac was tunneled laterally.  The wound was then closed in layers, 0 Vicryl for fascia and muscle.  2-0 Vicryl was used to close the dermis and the skin was closed with staples.  Sterile dressing was applied.  At the end of the case all sponge, needle, and instrument counts were correct. The patient was then transferred to the stretcher, extubated, and taken to the post-anesthesia care unit in stable hemodynamic condition.

## 2021-04-24 NOTE — Anesthesia Procedure Notes (Signed)
Arterial Line Insertion Start/End2/11/2021 7:15 AM, 04/24/2021 7:25 AM Performed by: Santa Lighter, MD, Genelle Bal, CRNA  Patient location: Pre-op. Preanesthetic checklist: patient identified, IV checked, site marked, risks and benefits discussed, surgical consent, monitors and equipment checked, pre-op evaluation, timeout performed and anesthesia consent Lidocaine 1% used for infiltration Right, radial was placed Catheter size: 20 G Hand hygiene performed  and maximum sterile barriers used   Attempts: 1 Procedure performed without using ultrasound guided technique. Following insertion, dressing applied. Post procedure assessment: normal and unchanged  Patient tolerated the procedure well with no immediate complications.

## 2021-04-24 NOTE — Anesthesia Postprocedure Evaluation (Signed)
Anesthesia Post Note  Patient: Harry Zamora [facility]  Procedure(s) Performed: Decompression and Fusion Lumbar Two-Three with Posterior Lumbar Interbody Fusion with exploration/revision of Lumbar Three to Sacral One with Pelvic Screw Fixation (Spine Lumbar) APPLICATION OF INTRAOPERATIVE CT SCAN     Patient location during evaluation: PACU Anesthesia Type: General Level of consciousness: awake and alert Pain management: pain level controlled Vital Signs Assessment: post-procedure vital signs reviewed and stable Respiratory status: spontaneous breathing, nonlabored ventilation, respiratory function stable and patient connected to nasal cannula oxygen Cardiovascular status: blood pressure returned to baseline and stable Postop Assessment: no apparent nausea or vomiting Anesthetic complications: no   No notable events documented.  Last Vitals:  Vitals:   04/24/21 1645 04/24/21 2000  BP: (!) 110/59 119/67  Pulse: (!) 102 80  Resp: 18 16  Temp: 36.6 C 36.8 C  SpO2: 94% 95%    Last Pain:  Vitals:   04/24/21 2000  TempSrc: Oral  PainSc:                  Santa Lighter

## 2021-04-24 NOTE — H&P (Addendum)
Providing Compassionate, Quality Care - Together  NEUROSURGERY HISTORY & PHYSICAL   Harry Zamora is an 81 y.o. male.   Chief Complaint: Low back pain with neurogenic claudication HPI: This is an 81 year old male with a history of L3-S1 fusion, with complaints of worsening low back pain, difficulty standing for short periods of time and walking for short periods of time.  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  We discussed surgical intervention in the form of an L2-3 decompression, possible interbody fusion, extension of pedicle screw instrumentation to L2 as well as to the ilium for revision fusion and exploration of fusion at L5-S1 and exploration of fusion at L3-4 and L4-5.  He received medical clearance and has been off his aspirin and Plavix.  Past Medical History:  Diagnosis Date   Anemia    as an infant   Arthritis    Back pain    Depression    Disc displacement, lumbar    Gynecomastia    History of kidney stones    Hyperlipidemia    Hypertension    no longer on medications   Hypertriglyceridemia    Insomnia    Low back pain    Lumbar radiculopathy    Lumbar stenosis    OA (osteoarthritis)    Polyneuropathy in diabetes(357.2)    Prostate cancer ([place]) 2003   Restless legs    Ringing in ears    RLS (restless legs syndrome)    Type 2 diabetes mellitus ([place])     Past Surgical History:  Procedure Laterality Date   ABDOMINAL EXPOSURE N/A 07/13/2019   Procedure: ABDOMINAL EXPOSURE;  Surgeon: Serafina Mitchell, MD;  Location: [place];  Service: Vascular;  Laterality: N/A;  anterior approach   ANTERIOR LAT LUMBAR FUSION Right 07/13/2019   Procedure: Right Lumbar 3-4 Lumbar 4-5 Anterolateral lumbar interbody fusion;  Surgeon: Erline Levine, MD;  Location: [facility];  Service: Neurosurgery;  Laterality: Right;   ANTERIOR LUMBAR FUSION N/A 07/13/2019   Procedure: Lumbar Five-Sacral One  Anterior lumbar interbody fusion;  Surgeon: Erline Levine, MD;  Location: [place];  Service: Neurosurgery;  Laterality: N/A;  Anterior approach   CHOLECYSTECTOMY     COLONOSCOPY     CORONARY STENT INTERVENTION N/A 10/01/2020   Procedure: CORONARY STENT INTERVENTION;  Surgeon: Nigel Mormon, MD;  Location: [facility];  Service: Cardiovascular;  Laterality: N/A;   EYE SURGERY Bilateral    GALLBLADDER SURGERY     HEMORRHOID SURGERY  1983   LEFT HEART CATH AND CORONARY ANGIOGRAPHY N/A 09/17/2020   Procedure: LEFT HEART CATH AND CORONARY ANGIOGRAPHY;  Surgeon: Nigel Mormon, MD;  Location: [facility];  Service: Cardiovascular;  Laterality: N/A;   LUMBAR LAMINECTOMY/DECOMPRESSION MICRODISCECTOMY N/A 10/02/2016   Procedure: L3 to S1 Laminectomy;  Surgeon: Erline Levine, MD;  Location: [facility];  Service: Neurosurgery;  Laterality: N/A;  L3 to S1 Laminectomy   LUMBAR LAMINECTOMY/DECOMPRESSION MICRODISCECTOMY Bilateral 09/07/2019   Procedure: Bilateral Lumbar Five - Sacral One Foraminotomy;  Surgeon: Erline Levine, MD;  Location: [place];  Service: Neurosurgery;  Laterality: Bilateral;  posterior   LUMBAR PERCUTANEOUS PEDICLE SCREW 3 LEVEL N/A 07/13/2019   Procedure: Percutaneous pedicle screw fixation from Lumbar 3 to Sacral 1;  Surgeon: Erline Levine, MD;  Location: [place];  Service: Neurosurgery;  Laterality: N/A;   MENISCUS REPAIR Right    PROSTATE SURGERY  SHOULDER SURGERY Left 01/2011   rotator cuff    Family History  Problem Relation Age of Onset   Arthritis Mother    Hypertension Mother    Diabetes Mother    Heart attack Mother    Cancer Father    Social History:  reports that he quit smoking about 13 months ago. His smoking use included cigars. He has never used smokeless tobacco. He reports that he does not currently use alcohol. He reports that he does not use drugs.  Allergies:  Allergies  Allergen Reactions   Atorvastatin Other (See Comments)   Levaquin  [Levofloxacin In D5w] Rash   Niaspan [Niacin Er] Rash and Other (See Comments)    Flushing reaction   Tramadol Rash    Medications Prior to Admission  Medication Sig Dispense Refill   buPROPion (WELLBUTRIN SR) 150 MG 12 hr tablet Take 150 mg by mouth daily.     clopidogrel (PLAVIX) 75 MG tablet Take 1 tablet (75 mg total) by mouth daily. 90 tablet 3   Dulaglutide (TRULICITY) 3 SN/0.5LZ SOPN Inject 3 mg into the skin once a week. 6 mL 3   FARXIGA 10 MG TABS tablet Take 10 mg by mouth daily.      gabapentin (NEURONTIN) 600 MG tablet Take 600 mg by mouth 2 (two) times daily.     metFORMIN (GLUCOPHAGE) 500 MG tablet Take 2 tablets (1,000 mg total) by mouth 2 (two) times daily with a meal. Resume 10/03/2020  1   metoprolol succinate (TOPROL-XL) 25 MG 24 hr tablet TAKE 1 TABLET(25 MG) BY MOUTH DAILY (Patient taking differently: 25 mg daily.) 90 tablet 0   nitroGLYCERIN (NITROSTAT) 0.4 MG SL tablet Place 1 tablet (0.4 mg total) under the tongue every 5 (five) minutes as needed for chest pain. 30 tablet 3   rOPINIRole (REQUIP) 4 MG tablet Take 4 mg by mouth at bedtime.     rosuvastatin (CRESTOR) 40 MG tablet Take 1 tablet (40 mg total) by mouth daily. 90 tablet 3   vitamin B-12 (CYANOCOBALAMIN) 500 MCG tablet Take 500 mcg by mouth daily.      Results for orders placed or performed during the hospital encounter of 04/22/21 (from the past 48 hour(s))  Glucose, capillary     Status: Abnormal   Collection Time: 04/22/21  9:01 AM  Result Value Ref Range   Glucose-Capillary 127 (H) 70 - 99 mg/dL    Comment: Glucose reference range applies only to samples taken after fasting for at least 8 hours.  Basic metabolic panel per protocol     Status: Abnormal   Collection Time: 04/22/21  9:29 AM  Result Value Ref Range   Sodium 138 135 - 145 mmol/L   Potassium 4.9 3.5 - 5.1 mmol/L   Chloride 100 98 - 111 mmol/L   CO2 30 22 - 32 mmol/L   Glucose, Bld 145 (H) 70 - 99 mg/dL    Comment: Glucose reference  range applies only to samples taken after fasting for at least 8 hours.   BUN 15 8 - 23 mg/dL   Creatinine, Ser 1.02 0.61 - 1.24 mg/dL   Calcium 9.2 8.9 - 10.3 mg/dL   GFR, Estimated >60 >60 mL/min    Comment: (NOTE) Calculated using the CKD-EPI Creatinine Equation (2021)    Anion gap 8 5 - 15    Comment: Performed at [facility] [address], [place], [place] [address]  CBC per protocol     Status: Abnormal  Collection Time: 04/22/21  9:29 AM  Result Value Ref Range   WBC 6.6 4.0 - 10.5 K/uL   RBC 4.14 (L) 4.22 - 5.81 MIL/uL   Hemoglobin 12.6 (L) 13.0 - 17.0 g/dL   HCT 38.9 (L) 39.0 - 52.0 %   MCV 94.0 80.0 - 100.0 fL   MCH 30.4 26.0 - 34.0 pg   MCHC 32.4 30.0 - 36.0 g/dL   RDW 14.1 11.5 - 15.5 %   Platelets 165 150 - 400 K/uL   nRBC 0.0 0.0 - 0.2 %    Comment: Performed at [facility], [place] [address] [place], [place] [address]  Surgical PCR Screen     Status: Abnormal   Collection Time: 04/22/21  9:30 AM   Specimen: Nasal Mucosa; Nasal Swab  Result Value Ref Range   MRSA, PCR NEGATIVE NEGATIVE   Staphylococcus aureus POSITIVE (A) NEGATIVE    Comment: (NOTE) The Xpert SA Assay (FDA approved for NASAL specimens in patients 50 years of age and older), is one component of a comprehensive surveillance program. It is not intended to diagnose infection nor to guide or monitor treatment. Performed at [facility], [place] [address] [place], [place] [address]   SARS CORONAVIRUS 2 (TAT 6-24 HRS) Nasopharyngeal Nasopharyngeal Swab     Status: None   Collection Time: 04/22/21  9:31 AM   Specimen: Nasopharyngeal Swab  Result Value Ref Range   SARS Coronavirus 2 NEGATIVE NEGATIVE    Comment: (NOTE) SARS-CoV-2 target nucleic acids are NOT DETECTED.  The SARS-CoV-2 RNA is generally detectable in upper and lower respiratory specimens during the acute phase of infection. Negative results do not preclude SARS-CoV-2 infection, do not rule out co-infections with  other pathogens, and should not be used as the sole basis for treatment or other patient management decisions. Negative results must be combined with clinical observations, patient history, and epidemiological information. The expected result is Negative.  Fact Sheet for Patients: SugarRoll.be  Fact Sheet for Healthcare Providers: https://www.woods-mathews.com/  This test is not yet approved or cleared by the [place] FDA and  has been authorized for detection and/or diagnosis of SARS-CoV-2 by FDA under an Emergency Use Authorization (EUA). This EUA will remain  in effect (meaning this test can be used) for the duration of the COVID-19 declaration under Se ction 564(b)(1) of the Act, 21 U.S.C. section 360bbb-3(b)(1), unless the authorization is terminated or revoked sooner.  Performed at [facility], [place] [address], [place], [place] [address]   Type and screen [facility]     Status: None   Collection Time: 04/22/21  9:48 AM  Result Value Ref Range   ABO/RH(D) O POS    Antibody Screen NEG    Sample Expiration 05/06/2021,2359    Extend sample reason      NO TRANSFUSIONS OR PREGNANCY IN THE PAST 3 MONTHS Performed at [facility], [place] [address] [place], [place] [address]    No results found.  ROS All positives and negatives are listed HPI above  Blood pressure (!) 159/84, pulse 73, temperature 97.6 F (36.4 C), temperature source Oral, resp. rate 18, height 6\' 2"  (1.88 m), weight 81.2 kg, SpO2 100 %. 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  Exploration  of fusion L3-S1 with revision of hardware at L5-S1 and extension of instrumentation to the ilium.  We  discussed all risks, benefits expected outcomes.  He received medical clearance.  Informed consent was obtained.  Thank you for allowing me to participate in this patient's care.  Please do not hesitate to call with questions or concerns.   Elwin Sleight, [facility] Cell: [phone]

## 2021-04-24 NOTE — Anesthesia Procedure Notes (Signed)
Procedure Name: Intubation Date/Time: 04/24/2021 8:44 AM Performed by: Genelle Bal, CRNA Pre-anesthesia Checklist: Patient identified, Emergency Drugs available, Suction available and Patient being monitored Patient Re-evaluated:Patient Re-evaluated prior to induction Oxygen Delivery Method: Circle system utilized Preoxygenation: Pre-oxygenation with 100% oxygen Induction Type: IV induction Ventilation: Mask ventilation without difficulty Laryngoscope Size: Glidescope and 3 Grade View: Grade I Tube type: Oral Tube size: 7.5 mm Number of attempts: 1 Airway Equipment and Method: Stylet and Video-laryngoscopy Placement Confirmation: ETT inserted through vocal cords under direct vision, positive ETCO2 and breath sounds checked- equal and bilateral Secured at: 22 cm Tube secured with: Tape Dental Injury: Teeth and Oropharynx as per pre-operative assessment  Difficulty Due To: Difficult Airway- due to anterior larynx, Difficult Airway- due to dentition and Difficult Airway- due to reduced neck mobility

## 2021-04-24 NOTE — Transfer of Care (Signed)
Immediate Anesthesia Transfer of Care Note  Patient: Harry Zamora [facility]  Procedure(s) Performed: Decompression and Fusion Lumbar Two-Three with Posterior Lumbar Interbody Fusion with exploration/revision of Lumbar Three to Sacral One with Pelvic Screw Fixation (Spine Lumbar) APPLICATION OF INTRAOPERATIVE CT SCAN  Patient Location: PACU  Anesthesia Type:General  Level of Consciousness: awake, alert  and oriented  Airway & Oxygen Therapy: Patient Spontanous Breathing and Patient connected to face mask oxygen  Post-op Assessment: Report given to RN and Post -op Vital signs reviewed and stable  Post vital signs: Reviewed and stable  Last Vitals:  Vitals Value Taken Time  BP 156/85 04/24/21 1453  Temp    Pulse 92 04/24/21 1455  Resp 20 04/24/21 1455  SpO2 100 % 04/24/21 1455  Vitals shown include unvalidated device data.  Last Pain:  Vitals:   04/24/21 0732  TempSrc:   PainSc: 5          Complications: No notable events documented.

## 2021-04-25 ENCOUNTER — Encounter (HOSPITAL_COMMUNITY): Payer: Self-pay | Admitting: Neurological Surgery

## 2021-04-25 LAB — GLUCOSE, CAPILLARY
Glucose-Capillary: 251 mg/dL — ABNORMAL HIGH (ref 70–99)
Glucose-Capillary: 181 mg/dL — ABNORMAL HIGH (ref 70–99)
Glucose-Capillary: 163 mg/dL — ABNORMAL HIGH (ref 70–99)

## 2021-04-25 LAB — CBC
HCT: 24.2 % — ABNORMAL LOW (ref 39.0–52.0)
Hemoglobin: 7.9 g/dL — ABNORMAL LOW (ref 13.0–17.0)
MCH: 30.9 pg (ref 26.0–34.0)
MCHC: 32.6 g/dL (ref 30.0–36.0)
MCV: 94.5 fL (ref 80.0–100.0)
Platelets: 138 10*3/uL — ABNORMAL LOW (ref 150–400)
RBC: 2.56 MIL/uL — ABNORMAL LOW (ref 4.22–5.81)
RDW: 14.5 % (ref 11.5–15.5)
WBC: 8.5 10*3/uL (ref 4.0–10.5)
nRBC: 0 % (ref 0.0–0.2)

## 2021-04-25 LAB — BASIC METABOLIC PANEL
Anion gap: 7 (ref 5–15)
BUN: 17 mg/dL (ref 8–23)
CO2: 28 mmol/L (ref 22–32)
Calcium: 7.9 mg/dL — ABNORMAL LOW (ref 8.9–10.3)
Chloride: 101 mmol/L (ref 98–111)
Creatinine, Ser: 1.22 mg/dL (ref 0.61–1.24)
GFR, Estimated: 60 mL/min — ABNORMAL LOW (ref >60)
Glucose, Bld: 257 mg/dL — ABNORMAL HIGH (ref 70–99)
Potassium: 4.8 mmol/L (ref 3.5–5.1)
Sodium: 136 mmol/L (ref 135–145)

## 2021-04-25 MED ORDER — INSULIN ASPART 100 UNIT/ML IJ SOLN
0.0000 [IU] | Freq: Three times a day (TID) | INTRAMUSCULAR | Status: DC
  Administered 2021-04-25: 8 [IU] via SUBCUTANEOUS
  Administered 2021-04-25 – 2021-04-26 (×2): 3 [IU] via SUBCUTANEOUS
  Administered 2021-04-26: 2 [IU] via SUBCUTANEOUS

## 2021-04-25 MED FILL — Thrombin For Soln 5000 Unit: CUTANEOUS | Qty: 5000 | Status: AC

## 2021-04-25 NOTE — Progress Notes (Addendum)
Subjective: Patient reports mild incisional pain. He is pleased with his postoperative status. No acute events overnight.   Objective: Vital signs in last 24 hours: Temp:  [97.8 F (36.6 C)-98.8 F (37.1 C)] 98.6 F (37 C) (02/10 0317) Pulse Rate:  [74-102] 74 (02/10 0317) Resp:  [11-20] 20 (02/10 0317) BP: (94-163)/(50-85) 94/50 (02/10 0317) SpO2:  [94 %-100 %] 97 % (02/10 0317)  Intake/Output from previous day: 02/09 0701 - 02/10 0700 In: 4122.5 [P.O.:360; I.V.:3562.5; IV Piggyback:200] Out: 2615 [Urine:1275; Drains:540; Blood:800] Intake/Output this shift: No intake/output data recorded.  Physical Exam: Patient is awake, A/O X 4, conversant, and in good spirits. They are in NAD and VSS. Doing well. Speech is fluent and appropriate. MAEW with good strength that is symmetric bilaterally. 5/5 BUE/BLE. Sensation to light touch is intact. PERLA, EOMI. CNs grossly intact. Dressing intact with old drainage that is marked. Incision is well approximated. Hemovac with approximately 415 ml of sanguineous  drainage.   Lab Results: Recent Labs    04/22/21 0929 04/24/21 1203 04/25/21 0258  WBC 6.6  --  8.5  HGB 12.6* 11.9* 7.9*  HCT 38.9* 35.0* 24.2*  PLT 165  --  138*   BMET Recent Labs    04/22/21 0929 04/24/21 1203 04/25/21 0258  NA 138 137 136  K 4.9 4.5 4.8  CL 100  --  101  CO2 30  --  28  GLUCOSE 145*  --  257*  BUN 15  --  17  CREATININE 1.02  --  1.22  CALCIUM 9.2  --  7.9*    Studies/Results: DG Lumbar Spine 2-3 Views  Result Date: 04/24/2021 CLINICAL DATA:  L3 through S1 fusion.  Fluoroscopy. EXAM: LUMBAR SPINE - 2-3 VIEW COMPARISON:  Lumbar spine radiographs 06/10/2020 FINDINGS: Images were performed intraoperatively without the presence of a radiologist. Fluoroscopic time: 31 seconds Dose: 15.2 mGy Surgical instrumentation points to the posterior aspect of the patient's L5 transpedicular screws. Note is made the patient has L3 through S1 posterior transpedicular  screws and associated intervertebral disc spacers, as on prior radiographs. On the third image the patient appears to be undergoing placement of bilateral sacroiliac fusion screws. The patient also appears to be undergoing placement of new L2 transpedicular screws. Please see intraoperative findings for further detail. IMPRESSION: L2 through S1 and bilateral trans sacroiliac joint fusion visualized on fluoroscopy. Electronically Signed   By: Yvonne Kendall M.D.   On: 04/24/2021 13:57   DG C-Arm 1-60 Min-No Report  Result Date: 04/24/2021 Fluoroscopy was utilized by the requesting physician.  No radiographic interpretation.    Assessment/Plan: Patient is post-op day 1 s/p L2-3 decompression, extension fusion with possible interbody fusion.  Exploration of fusion L3-S1 with revision of hardware at L5-S1 and extension of instrumentation to the ilium. He is recovering well and reports a significant reduction in his preoperative symptoms.  His only complaint is mild incisional discomfort. He has ambulated in his room with nursing staff. He is awaiting PT/OT evaluation. Continue working on pain control, mobility and ambulating patient.  -Leave Hemovac in place -PT/OT -Likely ready for discharge tomorrow   LOS: 1 day     Marvis Moeller, DNP, NP-C 04/25/2021, 7:57 AM  Addendum:  Patient seen and examined.  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  Okay to restart Plavix on postoperative day 10.  We will maintain Hemovac drain until lower output. -DC planning Sunday  Thank you for allowing me to participate in this patient's care.  Please do not hesitate to call with questions or concerns.   Elwin Sleight, [facility] Cell: [phone]

## 2021-04-25 NOTE — Evaluation (Signed)
Occupational Therapy Evaluation Patient Details Name: Harry Zamora MRN: [id number] DOB: August 25, 1940 Today's Date: 04/25/2021   History of Present Illness 81 year old M with PMH of combined CHF, CKD-3B, DM-2, CAD, PVD, HTN, HLD, RLS and anemia presenting with subjective fever, cough, nasal congestion and sore throat, and admitted with severe sepsis due to community-acquired pneumonia and RSV infection.  Since CXR consistent with RML and RLL infiltrate.  Full RVP panel positive for RSV.   Clinical Impression   Patient admitted for the diagnosis and procedure above.  PTA he used a [facility] for mobility, drove and generally needed no assist for ADL/IADL.  Currently experiencing mild post op pain, but doing well.  He was already up in the recliner, and had Pj's on.  Patient should progress well, and has adequate assist at home.  OT can follow up prior to home to ensure compliance with back precautions.        Recommendations for follow up therapy are one component of a multi-disciplinary discharge planning process, led by the attending physician.  Recommendations may be updated based on patient status, additional functional criteria and insurance authorization.   Follow Up Recommendations  No OT follow up    Assistance Recommended at Discharge Set up Supervision/Assistance  Patient can return home with the following      Functional Status Assessment  Patient has had a recent decline in their functional status and demonstrates the ability to make significant improvements in function in a reasonable and predictable amount of time.  Equipment Recommendations  None recommended by OT    Recommendations for Other Services       Precautions / Restrictions Precautions Precautions: Back Precaution Booklet Issued: Yes (comment) Precaution Comments: Issued by PT, and reviewed. Restrictions Weight Bearing Restrictions: No      Mobility Bed Mobility               General bed mobility  comments: Up in recliner    Transfers Overall transfer level: Needs assistance Equipment used: Rolling walker (2 wheels) Transfers: Sit to/from Stand, Bed to chair/wheelchair/BSC Sit to Stand: Supervision     Step pivot transfers: Supervision     General transfer comment: myoclonic jerks noted with simulated ADL and basic mobility.      Balance Overall balance assessment: Needs assistance Sitting-balance support: Feet supported, Single extremity supported Sitting balance-Leahy Scale: Good Sitting balance - Comments: Very stiff posture   Standing balance support: Reliant on assistive device for balance Standing balance-Leahy Scale: Fair                             ADL either performed or assessed with clinical judgement   ADL Overall ADL's : At baseline                                       General ADL Comments: Close to baseline, able to complete simulated lower body ADL with gait belt, initial Min Guard for sit to stand, then supervision.     Vision Baseline Vision/History: 1 Wears glasses Patient Visual Report: No change from baseline       Perception Perception Perception: Not tested   Praxis Praxis Praxis: Not tested    Pertinent Vitals/Pain Pain Assessment Pain Assessment: Faces Faces Pain Scale: Hurts little more Pain Location: Incision Pain Descriptors / Indicators: Tender Pain Intervention(s): Monitored during session  Hand Dominance Right   Extremity/Trunk Assessment Upper Extremity Assessment Upper Extremity Assessment: Overall WFL for tasks assessed   Lower Extremity Assessment Lower Extremity Assessment: Defer to PT evaluation   Cervical / Trunk Assessment Cervical / Trunk Assessment: Back Surgery   Communication Communication Communication: No difficulties   Cognition Arousal/Alertness: Awake/alert Behavior During Therapy: WFL for tasks assessed/performed Overall Cognitive Status: Within Functional  Limits for tasks assessed                                       General Comments   VSS    Exercises     Shoulder Instructions      Home Living Family/patient expects to be discharged to:: Private residence Living Arrangements: Spouse/significant other Available Help at Discharge: Family;Available 24 hours/day Type of Home: House Home Access: Stairs to enter [employer] of Steps: Threshold Entrance Stairs-Rails: None Home Layout: One level     Bathroom Shower/Tub: Occupational psychologist: Standard     Home Equipment: Conservation officer, nature (2 wheels);Cane - single point;Shower seat          Prior Functioning/Environment Prior Level of Function : Independent/Modified Independent;Working/employed             Mobility Comments: Has a SPC for mobility ADLs Comments: No assist with ADL/IADL at prior level        OT Problem List: Impaired balance (sitting and/or standing);Pain      OT Treatment/Interventions: Self-care/ADL training;Balance training;DME and/or AE instruction    OT Goals(Current goals can be found in the care plan section) Acute Rehab OT Goals Patient Stated Goal: Go home tomorrow OT Goal Formulation: With patient Time For Goal Achievement: 05/02/21 Potential to Achieve Goals: Good  OT Frequency: Min 2X/week    Co-evaluation              AM-PAC OT "6 Clicks" Daily Activity     Outcome Measure Help from another person eating meals?: None Help from another person taking care of personal grooming?: None Help from another person toileting, which includes using toliet, bedpan, or urinal?: A Little Help from another person bathing (including washing, rinsing, drying)?: A Little Help from another person to put on and taking off regular upper body clothing?: A Little Help from another person to put on and taking off regular lower body clothing?: A Little 6 Click Score: 20   End of Session Equipment Utilized During  Treatment: Rolling walker (2 wheels)  Activity Tolerance: Patient tolerated treatment well Patient left: in chair;with call bell/phone within reach  OT Visit Diagnosis: Unsteadiness on feet (R26.81)                Time: 7741-2878 OT Time Calculation (min): 15 min Charges:  OT General Charges $OT Visit: 1 Visit OT Evaluation $OT Eval Moderate Complexity: 1 Mod  04/25/2021  RP, OTR/L  Acute Rehabilitation Services  Office:  [phone]   Metta Clines 04/25/2021, 9:20 AM

## 2021-04-25 NOTE — TOC Initial Note (Signed)
Transition of Care [facility]) - Initial/Assessment Note    Patient Details  Name: Harry Zamora MRN: [id number] Date of Birth: 1940-07-25  Transition of Care [facility]) CM/SW Contact:    Angelita Ingles, RN Phone Number:[phone]  04/25/2021, 9:21 AM  Clinical Narrative:                  Transition of Care [facility]) Screening Note   Patient Details  Name: Harry Zamora Date of Birth: August 18, 1940   Transition of Care [facility]) CM/SW Contact:    Angelita Ingles, RN Phone Number: 04/25/2021, 9:21 AM    Transition of Care Department [facility]) has reviewed patient and no TOC needs have been identified at this time. We will continue to monitor patient advancement through interdisciplinary progression rounds. If new patient transition needs arise, please place a TOC consult.          Patient Goals and CMS Choice        Expected Discharge Plan and Services                                                Prior Living Arrangements/Services                       Activities of Daily Living Home Assistive Devices/Equipment: Eyeglasses, Cane (specify quad or straight), Dentures (specify type), Blood pressure cuff, CBG Meter, Scales ADL Screening (condition at time of admission) Patient's cognitive ability adequate to safely complete daily activities?: Yes Is the patient deaf or have difficulty hearing?: No Does the patient have difficulty seeing, even when wearing glasses/contacts?: No Does the patient have difficulty concentrating, remembering, or making decisions?: No Patient able to express need for assistance with ADLs?: Yes Does the patient have difficulty dressing or bathing?: No Independently performs ADLs?: Yes (appropriate for developmental age) Does the patient have difficulty walking or climbing stairs?: No Weakness of Legs: Both Weakness of Arms/Hands: None  Permission Sought/Granted                  Emotional Assessment              Admission  diagnosis:  Lumbar spinal stenosis [M48.061] Patient Active Problem List   Diagnosis Date Noted   Lumbar spinal stenosis 04/24/2021   Sepsis ([place]) 03/13/2021   AKI (acute kidney injury) ([place]) 03/13/2021   Elevated CK 03/13/2021   Severe sepsis ([place]) 03/13/2021   PNA (pneumonia) 03/12/2021   Restless legs    Acute respiratory failure with hypoxia ([place])    Coronary artery disease of native artery of native heart with stable angina pectoris ([place]) 09/19/2020   Abnormal stress test 09/16/2020   Hyperkalemia 09/12/2020   Acute renal failure superimposed on stage 3a chronic kidney disease ([place]) 09/12/2020   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[place]) 09/12/2020   Type 2 diabetes mellitus ([place])    Syncope and collapse 08/07/2020   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   PAD (peripheral artery disease) ([place]) 08/30/2019   Spondylolisthesis of lumbar region 07/13/2019   Spinal stenosis of lumbar region with neurogenic claudication 10/02/2016   Diabetic polyneuropathy ([place]) 11/10/2013   PCP:  [name], MD Pharmacy:   [facility], [place] [address] AT [facility] [address] [place] [address] Phone: [phone]  Fax: [phone]  [facility] [place], [place] [address] Phone: [phone] Fax: [phone]     Social Determinants of Health (SDOH) Interventions    Readmission Risk Interventions No flowsheet data found.

## 2021-04-25 NOTE — Evaluation (Signed)
Physical Therapy Evaluation Patient Details Name: Harry Zamora MRN: [id number] DOB: 01/10/1941 Today's Date: 04/25/2021  History of Present Illness  81 year old M with PMH of combined CHF, CKD-3B, DM-2, CAD, PVD, HTN, HLD, RLS and anemia presenting with subjective fever, cough, nasal congestion and sore throat, and admitted with severe sepsis due to community-acquired pneumonia and RSV infection.  Since CXR consistent with RML and RLL infiltrate.  Full RVP panel positive for RSV.  Clinical Impression  Pt presents to PT at or near his functional baseline. Pt demonstrates the ability to ambulate without physical assistance and reports gait quality to be "normal" for himself. Pt will benefit from continued ambulation during admission to maintain his current level of function. Pt denies mobility concerns at this time. PT has provided all education on back precautions with patient acknowledgement of understanding. PT recommends discharge home when medically ready. Acute PT signing off.       Recommendations for follow up therapy are one component of a multi-disciplinary discharge planning process, led by the attending physician.  Recommendations may be updated based on patient status, additional functional criteria and insurance authorization.  Follow Up Recommendations No PT follow up    Assistance Recommended at Discharge PRN  Patient can return home with the following  Assistance with cooking/housework    Equipment Recommendations None recommended by PT  Recommendations for Other Services       Functional Status Assessment Patient has not had a recent decline in their functional status     Precautions / Restrictions Precautions Precautions: Back Precaution Booklet Issued: Yes (comment) Precaution Comments: Issued by PT, and reviewed. Required Braces or Orthoses:  (no brace needed per orders) Restrictions Weight Bearing Restrictions: No      Mobility  Bed Mobility                     Transfers Overall transfer level: Independent                      Ambulation/Gait Ambulation/Gait assistance: Independent Gait Distance (Feet): 250 Feet Assistive device: None Gait Pattern/deviations: WFL(Within Functional Limits) Gait velocity: functional Gait velocity interpretation: 1.31 - 2.62 ft/sec, indicative of limited community ambulator   General Gait Details: steady step-through gait, mildly reduced gait speed  Stairs            Wheelchair Mobility    Modified Rankin (Stroke Patients Only)       Balance Overall balance assessment: No apparent balance deficits (not formally assessed) Sitting-balance support: No upper extremity supported, Feet supported Sitting balance-Leahy Scale: Good     Standing balance support: No upper extremity supported, During functional activity Standing balance-Leahy Scale: Good                               Pertinent Vitals/Pain Pain Assessment Pain Assessment: No/denies pain    Home Living Family/patient expects to be discharged to:: Private residence Living Arrangements: Spouse/significant other Available Help at Discharge: Family;Available 24 hours/day Type of Home: House Home Access: Stairs to enter Entrance Stairs-Rails: None Entrance Stairs-Number of Steps: Threshold   Home Layout: One level Home Equipment: Conservation officer, nature (2 wheels);Cane - single point;Shower seat      Prior Function Prior Level of Function : Independent/Modified Independent;Working/employed             Mobility Comments: Has a SPC for mobility ADLs Comments: No assist with ADL/IADL at prior level  Hand Dominance   Dominant Hand: Right    Extremity/Trunk Assessment   Upper Extremity Assessment Upper Extremity Assessment: Overall WFL for tasks assessed    Lower Extremity Assessment Lower Extremity Assessment: Defer to PT evaluation    Cervical / Trunk Assessment Cervical / Trunk  Assessment: Back Surgery  Communication   Communication: No difficulties  Cognition Arousal/Alertness: Awake/alert Behavior During Therapy: WFL for tasks assessed/performed Overall Cognitive Status: Within Functional Limits for tasks assessed                                          General Comments General comments (skin integrity, edema, etc.): VSS on RA    Exercises     Assessment/Plan    PT Assessment Patient does not need any further PT services  PT Problem List         PT Treatment Interventions      PT Goals (Current goals can be found in the Care Plan section)       Frequency       Co-evaluation               AM-PAC PT "6 Clicks" Mobility  Outcome Measure Help needed turning from your back to your side while in a flat bed without using bedrails?: None Help needed moving from lying on your back to sitting on the side of a flat bed without using bedrails?: None Help needed moving to and from a bed to a chair (including a wheelchair)?: None Help needed standing up from a chair using your arms (e.g., wheelchair or bedside chair)?: None Help needed to walk in hospital room?: None Help needed climbing 3-5 steps with a railing? : None 6 Click Score: 24    End of Session   Activity Tolerance: Patient tolerated treatment well Patient left: in chair;with call bell/phone within reach Nurse Communication: Mobility status PT Visit Diagnosis: Other abnormalities of gait and mobility (R26.89)    Time: 2836-6294 PT Time Calculation (min) (ACUTE ONLY): 16 min   Charges:   PT Evaluation $PT Eval Low Complexity: [place], PT, DPT Acute Rehabilitation Pager: [phone] Office [phone]   Zenaida Niece 04/25/2021, 9:32 AM

## 2021-04-26 LAB — GLUCOSE, CAPILLARY
Glucose-Capillary: 193 mg/dL — ABNORMAL HIGH (ref 70–99)
Glucose-Capillary: 138 mg/dL — ABNORMAL HIGH (ref 70–99)
Glucose-Capillary: 176 mg/dL — ABNORMAL HIGH (ref 70–99)

## 2021-04-26 MED ORDER — METHOCARBAMOL 500 MG PO TABS: 500.0000 mg | ORAL_TABLET | Freq: Four times a day (QID) | ORAL | 1 refills | Status: DC | PRN

## 2021-04-26 MED ORDER — DOCUSATE SODIUM 100 MG PO CAPS: 100.0000 mg | ORAL_CAPSULE | Freq: Two times a day (BID) | ORAL | 0 refills | Status: DC

## 2021-04-26 MED ORDER — CLOPIDOGREL BISULFATE 75 MG PO TABS: 75.0000 mg | ORAL_TABLET | Freq: Every day | ORAL | 3 refills | Status: DC

## 2021-04-26 MED ORDER — OXYCODONE-ACETAMINOPHEN 5-325 MG PO TABS: 1.0000 | ORAL_TABLET | ORAL | 0 refills | Status: DC | PRN

## 2021-04-26 NOTE — Progress Notes (Signed)
Pt discharge education and instructions completed with pt and spouse at bedside. Both voices understanding and denies any questions. Pt back incision honeycomb dsg remain unremarkable, clean, dry and intact. Pt IVs removed prior to discharge. Pt to pick up electronically sent prescriptions from preferred pharmacy on file. Pt discharge home with spouse to transport him home. Pt transported off unit via wheelchair with spouse and belongs to the side. Delia Heady RN

## 2021-04-26 NOTE — Progress Notes (Deleted)
Hemovac Removed 1600, tip intact with no drainage or discharge noted on tip and site. 40 mL of serosanguinous drainage emptied. Clean, dry, intact sterile dressing applied.

## 2021-04-26 NOTE — Discharge Summary (Signed)
Physician Discharge Summary     Providing Compassionate, Quality Care - Together   Patient ID: Harry Zamora MRN: [id number] DOB/AGE: 12-04-1940 81 y.o.  Admit date: 04/24/2021 Discharge date: 04/26/2021  Admission Diagnoses: lumbar spinal stenosis  Discharge Diagnoses:  Principal Problem:   Lumbar spinal stenosis   Discharged Condition: good  Hospital Course: Patient underwent an L2-3 TLIF by Dr. Reatha Armour on 04/24/2021. He was admitted to 4NP11 following recovery from anesthesia in the PACU. His postoperative course has been uncomplicated. He has worked with both physical and occupational therapies who feel the patient is ready for discharge home. He is ambulating independently and without difficulty. He is tolerating a normal diet. He is not having any bowel or bladder dysfunction. His pain is well-controlled with oral pain medication. He is ready for discharge home.   Consults: PT/OT  Significant Diagnostic Studies: radiology: DG Lumbar Spine 2-3 Views  Result Date: 04/24/2021 CLINICAL DATA:  L3 through S1 fusion.  Fluoroscopy. EXAM: LUMBAR SPINE - 2-3 VIEW COMPARISON:  Lumbar spine radiographs 06/10/2020 FINDINGS: Images were performed intraoperatively without the presence of a radiologist. Fluoroscopic time: 31 seconds Dose: 15.2 mGy Surgical instrumentation points to the posterior aspect of the patient's L5 transpedicular screws. Note is made the patient has L3 through S1 posterior transpedicular screws and associated intervertebral disc spacers, as on prior radiographs. On the third image the patient appears to be undergoing placement of bilateral sacroiliac fusion screws. The patient also appears to be undergoing placement of new L2 transpedicular screws. Please see intraoperative findings for further detail. IMPRESSION: L2 through S1 and bilateral trans sacroiliac joint fusion visualized on fluoroscopy. Electronically Signed   By: Yvonne Kendall M.D.   On: 04/24/2021 13:57   DG C-Arm  1-60 Min-No Report  Result Date: 04/24/2021 Fluoroscopy was utilized by the requesting physician.  No radiographic interpretation.     Treatments: surgery:  Open L2-3 decompression, transforaminal interbody (left-sided approach) and instrumentation and posterolateral fusion Nonsegmental pedicle screw instrumentation, L2, bilateral, nuvasive 7.5x48mm Placement of anterior biomechanical device, NuVasive 8 x 10 x 30 titanium interbody device, L2-3 Exploration of fusion L3-4, L4-5, L5-S1 Revision and placement of Nonsegmental pedicle screws, right S1, placement of 7.5 x 50 mm NuVasive pedicle screw; Left L3 7.5x2mm Revision fusion, L5-S1, posterolateral Extension fusion to the ilium, placement of S2 AI bilateral screws, 8.5 x 90 mm, NuVasive Intraoperative use of autograft, same incision Intraoperative use of BMP, extra small Intraoperative use of neuro navigation, BrainLab Intraoperative use of CT, airo Intraoperative use of fluoroscopy   Discharge Exam: Blood pressure (!) 106/49, pulse 82, temperature 98.2 F (36.8 C), resp. rate 18, height 6\' 2"  (1.88 m), weight 81.2 kg, SpO2 94 %.  Alert and oriented x 4 PERRLA CN II-XII grossly intact MAE, Strength and sensation intact Incision is covered with Honeycomb dressing; Dressing is clean, dry, and intact   Disposition: Discharge disposition: 01-Home or Self Care       Discharge Instructions     Incentive spirometry RT   Complete by: As directed       Allergies as of 04/26/2021       Reactions   Atorvastatin Other (See Comments)   Levaquin [levofloxacin In D5w] Rash   Niaspan [niacin Er] Rash, Other (See Comments)   Flushing reaction   Tramadol Rash        Medication List     TAKE these medications    buPROPion 150 MG 12 hr tablet Commonly known as: WELLBUTRIN SR Take 150 mg  by mouth daily.   clopidogrel 75 MG tablet Commonly known as: PLAVIX Take 1 tablet (75 mg total) by mouth daily. **Restart Plavix on  04/29/2021** What changed: additional instructions   docusate sodium 100 MG capsule Commonly known as: COLACE Take 1 capsule (100 mg total) by mouth 2 (two) times daily.   Farxiga 10 MG Tabs tablet Generic drug: dapagliflozin propanediol Take 10 mg by mouth daily.   gabapentin 600 MG tablet Commonly known as: NEURONTIN Take 600 mg by mouth 2 (two) times daily.   metFORMIN 500 MG tablet Commonly known as: GLUCOPHAGE Take 2 tablets (1,000 mg total) by mouth 2 (two) times daily with a meal. Resume 10/03/2020   methocarbamol 500 MG tablet Commonly known as: ROBAXIN Take 1 tablet (500 mg total) by mouth every 6 (six) hours as needed for muscle spasms.   metoprolol succinate 25 MG 24 hr tablet Commonly known as: TOPROL-XL TAKE 1 TABLET(25 MG) BY MOUTH DAILY What changed: See the new instructions.   nitroGLYCERIN 0.4 MG SL tablet Commonly known as: NITROSTAT Place 1 tablet (0.4 mg total) under the tongue every 5 (five) minutes as needed for chest pain.   oxyCODONE-acetaminophen 5-325 MG tablet Commonly known as: Percocet Take 1 tablet by mouth every 4 (four) hours as needed for severe pain.   rOPINIRole 4 MG tablet Commonly known as: REQUIP Take 4 mg by mouth at bedtime.   rosuvastatin 40 MG tablet Commonly known as: CRESTOR Take 1 tablet (40 mg total) by mouth daily.   Trulicity 3 DE/0.8XK Sopn Generic drug: Dulaglutide Inject 3 mg into the skin once a week.   vitamin B-12 500 MCG tablet Commonly known as: CYANOCOBALAMIN Take 500 mcg by mouth daily.        Follow-up Information     Dawley, Troy C, DO. Go on 05/12/2021.   Why: First post op appointment with x-rays is 05/12/2021 at 11:15 am. Contact information: [address] [place] [address] [phone]                 Signed: Viona Gilmore, DNP, AGNP-C Nurse Practitioner  [facility] [place]. [address], [address], [place], [place] [address] P:  [phone]     F: [phone]  04/26/2021, 2:27 PM

## 2021-04-26 NOTE — Discharge Instructions (Addendum)
**  Restart Plavix on 04/29/2021**  Wound Care Keep incision covered and dry for two days. You are fine to shower. Pat incision dry. Do not put any creams, lotions, or ointments on incision.  Activity Walk each and every day, increasing distance each day. No lifting greater than 5 lbs.  Avoid excessive back motion. No driving for 2 weeks; may ride as a passenger locally.  Diet Resume your normal diet.   Return to Work Will be discussed at your follow up appointment.  Call Your Doctor If Any of These Occur Redness, drainage, or swelling at the wound.  Temperature greater than 101 degrees. Severe pain not relieved by pain medication. Incision starts to come apart.

## 2021-04-26 NOTE — Progress Notes (Signed)
Hemovac Removed at 1600, tip intact with no drainage or discharge noted on tip and site. 40 mL of serosanguinous drainage emptied. Clean, dry, intact sterile dressing applied.

## 2021-04-28 ENCOUNTER — Emergency Department (HOSPITAL_BASED_OUTPATIENT_CLINIC_OR_DEPARTMENT_OTHER): Payer: Medicare PPO

## 2021-04-28 ENCOUNTER — Other Ambulatory Visit: Payer: Self-pay

## 2021-04-28 ENCOUNTER — Encounter (HOSPITAL_BASED_OUTPATIENT_CLINIC_OR_DEPARTMENT_OTHER): Payer: Self-pay | Admitting: Emergency Medicine

## 2021-04-28 ENCOUNTER — Emergency Department (HOSPITAL_BASED_OUTPATIENT_CLINIC_OR_DEPARTMENT_OTHER)
Admission: EM | Admit: 2021-04-28 | Discharge: 2021-04-28 | Disposition: A | Discharge status: Home / Self Care | Payer: Medicare PPO | Attending: Emergency Medicine | Admitting: Emergency Medicine

## 2021-04-28 DIAGNOSIS — Z7901 Long term (current) use of anticoagulants: Secondary | ICD-10-CM | POA: Diagnosis not present

## 2021-04-28 DIAGNOSIS — R4182 Altered mental status, unspecified: Secondary | ICD-10-CM | POA: Diagnosis not present

## 2021-04-28 DIAGNOSIS — H538 Other visual disturbances: Secondary | ICD-10-CM | POA: Diagnosis not present

## 2021-04-28 DIAGNOSIS — R531 Weakness: Secondary | ICD-10-CM

## 2021-04-28 DIAGNOSIS — R718 Other abnormality of red blood cells: Secondary | ICD-10-CM | POA: Insufficient documentation

## 2021-04-28 LAB — CBC WITH DIFFERENTIAL/PLATELET
Abs Immature Granulocytes: 0.04 10*3/uL (ref 0.00–0.07)
Basophils Absolute: 0 10*3/uL (ref 0.0–0.1)
Basophils Relative: 0 %
Eosinophils Absolute: 0.7 10*3/uL — ABNORMAL HIGH (ref 0.0–0.5)
Eosinophils Relative: 10 %
HCT: 23.9 % — ABNORMAL LOW (ref 39.0–52.0)
Hemoglobin: 7.8 g/dL — ABNORMAL LOW (ref 13.0–17.0)
Immature Granulocytes: 1 %
Lymphocytes Relative: 18 %
Lymphs Abs: 1.3 10*3/uL (ref 0.7–4.0)
MCH: 30.8 pg (ref 26.0–34.0)
MCHC: 32.6 g/dL (ref 30.0–36.0)
MCV: 94.5 fL (ref 80.0–100.0)
Monocytes Absolute: 0.7 10*3/uL (ref 0.1–1.0)
Monocytes Relative: 9 %
Neutro Abs: 4.5 10*3/uL (ref 1.7–7.7)
Neutrophils Relative %: 62 %
Platelets: 203 10*3/uL (ref 150–400)
RBC: 2.53 MIL/uL — ABNORMAL LOW (ref 4.22–5.81)
RDW: 14.4 % (ref 11.5–15.5)
WBC: 7.1 10*3/uL (ref 4.0–10.5)
nRBC: 0 % (ref 0.0–0.2)

## 2021-04-28 LAB — COMPREHENSIVE METABOLIC PANEL
ALT: 14 U/L (ref 0–44)
AST: 24 U/L (ref 15–41)
Albumin: 3.4 g/dL — ABNORMAL LOW (ref 3.5–5.0)
Alkaline Phosphatase: 55 U/L (ref 38–126)
Anion gap: 8 (ref 5–15)
BUN: 22 mg/dL (ref 8–23)
CO2: 29 mmol/L (ref 22–32)
Calcium: 8.8 mg/dL — ABNORMAL LOW (ref 8.9–10.3)
Chloride: 96 mmol/L — ABNORMAL LOW (ref 98–111)
Creatinine, Ser: 1.1 mg/dL (ref 0.61–1.24)
GFR, Estimated: >60 mL/min (ref >60)
Glucose, Bld: 238 mg/dL — ABNORMAL HIGH (ref 70–99)
Potassium: 4.1 mmol/L (ref 3.5–5.1)
Sodium: 133 mmol/L — ABNORMAL LOW (ref 135–145)
Total Bilirubin: 0.5 mg/dL (ref 0.3–1.2)
Total Protein: 6.6 g/dL (ref 6.5–8.1)

## 2021-04-28 LAB — URINALYSIS, ROUTINE W REFLEX MICROSCOPIC
Bilirubin Urine: NEGATIVE
Glucose, UA: >1000 mg/dL — AB
Hgb urine dipstick: NEGATIVE
Ketones, ur: NEGATIVE mg/dL
Leukocytes,Ua: NEGATIVE
Nitrite: NEGATIVE
Specific Gravity, Urine: 1.029 (ref 1.005–1.030)
pH: 5.5 (ref 5.0–8.0)

## 2021-04-28 LAB — OCCULT BLOOD X 1 CARD TO LAB, STOOL: Fecal Occult Bld: NEGATIVE

## 2021-04-28 IMAGING — DX DG CHEST 1V PORT
2 series · 2 of 2 positions shown · non-contrast
Comparison: Chest radiograph [DATE]

CLINICAL DATA: Fall

EXAM:
PORTABLE CHEST 1 VIEW

[chest ap (1 of 2)]
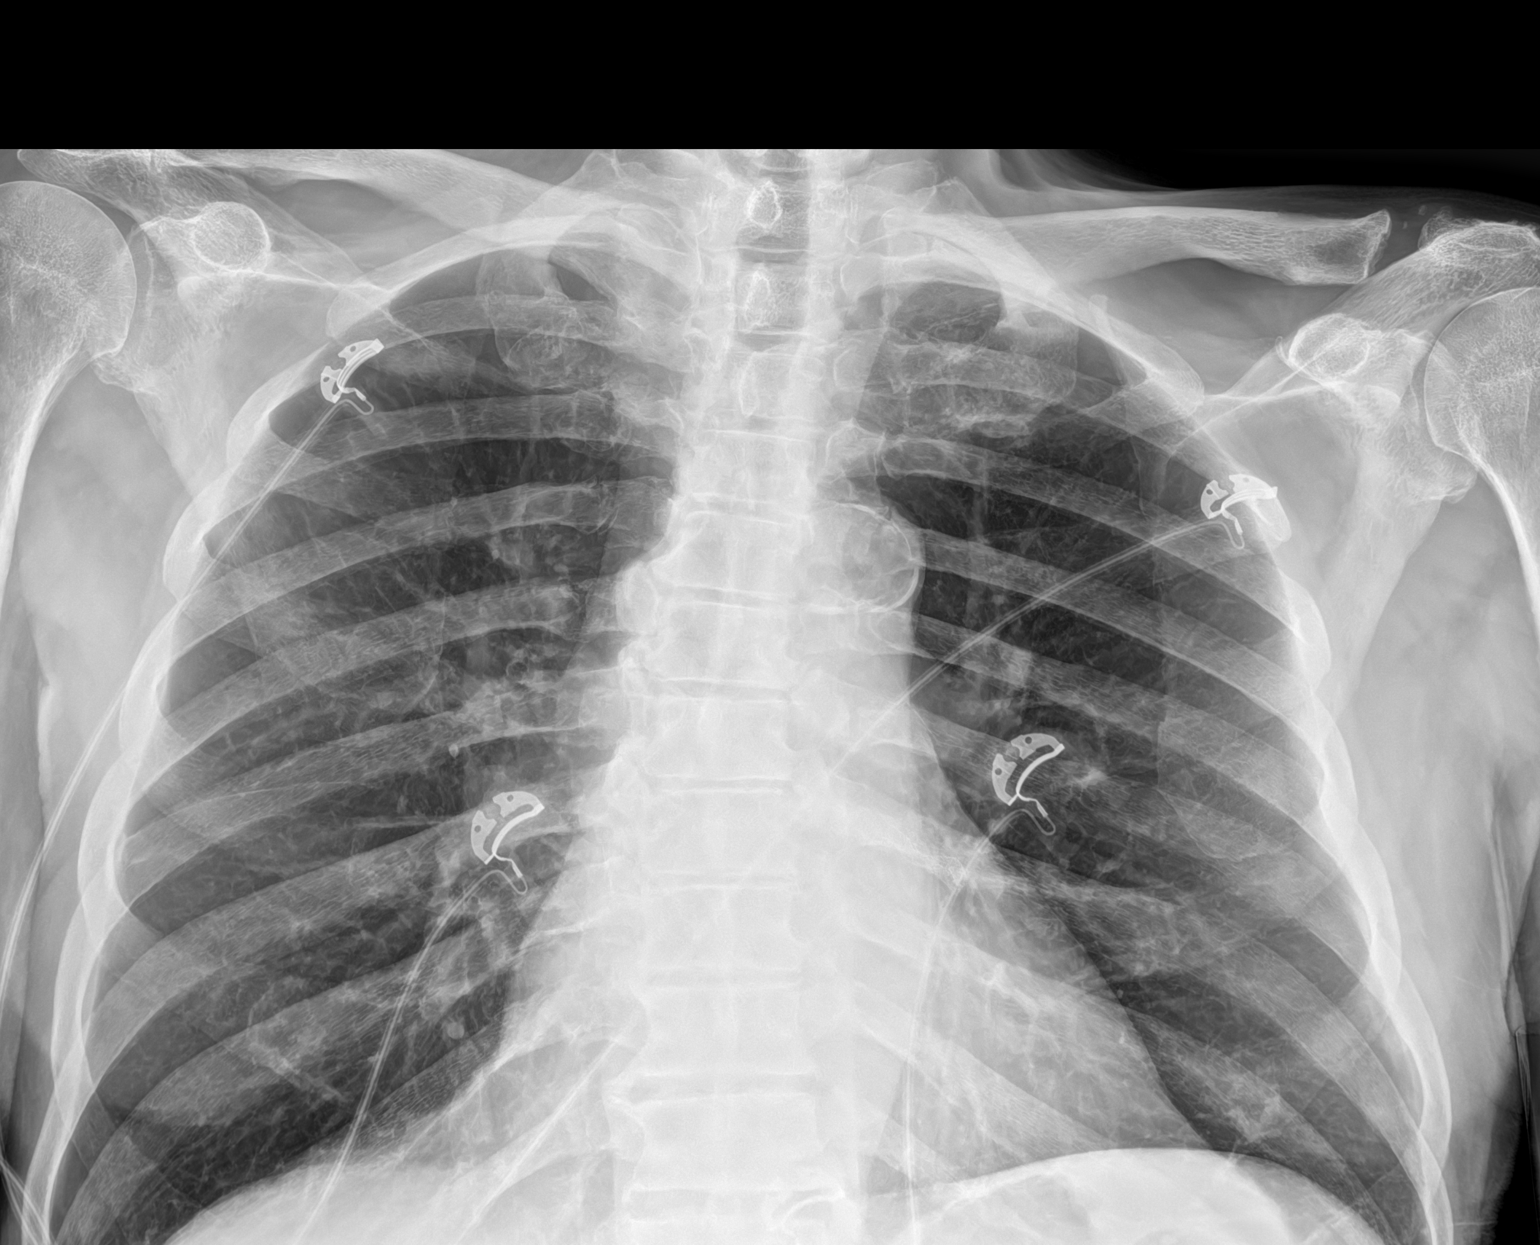

[chest ap (2 of 2)]
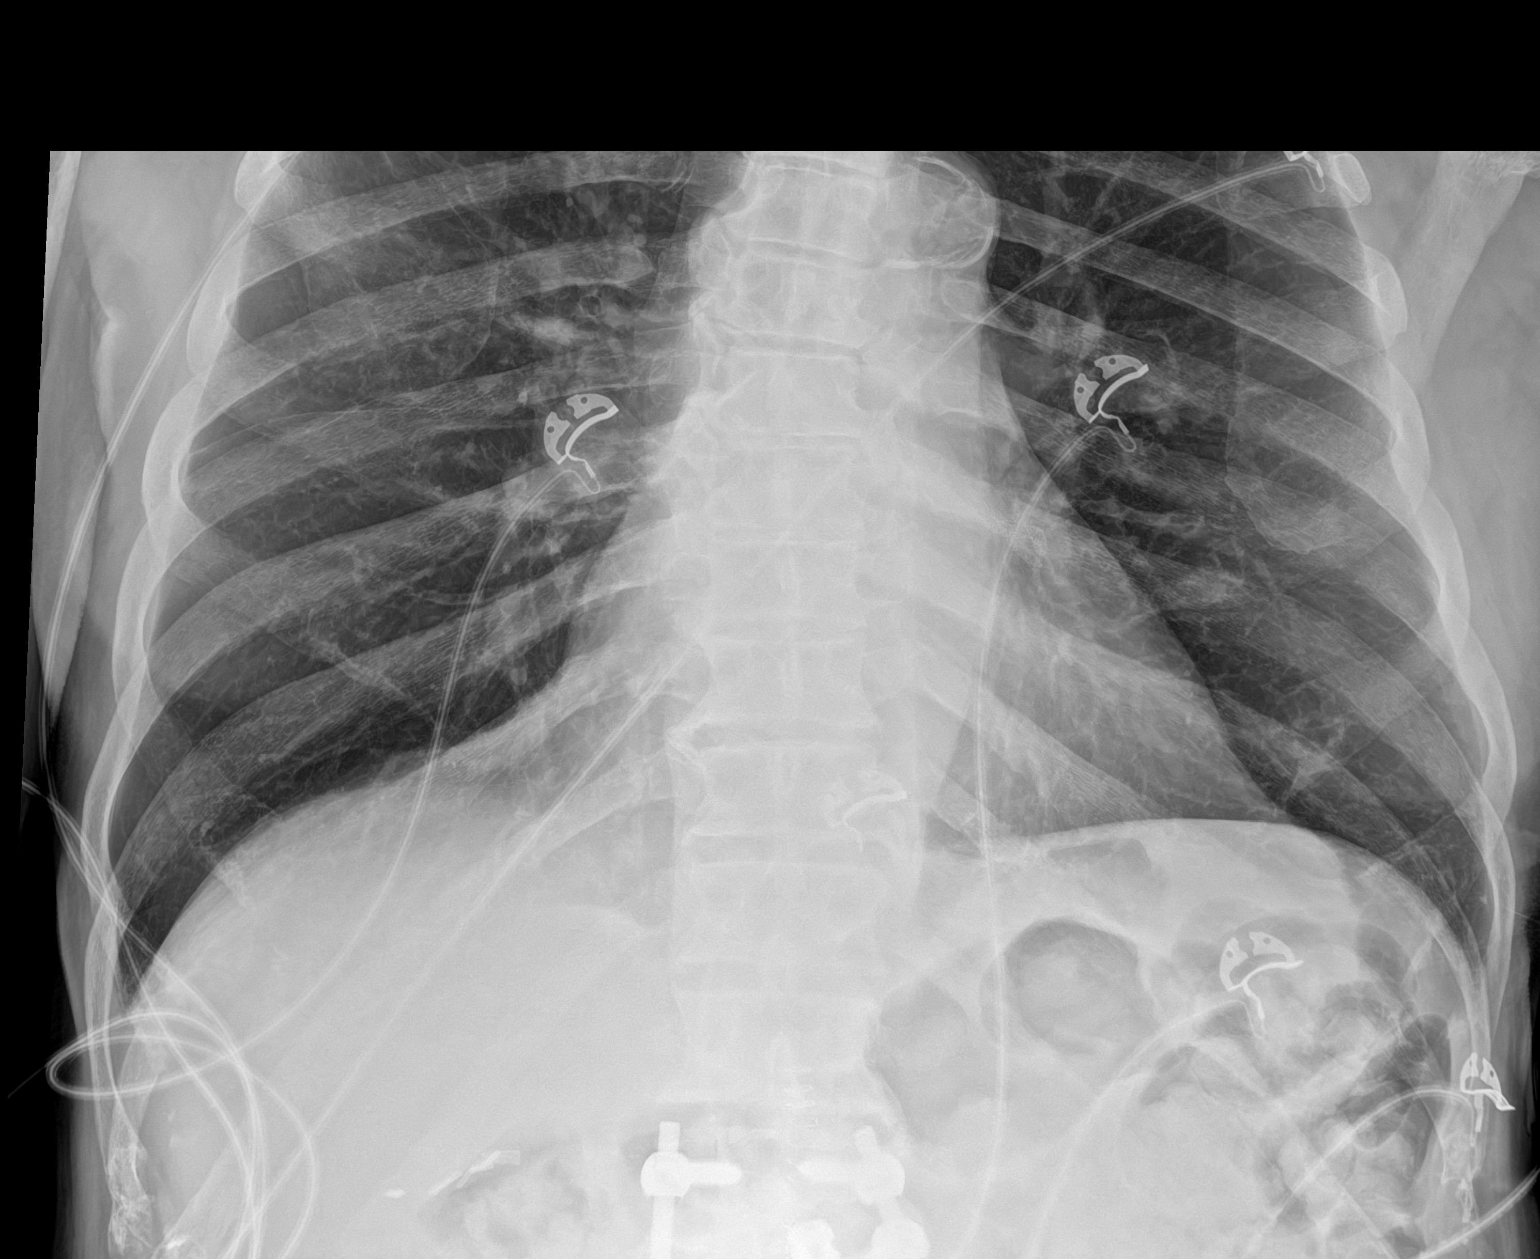

[2 of 2 positions shown; findings below may reference images not displayed]

FINDINGS: The cardiomediastinal silhouette is stable.

There is no focal consolidation or pulmonary edema. There is no
pleural effusion or pneumothorax.

There is no acute osseous abnormality. Lumbar spine fusion hardware
is partially imaged. Cholecystectomy clips are noted.
IMPRESSION: No radiographic evidence of acute cardiopulmonary process.

## 2021-04-28 IMAGING — CT CT HEAD W/O CM
4 series · 16 of 47 positions shown, 18 images · non-contrast
Comparison: [DATE]

CLINICAL DATA: Fall, head trauma, confusion



[Series 2: head wo · axial · 0.42mm/px · z∈[+903,+1018]mm · 7 of 31 slices shown, 9 images]
[im 4/31  brain]
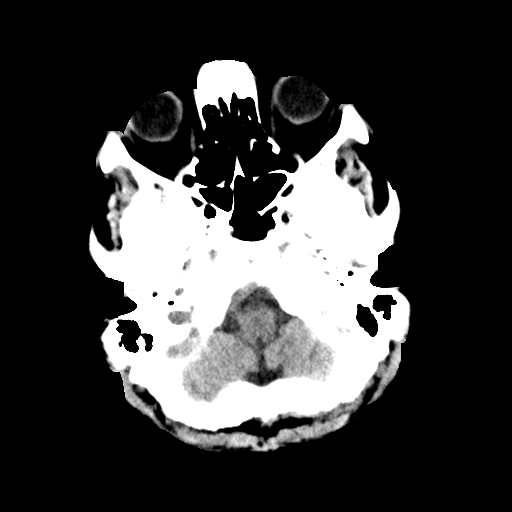
[im 4/31  bone]
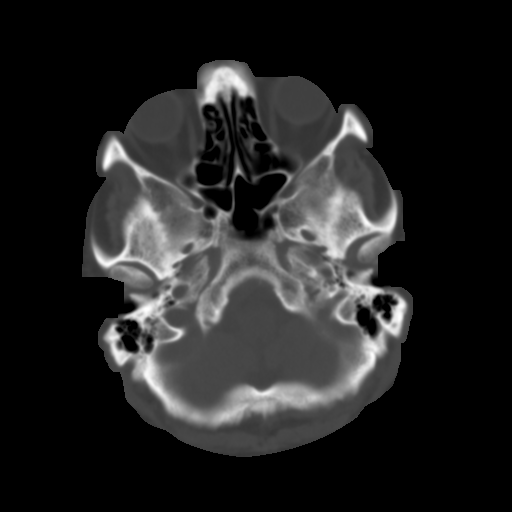
[im 8/31  brain]
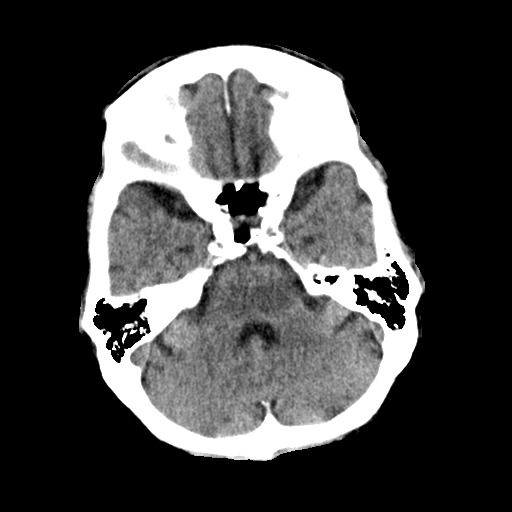
[im 12/31  brain]
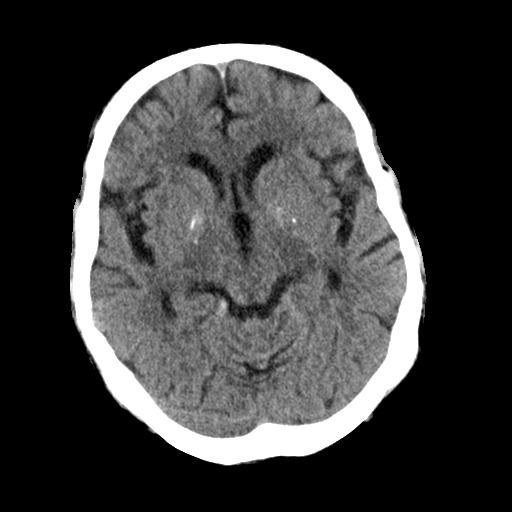
[im 16/31  brain]
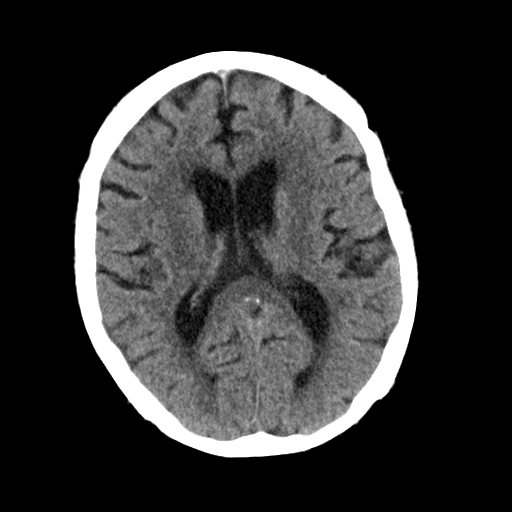
[im 19/31  brain]
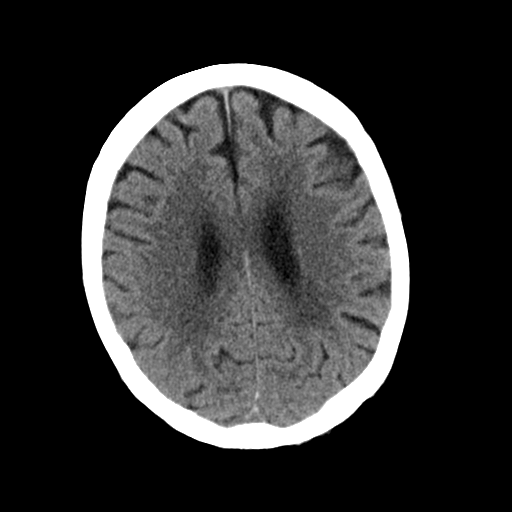
[im 19/31  bone]
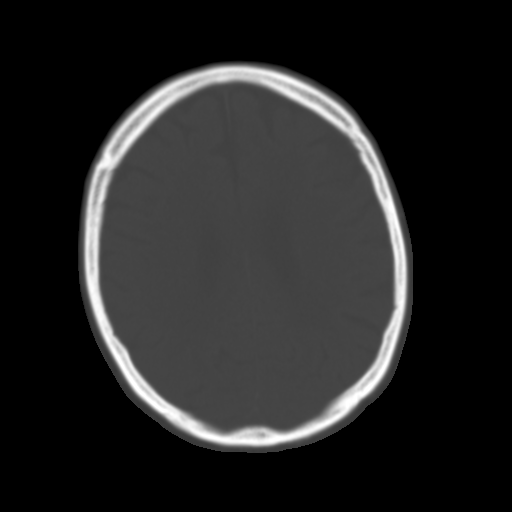
[im 23/31  brain]
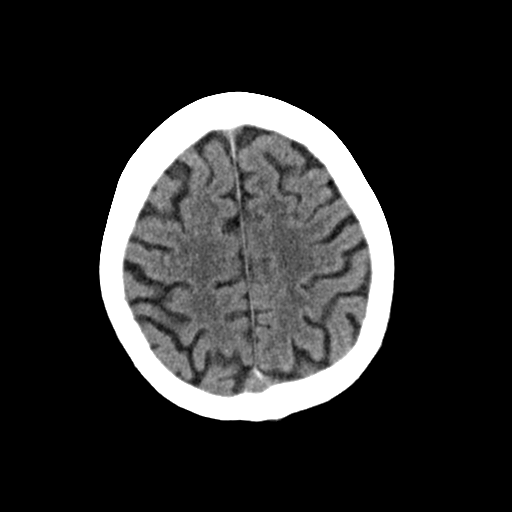
[im 27/31  brain]
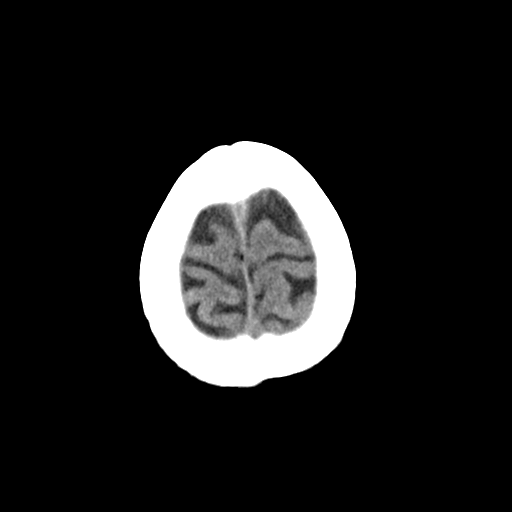

[Series 3: head bone · axial · 0.42mm/px · z∈[+902,+934]mm · 3 of 78 slices shown]
[im 8/78  bone]
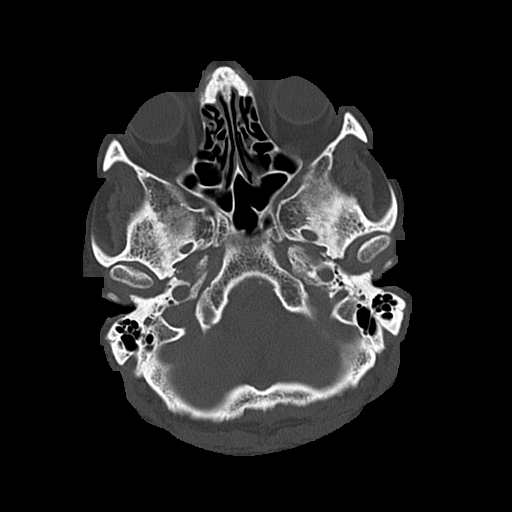
[im 16/78  bone]
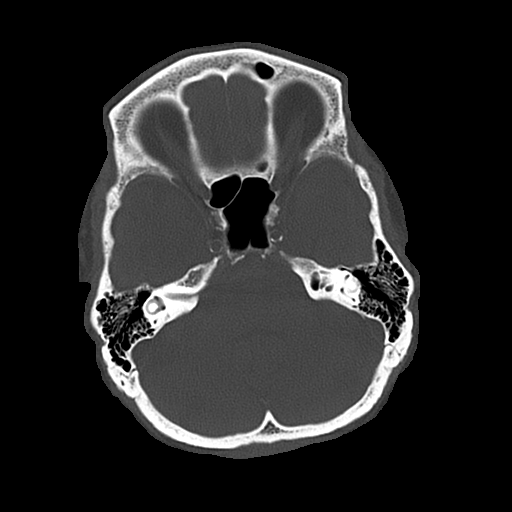
[im 24/78  bone]
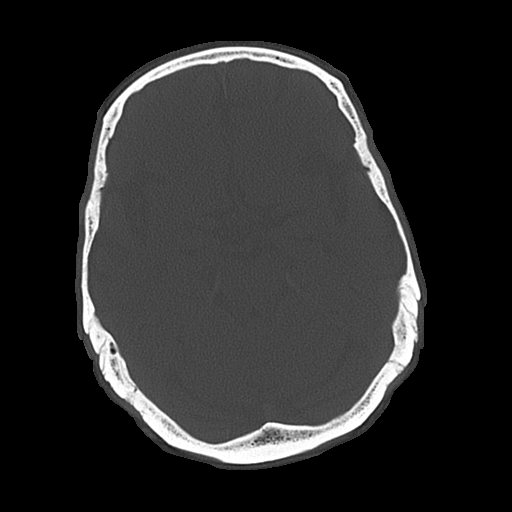

[Series 4: coronal soft · coronal · 0.33mm/px · 3 of 67 slices shown]
[im 23/67  brain]
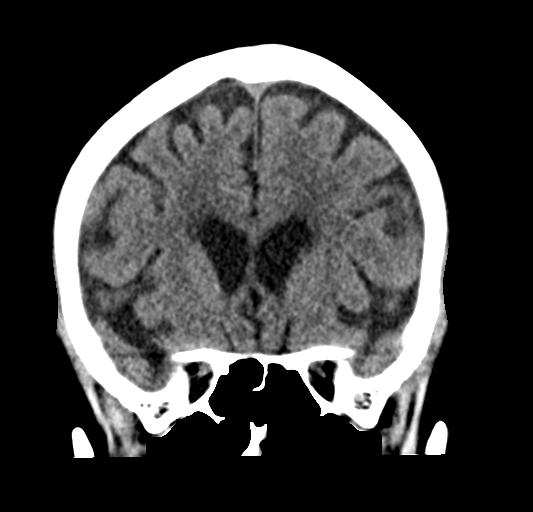
[im 30/67  brain]
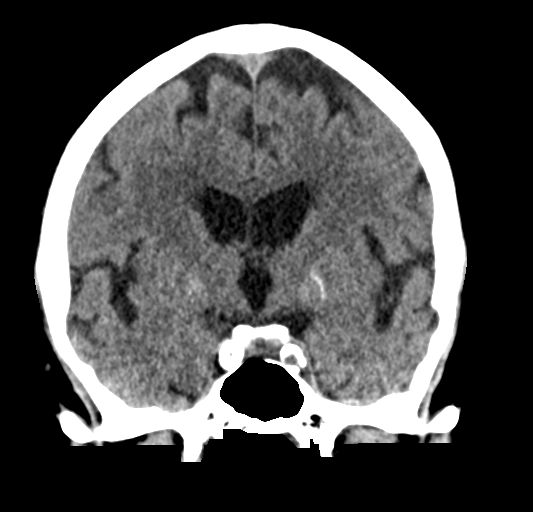
[im 37/67  brain]
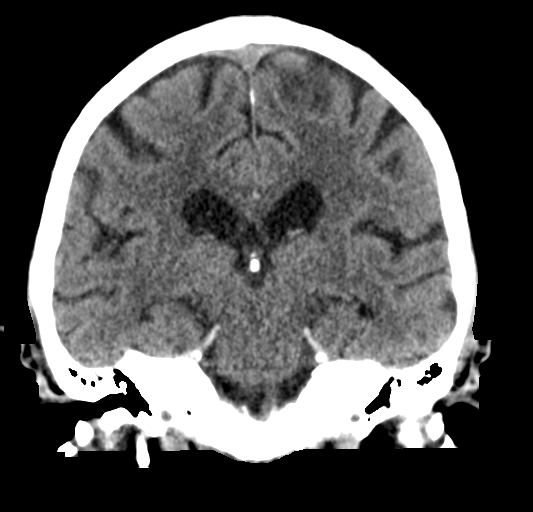

[Series 5: sagittal soft · sagittal · 0.33mm/px · 3 of 59 slices shown]
[im 20/59  brain]
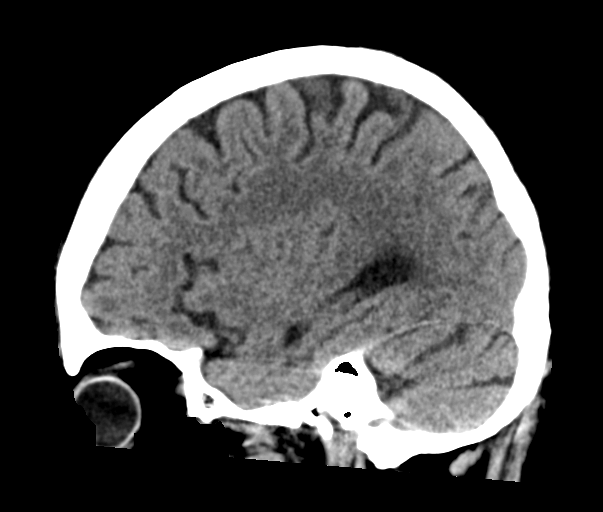
[im 30/59  brain]
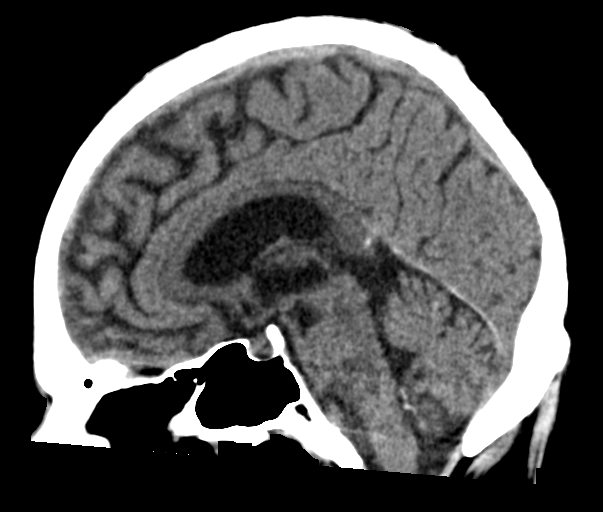
[im 39/59  brain]
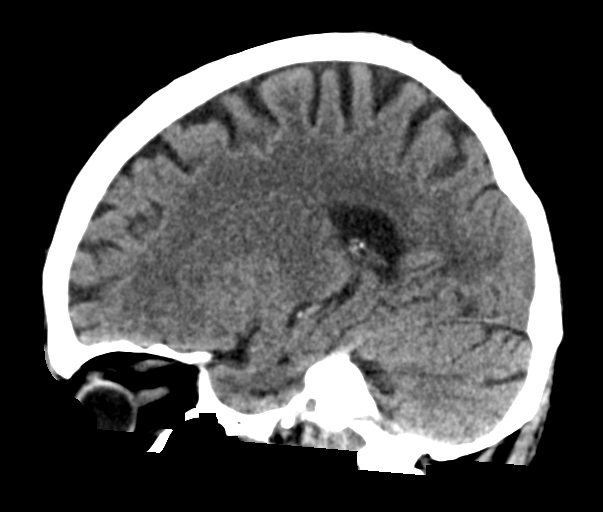

[16 of 47 positions shown; findings below may reference images not displayed]

FINDINGS: Brain: Stable atrophy pattern and mild white matter microvascular
ischemic changes throughout both cerebral hemispheres. No acute
intracranial hemorrhage, new mass lesion, new infarction, midline
shift, herniation, hydrocephalus, or extra-axial fluid collection.
No focal mass effect or edema. Cisterns are patent. No cerebellar
abnormality.

Vascular: Intracranial atherosclerosis at the skull base. No
hyperdense vessel.

Skull: Normal. Negative for fracture or focal lesion.

Sinuses/Orbits: No acute finding.

Other: None.
IMPRESSION: Stable atrophy and white matter microvascular ischemic changes.

No acute intracranial abnormality or interval change by noncontrast
CT.

## 2021-04-28 MED ORDER — SODIUM CHLORIDE 0.9 % IV BOLUS
1000.0000 mL | Freq: Once | INTRAVENOUS | Status: AC
  Administered 2021-04-28: 1000 mL via INTRAVENOUS

## 2021-04-28 NOTE — ED Triage Notes (Signed)
Pt from home lives with wife. Pt had back surgery on Feb 9th. Pt has gotten progressive more confused to time nd date and having balance issues. Pt had two falls yesterday but denis hitting head. Pt witnessed one of the falls Pt unsure if on blood thinners. Pt denis N/V but states that he has intermittent blurry vision for the past month.   Pt is currently oriented to self time location and place. Pt is calm and follows commands with out any problems.

## 2021-04-28 NOTE — Discharge Instructions (Signed)
If symptoms start getting worse including chest pain, shortness of breath, syncopal episodes please return as you may benefit from a blood transfusion as discussed.  But at this time blood counts appear to be stable.  Suspect that this will improve over the course of the week.  Follow-up with your primary care doctor.

## 2021-04-28 NOTE — ED Provider Notes (Signed)
[facility] DEPT Provider Note   CSN: [id number] Arrival date & time: 04/28/21  1647     History  Chief Complaint  Patient presents with   Altered Mental Status    Harry Zamora is a 81 y.o. male.  Generalized weakness and may be some confusion here the last several days.  Recently had low back surgery to fix some hardware in his low back.  He had 2 falls.  Not sure if he hit his head.  He is not on blood thinners.  Does take Plavix.  He has been using walker without much difficulty.  Denies any nausea or vomiting.  Denies any abdominal tenderness.  Has not had bowel movements but is passing gas.  Denies any pain on urination.  Maybe has had some blurry vision in the last several months.  Has history of cataract surgery in both eyes.  The history is provided by the patient.  Altered Mental Status Presenting symptoms comment:  Weakness, fatigue  Severity:  Mild Most recent episode:  2 days ago Timing:  Constant Progression:  Unchanged Chronicity:  New Associated symptoms: weakness   Associated symptoms: no abdominal pain, normal movement, no agitation, no bladder incontinence, no decreased appetite, no depression, no difficulty breathing, no eye deviation, no fever, no hallucinations, no headaches, no light-headedness, no nausea, no palpitations, no rash, no seizures, no slurred speech, no suicidal behavior, no visual change and no vomiting       Home Medications Prior to Admission medications   Medication Sig Start Date End Date Taking? Authorizing Provider  buPROPion (WELLBUTRIN SR) 150 MG 12 hr tablet Take 150 mg by mouth daily. 04/15/21  Yes [provider]  clopidogrel (PLAVIX) 75 MG tablet Take 1 tablet (75 mg total) by mouth daily. **Restart Plavix on 04/29/2021** 04/26/21  Yes Bergman, Trinda Pascal D, NP  Dulaglutide (TRULICITY) 3 WU/9.8JX SOPN Inject 3 mg into the skin once a week. 03/25/21  Yes   FARXIGA 10 MG TABS tablet Take 10 mg by mouth  daily.  05/23/19  Yes [provider]  gabapentin (NEURONTIN) 600 MG tablet Take 600 mg by mouth 2 (two) times daily.   Yes [provider]  meloxicam (MOBIC) 15 MG tablet Take 15 mg by mouth daily.   Yes [provider]  metFORMIN (GLUCOPHAGE) 500 MG tablet Take 2 tablets (1,000 mg total) by mouth 2 (two) times daily with a meal. Resume 10/03/2020 10/03/20  Yes Patwardhan, Manish J, MD  oxyCODONE-acetaminophen (PERCOCET) 5-325 MG tablet Take 1 tablet by mouth every 4 (four) hours as needed for severe pain. 04/26/21 04/26/22 Yes Viona Gilmore D, NP  pregabalin (LYRICA) 75 MG capsule Take 75 mg by mouth 2 (two) times daily.   Yes [provider]  rOPINIRole (REQUIP) 4 MG tablet Take 4 mg by mouth at bedtime.   Yes [provider]  rOPINIRole (REQUIP) 4 MG tablet Take 4 mg by mouth at bedtime.   Yes [provider]  rosuvastatin (CRESTOR) 40 MG tablet Take 1 tablet (40 mg total) by mouth daily. 03/21/21  Yes Mercy Riding, MD  vitamin B-12 (CYANOCOBALAMIN) 500 MCG tablet Take 500 mcg by mouth daily.   Yes [provider]  docusate sodium (COLACE) 100 MG capsule Take 1 capsule (100 mg total) by mouth 2 (two) times daily. 04/26/21   Viona Gilmore D, NP  methocarbamol (ROBAXIN) 500 MG tablet Take 1 tablet (500 mg total) by mouth every 6 (six) hours as needed for muscle  spasms. 04/26/21   Viona Gilmore D, NP  metoprolol succinate (TOPROL-XL) 25 MG 24 hr tablet TAKE 1 TABLET(25 MG) BY MOUTH DAILY Patient taking differently: 25 mg daily. 09/03/20   Patwardhan, [name], MD  nitroGLYCERIN (NITROSTAT) 0.4 MG SL tablet Place 1 tablet (0.4 mg total) under the tongue every 5 (five) minutes as needed for chest pain. 09/02/20 04/22/21  Patwardhan, [name], MD      Allergies    Atorvastatin, Levaquin [levofloxacin in d5w], Niaspan [niacin er], and Tramadol    Review of Systems   Review of Systems  Constitutional:  Negative for decreased appetite and  fever.  Cardiovascular:  Negative for palpitations.  Gastrointestinal:  Negative for abdominal pain, nausea and vomiting.  Genitourinary:  Negative for bladder incontinence.  Skin:  Negative for rash.  Neurological:  Positive for weakness. Negative for seizures, light-headedness and headaches.  Psychiatric/Behavioral:  Negative for agitation and hallucinations.    Physical Exam Updated Vital Signs BP (!) 151/74    Pulse 79    Temp 98.4 F (36.9 C) (Rectal)    Resp 17    Ht 6\' 2"  (1.88 m)    Wt 81.2 kg    SpO2 100%    BMI 22.98 kg/m  Physical Exam Vitals and nursing note reviewed.  Constitutional:      General: He is not in acute distress.    Appearance: He is well-developed.  HENT:     Head: Normocephalic and atraumatic.     Nose: Nose normal.     Mouth/Throat:     Mouth: Mucous membranes are moist.  Eyes:     Extraocular Movements: Extraocular movements intact.     Conjunctiva/sclera: Conjunctivae normal.     Comments: Right pupil is mildly enlarged compared to the left pupil but both are reactive to light, extraocular movements are intact  Cardiovascular:     Rate and Rhythm: Normal rate and regular rhythm.     Pulses: Normal pulses.     Heart sounds: Normal heart sounds. No murmur heard. Pulmonary:     Effort: Pulmonary effort is normal. No respiratory distress.     Breath sounds: Normal breath sounds.  Abdominal:     Palpations: Abdomen is soft.     Tenderness: There is no abdominal tenderness.  Musculoskeletal:        General: No swelling. Normal range of motion.     Cervical back: Neck supple.  Skin:    General: Skin is warm and dry.     Capillary Refill: Capillary refill takes less than 2 seconds.     Comments: Surgical site in the low back appears clean, dry, intact  Neurological:     General: No focal deficit present.     Mental Status: He is alert and oriented to person, place, and time.     Cranial Nerves: No cranial nerve deficit.     Sensory: No sensory  deficit.     Motor: No weakness.     Coordination: Coordination normal.     Comments: 5+ out of 5 strength, normal sensation, no drift, normal visual fields  Psychiatric:        Mood and Affect: Mood normal.    ED Results / Procedures / Treatments   Labs (all labs ordered are listed, but only abnormal results are displayed) Labs Reviewed  CBC WITH DIFFERENTIAL/PLATELET - Abnormal; Notable for the following components:      Result Value   RBC 2.53 (*)    Hemoglobin 7.8 (*)  HCT 23.9 (*)    Eosinophils Absolute 0.7 (*)    All other components within normal limits  COMPREHENSIVE METABOLIC PANEL - Abnormal; Notable for the following components:   Sodium 133 (*)    Chloride 96 (*)    Glucose, Bld 238 (*)    Calcium 8.8 (*)    Albumin 3.4 (*)    All other components within normal limits  URINALYSIS, ROUTINE W REFLEX MICROSCOPIC - Abnormal; Notable for the following components:   Glucose, UA >1,000 (*)    Protein, ur TRACE (*)    Bacteria, UA FEW (*)    All other components within normal limits  OCCULT BLOOD X 1 CARD TO LAB, STOOL    EKG EKG Interpretation  Date/Time:  Monday April 28 2021 17:05:56 EST Ventricular Rate:  82 PR Interval:  168 QRS Duration: 102 QT Interval:  404 QTC Calculation: 472 R Axis:   76 Text Interpretation: Sinus rhythm Confirmed by Lennice Sites ([id number]) on 04/28/2021 5:08:27 PM  Radiology CT Head Wo Contrast  Result Date: 04/28/2021 CLINICAL DATA:  Fall, head trauma, confusion EXAM: CT HEAD WITHOUT CONTRAST TECHNIQUE: Contiguous axial images were obtained from the base of the skull through the vertex without intravenous contrast. RADIATION DOSE REDUCTION: This exam was performed according to the departmental dose-optimization program which includes automated exposure control, adjustment of the mA and/or kV according to patient size and/or use of iterative reconstruction technique. COMPARISON:  09/12/2020 FINDINGS: Brain: Stable atrophy pattern and  mild white matter microvascular ischemic changes throughout both cerebral hemispheres. No acute intracranial hemorrhage, new mass lesion, new infarction, midline shift, herniation, hydrocephalus, or extra-axial fluid collection. No focal mass effect or edema. Cisterns are patent. No cerebellar abnormality. Vascular: Intracranial atherosclerosis at the skull base. No hyperdense vessel. Skull: Normal. Negative for fracture or focal lesion. Sinuses/Orbits: No acute finding. Other: None. IMPRESSION: Stable atrophy and white matter microvascular ischemic changes. No acute intracranial abnormality or interval change by noncontrast CT. Electronically Signed   By: Jerilynn Mages.  Shick M.D.   On: 04/28/2021 18:11   DG Chest Portable 1 View  Result Date: 04/28/2021 CLINICAL DATA:  Fall EXAM: PORTABLE CHEST 1 VIEW COMPARISON:  Chest radiograph 03/12/2021 FINDINGS: The cardiomediastinal silhouette is stable. There is no focal consolidation or pulmonary edema. There is no pleural effusion or pneumothorax. There is no acute osseous abnormality. Lumbar spine fusion hardware is partially imaged. Cholecystectomy clips are noted. IMPRESSION: No radiographic evidence of acute cardiopulmonary process. Electronically Signed   By: Valetta Mole M.D.   On: 04/28/2021 17:54    Procedures Procedures    Medications Ordered in ED Medications  sodium chloride 0.9 % bolus 1,000 mL (0 mLs Intravenous Stopped 04/28/21 1947)    ED Course/ Medical Decision Making/ A&P                           Medical Decision Making Amount and/or Complexity of Data Reviewed Labs: ordered. Radiology: ordered.   Harry Zamora is an 81 year old male with history of high cholesterol, depression, low back pain status post back surgery this past week who presents to the ED with generalized weakness, confusion, falls.  Patient arrives with normal vitals.  No fever.  Had 2 falls which sound mechanical in the last 2 days.  Thinks he might of hit his head.   Family concerned about his strength and dehydration concerns.  They state he is confused but he appears alert and oriented.  Neurologically he  appears intact.  He seems very appropriate.  He denies any infectious symptoms.  He has not had bowel movements but he is passing gas.  Denies any abdominal pain.  His exam is overall unremarkable.  His right pupil is mildly enlarged compared to his left but is reactive to light.  He has had cataract surgeries bilaterally.  Suspect that this is likely surgical pupil.  However will get head CT, CBC, CMP, urine study, chest x-ray.  Will give IV fluid bolus.  Differential diagnosis includes trauma from the fall including head bleed.  Will check labs to evaluate for electrolyte abnormality, dehydration, urinary tract infection, pneumonia.  EKG per my review shows sinus rhythm.  No ischemic changes.  Lab work was reviewed and interpreted.  Urinalysis negative for infection.  Hemoccult was negative.  He did have a blood count of 7.9.  Preop hemoglobin was 12.  Postop hemoglobin was also 7.8.  Do not think that there is GI bleed.  Hemoglobin dropped likely secondary to surgery.  Lab work is otherwise unremarkable.  CT scan per my review and evaluation shows no acute head injury or head bleed.  Radiology report also with no acute findings.  Chest x-ray per my interpretation shows no pneumonia or pneumothorax.  Overall suspect that may be blood count being lower than normal is causing some of his generalized weakness.  He overall seems well.  Shared decision was made for continued outpatient management with rest and hydration.  Suspect that as time passes his hemoglobin will improve and his symptoms will improve.  They understand to return if he develops any chest pain, shortness of breath, syncopal episodes.  Discharged in good condition.  This chart was dictated using voice recognition software.  Despite best efforts to proofread,  errors can occur which can change the  documentation meaning.         Final Clinical Impression(s) / ED Diagnoses Final diagnoses:  Weakness    Rx / DC Orders ED Discharge Orders     None         Lennice Sites, DO 04/28/21 2032

## 2021-05-06 ENCOUNTER — Other Ambulatory Visit: Payer: Self-pay | Admitting: Gastroenterology

## 2021-05-06 ENCOUNTER — Ambulatory Visit
Admission: RE | Admit: 2021-05-06 | Discharge: 2021-05-06 | Disposition: A | Discharge status: Home / Self Care | Payer: Medicare PPO | Source: Ambulatory Visit | Attending: Gastroenterology | Admitting: Gastroenterology

## 2021-05-06 ENCOUNTER — Other Ambulatory Visit: Payer: Self-pay

## 2021-05-06 DIAGNOSIS — K5900 Constipation, unspecified: Secondary | ICD-10-CM

## 2021-05-06 IMAGING — DX DG ABDOMEN 2V
3 series · 3 of 3 positions shown · non-contrast
Comparison: CT of the abdomen and pelvis [DATE]

CLINICAL DATA: Constipation.

EXAM:
ABDOMEN - 2 VIEW

[dg abd 2 views (1 of 3)]
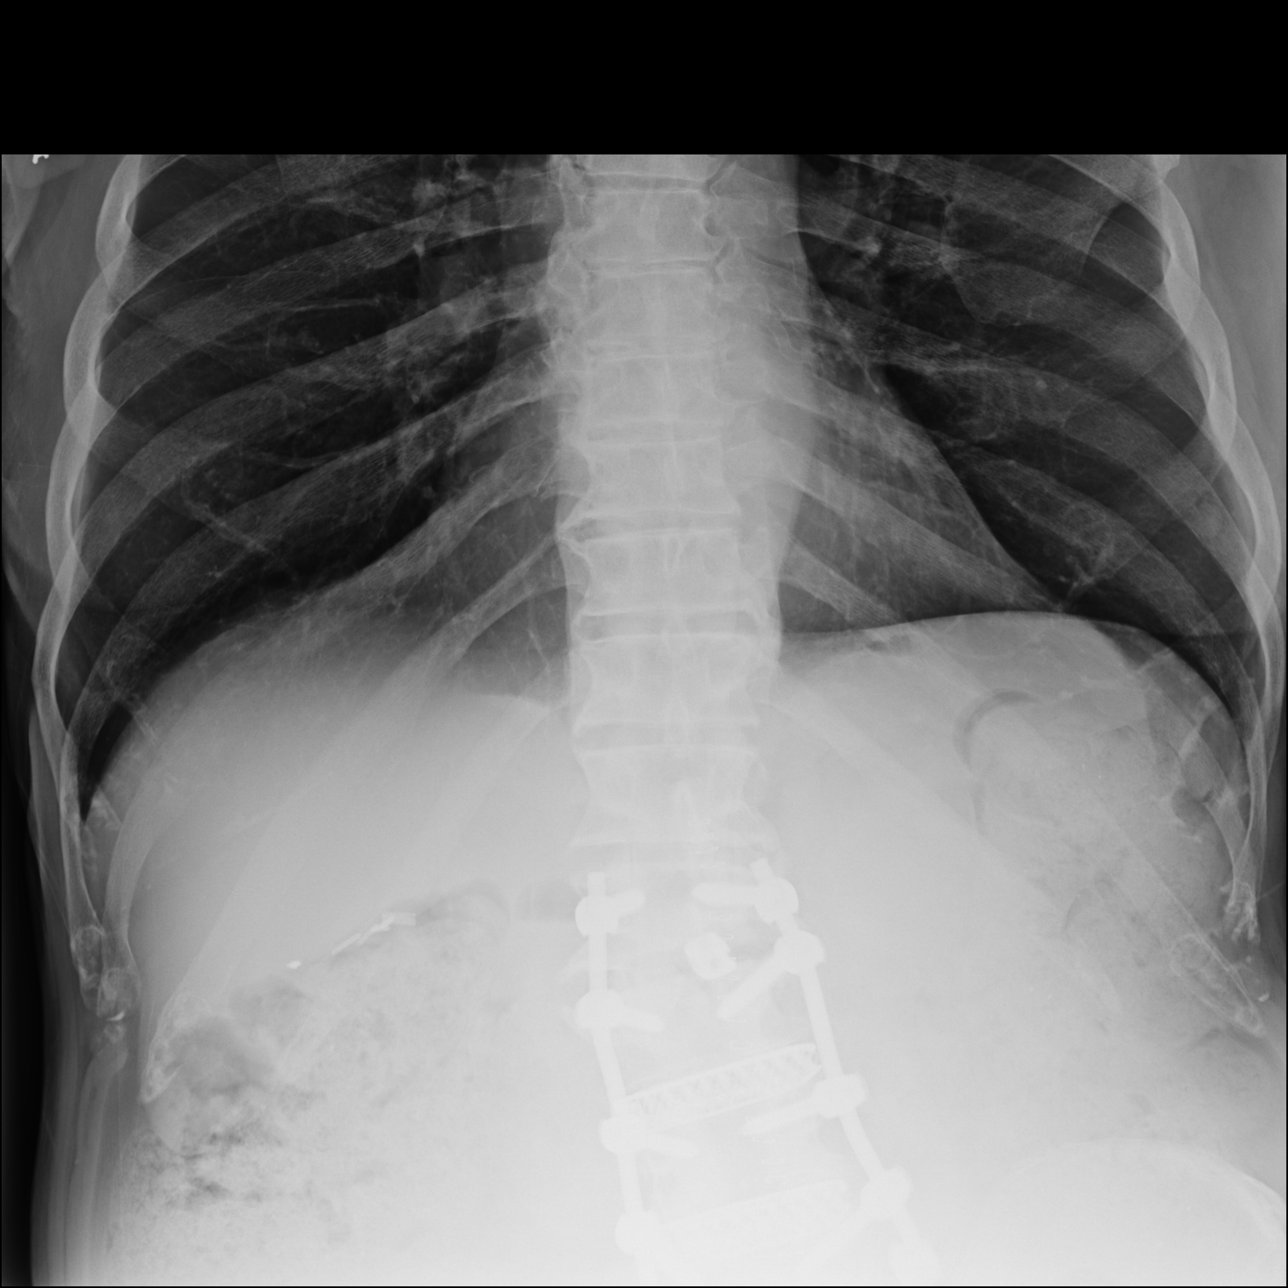

[dg abd 2 views (2 of 3)]
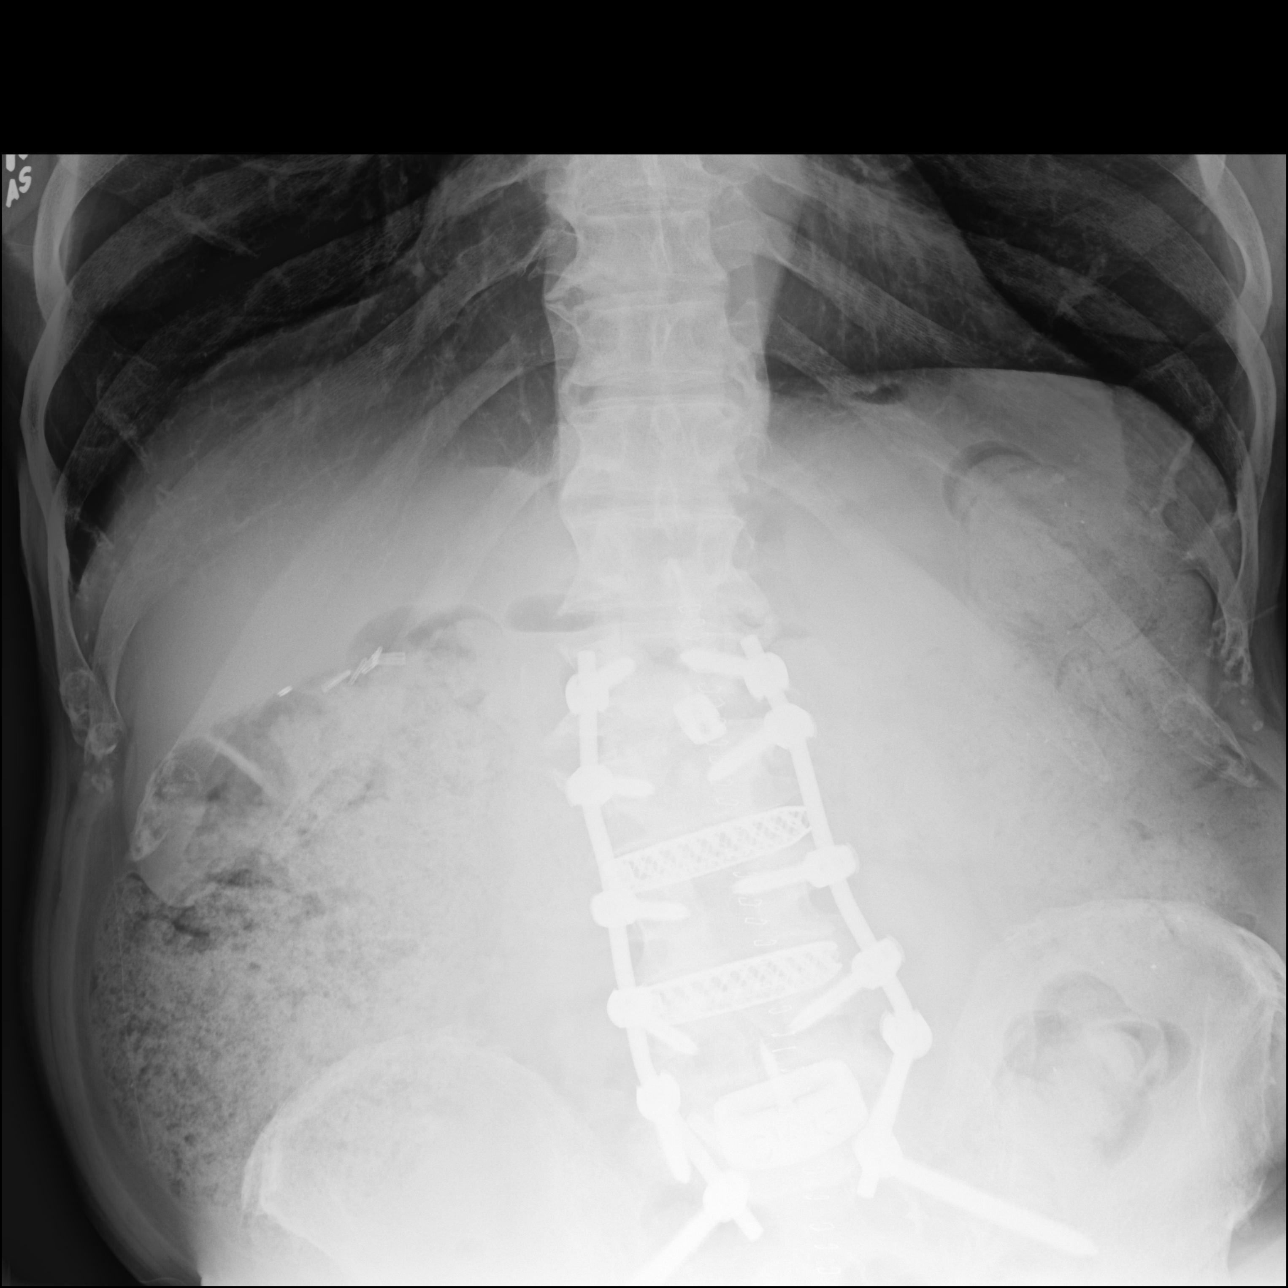

[dg abd 2 views (3 of 3)]
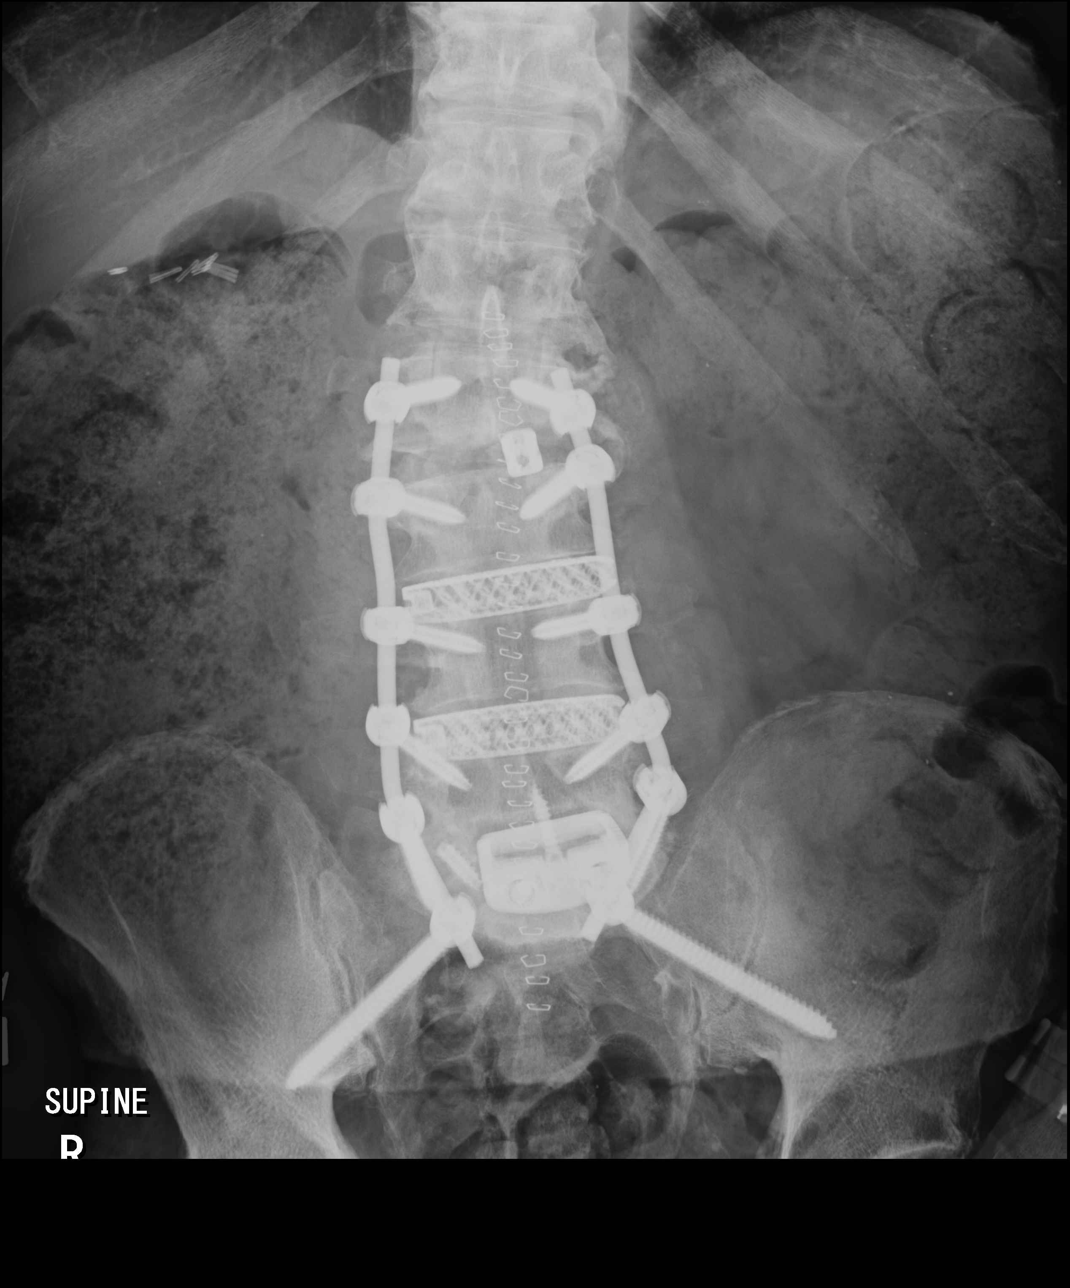

[3 of 3 positions shown; findings below may reference images not displayed]

FINDINGS: Moderate stool is present in the ascending, transverse, and proximal
descending colon. Gas is present in the sigmoid colon. The small
bowel is unremarkable.

Spinal fusion noted. Lung bases are clear. Atherosclerotic changes
are present at the aortic arch. Surgical clips are present at the
gallbladder fossa.
IMPRESSION: Moderate stool in the ascending, transverse, and proximal descending
colon without obstruction.

## 2021-06-06 ENCOUNTER — Other Ambulatory Visit (HOSPITAL_COMMUNITY): Payer: Self-pay

## 2021-06-10 ENCOUNTER — Other Ambulatory Visit (HOSPITAL_COMMUNITY): Payer: Self-pay

## 2021-07-03 ENCOUNTER — Other Ambulatory Visit (HOSPITAL_COMMUNITY): Payer: Self-pay

## 2021-07-03 MED ORDER — TRULICITY 3 MG/0.5ML ~~LOC~~ SOAJ
SUBCUTANEOUS | 3 refills | Status: DC
  Filled 2021-07-03: qty 6, 84d supply, fill #0

## 2021-07-04 ENCOUNTER — Other Ambulatory Visit (HOSPITAL_COMMUNITY): Payer: Self-pay

## 2021-07-20 NOTE — Progress Notes (Signed)
? ? ?Patient referred by Harry Bowen, MD for syncope ? ?Subjective:  ? ?Harry Zamora, male    DOB: 05-13-1940, 81 y.o.   MRN: [id number] ? ? ?Chief Complaint  ?Patient presents with  ? Coronary Artery Disease  ?  3 month  ? ? ? ?HPI ? ?81 y.o. Caucasian male with uncontrolled type 2 diabetes mellitus, CAD, PAD, syncope, spinal stenosis s/p prior surgeries ? ?Patient presents for 48-monthfollow-up. At Last office visit patient was preparing for spinal surgery which was done 04/2021.  Unfortunately repeat lipid profile testing has not been done as previously ordered.  At last office visit patient was stable from a cardiovascular standpoint and no changes were made.  Following discharge from the hospital patient has been slowly recovering.  However upon medication reconciliation he is currently taking Minipress and not metoprolol.  Patient's blood pressure is soft and he complains of occasional dizziness and lightheadedness as well as general fatigue.  Suspect the symptoms are multifactorial including deconditioning. ? ?Denies chest pain, significant dyspnea.  Denies syncope. ? 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  At baseline, he does not have any symptoms of chest pain, shortness of breath, claudication with exertion.  He reports occasional episodes of shortness of breath just before going to bed.  He denies any orthopnea, PND, leg edema symptoms.  Patient with had an episode of syncope about 2 weeks ago.  He woke up at 6 AM, as usual, had his coffee.  He had not had his breakfast yet.  He was sitting at dining table.  He does not recollect having any chest pain, shortness of breath, lightheadedness symptoms.  Next thing he remembers that he was down on the floor with his wife standing next to him.  His wife called his nephew, who assisted him to sit up.  The entire episode  lasted for less than a minute.  On waking up, patient did not have any confusion, loss of bowel or bladder tone, tongue bite, seizures.  Patient did not have any other symptoms rest of the day.  He has not had any recurrence of similar episodes since then. ? ?On a separate note, patient tells me that he has had spinal surgeries in the past, and was going to undergo surgery for removal of the rods today.  However, surgery was postponed given his syncope episode, as well as uncontrolled diabetes with A1c of 9.8%. ? ?Patient denies any claudication symptoms.  However, he has noted an area on his second toe left foot.  This started as a area of redness which he reportedly tried to pick at.  Now the area is black and discolored.  He denies any pain at the site. ? ? ?Current Outpatient Medications on File Prior to Visit  ?Medication Sig Dispense Refill  ? buPROPion (WELLBUTRIN SR) 150 MG 12 hr tablet Take 150 mg by mouth daily.    ? Calcium Carb-Cholecalciferol (CALCIUM 1000 + D PO) Take by mouth.    ? clopidogrel (PLAVIX) 75 MG tablet Take 1 tablet (75 mg total) by mouth daily. **Restart Plavix on 04/29/2021** 90 tablet 3  ? docusate sodium (COLACE) 100 MG capsule Take 1 capsule (100 mg total) by mouth 2 (two) times daily. 10 capsule 0  ? Dulaglutide (TRULICITY) 3 MTW/6.5KCSOPN Inject 3 mg into the skin once a week. 6 mL  3  ? DULoxetine (CYMBALTA) 30 MG capsule Take 30 mg by mouth daily.    ? FARXIGA 10 MG TABS tablet Take 10 mg by mouth daily.     ? Ferrous Sulfate Dried (HIGH POTENCY IRON) 65 MG TABS Take by mouth.    ? gabapentin (NEURONTIN) 600 MG tablet Take 600 mg by mouth 2 (two) times daily.    ? lovastatin (MEVACOR) 40 MG tablet Take 40 mg by mouth daily.    ? meloxicam (MOBIC) 15 MG tablet Take 15 mg by mouth daily.    ? metFORMIN (GLUCOPHAGE) 500 MG tablet Take 2 tablets (1,000 mg total) by mouth 2 (two) times daily with a meal. Resume 10/03/2020  1  ? methocarbamol (ROBAXIN) 500 MG tablet Take 1 tablet (500 mg  total) by mouth every 6 (six) hours as needed for muscle spasms. 20 tablet 1  ? MULTIPLE MINERALS PO Take by mouth.    ? nitroGLYCERIN (NITROSTAT) 0.4 MG SL tablet Place 1 tablet (0.4 mg total) under the tongue every 5 (five) minutes as needed for chest pain. 30 tablet 3  ? pregabalin (LYRICA) 75 MG capsule Take 75 mg by mouth 2 (two) times daily.    ? rOPINIRole (REQUIP) 4 MG tablet Take 4 mg by mouth at bedtime.    ? rosuvastatin (CRESTOR) 40 MG tablet Take 1 tablet (40 mg total) by mouth daily. 90 tablet 3  ? vitamin B-12 (CYANOCOBALAMIN) 500 MCG tablet Take 500 mcg by mouth daily.    ? rOPINIRole (REQUIP) 4 MG tablet Take 4 mg by mouth at bedtime.    ? ?No current facility-administered medications on file prior to visit.  ? ? ?Cardiovascular and other pertinent studies: ?EKG 07/21/2021:  ?Sinus rhythm with occasional PVC at a rate of 92 bpm.  Normal axis.  Poor progression, cannot exclude anteroseptal infarct old. ? ?EKG 12/13/2020: ?Sinus rhythm 90 bpm with brief ventricular bigeminy ?Old anteroseptal infarct ? ?Carotid duplex 12/11/2020: ?Right Carotid: Velocities in the right ICA are consistent with a 1-39%  ?stenosis.  ?Left Carotid: Velocities in the left ICA are consistent with a 1-39%  ?stenosis.  ?Vertebrals:  Bilateral vertebral arteries demonstrate antegrade flow.  ?Subclavians: Normal flow hemodynamics were seen in bilateral subclavian  ?             arteries.  ? ?EKG 10/03/2020: ?Sinus rhythm 73 bpm ?Old anteroseptal infarct ?Nonspecific T-abnormality  ? ?Coronary intervention 10/01/2020: ?Successful percutaneous coronary intervention LAD and ramus with simultaneous T-stenting ?       PTCA and stent placement 3.0 X 38 mm Onyx Frontier drug-eluting stent Ostial-mid LAD ?       PTCA and stent placement 3.0 X 26 mm Resolute Onyx drug-eluting stent ?       Final kissing balloon angioplasty with 3.5X15 mm [place] balloon in LAD & 2.75X15 mm [place] balloon in ramus ?  ?LAD stenosis 90%-->0% ?Ramus stenosis 80%-->0% ?TIMI  flow III-->III ? ?Coronary angiography 09/17/2020: ?LM: Normal ?LAD: Long ostial to mid LAD 90% stenosis ?       Small high diag 1 patent ?       Subtotally occluded diag 2, collaterals from OM ?Ramus: Medium sized vessel with mid 70% stenosis ?LCx: Large,dominant. Normal ?RCA: Small, non-dominant. Prox 60% stenosis ?  ?LVEDP normal ?  ?Obstructive CAD could possible be a cause of his syncope, although this is only speculative. He feels tired after walking for 2 miles, although denies any specific angina symptoms.  Based on his  coronary angiogram findings, perioperative cardiac risk is elevated. Will discuss with Dr. Erline Levine re: urgency and nature of his upcoming spine surgery and then discuss further recommendations re: CAD ? ?Mobile cardiac telemetry 11 days 08/28/2020 - 09/09/2020: ?Dominant rhythm: Sinus. ?HR 57-123 bpm. Avg HR 78 bpm, while in sinus rhythm. ?17 episodes of SVT, fastest at 207 bpm for 13 beats, longest for 20 beats at 140 bpm. ?<1% isolated SVE, couplet/triplets. ?1 episode of VT, at 162 bpm for 5 beats ?2.2% isolated VE, <1% couplet ?No atrial fibrillation/atrial flutter/high grade AV block, sinus pause >3sec noted. ?5 patient triggered events, all correlated with sinus rhythm ? ?Exercise Tetrofosmin stress test 08/28/2020: ?Exercise nuclear stress test was performed using Bruce protocol. Patient reached 4.6 METS, and 93% of age predicted maximum heart rate. Exercise capacity was low. No chest pain reported. Heart rate and hemodynamic response were normal. Stress EKG showed sinus tachycardia, old anteroseptal infarct, no significant ST-T changes. ?SPECT images show small sized, severe intensity, reversible perfusion defect in apical anterior myocardium, with associated mild hypokinesis. Stress LVEF 47%. ?High risk study. ? ?Echocardiogram 08/28/2020:    ?Mildly depressed LV systolic function with EF 50%. Left ventricle cavity  ?is normal in size. Mild concentric hypertrophy of the left  ventricle.  ?Normal global wall motion. Doppler evidence of grade I (impaired)  ?diastolic dysfunction, elevated LAP. Calculated EF 50%.  ?Left atrial cavity is mildly dilated by volume index.  ?The aortic ro

## 2021-07-21 ENCOUNTER — Ambulatory Visit: Payer: Medicare PPO | Admitting: Cardiology

## 2021-07-21 ENCOUNTER — Encounter: Payer: Self-pay | Admitting: Student

## 2021-07-21 ENCOUNTER — Ambulatory Visit: Payer: Medicare PPO | Admitting: Student

## 2021-07-21 VITALS — BP 101/58 | HR 101 | Temp 97.7°F | Resp 17 | Ht 74.0 in | Wt 159.4 lb

## 2021-07-21 DIAGNOSIS — I739 Peripheral vascular disease, unspecified: Secondary | ICD-10-CM

## 2021-07-21 DIAGNOSIS — I25118 Atherosclerotic heart disease of native coronary artery with other forms of angina pectoris: Secondary | ICD-10-CM

## 2021-07-21 DIAGNOSIS — R9439 Abnormal result of other cardiovascular function study: Secondary | ICD-10-CM

## 2021-07-21 MED ORDER — METOPROLOL SUCCINATE ER 25 MG PO TB24: ORAL_TABLET | ORAL | 3 refills | Status: DC

## 2021-07-24 DIAGNOSIS — M5416 Radiculopathy, lumbar region: Secondary | ICD-10-CM | POA: Diagnosis not present

## 2021-07-24 DIAGNOSIS — M5136 Other intervertebral disc degeneration, lumbar region: Secondary | ICD-10-CM | POA: Diagnosis not present

## 2021-07-24 DIAGNOSIS — M6281 Muscle weakness (generalized): Secondary | ICD-10-CM | POA: Diagnosis not present

## 2021-07-31 DIAGNOSIS — M5136 Other intervertebral disc degeneration, lumbar region: Secondary | ICD-10-CM | POA: Diagnosis not present

## 2021-07-31 DIAGNOSIS — M6281 Muscle weakness (generalized): Secondary | ICD-10-CM | POA: Diagnosis not present

## 2021-07-31 DIAGNOSIS — M5416 Radiculopathy, lumbar region: Secondary | ICD-10-CM | POA: Diagnosis not present

## 2021-07-31 NOTE — Progress Notes (Signed)
Patient referred by [name], MD for syncope  Subjective:   Harry Zamora, male    DOB: 29-Sep-1940, 81 y.o.   MRN: [id number]   Chief Complaint  Patient presents with   Coronary Artery Disease   Follow-up    2 weeks.     HPI  81 y.o. Caucasian male with uncontrolled type 2 diabetes mellitus, CAD, PAD, syncope, spinal stenosis s/p prior surgeries  Patient has continued to have episodes of lightheadedness in spite of stopping Minipress. H had a brief fall on his buttocks while standing in the lobby, did not hit his head.   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  He is very active, walks for 30 minutes most days, and works in the yard regularly.  At baseline, he does not have any symptoms of chest pain, shortness of breath, claudication with exertion.  He reports occasional episodes of shortness of breath just before going to bed.  He denies any orthopnea, PND, leg edema symptoms.  Patient with had an episode of syncope about 2 weeks ago.  He woke up at 6 AM, as usual, had his coffee.  He had not had his breakfast yet.  He was sitting at dining table.  He does not recollect having any chest pain, shortness of breath, lightheadedness symptoms.  Next thing he remembers that he was down on the floor with his wife standing next to him.  His wife called his nephew, who assisted him to sit up.  The entire episode lasted for less than a minute.  On waking up, patient did not have any confusion, loss of bowel or bladder tone, tongue bite, seizures.  Patient did not have any other symptoms rest of the day.  He has not had any recurrence of similar episodes since then.  On a separate note, patient tells me that he has had spinal surgeries in the past, and was going to undergo surgery for removal of the rods today.  However, surgery was postponed given his syncope episode, as well as uncontrolled diabetes with A1c of 9.8%.  Patient denies  any claudication symptoms.  However, he has noted an area on his second toe left foot.  This started as a area of redness which he reportedly tried to pick at.  Now the area is black and discolored.  He denies any pain at the site.  Current Outpatient Medications:    buPROPion (WELLBUTRIN SR) 150 MG 12 hr tablet, Take 150 mg by mouth daily., Disp: , Rfl:    Calcium Carb-Cholecalciferol (CALCIUM 1000 + D PO), Take by mouth., Disp: , Rfl:    clopidogrel (PLAVIX) 75 MG tablet, Take 1 tablet (75 mg total) by mouth daily. **Restart Plavix on 04/29/2021**, Disp: 90 tablet, Rfl: 3   Dulaglutide (TRULICITY) 3 JJ/9.4RD SOPN, Inject 3 mg into the skin once a week., Disp: 6 mL, Rfl: 3   DULoxetine (CYMBALTA) 30 MG capsule, Take 30 mg by mouth daily., Disp: , Rfl:    FARXIGA 10 MG TABS tablet, Take 10 mg by mouth daily. , Disp: , Rfl:    Ferrous Sulfate Dried (HIGH POTENCY IRON) 65 MG TABS, Take by mouth., Disp: , Rfl:    lovastatin (MEVACOR) 40 MG tablet, Take 40 mg by mouth daily., Disp: , Rfl:    meloxicam (MOBIC) 15 MG tablet, Take 15 mg by mouth daily., Disp: , Rfl:    metFORMIN (GLUCOPHAGE) 500 MG tablet, Take 2 tablets (1,000  mg total) by mouth 2 (two) times daily with a meal. Resume 10/03/2020, Disp: , Rfl: 1   MULTIPLE MINERALS PO, Take by mouth., Disp: , Rfl:    nitroGLYCERIN (NITROSTAT) 0.4 MG SL tablet, Place 1 tablet (0.4 mg total) under the tongue every 5 (five) minutes as needed for chest pain., Disp: 30 tablet, Rfl: 3   rOPINIRole (REQUIP) 4 MG tablet, Take 4 mg by mouth at bedtime., Disp: , Rfl:    rOPINIRole (REQUIP) 4 MG tablet, Take 4 mg by mouth at bedtime., Disp: , Rfl:    rosuvastatin (CRESTOR) 40 MG tablet, Take 1 tablet (40 mg total) by mouth daily., Disp: 90 tablet, Rfl: 3   vitamin B-12 (CYANOCOBALAMIN) 500 MCG tablet, Take 500 mcg by mouth daily., Disp: , Rfl:   Cardiovascular and other pertinent studies:  EKG 08/04/2021: Sinus rhythm 92 bpm Occasional PVC Old anteroseptal  infarct  Carotid duplex 12/11/2020: Right Carotid: Velocities in the right ICA are consistent with a 1-39%  stenosis.  Left Carotid: Velocities in the left ICA are consistent with a 1-39%  stenosis.  Vertebrals:  Bilateral vertebral arteries demonstrate antegrade flow.  Subclavians: Normal flow hemodynamics were seen in bilateral subclavian               arteries.   Coronary intervention 10/01/2020: Successful percutaneous coronary intervention LAD and ramus with simultaneous T-stenting        PTCA and stent placement 3.0 X 38 mm Onyx Frontier drug-eluting stent Ostial-mid LAD        PTCA and stent placement 3.0 X 26 mm Resolute Onyx drug-eluting stent        Final kissing balloon angioplasty with 3.5X15 mm [place] balloon in LAD & 2.75X15 mm [place]evier balloon in ramus   LAD stenosis 90%-->0% Ramus stenosis 80%-->0% TIMI flow III-->III  Coronary angiography 09/17/2020: LM: Normal LAD: Long ostial to mid LAD 90% stenosis        Small high diag 1 patent        Subtotally occluded diag 2, collaterals from OM Ramus: Medium sized vessel with mid 70% stenosis LCx: Large,dominant. Normal RCA: Small, non-dominant. Prox 60% stenosis   LVEDP normal   Obstructive CAD could possible be a cause of his syncope, although this is only speculative. He feels tired after walking for 2 miles, although denies any specific angina symptoms.  Based on his coronary angiogram findings, perioperative cardiac risk is elevated. Will discuss with Dr. Erline Levine re: urgency and nature of his upcoming spine surgery and then discuss further recommendations re: CAD  Mobile cardiac telemetry 11 days 08/28/2020 - 09/09/2020: Dominant rhythm: Sinus. HR 57-123 bpm. Avg HR 78 bpm, while in sinus rhythm. 17 episodes of SVT, fastest at 207 bpm for 13 beats, longest for 20 beats at 140 bpm. <1% isolated SVE, couplet/triplets. 1 episode of VT, at 162 bpm for 5 beats 2.2% isolated VE, <1% couplet No atrial fibrillation/atrial  flutter/high grade AV block, sinus pause >3sec noted. 5 patient triggered events, all correlated with sinus rhythm  Exercise Tetrofosmin stress test 08/28/2020: Exercise nuclear stress test was performed using Bruce protocol. Patient reached 4.6 METS, and 93% of age predicted maximum heart rate. Exercise capacity was low. No chest pain reported. Heart rate and hemodynamic response were normal. Stress EKG showed sinus tachycardia, old anteroseptal infarct, no significant ST-T changes. SPECT images show small sized, severe intensity, reversible perfusion defect in apical anterior myocardium, with associated mild hypokinesis. Stress LVEF 47%. High risk study.  Echocardiogram 08/28/2020:  Mildly depressed LV systolic function with EF 50%. Left ventricle cavity  is normal in size. Mild concentric hypertrophy of the left ventricle.  Normal global wall motion. Doppler evidence of grade I (impaired)  diastolic dysfunction, elevated LAP. Calculated EF 50%.  Left atrial cavity is mildly dilated by volume index.  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  Lower Extremity Arterial Duplex 08/28/2020:  No hemodynamically significant stenoses are identified in the right or the  left lower extremity arterial system.  There is monophasic waveform in the bilateral AT and PT vessels and left  PT has spectral broadening suggestive of severe diffuse disease.  This exam reveals moderately decreased perfusion of the right lower  extremity, noted at the anterior tibial artery level (ABI 0.73) and  moderately decreased perfusion of the left lower extremity, noted at the  post tibial artery level (ABI 0.73).   CT lumbar spine 06/26/2020: 1. Prior L3-S1 PLIF with fracture of the left S1 anterior screw and loosening of the right S1 anterior and posterior screws. The right posterior screw traverses the right L5-S1 facet joint. No significant interbody fusion at any level. 2. Residual moderate right neuroforaminal  stenosis at L5-S1. Asymmetric underfilling of the right S1 nerve root with suspected scar tissue surrounding the nerve root in the lateral recess 3. Progressive adjacent segment disease at L2-L3 with new moderate spinal canal and right neuroforaminal stenosis. 4. Nonobstructive right nephrolithiasis. 5. Aortic Atherosclerosis (ICD10-I70.0).     ABI 08/30/2019: Right: Velocities suggest 50-74% stenosis in the proximal PTA and 30-49%  stenosis in the proximal Peroneal a. Tibial vessel disease.  Left: No obvious evidence of stenosis. Tibial vessel disease.    Recent labs: 10/03/2020: Glucose 206, BUN/Cr 27/1.38. EGFR 51. Na/K 141/5.8.  Chol 137, TG 121, HDL 33, LDL 82  10/01/2020: Glucose 169, BUN/Cr 31/1.38. EGFR 51. Na/K 137/5.8.  H/H 12/38. MCV 97. Platelets 234  08/05/2020: Glucose 232, BUN/Cr 28/1.26. EGFR 58. Na/K 137/4.4.  H/H 13/42. MCV 96. Platelets 206 HbA1C 9.5% Lipid panel N/A TSH N/A  09/20/2019: Chol 123, TG 183, HDL 32, LDL 54   Review of Systems  Cardiovascular:  Negative for chest pain, dyspnea on exertion, leg swelling, palpitations and syncope.  Neurological:  Positive for light-headedness.       Orthostatic VS for the past 72 hrs (Last 3 readings):  Patient Position BP Location Cuff Size  08/04/21 1035 Sitting Left Arm Large  08/04/21 1029 Sitting Left Arm Large     Vitals:   08/04/21 1029 08/04/21 1035  BP: (!) 86/45 (!) 110/59  Pulse: 69 70  Resp: 16   Temp: 97.6 F (36.4 C)   SpO2:  99%     Body mass index is 20.29 kg/m. Filed Weights   08/04/21 1029  Weight: 158 lb (71.7 kg)    Orthostatic VS for the past 72 hrs (Last 3 readings):  Patient Position BP Location Cuff Size  08/04/21 1035 Sitting Left Arm Large  08/04/21 1029 Sitting Left Arm Large      Objective:   Physical Exam Vitals and nursing note reviewed.  Constitutional:      General: He is not in acute distress. Neck:     Vascular: No JVD.  Cardiovascular:      Rate and Rhythm: Normal rate and regular rhythm.     Pulses:          Femoral pulses are 2+ on the right side and 2+ on the left side.  Popliteal pulses are 2+ on the right side and 1+ on the left side.       Dorsalis pedis pulses are 2+ on the right side and 0 on the left side.       Posterior tibial pulses are 0 on the right side and 0 on the left side.     Heart sounds: Normal heart sounds. No murmur heard. Pulmonary:     Effort: Pulmonary effort is normal.     Breath sounds: Normal breath sounds. No wheezing or rales.  Musculoskeletal:     Right lower leg: No edema.     Left lower leg: No edema.        Assessment & Recommendations:   81 y.o. Caucasian male with uncontrolled type 2 diabetes mellitus, CAD, PAD, recurrent syncope, spinal stenosis s/p prior surgeries  Lightheadedness: I reckon this is due to hypovolemia, suspect due to [place].  Stop Farxiga, increase hydration. Avoid driving at this time.   CAD:  Obstructive CAD with high risk anatomy, stable angina Successfully treated with LAD/ramus T stenting (10/01/2020) No angina symptoms at this time. Continue plavix, metoprolol succinate 25 mg daily. Continue rosuvastatin 40 mg daily.  PAD: B/l ABI 0.73 (08/2020) No critical limb ischemia or lifestyle limiting claudication Previously had a focal eschar on left foot 2nd toe, which could have been an atheroembolic event. Likely source could be distal aortic arahc aulcer. Now completely healed. Continue medical management and regular walking Continue plavix. Currently not taking pletal, but does not have severe claudication symptoms,.  Distal aortic arch ulcer: Repeat CT in 6 months as per CVTS  F/u in 4 weeks    Nigel Mormon, MD Pager: [phone] Office: [phone]

## 2021-08-04 ENCOUNTER — Ambulatory Visit: Payer: Medicare PPO | Admitting: Student

## 2021-08-04 ENCOUNTER — Encounter: Payer: Self-pay | Admitting: Student

## 2021-08-04 VITALS — BP 110/59 | HR 70 | Temp 97.6°F | Resp 16 | Ht 74.0 in | Wt 158.0 lb

## 2021-08-04 DIAGNOSIS — R42 Dizziness and giddiness: Secondary | ICD-10-CM | POA: Insufficient documentation

## 2021-08-04 DIAGNOSIS — I739 Peripheral vascular disease, unspecified: Secondary | ICD-10-CM

## 2021-08-04 DIAGNOSIS — I25118 Atherosclerotic heart disease of native coronary artery with other forms of angina pectoris: Secondary | ICD-10-CM

## 2021-08-06 ENCOUNTER — Encounter: Payer: Self-pay | Admitting: Pulmonary Disease

## 2021-08-06 ENCOUNTER — Ambulatory Visit: Payer: Medicare PPO | Admitting: Pulmonary Disease

## 2021-08-06 VITALS — BP 112/56 | HR 52 | Temp 97.5°F | Ht 74.0 in | Wt 157.8 lb

## 2021-08-06 DIAGNOSIS — J849 Interstitial pulmonary disease, unspecified: Secondary | ICD-10-CM | POA: Diagnosis not present

## 2021-08-06 NOTE — Progress Notes (Signed)
Harry Zamora    [id number]    1940-05-27  Primary Care Physician:South, Annie Main, MD  Referring Physician: [name], [address] [place],  [place] [address]  Chief complaint: Consult for interstitial lung disease  HPI: 81 year old with history of hypertension, diabetes, hyperlipidemia, prostate cancer in 2003 and he was hospitalized in December 2022 with severe sepsis secondary to community acquired pneumonia and RSV infection. Treated with ceftriaxone, azithromycin. Follow-up CTA in January showed persistent bilateral consolidation. CT also revealed an ulcer in the aorta and he has been evaluated by cardiothoracic surgery and has no indication for surgery at this point.  Overall he is feeling well with no dyspnea or cough aspirin production.  Pets: Dogs Occupation: Used to be in [employer]. Later worked as a Research officer, trade union Exposures: No mold, hot tub, Customer service manager. No further pillars or comforters. Smoking history: smoke cigars occasionally. No cigarette use Travel history: originally from [place]. No recent travel Relevant family history: no family history of lung disease  Outpatient Encounter Medications as of 08/06/2021  Medication Sig   buPROPion (WELLBUTRIN SR) 150 MG 12 hr tablet Take 150 mg by mouth daily.   Calcium Carb-Cholecalciferol (CALCIUM 1000 + D PO) Take by mouth.   clopidogrel (PLAVIX) 75 MG tablet Take 1 tablet (75 mg total) by mouth daily. **Restart Plavix on 04/29/2021**   Dulaglutide (TRULICITY) 3 NO/6.7EH SOPN Inject 3 mg into the skin once a week.   DULoxetine (CYMBALTA) 30 MG capsule Take 30 mg by mouth daily.   Ferrous Sulfate Dried (HIGH POTENCY IRON) 65 MG TABS Take by mouth.   lovastatin (MEVACOR) 40 MG tablet Take 40 mg by mouth daily.   meloxicam (MOBIC) 15 MG tablet Take 15 mg by mouth daily.   metFORMIN (GLUCOPHAGE) 500 MG tablet Take 2 tablets (1,000 mg total) by mouth 2 (two) times daily with a meal. Resume 10/03/2020    MULTIPLE MINERALS PO Take by mouth.   rOPINIRole (REQUIP) 4 MG tablet Take 4 mg by mouth at bedtime.   rosuvastatin (CRESTOR) 40 MG tablet Take 1 tablet (40 mg total) by mouth daily.   vitamin B-12 (CYANOCOBALAMIN) 500 MCG tablet Take 500 mcg by mouth daily.   nitroGLYCERIN (NITROSTAT) 0.4 MG SL tablet Place 1 tablet (0.4 mg total) under the tongue every 5 (five) minutes as needed for chest pain.   [DISCONTINUED] rOPINIRole (REQUIP) 4 MG tablet Take 4 mg by mouth at bedtime.   No facility-administered encounter medications on file as of 08/06/2021.    Allergies as of 08/06/2021 - Review Complete 08/06/2021  Allergen Reaction Noted   Atorvastatin Other (See Comments) 03/19/2021   Levaquin [levofloxacin in d5w] Rash 11/09/2013   Niaspan [niacin er] Rash and Other (See Comments) 11/09/2013   Tramadol Rash 11/09/2013    Past Medical History:  Diagnosis Date   Anemia    as an infant   Arthritis    Back pain    Depression    Disc displacement, lumbar    Gynecomastia    History of kidney stones    Hyperlipidemia    Hypertension    no longer on medications   Hypertriglyceridemia    Insomnia    Low back pain    Lumbar radiculopathy    Lumbar stenosis    OA (osteoarthritis)    Polyneuropathy in diabetes(357.2)    Prostate cancer ([place]) 2003   Restless legs    Ringing in ears    RLS (  restless legs syndrome)    Type 2 diabetes mellitus ([place])     Past Surgical History:  Procedure Laterality Date   ABDOMINAL EXPOSURE N/A 07/13/2019   Procedure: ABDOMINAL EXPOSURE;  Surgeon: Serafina Mitchell, MD;  Location: [facility];  Service: Vascular;  Laterality: N/A;  anterior approach   ANTERIOR LAT LUMBAR FUSION Right 07/13/2019   Procedure: Right Lumbar 3-4 Lumbar 4-5 Anterolateral lumbar interbody fusion;  Surgeon: Erline Levine, MD;  Location: [facility];  Service: Neurosurgery;  Laterality: Right;   ANTERIOR LUMBAR FUSION N/A 07/13/2019   Procedure: Lumbar Five-Sacral One Anterior lumbar  interbody fusion;  Surgeon: Erline Levine, MD;  Location: [facility];  Service: Neurosurgery;  Laterality: N/A;  Anterior approach   APPLICATION OF INTRAOPERATIVE CT SCAN N/A 04/24/2021   Procedure: APPLICATION OF INTRAOPERATIVE CT SCAN;  Surgeon: Dawley, Theodoro Doing, DO;  Location: [facility];  Service: Neurosurgery;  Laterality: N/A;  3C/RM 21   CHOLECYSTECTOMY     COLONOSCOPY     CORONARY STENT INTERVENTION N/A 10/01/2020   Procedure: CORONARY STENT INTERVENTION;  Surgeon: Nigel Mormon, MD;  Location: [facility];  Service: Cardiovascular;  Laterality: N/A;   EYE SURGERY Bilateral    GALLBLADDER SURGERY     HEMORRHOID SURGERY  1983   LEFT HEART CATH AND CORONARY ANGIOGRAPHY N/A 09/17/2020   Procedure: LEFT HEART CATH AND CORONARY ANGIOGRAPHY;  Surgeon: Nigel Mormon, MD;  Location: [facility];  Service: Cardiovascular;  Laterality: N/A;   LUMBAR LAMINECTOMY/DECOMPRESSION MICRODISCECTOMY N/A 10/02/2016   Procedure: L3 to S1 Laminectomy;  Surgeon: Erline Levine, MD;  Location: [facility];  Service: Neurosurgery;  Laterality: N/A;  L3 to S1 Laminectomy   LUMBAR LAMINECTOMY/DECOMPRESSION MICRODISCECTOMY Bilateral 09/07/2019   Procedure: Bilateral Lumbar Five - Sacral One Foraminotomy;  Surgeon: Erline Levine, MD;  Location: [facility];  Service: Neurosurgery;  Laterality: Bilateral;  posterior   LUMBAR PERCUTANEOUS PEDICLE SCREW 3 LEVEL N/A 07/13/2019   Procedure: Percutaneous pedicle screw fixation from Lumbar 3 to Sacral 1;  Surgeon: Erline Levine, MD;  Location: [facility];  Service: Neurosurgery;  Laterality: N/A;   MENISCUS REPAIR Right    PROSTATE SURGERY     SHOULDER SURGERY Left 01/2011   rotator cuff    Family History  Problem Relation Age of Onset   Arthritis Mother    Hypertension Mother    Diabetes Mother    Heart attack Mother    Cancer Father     Social History   Socioeconomic History   Marital status: Married    Spouse name: Leda Gauze   Number of children: 3   Years of  education: 12   Highest education level: Not on file  Occupational History   Occupation: Banker: [employer]  Tobacco Use   Smoking status: Former    Types: Cigars    Quit date: 2022    Years since quitting: 1.3   Smokeless tobacco: Never   Tobacco comments:    occasional  Vaping Use   Vaping Use: Never used  Substance and Sexual Activity   Alcohol use: Not Currently    Comment: social-beer   Drug use: No   Sexual activity: Not on file  Other Topics Concern   Not on file  Social History Narrative   Patient is married Leda Gauze) and lives at home with his wife.   Patient has three children and two- step children.   Patient is working full-time.   Patient has a high school education.  Patient is right-handed.   Patient drinks two cups of coffee, 2-3 sodas and one cup of tea daily.   Social Determinants of Health   Financial Resource Strain: Not on file  Food Insecurity: Not on file  Transportation Needs: Not on file  Physical Activity: Not on file  Stress: Not on file  Social Connections: Not on file  Intimate Partner Violence: Not on file    Review of systems: Review of Systems  Constitutional: Negative for fever and chills.  HENT: Negative.   Eyes: Negative for blurred vision.  Respiratory: as per HPI  Cardiovascular: Negative for chest pain and palpitations.  Gastrointestinal: Negative for vomiting, diarrhea, blood per rectum. Genitourinary: Negative for dysuria, urgency, frequency and hematuria.  Musculoskeletal: Negative for myalgias, back pain and joint pain.  Skin: Negative for itching and rash.  Neurological: Negative for dizziness, tremors, focal weakness, seizures and loss of consciousness.  Endo/Heme/Allergies: Negative for environmental allergies.  Psychiatric/Behavioral: Negative for depression, suicidal ideas and hallucinations.  All other systems reviewed and are negative.  Physical Exam: Blood pressure (!) 112/56, pulse (!) 52,  temperature (!) 97.5 F (36.4 C), temperature source Oral, height '6\' 2"'$  (1.88 m), weight 157 lb 12.8 oz (71.6 kg), SpO2 97 %. Gen:      No acute distress HEENT:  EOMI, sclera anicteric Neck:     No masses; no thyromegaly Lungs:    Clear to auscultation bilaterally; normal respiratory effort CV:         Regular rate and rhythm; no murmurs Abd:      + bowel sounds; soft, non-tender; no palpable masses, no distension Ext:    No edema; adequate peripheral perfusion Skin:      Warm and dry; no rash Neuro: alert and oriented x 3 Psych: normal mood and affect  Data Reviewed: Imaging: CT abdomen pelvis 08/15/19 - visualized lung bases are unclear CT angiogram 09/12/20 - no PE, minimal atelectasis at the base CT angiogram 03/18/21 - multifocal pneumonia involving right upper middle and lower lobes and left lower lobe I have reviewed the images personally.  PFTs:  Labs:  Assessment:  Evaluation for interstitial lung disease I have reviewed his imaging dating back to 2021. He has developed some consolidation and interstitial markings in January of this year after recent hospitalization with severe pneumonia and RSV infection. I suspect these changes are related to his resolving infection and do not think there is any underlying interstitial lung disease as previous imaging in 2021 and 2022 showed minimal atelectasis only  Schedule High res CT and PFTs for further evaluation Return to clinic for review of the status and plan for next steps as needed.  Plan/Recommendations: High res CT, PFTs  Marshell Garfinkel MD [facility] 08/06/2021, 10:25 AM  CC: [name], MD

## 2021-08-06 NOTE — Patient Instructions (Signed)
Will schedule high-resolution CT for better evaluation of the lung Schedule PFTs at next available and follow-up in clinic in 1 to 2 months after PFTs.

## 2021-08-14 DIAGNOSIS — M6281 Muscle weakness (generalized): Secondary | ICD-10-CM | POA: Diagnosis not present

## 2021-08-14 DIAGNOSIS — M5416 Radiculopathy, lumbar region: Secondary | ICD-10-CM | POA: Diagnosis not present

## 2021-08-14 DIAGNOSIS — M5136 Other intervertebral disc degeneration, lumbar region: Secondary | ICD-10-CM | POA: Diagnosis not present

## 2021-08-18 ENCOUNTER — Other Ambulatory Visit: Payer: Self-pay | Admitting: Surgery

## 2021-08-18 DIAGNOSIS — I719 Aortic aneurysm of unspecified site, without rupture: Secondary | ICD-10-CM

## 2021-08-26 DIAGNOSIS — M5136 Other intervertebral disc degeneration, lumbar region: Secondary | ICD-10-CM | POA: Diagnosis not present

## 2021-08-26 DIAGNOSIS — M6281 Muscle weakness (generalized): Secondary | ICD-10-CM | POA: Diagnosis not present

## 2021-08-26 DIAGNOSIS — M5416 Radiculopathy, lumbar region: Secondary | ICD-10-CM | POA: Diagnosis not present

## 2021-08-28 DIAGNOSIS — M5416 Radiculopathy, lumbar region: Secondary | ICD-10-CM | POA: Diagnosis not present

## 2021-08-28 DIAGNOSIS — M5136 Other intervertebral disc degeneration, lumbar region: Secondary | ICD-10-CM | POA: Diagnosis not present

## 2021-08-28 DIAGNOSIS — M6281 Muscle weakness (generalized): Secondary | ICD-10-CM | POA: Diagnosis not present

## 2021-09-01 ENCOUNTER — Ambulatory Visit
Admission: RE | Admit: 2021-09-01 | Discharge: 2021-09-01 | Disposition: A | Discharge status: Home / Self Care | Payer: Medicare PPO | Source: Ambulatory Visit | Attending: Pulmonary Disease | Admitting: Pulmonary Disease

## 2021-09-01 DIAGNOSIS — I3139 Other pericardial effusion (noninflammatory): Secondary | ICD-10-CM | POA: Diagnosis not present

## 2021-09-01 DIAGNOSIS — J90 Pleural effusion, not elsewhere classified: Secondary | ICD-10-CM | POA: Diagnosis not present

## 2021-09-01 DIAGNOSIS — J479 Bronchiectasis, uncomplicated: Secondary | ICD-10-CM | POA: Diagnosis not present

## 2021-09-01 DIAGNOSIS — J84112 Idiopathic pulmonary fibrosis: Secondary | ICD-10-CM | POA: Diagnosis not present

## 2021-09-01 DIAGNOSIS — J849 Interstitial pulmonary disease, unspecified: Secondary | ICD-10-CM

## 2021-09-01 IMAGING — CT CT CHEST HIGH RESOLUTION
2 of 7 series · 13 of 36 positions shown, 16 images · non-contrast
Comparison: [DATE] chest CT angiogram.

CLINICAL DATA: Interstitial lung disease (Ped 1-17y). History of
prostate cancer.

* Tracking Code: BO *
EXAM:
CT CHEST WITHOUT CONTRAST
TECHNIQUE: Multidetector CT imaging of the chest was performed following the
standard protocol without intravenous contrast. High resolution
imaging of the lungs, as well as inspiratory and expiratory imaging,
was performed.
RADIATION DOSE REDUCTION: This exam was performed according to the
departmental dose-optimization program which includes automated
exposure control, adjustment of the mA and/or kV according to
patient size and/or use of iterative reconstruction technique.

[Series 4: chest 2.00 br36 s3 cor soft · coronal · 0.58mm/px · 3 of 154 slices shown]
[im 31/154  lung]
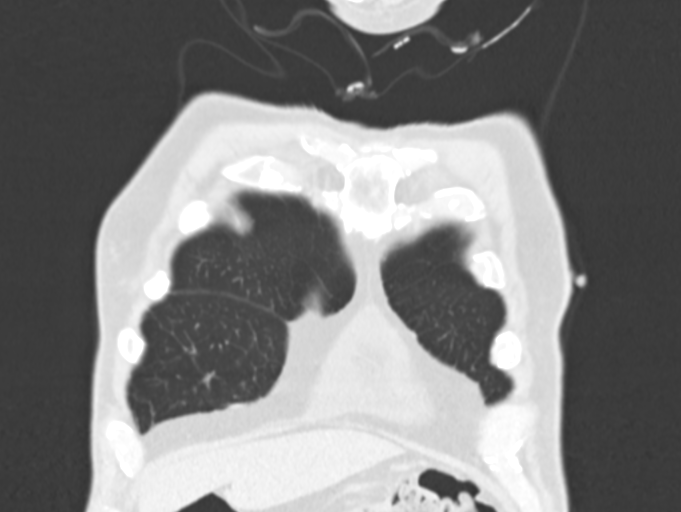
[im 62/154  lung]
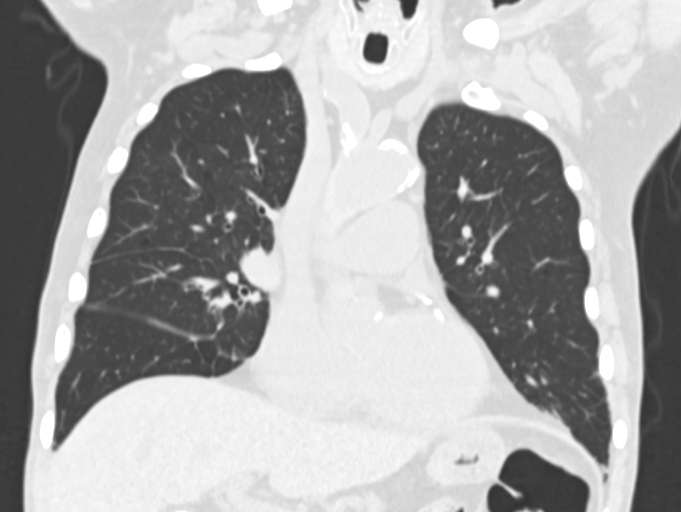
[im 92/154  lung]
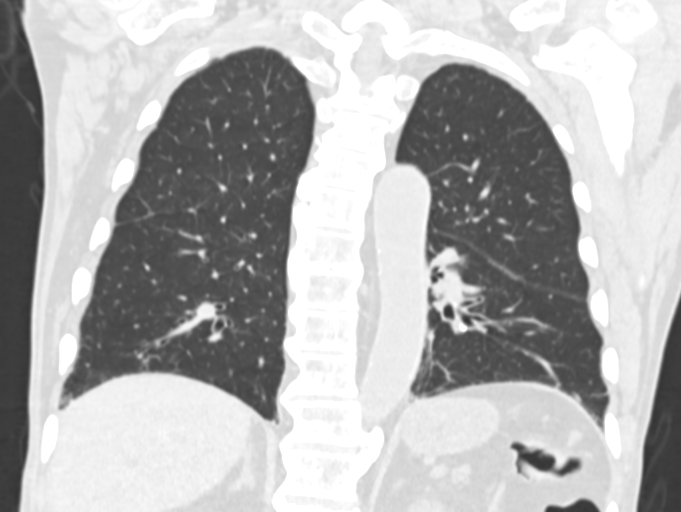

[Series 11: chest 1.00 br60 s3 high res thins 1x1 mm · axial · 0.79mm/px · z∈[+1498,+1748]mm · 10 of 296 slices shown, 13 images]
[im 23/296  mediastinal]
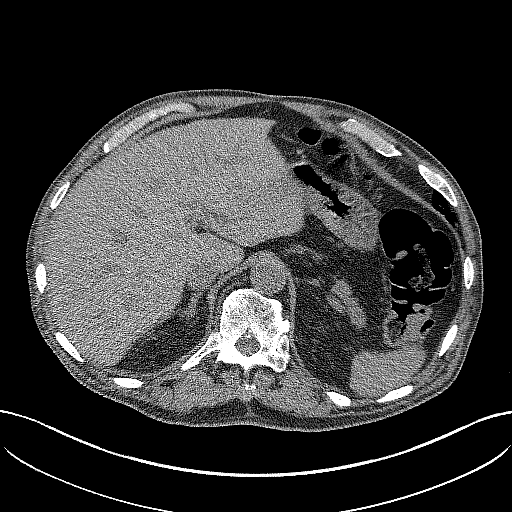
[im 23/296  lung]
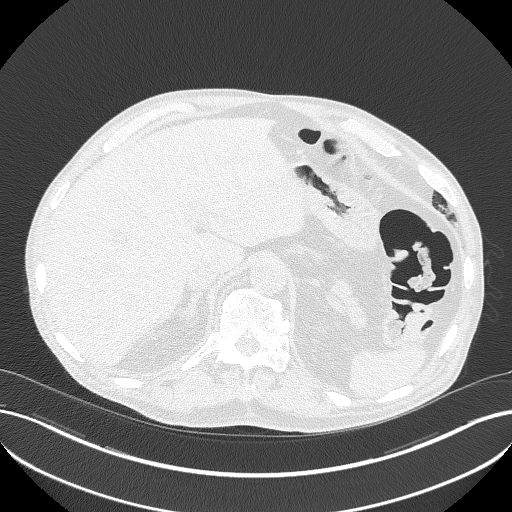
[im 46/296  lung]
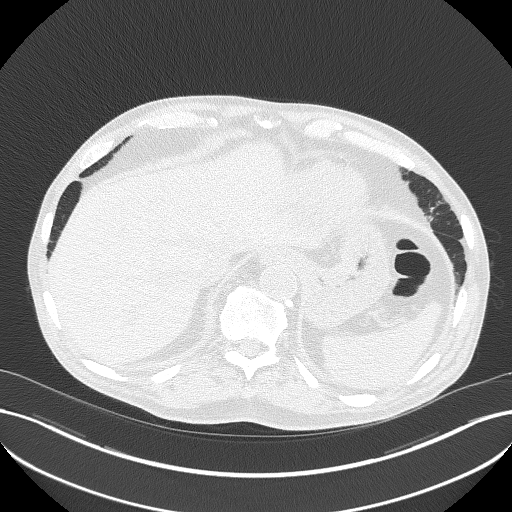
[im 91/296  lung]
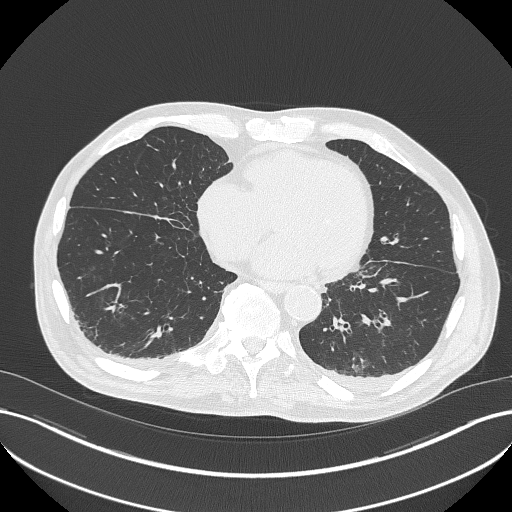
[im 114/296  lung]
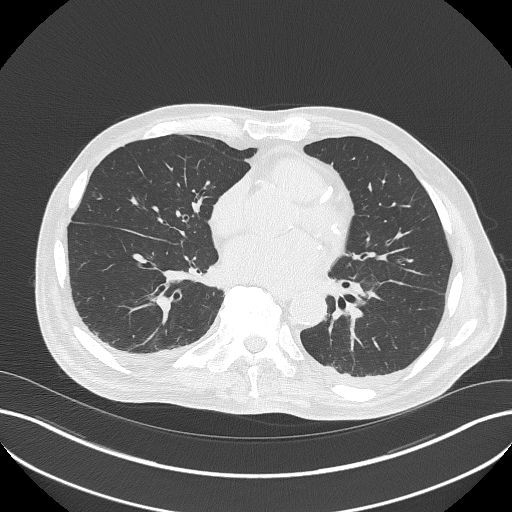
[im 137/296  mediastinal]
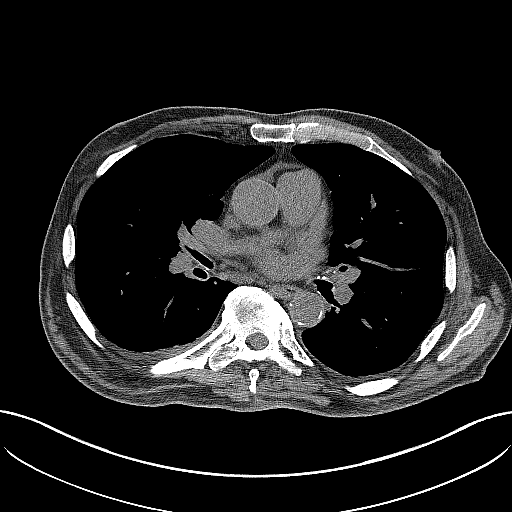
[im 137/296  lung]
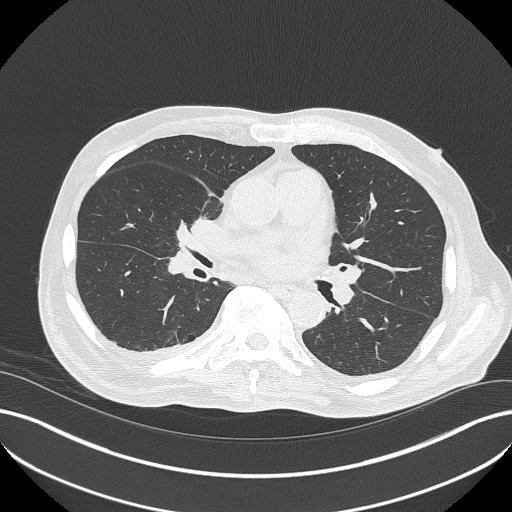
[im 159/296  lung]
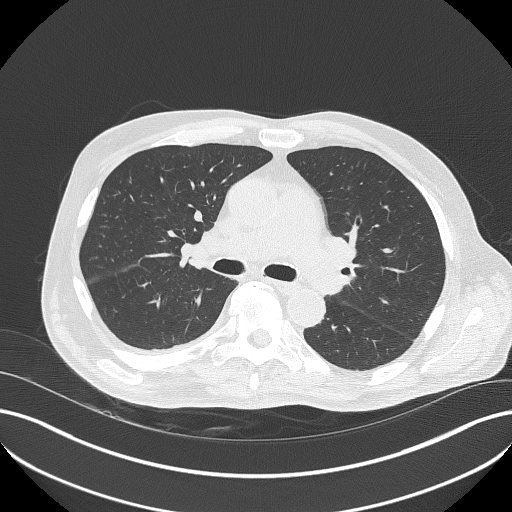
[im 182/296  lung]
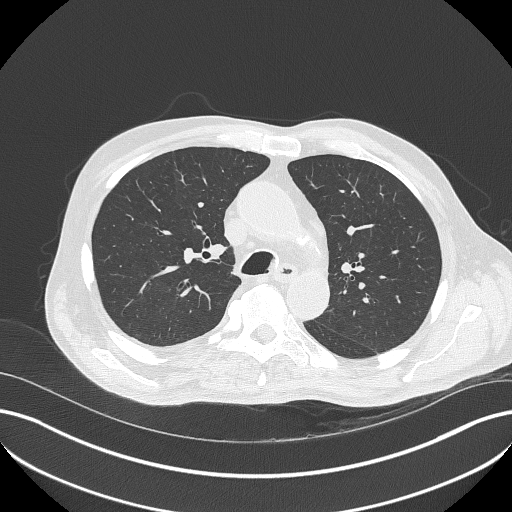
[im 227/296  lung]
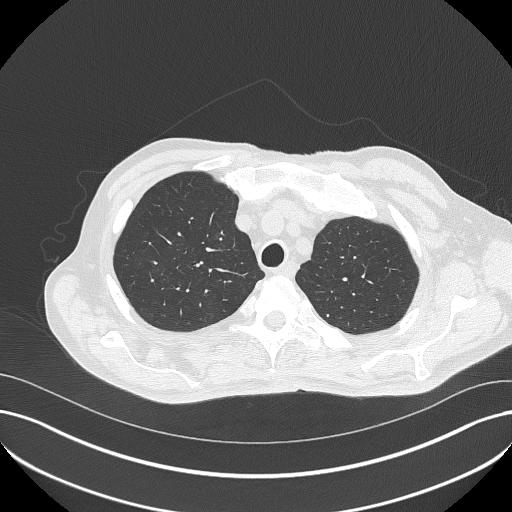
[im 250/296  mediastinal]
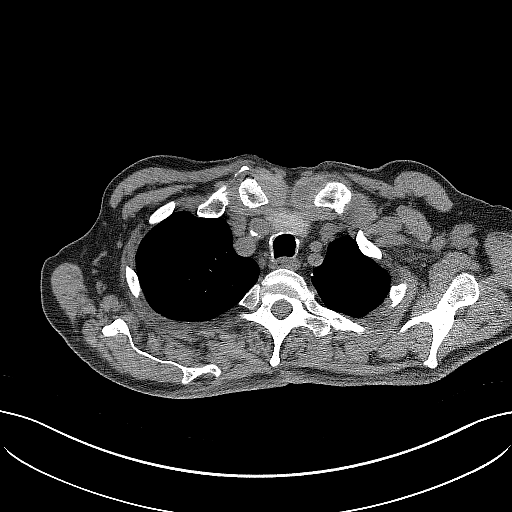
[im 250/296  lung]
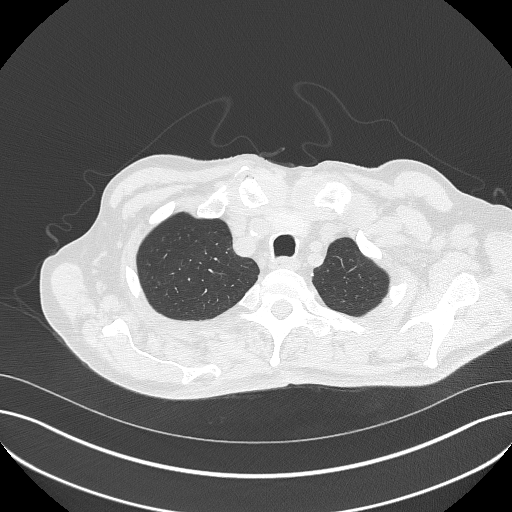
[im 273/296  lung]
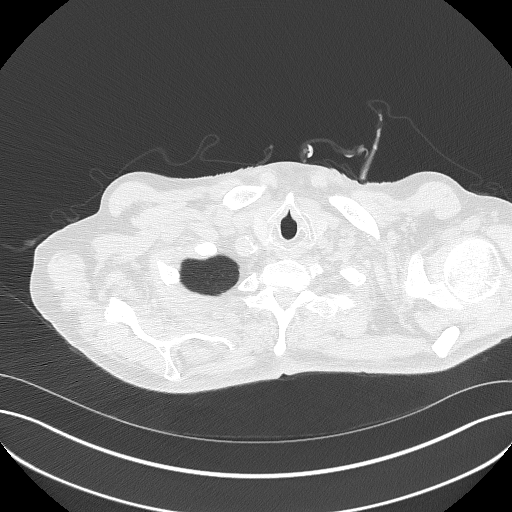

[13 of 36 positions shown; findings below may reference images not displayed]

FINDINGS: Cardiovascular: Normal heart size. Small pericardial
effusion/thickening, new. Three-vessel coronary atherosclerosis.
Atherosclerotic nonaneurysmal thoracic aorta. Chronic penetrating
atherosclerotic ulcer in the aortic isthmus measures approximately
1.0 cm, not appreciably changed. Top-normal caliber main pulmonary
artery (3.4 cm diameter).

Mediastinum/Nodes: No discrete thyroid nodules. Unremarkable
esophagus. No axillary adenopathy. Coarsely calcified nonenlarged
left hilar nodes are unchanged and compatible with prior
granulomatous disease. No pathologically enlarged mediastinal or
discrete hilar nodes on these noncontrast images.

Lungs/Pleura: No pneumothorax. Trace dependent bilateral pleural
effusions. Solid 0.4 cm peripheral lingula subpleural pulmonary
nodule (series 8/image 108), stable back to [DATE] CT and
presumably benign. No acute consolidative airspace disease, lung
masses or new significant pulmonary nodules. Small clustered
calcified granulomas in the posterior right upper lobe are
unchanged. No significant lobular air trapping or evidence of
tracheobronchomalacia on the expiration sequence. Mild patchy
subpleural reticulation and ground-glass opacity with associated
mild traction bronchiolectasis and mild architectural distortion.
There is a strong basilar predominance to these findings. Previously
visualized patchy consolidation at the dependent left lung base has
resolved, with residual mildly thickened parenchymal band in this
location. Chronic mildly thickened parenchymal band at the dependent
right lung base is unchanged. Findings are otherwise not
substantially changed in the interval since [DATE] chest CT. No
frank honeycombing.

Upper abdomen: No acute abnormality.

Musculoskeletal: No aggressive appearing focal osseous lesions.
Moderate thoracic spondylosis.
IMPRESSION: 1. Mild patchy subpleural reticulation and ground-glass opacity with
associated mild traction bronchiolectasis at both lung bases without
honeycombing. An interstitial lung disease including UIP is
possible. Given the previously visualized and now resolved patchy
consolidation at the lung bases on the prior scan, findings may
alternatively represent bland postinfectious/postinflammatory
scarring. Consider follow-up high-resolution chest CT study in 12
months to assess temporal pattern stability, as clinically
warranted. Findings are indeterminate for UIP per consensus
guidelines: Diagnosis of Idiopathic Pulmonary Fibrosis: An Official
ATS/ERS/JRS/ALAT Clinical Practice Guideline. Am J Respir Crit Care
Med Vol 198, RIGE 5, [RB]-e[DATE].
2. Trace dependent bilateral pleural effusions.
3. Small pericardial effusion/thickening, new.
4. Three-vessel coronary atherosclerosis.
5. Stable small chronic penetrating atherosclerotic ulcer in the
aortic isthmus.
6. Aortic Atherosclerosis ([RB]-[RB]).

## 2021-09-18 ENCOUNTER — Other Ambulatory Visit (HOSPITAL_COMMUNITY): Payer: Self-pay

## 2021-09-18 DIAGNOSIS — M25462 Effusion, left knee: Secondary | ICD-10-CM | POA: Diagnosis not present

## 2021-09-18 DIAGNOSIS — M25562 Pain in left knee: Secondary | ICD-10-CM | POA: Diagnosis not present

## 2021-09-18 DIAGNOSIS — R296 Repeated falls: Secondary | ICD-10-CM | POA: Diagnosis not present

## 2021-09-18 DIAGNOSIS — E1140 Type 2 diabetes mellitus with diabetic neuropathy, unspecified: Secondary | ICD-10-CM | POA: Diagnosis not present

## 2021-09-18 MED ORDER — TRULICITY 3 MG/0.5ML ~~LOC~~ SOAJ
SUBCUTANEOUS | 3 refills | Status: DC
  Filled 2021-09-18: qty 6, 84d supply, fill #0

## 2021-09-22 ENCOUNTER — Ambulatory Visit: Payer: Medicare PPO | Admitting: Cardiology

## 2021-09-24 ENCOUNTER — Ambulatory Visit: Payer: Medicare PPO | Admitting: Cardiology

## 2021-09-24 NOTE — Progress Notes (Deleted)
Patient referred by Harry Bowen, MD for syncope  Subjective:   Harry Zamora, male    DOB: 01-19-1941, 81 y.o.   MRN: [id number]   No chief complaint on file.    HPI  81 y.o. Caucasian male with uncontrolled type 2 diabetes mellitus, CAD, PAD, syncope, spinal stenosis s/p prior surgeries  ***Patient has continued to have episodes of lightheadedness in spite of stopping Minipress. H had a brief fall on his buttocks while standing in the lobby, did not hit his head.   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  He is very active, walks for 30 minutes most days, and works in the yard regularly.  At baseline, he does not have any symptoms of chest pain, shortness of breath, claudication with exertion.  He reports occasional episodes of shortness of breath just before going to bed.  He denies any orthopnea, PND, leg edema symptoms.  Patient with had an episode of syncope about 2 weeks ago.  He woke up at 6 AM, as usual, had his coffee.  He had not had his breakfast yet.  He was sitting at dining table.  He does not recollect having any chest pain, shortness of breath, lightheadedness symptoms.  Next thing he remembers that he was down on the floor with his wife standing next to him.  His wife called his nephew, who assisted him to sit up.  The entire episode lasted for less than a minute.  On waking up, patient did not have any confusion, loss of bowel or bladder tone, tongue bite, seizures.  Patient did not have any other symptoms rest of the day.  He has not had any recurrence of similar episodes since then.  On a separate note, patient tells me that he has had spinal surgeries in the past, and was going to undergo surgery for removal of the rods today.  However, surgery was postponed given his syncope episode, as well as uncontrolled diabetes with A1c of 9.8%.  Patient denies any claudication symptoms.  However, he has noted an area on his  second toe left foot.  This started as a area of redness which he reportedly tried to pick at.  Now the area is black and discolored.  He denies any pain at the site.  Current Outpatient Medications:    buPROPion (WELLBUTRIN SR) 150 MG 12 hr tablet, Take 150 mg by mouth daily., Disp: , Rfl:    Calcium Carb-Cholecalciferol (CALCIUM 1000 + D PO), Take by mouth., Disp: , Rfl:    clopidogrel (PLAVIX) 75 MG tablet, Take 1 tablet (75 mg total) by mouth daily. **Restart Plavix on 04/29/2021**, Disp: 90 tablet, Rfl: 3   Dulaglutide (TRULICITY) 3 JE/5.6DJ SOPN, Inject 3 mg into the skin once a week., Disp: 6 mL, Rfl: 3   Dulaglutide (TRULICITY) 3 SH/7.82YO SOPN, Inject under the skin once a week as directed, Disp: 6 mL, Rfl: 3   DULoxetine (CYMBALTA) 30 MG capsule, Take 30 mg by mouth daily., Disp: , Rfl:    Ferrous Sulfate Dried (HIGH POTENCY IRON) 65 MG TABS, Take by mouth., Disp: , Rfl:    lovastatin (MEVACOR) 40 MG tablet, Take 40 mg by mouth daily., Disp: , Rfl:    meloxicam (MOBIC) 15 MG tablet, Take 15 mg by mouth daily., Disp: , Rfl:    metFORMIN (GLUCOPHAGE) 500 MG tablet, Take 2 tablets (1,000 mg total) by mouth 2 (two) times daily with a meal. Resume  10/03/2020, Disp: , Rfl: 1   MULTIPLE MINERALS PO, Take by mouth., Disp: , Rfl:    nitroGLYCERIN (NITROSTAT) 0.4 MG SL tablet, Place 1 tablet (0.4 mg total) under the tongue every 5 (five) minutes as needed for chest pain., Disp: 30 tablet, Rfl: 3   rOPINIRole (REQUIP) 4 MG tablet, Take 4 mg by mouth at bedtime., Disp: , Rfl:    rosuvastatin (CRESTOR) 40 MG tablet, Take 1 tablet (40 mg total) by mouth daily., Disp: 90 tablet, Rfl: 3   vitamin B-12 (CYANOCOBALAMIN) 500 MCG tablet, Take 500 mcg by mouth daily., Disp: , Rfl:   Cardiovascular and other pertinent studies:  CT Chest 09/01/2021: 1. Mild patchy subpleural reticulation and ground-glass opacity with associated mild traction bronchiolectasis at both lung bases without honeycombing. An  interstitial lung disease including UIP is possible. Given the previously visualized and now resolved patchy consolidation at the lung bases on the prior scan, findings may alternatively represent bland postinfectious/postinflammatory scarring. Consider follow-up high-resolution chest CT study in 12 months to assess temporal pattern stability, as clinically warranted. Findings are indeterminate for UIP per consensus guidelines: Diagnosis of Idiopathic Pulmonary Fibrosis: An Official ATS/ERS/JRS/ALAT Clinical Practice Guideline. [place], Iss 5, [phone], Nov 14 2016. 2. Trace dependent bilateral pleural effusions. 3. Small pericardial effusion/thickening, new. 4. Three-vessel coronary atherosclerosis. 5. 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 6. Aortic Atherosclerosis (ICD10-I70.0).    EKG 08/04/2021: Sinus rhythm 92 bpm Occasional PVC Old anteroseptal infarct  Carotid duplex 12/11/2020: Right Carotid: Velocities in the right ICA are consistent with a 1-39%  stenosis.  Left Carotid: Velocities in the left ICA are consistent with a 1-39%  stenosis.  Vertebrals:  Bilateral vertebral arteries demonstrate antegrade flow.  Subclavians: Normal flow hemodynamics were seen in bilateral subclavian               arteries.   Coronary intervention 10/01/2020: Successful percutaneous coronary intervention LAD and ramus with simultaneous T-stenting        PTCA and stent placement 3.0 X 38 mm Onyx Frontier drug-eluting stent Ostial-mid LAD        PTCA and stent placement 3.0 X 26 mm Resolute Onyx drug-eluting stent        Final kissing balloon angioplasty with 3.5X15 mm [place] balloon in LAD & 2.75X15 mm [place] balloon in ramus   LAD stenosis 90%-->0% Ramus stenosis 80%-->0% TIMI flow III-->III  Coronary angiography 09/17/2020: LM: Normal LAD: Long ostial to mid LAD 90% stenosis        Small high diag 1 patent        Subtotally  occluded diag 2, collaterals from OM Ramus: Medium sized vessel with mid 70% stenosis LCx: Large,dominant. Normal RCA: Small, non-dominant. Prox 60% stenosis   LVEDP normal   Obstructive CAD could possible be a cause of his syncope, although this is only speculative. He feels tired after walking for 2 miles, although denies any specific angina symptoms.  Based on his coronary angiogram findings, perioperative cardiac risk is elevated. Will discuss with Dr. Erline Levine re: urgency and nature of his upcoming spine surgery and then discuss further recommendations re: CAD  Mobile cardiac telemetry 11 days 08/28/2020 - 09/09/2020: Dominant rhythm: Sinus. HR 57-123 bpm. Avg HR 78 bpm, while in sinus rhythm. 17 episodes of SVT, fastest at 207 bpm for 13 beats, longest for 20 beats at 140 bpm. <1% isolated SVE, couplet/triplets. 1 episode of VT, at 162 bpm for 5  beats 2.2% isolated VE, <1% couplet No atrial fibrillation/atrial flutter/high grade AV block, sinus pause >3sec noted. 5 patient triggered events, all correlated with sinus rhythm  Exercise Tetrofosmin stress test 08/28/2020: Exercise nuclear stress test was performed using Bruce protocol. Patient reached 4.6 METS, and 93% of age predicted maximum heart rate. Exercise capacity was low. No chest pain reported. Heart rate and hemodynamic response were normal. Stress EKG showed sinus tachycardia, old anteroseptal infarct, no significant ST-T changes. SPECT images show small sized, severe intensity, reversible perfusion defect in apical anterior myocardium, with associated mild hypokinesis. Stress LVEF 47%. High risk study.  Echocardiogram 08/28/2020:    Mildly depressed LV systolic function with EF 50%. Left ventricle cavity  is normal in size. Mild concentric hypertrophy of the left ventricle.  Normal global wall motion. Doppler evidence of grade I (impaired)  diastolic dysfunction, elevated LAP. Calculated EF 50%.  Left atrial cavity is  mildly dilated by volume index.  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  Lower Extremity Arterial Duplex 08/28/2020:  No hemodynamically significant stenoses are identified in the right or the  left lower extremity arterial system.  There is monophasic waveform in the bilateral AT and PT vessels and left  PT has spectral broadening suggestive of severe diffuse disease.  This exam reveals moderately decreased perfusion of the right lower  extremity, noted at the anterior tibial artery level (ABI 0.73) and  moderately decreased perfusion of the left lower extremity, noted at the  post tibial artery level (ABI 0.73).   CT lumbar spine 06/26/2020: 1. Prior L3-S1 PLIF with fracture of the left S1 anterior screw and loosening of the right S1 anterior and posterior screws. The right posterior screw traverses the right L5-S1 facet joint. No significant interbody fusion at any level. 2. Residual moderate right neuroforaminal stenosis at L5-S1. Asymmetric underfilling of the right S1 nerve root with suspected scar tissue surrounding the nerve root in the lateral recess 3. Progressive adjacent segment disease at L2-L3 with new moderate spinal canal and right neuroforaminal stenosis. 4. Nonobstructive right nephrolithiasis. 5. Aortic Atherosclerosis (ICD10-I70.0).     ABI 08/30/2019: Right: Velocities suggest 50-74% stenosis in the proximal PTA and 30-49%  stenosis in the proximal Peroneal a. Tibial vessel disease.  Left: No obvious evidence of stenosis. Tibial vessel disease.    Recent labs: 10/03/2020: Glucose 206, BUN/Cr 27/1.38. EGFR 51. Na/K 141/5.8.  Chol 137, TG 121, HDL 33, LDL 82  10/01/2020: Glucose 169, BUN/Cr 31/1.38. EGFR 51. Na/K 137/5.8.  H/H 12/38. MCV 97. Platelets 234  08/05/2020: Glucose 232, BUN/Cr 28/1.26. EGFR 58. Na/K 137/4.4.  H/H 13/42. MCV 96. Platelets 206 HbA1C 9.5% Lipid panel N/A TSH N/A  09/20/2019: Chol 123, TG 183, HDL 32, LDL 54   Review of  Systems  Cardiovascular:  Negative for chest pain, dyspnea on exertion, leg swelling, palpitations and syncope.  Neurological:  Positive for light-headedness.        No data found.    There were no vitals filed for this visit.    There is no height or weight on file to calculate BMI. There were no vitals filed for this visit.   No data found.    Objective:   Physical Exam Vitals and nursing note reviewed.  Constitutional:      General: He is not in acute distress. Neck:     Vascular: No JVD.  Cardiovascular:     Rate and Rhythm: Normal rate and regular rhythm.     Pulses:  Femoral pulses are 2+ on the right side and 2+ on the left side.      Popliteal pulses are 2+ on the right side and 1+ on the left side.       Dorsalis pedis pulses are 2+ on the right side and 0 on the left side.       Posterior tibial pulses are 0 on the right side and 0 on the left side.     Heart sounds: Normal heart sounds. No murmur heard. Pulmonary:     Effort: Pulmonary effort is normal.     Breath sounds: Normal breath sounds. No wheezing or rales.  Musculoskeletal:     Right lower leg: No edema.     Left lower leg: No edema.         Assessment & Recommendations:   81 y.o. Caucasian male with uncontrolled type 2 diabetes mellitus, CAD, PAD, recurrent syncope, spinal stenosis s/p prior surgeries  *** Lightheadedness: I reckon this is due to hypovolemia, suspect due to [place].  Stop Farxiga, increase hydration. Avoid driving at this time.   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 Continue plavix, metoprolol succinate 25 mg daily. Continue rosuvastatin 40 mg daily.  *** PAD: B/l ABI 0.73 (08/2020) No critical limb ischemia or lifestyle limiting claudication Previously had a focal eschar on left foot 2nd toe, which could have been an atheroembolic  event. Likely source could be distal aortic arahc aulcer. Now completely healed. Continue medical management and regular walking Continue plavix. Currently not taking pletal, but does not have severe claudication symptoms,.  *** Distal aortic arch ulcer: Repeat CT in 6 months as per CVTS  F/u in 4 weeks    Nigel Mormon, MD Pager: [phone] Office: [phone]

## 2021-09-30 ENCOUNTER — Other Ambulatory Visit (HOSPITAL_COMMUNITY): Payer: Self-pay

## 2021-10-01 ENCOUNTER — Ambulatory Visit (INDEPENDENT_AMBULATORY_CARE_PROVIDER_SITE_OTHER): Payer: Medicare PPO

## 2021-10-01 ENCOUNTER — Ambulatory Visit: Payer: Medicare PPO | Admitting: Orthopaedic Surgery

## 2021-10-01 VITALS — Ht 74.0 in | Wt 165.2 lb

## 2021-10-01 DIAGNOSIS — M25562 Pain in left knee: Secondary | ICD-10-CM | POA: Diagnosis not present

## 2021-10-01 DIAGNOSIS — M1712 Unilateral primary osteoarthritis, left knee: Secondary | ICD-10-CM

## 2021-10-01 MED ORDER — METHYLPREDNISOLONE ACETATE 40 MG/ML IJ SUSP
80.0000 mg | INTRAMUSCULAR | Status: AC | PRN
  Administered 2021-10-01: 80 mg via INTRA_ARTICULAR

## 2021-10-01 MED ORDER — BUPIVACAINE HCL 0.25 % IJ SOLN
2.0000 mL | INTRAMUSCULAR | Status: AC | PRN
  Administered 2021-10-01: 2 mL via INTRA_ARTICULAR

## 2021-10-01 MED ORDER — LIDOCAINE HCL 1 % IJ SOLN
2.0000 mL | INTRAMUSCULAR | Status: AC | PRN
  Administered 2021-10-01: 2 mL

## 2021-10-01 NOTE — Progress Notes (Signed)
Office Visit Note   Patient: Harry Zamora           Date of Birth: [birth year]           MRN: [id number] Visit Date: 10/01/2021              Requested by: [name], MD [place],  [place] [address] PCP: [name], MD   Assessment & Plan: Visit Diagnoses:  1. Acute pain of left knee   2. Unilateral primary osteoarthritis, left knee     Plan: Harry Zamora has about a 5-monthhistory of insidious onset left knee pain.  He relates having frequent falling and uses a cane.  Cause may be related to back surgery he had in February of this year.  He feels like his "balance is off".  Sometimes he feels like the knee may give way.  He is never had any problems with the knee in the past or surgeries.  Has not taken anything for for pain and describes that the problem with the knee is aching throbbing.  He does have some medial joint pain with some very minimal varus.  Minimal effusion.  X-rays demonstrate some degenerative changes in the knee without ectopic soft tissue calcification.  I am going to inject his knee with cortisone and monitor response and have him return in 2 weeks.  He is a diabetic and will need to check his blood sugars over the next 3 to 4 days.  At some point he might be a candidate for viscosupplementation and this makes a difference  Follow-Up Instructions: Return in about 2 weeks (around 10/15/2021).   Orders:  Orders Placed This Encounter  Procedures   XR KNEE 3 VIEW LEFT   No orders of the defined types were placed in this encounter.     Procedures: Large Joint Inj: L knee on 10/01/2021 10:31 AM Indications: pain and diagnostic evaluation Details: 25 G 1.5 in needle, anteromedial approach  Arthrogram: No  Medications: 2 mL lidocaine 1 %; 80 mg methylPREDNISolone acetate 40 MG/ML; 2 mL bupivacaine 0.25 % Procedure, treatment alternatives, risks and benefits explained, specific risks discussed. Consent was given by the patient. Patient was  prepped and draped in the usual sterile fashion.       Clinical Data: No additional findings.   Subjective: Chief Complaint  Patient presents with   Left Knee - New Patient (Initial Visit)   Patient presents today as a new patient for his left knee  pain. Patient states that knee pain was noticed after he ha back surgery on 04/24/2021. He states that he has been frequently falling with his last fall around six weeks ago. He denies having any previous left knee surgeries. He is not wearing any knee braces however he is ambulating with a cane. Aching and throbbing pains are not constant however are noticed with sitting and standing positions. No medications are being used for pain.  Review of Systems   Objective: Vital Signs: Ht '6\' 2"'$  (1.88 m)   Wt 165 lb 3.2 oz (74.9 kg)   BMI 21.21 kg/m   Physical Exam Constitutional:      Appearance: He is well-developed.  Pulmonary:     Effort: Pulmonary effort is normal.  Skin:    General: Skin is warm and dry.  Neurological:     Mental Status: He is alert and oriented to person, place, and time.  Psychiatric:        Behavior: Behavior normal.  Ortho Exam awake alert and oriented x3.  Comfortable sitting.  Does use a cane for ambulation related to his balance.  The left knee was not hot red warm or swollen.  Slight varus.  May be a very minimal effusion.  Mostly medial joint pain diffusely.  Lacked a few degrees to full extension but flexed at least 105 without instability.  No popliteal pain or mass.  No calf pain.  Painless range of motion both hips with some hypertrophic change along the medial compartment particularly on the tibial side  Specialty Comments:  No specialty comments available.  Imaging: XR KNEE 3 VIEW LEFT  Result Date: 10/01/2021 Films of the left knee were obtained in 3 projections standing.  There is about 1 degree of varus and some narrowing of the medial joint space.  Slight subchondral sclerosis on the tibia  but no peripheral osteophytes or other ectopic calcification.  Some irregularity about the patellofemoral joint consistent with arthritis.    PMFS History: Patient Active Problem List   Diagnosis Date Noted   Unilateral primary osteoarthritis, left knee 10/01/2021   Lightheadedness 08/04/2021   Lumbar spinal stenosis 04/24/2021   Sepsis ([facility]) 03/13/2021   AKI (acute kidney injury) ([facility]) 03/13/2021   Elevated CK 03/13/2021   Severe sepsis ([facility]) 03/13/2021   PNA (pneumonia) 03/12/2021   Restless legs    Acute respiratory failure with hypoxia (HCC)    Coronary artery disease of native artery of native heart with stable angina pectoris ([facility]) 09/19/2020   Abnormal stress test 09/16/2020   Hyperkalemia 09/12/2020   Acute renal failure superimposed on stage 3a chronic kidney disease ([facility]) 09/12/2020   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[facility]) 09/12/2020   Type 2 diabetes mellitus ([facility])    Syncope and collapse 08/07/2020   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   PAD (peripheral artery disease) ([facility]) 08/30/2019   Spondylolisthesis of lumbar region 07/13/2019   Spinal stenosis of lumbar region with neurogenic claudication 10/02/2016   Diabetic polyneuropathy ([facility]) 11/10/2013   Past Medical History:  Diagnosis Date   Anemia    as an infant   Arthritis    Back pain    Depression    Disc displacement, lumbar    Gynecomastia    History of kidney stones    Hyperlipidemia    Hypertension    no longer on medications   Hypertriglyceridemia    Insomnia    Low back pain    Lumbar radiculopathy    Lumbar stenosis    OA (osteoarthritis)    Polyneuropathy in diabetes(357.2)    Prostate cancer ([facility]) 2003   Restless legs    Ringing in ears    RLS (restless legs syndrome)    Type 2 diabetes mellitus ([facility])     Family History  Problem Relation Age of Onset   Arthritis Mother    Hypertension Mother    Diabetes Mother    Heart attack Mother    Cancer Father     Past Surgical History:   Procedure Laterality Date   ABDOMINAL EXPOSURE N/A 07/13/2019   Procedure: ABDOMINAL EXPOSURE;  Surgeon: Serafina Mitchell, MD;  Location: [facility];  Service: Vascular;  Laterality: N/A;  anterior approach   ANTERIOR LAT LUMBAR FUSION Right 07/13/2019   Procedure: Right Lumbar 3-4 Lumbar 4-5 Anterolateral lumbar interbody fusion;  Surgeon: Erline Levine, MD;  Location: [facility];  Service: Neurosurgery;  Laterality: Right;   ANTERIOR LUMBAR FUSION N/A 07/13/2019   Procedure: Lumbar Five-Sacral One Anterior lumbar interbody fusion;  Surgeon: Erline Levine, MD;  Location: [facility];  Service: Neurosurgery;  Laterality: N/A;  Anterior approach   APPLICATION OF INTRAOPERATIVE CT SCAN N/A 04/24/2021   Procedure: APPLICATION OF INTRAOPERATIVE CT SCAN;  Surgeon: Dawley, Theodoro Doing, DO;  Location: [facility];  Service: Neurosurgery;  Laterality: N/A;  3C/RM 21   CHOLECYSTECTOMY     COLONOSCOPY     CORONARY STENT INTERVENTION N/A 10/01/2020   Procedure: CORONARY STENT INTERVENTION;  Surgeon: Nigel Mormon, MD;  Location: [facility];  Service: Cardiovascular;  Laterality: N/A;   EYE SURGERY Bilateral    GALLBLADDER SURGERY     HEMORRHOID SURGERY  1983   LEFT HEART CATH AND CORONARY ANGIOGRAPHY N/A 09/17/2020   Procedure: LEFT HEART CATH AND CORONARY ANGIOGRAPHY;  Surgeon: Nigel Mormon, MD;  Location: [facility];  Service: Cardiovascular;  Laterality: N/A;   LUMBAR LAMINECTOMY/DECOMPRESSION MICRODISCECTOMY N/A 10/02/2016   Procedure: L3 to S1 Laminectomy;  Surgeon: Erline Levine, MD;  Location: [facility];  Service: Neurosurgery;  Laterality: N/A;  L3 to S1 Laminectomy   LUMBAR LAMINECTOMY/DECOMPRESSION MICRODISCECTOMY Bilateral 09/07/2019   Procedure: Bilateral Lumbar Five - Sacral One Foraminotomy;  Surgeon: Erline Levine, MD;  Location: [facility];  Service: Neurosurgery;  Laterality: Bilateral;  posterior   LUMBAR PERCUTANEOUS PEDICLE SCREW 3 LEVEL N/A 07/13/2019   Procedure: Percutaneous pedicle screw  fixation from Lumbar 3 to Sacral 1;  Surgeon: Erline Levine, MD;  Location: [facility];  Service: Neurosurgery;  Laterality: N/A;   MENISCUS REPAIR Right    PROSTATE SURGERY     SHOULDER SURGERY Left 01/2011   rotator cuff   Social History   Occupational History   Occupation: Banker: [employer]  Tobacco Use   Smoking status: Former    Types: Cigars    Quit date: 2022    Years since quitting: 1.5   Smokeless tobacco: Never   Tobacco comments:    occasional  Vaping Use   Vaping Use: Never used  Substance and Sexual Activity   Alcohol use: Not Currently    Comment: social-beer   Drug use: No   Sexual activity: Not on file

## 2021-10-02 ENCOUNTER — Other Ambulatory Visit: Payer: Self-pay | Admitting: Cardiology

## 2021-10-02 ENCOUNTER — Ambulatory Visit: Payer: Medicare PPO | Admitting: Cardiology

## 2021-10-02 ENCOUNTER — Encounter: Payer: Self-pay | Admitting: Cardiology

## 2021-10-02 VITALS — BP 138/47 | HR 46 | Temp 97.8°F | Resp 16 | Ht 74.0 in | Wt 160.0 lb

## 2021-10-02 DIAGNOSIS — I2510 Atherosclerotic heart disease of native coronary artery without angina pectoris: Secondary | ICD-10-CM

## 2021-10-02 DIAGNOSIS — R42 Dizziness and giddiness: Secondary | ICD-10-CM

## 2021-10-02 DIAGNOSIS — I739 Peripheral vascular disease, unspecified: Secondary | ICD-10-CM

## 2021-10-02 NOTE — Progress Notes (Signed)
Patient referred by [name], MD for syncope  Subjective:   Harry Zamora, male    DOB: 08/08/1940, 81 y.o.   MRN: [id number]   Chief Complaint  Patient presents with   Coronary Artery Disease   Follow-up    6 week     HPI  81 y.o. Caucasian male with uncontrolled type 2 diabetes mellitus, CAD, PAD, syncope, spinal stenosis s/p prior surgeries  Patient has continued to have episodes of lightheadedness, particularly on standing up, even after stopping [place]. He has not had any syncopal episode. He drinks 3X16 Oz bottles.   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  He is very active, walks for 30 minutes most days, and works in the yard regularly.  At baseline, he does not have any symptoms of chest pain, shortness of breath, claudication with exertion.  He reports occasional episodes of shortness of breath just before going to bed.  He denies any orthopnea, PND, leg edema symptoms.  Patient with had an episode of syncope about 2 weeks ago.  He woke up at 6 AM, as usual, had his coffee.  He had not had his breakfast yet.  He was sitting at dining table.  He does not recollect having any chest pain, shortness of breath, lightheadedness symptoms.  Next thing he remembers that he was down on the floor with his wife standing next to him.  His wife called his nephew, who assisted him to sit up.  The entire episode lasted for less than a minute.  On waking up, patient did not have any confusion, loss of bowel or bladder tone, tongue bite, seizures.  Patient did not have any other symptoms rest of the day.  He has not had any recurrence of similar episodes since then.  On a separate note, patient tells me that he has had spinal surgeries in the past, and was going to undergo surgery for removal of the rods today.  However, surgery was postponed given his syncope episode, as well as uncontrolled diabetes with A1c of 9.8%.  Patient denies  any claudication symptoms.  However, he has noted an area on his second toe left foot.  This started as a area of redness which he reportedly tried to pick at.  Now the area is black and discolored.  He denies any pain at the site.  Current Outpatient Medications:    buPROPion (WELLBUTRIN SR) 150 MG 12 hr tablet, Take 150 mg by mouth daily., Disp: , Rfl:    Calcium Carb-Cholecalciferol (CALCIUM 1000 + D PO), Take by mouth., Disp: , Rfl:    clopidogrel (PLAVIX) 75 MG tablet, Take 1 tablet (75 mg total) by mouth daily. **Restart Plavix on 04/29/2021**, Disp: 90 tablet, Rfl: 3   Dulaglutide (TRULICITY) 3 FW/2.6VZ SOPN, Inject 3 mg into the skin once a week., Disp: 6 mL, Rfl: 3   Dulaglutide (TRULICITY) 3 CH/8.8FO SOPN, Inject under the skin once a week as directed, Disp: 6 mL, Rfl: 3   DULoxetine (CYMBALTA) 30 MG capsule, Take 30 mg by mouth daily., Disp: , Rfl:    Ferrous Sulfate Dried (HIGH POTENCY IRON) 65 MG TABS, Take by mouth., Disp: , Rfl:    lovastatin (MEVACOR) 40 MG tablet, Take 40 mg by mouth daily., Disp: , Rfl:    meloxicam (MOBIC) 15 MG tablet, Take 15 mg by mouth daily., Disp: , Rfl:    metFORMIN (GLUCOPHAGE) 500 MG tablet, Take 2 tablets (1,000  mg total) by mouth 2 (two) times daily with a meal. Resume 10/03/2020, Disp: , Rfl: 1   MULTIPLE MINERALS PO, Take by mouth., Disp: , Rfl:    nitroGLYCERIN (NITROSTAT) 0.4 MG SL tablet, Place 1 tablet (0.4 mg total) under the tongue every 5 (five) minutes as needed for chest pain., Disp: 30 tablet, Rfl: 3   rOPINIRole (REQUIP) 4 MG tablet, Take 4 mg by mouth at bedtime., Disp: , Rfl:    rosuvastatin (CRESTOR) 40 MG tablet, Take 1 tablet (40 mg total) by mouth daily., Disp: 90 tablet, Rfl: 3   vitamin B-12 (CYANOCOBALAMIN) 500 MCG tablet, Take 500 mcg by mouth daily., Disp: , Rfl:   Cardiovascular and other pertinent studies:  EKG 10/02/2021: Sinus rhythm 75 bpm Borderline left atrial enlargement Occasional ectopic ventricular beat     Nonspecific T-abnormality  CT Chest 09/01/2021: 1. Mild patchy subpleural reticulation and ground-glass opacity with associated mild traction bronchiolectasis at both lung bases without honeycombing. An interstitial lung disease including UIP is possible. Given the previously visualized and now resolved patchy consolidation at the lung bases on the prior scan, findings may alternatively represent bland postinfectious/postinflammatory scarring. Consider follow-up high-resolution chest CT study in 12 months to assess temporal pattern stability, as clinically warranted. Findings are indeterminate for UIP per consensus guidelines: Diagnosis of Idiopathic Pulmonary Fibrosis: An Official ATS/ERS/JRS/ALAT Clinical Practice Guideline. [place], Iss 5, [phone], Nov 14 2016. 2. Trace dependent bilateral pleural effusions. 3. Small pericardial effusion/thickening, new. 4. Three-vessel coronary atherosclerosis. 5. 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 6. Aortic Atherosclerosis (ICD10-I70.0).   Carotid duplex 12/11/2020: Right Carotid: Velocities in the right ICA are consistent with a 1-39%  stenosis.  Left Carotid: Velocities in the left ICA are consistent with a 1-39%  stenosis.  Vertebrals:  Bilateral vertebral arteries demonstrate antegrade flow.  Subclavians: Normal flow hemodynamics were seen in bilateral subclavian               arteries.   Coronary intervention 10/01/2020: Successful percutaneous coronary intervention LAD and ramus with simultaneous T-stenting        PTCA and stent placement 3.0 X 38 mm Onyx Frontier drug-eluting stent Ostial-mid LAD        PTCA and stent placement 3.0 X 26 mm Resolute Onyx drug-eluting stent        Final kissing balloon angioplasty with 3.5X15 mm [place] balloon in LAD & 2.75X15 mm [place] balloon in ramus   LAD stenosis 90%-->0% Ramus stenosis 80%-->0% TIMI flow III-->III  Coronary  angiography 09/17/2020: LM: Normal LAD: Long ostial to mid LAD 90% stenosis        Small high diag 1 patent        Subtotally occluded diag 2, collaterals from OM Ramus: Medium sized vessel with mid 70% stenosis LCx: Large,dominant. Normal RCA: Small, non-dominant. Prox 60% stenosis   LVEDP normal   Obstructive CAD could possible be a cause of his syncope, although this is only speculative. He feels tired after walking for 2 miles, although denies any specific angina symptoms.  Based on his coronary angiogram findings, perioperative cardiac risk is elevated. Will discuss with Dr. Erline Levine re: urgency and nature of his upcoming spine surgery and then discuss further recommendations re: CAD  Mobile cardiac telemetry 11 days 08/28/2020 - 09/09/2020: Dominant rhythm: Sinus. HR 57-123 bpm. Avg HR 78 bpm, while in sinus rhythm. 17 episodes of SVT, fastest at 207 bpm for 13 beats, longest  for 20 beats at 140 bpm. <1% isolated SVE, couplet/triplets. 1 episode of VT, at 162 bpm for 5 beats 2.2% isolated VE, <1% couplet No atrial fibrillation/atrial flutter/high grade AV block, sinus pause >3sec noted. 5 patient triggered events, all correlated with sinus rhythm  Exercise Tetrofosmin stress test 08/28/2020: Exercise nuclear stress test was performed using Bruce protocol. Patient reached 4.6 METS, and 93% of age predicted maximum heart rate. Exercise capacity was low. No chest pain reported. Heart rate and hemodynamic response were normal. Stress EKG showed sinus tachycardia, old anteroseptal infarct, no significant ST-T changes. SPECT images show small sized, severe intensity, reversible perfusion defect in apical anterior myocardium, with associated mild hypokinesis. Stress LVEF 47%. High risk study.  Echocardiogram 08/28/2020:    Mildly depressed LV systolic function with EF 50%. Left ventricle cavity  is normal in size. Mild concentric hypertrophy of the left ventricle.  Normal global wall  motion. Doppler evidence of grade I (impaired)  diastolic dysfunction, elevated LAP. Calculated EF 50%.  Left atrial cavity is mildly dilated by volume index.  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  Lower Extremity Arterial Duplex 08/28/2020:  No hemodynamically significant stenoses are identified in the right or the  left lower extremity arterial system.  There is monophasic waveform in the bilateral AT and PT vessels and left  PT has spectral broadening suggestive of severe diffuse disease.  This exam reveals moderately decreased perfusion of the right lower  extremity, noted at the anterior tibial artery level (ABI 0.73) and  moderately decreased perfusion of the left lower extremity, noted at the  post tibial artery level (ABI 0.73).   CT lumbar spine 06/26/2020: 1. Prior L3-S1 PLIF with fracture of the left S1 anterior screw and loosening of the right S1 anterior and posterior screws. The right posterior screw traverses the right L5-S1 facet joint. No significant interbody fusion at any level. 2. Residual moderate right neuroforaminal stenosis at L5-S1. Asymmetric underfilling of the right S1 nerve root with suspected scar tissue surrounding the nerve root in the lateral recess 3. Progressive adjacent segment disease at L2-L3 with new moderate spinal canal and right neuroforaminal stenosis. 4. Nonobstructive right nephrolithiasis. 5. Aortic Atherosclerosis (ICD10-I70.0).     ABI 08/30/2019: Right: Velocities suggest 50-74% stenosis in the proximal PTA and 30-49%  stenosis in the proximal Peroneal a. Tibial vessel disease.  Left: No obvious evidence of stenosis. Tibial vessel disease.    Recent labs: 07/03/2021: Glucose 155, BUN/Cr 17/1.1. EGFR 53.  HbA1C 7.1% Chol 82, TG 147, HDL 30, LDL 23 (in 2022) \ 04/28/2021: Glucose 238, BUN/Cr 22/1.1. EGFR >60. Na/K 133/4.1. Rest of the CMP normal H/H 7.8/23.9. MCV 94. Platelets 203  02/2021: HbA1C 8.1% TSH 0.2  low  10/03/2020: Glucose 206, BUN/Cr 27/1.38. EGFR 51. Na/K 141/5.8.  Chol 137, TG 121, HDL 33, LDL 82  10/01/2020: Glucose 169, BUN/Cr 31/1.38. EGFR 51. Na/K 137/5.8.  H/H 12/38. MCV 97. Platelets 234  08/05/2020: Glucose 232, BUN/Cr 28/1.26. EGFR 58. Na/K 137/4.4.  H/H 13/42. MCV 96. Platelets 206 HbA1C 9.5% Lipid panel N/A TSH N/A  09/20/2019: Chol 123, TG 183, HDL 32, LDL 54   Review of Systems  Cardiovascular:  Negative for chest pain, dyspnea on exertion, leg swelling, palpitations and syncope.  Neurological:  Positive for light-headedness.         Vitals:   10/02/21 1036 10/02/21 1037  BP:    Pulse:    Resp:    Temp:    SpO2: 98%  98%   Orthostatic VS for the past 72 hrs (Last 3 readings):  Orthostatic BP Patient Position BP Location Cuff Size Orthostatic Pulse  10/02/21 1037 133/70 Sitting Left Arm Normal 80  10/02/21 1036 140/55 Sitting Left Arm Normal 51  10/02/21 1035 145/77 Supine Left Arm Normal 72     Body mass index is 20.54 kg/m. Filed Weights   10/02/21 1024  Weight: 160 lb (72.6 kg)     Objective:   Physical Exam Vitals and nursing note reviewed.  Constitutional:      General: He is not in acute distress. Neck:     Vascular: No JVD.  Cardiovascular:     Rate and Rhythm: Normal rate and regular rhythm.     Pulses:          Femoral pulses are 2+ on the right side and 2+ on the left side.      Popliteal pulses are 2+ on the right side and 1+ on the left side.       Dorsalis pedis pulses are 2+ on the right side and 0 on the left side.       Posterior tibial pulses are 0 on the right side and 0 on the left side.     Heart sounds: Normal heart sounds. No murmur heard. Pulmonary:     Effort: Pulmonary effort is normal.     Breath sounds: Normal breath sounds. No wheezing or rales.  Musculoskeletal:     Right lower leg: No edema.     Left lower leg: No edema.          ICD-10-CM   1. Syncope and collapse  R55     2. Coronary artery  disease of native artery of native heart with stable angina pectoris (HCC)  I25.118 EKG 12-Lead       Assessment & Recommendations:   81 y.o. Caucasian male with uncontrolled type 2 diabetes mellitus, CAD, PAD, recurrent syncope, spinal stenosis s/p prior surgeries  Lightheadedness: Persisted in spite of stopping [place].  He is does not have positive orthostatics, but symptoms are on standing up.  Encourage increasing fluid intake.  Avoid dehydration.    CAD:  Obstructive CAD with high risk anatomy, stable angina Successfully treated with LAD/ramus T stenting (10/01/2020) No angina symptoms at this time. Continue plavix, metoprolol succinate 25 mg daily. Continue rosuvastatin 40 mg daily.  PAD: B/l ABI 0.73 (08/2020) No critical limb ischemia or lifestyle limiting claudication Previously had a focal eschar on left foot 2nd toe, which could have been an atheroembolic event. Likely source could be distal aortic arahc aulcer. Now completely healed. Continue medical management and regular walking Continue plavix. Currently not taking pletal, but does not have severe claudication symptoms,.  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

## 2021-10-06 ENCOUNTER — Other Ambulatory Visit (HOSPITAL_COMMUNITY): Payer: Self-pay

## 2021-10-06 DIAGNOSIS — N200 Calculus of kidney: Secondary | ICD-10-CM | POA: Diagnosis not present

## 2021-10-06 DIAGNOSIS — R82998 Other abnormal findings in urine: Secondary | ICD-10-CM | POA: Diagnosis not present

## 2021-10-06 DIAGNOSIS — I25110 Atherosclerotic heart disease of native coronary artery with unstable angina pectoris: Secondary | ICD-10-CM | POA: Diagnosis not present

## 2021-10-06 DIAGNOSIS — I739 Peripheral vascular disease, unspecified: Secondary | ICD-10-CM | POA: Diagnosis not present

## 2021-10-06 DIAGNOSIS — E785 Hyperlipidemia, unspecified: Secondary | ICD-10-CM | POA: Diagnosis not present

## 2021-10-06 DIAGNOSIS — K760 Fatty (change of) liver, not elsewhere classified: Secondary | ICD-10-CM | POA: Diagnosis not present

## 2021-10-06 DIAGNOSIS — E1140 Type 2 diabetes mellitus with diabetic neuropathy, unspecified: Secondary | ICD-10-CM | POA: Diagnosis not present

## 2021-10-06 DIAGNOSIS — N1831 Chronic kidney disease, stage 3a: Secondary | ICD-10-CM | POA: Diagnosis not present

## 2021-10-06 DIAGNOSIS — I10 Essential (primary) hypertension: Secondary | ICD-10-CM | POA: Diagnosis not present

## 2021-10-06 DIAGNOSIS — I700 Atherosclerosis of aorta: Secondary | ICD-10-CM | POA: Diagnosis not present

## 2021-10-06 MED ORDER — TRULICITY 3 MG/0.5ML ~~LOC~~ SOAJ
SUBCUTANEOUS | 3 refills | Status: DC
  Filled 2021-10-06: qty 6, 84d supply, fill #0

## 2021-10-10 ENCOUNTER — Ambulatory Visit (INDEPENDENT_AMBULATORY_CARE_PROVIDER_SITE_OTHER): Payer: Medicare PPO | Admitting: Pulmonary Disease

## 2021-10-10 ENCOUNTER — Encounter: Payer: Self-pay | Admitting: Pulmonary Disease

## 2021-10-10 ENCOUNTER — Ambulatory Visit: Payer: Medicare PPO | Admitting: Pulmonary Disease

## 2021-10-10 VITALS — BP 126/64 | HR 98 | Temp 97.7°F | Ht 73.0 in | Wt 163.2 lb

## 2021-10-10 DIAGNOSIS — J849 Interstitial pulmonary disease, unspecified: Secondary | ICD-10-CM

## 2021-10-10 LAB — PULMONARY FUNCTION TEST
DL/VA % pred: 90 %
DL/VA: 3.47 ml/min/mmHg/L
DLCO cor % pred: 84 %
DLCO cor: 22.27 ml/min/mmHg
DLCO unc % pred: 84 %
DLCO unc: 22.27 ml/min/mmHg
FEF 25-75 Post: 1.93 L/sec
FEF 25-75 Pre: 1.72 L/sec
FEF2575-%Change-Post: 11 %
FEF2575-%Pred-Post: 89 %
FEF2575-%Pred-Pre: 79 %
FEV1-%Change-Post: 2 %
FEV1-%Pred-Post: 88 %
FEV1-%Pred-Pre: 86 %
FEV1-Post: 2.8 L
FEV1-Pre: 2.74 L
FEV1FVC-%Change-Post: 3 %
FEV1FVC-%Pred-Pre: 98 %
FEV6-%Change-Post: 0 %
FEV6-%Pred-Post: 92 %
FEV6-%Pred-Pre: 91 %
FEV6-Post: 3.86 L
FEV6-Pre: 3.84 L
FEV6FVC-%Change-Post: 1 %
FEV6FVC-%Pred-Post: 106 %
FEV6FVC-%Pred-Pre: 104 %
FVC-%Change-Post: 0 %
FVC-%Pred-Post: 86 %
FVC-%Pred-Pre: 87 %
FVC-Post: 3.87 L
FVC-Pre: 3.91 L
Post FEV1/FVC ratio: 72 %
Post FEV6/FVC ratio: 100 %
Pre FEV1/FVC ratio: 70 %
Pre FEV6/FVC Ratio: 98 %
RV % pred: 130 %
RV: 3.73 L
TLC % pred: 98 %
TLC: 7.53 L

## 2021-10-10 NOTE — Patient Instructions (Signed)
Full PFT Performed Today  

## 2021-10-10 NOTE — Patient Instructions (Signed)
I am glad you are stable with regard to your breathing His CT shows very minimal changes that we can just monitor Your lung function tests are normal which is good news We will schedule high-res CT and PFTs in 1 year Return to clinic in 1 year after these tests.

## 2021-10-10 NOTE — Progress Notes (Signed)
Full PFT Performed Today  

## 2021-10-10 NOTE — Addendum Note (Signed)
Addended by: Elton Sin on: 10/10/2021 10:30 AM   Modules accepted: Orders

## 2021-10-10 NOTE — Progress Notes (Signed)
Harry Zamora    [id number]    12/04/1940  Primary Care Physician:South, Annie Main, MD  Referring Physician: [name], [address] [place],  [place] [address]  Chief complaint: Follow up for interstitial lung disease  HPI: 81 year old with history of hypertension, diabetes, hyperlipidemia, prostate cancer in 2003 and he was hospitalized in December 2022 with severe sepsis secondary to community acquired pneumonia and RSV infection. Treated with ceftriaxone, azithromycin. Follow-up CTA in January showed persistent bilateral consolidation. CT also revealed an ulcer in the aorta and he has been evaluated by cardiothoracic surgery and has no indication for surgery at this point.  Overall he is feeling well with no dyspnea or cough aspirin production.  Pets: Dogs Occupation: Used to be in [employer]. Later worked as a Research officer, trade union Exposures: No mold, hot tub, Customer service manager. No further pillars or comforters. Smoking history: smoke cigars occasionally. No cigarette use Travel history: originally from [place]. No recent travel Relevant family history: no family history of lung disease  Interval history: Here for review of CT and PFTs.  States that breathing is stable without change.  Outpatient Encounter Medications as of 10/10/2021  Medication Sig   buPROPion (WELLBUTRIN SR) 150 MG 12 hr tablet Take 150 mg by mouth daily.   Calcium Carb-Cholecalciferol (CALCIUM 1000 + D PO) Take by mouth.   clopidogrel (PLAVIX) 75 MG tablet Take 1 tablet (75 mg total) by mouth daily. **Restart Plavix on 04/29/2021**   Dulaglutide (TRULICITY) 3 EX/5.2WU SOPN Inject 3 mg into the skin once a week.   Dulaglutide (TRULICITY) 3 XL/2.4MW SOPN Inject under the skin once a week as directed   Dulaglutide (TRULICITY) 3 NU/2.7OZ SOPN Inject 3 mg into the skin once a week as directed   DULoxetine (CYMBALTA) 30 MG capsule Take 30 mg by mouth daily.   Ferrous Sulfate Dried (HIGH POTENCY  IRON) 65 MG TABS Take by mouth.   lovastatin (MEVACOR) 40 MG tablet Take 40 mg by mouth daily.   meloxicam (MOBIC) 15 MG tablet Take 15 mg by mouth daily.   metFORMIN (GLUCOPHAGE) 500 MG tablet Take 2 tablets (1,000 mg total) by mouth 2 (two) times daily with a meal. Resume 10/03/2020   MULTIPLE MINERALS PO Take by mouth.   nitroGLYCERIN (NITROSTAT) 0.4 MG SL tablet Place 1 tablet (0.4 mg total) under the tongue every 5 (five) minutes as needed for chest pain.   pregabalin (LYRICA) 75 MG capsule 1 capsule Oral TID   rOPINIRole (REQUIP) 4 MG tablet Take 4 mg by mouth at bedtime.   traMADol (ULTRAM) 50 MG tablet 1 tablet Oral Once a day   vitamin B-12 (CYANOCOBALAMIN) 500 MCG tablet Take 500 mcg by mouth daily.   No facility-administered encounter medications on file as of 10/10/2021.   Physical Exam: Blood pressure 126/64, pulse 98, temperature 97.7 F (36.5 C), temperature source Oral, height '6\' 1"'$  (1.854 m), weight 163 lb 3.2 oz (74 kg), SpO2 98 %. Gen:      No acute distress HEENT:  EOMI, sclera anicteric Neck:     No masses; no thyromegaly Lungs:    Clear to auscultation bilaterally; normal respiratory effort CV:         Regular rate and rhythm; no murmurs Abd:      + bowel sounds; soft, non-tender; no palpable masses, no distension Ext:    No edema; adequate peripheral perfusion Skin:      Warm and dry; no rash  Neuro: alert and oriented x 3 Psych: normal mood and affect   Data Reviewed: Imaging: CT abdomen pelvis 08/15/19 - visualized lung bases are unclear CT angiogram 09/12/20 - no PE, minimal atelectasis at the base CT angiogram 03/18/21 - multifocal pneumonia involving right upper middle and lower lobes and left lower lobe High-resolution CT 09/02/2021-mild subpleural reticulation and groundglass opacities without honeycombing.  Probable UIP pattern I have reviewed the images personally.  PFTs: 10/10/2021 FVC 3.87 [86%], FEV1 2.80 [88%], F/F 72, TLC 7.53 [98%], DLCO 22.27  [84%] Normal test  Labs:  Assessment:  Evaluation for interstitial lung disease I have reviewed his imaging dating back to 2021. He has developed some consolidation and interstitial markings in January of this year after recent hospitalization with severe pneumonia and RSV infection. I suspect these changes are related to his resolving infection and do not think there is any underlying interstitial lung disease as previous imaging in 2021 and 2022 showed minimal atelectasis only  Follow-up high-resolution CT continues to show very minimal changes of reticulation and groundglass opacities.  I am not convinced that he has significant interstitial lung disease but we will continue to monitor with follow-up high-res CT and PFTs  Plan/Recommendations: High res CT, PFTs in 1 year.  Marshell Garfinkel MD [facility] 10/10/2021, 9:37 AM  CC: [name], MD

## 2021-10-11 ENCOUNTER — Other Ambulatory Visit (HOSPITAL_COMMUNITY): Payer: Self-pay

## 2021-10-15 ENCOUNTER — Ambulatory Visit: Payer: Medicare PPO | Admitting: Orthopaedic Surgery

## 2021-10-15 ENCOUNTER — Other Ambulatory Visit (HOSPITAL_COMMUNITY): Payer: Self-pay

## 2021-10-22 ENCOUNTER — Encounter: Payer: Self-pay | Admitting: Surgery

## 2021-10-22 ENCOUNTER — Ambulatory Visit: Payer: Medicare PPO | Admitting: Surgery

## 2021-10-22 ENCOUNTER — Ambulatory Visit
Admission: RE | Admit: 2021-10-22 | Discharge: 2021-10-22 | Disposition: A | Discharge status: Home / Self Care | Payer: Medicare PPO | Source: Ambulatory Visit | Attending: Surgery | Admitting: Surgery

## 2021-10-22 VITALS — BP 134/68 | HR 76 | Resp 18 | Ht 73.0 in | Wt 162.0 lb

## 2021-10-22 DIAGNOSIS — J984 Other disorders of lung: Secondary | ICD-10-CM | POA: Diagnosis not present

## 2021-10-22 DIAGNOSIS — I719 Aortic aneurysm of unspecified site, without rupture: Secondary | ICD-10-CM

## 2021-10-22 DIAGNOSIS — M419 Scoliosis, unspecified: Secondary | ICD-10-CM | POA: Diagnosis not present

## 2021-10-22 DIAGNOSIS — J8410 Pulmonary fibrosis, unspecified: Secondary | ICD-10-CM | POA: Diagnosis not present

## 2021-10-22 DIAGNOSIS — I2510 Atherosclerotic heart disease of native coronary artery without angina pectoris: Secondary | ICD-10-CM | POA: Diagnosis not present

## 2021-10-22 MED ORDER — IOPAMIDOL (ISOVUE-370) INJECTION 76%
75.0000 mL | Freq: Once | INTRAVENOUS | Status: AC | PRN
  Administered 2021-10-22: 75 mL via INTRAVENOUS

## 2021-10-27 DIAGNOSIS — M545 Low back pain, unspecified: Secondary | ICD-10-CM | POA: Diagnosis not present

## 2021-10-27 DIAGNOSIS — S32009K Unspecified fracture of unspecified lumbar vertebra, subsequent encounter for fracture with nonunion: Secondary | ICD-10-CM | POA: Diagnosis not present

## 2021-10-27 DIAGNOSIS — M5416 Radiculopathy, lumbar region: Secondary | ICD-10-CM | POA: Diagnosis not present

## 2021-10-27 NOTE — Progress Notes (Signed)
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  This was found incidentally on CT of the chest 09/24/2020 when he was admitted with sepsis due to community-acquired pneumonia and RSV infection.  He recovered from that and subsequently underwent lumbar spine surgery and February 2023.  Current Outpatient Medications  Medication Sig Dispense Refill   buPROPion (WELLBUTRIN SR) 150 MG 12 hr tablet Take 150 mg by mouth daily.     Calcium Carb-Cholecalciferol (CALCIUM 1000 + D PO) Take by mouth.     clopidogrel (PLAVIX) 75 MG tablet Take 1 tablet (75 mg total) by mouth daily. **Restart Plavix on 04/29/2021** 90 tablet 3   Dulaglutide (TRULICITY) 3 IZ/1.2WP SOPN Inject 3 mg into the skin once a week. 6 mL 3   DULoxetine (CYMBALTA) 30 MG capsule Take 30 mg by mouth daily.     Ferrous Sulfate Dried (HIGH POTENCY IRON) 65 MG TABS Take by mouth.     lovastatin (MEVACOR) 40 MG tablet Take 40 mg by mouth daily.     meloxicam (MOBIC) 15 MG tablet Take 15 mg by mouth daily.     metFORMIN (GLUCOPHAGE) 500 MG tablet Take 2 tablets (1,000 mg total) by mouth 2 (two) times daily with a meal. Resume 10/03/2020  1   MULTIPLE MINERALS PO Take by mouth.     pregabalin (LYRICA) 75 MG capsule 1 capsule Oral TID     rOPINIRole (REQUIP) 4 MG tablet Take 4 mg by mouth at bedtime.     traMADol (ULTRAM) 50 MG tablet 1 tablet Oral Once a day     vitamin B-12 (CYANOCOBALAMIN) 500 MCG tablet Take 500 mcg by mouth daily.     nitroGLYCERIN (NITROSTAT) 0.4 MG SL tablet Place 1 tablet (0.4 mg total) under the tongue every 5 (five) minutes as needed for chest pain. 30 tablet 3   No current facility-administered medications for this visit.     Physical Exam: BP 134/68 (BP Location: Right Arm, Patient Position: Sitting)   Pulse 76   Resp 18   Ht '6\' 1"'$  (1.854 m)   Wt 162 lb (73.5 kg)   SpO2 97% Comment: RA  BMI 21.37 kg/m  He looks well. Cardiac  exam shows regular rate and rhythm with normal heart sounds.  There is no murmur. Lungs are clear.  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   EXAM: CT ANGIOGRAPHY CHEST WITH CONTRAST   TECHNIQUE: Multidetector CT imaging of the chest was performed using the standard protocol during bolus administration of intravenous contrast. Multiplanar CT image reconstructions and MIPs were obtained to evaluate the vascular anatomy.   RADIATION DOSE REDUCTION: This exam was performed according to the departmental dose-optimization program which includes automated exposure control, adjustment of the mA and/or kV according to patient size and/or use of iterative reconstruction technique.   CONTRAST:  69m ISOVUE-370 IOPAMIDOL (ISOVUE-370) INJECTION 76%   COMPARISON:  09/01/2021 03/18/2021.   FINDINGS: Cardiovascular: No significant change in a shallow penetrating ulcer the lateral aspect of the aortic isthmus measuring 1.2 cm in maximum diameter at the base. Atheromatous calcifications, including the coronary arteries and aorta. Normal sized heart.   Mediastinum/Nodes: No enlarged mediastinal, hilar, or axillary lymph nodes. Thyroid gland, trachea, and esophagus demonstrate no significant findings.   Lungs/Pleura: Stable small bilateral lung calcified granulomata.   Upper Abdomen: Cholecystectomy clips.   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 Mildly elevated left hemidiaphragm. Mild dextroconvex thoracic scoliosis.   Review of the MIP images confirms the above findings.   IMPRESSION: 1. Stable 1.2 cm shallow penetrating ulcer arising from the lateral aspect of the aortic. 2.  Calcific coronary artery and aortic atherosclerosis.   Aortic Atherosclerosis (ICD10-I70.0).     Electronically Signed   By: Claudie Revering M.D.   On: 10/22/2021 15:45    Impression:  He has a  stable 1.2 cm penetrating ulcer of the lesser curve of the distal aortic arch.  It is unchanged compared to previous CTA of the chest in June 2022.  I do not think this requires treatment as long as it is stable in this 81 year old gentleman with multiple comorbidities.  I reviewed the CTA images with him and answered all of his questions.  I stressed the importance of continued good blood pressure control in preventing further enlargement.  Plan:  I will see him back in 1 year with a CTA of the chest.  I spent 20 minutes performing this established patient evaluation and > 50% of this time was spent face to face counseling and coordinating the care of this patient's aortic aneurysm.    Gaye Pollack, MD [facility] [phone]

## 2021-11-13 DIAGNOSIS — N1831 Chronic kidney disease, stage 3a: Secondary | ICD-10-CM | POA: Diagnosis not present

## 2021-11-13 DIAGNOSIS — I10 Essential (primary) hypertension: Secondary | ICD-10-CM | POA: Diagnosis not present

## 2021-11-13 DIAGNOSIS — I25110 Atherosclerotic heart disease of native coronary artery with unstable angina pectoris: Secondary | ICD-10-CM | POA: Diagnosis not present

## 2021-11-13 DIAGNOSIS — E1140 Type 2 diabetes mellitus with diabetic neuropathy, unspecified: Secondary | ICD-10-CM | POA: Diagnosis not present

## 2021-11-22 ENCOUNTER — Other Ambulatory Visit (HOSPITAL_COMMUNITY): Payer: Self-pay

## 2021-12-08 ENCOUNTER — Telehealth: Payer: Self-pay

## 2021-12-08 NOTE — Telephone Encounter (Signed)
Patients wife called and states that her husbands PCP wanted to know about the Metoprolol Succinate '25mg'$  . She states that it was prescribed by [facility] in may of 2023, But his provider wants to know why was it prescribed, how was it supposed to be taken , and for what reason  it was discontinued. His last OV with you was 10/02/2021.   Please advise

## 2021-12-12 ENCOUNTER — Telehealth: Payer: Self-pay

## 2021-12-12 NOTE — Telephone Encounter (Signed)
Apologize for the late reply.  I reviewed prior office visit notes.  There may have been some errors in accurate documentation of medications.  From what I could gather, most likely metoprolol was started for CAD, and discontinued after patient had had episodes of lightheadedness.  If currently not taking it, I would not resume at unless blood pressure is very high.  I called patient and his wife, both numbers went to voicemail.  Thanks MJP

## 2021-12-12 NOTE — Telephone Encounter (Signed)
I will call them 

## 2021-12-12 NOTE — Telephone Encounter (Signed)
Patients wife called again to clarify patients medication   Please look at first message from 12/08/2021  Please advise

## 2021-12-15 NOTE — Telephone Encounter (Signed)
Called pt to inform him about the message above. Pt understood

## 2021-12-24 ENCOUNTER — Other Ambulatory Visit (HOSPITAL_COMMUNITY): Payer: Self-pay

## 2021-12-24 DIAGNOSIS — E1140 Type 2 diabetes mellitus with diabetic neuropathy, unspecified: Secondary | ICD-10-CM | POA: Diagnosis not present

## 2021-12-31 ENCOUNTER — Other Ambulatory Visit (HOSPITAL_COMMUNITY): Payer: Self-pay

## 2021-12-31 DIAGNOSIS — N1831 Chronic kidney disease, stage 3a: Secondary | ICD-10-CM | POA: Diagnosis not present

## 2021-12-31 DIAGNOSIS — E1140 Type 2 diabetes mellitus with diabetic neuropathy, unspecified: Secondary | ICD-10-CM | POA: Diagnosis not present

## 2021-12-31 DIAGNOSIS — I25110 Atherosclerotic heart disease of native coronary artery with unstable angina pectoris: Secondary | ICD-10-CM | POA: Diagnosis not present

## 2021-12-31 DIAGNOSIS — I10 Essential (primary) hypertension: Secondary | ICD-10-CM | POA: Diagnosis not present

## 2021-12-31 DIAGNOSIS — K760 Fatty (change of) liver, not elsewhere classified: Secondary | ICD-10-CM | POA: Diagnosis not present

## 2021-12-31 DIAGNOSIS — Z794 Long term (current) use of insulin: Secondary | ICD-10-CM | POA: Diagnosis not present

## 2022-01-30 ENCOUNTER — Other Ambulatory Visit (HOSPITAL_COMMUNITY): Payer: Self-pay

## 2022-01-30 DIAGNOSIS — E785 Hyperlipidemia, unspecified: Secondary | ICD-10-CM | POA: Diagnosis not present

## 2022-01-30 DIAGNOSIS — E1140 Type 2 diabetes mellitus with diabetic neuropathy, unspecified: Secondary | ICD-10-CM | POA: Diagnosis not present

## 2022-01-30 DIAGNOSIS — N200 Calculus of kidney: Secondary | ICD-10-CM | POA: Diagnosis not present

## 2022-01-30 DIAGNOSIS — K760 Fatty (change of) liver, not elsewhere classified: Secondary | ICD-10-CM | POA: Diagnosis not present

## 2022-01-30 DIAGNOSIS — N1831 Chronic kidney disease, stage 3a: Secondary | ICD-10-CM | POA: Diagnosis not present

## 2022-01-30 DIAGNOSIS — I719 Aortic aneurysm of unspecified site, without rupture: Secondary | ICD-10-CM | POA: Diagnosis not present

## 2022-01-30 DIAGNOSIS — I25110 Atherosclerotic heart disease of native coronary artery with unstable angina pectoris: Secondary | ICD-10-CM | POA: Diagnosis not present

## 2022-01-30 DIAGNOSIS — G319 Degenerative disease of nervous system, unspecified: Secondary | ICD-10-CM | POA: Diagnosis not present

## 2022-01-30 DIAGNOSIS — J849 Interstitial pulmonary disease, unspecified: Secondary | ICD-10-CM | POA: Diagnosis not present

## 2022-02-26 ENCOUNTER — Other Ambulatory Visit (HOSPITAL_COMMUNITY): Payer: Self-pay

## 2022-04-02 DIAGNOSIS — I10 Essential (primary) hypertension: Secondary | ICD-10-CM | POA: Diagnosis not present

## 2022-04-02 DIAGNOSIS — N1831 Chronic kidney disease, stage 3a: Secondary | ICD-10-CM | POA: Diagnosis not present

## 2022-04-02 DIAGNOSIS — E1140 Type 2 diabetes mellitus with diabetic neuropathy, unspecified: Secondary | ICD-10-CM | POA: Diagnosis not present

## 2022-04-02 DIAGNOSIS — I25110 Atherosclerotic heart disease of native coronary artery with unstable angina pectoris: Secondary | ICD-10-CM | POA: Diagnosis not present

## 2022-04-02 DIAGNOSIS — Z794 Long term (current) use of insulin: Secondary | ICD-10-CM | POA: Diagnosis not present

## 2022-04-06 ENCOUNTER — Ambulatory Visit: Payer: Medicare HMO | Admitting: Cardiology

## 2022-04-06 ENCOUNTER — Emergency Department (HOSPITAL_BASED_OUTPATIENT_CLINIC_OR_DEPARTMENT_OTHER)
Admission: EM | Admit: 2022-04-06 | Discharge: 2022-04-06 | Disposition: A | Discharge status: Home / Self Care | Payer: Medicare HMO | Attending: Emergency Medicine | Admitting: Emergency Medicine

## 2022-04-06 ENCOUNTER — Encounter (HOSPITAL_BASED_OUTPATIENT_CLINIC_OR_DEPARTMENT_OTHER): Payer: Self-pay | Admitting: Emergency Medicine

## 2022-04-06 ENCOUNTER — Other Ambulatory Visit: Payer: Self-pay

## 2022-04-06 ENCOUNTER — Encounter: Payer: Self-pay | Admitting: Cardiology

## 2022-04-06 ENCOUNTER — Emergency Department (HOSPITAL_BASED_OUTPATIENT_CLINIC_OR_DEPARTMENT_OTHER): Payer: Medicare HMO | Admitting: Radiology

## 2022-04-06 ENCOUNTER — Emergency Department (HOSPITAL_BASED_OUTPATIENT_CLINIC_OR_DEPARTMENT_OTHER): Payer: Medicare HMO

## 2022-04-06 VITALS — BP 108/57 | HR 74 | Resp 15 | Ht 73.0 in | Wt 177.0 lb

## 2022-04-06 DIAGNOSIS — S161XXA Strain of muscle, fascia and tendon at neck level, initial encounter: Secondary | ICD-10-CM | POA: Diagnosis not present

## 2022-04-06 DIAGNOSIS — Y92410 Unspecified street and highway as the place of occurrence of the external cause: Secondary | ICD-10-CM | POA: Insufficient documentation

## 2022-04-06 DIAGNOSIS — I2510 Atherosclerotic heart disease of native coronary artery without angina pectoris: Secondary | ICD-10-CM | POA: Diagnosis not present

## 2022-04-06 DIAGNOSIS — I6782 Cerebral ischemia: Secondary | ICD-10-CM | POA: Diagnosis not present

## 2022-04-06 DIAGNOSIS — Z041 Encounter for examination and observation following transport accident: Secondary | ICD-10-CM | POA: Diagnosis not present

## 2022-04-06 DIAGNOSIS — M25512 Pain in left shoulder: Secondary | ICD-10-CM | POA: Insufficient documentation

## 2022-04-06 DIAGNOSIS — Z7902 Long term (current) use of antithrombotics/antiplatelets: Secondary | ICD-10-CM | POA: Insufficient documentation

## 2022-04-06 DIAGNOSIS — M542 Cervicalgia: Secondary | ICD-10-CM | POA: Diagnosis present

## 2022-04-06 DIAGNOSIS — S0990XA Unspecified injury of head, initial encounter: Secondary | ICD-10-CM | POA: Diagnosis not present

## 2022-04-06 DIAGNOSIS — S199XXA Unspecified injury of neck, initial encounter: Secondary | ICD-10-CM | POA: Diagnosis not present

## 2022-04-06 DIAGNOSIS — I10 Essential (primary) hypertension: Secondary | ICD-10-CM | POA: Diagnosis not present

## 2022-04-06 MED ORDER — DOXYCYCLINE HYCLATE 100 MG PO TABS
100.0000 mg | ORAL_TABLET | Freq: Once | ORAL | Status: AC
  Administered 2022-04-06: 100 mg via ORAL
  Filled 2022-04-06: qty 1

## 2022-04-06 MED ORDER — DOXYCYCLINE HYCLATE 100 MG PO CAPS: 100.0000 mg | ORAL_CAPSULE | Freq: Two times a day (BID) | ORAL | 0 refills | Status: DC
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
## 2022-04-06 NOTE — Discharge Instructions (Signed)
X-rays and CT scan are negative for serious traumatic injury.  Use Tylenol Motrin as needed for aches and pain. Follow-up with your doctor for recheck this week.  Take the antibiotic as prescribed for possible pneumonia..  Return to the ED sooner with difficulty breathing, cough, chest pain, intractable nausea and vomiting or any other concerns.

## 2022-04-06 NOTE — Progress Notes (Signed)
Patient referred by [name], MD for syncope  Subjective:   Harry Zamora, male    DOB: [birth year], 82 y.o.   MRN: [id number]   Chief Complaint  Patient presents with   Coronary Artery Disease   Follow-up    6 month     HPI  82 y.o. Caucasian male with uncontrolled type 2 diabetes mellitus, CAD, PAD, syncope, spinal stenosis s/p prior surgeries  Patient is doing well, denies chest pain, shortness of breath, palpitations, leg edema, orthopnea, PND, TIA/syncope.  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  He is very active, walks for 30 minutes most days, and works in the yard regularly.  At baseline, he does not have any symptoms of chest pain, shortness of breath, claudication with exertion.  He reports occasional episodes of shortness of breath just before going to bed.  He denies any orthopnea, PND, leg edema symptoms.  Patient with had an episode of syncope about 2 weeks ago.  He woke up at 6 AM, as usual, had his coffee.  He had not had his breakfast yet.  He was sitting at dining table.  He does not recollect having any chest pain, shortness of breath, lightheadedness symptoms.  Next thing he remembers that he was down on the floor with his wife standing next to him.  His wife called his nephew, who assisted him to sit up.  The entire episode lasted for less than a minute.  On waking up, patient did not have any confusion, loss of bowel or bladder tone, tongue bite, seizures.  Patient did not have any other symptoms rest of the day.  He has not had any recurrence of similar episodes since then.  On a separate note, patient tells me that he has had spinal surgeries in the past, and was going to undergo surgery for removal of the rods today.  However, surgery was postponed given his syncope episode, as well as uncontrolled diabetes with A1c of 9.8%.  Patient denies any claudication symptoms.  However, he has noted an area on  his second toe left foot.  This started as a area of redness which he reportedly tried to pick at.  Now the area is black and discolored.  He denies any pain at the site.  Current Outpatient Medications:    buPROPion (WELLBUTRIN SR) 150 MG 12 hr tablet, Take 150 mg by mouth daily., Disp: , Rfl:    Calcium Carb-Cholecalciferol (CALCIUM 1000 + D PO), Take by mouth., Disp: , Rfl:    clopidogrel (PLAVIX) 75 MG tablet, Take 1 tablet (75 mg total) by mouth daily. **Restart Plavix on 04/29/2021**, Disp: 90 tablet, Rfl: 3   Dulaglutide (TRULICITY) 3 VO/3.5KK SOPN, Inject 3 mg into the skin once a week., Disp: 6 mL, Rfl: 3   DULoxetine (CYMBALTA) 30 MG capsule, Take 30 mg by mouth daily., Disp: , Rfl:    Ferrous Sulfate Dried (HIGH POTENCY IRON) 65 MG TABS, Take by mouth., Disp: , Rfl:    lovastatin (MEVACOR) 40 MG tablet, Take 40 mg by mouth daily., Disp: , Rfl:    meloxicam (MOBIC) 15 MG tablet, Take 15 mg by mouth daily., Disp: , Rfl:    metFORMIN (GLUCOPHAGE) 500 MG tablet, Take 2 tablets (1,000 mg total) by mouth 2 (two) times daily with a meal. Resume 10/03/2020, Disp: , Rfl: 1   MULTIPLE MINERALS PO, Take by mouth., Disp: , Rfl:    nitroGLYCERIN (NITROSTAT) 0.4  MG SL tablet, Place 1 tablet (0.4 mg total) under the tongue every 5 (five) minutes as needed for chest pain., Disp: 30 tablet, Rfl: 3   pregabalin (LYRICA) 75 MG capsule, 1 capsule Oral TID, Disp: , Rfl:    rOPINIRole (REQUIP) 4 MG tablet, Take 4 mg by mouth at bedtime., Disp: , Rfl:    traMADol (ULTRAM) 50 MG tablet, 1 tablet Oral Once a day, Disp: , Rfl:    vitamin B-12 (CYANOCOBALAMIN) 500 MCG tablet, Take 500 mcg by mouth daily., Disp: , Rfl:   Cardiovascular and other pertinent studies:  EKG 04/06/2022: Sinus rhythm 75 bpm  Low voltage in precordial leads Anteroseptal infarct -age undetermined   CT Chest 09/01/2021: 1. Mild patchy subpleural reticulation and ground-glass opacity with associated mild traction bronchiolectasis at  both lung bases without honeycombing. An interstitial lung disease including UIP is possible. Given the previously visualized and now resolved patchy consolidation at the lung bases on the prior scan, findings may alternatively represent bland postinfectious/postinflammatory scarring. Consider follow-up high-resolution chest CT study in 12 months to assess temporal pattern stability, as clinically warranted. Findings are indeterminate for UIP per consensus guidelines: Diagnosis of Idiopathic Pulmonary Fibrosis: An Official ATS/ERS/JRS/ALAT Clinical Practice Guideline. [place], Iss 5, [phone], Nov 14 2016. 2. Trace dependent bilateral pleural effusions. 3. Small pericardial effusion/thickening, new. 4. Three-vessel coronary atherosclerosis. 5. 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 6. Aortic Atherosclerosis (ICD10-I70.0).   Carotid duplex 12/11/2020: Right Carotid: Velocities in the right ICA are consistent with a 1-39%  stenosis.  Left Carotid: Velocities in the left ICA are consistent with a 1-39%  stenosis.  Vertebrals:  Bilateral vertebral arteries demonstrate antegrade flow.  Subclavians: Normal flow hemodynamics were seen in bilateral subclavian               arteries.   Coronary intervention 10/01/2020: Successful percutaneous coronary intervention LAD and ramus with simultaneous T-stenting        PTCA and stent placement 3.0 X 38 mm Onyx Frontier drug-eluting stent Ostial-mid LAD        PTCA and stent placement 3.0 X 26 mm Resolute Onyx drug-eluting stent        Final kissing balloon angioplasty with 3.5X15 mm [place] balloon in LAD & 2.75X15 mm [place] balloon in ramus   LAD stenosis 90%-->0% Ramus stenosis 80%-->0% TIMI flow III-->III  Coronary angiography 09/17/2020: LM: Normal LAD: Long ostial to mid LAD 90% stenosis        Small high diag 1 patent        Subtotally occluded diag 2, collaterals from OM Ramus:  Medium sized vessel with mid 70% stenosis LCx: Large,dominant. Normal RCA: Small, non-dominant. Prox 60% stenosis   LVEDP normal   Obstructive CAD could possible be a cause of his syncope, although this is only speculative. He feels tired after walking for 2 miles, although denies any specific angina symptoms.  Based on his coronary angiogram findings, perioperative cardiac risk is elevated. Will discuss with Dr. Erline Levine re: urgency and nature of his upcoming spine surgery and then discuss further recommendations re: CAD  Mobile cardiac telemetry 11 days 08/28/2020 - 09/09/2020: Dominant rhythm: Sinus. HR 57-123 bpm. Avg HR 78 bpm, while in sinus rhythm. 17 episodes of SVT, fastest at 207 bpm for 13 beats, longest for 20 beats at 140 bpm. <1% isolated SVE, couplet/triplets. 1 episode of VT, at 162 bpm for 5 beats 2.2% isolated VE, <1% couplet  No atrial fibrillation/atrial flutter/high grade AV block, sinus pause >3sec noted. 5 patient triggered events, all correlated with sinus rhythm  Exercise Tetrofosmin stress test 08/28/2020: Exercise nuclear stress test was performed using Bruce protocol. Patient reached 4.6 METS, and 93% of age predicted maximum heart rate. Exercise capacity was low. No chest pain reported. Heart rate and hemodynamic response were normal. Stress EKG showed sinus tachycardia, old anteroseptal infarct, no significant ST-T changes. SPECT images show small sized, severe intensity, reversible perfusion defect in apical anterior myocardium, with associated mild hypokinesis. Stress LVEF 47%. High risk study.  Echocardiogram 08/28/2020:    Mildly depressed LV systolic function with EF 50%. Left ventricle cavity  is normal in size. Mild concentric hypertrophy of the left ventricle.  Normal global wall motion. Doppler evidence of grade I (impaired)  diastolic dysfunction, elevated LAP. Calculated EF 50%.  Left atrial cavity is mildly dilated by volume index.  The aortic  root is mildly dilated at 4.0 cm.  Lower Extremity Arterial Duplex 08/28/2020:  No hemodynamically significant stenoses are identified in the right or the  left lower extremity arterial system.  There is monophasic waveform in the bilateral AT and PT vessels and left  PT has spectral broadening suggestive of severe diffuse disease.  This exam reveals moderately decreased perfusion of the right lower  extremity, noted at the anterior tibial artery level (ABI 0.73) and  moderately decreased perfusion of the left lower extremity, noted at the  post tibial artery level (ABI 0.73).   CT lumbar spine 06/26/2020: 1. Prior L3-S1 PLIF with fracture of the left S1 anterior screw and loosening of the right S1 anterior and posterior screws. The right posterior screw traverses the right L5-S1 facet joint. No significant interbody fusion at any level. 2. Residual moderate right neuroforaminal stenosis at L5-S1. Asymmetric underfilling of the right S1 nerve root with suspected scar tissue surrounding the nerve root in the lateral recess 3. Progressive adjacent segment disease at L2-L3 with new moderate spinal canal and right neuroforaminal stenosis. 4. Nonobstructive right nephrolithiasis. 5. Aortic Atherosclerosis (ICD10-I70.0).     ABI 08/30/2019: Right: Velocities suggest 50-74% stenosis in the proximal PTA and 30-49%  stenosis in the proximal Peroneal a. Tibial vessel disease.  Left: No obvious evidence of stenosis. Tibial vessel disease.    Recent labs: 07/03/2021: Glucose 155, BUN/Cr 17/1.1. EGFR 53.  HbA1C 7.1% Chol 82, TG 147, HDL 30, LDL 23 (in 2022)   Review of Systems  Cardiovascular:  Negative for chest pain, dyspnea on exertion, leg swelling, palpitations and syncope.  Neurological:  Positive for light-headedness.         Vitals:   04/06/22 1040  BP: (!) 108/57  Pulse: 74  Resp: 15  SpO2: 99%    Body mass index is 23.35 kg/m. Filed Weights   04/06/22 1040   Weight: 177 lb (80.3 kg)     Objective:   Physical Exam Vitals and nursing note reviewed.  Constitutional:      General: He is not in acute distress. Neck:     Vascular: No JVD.  Cardiovascular:     Rate and Rhythm: Normal rate and regular rhythm.     Pulses:          Femoral pulses are 2+ on the right side and 2+ on the left side.      Popliteal pulses are 2+ on the right side and 1+ on the left side.       Dorsalis pedis pulses are 2+ on the  right side and 0 on the left side.       Posterior tibial pulses are 0 on the right side and 0 on the left side.     Heart sounds: Normal heart sounds. No murmur heard. Pulmonary:     Effort: Pulmonary effort is normal.     Breath sounds: Normal breath sounds. No wheezing or rales.  Musculoskeletal:     Right lower leg: No edema.     Left lower leg: No edema.          ICD-10-CM   1. Coronary artery disease involving native coronary artery of native heart without angina pectoris  I25.10 EKG 12-Lead       Assessment & Recommendations:   82 y.o. Caucasian male with uncontrolled type 2 diabetes mellitus, CAD, PAD, recurrent syncope, spinal stenosis s/p prior surgeries  Lightheadedness: No significant recurrence.  CAD:  Obstructive CAD with high risk anatomy, stable angina Successfully treated with LAD/ramus T stenting (10/01/2020) No angina symptoms at this time. Continue plavix, metoprolol succinate 25 mg daily. Continue rosuvastatin 40 mg daily.  PAD: B/l ABI 0.73 (08/2020) No critical limb ischemia or lifestyle limiting claudication Previously had a focal eschar on left foot 2nd toe, which could have been an atheroembolic event. Likely source could be distal aortic arahc aulcer. Now completely healed. Continue medical management and regular walking Continue plavix. Currently not taking pletal, but does not have severe claudication symptoms,.  

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
## 2022-04-10 ENCOUNTER — Telehealth: Payer: Self-pay
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
## 2022-04-13 ENCOUNTER — Telehealth: Payer: Self-pay
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
## 2022-04-14 ENCOUNTER — Telehealth: Payer: Self-pay
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
## 2022-04-15 DIAGNOSIS — E1140 Type 2 diabetes mellitus with diabetic neuropathy, unspecified: Secondary | ICD-10-CM | POA: Diagnosis not present

## 2022-04-15 DIAGNOSIS — G319 Degenerative disease of nervous system, unspecified: Secondary | ICD-10-CM | POA: Diagnosis not present

## 2022-04-15 DIAGNOSIS — I739 Peripheral vascular disease, unspecified: Secondary | ICD-10-CM | POA: Diagnosis not present

## 2022-04-15 DIAGNOSIS — E785 Hyperlipidemia, unspecified: Secondary | ICD-10-CM | POA: Diagnosis not present

## 2022-04-15 DIAGNOSIS — I771 Stricture of artery: Secondary | ICD-10-CM | POA: Diagnosis not present

## 2022-04-15 DIAGNOSIS — I25110 Atherosclerotic heart disease of native coronary artery with unstable angina pectoris: Secondary | ICD-10-CM | POA: Diagnosis not present

## 2022-04-15 DIAGNOSIS — I719 Aortic aneurysm of unspecified site, without rupture: Secondary | ICD-10-CM | POA: Diagnosis not present

## 2022-04-15 DIAGNOSIS — I700 Atherosclerosis of aorta: Secondary | ICD-10-CM | POA: Diagnosis not present

## 2022-04-15 DIAGNOSIS — N1831 Chronic kidney disease, stage 3a: Secondary | ICD-10-CM | POA: Diagnosis not present

## 2022-04-24 ENCOUNTER — Ambulatory Visit (INDEPENDENT_AMBULATORY_CARE_PROVIDER_SITE_OTHER): Payer: Medicare PPO | Admitting: Pulmonary Disease

## 2022-04-24 ENCOUNTER — Encounter: Payer: Self-pay | Admitting: Pulmonary Disease

## 2022-04-24 VITALS — BP 130/60 | HR 91 | Temp 97.9°F | Ht 73.0 in | Wt 180.2 lb

## 2022-04-24 DIAGNOSIS — J849 Interstitial pulmonary disease, unspecified: Secondary | ICD-10-CM

## 2022-04-24 LAB — PULMONARY FUNCTION TEST
DL/VA % pred: 86 %
DL/VA: 3.3 ml/min/mmHg/L
DLCO cor % pred: 78 %
DLCO cor: 20.77 ml/min/mmHg
DLCO unc % pred: 78 %
DLCO unc: 20.77 ml/min/mmHg
FEF 25-75 Post: 2.05 L/sec
FEF 25-75 Pre: 1.46 L/sec
FEF2575-%Change-Post: 40 %
FEF2575-%Pred-Post: 95 %
FEF2575-%Pred-Pre: 67 %
FEV1-%Change-Post: 8 %
FEV1-%Pred-Post: 79 %
FEV1-%Pred-Pre: 73 %
FEV1-Post: 2.53 L
FEV1-Pre: 2.33 L
FEV1FVC-%Change-Post: 2 %
FEV1FVC-%Pred-Pre: 98 %
FEV6-%Change-Post: 7 %
FEV6-%Pred-Post: 84 %
FEV6-%Pred-Pre: 78 %
FEV6-Post: 3.51 L
FEV6-Pre: 3.28 L
FEV6FVC-%Change-Post: 0 %
FEV6FVC-%Pred-Post: 106 %
FEV6FVC-%Pred-Pre: 105 %
FVC-%Change-Post: 6 %
FVC-%Pred-Post: 78 %
FVC-%Pred-Pre: 74 %
FVC-Post: 3.51 L
FVC-Pre: 3.31 L
Post FEV1/FVC ratio: 72 %
Post FEV6/FVC ratio: 100 %
Pre FEV1/FVC ratio: 70 %
Pre FEV6/FVC Ratio: 99 %
RV % pred: 111 %
RV: 3.19 L
TLC % pred: 90 %
TLC: 6.96 L

## 2022-04-24 NOTE — Progress Notes (Signed)
Full PFT Performed Today. 
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
## 2022-04-24 NOTE — Patient Instructions (Signed)
Full PFT Performed Today. 

## 2022-04-24 NOTE — Patient Instructions (Signed)
I am glad you are doing well with your breathing He can use over-the-counter Mucinex or Delsym for congestion We have a CT scan ordered for later this year Follow-up in 6 months in clinic.

## 2022-04-29 DIAGNOSIS — M5416 Radiculopathy, lumbar region: Secondary | ICD-10-CM | POA: Diagnosis not present

## 2022-04-29 DIAGNOSIS — S32009K Unspecified fracture of unspecified lumbar vertebra, subsequent encounter for fracture with nonunion: Secondary | ICD-10-CM | POA: Diagnosis not present

## 2022-04-30 ENCOUNTER — Emergency Department (HOSPITAL_BASED_OUTPATIENT_CLINIC_OR_DEPARTMENT_OTHER)
Admission: EM | Admit: 2022-04-30 | Discharge: 2022-04-30 | Disposition: A | Discharge status: Home / Self Care | Payer: Medicare PPO | Attending: Emergency Medicine | Admitting: Emergency Medicine

## 2022-04-30 ENCOUNTER — Encounter (HOSPITAL_BASED_OUTPATIENT_CLINIC_OR_DEPARTMENT_OTHER): Payer: Self-pay | Admitting: Emergency Medicine

## 2022-04-30 ENCOUNTER — Other Ambulatory Visit: Payer: Self-pay

## 2022-04-30 DIAGNOSIS — Z7902 Long term (current) use of antithrombotics/antiplatelets: Secondary | ICD-10-CM | POA: Diagnosis not present

## 2022-04-30 DIAGNOSIS — G8929 Other chronic pain: Secondary | ICD-10-CM | POA: Insufficient documentation

## 2022-04-30 DIAGNOSIS — Z794 Long term (current) use of insulin: Secondary | ICD-10-CM | POA: Diagnosis not present

## 2022-04-30 DIAGNOSIS — M25512 Pain in left shoulder: Secondary | ICD-10-CM | POA: Insufficient documentation

## 2022-04-30 DIAGNOSIS — E1140 Type 2 diabetes mellitus with diabetic neuropathy, unspecified: Secondary | ICD-10-CM | POA: Diagnosis not present

## 2022-04-30 DIAGNOSIS — I10 Essential (primary) hypertension: Secondary | ICD-10-CM | POA: Diagnosis not present

## 2022-04-30 DIAGNOSIS — K760 Fatty (change of) liver, not elsewhere classified: Secondary | ICD-10-CM | POA: Diagnosis not present

## 2022-04-30 DIAGNOSIS — I25110 Atherosclerotic heart disease of native coronary artery with unstable angina pectoris: Secondary | ICD-10-CM | POA: Diagnosis not present

## 2022-04-30 DIAGNOSIS — N1831 Chronic kidney disease, stage 3a: Secondary | ICD-10-CM | POA: Diagnosis not present

## 2022-04-30 MED ORDER — HYDROCODONE-ACETAMINOPHEN 5-325 MG PO TABS: 1.0000 | ORAL_TABLET | Freq: Three times a day (TID) | ORAL | 0 refills | Status: DC | PRN
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
## 2022-04-30 NOTE — Discharge Instructions (Signed)
You have been seen and discharged from the emergency department.  You have been prescribed pain medicine for shoulder pain.  Do not mix this medication with alcohol or other sedating medications. Do not drive or do heavy physical activity until you know how this medication affects you.  It may cause drowsiness.  Call to establish care at outpatient rehab center for physical therapy, possible acupuncture.  Follow-up with your primary provider for further evaluation and further care. Take home medications as prescribed. If you have any worsening symptoms or further concerns for your health please return to an emergency department for further evaluation.  Thank you for your service.

## 2022-05-28 ENCOUNTER — Other Ambulatory Visit (HOSPITAL_COMMUNITY): Payer: Self-pay

## 2022-05-29 ENCOUNTER — Other Ambulatory Visit (HOSPITAL_COMMUNITY): Payer: Self-pay

## 2022-05-29 MED ORDER — TRULICITY 3 MG/0.5ML ~~LOC~~ SOAJ
3.0000 mg | SUBCUTANEOUS | 3 refills | Status: DC
  Filled 2022-05-29: qty 6, 84d supply, fill #0

## 2022-06-02 ENCOUNTER — Other Ambulatory Visit (HOSPITAL_COMMUNITY): Payer: Self-pay

## 2022-06-02 MED ORDER — TRULICITY 3 MG/0.5ML ~~LOC~~ SOAJ
3.0000 mg | SUBCUTANEOUS | 3 refills | Status: DC
  Filled 2022-06-02: qty 6, 84d supply, fill #0

## 2022-06-05 DIAGNOSIS — N1831 Chronic kidney disease, stage 3a: Secondary | ICD-10-CM | POA: Diagnosis not present

## 2022-06-05 DIAGNOSIS — I25110 Atherosclerotic heart disease of native coronary artery with unstable angina pectoris: Secondary | ICD-10-CM | POA: Diagnosis not present

## 2022-06-05 DIAGNOSIS — I719 Aortic aneurysm of unspecified site, without rupture: Secondary | ICD-10-CM | POA: Diagnosis not present

## 2022-06-05 DIAGNOSIS — G319 Degenerative disease of nervous system, unspecified: Secondary | ICD-10-CM | POA: Diagnosis not present

## 2022-06-05 DIAGNOSIS — K760 Fatty (change of) liver, not elsewhere classified: Secondary | ICD-10-CM | POA: Diagnosis not present

## 2022-06-05 DIAGNOSIS — I129 Hypertensive chronic kidney disease with stage 1 through stage 4 chronic kidney disease, or unspecified chronic kidney disease: Secondary | ICD-10-CM | POA: Diagnosis not present

## 2022-06-05 DIAGNOSIS — E1140 Type 2 diabetes mellitus with diabetic neuropathy, unspecified: Secondary | ICD-10-CM | POA: Diagnosis not present

## 2022-06-05 DIAGNOSIS — Z794 Long term (current) use of insulin: Secondary | ICD-10-CM | POA: Diagnosis not present

## 2022-06-20 ENCOUNTER — Other Ambulatory Visit: Payer: Self-pay | Admitting: Cardiology

## 2022-06-20 DIAGNOSIS — I25118 Atherosclerotic heart disease of native coronary artery with other forms of angina pectoris: Secondary | ICD-10-CM

## 2022-06-25 DIAGNOSIS — E1140 Type 2 diabetes mellitus with diabetic neuropathy, unspecified: Secondary | ICD-10-CM | POA: Diagnosis not present

## 2022-06-25 DIAGNOSIS — I25110 Atherosclerotic heart disease of native coronary artery with unstable angina pectoris: Secondary | ICD-10-CM | POA: Diagnosis not present

## 2022-06-25 DIAGNOSIS — Z794 Long term (current) use of insulin: Secondary | ICD-10-CM | POA: Diagnosis not present

## 2022-06-25 DIAGNOSIS — I10 Essential (primary) hypertension: Secondary | ICD-10-CM | POA: Diagnosis not present

## 2022-06-25 DIAGNOSIS — K760 Fatty (change of) liver, not elsewhere classified: Secondary | ICD-10-CM | POA: Diagnosis not present

## 2022-06-25 DIAGNOSIS — N1831 Chronic kidney disease, stage 3a: Secondary | ICD-10-CM | POA: Diagnosis not present

## 2022-07-15 DIAGNOSIS — E1140 Type 2 diabetes mellitus with diabetic neuropathy, unspecified: Secondary | ICD-10-CM | POA: Diagnosis not present

## 2022-07-20 ENCOUNTER — Other Ambulatory Visit: Payer: Self-pay | Admitting: *Deleted

## 2022-07-20 DIAGNOSIS — I739 Peripheral vascular disease, unspecified: Secondary | ICD-10-CM

## 2022-07-20 DIAGNOSIS — I70213 Atherosclerosis of native arteries of extremities with intermittent claudication, bilateral legs: Secondary | ICD-10-CM

## 2022-07-27 ENCOUNTER — Ambulatory Visit (HOSPITAL_COMMUNITY): Admission: RE | Admit: 2022-07-27 | Payer: Medicare HMO | Source: Ambulatory Visit

## 2022-07-27 ENCOUNTER — Ambulatory Visit (HOSPITAL_COMMUNITY): Payer: Medicare HMO | Attending: Surgery

## 2022-07-27 ENCOUNTER — Ambulatory Visit: Payer: Medicare PPO

## 2022-09-13 ENCOUNTER — Emergency Department (HOSPITAL_BASED_OUTPATIENT_CLINIC_OR_DEPARTMENT_OTHER)
Admission: EM | Admit: 2022-09-13 | Discharge: 2022-09-13 | Disposition: A | Discharge status: Home / Self Care | Payer: Medicare HMO | Attending: Emergency Medicine | Admitting: Emergency Medicine

## 2022-09-13 ENCOUNTER — Emergency Department (HOSPITAL_COMMUNITY): Payer: Medicare HMO

## 2022-09-13 ENCOUNTER — Emergency Department (HOSPITAL_BASED_OUTPATIENT_CLINIC_OR_DEPARTMENT_OTHER): Payer: Medicare HMO

## 2022-09-13 ENCOUNTER — Encounter (HOSPITAL_BASED_OUTPATIENT_CLINIC_OR_DEPARTMENT_OTHER): Payer: Self-pay | Admitting: Emergency Medicine

## 2022-09-13 ENCOUNTER — Other Ambulatory Visit: Payer: Self-pay

## 2022-09-13 DIAGNOSIS — R269 Unspecified abnormalities of gait and mobility: Secondary | ICD-10-CM | POA: Diagnosis not present

## 2022-09-13 DIAGNOSIS — R569 Unspecified convulsions: Secondary | ICD-10-CM

## 2022-09-13 DIAGNOSIS — I6623 Occlusion and stenosis of bilateral posterior cerebral arteries: Secondary | ICD-10-CM | POA: Diagnosis not present

## 2022-09-13 DIAGNOSIS — R55 Syncope and collapse: Secondary | ICD-10-CM | POA: Diagnosis not present

## 2022-09-13 DIAGNOSIS — Z79899 Other long term (current) drug therapy: Secondary | ICD-10-CM | POA: Insufficient documentation

## 2022-09-13 DIAGNOSIS — E119 Type 2 diabetes mellitus without complications: Secondary | ICD-10-CM | POA: Diagnosis not present

## 2022-09-13 DIAGNOSIS — Z7984 Long term (current) use of oral hypoglycemic drugs: Secondary | ICD-10-CM | POA: Diagnosis not present

## 2022-09-13 DIAGNOSIS — I6782 Cerebral ischemia: Secondary | ICD-10-CM | POA: Diagnosis not present

## 2022-09-13 DIAGNOSIS — R791 Abnormal coagulation profile: Secondary | ICD-10-CM | POA: Diagnosis not present

## 2022-09-13 DIAGNOSIS — R42 Dizziness and giddiness: Secondary | ICD-10-CM

## 2022-09-13 DIAGNOSIS — R258 Other abnormal involuntary movements: Secondary | ICD-10-CM | POA: Insufficient documentation

## 2022-09-13 DIAGNOSIS — I672 Cerebral atherosclerosis: Secondary | ICD-10-CM | POA: Diagnosis not present

## 2022-09-13 DIAGNOSIS — R270 Ataxia, unspecified: Secondary | ICD-10-CM | POA: Diagnosis not present

## 2022-09-13 DIAGNOSIS — I2510 Atherosclerotic heart disease of native coronary artery without angina pectoris: Secondary | ICD-10-CM | POA: Insufficient documentation

## 2022-09-13 DIAGNOSIS — I6523 Occlusion and stenosis of bilateral carotid arteries: Secondary | ICD-10-CM | POA: Diagnosis not present

## 2022-09-13 DIAGNOSIS — R29818 Other symptoms and signs involving the nervous system: Secondary | ICD-10-CM | POA: Diagnosis not present

## 2022-09-13 DIAGNOSIS — I6501 Occlusion and stenosis of right vertebral artery: Secondary | ICD-10-CM | POA: Diagnosis not present

## 2022-09-13 LAB — CBC
HCT: 37.6 % — ABNORMAL LOW (ref 39.0–52.0)
Hemoglobin: 12.4 g/dL — ABNORMAL LOW (ref 13.0–17.0)
MCH: 30.7 pg (ref 26.0–34.0)
MCHC: 33 g/dL (ref 30.0–36.0)
MCV: 93.1 fL (ref 80.0–100.0)
Platelets: 155 10*3/uL (ref 150–400)
RBC: 4.04 MIL/uL — ABNORMAL LOW (ref 4.22–5.81)
RDW: 13.7 % (ref 11.5–15.5)
WBC: 6 10*3/uL (ref 4.0–10.5)
nRBC: 0 % (ref 0.0–0.2)

## 2022-09-13 LAB — BASIC METABOLIC PANEL
Anion gap: 11 (ref 5–15)
BUN: 23 mg/dL (ref 8–23)
CO2: 27 mmol/L (ref 22–32)
Calcium: 9.4 mg/dL (ref 8.9–10.3)
Chloride: 101 mmol/L (ref 98–111)
Creatinine, Ser: 1.21 mg/dL (ref 0.61–1.24)
GFR, Estimated: 59 mL/min — ABNORMAL LOW (ref >60)
Glucose, Bld: 180 mg/dL — ABNORMAL HIGH (ref 70–99)
Potassium: 4 mmol/L (ref 3.5–5.1)
Sodium: 139 mmol/L (ref 135–145)

## 2022-09-13 LAB — DIFFERENTIAL
Abs Immature Granulocytes: 0.01 10*3/uL (ref 0.00–0.07)
Basophils Absolute: 0 10*3/uL (ref 0.0–0.1)
Basophils Relative: 0 %
Eosinophils Absolute: 0.5 10*3/uL (ref 0.0–0.5)
Eosinophils Relative: 8 %
Immature Granulocytes: 0 %
Lymphocytes Relative: 30 %
Lymphs Abs: 1.8 10*3/uL (ref 0.7–4.0)
Monocytes Absolute: 0.6 10*3/uL (ref 0.1–1.0)
Monocytes Relative: 10 %
Neutro Abs: 3.1 10*3/uL (ref 1.7–7.7)
Neutrophils Relative %: 52 %

## 2022-09-13 LAB — PROTIME-INR
INR: 1 (ref 0.8–1.2)
Prothrombin Time: 13.2 seconds (ref 11.4–15.2)

## 2022-09-13 LAB — APTT: aPTT: 23 seconds — ABNORMAL LOW (ref 24–36)

## 2022-09-13 LAB — CBG MONITORING, ED: Glucose-Capillary: 167 mg/dL — ABNORMAL HIGH (ref 70–99)

## 2022-09-13 MED ORDER — IOHEXOL 350 MG/ML SOLN
100.0000 mL | Freq: Once | INTRAVENOUS | Status: AC | PRN
  Administered 2022-09-13: 75 mL via INTRAVENOUS

## 2022-09-13 MED ORDER — ASPIRIN 81 MG PO CHEW
324.0000 mg | CHEWABLE_TABLET | Freq: Once | ORAL | Status: AC
  Administered 2022-09-13: 324 mg via ORAL
  Filled 2022-09-13: qty 4

## 2022-09-13 MED ORDER — MECLIZINE HCL 25 MG PO TABS
25.0000 mg | ORAL_TABLET | Freq: Once | ORAL | Status: AC
  Administered 2022-09-13: 25 mg via ORAL
  Filled 2022-09-13: qty 1

## 2022-09-13 MED ORDER — LACTATED RINGERS IV BOLUS
1000.0000 mL | Freq: Once | INTRAVENOUS | Status: AC
  Administered 2022-09-13: 1000 mL via INTRAVENOUS

## 2022-09-13 NOTE — ED Provider Notes (Signed)
[facility] EMERGENCY DEPARTMENT AT [facility] Provider Note   CSN: [id number] Arrival date & time: 09/13/22  1150     History  No chief complaint on file.   Harry Zamora is a 82 y.o. male.  HPI    82 year old male comes in with chief complaint of dizziness/vertigo, ataxia and near fainting.  Patient has history of diabetes, CAD, PAD. He states that he was at church today and was doing well.  At the end of the charge, he was standing by his truck when he started having dizziness.  Dizziness is described as spinning sensation.  Patient felt like he was going to faint, therefore he eased himself onto the bumper of the truck.  Patient indicates that the symptoms lasted at least 45 minutes.  According to the wife, was at the bedside, patient became unresponsive for several minutes and was confused when he came to.  Patient had a blank stare, not answering questions and had to shake him, and then he was confused.  No tonic-clonic activity noted.  Patient does not recall this episode that the wife is describing.  Patient denies any chest pain, neck pain, shortness of breath, palpitations.  He does indicate that he has a frontal headache.  When he got to the emergency room, the spinning sensation had resolved, but he still felt unsteady when he walked.  Home Medications Prior to Admission medications   Medication Sig Start Date End Date Taking? Authorizing Provider  ascorbic acid (VITAMIN C) 500 MG tablet Take 500 mg by mouth daily.    [provider]  Calcium Carb-Cholecalciferol (CALCIUM 1000 + D PO) Take by mouth.    [provider]  clopidogrel (PLAVIX) 75 MG tablet Take 1 tablet (75 mg total) by mouth daily. **Restart Plavix on 04/29/2021** 04/26/21   Val Eagle D, NP  Continuous Blood Gluc Sensor (FREESTYLE LIBRE 3 SENSOR) MISC  04/13/22   [provider]  Dulaglutide (TRULICITY) 3 MG/0.5ML SOPN Inject 3 mg into the skin once a week. 05/29/22      Dulaglutide (TRULICITY) 3 MG/0.5ML SOPN Inject 3 mg into the skin once a week. 06/02/22     DULoxetine (CYMBALTA) 30 MG capsule Take 30 mg by mouth daily. 06/05/21   [provider]  Ferrous Sulfate Dried (HIGH POTENCY IRON) 65 MG TABS Take by mouth.    [provider]  gabapentin (NEURONTIN) 100 MG capsule Take 100 mg by mouth at bedtime. 10/09/14   [provider]  HYDROcodone-acetaminophen (NORCO) 5-325 MG tablet Take 1 tablet by mouth every 8 (eight) hours as needed for moderate pain. 04/30/22   Horton, Danford Bad M, DO  lovastatin (MEVACOR) 40 MG tablet Take 40 mg by mouth daily. 05/05/21   [provider]  meloxicam (MOBIC) 15 MG tablet Take 15 mg by mouth daily.    [provider]  metFORMIN (GLUCOPHAGE) 500 MG tablet Take 2 tablets (1,000 mg total) by mouth 2 (two) times daily with a meal. Resume 10/03/2020 Patient taking differently: Take 500 mg by mouth 2 (two) times daily with a meal. Resume 10/03/2020 10/03/20   Patwardhan, Anabel Bene, MD  metoprolol succinate (TOPROL-XL) 25 MG 24 hr tablet Take 25 mg by mouth daily. 04/28/22   [provider]  MULTIPLE MINERALS PO Take by mouth.    [provider]  nitroGLYCERIN (NITROSTAT) 0.4 MG SL tablet Place 1 tablet (0.4 mg total) under the tongue every 5 (five) minutes as needed for chest pain. 09/02/20 04/06/22  Patwardhan, Manish J, MD  pregabalin (LYRICA) 75 MG capsule 1 capsule Oral TID    [provider]  rOPINIRole (REQUIP) 4 MG tablet Take 4 mg by mouth at bedtime.    [provider]  rosuvastatin (CRESTOR) 40 MG tablet TAKE 1 TABLET(40 MG) BY MOUTH DAILY 06/25/22   Patwardhan, Anabel Bene, MD  traMADol (ULTRAM) 50 MG tablet 1 tablet Oral Once a day    [provider]  TRESIBA FLEXTOUCH 100 UNIT/ML FlexTouch Pen daily.    [provider]  vitamin B-12 (CYANOCOBALAMIN) 500 MCG tablet Take 500 mcg by mouth daily.    [provider]      Allergies     Atorvastatin, Levaquin [levofloxacin in d5w], Niaspan [niacin er], and Tramadol    Review of Systems   Review of Systems  All other systems reviewed and are negative.   Physical Exam Updated Vital Signs BP (!) 168/81   Pulse 68   Temp 98.1 F (36.7 C) (Oral)   Resp 16   SpO2 99%  Physical Exam Vitals and nursing note reviewed.  Constitutional:      Appearance: He is well-developed.  HENT:     Head: Normocephalic and atraumatic.  Eyes:     Extraocular Movements: Extraocular movements intact.     Pupils: Pupils are equal, round, and reactive to light.     Comments: Patient has bilateral horizontal nystagmus, peripheral visual fields intact  Neck:     Vascular: No JVD.  Cardiovascular:     Rate and Rhythm: Normal rate and regular rhythm.  Pulmonary:     Effort: Pulmonary effort is normal. No respiratory distress.     Breath sounds: Normal breath sounds. No wheezing.  Abdominal:     General: Bowel sounds are normal. There is no distension.     Palpations: Abdomen is soft.     Tenderness: There is no abdominal tenderness.  Musculoskeletal:     Cervical back: Normal range of motion and neck supple.  Skin:    General: Skin is warm and dry.  Neurological:     Mental Status: He is alert and oriented to person, place, and time.     Cranial Nerves: No cranial nerve deficit.     Sensory: No sensory deficit.     Motor: No weakness.     Coordination: Coordination normal.     Gait: Gait abnormal.     Comments: NIHSS - 0 No objective sensory deficits, Motor strength upper and lower extremity 4+ and equal Normal cerebellar exam -normal finger-to-nose     ED Results / Procedures / Treatments   Labs (all labs ordered are listed, but only abnormal results are displayed) Labs Reviewed  BASIC METABOLIC PANEL - Abnormal; Notable for the following components:      Result Value   Glucose, Bld 180 (*)    GFR, Estimated 59 (*)    All other components within normal limits  APTT -  Abnormal; Notable for the following components:   aPTT 23 (*)    All other components within normal limits  CBC - Abnormal; Notable for the following components:   RBC 4.04 (*)    Hemoglobin 12.4 (*)    HCT 37.6 (*)    All other components within normal limits  CBG MONITORING, ED - Abnormal; Notable for the following components:   Glucose-Capillary 167 (*)    All other components within normal limits  PROTIME-INR  DIFFERENTIAL  URINALYSIS, ROUTINE W REFLEX MICROSCOPIC    EKG  EKG Interpretation Date/Time:  Sunday September 13 2022 12:03:19 EDT Ventricular Rate:  69 PR Interval:  185 QRS Duration:  90 QT Interval:  411 QTC Calculation: 441 R Axis:   75  Text Interpretation: Sinus rhythm No acute changes No significant change since last tracing Confirmed by Derwood Kaplan [id number] on 09/13/2022 1:05:55 PM  Radiology CT ANGIO HEAD NECK W WO CM  Result Date: 09/13/2022 CLINICAL DATA:  Ataxia. Sudden onset of dizziness, weakness, headache, and blurry vision. EXAM: CT ANGIOGRAPHY HEAD AND NECK WITH AND WITHOUT CONTRAST TECHNIQUE: Multidetector CT imaging of the head and neck was performed using the standard protocol during bolus administration of intravenous contrast. Multiplanar CT image reconstructions and MIPs were obtained to evaluate the vascular anatomy. Carotid stenosis measurements (when applicable) are obtained utilizing NASCET criteria, using the distal internal carotid diameter as the denominator. RADIATION DOSE REDUCTION: This exam was performed according to the departmental dose-optimization program which includes automated exposure control, adjustment of the mA and/or kV according to patient size and/or use of iterative reconstruction technique. CONTRAST:  75mL OMNIPAQUE IOHEXOL 350 MG/ML SOLN COMPARISON:  Head CT 04/06/2022 FINDINGS: CT HEAD FINDINGS Brain: There is no evidence of an acute infarct, intracranial hemorrhage, mass, midline shift, or extra-axial fluid collection. Mild  cerebral atrophy is within normal limits for age. Cerebral white matter hypodensities are unchanged and nonspecific but compatible with mild chronic small vessel ischemic disease. Vascular: Calcified atherosclerosis at the skull base. Skull: No acute fracture or suspicious osseous lesion. Sinuses/Orbits: Paranasal sinuses and mastoid air cells are clear. Bilateral cataract extraction. Other: None. Review of the MIP images confirms the above findings CTA NECK FINDINGS Aortic arch: Standard 3 vessel aortic arch with a a moderate amount of predominantly calcified atherosclerotic plaque. No significant stenosis of the arch vessel origins. Right carotid system: Patent with a small amount of calcified plaque at the carotid bifurcation and in the distal cervical ICA. No evidence of a significant stenosis or dissection. Left carotid system: Patent with a small to moderate amount of mixed calcified and soft plaque at the carotid bifurcation. No evidence of a significant stenosis or dissection. Vertebral arteries: Patent with the right being strongly dominant. Calcified plaque at the right vertebral origin results in mild stenosis. The left vertebral artery is diffusely hypoplastic without evidence of a significant stenosis within limitations of small vessel size and prominent regional venous contrast. Skeleton: Advanced upper cervical facet arthrosis. Left facet ankylosis at C2-3. Other neck: No evidence of cervical lymphadenopathy or mass. Upper chest: No mass or consolidation in the included lung apices. Review of the MIP images confirms the above findings CTA HEAD FINDINGS Anterior circulation: The internal carotid arteries are patent from skull base to carotid termini with extensive calcific atherosclerosis of the carotid siphons. There are mild bilateral cavernous stenoses and moderate to severe bilateral proximal supraclinoid stenoses. ACAs and MCAs are patent with branch vessel irregularity but no evidence of a  proximal branch occlusion or flow limiting A1 or M1 stenosis. No aneurysm is identified. Posterior circulation: The intracranial vertebral arteries are patent to the basilar with calcified plaque resulting in mild right V4 stenosis. The left vertebral artery is particularly diminutive distal to the PICA origin. The basilar artery is widely patent. Posterior communicating arteries are diminutive or absent. There is a severe stenosis of the right PCA near the P1-P2 junction with the PCA being diffusely small distal to this. There is also a severe proximal left P2 stenosis. No aneurysm is identified. Venous sinuses: Patent.  Anatomic variants: Strongly dominant right vertebral artery. Review of the MIP images confirms the above findings IMPRESSION: 1. No evidence of acute intracranial abnormality. 2. No large vessel occlusion. 3. 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 4. Cervical carotid artery atherosclerosis without significant stenosis. 5. Mild right vertebral artery origin stenosis. 6.  Aortic Atherosclerosis (ICD10-I70.0). Electronically Signed   By: Sebastian Ache M.D.   On: 09/13/2022 14:13    Procedures Procedures    Medications Ordered in ED Medications  lactated ringers bolus 1,000 mL (0 mLs Intravenous Stopped 09/13/22 1442)  iohexol (OMNIPAQUE) 350 MG/ML injection 100 mL (75 mLs Intravenous Contrast Given 09/13/22 1337)  aspirin chewable tablet 324 mg (324 mg Oral Given 09/13/22 1449)  meclizine (ANTIVERT) tablet 25 mg (25 mg Oral Given 09/13/22 1449)    ED Course/ Medical Decision Making/ A&P       ABCD2 Score: 4                     Medical Decision Making Amount and/or Complexity of Data Reviewed Labs: ordered. Radiology: ordered.  Risk OTC drugs. Prescription drug management.   This patient presents to the ED with chief complaint(s) of dizziness/vertigo, ataxia and near fainting and an  episode of blank staring with pertinent past medical history of CAD, PAD, diabetes.The complaint involves an extensive differential diagnosis and also carries with it a high risk of complications and morbidity.    The differential diagnosis includes: TIA, absence seizure, ischemic stroke, orthostatic dizziness, severe electrolyte abnormality  The initial plan is to get basic labs, CT angiogram head and neck.  Additional history obtained: Additional history obtained from spouse who provides substantial history.  Independent labs interpretation:  The following labs were independently interpreted: CBC, BMP are reassuring  Independent visualization and interpretation of imaging: - I independently visualized the following imaging with scope of interpretation limited to determining acute life threatening conditions related to emergency care: CT scan of the brain, which revealed no evidence of brain bleed. CT angiogram does not reveal significant stenosis.  Treatment and Reassessment: Results discussed with the patient.  I walked the patient, he was still slightly ataxic.  Neuroexam otherwise unchanged, patient does not have any vertigo at rest.  Consultation: - Consulted or discussed management/test interpretation with external professional: Neurology.  I spoke with Dr. Wilford Corner -and we discussed patient's vertigo with near fainting and episode of blank stare.  He recommended that we get CT angiogram head and neck.  If negative patient can be transferred to [facility] for MRI of the brain.  If MRI is negative then patient can be discharged with outpatient neurology referral for the blank stare and ?  TIA.  He is comfortable with initiation of antiplatelet agents by outpatient team.  Patient comfortable with the plan of going to [facility].  He will go PO[facility].  Dr. Rubin Payor made aware at [facility] and is excepting.   Final Clinical Impression(s) / ED Diagnoses Final diagnoses:  Ataxia  Vertigo   Seizure-like activity (HCC)    Rx / DC Orders ED Discharge Orders          Ordered    Ambulatory referral to Neurology       Comments: An appointment is requested in approximately: 2 weeks   09/13/22 1432              Derwood Kaplan, MD 09/13/22 1512

## 2022-09-13 NOTE — ED Notes (Signed)
Report given to Dr. Rhunette Croft

## 2022-09-13 NOTE — ED Notes (Signed)
Pt aware for need of urine sample. Urinal at bedside and instructed pt to press call bell when he is able to provide sample.

## 2022-09-13 NOTE — ED Triage Notes (Signed)
Sudden onset of dizziness/weakness/headache /vision blurry. Resolved on the way over here. This happened about 20 minutes ago. Pt states little dizzy, feel weak, headache is little better.

## 2022-09-13 NOTE — ED Provider Notes (Cosign Needed Addendum)
  Physical Exam  BP (!) 151/83 (BP Location: Right Arm)   Pulse 66   Temp (!) 97.5 F (36.4 C) (Axillary)   Resp 16   SpO2 96%   Physical Exam Vitals and nursing note reviewed.  Constitutional:      General: He is not in acute distress.    Appearance: He is not ill-appearing or toxic-appearing.  HENT:     Head: Normocephalic and atraumatic.     Nose: Nose normal.     Mouth/Throat:     Mouth: Mucous membranes are moist.     Pharynx: Oropharynx is clear.  Eyes:     Extraocular Movements: Extraocular movements intact.     Conjunctiva/sclera: Conjunctivae normal.     Pupils: Pupils are equal, round, and reactive to light.  Abdominal:     General: Abdomen is flat. Bowel sounds are normal.     Palpations: Abdomen is soft.  Neurological:     General: No focal deficit present.     Mental Status: He is alert.     GCS: GCS eye subscore is 4. GCS verbal subscore is 5. GCS motor subscore is 6.     Cranial Nerves: Cranial nerves 2-12 are intact.     Sensory: Sensation is intact. No sensory deficit.     Motor: Motor function is intact. No weakness.     Procedures  Procedures  ED Course / MDM    Medical Decision Making Amount and/or Complexity of Data Reviewed Labs: ordered. Radiology: ordered.  Risk OTC drugs. Prescription drug management.   Patient sent over from [facility].  Please see Dr. Brandy Hale note for details of original interaction with patient.  In short this is an 82 year old male who presented to an outside facility this morning for complaint of dizziness.  The patient was at church this morning when he had a sudden onset of dizziness described as "everything spinning" that lasted for 45 minutes.  He went to [facility] where his dizziness subsided.  The patient was seen by Dr. Rhunette Croft, had unremarkable CT angiogram head and neck performed in was advised by neurology to have MRI of brain performed.  He was sent to [facility] for this imaging  study.  On my examination the patient is only complaining of dizziness.  He states that initially his dizziness was 10 out of 10 but on my examination it is currently 5 out of 10.  He denies headache, nausea, vomiting, one-sided weakness or numbness.  Patient MRI head negative.  The patient will be discharged home at this time per Dr. Brandy Hale plan.  The patient is comfortable with this plan.  The patient will return to the ED with any new or worsening symptoms. [facility] will follow up with the patient. Dr. Rhunette Croft placed ambulatory referral. Patient given opportunity to ask questions and all questions answered to the patient satisfaction.  The patient is comfortable being discharged.  Patient discharged at this time in stable condition.         Al Decant, PA-C 09/13/22 1810    Gwyneth Sprout, MD 09/16/22 [phone]

## 2022-09-13 NOTE — Discharge Instructions (Addendum)
Please go to [facility] for additional workup.  If the MRI is negative, then you will need to follow-up with neurology.  We have placed outpatient referral to [facility] neurology for that. They will call you soon to set up appointment. If they do not, reach out to them with number provided.  [place] law prevents people with seizures or fainting from driving or operating dangerous machinery until they are free of seizures or fainting for 6 months.

## 2022-09-13 NOTE — ED Notes (Signed)
Patient and wife left [facility] at this time en route to [facility] for MRI.

## 2022-09-21 ENCOUNTER — Other Ambulatory Visit: Payer: Self-pay | Admitting: Surgery

## 2022-09-21 DIAGNOSIS — I719 Aortic aneurysm of unspecified site, without rupture: Secondary | ICD-10-CM

## 2022-09-24 DIAGNOSIS — E1140 Type 2 diabetes mellitus with diabetic neuropathy, unspecified: Secondary | ICD-10-CM | POA: Diagnosis not present

## 2022-09-24 DIAGNOSIS — I25110 Atherosclerotic heart disease of native coronary artery with unstable angina pectoris: Secondary | ICD-10-CM | POA: Diagnosis not present

## 2022-09-24 DIAGNOSIS — I700 Atherosclerosis of aorta: Secondary | ICD-10-CM | POA: Diagnosis not present

## 2022-09-24 DIAGNOSIS — Z794 Long term (current) use of insulin: Secondary | ICD-10-CM | POA: Diagnosis not present

## 2022-09-24 DIAGNOSIS — I129 Hypertensive chronic kidney disease with stage 1 through stage 4 chronic kidney disease, or unspecified chronic kidney disease: Secondary | ICD-10-CM | POA: Diagnosis not present

## 2022-09-24 DIAGNOSIS — N1831 Chronic kidney disease, stage 3a: Secondary | ICD-10-CM | POA: Diagnosis not present

## 2022-09-24 DIAGNOSIS — F331 Major depressive disorder, recurrent, moderate: Secondary | ICD-10-CM | POA: Diagnosis not present

## 2022-09-24 DIAGNOSIS — R296 Repeated falls: Secondary | ICD-10-CM | POA: Diagnosis not present

## 2022-09-24 DIAGNOSIS — J849 Interstitial pulmonary disease, unspecified: Secondary | ICD-10-CM | POA: Diagnosis not present

## 2022-09-25 ENCOUNTER — Other Ambulatory Visit: Payer: Medicare HMO

## 2022-09-28 ENCOUNTER — Encounter: Payer: Self-pay | Admitting: Surgery

## 2022-09-29 ENCOUNTER — Encounter: Payer: Self-pay | Admitting: Pulmonary Disease

## 2022-10-01 ENCOUNTER — Ambulatory Visit: Payer: Medicare HMO | Admitting: Surgery

## 2022-10-05 ENCOUNTER — Other Ambulatory Visit: Payer: Medicare HMO

## 2022-10-05 ENCOUNTER — Encounter: Payer: Self-pay | Admitting: Cardiology

## 2022-10-05 ENCOUNTER — Ambulatory Visit: Payer: Medicare HMO | Admitting: Cardiology

## 2022-10-05 VITALS — BP 128/66 | HR 68 | Resp 16 | Ht 73.0 in | Wt 179.0 lb

## 2022-10-05 DIAGNOSIS — R55 Syncope and collapse: Secondary | ICD-10-CM | POA: Diagnosis not present

## 2022-10-05 DIAGNOSIS — R42 Dizziness and giddiness: Secondary | ICD-10-CM

## 2022-10-05 DIAGNOSIS — I2510 Atherosclerotic heart disease of native coronary artery without angina pectoris: Secondary | ICD-10-CM | POA: Diagnosis not present

## 2022-10-05 DIAGNOSIS — I739 Peripheral vascular disease, unspecified: Secondary | ICD-10-CM | POA: Diagnosis not present

## 2022-10-05 NOTE — Progress Notes (Signed)
Patient referred by Adrian Prince, MD for syncope  Subjective:   Harry Zamora, male    DOB: 02-24-1941, 82 y.o.   MRN: [id number]   Chief Complaint  Patient presents with   Coronary Artery Disease   Follow-up    6 month     HPI  82 y.o. Caucasian male with uncontrolled type 2 diabetes mellitus, CAD, PAD, syncope, spinal stenosis s/p prior surgeries  Patient was seen in [facility] emergency room on 09/13/2022 with complaints of dizziness.  MRI brain was negative for any acute stroke.  He was recommended follow-up with [facility] neurology. Patient continues to have episodes of dizziness and generalized shaking that have led to frequent falls but no loss of consciousness. He has appt with Neurology in 11/2022.  Initial consultation HPI 07/2020:  Patient is a Cytogeneticist, retired Nurse, adult and a IT sales professional.  He is very active, walks for 30 minutes most days, and works in the yard regularly.  At baseline, he does not have any symptoms of chest pain, shortness of breath, claudication with exertion.  He reports occasional episodes of shortness of breath just before going to bed.  He denies any orthopnea, PND, leg edema symptoms.  Patient with had an episode of syncope about 2 weeks ago.  He woke up at 6 AM, as usual, had his coffee.  He had not had his breakfast yet.  He was sitting at dining table.  He does not recollect having any chest pain, shortness of breath, lightheadedness symptoms.  Next thing he remembers that he was down on the floor with his wife standing next to him.  His wife called his nephew, who assisted him to sit up.  The entire episode lasted for less than a minute.  On waking up, patient did not have any confusion, loss of bowel or bladder tone, tongue bite, seizures.  Patient did not have any other symptoms rest of the day.  He has not had any recurrence of similar episodes since then.  On a separate note, patient tells me that he has had spinal surgeries in the past, and was  going to undergo surgery for removal of the rods today.  However, surgery was postponed given his syncope episode, as well as uncontrolled diabetes with A1c of 9.8%.  Patient denies any claudication symptoms.  However, he has noted an area on his second toe left foot.  This started as a area of redness which he reportedly tried to pick at.  Now the area is black and discolored.  He denies any pain at the site.  Current Outpatient Medications:    ascorbic acid (VITAMIN C) 500 MG tablet, Take 500 mg by mouth daily., Disp: , Rfl:    Calcium Carb-Cholecalciferol (CALCIUM 1000 + D PO), Take by mouth., Disp: , Rfl:    clopidogrel (PLAVIX) 75 MG tablet, Take 1 tablet (75 mg total) by mouth daily. **Restart Plavix on 04/29/2021**, Disp: 90 tablet, Rfl: 3   Continuous Blood Gluc Sensor (FREESTYLE LIBRE 3 SENSOR) MISC, , Disp: , Rfl:    Dulaglutide (TRULICITY) 3 MG/0.5ML SOPN, Inject 3 mg into the skin once a week., Disp: 6 mL, Rfl: 3   Dulaglutide (TRULICITY) 3 MG/0.5ML SOPN, Inject 3 mg into the skin once a week., Disp: 33 mL, Rfl: 3   DULoxetine (CYMBALTA) 30 MG capsule, Take 30 mg by mouth daily., Disp: , Rfl:    Ferrous Sulfate Dried (HIGH POTENCY IRON) 65 MG TABS, Take by mouth., Disp: , Rfl:  gabapentin (NEURONTIN) 100 MG capsule, Take 100 mg by mouth at bedtime., Disp: , Rfl:    HYDROcodone-acetaminophen (NORCO) 5-325 MG tablet, Take 1 tablet by mouth every 8 (eight) hours as needed for moderate pain., Disp: 10 tablet, Rfl: 0   meloxicam (MOBIC) 15 MG tablet, Take 15 mg by mouth daily., Disp: , Rfl:    metFORMIN (GLUCOPHAGE) 500 MG tablet, Take 2 tablets (1,000 mg total) by mouth 2 (two) times daily with a meal. Resume 10/03/2020 (Patient taking differently: Take 500 mg by mouth 2 (two) times daily with a meal. Resume 10/03/2020), Disp: , Rfl: 1   metoprolol succinate (TOPROL-XL) 25 MG 24 hr tablet, Take 25 mg by mouth daily., Disp: , Rfl:    MULTIPLE MINERALS PO, Take by mouth., Disp: , Rfl:     nitroGLYCERIN (NITROSTAT) 0.4 MG SL tablet, Place 1 tablet (0.4 mg total) under the tongue every 5 (five) minutes as needed for chest pain., Disp: 30 tablet, Rfl: 3   pregabalin (LYRICA) 75 MG capsule, 1 capsule Oral TID, Disp: , Rfl:    rOPINIRole (REQUIP) 4 MG tablet, Take 4 mg by mouth at bedtime., Disp: , Rfl:    Semaglutide, 1 MG/DOSE, (OZEMPIC, 1 MG/DOSE,) 4 MG/3ML SOPN, inject 1mg  Subcutaneous weekly, Disp: , Rfl:    traMADol (ULTRAM) 50 MG tablet, 1 tablet Oral Once a day, Disp: , Rfl:    TRESIBA FLEXTOUCH 100 UNIT/ML FlexTouch Pen, daily., Disp: , Rfl:    vitamin B-12 (CYANOCOBALAMIN) 500 MCG tablet, Take 500 mcg by mouth daily., Disp: , Rfl:    lovastatin (MEVACOR) 40 MG tablet, Take 40 mg by mouth daily., Disp: , Rfl:    rosuvastatin (CRESTOR) 40 MG tablet, TAKE 1 TABLET(40 MG) BY MOUTH DAILY, Disp: 90 tablet, Rfl: 3  Cardiovascular and other pertinent studies:  EKG 10/05/2022: Sinus rhythm 68 bpm Low voltage in limb leads Anteroseptal infarct -age undetermined  MRI brain 09/13/2022: 1. Motion degraded examination without evidence of acute intracranial abnormality. 2. Mild chronic small vessel ischemic disease.   CTA head/neck 09/13/2022: 1. No evidence of acute intracranial abnormality. 2. No large vessel occlusion. 3. 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 4. Cervical carotid artery atherosclerosis without significant stenosis. 5. Mild right vertebral artery origin stenosis. 6.  Aortic Atherosclerosis (ICD10-I70.0).    CT Chest 09/01/2021: 1. Mild patchy subpleural reticulation and ground-glass opacity with associated mild traction bronchiolectasis at both lung bases without honeycombing. An interstitial lung disease including UIP is possible. Given the previously visualized and now resolved patchy consolidation at the lung bases on the prior scan, findings may alternatively  represent bland postinfectious/postinflammatory scarring. Consider follow-up high-resolution chest CT study in 12 months to assess temporal pattern stability, as clinically warranted. Findings are indeterminate for UIP per consensus guidelines: Diagnosis of Idiopathic Pulmonary Fibrosis: An Official ATS/ERS/JRS/ALAT Clinical Practice Guideline. Am Rosezetta Schlatter Crit Care Med Vol 198, Iss 5, [phone], Nov 14 2016. 2. Trace dependent bilateral pleural effusions. 3. Small pericardial effusion/thickening, new. 4. Three-vessel coronary atherosclerosis. 5. 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 6. Aortic Atherosclerosis (ICD10-I70.0).   Carotid duplex 12/11/2020: Right Carotid: Velocities in the right ICA are consistent with a 1-39%  stenosis.  Left Carotid: Velocities in the left ICA are consistent with a 1-39%  stenosis.  Vertebrals:  Bilateral vertebral arteries demonstrate antegrade flow.  Subclavians: Normal flow hemodynamics were seen in bilateral subclavian  arteries.   Coronary intervention 10/01/2020: Successful percutaneous coronary intervention LAD and ramus with simultaneous T-stenting        PTCA and stent placement 3.0 X 38 mm Onyx Frontier drug-eluting stent Ostial-mid LAD        PTCA and stent placement 3.0 X 26 mm Resolute Onyx drug-eluting stent        Final kissing balloon angioplasty with 3.5X15 mm [place] balloon in LAD & 2.75X15 mm [place] balloon in ramus   LAD stenosis 90%-->0% Ramus stenosis 80%-->0% TIMI flow III-->III  Coronary angiography 09/17/2020: LM: Normal LAD: Long ostial to mid LAD 90% stenosis        Small high diag 1 patent        Subtotally occluded diag 2, collaterals from OM Ramus: Medium sized vessel with mid 70% stenosis LCx: Large,dominant. Normal RCA: Small, non-dominant. Prox 60% stenosis   LVEDP normal   Obstructive CAD could possible be a cause of his syncope, although this is only speculative. He  feels tired after walking for 2 miles, although denies any specific angina symptoms.  Based on his coronary angiogram findings, perioperative cardiac risk is elevated. Will discuss with Dr. Maeola Harman re: urgency and nature of his upcoming spine surgery and then discuss further recommendations re: CAD  Mobile cardiac telemetry 11 days 08/28/2020 - 09/09/2020: Dominant rhythm: Sinus. HR 57-123 bpm. Avg HR 78 bpm, while in sinus rhythm. 17 episodes of SVT, fastest at 207 bpm for 13 beats, longest for 20 beats at 140 bpm. <1% isolated SVE, couplet/triplets. 1 episode of VT, at 162 bpm for 5 beats 2.2% isolated VE, <1% couplet No atrial fibrillation/atrial flutter/high grade AV block, sinus pause >3sec noted. 5 patient triggered events, all correlated with sinus rhythm  Exercise Tetrofosmin stress test 08/28/2020: Exercise nuclear stress test was performed using Bruce protocol. Patient reached 4.6 METS, and 93% of age predicted maximum heart rate. Exercise capacity was low. No chest pain reported. Heart rate and hemodynamic response were normal. Stress EKG showed sinus tachycardia, old anteroseptal infarct, no significant ST-T changes. SPECT images show small sized, severe intensity, reversible perfusion defect in apical anterior myocardium, with associated mild hypokinesis. Stress LVEF 47%. High risk study.  Echocardiogram 08/28/2020:    Mildly depressed LV systolic function with EF 50%. Left ventricle cavity  is normal in size. Mild concentric hypertrophy of the left ventricle.  Normal global wall motion. Doppler evidence of grade I (impaired)  diastolic dysfunction, elevated LAP. Calculated EF 50%.  Left atrial cavity is mildly dilated by volume index.  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  Lower Extremity Arterial Duplex 08/28/2020:  No hemodynamically significant stenoses are identified in the right or the  left lower extremity arterial system.  There is monophasic waveform in the  bilateral AT and PT vessels and left  PT has spectral broadening suggestive of severe diffuse disease.  This exam reveals moderately decreased perfusion of the right lower  extremity, noted at the anterior tibial artery level (ABI 0.73) and  moderately decreased perfusion of the left lower extremity, noted at the  post tibial artery level (ABI 0.73).   CT lumbar spine 06/26/2020: 1. Prior L3-S1 PLIF with fracture of the left S1 anterior screw and loosening of the right S1 anterior and posterior screws. The right posterior screw traverses the right L5-S1 facet joint. No significant interbody fusion at any level. 2. Residual moderate right neuroforaminal stenosis at L5-S1. Asymmetric underfilling of the right S1 nerve root with suspected scar  tissue surrounding the nerve root in the lateral recess 3. Progressive adjacent segment disease at L2-L3 with new moderate spinal canal and right neuroforaminal stenosis. 4. Nonobstructive right nephrolithiasis. 5. Aortic Atherosclerosis (ICD10-I70.0).     ABI 08/30/2019: Right: Velocities suggest 50-74% stenosis in the proximal PTA and 30-49%  stenosis in the proximal Peroneal a. Tibial vessel disease.  Left: No obvious evidence of stenosis. Tibial vessel disease.    Recent labs: 09/13/2022: Glucose 180, BUN/Cr 23/1.21. EGFR 59. Na/K 139/4.0.  H/H 12/37. MCV 93. Platelets 155  07/03/2021: Glucose 155, BUN/Cr 17/1.1. EGFR 53.  HbA1C 7.1% Chol 82, TG 147, HDL 30, LDL 23 (in 2022)   Review of Systems  Cardiovascular:  Negative for chest pain, dyspnea on exertion, leg swelling, palpitations and syncope.  Neurological:  Positive for dizziness and light-headedness.         Vitals:   10/05/22 0946  BP: 128/66  Pulse: 68  Resp: 16  SpO2: 96%    Body mass index is 23.62 kg/m. Filed Weights   10/05/22 0946  Weight: 179 lb (81.2 kg)     Objective:   Physical Exam Vitals and nursing note reviewed.  Constitutional:      General:  He is not in acute distress. Neck:     Vascular: No JVD.  Cardiovascular:     Rate and Rhythm: Normal rate and regular rhythm.     Pulses:          Femoral pulses are 2+ on the right side and 2+ on the left side.      Popliteal pulses are 2+ on the right side and 1+ on the left side.       Dorsalis pedis pulses are 2+ on the right side and 0 on the left side.       Posterior tibial pulses are 0 on the right side and 0 on the left side.     Heart sounds: Normal heart sounds. No murmur heard. Pulmonary:     Effort: Pulmonary effort is normal.     Breath sounds: Normal breath sounds. No wheezing or rales.  Musculoskeletal:     Right lower leg: No edema.     Left lower leg: No edema.          ICD-10-CM   1. Coronary artery disease involving native coronary artery of native heart without angina pectoris  I25.10 EKG 12-Lead       Assessment & Recommendations:   82 y.o. Caucasian male with uncontrolled type 2 diabetes mellitus, CAD, PAD, recurrent syncope, spinal stenosis s/p prior surgeries, interstitial lung disease  Dizziness, frequent falls: No specific symptoms to suggest arrhythmia. Nonetheless, pending Neurology referral, will repeat 2 week cardiac telemetry-last checked in 2022.  CAD:  Obstructive CAD with high risk anatomy, stable angina Successfully treated with LAD/ramus T stenting (10/01/2020) No angina symptoms at this time. Continue plavix, metoprolol succinate 25 mg daily. Continue rosuvastatin 40 mg daily. Check lipid panel.  PAD: B/l ABI 0.73 (08/2020) No critical limb ischemia or lifestyle limiting claudication Previously had a focal eschar on left foot 2nd toe, which could have been an atheroembolic event. Likely source could be distal aortic arahc aulcer. Now completely healed. Continue medical management and regular walking Continue plavix, statin.  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

## 2022-10-06 ENCOUNTER — Ambulatory Visit
Admission: RE | Admit: 2022-10-06 | Discharge: 2022-10-06 | Disposition: A | Discharge status: Home / Self Care | Payer: Medicare HMO | Source: Ambulatory Visit | Attending: Pulmonary Disease | Admitting: Pulmonary Disease

## 2022-10-06 DIAGNOSIS — I700 Atherosclerosis of aorta: Secondary | ICD-10-CM | POA: Diagnosis not present

## 2022-10-06 DIAGNOSIS — R918 Other nonspecific abnormal finding of lung field: Secondary | ICD-10-CM | POA: Diagnosis not present

## 2022-10-06 DIAGNOSIS — J849 Interstitial pulmonary disease, unspecified: Secondary | ICD-10-CM

## 2022-10-13 ENCOUNTER — Telehealth: Payer: Self-pay

## 2022-10-13 NOTE — Telephone Encounter (Signed)
If no clear loss of consciousness in the past 6 months, there is no strict contraindication to drive. However, I would recommend exercising utmost caution given his recent dizziness spells and frequent falls.  Thanks MJP

## 2022-10-14 NOTE — Telephone Encounter (Signed)
Send patient a message on his mychart

## 2022-10-16 DIAGNOSIS — E113293 Type 2 diabetes mellitus with mild nonproliferative diabetic retinopathy without macular edema, bilateral: Secondary | ICD-10-CM | POA: Diagnosis not present

## 2022-10-16 DIAGNOSIS — Z961 Presence of intraocular lens: Secondary | ICD-10-CM | POA: Diagnosis not present

## 2022-10-16 DIAGNOSIS — H52203 Unspecified astigmatism, bilateral: Secondary | ICD-10-CM | POA: Diagnosis not present

## 2022-10-20 ENCOUNTER — Telehealth: Payer: Self-pay

## 2022-10-20 NOTE — Telephone Encounter (Signed)
Patient called asking to see if it is ok for him to start driving again

## 2022-10-20 NOTE — Telephone Encounter (Signed)
I was asked the sane question a week ago. My response is same as it was back then.  If no clear loss of consciousness in the past 6 months, there is no strict contraindication to drive. However, I would recommend exercising utmost caution given his recent dizziness spells and frequent falls.  Thanks MJP

## 2022-10-21 ENCOUNTER — Encounter: Payer: Self-pay | Admitting: Surgery

## 2022-10-21 NOTE — Telephone Encounter (Signed)
Send a my chart message to the patient

## 2022-10-26 DIAGNOSIS — R55 Syncope and collapse: Secondary | ICD-10-CM | POA: Diagnosis not present

## 2022-10-26 DIAGNOSIS — R42 Dizziness and giddiness: Secondary | ICD-10-CM | POA: Diagnosis not present

## 2022-10-28 ENCOUNTER — Ambulatory Visit: Payer: Medicare HMO | Admitting: Pulmonary Disease

## 2022-10-28 ENCOUNTER — Encounter: Payer: Self-pay | Admitting: Pulmonary Disease

## 2022-10-28 VITALS — BP 102/50 | HR 75 | Temp 97.7°F | Ht 74.0 in | Wt 179.0 lb

## 2022-10-28 DIAGNOSIS — J849 Interstitial pulmonary disease, unspecified: Secondary | ICD-10-CM

## 2022-10-28 NOTE — Patient Instructions (Signed)
Your CT is stable which is good news Will order a follow-up CT in 1 year Return to clinic after scan
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
## 2022-11-01 ENCOUNTER — Emergency Department (HOSPITAL_BASED_OUTPATIENT_CLINIC_OR_DEPARTMENT_OTHER)
Admission: EM | Admit: 2022-11-01 | Discharge: 2022-11-01 | Disposition: A | Discharge status: Home / Self Care | Payer: Medicare HMO | Attending: Emergency Medicine | Admitting: Emergency Medicine

## 2022-11-01 ENCOUNTER — Emergency Department (HOSPITAL_BASED_OUTPATIENT_CLINIC_OR_DEPARTMENT_OTHER): Payer: Medicare HMO

## 2022-11-01 ENCOUNTER — Emergency Department (HOSPITAL_BASED_OUTPATIENT_CLINIC_OR_DEPARTMENT_OTHER): Payer: Medicare HMO | Admitting: Radiology

## 2022-11-01 ENCOUNTER — Encounter (HOSPITAL_BASED_OUTPATIENT_CLINIC_OR_DEPARTMENT_OTHER): Payer: Self-pay | Admitting: Emergency Medicine

## 2022-11-01 DIAGNOSIS — I10 Essential (primary) hypertension: Secondary | ICD-10-CM | POA: Diagnosis not present

## 2022-11-01 DIAGNOSIS — R6889 Other general symptoms and signs: Secondary | ICD-10-CM

## 2022-11-01 DIAGNOSIS — Z794 Long term (current) use of insulin: Secondary | ICD-10-CM | POA: Insufficient documentation

## 2022-11-01 DIAGNOSIS — I2510 Atherosclerotic heart disease of native coronary artery without angina pectoris: Secondary | ICD-10-CM | POA: Diagnosis not present

## 2022-11-01 DIAGNOSIS — R251 Tremor, unspecified: Secondary | ICD-10-CM | POA: Insufficient documentation

## 2022-11-01 DIAGNOSIS — J168 Pneumonia due to other specified infectious organisms: Secondary | ICD-10-CM | POA: Diagnosis not present

## 2022-11-01 DIAGNOSIS — J984 Other disorders of lung: Secondary | ICD-10-CM | POA: Diagnosis not present

## 2022-11-01 DIAGNOSIS — Z7984 Long term (current) use of oral hypoglycemic drugs: Secondary | ICD-10-CM | POA: Diagnosis not present

## 2022-11-01 DIAGNOSIS — E119 Type 2 diabetes mellitus without complications: Secondary | ICD-10-CM | POA: Diagnosis not present

## 2022-11-01 DIAGNOSIS — R918 Other nonspecific abnormal finding of lung field: Secondary | ICD-10-CM | POA: Diagnosis not present

## 2022-11-01 DIAGNOSIS — Z79899 Other long term (current) drug therapy: Secondary | ICD-10-CM | POA: Insufficient documentation

## 2022-11-01 DIAGNOSIS — J181 Lobar pneumonia, unspecified organism: Secondary | ICD-10-CM | POA: Insufficient documentation

## 2022-11-01 DIAGNOSIS — S0990XA Unspecified injury of head, initial encounter: Secondary | ICD-10-CM | POA: Diagnosis not present

## 2022-11-01 DIAGNOSIS — J189 Pneumonia, unspecified organism: Secondary | ICD-10-CM

## 2022-11-01 DIAGNOSIS — R059 Cough, unspecified: Secondary | ICD-10-CM | POA: Diagnosis not present

## 2022-11-01 DIAGNOSIS — Z20822 Contact with and (suspected) exposure to covid-19: Secondary | ICD-10-CM | POA: Diagnosis not present

## 2022-11-01 DIAGNOSIS — R911 Solitary pulmonary nodule: Secondary | ICD-10-CM | POA: Diagnosis not present

## 2022-11-01 LAB — COMPREHENSIVE METABOLIC PANEL
ALT: 17 U/L (ref 0–44)
AST: 19 U/L (ref 15–41)
Albumin: 4.1 g/dL (ref 3.5–5.0)
Alkaline Phosphatase: 58 U/L (ref 38–126)
Anion gap: 7 (ref 5–15)
BUN: 30 mg/dL — ABNORMAL HIGH (ref 8–23)
CO2: 29 mmol/L (ref 22–32)
Calcium: 9.2 mg/dL (ref 8.9–10.3)
Chloride: 98 mmol/L (ref 98–111)
Creatinine, Ser: 1.16 mg/dL (ref 0.61–1.24)
GFR, Estimated: >60 mL/min (ref >60)
Glucose, Bld: 228 mg/dL — ABNORMAL HIGH (ref 70–99)
Potassium: 5.3 mmol/L — ABNORMAL HIGH (ref 3.5–5.1)
Sodium: 134 mmol/L — ABNORMAL LOW (ref 135–145)
Total Bilirubin: 0.8 mg/dL (ref 0.3–1.2)
Total Protein: 7.2 g/dL (ref 6.5–8.1)

## 2022-11-01 LAB — SARS CORONAVIRUS 2 BY RT PCR: SARS Coronavirus 2 by RT PCR: NEGATIVE

## 2022-11-01 LAB — CBC
HCT: 33.3 % — ABNORMAL LOW (ref 39.0–52.0)
Hemoglobin: 10.9 g/dL — ABNORMAL LOW (ref 13.0–17.0)
MCH: 31.4 pg (ref 26.0–34.0)
MCHC: 32.7 g/dL (ref 30.0–36.0)
MCV: 96 fL (ref 80.0–100.0)
Platelets: 143 10*3/uL — ABNORMAL LOW (ref 150–400)
RBC: 3.47 MIL/uL — ABNORMAL LOW (ref 4.22–5.81)
RDW: 13.4 % (ref 11.5–15.5)
WBC: 10.3 10*3/uL (ref 4.0–10.5)
nRBC: 0 % (ref 0.0–0.2)

## 2022-11-01 LAB — LACTIC ACID, PLASMA
Lactic Acid, Venous: 2.5 mmol/L (ref 0.5–1.9)
Lactic Acid, Venous: 1.6 mmol/L (ref 0.5–1.9)

## 2022-11-01 MED ORDER — IOHEXOL 350 MG/ML SOLN
100.0000 mL | Freq: Once | INTRAVENOUS | Status: AC | PRN
  Administered 2022-11-01: 100 mL via INTRAVENOUS

## 2022-11-01 MED ORDER — AMOXICILLIN-POT CLAVULANATE 875-125 MG PO TABS: 1.0000 | ORAL_TABLET | Freq: Two times a day (BID) | ORAL | 0 refills | Status: DC

## 2022-11-01 MED ORDER — AZITHROMYCIN 250 MG PO TABS
500.0000 mg | ORAL_TABLET | ORAL | Status: AC
  Administered 2022-11-01: 500 mg via ORAL
  Filled 2022-11-01: qty 2

## 2022-11-01 MED ORDER — AZITHROMYCIN 250 MG PO TABS: 250.0000 mg | ORAL_TABLET | Freq: Every day | ORAL | 0 refills | Status: DC

## 2022-11-01 MED ORDER — SODIUM CHLORIDE 0.9 % IV SOLN
1.0000 g | Freq: Once | INTRAVENOUS | Status: AC
  Administered 2022-11-01: 1 g via INTRAVENOUS
  Filled 2022-11-01: qty 10

## 2022-11-01 MED ORDER — SODIUM ZIRCONIUM CYCLOSILICATE 10 G PO PACK
10.0000 g | PACK | ORAL | Status: AC
  Administered 2022-11-01: 10 g via ORAL
  Filled 2022-11-01: qty 1

## 2022-11-01 MED ORDER — LACTATED RINGERS IV BOLUS
1000.0000 mL | Freq: Once | INTRAVENOUS | Status: AC
  Administered 2022-11-01: 1000 mL via INTRAVENOUS

## 2022-11-01 NOTE — ED Notes (Signed)
One set of blood cultures drawn sent to lab for on hold

## 2022-11-01 NOTE — ED Provider Notes (Incomplete)
[place] EMERGENCY DEPARTMENT AT [facility] Provider Note   CSN: [id number] Arrival date & time: 11/01/22  1035     History {Add pertinent medical, surgical, social history, OB history to HPI:1} Chief Complaint  Patient presents with   Tremors    Harry Zamora is a 82 y.o. male.  82 year old male with a history of CAD, PAD, DM, HTN, HLD who presents to the emergency department with cough and shaking.  Reports that over the past few months has had a tremor.  The past few days has had a nonproductive cough as well as chills and a mild headache.  Has had a sore throat.  In that time has started developing some whole body shaking that typically will come on for 30 seconds at a time and spontaneously resolved.  He is alert and oriented during it.  No history of seizures.  No alcohol use.  No significant shortness of breath.  No chest pain.  No fevers at home.  Was evaluated for interstitial lung disease after a pneumonia that he had had but was cleared by pulmonology.  Recently was evaluated for ataxia on 09/13/2022 and had a CTA of the head and neck that showed severe bilateral ICA stenosis as well as having an MRI that did not show any acute signs of stroke.       Home Medications Prior to Admission medications   Medication Sig Start Date End Date Taking? Authorizing Provider  ascorbic acid (VITAMIN C) 500 MG tablet Take 500 mg by mouth daily.    [provider]  Calcium Carb-Cholecalciferol (CALCIUM 1000 + D PO) Take by mouth.    [provider]  clopidogrel (PLAVIX) 75 MG tablet Take 1 tablet (75 mg total) by mouth daily. **Restart Plavix on 04/29/2021** 04/26/21   Val Eagle D, NP  Continuous Blood Gluc Sensor (FREESTYLE LIBRE 3 SENSOR) MISC  04/13/22   [provider]  DULoxetine (CYMBALTA) 30 MG capsule Take 30 mg by mouth daily. 06/05/21   [provider]  Ferrous Sulfate Dried (HIGH POTENCY IRON) 65 MG TABS Take by mouth.     [provider]  gabapentin (NEURONTIN) 100 MG capsule Take 100 mg by mouth at bedtime. 10/09/14   [provider]  HYDROcodone-acetaminophen (NORCO) 5-325 MG tablet Take 1 tablet by mouth every 8 (eight) hours as needed for moderate pain. 04/30/22   Horton, Danford Bad M, DO  lovastatin (MEVACOR) 40 MG tablet Take 40 mg by mouth daily. 05/05/21   [provider]  meloxicam (MOBIC) 15 MG tablet Take 15 mg by mouth daily.    [provider]  metFORMIN (GLUCOPHAGE) 500 MG tablet Take 2 tablets (1,000 mg total) by mouth 2 (two) times daily with a meal. Resume 10/03/2020 Patient taking differently: Take 500 mg by mouth 2 (two) times daily with a meal. Resume 10/03/2020 10/03/20   Patwardhan, Anabel Bene, MD  metoprolol succinate (TOPROL-XL) 25 MG 24 hr tablet Take 25 mg by mouth daily. 04/28/22   [provider]  MULTIPLE MINERALS PO Take by mouth.    [provider]  nitroGLYCERIN (NITROSTAT) 0.4 MG SL tablet Place 1 tablet (0.4 mg total) under the tongue every 5 (five) minutes as needed for chest pain. 09/02/20 10/05/22  Patwardhan, Anabel Bene, MD  pregabalin (LYRICA) 75 MG capsule 1 capsule Oral TID    [provider]  rOPINIRole (REQUIP) 4 MG tablet Take 4 mg by mouth at bedtime.    [provider]  rosuvastatin (  CRESTOR) 40 MG tablet TAKE 1 TABLET(40 MG) BY MOUTH DAILY 06/25/22   Patwardhan, Manish J, MD  Semaglutide, 1 MG/DOSE, (OZEMPIC, 1 MG/DOSE,) 4 MG/3ML SOPN inject 1mg  Subcutaneous weekly    [provider]  traMADol (ULTRAM) 50 MG tablet 1 tablet Oral Once a day    [provider]  TRESIBA FLEXTOUCH 100 UNIT/ML FlexTouch Pen daily.    [provider]  vitamin B-12 (CYANOCOBALAMIN) 500 MCG tablet Take 500 mcg by mouth daily.    [provider]      Allergies    Atorvastatin, Levaquin [levofloxacin in d5w], Niaspan [niacin er], and Tramadol    Review of Systems   Review of Systems  Physical  Exam Updated Vital Signs BP (!) 134/58   Pulse 81   Temp 98.9 F (37.2 C) (Oral)   Resp 13   SpO2 95%  Physical Exam Vitals and nursing note reviewed.  Constitutional:      General: He is not in acute distress.    Appearance: He is well-developed.     Comments: Occasionally having rigors.  Complaining of being cold.  Covered in blankets.  HENT:     Head: Normocephalic and atraumatic.     Right Ear: External ear normal.     Left Ear: External ear normal.     Nose: Nose normal.  Eyes:     Extraocular Movements: Extraocular movements intact.     Conjunctiva/sclera: Conjunctivae normal.     Pupils: Pupils are equal, round, and reactive to light.  Cardiovascular:     Rate and Rhythm: Normal rate and regular rhythm.     Heart sounds: Normal heart sounds.  Pulmonary:     Effort: Pulmonary effort is normal. No respiratory distress.     Breath sounds: Rhonchi (Left lower lobe) present.  Abdominal:     General: There is no distension.     Palpations: Abdomen is soft. There is no mass.     Tenderness: There is no abdominal tenderness. There is no guarding.  Musculoskeletal:     Cervical back: Normal range of motion and neck supple.     Right lower leg: No edema.     Left lower leg: No edema.  Skin:    General: Skin is warm and dry.  Neurological:     Mental Status: He is alert.     Comments: MENTAL STATUS: AAOx3 CRANIAL NERVES: II: Right pupil 5 mm, left pupil 3 mm does have a history of cataract surgery, no RAPD, no VF deficits III, IV, VI: EOM intact, no gaze preference or deviation, no nystagmus. V: normal sensation to light touch in V1, V2, and V3 segments bilaterally VII: no facial weakness or asymmetry, no nasolabial fold flattening VIII: normal hearing to speech and finger friction IX, X: normal palatal elevation, no uvular deviation XI: 5/5 head turn and 5/5 shoulder shrug bilaterally XII: midline tongue protrusion MOTOR: 5/5 strength in R shoulder flexion, elbow  flexion and extension, and grip strength. 5/5 strength in L shoulder flexion, elbow flexion and extension, and grip strength.  5/5 strength in R hip and knee flexion, knee extension, ankle plantar and dorsiflexion. 5/5 strength in L hip and knee flexion, knee extension, ankle plantar and dorsiflexion. SENSORY: Normal sensation to light touch in all extremities COORD: Normal finger to nose and heel to shin, no tremor, no dysmetria STATION: No truncal ataxia   Psychiatric:        Mood and Affect: Mood normal.  Behavior: Behavior normal.     ED Results / Procedures / Treatments   Labs (all labs ordered are listed, but only abnormal results are displayed) Labs Reviewed - No data to display  EKG None  Radiology No results found.  Procedures Procedures  {Document cardiac monitor, telemetry assessment procedure when appropriate:1}  Medications Ordered in ED Medications - No data to display  ED Course/ Medical Decision Making/ A&P   {   Click here for ABCD2, HEART and other calculatorsREFRESH Note before signing :1}                              Medical Decision Making Amount and/or Complexity of Data Reviewed Labs: ordered. Radiology: ordered.   ***  {Document critical care time when appropriate:1} {Document review of labs and clinical decision tools ie heart score, Chads2Vasc2 etc:1}  {Document your independent review of radiology images, and any outside records:1} {Document your discussion with family members, caretakers, and with consultants:1} {Document social determinants of health affecting pt's care:1} {Document your decision making why or why not admission, treatments were needed:1} Final Clinical Impression(s) / ED Diagnoses Final diagnoses:  None    Rx / DC Orders ED Discharge Orders     None

## 2022-11-01 NOTE — ED Triage Notes (Signed)
Pt presents to ED POV. Pt c/o trembling in the R arm and bilateral legs that began last night. Pt reports no hx of same. Able to ambulate to bed

## 2022-11-01 NOTE — ED Notes (Signed)
Pt provided with food from the patient kitchen.

## 2022-11-01 NOTE — Discharge Instructions (Signed)
You were seen for pneumonia in the emergency department.   At home, please take the antibiotics we have prescribed you.    Check your MyChart online for the results of any tests that had not resulted by the time you left the emergency department.   Follow-up with your primary doctor in 2-3 days regarding your visit.    Return immediately to the emergency department if you experience any of the following: Difficulty breathing, or any other concerning symptoms.    Thank you for visiting our Emergency Department. It was a pleasure taking care of you today.

## 2022-11-01 NOTE — ED Notes (Signed)
CRITICAL VALUE STICKER  CRITICAL VALUE:lactic  RECEIVER (on-site recipient of call):Lilybeth Vien,rn  DATE & TIME NOTIFIED: 11/01/2022 1222  MESSENGER (representative from lab):  MD NOTIFIED: Vonita Moss, MD  TIME OF NOTIFICATION:11/01/2022 1222  RESPONSE:

## 2022-11-02 ENCOUNTER — Telehealth: Payer: Self-pay | Admitting: Pulmonary Disease

## 2022-11-02 ENCOUNTER — Emergency Department (HOSPITAL_BASED_OUTPATIENT_CLINIC_OR_DEPARTMENT_OTHER)
Admission: EM | Admit: 2022-11-02 | Discharge: 2022-11-02 | Disposition: A | Discharge status: Home / Self Care | Payer: Medicare HMO | Attending: Emergency Medicine | Admitting: Emergency Medicine

## 2022-11-02 ENCOUNTER — Emergency Department (HOSPITAL_BASED_OUTPATIENT_CLINIC_OR_DEPARTMENT_OTHER): Payer: Medicare HMO

## 2022-11-02 ENCOUNTER — Other Ambulatory Visit: Payer: Self-pay

## 2022-11-02 DIAGNOSIS — S0993XA Unspecified injury of face, initial encounter: Secondary | ICD-10-CM | POA: Diagnosis not present

## 2022-11-02 DIAGNOSIS — Z23 Encounter for immunization: Secondary | ICD-10-CM | POA: Insufficient documentation

## 2022-11-02 DIAGNOSIS — S0990XA Unspecified injury of head, initial encounter: Secondary | ICD-10-CM | POA: Diagnosis present

## 2022-11-02 DIAGNOSIS — W01198A Fall on same level from slipping, tripping and stumbling with subsequent striking against other object, initial encounter: Secondary | ICD-10-CM | POA: Diagnosis not present

## 2022-11-02 DIAGNOSIS — S0191XA Laceration without foreign body of unspecified part of head, initial encounter: Secondary | ICD-10-CM | POA: Diagnosis not present

## 2022-11-02 DIAGNOSIS — S0181XA Laceration without foreign body of other part of head, initial encounter: Secondary | ICD-10-CM | POA: Diagnosis not present

## 2022-11-02 DIAGNOSIS — R9082 White matter disease, unspecified: Secondary | ICD-10-CM | POA: Diagnosis not present

## 2022-11-02 DIAGNOSIS — G238 Other specified degenerative diseases of basal ganglia: Secondary | ICD-10-CM | POA: Diagnosis not present

## 2022-11-02 DIAGNOSIS — S0081XA Abrasion of other part of head, initial encounter: Secondary | ICD-10-CM | POA: Diagnosis not present

## 2022-11-02 MED ORDER — TETANUS-DIPHTH-ACELL PERTUSSIS 5-2.5-18.5 LF-MCG/0.5 IM SUSY
0.5000 mL | PREFILLED_SYRINGE | Freq: Once | INTRAMUSCULAR | Status: AC
  Administered 2022-11-02: 0.5 mL via INTRAMUSCULAR
  Filled 2022-11-02: qty 0.5

## 2022-11-02 NOTE — ED Provider Notes (Signed)
[place] EMERGENCY DEPARTMENT AT [facility] Provider Note   CSN: [id number] Arrival date & time: 11/02/22  1336     History  Chief Complaint  Patient presents with   Harry Zamora    Harry Zamora is a 82 y.o. male.  Who presents emergency department for mechanical fall.  Patient was seen at the emergency department yesterday for tremor.  He had a mechanical fall yesterday when he got home and hit his head and face on the bed rail.  He did not lose consciousness.  Given the fact that he had just left the emergency department he did not really feel like coming back.  He is unsure of his last tetanus vaccination.  He does not take a blood thinner.   Fall       Home Medications Prior to Admission medications   Medication Sig Start Date End Date Taking? Authorizing Provider  amoxicillin-clavulanate (AUGMENTIN) 875-125 MG tablet Take 1 tablet by mouth every 12 (twelve) hours. 11/01/22   Rondel Baton, MD  ascorbic acid (VITAMIN C) 500 MG tablet Take 500 mg by mouth daily.    [provider]  azithromycin (ZITHROMAX) 250 MG tablet Take 1 tablet (250 mg total) by mouth daily. Take first 2 tablets together, then 1 every day until finished. 11/01/22   Rondel Baton, MD  Calcium Carb-Cholecalciferol (CALCIUM 1000 + D PO) Take by mouth.    [provider]  clopidogrel (PLAVIX) 75 MG tablet Take 1 tablet (75 mg total) by mouth daily. **Restart Plavix on 04/29/2021** 04/26/21   Val Eagle D, NP  Continuous Blood Gluc Sensor (FREESTYLE LIBRE 3 SENSOR) MISC  04/13/22   [provider]  DULoxetine (CYMBALTA) 30 MG capsule Take 30 mg by mouth daily. 06/05/21   [provider]  Ferrous Sulfate Dried (HIGH POTENCY IRON) 65 MG TABS Take by mouth.    [provider]  gabapentin (NEURONTIN) 100 MG capsule Take 100 mg by mouth at bedtime. 10/09/14   [provider]  HYDROcodone-acetaminophen (NORCO) 5-325 MG tablet Take 1 tablet by mouth  every 8 (eight) hours as needed for moderate pain. 04/30/22   Horton, Danford Bad M, DO  lovastatin (MEVACOR) 40 MG tablet Take 40 mg by mouth daily. 05/05/21   [provider]  meloxicam (MOBIC) 15 MG tablet Take 15 mg by mouth daily.    [provider]  metFORMIN (GLUCOPHAGE) 500 MG tablet Take 2 tablets (1,000 mg total) by mouth 2 (two) times daily with a meal. Resume 10/03/2020 Patient taking differently: Take 500 mg by mouth 2 (two) times daily with a meal. Resume 10/03/2020 10/03/20   Patwardhan, Anabel Bene, MD  metoprolol succinate (TOPROL-XL) 25 MG 24 hr tablet Take 25 mg by mouth daily. 04/28/22   [provider]  MULTIPLE MINERALS PO Take by mouth.    [provider]  nitroGLYCERIN (NITROSTAT) 0.4 MG SL tablet Place 1 tablet (0.4 mg total) under the tongue every 5 (five) minutes as needed for chest pain. 09/02/20 10/05/22  Patwardhan, Anabel Bene, MD  pregabalin (LYRICA) 75 MG capsule 1 capsule Oral TID    [provider]  rOPINIRole (REQUIP) 4 MG tablet Take 4 mg by mouth at bedtime.    [provider]  rosuvastatin (CRESTOR) 40 MG tablet TAKE 1 TABLET(40 MG) BY MOUTH DAILY 06/25/22   Patwardhan, Anabel Bene, MD  Semaglutide, 1 MG/DOSE, (OZEMPIC, 1 MG/DOSE,) 4 MG/3ML SOPN inject 1mg  Subcutaneous weekly    [provider]  traMADol (  ULTRAM) 50 MG tablet 1 tablet Oral Once a day    [provider]  TRESIBA FLEXTOUCH 100 UNIT/ML FlexTouch Pen daily.    [provider]  vitamin B-12 (CYANOCOBALAMIN) 500 MCG tablet Take 500 mcg by mouth daily.    [provider]      Allergies    Atorvastatin, Levaquin [levofloxacin in d5w], Niaspan [niacin er], and Tramadol    Review of Systems   Review of Systems  Physical Exam Updated Vital Signs BP (!) 102/51   Pulse 68   Temp (!) 97.3 F (36.3 C) (Oral)   Resp (!) 22   Ht 6\' 2"  (1.88 m)   Wt 81.2 kg   SpO2 95%   BMI 22.98 kg/m  Physical Exam Vitals and nursing note  reviewed.  Constitutional:      General: He is not in acute distress.    Appearance: He is well-developed. He is not diaphoretic.  HENT:     Head: Normocephalic.     Comments: Bruising and swelling to the nose and left forehead. 2 circular lacerations which appear well-approximated to the scalp Eyes:     General: No scleral icterus.    Conjunctiva/sclera: Conjunctivae normal.  Cardiovascular:     Rate and Rhythm: Normal rate and regular rhythm.     Heart sounds: Normal heart sounds.  Pulmonary:     Effort: Pulmonary effort is normal. No respiratory distress.     Breath sounds: Normal breath sounds.  Abdominal:     Palpations: Abdomen is soft.     Tenderness: There is no abdominal tenderness.  Musculoskeletal:     Cervical back: Normal range of motion and neck supple.  Skin:    General: Skin is warm and dry.  Neurological:     Mental Status: He is alert.  Psychiatric:        Behavior: Behavior normal.     ED Results / Procedures / Treatments   Labs (all labs ordered are listed, but only abnormal results are displayed) Labs Reviewed - No data to display  EKG None  Radiology CT Angio Chest PE W and/or Wo Contrast  Result Date: 11/01/2022 CLINICAL DATA:  Cough and left-sided rhonchi. Trembling in right arm and bilateral leg switch began last night. Rule out pulmonary embolus. EXAM: CT ANGIOGRAPHY CHEST WITH CONTRAST TECHNIQUE: Multidetector CT imaging of the chest was performed using the standard protocol during bolus administration of intravenous contrast. Multiplanar CT image reconstructions and MIPs were obtained to evaluate the vascular anatomy. RADIATION DOSE REDUCTION: This exam was performed according to the departmental dose-optimization program which includes automated exposure control, adjustment of the mA and/or kV according to patient size and/or use of iterative reconstruction technique. CONTRAST:  OMNIPAQUE IOHEXOL 350 MG/ML SOLN COMPARISON:  CT chest  10/06/2022 FINDINGS: Cardiovascular: Satisfactory opacification of the pulmonary arteries to the segmental level. No evidence of pulmonary embolism. Normal heart size. No pericardial effusion. Aortic atherosclerosis. Multi vessel coronary artery calcifications. Mediastinum/Nodes: Thyroid gland, trachea, and esophagus appear normal. Bilateral calcified hilar lymph nodes. No mediastinal or hilar adenopathy identified. Lungs/Pleura: Airspace disease is identified within the posterior left lung base, image 110/6. This is new when compared with the prior there is no pleural effusion or signs of interstitial edema. Mild ground-glass attenuation and tree-in-bud nodularity identified within the left upper lobe. Mild septal thickening with ground-glass attenuation noted in the posterior right lower lobe. Calcified granuloma is seen in the posteromedial right upper lobe. Upper Abdomen: No acute abnormality.  Cholecystectomy.  Musculoskeletal: No chest wall abnormality. No acute or significant osseous findings. Review of the MIP images confirms the above findings. IMPRESSION: 1. No evidence for acute pulmonary embolus. 2. Airspace disease is identified within the posterior left lung base. This is new when compared with the prior exam. Findings are concerning for pneumonia. 3. Mild ground-glass attenuation and tree-in-bud nodularity identified within the left upper lobe. Mild septal thickening with ground-glass attenuation noted in the posterior right lower lobe. Findings are favored to represent sequelae of infectious bronchiolitis. 4. Coronary artery calcifications. 5.  Aortic Atherosclerosis (ICD10-I70.0). Electronically Signed   By: Signa Kell M.D.   On: 11/01/2022 14:34   DG Chest 2 View  Result Date: 11/01/2022 CLINICAL DATA:  Cough. EXAM: CHEST - 2 VIEW COMPARISON:  05/05/2022 FINDINGS: The lungs are clear without focal pneumonia, edema, pneumothorax or pleural effusion. Trace atelectasis or scarring at the left  base. The cardiopericardial silhouette is within normal limits for size. No acute bony abnormality. Telemetry leads overlie the chest. IMPRESSION: No active cardiopulmonary disease. Electronically Signed   By: Kennith Center M.D.   On: 11/01/2022 12:45   CT Head Wo Contrast  Result Date: 11/01/2022 CLINICAL DATA:  Trauma in the right arm and bilateral legs. Right pupillary dilatation. EXAM: CT HEAD WITHOUT CONTRAST TECHNIQUE: Contiguous axial images were obtained from the base of the skull through the vertex without intravenous contrast. RADIATION DOSE REDUCTION: This exam was performed according to the departmental dose-optimization program which includes automated exposure control, adjustment of the mA and/or kV according to patient size and/or use of iterative reconstruction technique. COMPARISON:  09/13/2022 FINDINGS: Brain: There is no evidence for acute hemorrhage, hydrocephalus, mass lesion, or abnormal extra-axial fluid collection. No definite CT evidence for acute infarction. 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 Vascular: No hyperdense vessel or unexpected calcification. Skull: No evidence for fracture. No worrisome lytic or sclerotic lesion. Sinuses/Orbits: The visualized paranasal sinuses and mastoid air cells are clear. Visualized portions of the globes and intraorbital fat are unremarkable. Other: None. IMPRESSION: 1. No acute intracranial abnormality. 2. 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 Electronically Signed   By: Kennith Center M.D.   On: 11/01/2022 12:44    Procedures Procedures    Medications Ordered in ED Medications - No data to display  ED Course/ Medical Decision Making/ A&P                                 Medical Decision  Making Patient here with mechanical fall.  I visualized interpreted CT head and CT maxillofacial.  No evidence of intracranial hemorrhage, subdural hematoma, skull fracture or facial fractures. Patient's tetanus vaccine updated in the emergency department.  I personally cleaned and dressed his wounds.  His suture his lacerations are out of the window for repair but appear to be well aligned and already healing.  He is advised on home cleaning and supportive care.  Discussed outpatient follow-up and return precautions.  Suggested OT assessment of the house for fall prevention.  Also suggested using a walker to help prevent falls.  Amount and/or Complexity of Data Reviewed Radiology: ordered.  Risk Prescription drug management.           Final Clinical Impression(s) / ED Diagnoses Final diagnoses:  Facial abrasion, initial  encounter  Laceration of head without foreign body, unspecified part of head, initial encounter    Rx / DC Orders ED Discharge Orders     None         Arthor Captain, PA-C 11/02/22 2059    Ernie Avena, MD 11/03/22 [phone]

## 2022-11-02 NOTE — Telephone Encounter (Signed)
Patient's wife is calling letting Dr.Mannam know that he was diagnosed with pneumonia over the weekend at urgent care.Patient's wife wants to know if he should also be seen here. Please advise. [phone]

## 2022-11-02 NOTE — Discharge Instructions (Addendum)
. °   Keep area clean and dry for 24 hours. Do not remove bandage, if applied.  After 24 hours, remove bandage and wash wound gently with mild soap and warm water. Reapply a new bandage after cleaning wound, if directed.  Continue daily cleansing with soap and water until stitches/staples are removed.  Do not apply any ointments or creams to the wound while stitches/staples are in place, as this may cause delayed healing.  Seek medical careif you experience any of the following signs of infection: Swelling, redness, pus drainage, streaking, fever >101.0 F  Seek care if you experience excessive bleeding that does not stop after 15-20 minutes of constant, firm pressure.

## 2022-11-02 NOTE — ED Triage Notes (Addendum)
Pt to ED c/o head laceration. Pt had mechanical fall yesterday night when getting into bed. 2 lacerations noted : one to right side and one to left side of top of head. Bleeding controlled in triage. No LOC . Not on blood thinner  Of note. Pt was dx with pneumonia yesterday and started on ABX.

## 2022-11-02 NOTE — ED Notes (Signed)
Discharge paperwork given and verbally understood. 

## 2022-11-03 NOTE — Telephone Encounter (Signed)
Reviewed ED visit and CT scan showing left lower lobe pneumonia.  He has been treated with Augmentin and azithromycin which is appropriate.  I called patient's wife and got the voicemail.  Left a message that he can continue the treatment as prescribed by the ED.  Call back if is not getting any better to be seen in clinic.  Nothing further needed.

## 2022-11-04 ENCOUNTER — Ambulatory Visit: Payer: Medicare HMO | Admitting: Surgery

## 2022-11-06 ENCOUNTER — Telehealth: Payer: Self-pay | Admitting: *Deleted

## 2022-11-06 NOTE — Telephone Encounter (Signed)
Transition Care Management Unsuccessful Follow-up Telephone Call  Date of discharge and from where:  [facility]8/19/2024  Attempts: 1st   Reason for unsuccessful TCM follow-up call:  Left voice message

## 2022-11-07 DIAGNOSIS — R42 Dizziness and giddiness: Secondary | ICD-10-CM | POA: Diagnosis not present

## 2022-11-07 DIAGNOSIS — R55 Syncope and collapse: Secondary | ICD-10-CM | POA: Diagnosis not present

## 2022-11-09 ENCOUNTER — Ambulatory Visit: Payer: Medicare HMO | Admitting: Surgery

## 2022-11-09 ENCOUNTER — Encounter: Payer: Self-pay | Admitting: Surgery

## 2022-11-09 VITALS — BP 135/65 | HR 66 | Temp 97.9°F | Resp 20 | Ht 74.0 in | Wt 181.0 lb

## 2022-11-09 DIAGNOSIS — R42 Dizziness and giddiness: Secondary | ICD-10-CM

## 2022-11-09 DIAGNOSIS — I70213 Atherosclerosis of native arteries of extremities with intermittent claudication, bilateral legs: Secondary | ICD-10-CM | POA: Diagnosis not present

## 2022-11-09 DIAGNOSIS — R55 Syncope and collapse: Secondary | ICD-10-CM | POA: Diagnosis not present

## 2022-11-09 NOTE — Progress Notes (Signed)
[facility] [place]  Patient name: Harry Zamora MRN: [id number] DOB: [birth year] Sex: male   REASON FOR VISIT:    Follow up  HISOTRY OF PRESENT ILLNESS:    Harry Zamora is a 82 y.o. male who is known to me having undergone anterior exposure of L5-S1 for spinal stenosis on 09/07/2019.  At that time, I could not palpate pedal pulses.  I saw him back for bilateral foot pain and started him on Pletal.  He did have a good response to the medication.  He was recently evaluated by Dr. Evlyn Kanner for dizziness.  This happened acutely.  Everything started to spin.  This led to several falls.  He continues to have multiple falls.  He has been evaluated by cardiology who cannot find a etiology.  He has been referred to neurology.  His last fall was this past  Patient is medically managed for hypertension.  He is a diabetic.  He is on dual antiplatelet therapy as well as a statin for hypercholesterolemia.  He has a history of coronary artery disease which has been treated by Dr. Jacinto Halim.  He had PCI in 2022   PAST MEDICAL HISTORY:   Past Medical History:  Diagnosis Date   Anemia    as an infant   Arthritis    Back pain    Depression    Disc displacement, lumbar    Gynecomastia    History of kidney stones    Hyperlipidemia    Hypertension    no longer on medications   Hypertriglyceridemia    Insomnia    Low back pain    Lumbar radiculopathy    Lumbar stenosis    OA (osteoarthritis)    Polyneuropathy in diabetes(357.2)    Prostate cancer (HCC) 2003   Restless legs    Ringing in ears    RLS (restless legs syndrome)    Type 2 diabetes mellitus (HCC)      FAMILY HISTORY:   Family History  Problem Relation Age of Onset   Arthritis Mother    Hypertension Mother    Diabetes Mother    Heart attack Mother    Cancer Father     SOCIAL HISTORY:   Social History   Tobacco Use   Smoking status: Some Days    Types: Cigars    Smokeless tobacco: Never   Tobacco comments:    occasional  Substance Use Topics   Alcohol use: Not Currently    Comment: social-beer     ALLERGIES:   Allergies  Allergen Reactions   Atorvastatin Other (See Comments)   Levaquin [Levofloxacin In D5w] Rash   Niaspan [Niacin Er] Rash and Other (See Comments)    Flushing reaction   Tramadol Rash     CURRENT MEDICATIONS:   Current Outpatient Medications  Medication Sig Dispense Refill   ascorbic acid (VITAMIN C) 500 MG tablet Take 500 mg by mouth daily.     Calcium Carb-Cholecalciferol (CALCIUM 1000 + D PO) Take by mouth.     clopidogrel (PLAVIX) 75 MG tablet Take 1 tablet (75 mg total) by mouth daily. **Restart Plavix on 04/29/2021** 90 tablet 3   Continuous Blood Gluc Sensor (FREESTYLE LIBRE 3 SENSOR) MISC      DULoxetine (CYMBALTA) 30 MG capsule Take 30 mg by mouth daily.     Ferrous Sulfate Dried (HIGH POTENCY IRON) 65 MG TABS Take by mouth.     gabapentin (NEURONTIN) 100 MG capsule Take 100 mg by mouth at  bedtime.     HYDROcodone-acetaminophen (NORCO) 5-325 MG tablet Take 1 tablet by mouth every 8 (eight) hours as needed for moderate pain. 10 tablet 0   lovastatin (MEVACOR) 40 MG tablet Take 40 mg by mouth daily.     meloxicam (MOBIC) 15 MG tablet Take 15 mg by mouth daily.     metFORMIN (GLUCOPHAGE) 500 MG tablet Take 2 tablets (1,000 mg total) by mouth 2 (two) times daily with a meal. Resume 10/03/2020 (Patient taking differently: Take 500 mg by mouth 2 (two) times daily with a meal. Resume 10/03/2020)  1   metoprolol succinate (TOPROL-XL) 25 MG 24 hr tablet Take 25 mg by mouth daily.     MULTIPLE MINERALS PO Take by mouth.     nitroGLYCERIN (NITROSTAT) 0.4 MG SL tablet Place 1 tablet (0.4 mg total) under the tongue every 5 (five) minutes as needed for chest pain. 30 tablet 3   pregabalin (LYRICA) 75 MG capsule 1 capsule Oral TID     rOPINIRole (REQUIP) 4 MG tablet Take 4 mg by mouth at bedtime.     rosuvastatin (CRESTOR) 40  MG tablet TAKE 1 TABLET(40 MG) BY MOUTH DAILY 90 tablet 3   Semaglutide, 1 MG/DOSE, (OZEMPIC, 1 MG/DOSE,) 4 MG/3ML SOPN inject 1mg  Subcutaneous weekly     traMADol (ULTRAM) 50 MG tablet 1 tablet Oral Once a day     TRESIBA FLEXTOUCH 100 UNIT/ML FlexTouch Pen daily.     vitamin B-12 (CYANOCOBALAMIN) 500 MCG tablet Take 500 mcg by mouth daily.     No current facility-administered medications for this visit.    REVIEW OF SYSTEMS:   [X]  denotes positive finding, [ ]  denotes negative finding Cardiac  Comments:  Chest pain or chest pressure:    Shortness of breath upon exertion:    Short of breath when lying flat:    Irregular heart rhythm:        Vascular    Pain in calf, thigh, or hip brought on by ambulation:    Pain in feet at night that wakes you up from your sleep:     Blood clot in your veins:    Leg swelling:         Pulmonary    Oxygen at home:    Productive cough:     Wheezing:         Neurologic    Sudden weakness in arms or legs:     Sudden numbness in arms or legs:     Sudden onset of difficulty speaking or slurred speech:    Temporary loss of vision in one eye:     Problems with dizziness:  x       Gastrointestinal    Blood in stool:     Vomited blood:         Genitourinary    Burning when urinating:     Blood in urine:        Psychiatric    Major depression:         Hematologic    Bleeding problems:    Problems with blood clotting too easily:        Skin    Rashes or ulcers:        Constitutional    Fever or chills:      PHYSICAL EXAM:   Vitals:   11/09/22 1116 11/09/22 1118  BP: (!) 147/65 135/65  Pulse: 66   Resp: 20   Temp: 97.9 F (36.6 C)   SpO2: 95%  Weight: 181 lb (82.1 kg)   Height: 6\' 2"  (1.88 m)     GENERAL: The patient is a well-nourished male, in no acute distress. The vital signs are documented above. CARDIAC: There is a regular rate and rhythm.  PULMONARY: Non-labored respirations ABDOMEN: Soft and non-tender s.   MUSCULOSKELETAL: There are no major deformities or cyanosis. NEUROLOGIC: No focal weakness or paresthesias are detected. SKIN: There are no ulcers or rashes noted. PSYCHIATRIC: The patient has a normal affect.  STUDIES:   I have reviewed the following CT scan: 1. No evidence of acute intracranial abnormality. 2. No large vessel occlusion. 3. 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 4. Cervical carotid artery atherosclerosis without significant stenosis. 5. Mild right vertebral artery origin stenosis. 6.  Aortic Atherosclerosis (ICD10-I70.0).  MEDICAL ISSUES:   Dizziness: CT angiogram of the neck shows no significant extracranial carotid disease.  Most recent ultrasound shows antegrade vertebral artery blood flow.  On CT there is mild vertebral artery origin stenosis on the right.  There is evidence of significant intracranial atherosclerotic vascular disease.  It is unclear at this time the etiology of his dizziness although it is a significant problem for him that is let him to the ER several times for falling.  From a vascular perspective, he does not have any significant extracranial disease that would explain his symptoms.  I think that the next step is to have him evaluated by neurology.  He has an appointment scheduled in September.  I will try and see if there is any way we can get that appointment moved up.  He should continue with his syncope/arrhythmia workup with Dr. Gwenyth Allegra, MD, [facility] [phone] Pager [phone]

## 2022-11-11 ENCOUNTER — Telehealth: Payer: Self-pay

## 2022-11-11 NOTE — Telephone Encounter (Signed)
Called and spoke to patient he voiced understanding

## 2022-11-11 NOTE — Telephone Encounter (Signed)
Patient wants to know if he can drive

## 2022-11-11 NOTE — Telephone Encounter (Signed)
As previously indicated, If no clear loss of consciousness in the past 6 months, there is no strict contraindication to drive. However, I would recommend exercising utmost caution given his recent dizziness spells and frequent falls.  Thanks MJP

## 2022-11-18 ENCOUNTER — Ambulatory Visit
Admission: RE | Admit: 2022-11-18 | Discharge: 2022-11-18 | Disposition: A | Discharge status: Home / Self Care | Payer: Medicare HMO | Source: Ambulatory Visit | Attending: Surgery | Admitting: Surgery

## 2022-11-18 ENCOUNTER — Ambulatory Visit: Payer: Medicare HMO | Admitting: Neurology

## 2022-11-18 ENCOUNTER — Encounter: Payer: Self-pay | Admitting: Neurology

## 2022-11-18 ENCOUNTER — Ambulatory Visit: Payer: Medicare HMO | Admitting: Surgery

## 2022-11-18 VITALS — BP 157/74 | HR 62 | Ht 74.0 in | Wt 180.0 lb

## 2022-11-18 DIAGNOSIS — R410 Disorientation, unspecified: Secondary | ICD-10-CM

## 2022-11-18 DIAGNOSIS — M25562 Pain in left knee: Secondary | ICD-10-CM | POA: Diagnosis not present

## 2022-11-18 DIAGNOSIS — E079 Disorder of thyroid, unspecified: Secondary | ICD-10-CM | POA: Diagnosis not present

## 2022-11-18 DIAGNOSIS — Z23 Encounter for immunization: Secondary | ICD-10-CM | POA: Diagnosis not present

## 2022-11-18 DIAGNOSIS — R7989 Other specified abnormal findings of blood chemistry: Secondary | ICD-10-CM | POA: Diagnosis not present

## 2022-11-18 DIAGNOSIS — I719 Aortic aneurysm of unspecified site, without rupture: Secondary | ICD-10-CM | POA: Diagnosis not present

## 2022-11-18 DIAGNOSIS — R269 Unspecified abnormalities of gait and mobility: Secondary | ICD-10-CM | POA: Diagnosis not present

## 2022-11-18 DIAGNOSIS — R700 Elevated erythrocyte sedimentation rate: Secondary | ICD-10-CM | POA: Diagnosis not present

## 2022-11-18 DIAGNOSIS — G6289 Other specified polyneuropathies: Secondary | ICD-10-CM

## 2022-11-18 DIAGNOSIS — R799 Abnormal finding of blood chemistry, unspecified: Secondary | ICD-10-CM | POA: Diagnosis not present

## 2022-11-18 DIAGNOSIS — E1140 Type 2 diabetes mellitus with diabetic neuropathy, unspecified: Secondary | ICD-10-CM | POA: Diagnosis not present

## 2022-11-18 DIAGNOSIS — R7309 Other abnormal glucose: Secondary | ICD-10-CM | POA: Diagnosis not present

## 2022-11-18 DIAGNOSIS — R296 Repeated falls: Secondary | ICD-10-CM | POA: Diagnosis not present

## 2022-11-18 DIAGNOSIS — R790 Abnormal level of blood mineral: Secondary | ICD-10-CM | POA: Diagnosis not present

## 2022-11-18 DIAGNOSIS — G629 Polyneuropathy, unspecified: Secondary | ICD-10-CM | POA: Insufficient documentation

## 2022-11-18 DIAGNOSIS — I951 Orthostatic hypotension: Secondary | ICD-10-CM | POA: Insufficient documentation

## 2022-11-18 DIAGNOSIS — J849 Interstitial pulmonary disease, unspecified: Secondary | ICD-10-CM | POA: Diagnosis not present

## 2022-11-18 DIAGNOSIS — R784 Finding of other drugs of addictive potential in blood: Secondary | ICD-10-CM | POA: Diagnosis not present

## 2022-11-18 DIAGNOSIS — I700 Atherosclerosis of aorta: Secondary | ICD-10-CM | POA: Diagnosis not present

## 2022-11-18 DIAGNOSIS — G319 Degenerative disease of nervous system, unspecified: Secondary | ICD-10-CM | POA: Diagnosis not present

## 2022-11-18 DIAGNOSIS — Z1329 Encounter for screening for other suspected endocrine disorder: Secondary | ICD-10-CM | POA: Diagnosis not present

## 2022-11-18 HISTORY — DX: Disorientation, unspecified: R41.0

## 2022-11-18 MED ORDER — IOPAMIDOL (ISOVUE-370) INJECTION 76%
75.0000 mL | Freq: Once | INTRAVENOUS | Status: AC | PRN
  Administered 2022-11-18: 75 mL via INTRAVENOUS

## 2022-11-18 NOTE — Progress Notes (Signed)
Chief Complaint  Patient presents with   New Patient (Initial Visit)    Rm 15. Patient with wife Gus Height and has had two episodes were he is "out of it" and had to be woken up. And she also reports multiple falls and has had a lot of imaging done and everything came back normal as they know and he has not been able to drive, was told to be evaluated here first.       ASSESSMENT AND PLAN  Harry Zamora is a 82 y.o. male   Frequent fall  Today's examination demonstrates significant orthostatic hypotension, without corresponding heart rate increased, suggestive of autonomic failure  Also evidence of length-dependent sensory changes, can be related to his diabetes,  Laboratory evaluation to rule out other treatable etiology  EMG/NCS  Cognitive impairment  MRI of brain without contrast  DIAGNOSTIC DATA (LABS, IMAGING, TESTING) - I reviewed patient records, labs, notes, testing and imaging myself where available.   MEDICAL HISTORY:  Harry Zamora is a 82 year old male, accompanied by his wife, seen in request by his primary care physician Dr.   Evlyn Kanner, [facility] for evaluation of frequent falling, initial evaluation November 18, 2022  I reviewed and summarized the referring note. PMHX. HLD DM Restless leg Peripheral neuropathy Lumbar decompression CAD   He used to be very active, but become much sedentary since 2023, began to have frequent fall, most recent was a week ago, he sat in the sofa watching Western movie for 4 hours, around midnight, getting up to go to the bedroom, without warning signs, he fell to the ground, with his right scalp abrasion, wife was able to help him up he went to sleep afterwards,  All fall happened in the similar scenario, shortly after his stand up, no loss of consciousness, no bowel or bladder incontinence, sometimes proceeding by lower extremity quivering, if he has been helped sitting down, his symptoms will improve.  He also complains of gradual  onset bilateral lower extremity paresthesia, gait abnormality, began to use cane since 2024  He also complains intermittent sudden nausea, threw up episode over the past few years, does not happen often, but often times without clear triggers, intermittent constipations.  Over the past couple years, he had a gradual onset memory loss, he is a retired Company secretary, had a high school education, MoCA examination 20/30 today,  PHYSICAL EXAM:   Vitals:   11/18/22 1457 11/18/22 1507  BP: (!) 176/82 (!) 157/74  Pulse: 66 62  Weight: 180 lb (81.6 kg)   Height: 6\' 2"  (1.88 m)   Sitting down 167/78, HR 60;  Standing up 92/69, HR 64,  Standing up one minute 123/61, HR 65, Standing up 136/69, HR 64,    Body mass index is 23.11 kg/m.  PHYSICAL EXAMNIATION:  Gen: NAD, conversant, well nourised, well groomed                     Cardiovascular: Regular rate rhythm, no peripheral edema, warm, nontender. Eyes: Conjunctivae clear without exudates or hemorrhage Neck: Supple, no carotid bruits. Pulmonary: Clear to auscultation bilaterally   NEUROLOGICAL EXAM:  MENTAL STATUS: Speech/cognition: Awake, alert, oriented to history taking and casual conversation     11/18/2022    2:59 PM  Montreal Cognitive Assessment   Visuospatial/ Executive (0/5) 0  Naming (0/3) 3  Attention: Read list of digits (0/2) 2  Attention: Read list of letters (0/1) 1  Attention: Serial 7 subtraction starting at 100 (0/3) 3  Language: Repeat phrase (0/2) 1  Language : Fluency (0/1) 0  Abstraction (0/2) 2  Delayed Recall (0/5) 4  Orientation (0/6) 4  Total 20  Adjusted Score (based on education) 20    CRANIAL NERVES: CN II: Visual fields are full to confrontation. Pupils are round equal and briskly reactive to light. CN III, IV, VI: extraocular movement are normal. No ptosis. CN V: Facial sensation is intact to light touch CN VII: Face is symmetric with normal eye closure  CN VIII: Hearing is normal to causal  conversation. CN IX, X: Phonation is normal. CN XI: Head turning and shoulder shrug are intact  MOTOR: There is no pronator drift of out-stretched arms. Muscle bulk and tone are normal. Muscle strength is normal.  REFLEXES: Reflexes are 1  and symmetric at the biceps, triceps, knees, and absent at ankles. Plantar responses are flexor.  SENSORY: Length-dependent decreased to light touch, pinprick, vibratory sensation to midshin level  COORDINATION: There is no trunk or limb dysmetria noted.  GAIT/STANCE: Need push-up to get up from seated position, wide-based, cautious  REVIEW OF SYSTEMS:  Full 14 system review of systems performed and notable only for as above All other review of systems were negative.   ALLERGIES: Allergies  Allergen Reactions   Atorvastatin Other (See Comments)   Levaquin [Levofloxacin In D5w] Rash   Niaspan [Niacin Er] Rash and Other (See Comments)    Flushing reaction   Tramadol Rash    HOME MEDICATIONS: Current Outpatient Medications  Medication Sig Dispense Refill   ascorbic acid (VITAMIN C) 500 MG tablet Take 500 mg by mouth daily.     Calcium Carb-Cholecalciferol (CALCIUM 1000 + D PO) Take by mouth.     clopidogrel (PLAVIX) 75 MG tablet Take 1 tablet (75 mg total) by mouth daily. **Restart Plavix on 04/29/2021** 90 tablet 3   Continuous Blood Gluc Sensor (FREESTYLE LIBRE 3 SENSOR) MISC      DULoxetine (CYMBALTA) 30 MG capsule Take 30 mg by mouth daily.     Ferrous Sulfate Dried (HIGH POTENCY IRON) 65 MG TABS Take by mouth.     gabapentin (NEURONTIN) 100 MG capsule Take 100 mg by mouth at bedtime.     HYDROcodone-acetaminophen (NORCO) 5-325 MG tablet Take 1 tablet by mouth every 8 (eight) hours as needed for moderate pain. 10 tablet 0   lovastatin (MEVACOR) 40 MG tablet Take 40 mg by mouth daily.     meloxicam (MOBIC) 15 MG tablet Take 15 mg by mouth daily.     metFORMIN (GLUCOPHAGE) 500 MG tablet Take 2 tablets (1,000 mg total) by mouth 2 (two)  times daily with a meal. Resume 10/03/2020 (Patient taking differently: Take 500 mg by mouth 2 (two) times daily with a meal. Resume 10/03/2020)  1   metoprolol succinate (TOPROL-XL) 25 MG 24 hr tablet Take 25 mg by mouth daily.     MULTIPLE MINERALS PO Take by mouth.     nitroGLYCERIN (NITROSTAT) 0.4 MG SL tablet Place 1 tablet (0.4 mg total) under the tongue every 5 (five) minutes as needed for chest pain. 30 tablet 3   pregabalin (LYRICA) 75 MG capsule 1 capsule Oral TID     rOPINIRole (REQUIP) 4 MG tablet Take 4 mg by mouth at bedtime.     rosuvastatin (CRESTOR) 40 MG tablet TAKE 1 TABLET(40 MG) BY MOUTH DAILY 90 tablet 3   Semaglutide, 1 MG/DOSE, (OZEMPIC, 1 MG/DOSE,) 4 MG/3ML SOPN inject 1mg  Subcutaneous weekly     traMADol (ULTRAM) 50  MG tablet 1 tablet Oral Once a day     TRESIBA FLEXTOUCH 100 UNIT/ML FlexTouch Pen daily.     vitamin B-12 (CYANOCOBALAMIN) 500 MCG tablet Take 500 mcg by mouth daily.     No current facility-administered medications for this visit.    PAST MEDICAL HISTORY: Past Medical History:  Diagnosis Date   Anemia    as an infant   Arthritis    Back pain    Depression    Disc displacement, lumbar    Gynecomastia    History of kidney stones    Hyperlipidemia    Hypertension    no longer on medications   Hypertriglyceridemia    Insomnia    Low back pain    Lumbar radiculopathy    Lumbar stenosis    OA (osteoarthritis)    Polyneuropathy in diabetes(357.2)    Prostate cancer (HCC) 2003   Restless legs    Ringing in ears    RLS (restless legs syndrome)    Type 2 diabetes mellitus (HCC)     PAST SURGICAL HISTORY: Past Surgical History:  Procedure Laterality Date   ABDOMINAL EXPOSURE N/A 07/13/2019   Procedure: ABDOMINAL EXPOSURE;  Surgeon: Nada Libman, MD;  Location: [facility] OR;  Service: Vascular;  Laterality: N/A;  anterior approach   ANTERIOR LAT LUMBAR FUSION Right 07/13/2019   Procedure: Right Lumbar 3-4 Lumbar 4-5 Anterolateral lumbar interbody  fusion;  Surgeon: Maeola Harman, MD;  Location: [facility] OR;  Service: Neurosurgery;  Laterality: Right;   ANTERIOR LUMBAR FUSION N/A 07/13/2019   Procedure: Lumbar Five-Sacral One Anterior lumbar interbody fusion;  Surgeon: Maeola Harman, MD;  Location: [facility] OR;  Service: Neurosurgery;  Laterality: N/A;  Anterior approach   APPLICATION OF INTRAOPERATIVE CT SCAN N/A 04/24/2021   Procedure: APPLICATION OF INTRAOPERATIVE CT SCAN;  Surgeon: Dawley, Alan Mulder, DO;  Location: [facility] OR;  Service: Neurosurgery;  Laterality: N/A;  3C/RM 21   CHOLECYSTECTOMY     COLONOSCOPY     CORONARY STENT INTERVENTION N/A 10/01/2020   Procedure: CORONARY STENT INTERVENTION;  Surgeon: Elder Negus, MD;  Location: [facility] INVASIVE CV LAB;  Service: Cardiovascular;  Laterality: N/A;   EYE SURGERY Bilateral    GALLBLADDER SURGERY     HEMORRHOID SURGERY  1983   LEFT HEART CATH AND CORONARY ANGIOGRAPHY N/A 09/17/2020   Procedure: LEFT HEART CATH AND CORONARY ANGIOGRAPHY;  Surgeon: Elder Negus, MD;  Location: [facility] INVASIVE CV LAB;  Service: Cardiovascular;  Laterality: N/A;   LUMBAR LAMINECTOMY/DECOMPRESSION MICRODISCECTOMY N/A 10/02/2016   Procedure: L3 to S1 Laminectomy;  Surgeon: Maeola Harman, MD;  Location: [facility] OR;  Service: Neurosurgery;  Laterality: N/A;  L3 to S1 Laminectomy   LUMBAR LAMINECTOMY/DECOMPRESSION MICRODISCECTOMY Bilateral 09/07/2019   Procedure: Bilateral Lumbar Five - Sacral One Foraminotomy;  Surgeon: Maeola Harman, MD;  Location: [facility] OR;  Service: Neurosurgery;  Laterality: Bilateral;  posterior   LUMBAR PERCUTANEOUS PEDICLE SCREW 3 LEVEL N/A 07/13/2019   Procedure: Percutaneous pedicle screw fixation from Lumbar 3 to Sacral 1;  Surgeon: Maeola Harman, MD;  Location: [facility] OR;  Service: Neurosurgery;  Laterality: N/A;   MENISCUS REPAIR Right    PROSTATE SURGERY     SHOULDER SURGERY Left 01/2011   rotator cuff    FAMILY HISTORY: Family History  Problem Relation Age of Onset   Arthritis Mother    Hypertension  Mother    Diabetes Mother    Heart attack Mother    Cancer Father     SOCIAL HISTORY: Social History  Socioeconomic History   Marital status: Married    Spouse name: Jola Babinski   Number of children: 3   Years of education: 12   Highest education level: Not on file  Occupational History   Occupation: Chief Strategy Officer: [employer]  Tobacco Use   Smoking status: Some Days    Types: Cigars   Smokeless tobacco: Never   Tobacco comments:    occasional  Vaping Use   Vaping status: Never Used  Substance and Sexual Activity   Alcohol use: Not Currently    Comment: social-beer   Drug use: No   Sexual activity: Not on file  Other Topics Concern   Not on file  Social History Narrative   Patient is married Jola Babinski) and lives at home with his wife.   Patient has three children and two- step children.   Patient is working full-time.   Patient has a high school education.   Patient is right-handed.   Patient drinks two cups of coffee, 2-3 sodas and one cup of tea daily.   Social Determinants of Health   Financial Resource Strain: Not on file  Food Insecurity: Not on file  Transportation Needs: Not on file  Physical Activity: Not on file  Stress: Not on file  Social Connections: Not on file  Intimate Partner Violence: Not on file      Levert Feinstein, M.D. Ph.D.  [facility] [address] [place], [place] [address] Ph: [phone] Fax: [phone]  CC:  Derwood Kaplan, MD [address] [place],  [place] [address]  Adrian Prince, MD

## 2022-11-20 ENCOUNTER — Telehealth: Payer: Self-pay | Admitting: Neurology

## 2022-11-20 NOTE — Telephone Encounter (Signed)
[employer] auth pending Tracking [id number] for GI. Need to upload notes.

## 2022-11-23 LAB — VITAMIN B12: Vitamin B-12: 1249 pg/mL — ABNORMAL HIGH (ref 232–1245)

## 2022-11-23 LAB — IRON,TIBC AND FERRITIN PANEL
Ferritin: 253 ng/mL (ref 30–400)
Iron Saturation: 20 % (ref 15–55)
Iron: 69 ug/dL (ref 38–169)
Total Iron Binding Capacity: 350 ug/dL (ref 250–450)
UIBC: 281 ug/dL (ref 111–343)

## 2022-11-23 LAB — MULTIPLE MYELOMA PANEL, SERUM
Albumin SerPl Elph-Mcnc: 3.8 g/dL (ref 2.9–4.4)
Albumin/Glob SerPl: 1.1 (ref 0.7–1.7)
Alpha 1: 0.3 g/dL (ref 0.0–0.4)
Alpha2 Glob SerPl Elph-Mcnc: 1 g/dL (ref 0.4–1.0)
B-Globulin SerPl Elph-Mcnc: 1.2 g/dL (ref 0.7–1.3)
Gamma Glob SerPl Elph-Mcnc: 0.9 g/dL (ref 0.4–1.8)
Globulin, Total: 3.5 g/dL (ref 2.2–3.9)
IgA/Immunoglobulin A, Serum: 308 mg/dL (ref 61–437)
IgG (Immunoglobin G), Serum: 997 mg/dL (ref 603–1613)
IgM (Immunoglobulin M), Srm: 66 mg/dL (ref 15–143)
Total Protein: 7.3 g/dL (ref 6.0–8.5)

## 2022-11-23 LAB — TSH: TSH: 1.29 u[IU]/mL (ref 0.450–4.500)

## 2022-11-23 LAB — HGB A1C W/O EAG: Hgb A1c MFr Bld: 7.5 % — ABNORMAL HIGH (ref 4.8–5.6)

## 2022-11-23 LAB — CK: Total CK: 78 U/L (ref 30–208)

## 2022-11-23 LAB — RPR: RPR Ser Ql: NONREACTIVE

## 2022-11-23 LAB — C-REACTIVE PROTEIN: CRP: 1 mg/L (ref 0–10)

## 2022-11-23 LAB — SEDIMENTATION RATE: Sed Rate: 6 mm/h (ref 0–30)

## 2022-11-23 LAB — ANA W/REFLEX IF POSITIVE: Anti Nuclear Antibody (ANA): NEGATIVE

## 2022-11-23 LAB — FOLATE: Folate: >20 ng/mL (ref >3.0)

## 2022-11-23 NOTE — Telephone Encounter (Signed)
Notes uploaded for medical review.

## 2022-11-25 NOTE — Telephone Encounter (Signed)
[employer] Berkley Harvey: [id number] exp. 11/20/22-01/19/23 sent to GI [phone]

## 2022-11-26 DIAGNOSIS — I10 Essential (primary) hypertension: Secondary | ICD-10-CM | POA: Diagnosis not present

## 2022-11-26 DIAGNOSIS — G629 Polyneuropathy, unspecified: Secondary | ICD-10-CM | POA: Diagnosis not present

## 2022-11-26 DIAGNOSIS — K760 Fatty (change of) liver, not elsewhere classified: Secondary | ICD-10-CM | POA: Diagnosis not present

## 2022-11-26 DIAGNOSIS — Z794 Long term (current) use of insulin: Secondary | ICD-10-CM | POA: Diagnosis not present

## 2022-11-26 DIAGNOSIS — E1140 Type 2 diabetes mellitus with diabetic neuropathy, unspecified: Secondary | ICD-10-CM | POA: Diagnosis not present

## 2022-11-26 DIAGNOSIS — N1831 Chronic kidney disease, stage 3a: Secondary | ICD-10-CM | POA: Diagnosis not present

## 2022-11-26 DIAGNOSIS — I25110 Atherosclerotic heart disease of native coronary artery with unstable angina pectoris: Secondary | ICD-10-CM | POA: Diagnosis not present

## 2022-11-26 DIAGNOSIS — R200 Anesthesia of skin: Secondary | ICD-10-CM | POA: Diagnosis not present

## 2022-11-30 ENCOUNTER — Ambulatory Visit: Payer: Medicare HMO | Admitting: Surgery

## 2022-12-01 ENCOUNTER — Encounter: Payer: Self-pay | Admitting: Physical Therapy

## 2022-12-01 ENCOUNTER — Ambulatory Visit: Payer: Medicare HMO | Admitting: Neurology

## 2022-12-01 ENCOUNTER — Other Ambulatory Visit: Payer: Self-pay

## 2022-12-01 ENCOUNTER — Ambulatory Visit: Payer: Medicare HMO | Attending: Neurology | Admitting: Physical Therapy

## 2022-12-01 VITALS — BP 114/54 | HR 77

## 2022-12-01 DIAGNOSIS — R269 Unspecified abnormalities of gait and mobility: Secondary | ICD-10-CM | POA: Diagnosis not present

## 2022-12-01 DIAGNOSIS — I951 Orthostatic hypotension: Secondary | ICD-10-CM | POA: Diagnosis not present

## 2022-12-01 DIAGNOSIS — R296 Repeated falls: Secondary | ICD-10-CM | POA: Insufficient documentation

## 2022-12-01 DIAGNOSIS — R410 Disorientation, unspecified: Secondary | ICD-10-CM | POA: Insufficient documentation

## 2022-12-01 DIAGNOSIS — M6281 Muscle weakness (generalized): Secondary | ICD-10-CM | POA: Diagnosis not present

## 2022-12-01 DIAGNOSIS — R2689 Other abnormalities of gait and mobility: Secondary | ICD-10-CM | POA: Insufficient documentation

## 2022-12-01 DIAGNOSIS — R2681 Unsteadiness on feet: Secondary | ICD-10-CM | POA: Diagnosis not present

## 2022-12-01 DIAGNOSIS — G6289 Other specified polyneuropathies: Secondary | ICD-10-CM | POA: Insufficient documentation

## 2022-12-01 DIAGNOSIS — R4182 Altered mental status, unspecified: Secondary | ICD-10-CM | POA: Diagnosis not present

## 2022-12-01 NOTE — Therapy (Unsigned)
OUTPATIENT PHYSICAL THERAPY NEURO EVALUATION   Patient Name: Harry Zamora MRN: [id number] DOB:October 12, 1940, 82 y.o., male Today's Date: 12/02/2022   PCP: Adrian Prince, MD REFERRING PROVIDER: Levert Feinstein, MD  END OF SESSION:  PT End of Session - 12/01/22 1201     Visit Number 1    Number of Visits 5   4 + eval   Date for PT Re-Evaluation 01/08/23   pushed out due to scheduling delay   Authorization Type [employer]ICARE    Progress Note Due on Visit 10    PT Start Time 1150    PT Stop Time 1231    PT Time Calculation (min) 41 min    Equipment Utilized During Treatment Gait belt    Behavior During Therapy WFL for tasks assessed/performed             Past Medical History:  Diagnosis Date   Anemia    as an infant   Arthritis    Back pain    Depression    Disc displacement, lumbar    Gynecomastia    History of kidney stones    Hyperlipidemia    Hypertension    no longer on medications   Hypertriglyceridemia    Insomnia    Low back pain    Lumbar radiculopathy    Lumbar stenosis    OA (osteoarthritis)    Polyneuropathy in diabetes(357.2)    Prostate cancer (HCC) 2003   Restless legs    Ringing in ears    RLS (restless legs syndrome)    Type 2 diabetes mellitus (HCC)    Past Surgical History:  Procedure Laterality Date   ABDOMINAL EXPOSURE N/A 07/13/2019   Procedure: ABDOMINAL EXPOSURE;  Surgeon: Nada Libman, MD;  Location: [facility] OR;  Service: Vascular;  Laterality: N/A;  anterior approach   ANTERIOR LAT LUMBAR FUSION Right 07/13/2019   Procedure: Right Lumbar 3-4 Lumbar 4-5 Anterolateral lumbar interbody fusion;  Surgeon: Maeola Harman, MD;  Location: [facility] OR;  Service: Neurosurgery;  Laterality: Right;   ANTERIOR LUMBAR FUSION N/A 07/13/2019   Procedure: Lumbar Five-Sacral One Anterior lumbar interbody fusion;  Surgeon: Maeola Harman, MD;  Location: [facility] OR;  Service: Neurosurgery;  Laterality: N/A;  Anterior approach   APPLICATION OF INTRAOPERATIVE CT SCAN N/A  04/24/2021   Procedure: APPLICATION OF INTRAOPERATIVE CT SCAN;  Surgeon: Dawley, Alan Mulder, DO;  Location: [facility] OR;  Service: Neurosurgery;  Laterality: N/A;  3C/RM 21   CHOLECYSTECTOMY     COLONOSCOPY     CORONARY STENT INTERVENTION N/A 10/01/2020   Procedure: CORONARY STENT INTERVENTION;  Surgeon: Elder Negus, MD;  Location: [facility] INVASIVE CV LAB;  Service: Cardiovascular;  Laterality: N/A;   EYE SURGERY Bilateral    GALLBLADDER SURGERY     HEMORRHOID SURGERY  1983   LEFT HEART CATH AND CORONARY ANGIOGRAPHY N/A 09/17/2020   Procedure: LEFT HEART CATH AND CORONARY ANGIOGRAPHY;  Surgeon: Elder Negus, MD;  Location: [facility] INVASIVE CV LAB;  Service: Cardiovascular;  Laterality: N/A;   LUMBAR LAMINECTOMY/DECOMPRESSION MICRODISCECTOMY N/A 10/02/2016   Procedure: L3 to S1 Laminectomy;  Surgeon: Maeola Harman, MD;  Location: [facility] OR;  Service: Neurosurgery;  Laterality: N/A;  L3 to S1 Laminectomy   LUMBAR LAMINECTOMY/DECOMPRESSION MICRODISCECTOMY Bilateral 09/07/2019   Procedure: Bilateral Lumbar Five - Sacral One Foraminotomy;  Surgeon: Maeola Harman, MD;  Location: [facility] OR;  Service: Neurosurgery;  Laterality: Bilateral;  posterior   LUMBAR PERCUTANEOUS PEDICLE SCREW 3 LEVEL N/A 07/13/2019   Procedure: Percutaneous pedicle screw fixation  from Lumbar 3 to Sacral 1;  Surgeon: Maeola Harman, MD;  Location: [facility] OR;  Service: Neurosurgery;  Laterality: N/A;   MENISCUS REPAIR Right    PROSTATE SURGERY     SHOULDER SURGERY Left 01/2011   rotator cuff   Patient Active Problem List   Diagnosis Date Noted   Orthostatic hypotension 11/18/2022   Gait abnormality 11/18/2022   Peripheral neuropathy 11/18/2022   Confusion 11/18/2022   Unilateral primary osteoarthritis, left knee 10/01/2021   Postural dizziness with presyncope 08/04/2021   Lumbar spinal stenosis 04/24/2021   Sepsis (HCC) 03/13/2021   AKI (acute kidney injury) (HCC) 03/13/2021   Elevated CK 03/13/2021   Severe sepsis (HCC) 03/13/2021   PNA  (pneumonia) 03/12/2021   Restless legs    Acute respiratory failure with hypoxia (HCC)    Coronary artery disease involving native coronary artery of native heart without angina pectoris 09/19/2020   Abnormal stress test 09/16/2020   Hyperkalemia 09/12/2020   Acute renal failure 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) 09/12/2020   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   Type 2 diabetes mellitus (HCC)    Syncope and collapse 08/07/2020   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   PAD (peripheral artery disease) (HCC) 08/30/2019   Spondylolisthesis of lumbar region 07/13/2019   Spinal stenosis of lumbar region with neurogenic claudication 10/02/2016   Diabetic polyneuropathy (HCC) 11/10/2013    ONSET DATE: 4 months ago (May 2024)  REFERRING DIAG: I95.1 (ICD-10-CM) - Orthostatic hypotension R26.9 (ICD-10-CM) - Gait abnormality G62.89 (ICD-10-CM) - Other polyneuropathy R41.0 (ICD-10-CM) - Confusion  THERAPY DIAG:  Muscle weakness (generalized)  Unsteadiness on feet  Other abnormalities of gait and mobility  Repeated falls  Rationale for Evaluation and Treatment: Rehabilitation  SUBJECTIVE:                                                                                                                                                                                             SUBJECTIVE STATEMENT: Pt states he first noticed his imbalance following a fall in May 2024.  He denies hitting head, but has had a fall since (last Saturday per report, but per imaging this occurred before 8/19 and pt also had pneumonia at the time) that resulted in hitting head and he went to urgent care for this and the scrape on his head is healing (PT had pt remove hat to assess - does not appear infected, small red scab).  He ambulates using Hurrycane.  Sometimes he goes without an AD, but typically only at home. Pt accompanied by: significant other - Wife [place] drove him  today  PERTINENT HISTORY: Polyneuropathy, lumbar spinal stenosis, PAD, Stage 3a CKD, DM2, CAD, left knee OA, orthostatic hypotension  11/18/2022 MoCA 20/30 (mild cognitive impairment)  PAIN:  Are you having pain? No  PRECAUTIONS: Fall  RED FLAGS: None   WEIGHT BEARING RESTRICTIONS: No  FALLS: Has patient fallen in last 6 months? Yes. Number of falls 2 - 1 about a month ago and 1 in May  LIVING ENVIRONMENT: Lives with: lives with their spouse Lives in: House/apartment Stairs: No Has following equipment at home: Single point cane, Environmental consultant - 4 wheeled, shower chair, and Grab bars  PLOF: Independent - sometimes modI in community w/ cane  PATIENT GOALS: "To see how I'm doing, I'm doing this for my wife, but don't feel like I need it."  OBJECTIVE:  Blood glucose:  144  Vitals: Today's Vitals   12/01/22 1152  BP: (!) 114/54  Pulse: 77   Second vitals in standing: 81/35 on LUE, HR 81 bpm  DIAGNOSTIC FINDINGS:  All head and facial imaging from 8/19 WNL.    Pt has had recent heart monitor, but no results available to PT at this time and pt unsure of results.  COGNITION: Overall cognitive status: Within functional limits for tasks assessed   SENSATION: WFL Light touch: pt reports abnormal testing at neurologist - mid-shin before he could discriminate  COORDINATION: LE RAMS:  WNL Bilateral heel-to-shin:  WNL  EDEMA:  None noted in BLE - intermittent left knee swelling secondary to OA  MUSCLE TONE: None noted in BLE  POSTURE: forward head, left pelvic obliquity, and weight shift left  LOWER EXTREMITY ROM:     Active  Right Eval Left Eval  Hip flexion WNL WNL  Hip extension    Hip abduction " "  Hip adduction " "  Hip internal rotation    Hip external rotation    Knee flexion    Knee extension Lacks ~4-5 degrees of active extension/chronic flexion WNL  Ankle dorsiflexion    Ankle plantarflexion    Ankle inversion    Ankle eversion     (Blank rows = not  tested)  LOWER EXTREMITY MMT:    MMT Right Eval Left Eval  Hip flexion 5/5 5/5  Hip extension    Hip abduction 4/5 4/5  Hip adduction 4/5 4/5  Hip internal rotation    Hip external rotation    Knee flexion 4+/5 4+/5  Knee extension 4/5 4+/5  Ankle dorsiflexion 4+/5 4+/5  Ankle plantarflexion    Ankle inversion    Ankle eversion    (Blank rows = not tested)  BED MOBILITY:  Pt reports independence.  TRANSFERS: Assistive device utilized: Single point cane  Sit to stand: Modified independence Stand to sit: Modified independence Chair to chair: Modified independence Pt needs increased time to safely complete.   GAIT: Gait pattern: step through pattern and decreased stance time- Left Distance walked: various clinic distances Assistive device utilized:  Hurrycane Level of assistance: Modified independence Comments: No LOB, no noted scuff, adequate foot clearance, some maintained drawn in posture of left hand, mildly diminished left arm swing.  FUNCTIONAL TESTS:  5 times sit to stand: 12.25 seconds no UE support; no reported dizziness with movement. Timed up and go (TUG): 11.44 seconds w/ Hurrycane 10 meter walk test: 9.50 seconds w/ Hurrycane = 1.05 m/sec OR 3.47 ft/sec Berg Balance Scale: To be assessed.  PATIENT SURVEYS:  ABC scale To be assessed.  TODAY'S TREATMENT:  DATE: N/A - eval only.   PATIENT EDUCATION: Education details: PT POC, assessments used and to be used, and goals to be set.  BP parameters for upright PT, blood glucose parameters for PT. Person educated: Patient Education method: Explanation Education comprehension: verbalized understanding and needs further education  HOME EXERCISE PROGRAM: To be established.  GOALS: Goals reviewed with patient? Yes  SHORT TERM GOALS = LONG TERM GOALS: Target date: 01/01/2023  Pt will  be independent and compliant with finalized strength and balance HEP in order to maintain functional progress and improve mobility. Baseline:  To be established. Goal status: INITIAL  2.  Pt will demonstrate a gait speed of >3.67 feet/sec in order to decrease risk for falls. Baseline: 3.47 ft/sec Goal status: INITIAL  3.  BERG to be assessed w/ goal set as appropriate. Baseline:  To be assessed. Goal status: INITIAL  4.  ABC Scale to be assessed w/ goal set as appropriate. Baseline:  To be assessed. Goal status: INITIAL  ASSESSMENT:  CLINICAL IMPRESSION: Patient is a 82 y.o. male who was seen today for physical therapy evaluation and treatment for gait instability and general imbalance.  Pt has a significant PMH of polyneuropathy, lumbar spinal stenosis, PAD, Stage 3a CKD, DM2, CAD, left knee OA, and orthostatic hypotension.  Identified impairments include recent history of falls, orthostatic vitals, fwd head posture and left weight shift in sitting, decreased right active knee extension, mild BLE weakness, and decreased left stance during ambulation.  Evaluation via the following assessment tools: 5xSTS and TUG did not indicate elevated fall risk.  indicates some elevated fall risk in combination with orthostatics and history of repeated falls.  He would benefit from skilled PT to address impairments as noted and progress towards long term goals.  OBJECTIVE IMPAIRMENTS: Abnormal gait, cardiopulmonary status limiting activity, decreased activity tolerance, decreased balance, decreased endurance, decreased knowledge of condition, decreased knowledge of use of DME, difficulty walking, decreased ROM, decreased strength, impaired flexibility, improper body mechanics, and postural dysfunction.   ACTIVITY LIMITATIONS: carrying, lifting, bending, standing, and locomotion level  PARTICIPATION LIMITATIONS: cleaning, laundry, driving, community activity, and yard work  PERSONAL FACTORS: Age,  Fitness, Time since onset of injury/illness/exacerbation, and 1-2 comorbidities: polyneuropathy, orthostatic hypotension  are also affecting patient's functional outcome.   REHAB POTENTIAL: Good  CLINICAL DECISION MAKING: Evolving/moderate complexity  EVALUATION COMPLEXITY: Moderate  PLAN:  PT FREQUENCY: 1x/week  PT DURATION: 4 weeks (pt preference)  PLANNED INTERVENTIONS: Therapeutic exercises, Therapeutic activity, Neuromuscular re-education, Balance training, Gait training, Patient/Family education, Self Care, Joint mobilization, Stair training, Vestibular training, DME instructions, Manual therapy, and Re-evaluation  PLAN FOR NEXT SESSION: BERG, ABC scale - set goals.  Initiate HEP for strength and balance.  Gait training/stair training.  Sadie Haber, PT, DPT 12/02/2022, 1:23 PM

## 2022-12-02 ENCOUNTER — Encounter: Payer: Self-pay | Admitting: Surgery

## 2022-12-02 ENCOUNTER — Ambulatory Visit: Payer: Medicare HMO | Admitting: Surgery

## 2022-12-02 VITALS — BP 130/63 | HR 71 | Resp 20 | Ht 74.0 in | Wt 179.0 lb

## 2022-12-02 DIAGNOSIS — I719 Aortic aneurysm of unspecified site, without rupture: Secondary | ICD-10-CM | POA: Diagnosis not present

## 2022-12-02 NOTE — Progress Notes (Signed)
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 This was found incidentally on CT of the chest 09/24/2020 when he was admitted with sepsis due to community-acquired pneumonia and RSV infection. He recovered from that and subsequently underwent lumbar spine surgery and February 2023.   He continues to feel well without chest pain.  He was treated for left lung pneumonia in August 2024 but has made a good recovery from that.  Current Outpatient Medications  Medication Sig Dispense Refill   ascorbic acid (VITAMIN C) 500 MG tablet Take 500 mg by mouth daily.     Calcium Carb-Cholecalciferol (CALCIUM 1000 + D PO) Take by mouth.     clopidogrel (PLAVIX) 75 MG tablet Take 1 tablet (75 mg total) by mouth daily. **Restart Plavix on 04/29/2021** 90 tablet 3   Continuous Blood Gluc Sensor (FREESTYLE LIBRE 3 SENSOR) MISC      DULoxetine (CYMBALTA) 30 MG capsule Take 30 mg by mouth daily.     Ferrous Sulfate Dried (HIGH POTENCY IRON) 65 MG TABS Take by mouth.     gabapentin (NEURONTIN) 100 MG capsule Take 100 mg by mouth at bedtime.     HYDROcodone-acetaminophen (NORCO) 5-325 MG tablet Take 1 tablet by mouth every 8 (eight) hours as needed for moderate pain. 10 tablet 0   lovastatin (MEVACOR) 40 MG tablet Take 40 mg by mouth daily.     meloxicam (MOBIC) 15 MG tablet Take 15 mg by mouth daily.     metFORMIN (GLUCOPHAGE) 500 MG tablet Take 2 tablets (1,000 mg total) by mouth 2 (two) times daily with a meal. Resume 10/03/2020 (Patient taking differently: Take 500 mg by mouth 2 (two) times daily with a meal. Resume 10/03/2020)  1   MULTIPLE MINERALS PO Take by mouth.     nitroGLYCERIN (NITROSTAT) 0.4 MG SL tablet Place 1 tablet (0.4 mg total) under the tongue every 5 (five) minutes as needed for chest pain. 30 tablet 3   rOPINIRole (REQUIP) 4 MG tablet Take 4 mg by mouth at bedtime.     rosuvastatin (CRESTOR) 40 MG tablet TAKE 1  TABLET(40 MG) BY MOUTH DAILY 90 tablet 3   Semaglutide, 1 MG/DOSE, (OZEMPIC, 1 MG/DOSE,) 4 MG/3ML SOPN inject 1mg  Subcutaneous weekly     traMADol (ULTRAM) 50 MG tablet 1 tablet Oral Once a day     TRESIBA FLEXTOUCH 100 UNIT/ML FlexTouch Pen daily.     vitamin B-12 (CYANOCOBALAMIN) 500 MCG tablet Take 500 mcg by mouth daily.     metoprolol succinate (TOPROL-XL) 25 MG 24 hr tablet Take 25 mg by mouth daily. (Patient not taking: Reported on 12/02/2022)     pregabalin (LYRICA) 75 MG capsule 1 capsule Oral TID     No current facility-administered medications for this visit.     Physical Exam: BP 130/63   Pulse 71   Resp 20   Ht 6\' 2"  (1.88 m)   Wt 179 lb (81.2 kg)   SpO2 95% Comment: RA  BMI 22.98 kg/m  He looks well. Cardiac exam shows regular rate and rhythm with normal heart sounds.  There is no murmur. Lungs are clear.  Diagnostic Tests:  Narrative & Impression  CLINICAL DATA:  Thoracic aortic aneurysm.   EXAM: CT ANGIOGRAPHY CHEST WITH CONTRAST   TECHNIQUE: Multidetector CT imaging of the chest was performed using the standard protocol during bolus administration of intravenous contrast. Multiplanar CT image reconstructions and MIPs were  obtained to evaluate the vascular anatomy.   RADIATION DOSE REDUCTION: This exam was performed according to the departmental dose-optimization program which includes automated exposure control, adjustment of the mA and/or kV according to patient size and/or use of iterative reconstruction technique.   CONTRAST:  75mL ISOVUE-370 IOPAMIDOL (ISOVUE-370) INJECTION 76%   COMPARISON:  November 01, 2022.   FINDINGS: Cardiovascular: Atherosclerosis of thoracic aorta is noted without aneurysm or dissection. Ascending thoracic aorta has maximum measured diameter of 3.7 cm. Normal cardiac size. No pericardial effusion. Coronary artery calcifications are noted.   Mediastinum/Nodes: No enlarged mediastinal, hilar, or axillary lymph nodes.  Thyroid gland, trachea, and esophagus demonstrate no significant findings.   Lungs/Pleura: No pneumothorax or pleural effusion is noted. Minimal left posterior basilar subsegmental atelectasis or infiltrate is noted. Small ground-glass opacities are noted anteriorly in left upper lobe most consistent with focal inflammation, with the largest measuring approximately 7 mm best seen on image number 76 of series 11.   Upper Abdomen: No acute abnormality.   Musculoskeletal: No chest wall abnormality. No acute or significant osseous findings.   Review of the MIP images confirms the above findings.   IMPRESSION: No evidence of thoracic aortic dissection or aneurysm.   Minimal left posterior basilar subsegmental atelectasis or infiltrate is noted.   Cluster of small ground-glass opacities are noted anteriorly in left upper lobe most consistent with focal inflammation, with the largest measuring approximately 7 mm. Non-contrast chest CT at 3-6 months is recommended. If nodules persist, subsequent management will be based upon the most suspicious nodule(s). This recommendation follows the consensus statement: Guidelines for Management of Incidental Pulmonary Nodules Detected on CT Images: From the Fleischner Society 2017; Radiology 2017; 284:228-243.   Coronary artery calcifications are noted.   Aortic Atherosclerosis (ICD10-I70.0).     Electronically Signed   By: Lupita Raider M.D.   On: 11/18/2022 15:02      Impression:  His ascending aorta has maximum diameter of 3.7 cm which is within normal limits.  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  There is a cluster of small groundglass opacities in the anterior left upper lobe that is most likely focal inflammation and could be related to his recent left lung pneumonia.  The largest measures about 7 mm.  These are new.  I reviewed the CT images with him and answered his questions.  Radiology  recommended repeating his noncontrast CT of the chest in 3 to 6 months for completeness sake.   Plan:  I will see him back in 6 months with a CT scan of the chest without contrast for follow-up of the left upper lobe lung nodules.  I spent 15 minutes performing this established patient evaluation and > 50% of this time was spent face to face counseling and coordinating the care of this patient's penetrating ulcer of the aortic arch and new left upper lobe lung nodules.    Alleen Borne, MD [facility] [phone]

## 2022-12-03 ENCOUNTER — Ambulatory Visit: Payer: Medicare PPO | Admitting: Neurology

## 2022-12-11 ENCOUNTER — Ambulatory Visit: Payer: Medicare HMO | Admitting: Physical Therapy

## 2022-12-11 ENCOUNTER — Encounter: Payer: Self-pay | Admitting: Physical Therapy

## 2022-12-11 VITALS — BP 121/54 | HR 71

## 2022-12-11 DIAGNOSIS — R2681 Unsteadiness on feet: Secondary | ICD-10-CM

## 2022-12-11 DIAGNOSIS — R269 Unspecified abnormalities of gait and mobility: Secondary | ICD-10-CM | POA: Diagnosis not present

## 2022-12-11 DIAGNOSIS — R296 Repeated falls: Secondary | ICD-10-CM | POA: Diagnosis not present

## 2022-12-11 DIAGNOSIS — R410 Disorientation, unspecified: Secondary | ICD-10-CM | POA: Diagnosis not present

## 2022-12-11 DIAGNOSIS — M6281 Muscle weakness (generalized): Secondary | ICD-10-CM

## 2022-12-11 DIAGNOSIS — I951 Orthostatic hypotension: Secondary | ICD-10-CM | POA: Diagnosis not present

## 2022-12-11 DIAGNOSIS — R2689 Other abnormalities of gait and mobility: Secondary | ICD-10-CM | POA: Diagnosis not present

## 2022-12-11 DIAGNOSIS — G6289 Other specified polyneuropathies: Secondary | ICD-10-CM | POA: Diagnosis not present

## 2022-12-11 NOTE — Therapy (Unsigned)
OUTPATIENT PHYSICAL THERAPY NEURO TREATMENT   Patient Name: Harry Zamora MRN: [id number] DOB:Aug 23, 1940, 82 y.o., male Today's Date: 12/11/2022  PCP: Adrian Prince, MD REFERRING PROVIDER: Levert Feinstein, MD  END OF SESSION:  PT End of Session - 12/11/22 1104     Visit Number 2    Number of Visits 5   4 + eval   Date for PT Re-Evaluation 01/08/23   pushed out due to scheduling delay   Authorization Type [employer]ICARE    Progress Note Due on Visit 10    PT Start Time 1101    PT Stop Time 1154    PT Time Calculation (min) 53 min    Equipment Utilized During Treatment Gait belt    Activity Tolerance Patient tolerated treatment well    Behavior During Therapy WFL for tasks assessed/performed             Past Medical History:  Diagnosis Date   Anemia    as an infant   Arthritis    Back pain    Depression    Disc displacement, lumbar    Gynecomastia    History of kidney stones    Hyperlipidemia    Hypertension    no longer on medications   Hypertriglyceridemia    Insomnia    Low back pain    Lumbar radiculopathy    Lumbar stenosis    OA (osteoarthritis)    Polyneuropathy in diabetes(357.2)    Prostate cancer (HCC) 2003   Restless legs    Ringing in ears    RLS (restless legs syndrome)    Type 2 diabetes mellitus (HCC)    Past Surgical History:  Procedure Laterality Date   ABDOMINAL EXPOSURE N/A 07/13/2019   Procedure: ABDOMINAL EXPOSURE;  Surgeon: Nada Libman, MD;  Location: [facility] OR;  Service: Vascular;  Laterality: N/A;  anterior approach   ANTERIOR LAT LUMBAR FUSION Right 07/13/2019   Procedure: Right Lumbar 3-4 Lumbar 4-5 Anterolateral lumbar interbody fusion;  Surgeon: Maeola Harman, MD;  Location: [facility] OR;  Service: Neurosurgery;  Laterality: Right;   ANTERIOR LUMBAR FUSION N/A 07/13/2019   Procedure: Lumbar Five-Sacral One Anterior lumbar interbody fusion;  Surgeon: Maeola Harman, MD;  Location: [facility] OR;  Service: Neurosurgery;  Laterality: N/A;  Anterior  approach   APPLICATION OF INTRAOPERATIVE CT SCAN N/A 04/24/2021   Procedure: APPLICATION OF INTRAOPERATIVE CT SCAN;  Surgeon: Dawley, Alan Mulder, DO;  Location: [facility] OR;  Service: Neurosurgery;  Laterality: N/A;  3C/RM 21   CHOLECYSTECTOMY     COLONOSCOPY     CORONARY STENT INTERVENTION N/A 10/01/2020   Procedure: CORONARY STENT INTERVENTION;  Surgeon: Elder Negus, MD;  Location: [facility] INVASIVE CV LAB;  Service: Cardiovascular;  Laterality: N/A;   EYE SURGERY Bilateral    GALLBLADDER SURGERY     HEMORRHOID SURGERY  1983   LEFT HEART CATH AND CORONARY ANGIOGRAPHY N/A 09/17/2020   Procedure: LEFT HEART CATH AND CORONARY ANGIOGRAPHY;  Surgeon: Elder Negus, MD;  Location: [facility] INVASIVE CV LAB;  Service: Cardiovascular;  Laterality: N/A;   LUMBAR LAMINECTOMY/DECOMPRESSION MICRODISCECTOMY N/A 10/02/2016   Procedure: L3 to S1 Laminectomy;  Surgeon: Maeola Harman, MD;  Location: [facility] OR;  Service: Neurosurgery;  Laterality: N/A;  L3 to S1 Laminectomy   LUMBAR LAMINECTOMY/DECOMPRESSION MICRODISCECTOMY Bilateral 09/07/2019   Procedure: Bilateral Lumbar Five - Sacral One Foraminotomy;  Surgeon: Maeola Harman, MD;  Location: [facility] OR;  Service: Neurosurgery;  Laterality: Bilateral;  posterior   LUMBAR PERCUTANEOUS PEDICLE SCREW 3 LEVEL N/A  07/13/2019   Procedure: Percutaneous pedicle screw fixation from Lumbar 3 to Sacral 1;  Surgeon: Maeola Harman, MD;  Location: [facility] OR;  Service: Neurosurgery;  Laterality: N/A;   MENISCUS REPAIR Right    PROSTATE SURGERY     SHOULDER SURGERY Left 01/2011   rotator cuff   Patient Active Problem List   Diagnosis Date Noted   Orthostatic hypotension 11/18/2022   Gait abnormality 11/18/2022   Peripheral neuropathy 11/18/2022   Confusion 11/18/2022   Unilateral primary osteoarthritis, left knee 10/01/2021   Postural dizziness with presyncope 08/04/2021   Lumbar spinal stenosis 04/24/2021   Sepsis (HCC) 03/13/2021   AKI (acute kidney injury) (HCC) 03/13/2021   Elevated CK  03/13/2021   Severe sepsis (HCC) 03/13/2021   PNA (pneumonia) 03/12/2021   Restless legs    Acute respiratory failure with hypoxia (HCC)    Coronary artery disease involving native coronary artery of native heart without angina pectoris 09/19/2020   Abnormal stress test 09/16/2020   Hyperkalemia 09/12/2020   Acute renal failure 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) 09/12/2020   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   Type 2 diabetes mellitus (HCC)    Syncope and collapse 08/07/2020   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   PAD (peripheral artery disease) (HCC) 08/30/2019   Spondylolisthesis of lumbar region 07/13/2019   Spinal stenosis of lumbar region with neurogenic claudication 10/02/2016   Diabetic polyneuropathy (HCC) 11/10/2013    ONSET DATE: 4 months ago (May 2024)  REFERRING DIAG: I95.1 (ICD-10-CM) - Orthostatic hypotension R26.9 (ICD-10-CM) - Gait abnormality G62.89 (ICD-10-CM) - Other polyneuropathy R41.0 (ICD-10-CM) - Confusion  THERAPY DIAG:  Muscle weakness (generalized)  Unsteadiness on feet  Other abnormalities of gait and mobility  Repeated falls  Rationale for Evaluation and Treatment: Rehabilitation  SUBJECTIVE:                                                                                                                                                                                             SUBJECTIVE STATEMENT: Pt denies any acute changes or recent falls.  He ambulates into gym using Hurrycane today.  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 No  PRECAUTIONS: Fall  RED FLAGS: None   WEIGHT BEARING RESTRICTIONS: No  FALLS: Has patient fallen in last 6 months? Yes. Number of falls 2  - 1 about a month ago and 1 in May  LIVING  ENVIRONMENT: Lives with: lives with their spouse Lives in: House/apartment Stairs: No Has following equipment at home: Single point cane, Environmental consultant - 4 wheeled, shower chair, and Grab bars  PLOF: Independent - sometimes modI in community w/ cane  PATIENT GOALS: "To see how I'm doing, I'm doing this for my wife, but don't feel like I need it."  OBJECTIVE:  Blood glucose:  144  Vitals: Today's Vitals   12/11/22 1117  BP: (!) 121/54  Pulse: 71    Second vitals in standing: 81/35 on LUE, HR 81 bpm  DIAGNOSTIC FINDINGS:  All head and facial imaging from 8/19 WNL.    Pt has had recent heart monitor, but no results available to PT at this time and pt unsure of results.  COGNITION: Overall cognitive status: Within functional limits for tasks assessed   SENSATION: WFL Light touch: pt reports abnormal testing at neurologist - mid-shin before he could discriminate  COORDINATION: LE RAMS:  WNL Bilateral heel-to-shin:  WNL  EDEMA:  None noted in BLE - intermittent left knee swelling secondary to OA  MUSCLE TONE: None noted in BLE  POSTURE: forward head, left pelvic obliquity, and weight shift left  LOWER EXTREMITY ROM:     Active  Right Eval Left Eval  Hip flexion WNL WNL  Hip extension    Hip abduction " "  Hip adduction " "  Hip internal rotation    Hip external rotation    Knee flexion    Knee extension Lacks ~4-5 degrees of active extension/chronic flexion WNL  Ankle dorsiflexion    Ankle plantarflexion    Ankle inversion    Ankle eversion     (Blank rows = not tested)  LOWER EXTREMITY MMT:    MMT Right Eval Left Eval  Hip flexion 5/5 5/5  Hip extension    Hip abduction 4/5 4/5  Hip adduction 4/5 4/5  Hip internal rotation    Hip external rotation    Knee flexion 4+/5 4+/5  Knee extension 4/5 4+/5  Ankle dorsiflexion 4+/5 4+/5  Ankle plantarflexion    Ankle inversion    Ankle eversion    (Blank rows =  not tested)  BED MOBILITY:  Pt reports independence.  TRANSFERS: Assistive device utilized: Single point cane  Sit to stand: Modified independence Stand to sit: Modified independence Chair to chair: Modified independence Pt needs increased time to safely complete.   GAIT: Gait pattern: step through pattern and decreased stance time- Left Distance walked: various clinic distances Assistive device utilized:  Hurrycane Level of assistance: Modified independence Comments: No LOB, no noted scuff, adequate foot clearance, some maintained drawn in posture of left hand, mildly diminished left arm swing.  FUNCTIONAL TESTS:  5 times sit to stand: 12.25 seconds no UE support; no reported dizziness with movement. Timed up and go (TUG): 11.44 seconds w/ Hurrycane 10 meter walk test: 9.50 seconds w/ Hurrycane = 1.05 m/sec OR 3.47 ft/sec Berg Balance Scale: To be assessed.  PATIENT SURVEYS:  ABC scale To be assessed.  TODAY'S TREATMENT:  DATE: 12/11/2022 -Reprinted pt schedule per request as he does not have access to MyChart and walked him through future appts.   -Assessed BP on LUE in sitting prior to session: Today's Vitals   12/11/22 1117  BP: (!) 121/54  Pulse: 71  -BERG:  [facility] PT Assessment - 12/11/22 1117       Standardized Balance Assessment   Standardized Balance Assessment Berg Balance Test      Berg Balance Test   Sit to Stand Able to stand  independently using hands    Standing Unsupported Able to stand safely 2 minutes    Sitting with Back Unsupported but Feet Supported on Floor or Stool Able to sit safely and securely 2 minutes    Stand to Sit Controls descent by using hands    Transfers Able to transfer safely, definite need of hands    Standing Unsupported with Eyes Closed Able to stand 10 seconds safely    Standing Unsupported with Feet  Together Able to place feet together independently and stand for 1 minute with supervision    From Standing, Reach Forward with Outstretched Arm Can reach forward >12 cm safely (5")    From Standing Position, Pick up Object from Floor Able to pick up shoe safely and easily    From Standing Position, Turn to Look Behind Over each Shoulder Turn sideways only but maintains balance    Turn 360 Degrees Able to turn 360 degrees safely in 4 seconds or less    Standing Unsupported, Alternately Place Feet on Step/Stool Able to complete 4 steps without aid or supervision    Standing Unsupported, One Foot in Front Able to plae foot ahead of the other independently and hold 30 seconds    Standing on One Leg Tries to lift leg/unable to hold 3 seconds but remains standing independently    Total Score 43    Berg comment: 43/56 = significant fall risk            -ABC Scale: 90.63%  Initiated HEP:   -Forward reach across counter x20 progressing from using push off wall to return upright to cuing for glut engagement -Standing feet together no UE support x 1 minute -Tandem stance w/ unilateral counter support x1 minute each LE in rear -SLS w/ BUE on counter, PT facilitates corrected upright/trendelenburg, x1 minute each LE  -PT ambulates to front of clinic with pt for safety due to intermittent sporadic toe catch  PATIENT EDUCATION: Education details: Outcome interpretations and initial HEP based on performance.  Answered questions and provided safety modifications for HEP at end of session. Person educated: Patient Education method: Explanation Education comprehension: verbalized understanding and needs further education  HOME EXERCISE PROGRAM: Access Code: [email or web address] URL: [email or web address] Date: 12/11/2022 Prepared by: Camille Bal  Exercises - Forward Reach Using Hip Strategy Forward Backward  - 1 x daily - 5 x weekly - 2 sets - 10 reps - Corner Balance Feet Together With  Eyes Open  - 1 x daily - 5 x weekly - 1 sets - 3 reps - 1 minute hold - Standing Tandem Balance with Counter Support  - 1 x daily - 5 x weekly - 1 sets - 2 reps - 1 minute hold - Standing Single Leg Stance with Counter Support  - 1 x daily - 5 x weekly - 1 sets - 2-3 reps - 30 seconds to 1 minute hold - Plank with Thoracic Rotation on Counter  - 1 x daily - 5 x weekly -  2 sets - 10 reps  GOALS: Goals reviewed with patient? Yes  SHORT TERM GOALS = LONG TERM GOALS: Target date: 01/01/2023  Pt will be independent and compliant with finalized strength and balance HEP in order to maintain functional progress and improve mobility. Baseline:  To be established. Goal status: INITIAL  2.  Pt will demonstrate a gait speed of >3.67 feet/sec in order to decrease risk for falls. Baseline: 3.47 ft/sec Goal status: INITIAL  3.  Pt will increase BERG balance score to >/=47/56 to demonstrate improved static balance. Baseline:  43/56 (9/27) Goal status: INITIAL  4.  Pt will improve ABC Scale score to >/= 95.63% in order to demonstrate diminished fear of falling and decreased fall risk. Baseline:  90.63% (9/27) Goal status: INITIAL  ASSESSMENT:  CLINICAL IMPRESSION: Patient is a 82 y.o. male who was seen today for physical therapy evaluation and treatment for gait instability and general imbalance.  Pt has a significant PMH of polyneuropathy, lumbar spinal stenosis, PAD, Stage 3a CKD, DM2, CAD, left knee OA, and orthostatic hypotension.  Identified impairments include recent history of falls, orthostatic vitals, fwd head posture and left weight shift in sitting, decreased right active knee extension, mild BLE weakness, and decreased left stance during ambulation.  Evaluation via the following assessment tools: 5xSTS and TUG did not indicate elevated fall risk.  indicates some elevated fall risk in combination with orthostatics and history of repeated falls.  He would benefit from skilled PT to  address impairments as noted and progress towards long term goals.  OBJECTIVE IMPAIRMENTS: Abnormal gait, cardiopulmonary status limiting activity, decreased activity tolerance, decreased balance, decreased endurance, decreased knowledge of condition, decreased knowledge of use of DME, difficulty walking, decreased ROM, decreased strength, impaired flexibility, improper body mechanics, and postural dysfunction.   ACTIVITY LIMITATIONS: carrying, lifting, bending, standing, and locomotion level  PARTICIPATION LIMITATIONS: cleaning, laundry, driving, community activity, and yard work  PERSONAL FACTORS: Age, Fitness, Time since onset of injury/illness/exacerbation, and 1-2 comorbidities: polyneuropathy, orthostatic hypotension  are also affecting patient's functional outcome.   REHAB POTENTIAL: Good  CLINICAL DECISION MAKING: Evolving/moderate complexity  EVALUATION COMPLEXITY: Moderate  PLAN:  PT FREQUENCY: 1x/week  PT DURATION: 4 weeks (pt preference)  PLANNED INTERVENTIONS: Therapeutic exercises, Therapeutic activity, Neuromuscular re-education, Balance training, Gait training, Patient/Family education, Self Care, Joint mobilization, Stair training, Vestibular training, DME instructions, Manual therapy, and Re-evaluation  PLAN FOR NEXT SESSION: BERG, ABC scale - set goals.  Initiate HEP for strength and balance.  Gait training/stair training.  Sadie Haber, PT, DPT 12/11/2022, 11:58 AM

## 2022-12-11 NOTE — Patient Instructions (Signed)
Access Code: [email or web address] URL: [email or web address] Date: 12/11/2022 Prepared by: Camille Bal  Exercises - Forward Reach Using Hip Strategy Forward Backward  - 1 x daily - 5 x weekly - 2 sets - 10 reps - Corner Balance Feet Together With Eyes Open  - 1 x daily - 5 x weekly - 1 sets - 3 reps - 1 minute hold - Standing Tandem Balance with Counter Support  - 1 x daily - 5 x weekly - 1 sets - 2 reps - 1 minute hold - Standing Single Leg Stance with Counter Support  - 1 x daily - 5 x weekly - 1 sets - 2-3 reps - 30 seconds to 1 minute hold - Plank with Thoracic Rotation on Counter  - 1 x daily - 5 x weekly - 2 sets - 10 reps

## 2022-12-14 NOTE — Procedures (Signed)
   HISTORY: 82 years old with frequent fall, cognitive impairment  TECHNIQUE:  This is a routine 16 channel EEG recording with one channel devoted to a limited EKG recording.  It was performed during wakefulness, drowsiness and asleep.  Photic stimulation were performed as activating procedures.  Hyperventilation was not performed.  There are frequent bifrontal muscle artifact.  Upon maximum arousal, posterior dominant waking rhythm consistent of rhythmic alpha range activity. Activities are symmetric over the bilateral posterior derivations and attenuated with eye opening.  Photic stimulation did not alter the tracing.  Hyperventilation produced mild/moderate buildup with higher amplitude and the slower activities noted.  During EEG recording, patient developed drowsiness and no deeper stage of sleep was achieved  During EEG recording, there was no epileptiform discharge noted.  EKG demonstrate normal sinus rhythm.  CONCLUSION: This is a  normal awake EEG.  There is no electrodiagnostic evidence of epileptiform discharge.  Levert Feinstein, M.D. Ph.D.  [facility] [address] [place], [place] [address] Phone: [phone] Fax:      [phone]

## 2022-12-18 ENCOUNTER — Ambulatory Visit: Payer: Medicare HMO | Attending: Neurology | Admitting: Physical Therapy

## 2022-12-18 ENCOUNTER — Encounter: Payer: Self-pay | Admitting: Physical Therapy

## 2022-12-18 DIAGNOSIS — R296 Repeated falls: Secondary | ICD-10-CM | POA: Insufficient documentation

## 2022-12-18 DIAGNOSIS — R2689 Other abnormalities of gait and mobility: Secondary | ICD-10-CM | POA: Diagnosis not present

## 2022-12-18 DIAGNOSIS — M6281 Muscle weakness (generalized): Secondary | ICD-10-CM | POA: Insufficient documentation

## 2022-12-18 DIAGNOSIS — R2681 Unsteadiness on feet: Secondary | ICD-10-CM | POA: Insufficient documentation

## 2022-12-18 NOTE — Therapy (Signed)
OUTPATIENT PHYSICAL THERAPY NEURO TREATMENT   Patient Name: Harry Zamora MRN: [id number] DOB:1940-03-24, 82 y.o., male Today's Date: 12/18/2022  PCP: Adrian Prince, MD REFERRING PROVIDER: Levert Feinstein, MD  END OF SESSION:  PT End of Session - 12/18/22 1112     Visit Number 3    Number of Visits 5   4 + eval   Date for PT Re-Evaluation 01/08/23   pushed out due to scheduling delay   Authorization Type [employer] MEDICARE    Progress Note Due on Visit 10    PT Start Time 1104    PT Stop Time 1145    PT Time Calculation (min) 41 min    Equipment Utilized During Treatment Gait belt    Activity Tolerance Patient tolerated treatment well    Behavior During Therapy WFL for tasks assessed/performed             Past Medical History:  Diagnosis Date   Anemia    as an infant   Arthritis    Back pain    Depression    Disc displacement, lumbar    Gynecomastia    History of kidney stones    Hyperlipidemia    Hypertension    no longer on medications   Hypertriglyceridemia    Insomnia    Low back pain    Lumbar radiculopathy    Lumbar stenosis    OA (osteoarthritis)    Polyneuropathy in diabetes(357.2)    Prostate cancer (HCC) 2003   Restless legs    Ringing in ears    RLS (restless legs syndrome)    Type 2 diabetes mellitus (HCC)    Past Surgical History:  Procedure Laterality Date   ABDOMINAL EXPOSURE N/A 07/13/2019   Procedure: ABDOMINAL EXPOSURE;  Surgeon: Nada Libman, MD;  Location: [facility] OR;  Service: Vascular;  Laterality: N/A;  anterior approach   ANTERIOR LAT LUMBAR FUSION Right 07/13/2019   Procedure: Right Lumbar 3-4 Lumbar 4-5 Anterolateral lumbar interbody fusion;  Surgeon: Maeola Harman, MD;  Location: [facility] OR;  Service: Neurosurgery;  Laterality: Right;   ANTERIOR LUMBAR FUSION N/A 07/13/2019   Procedure: Lumbar Five-Sacral One Anterior lumbar interbody fusion;  Surgeon: Maeola Harman, MD;  Location: [facility] OR;  Service: Neurosurgery;  Laterality: N/A;  Anterior  approach   APPLICATION OF INTRAOPERATIVE CT SCAN N/A 04/24/2021   Procedure: APPLICATION OF INTRAOPERATIVE CT SCAN;  Surgeon: Dawley, Alan Mulder, DO;  Location: [facility] OR;  Service: Neurosurgery;  Laterality: N/A;  3C/RM 21   CHOLECYSTECTOMY     COLONOSCOPY     CORONARY STENT INTERVENTION N/A 10/01/2020   Procedure: CORONARY STENT INTERVENTION;  Surgeon: Elder Negus, MD;  Location: [facility] INVASIVE CV LAB;  Service: Cardiovascular;  Laterality: N/A;   EYE SURGERY Bilateral    GALLBLADDER SURGERY     HEMORRHOID SURGERY  1983   LEFT HEART CATH AND CORONARY ANGIOGRAPHY N/A 09/17/2020   Procedure: LEFT HEART CATH AND CORONARY ANGIOGRAPHY;  Surgeon: Elder Negus, MD;  Location: [facility] INVASIVE CV LAB;  Service: Cardiovascular;  Laterality: N/A;   LUMBAR LAMINECTOMY/DECOMPRESSION MICRODISCECTOMY N/A 10/02/2016   Procedure: L3 to S1 Laminectomy;  Surgeon: Maeola Harman, MD;  Location: [facility] OR;  Service: Neurosurgery;  Laterality: N/A;  L3 to S1 Laminectomy   LUMBAR LAMINECTOMY/DECOMPRESSION MICRODISCECTOMY Bilateral 09/07/2019   Procedure: Bilateral Lumbar Five - Sacral One Foraminotomy;  Surgeon: Maeola Harman, MD;  Location: [facility] OR;  Service: Neurosurgery;  Laterality: Bilateral;  posterior   LUMBAR PERCUTANEOUS PEDICLE SCREW 3 LEVEL N/A  07/13/2019   Procedure: Percutaneous pedicle screw fixation from Lumbar 3 to Sacral 1;  Surgeon: Maeola Harman, MD;  Location: [facility] OR;  Service: Neurosurgery;  Laterality: N/A;   MENISCUS REPAIR Right    PROSTATE SURGERY     SHOULDER SURGERY Left 01/2011   rotator cuff   Patient Active Problem List   Diagnosis Date Noted   Orthostatic hypotension 11/18/2022   Gait abnormality 11/18/2022   Peripheral neuropathy 11/18/2022   Confusion 11/18/2022   Unilateral primary osteoarthritis, left knee 10/01/2021   Postural dizziness with presyncope 08/04/2021   Lumbar spinal stenosis 04/24/2021   Sepsis (HCC) 03/13/2021   AKI (acute kidney injury) (HCC) 03/13/2021   Elevated CK  03/13/2021   Severe sepsis (HCC) 03/13/2021   PNA (pneumonia) 03/12/2021   Restless legs    Acute respiratory failure with hypoxia (HCC)    Coronary artery disease involving native coronary artery of native heart without angina pectoris 09/19/2020   Abnormal stress test 09/16/2020   Hyperkalemia 09/12/2020   Acute renal failure 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) 09/12/2020   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   Type 2 diabetes mellitus (HCC)    Syncope and collapse 08/07/2020   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   PAD (peripheral artery disease) (HCC) 08/30/2019   Spondylolisthesis of lumbar region 07/13/2019   Spinal stenosis of lumbar region with neurogenic claudication 10/02/2016   Diabetic polyneuropathy (HCC) 11/10/2013    ONSET DATE: 4 months ago (May 2024)  REFERRING DIAG: I95.1 (ICD-10-CM) - Orthostatic hypotension R26.9 (ICD-10-CM) - Gait abnormality G62.89 (ICD-10-CM) - Other polyneuropathy R41.0 (ICD-10-CM) - Confusion  THERAPY DIAG:  Muscle weakness (generalized)  Unsteadiness on feet  Other abnormalities of gait and mobility  Repeated falls  Rationale for Evaluation and Treatment: Rehabilitation  SUBJECTIVE:                                                                                                                                                                                             SUBJECTIVE STATEMENT: Pt states he had a fall Saturday where he scraped his left arm up despite falling onto his RUE.  He states he is otherwise fine, but his legs just gave out.  He was in the [employer] and not using his cane when he fell.  He ambulates into gym using Hurrycane today.   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 No -  has a loose guaze type bandage around left upper arm, denies any pain or tenderness over this area or left hip.  PRECAUTIONS: Fall  RED FLAGS: None   WEIGHT BEARING RESTRICTIONS: No  FALLS: Has patient fallen in last 6 months? Yes. Number of falls 2 - 1 about a month ago and 1 in May  LIVING ENVIRONMENT: Lives with: lives with their spouse Lives in: House/apartment Stairs: No Has following equipment at home: Single point cane, Environmental consultant - 4 wheeled, shower chair, and Grab bars  PLOF: Independent - sometimes modI in community w/ cane  PATIENT GOALS: "To see how I'm doing, I'm doing this for my wife, but don't feel like I need it."  OBJECTIVE:  Blood glucose:  144  Vitals: There were no vitals filed for this visit.   Second vitals in standing: 81/35 on LUE, HR 81 bpm  DIAGNOSTIC FINDINGS:  All head and facial imaging from 8/19 WNL.    Pt has had recent heart monitor, but no results available to PT at this time and pt unsure of results.  COGNITION: Overall cognitive status: Within functional limits for tasks assessed   SENSATION: WFL Light touch: pt reports abnormal testing at neurologist - mid-shin before he could discriminate  COORDINATION: LE RAMS:  WNL Bilateral heel-to-shin:  WNL  EDEMA:  None noted in BLE - intermittent left knee swelling secondary to OA  MUSCLE TONE: None noted in BLE  POSTURE: forward head, left pelvic obliquity, and weight shift left  LOWER EXTREMITY ROM:     Active  Right Eval Left Eval  Hip flexion WNL WNL  Hip extension    Hip abduction " "  Hip adduction " "  Hip internal rotation    Hip external rotation    Knee flexion    Knee extension Lacks ~4-5 degrees of active extension/chronic flexion WNL  Ankle dorsiflexion    Ankle plantarflexion    Ankle inversion    Ankle eversion     (Blank rows = not tested)  LOWER EXTREMITY MMT:    MMT Right Eval Left Eval  Hip flexion 5/5 5/5  Hip  extension    Hip abduction 4/5 4/5  Hip adduction 4/5 4/5  Hip internal rotation    Hip external rotation    Knee flexion 4+/5 4+/5  Knee extension 4/5 4+/5  Ankle dorsiflexion 4+/5 4+/5  Ankle plantarflexion    Ankle inversion    Ankle eversion    (Blank rows = not tested)  BED MOBILITY:  Pt reports independence.  TRANSFERS: Assistive device utilized: Single point cane  Sit to stand: Modified independence Stand to sit: Modified independence Chair to chair: Modified independence Pt needs increased time to safely complete.   GAIT: Gait pattern: step through pattern and decreased stance time- Left Distance walked: various clinic distances Assistive device utilized:  Hurrycane Level of assistance: Modified independence Comments: No LOB, no noted scuff, adequate foot clearance, some maintained drawn in posture of left hand, mildly diminished left arm swing.  FUNCTIONAL TESTS:  5 times sit to stand: 12.25 seconds no UE support; no reported dizziness with movement. Timed up and go (TUG): 11.44 seconds w/ Hurrycane 10 meter walk test: 9.50 seconds w/ Hurrycane = 1.05 m/sec OR 3.47 ft/sec Berg Balance Scale: To be assessed.  PATIENT SURVEYS:  ABC scale To be assessed.  TODAY'S TREATMENT:  DATE: 12/18/2022 -SciFit x8 minutes in progressive mode to level 6.0 for dynamic cardiac warmup using BUE/BLE  -Obstacle course 1:  blue mat and firm surface 4-8" hurdles; edu on obstacle approximation to improve clearance -Obstacle course 2:  blue mat w/ unlevel surface and 4-8" hurdles; discussed using visual scanning to improve motor planning when walking over unlevel surfaces -Obstacle course 3:  weaving through tight turns and linear 8" cone tapping; cues to improve BOS with turns *All obstacle courses performed x4 rounds w/ CGA-SBA  -Bilateral leg press at 70 lbs  2x8 w/ PT cuing for eccentric control and to prevent knee hyperextension R worse than L  PATIENT EDUCATION: Education details: Continue HEP.  Recommended using cane at all times to limit fall risk. Person educated: Patient Education method: Explanation Education comprehension: verbalized understanding and needs further education  HOME EXERCISE PROGRAM: Access Code: [email or web address] URL: [email or web address] Date: 12/11/2022 Prepared by: Camille Bal  Exercises - Forward Reach Using Hip Strategy Forward Backward  - 1 x daily - 5 x weekly - 2 sets - 10 reps - Corner Balance Feet Together With Eyes Open  - 1 x daily - 5 x weekly - 1 sets - 3 reps - 1 minute hold - Standing Tandem Balance with Counter Support  - 1 x daily - 5 x weekly - 1 sets - 2 reps - 1 minute hold - Standing Single Leg Stance with Counter Support  - 1 x daily - 5 x weekly - 1 sets - 2-3 reps - 30 seconds to 1 minute hold - Plank with Thoracic Rotation on Counter  - 1 x daily - 5 x weekly - 2 sets - 10 reps  GOALS: Goals reviewed with patient? Yes  SHORT TERM GOALS = LONG TERM GOALS: Target date: 01/01/2023  Pt will be independent and compliant with finalized strength and balance HEP in order to maintain functional progress and improve mobility. Baseline:  To be established. Goal status: INITIAL  2.  Pt will demonstrate a gait speed of >3.67 feet/sec in order to decrease risk for falls. Baseline: 3.47 ft/sec Goal status: INITIAL  3.  Pt will increase BERG balance score to >/=47/56 to demonstrate improved static balance. Baseline:  43/56 (9/27) Goal status: INITIAL  4.  Pt will improve ABC Scale score to >/= 95.63% in order to demonstrate diminished fear of falling and decreased fall risk. Baseline:  90.63% (9/27) Goal status: INITIAL  ASSESSMENT:  CLINICAL IMPRESSION: Ongoing strengthening and dynamic balance work this session.  Discussed pt using cane when not in home to further decrease fall  risk from potential stumbles over or around obstacles.  He was most challenged by ambulation over unlevel ground demonstrating difficulty with motor planning to promote fluidity over challenging terrain.  He continues to benefit from skilled PT to improve overall stability and improved step clearance.  Will continue per POC.  OBJECTIVE IMPAIRMENTS: Abnormal gait, cardiopulmonary status limiting activity, decreased activity tolerance, decreased balance, decreased endurance, decreased knowledge of condition, decreased knowledge of use of DME, difficulty walking, decreased ROM, decreased strength, impaired flexibility, improper body mechanics, and postural dysfunction.   ACTIVITY LIMITATIONS: carrying, lifting, bending, standing, and locomotion level  PARTICIPATION LIMITATIONS: cleaning, laundry, driving, community activity, and yard work  PERSONAL FACTORS: Age, Fitness, Time since onset of injury/illness/exacerbation, and 1-2 comorbidities: polyneuropathy, orthostatic hypotension  are also affecting patient's functional outcome.   REHAB POTENTIAL: Good  CLINICAL DECISION MAKING: Evolving/moderate complexity  EVALUATION COMPLEXITY: Moderate  PLAN:  PT FREQUENCY: 1x/week  PT DURATION: 4 weeks (pt preference)  PLANNED INTERVENTIONS: Therapeutic exercises, Therapeutic activity, Neuromuscular re-education, Balance training, Gait training, Patient/Family education, Self Care, Joint mobilization, Stair training, Vestibular training, DME instructions, Manual therapy, and Re-evaluation  PLAN FOR NEXT SESSION: Modify HEP prn for strength and balance.  Gait training/stair training.  SciFit.  Leg press.  Dynamic balance - blaze pods, slam ball variations - STS, step outs, bouncing on airex  Sadie Haber, PT, DPT 12/18/2022, 11:52 AM

## 2022-12-21 ENCOUNTER — Ambulatory Visit: Payer: Medicare HMO | Attending: Cardiology | Admitting: Cardiology

## 2022-12-21 ENCOUNTER — Encounter: Payer: Self-pay | Admitting: Cardiology

## 2022-12-21 VITALS — BP 110/60 | HR 80 | Resp 16 | Ht 74.0 in | Wt 174.6 lb

## 2022-12-21 DIAGNOSIS — I2510 Atherosclerotic heart disease of native coronary artery without angina pectoris: Secondary | ICD-10-CM | POA: Diagnosis not present

## 2022-12-21 DIAGNOSIS — I951 Orthostatic hypotension: Secondary | ICD-10-CM

## 2022-12-21 NOTE — Patient Instructions (Signed)
Medication Instructions:  No changes *If you need a refill on your cardiac medications before your next appointment, please call your pharmacy*   Lab Work: None If you have labs (blood work) drawn today and your tests are completely normal, you will receive your results only by: MyChart Message (if you have MyChart) OR A paper copy in the mail If you have any lab test that is abnormal or we need to change your treatment, we will call you to review the results.   Testing/Procedures: None   Follow-Up: At [facility], you and your health needs are our priority.  As part of our continuing mission to provide you with exceptional heart care, we have created designated Provider Care Teams.  These Care Teams include your primary Cardiologist (physician) and Advanced Practice Providers (APPs -  Physician Assistants and Nurse Practitioners) who all work together to provide you with the care you need, when you need it.  We recommend signing up for the patient portal called "MyChart".  Sign up information is provided on this After Visit Summary.  MyChart is used to connect with patients for Virtual Visits (Telemedicine).  Patients are able to view lab/test results, encounter notes, upcoming appointments, etc.  Non-urgent messages can be sent to your provider as well.   To learn more about what you can do with MyChart, go to [email or web address].    Your next appointment:   6 month(s)  Provider:   Truett Mainland, MD

## 2022-12-21 NOTE — Progress Notes (Signed)
Cardiology Office Note:  .   Date:  12/21/2022  ID:  Harry Zamora, DOB 25-May-1940, MRN [id number] PCP: Adrian Prince, MD   HeartCare Providers Cardiologist:  Truett Mainland, MD PCP: Adrian Prince, MD  Chief Complaint  Patient presents with   Coronary artery disease involving native coronary artery of      History of Present Illness: .    Harry Zamora is a 82 y.o. male with uncontrolled type 2 diabetes mellitus, CAD, PAD, syncope, spinal stenosis s/p prior surgeries   Patient continues to have frequent fall episodes.  He was recently evaluated by neurologist, was noted to have significant orthostatic hypotension.  Patient drinks only 16 ounces of water every day.  He denies any chest pain, shortness of breath symptoms.  Reviewed recent lab results with patient, details below.  Vitals:   12/21/22 1157  BP: 110/60  Pulse: 80  Resp: 16  SpO2: 99%     ROS:  ROS   Studies Reviewed: .       11/2022: Cholesterol 85, triglycerides 122, HDL 37, LDL 24  Labs 10/2022: Glucose 228, NA/K 130/5.3 H/H 10.9/33.3, platelets 143. HbA1c 7.5% Rest unremarkable  Mobile cardiac telemetry 13 days 10/05/2022 - 10/19/2022: Dominant rhythm: Sinus. HR 56-154 bpm. Avg HR 71 bpm, in sinus rhythm. 15 episodes of atrial tachycardia, fastest at 154 bpm for 5 beats, longest for 7 beats at 125 bpm. <1% isolated SVE, couplet/triplets. 0 episodes of VT. <1% isolated VE, couplets. No atrial fibrillation/atrial flutter/VT/high grade AV block, sinus pause >3sec noted. 0 patient triggered events.     Physical Exam:   Physical Exam   VISIT DIAGNOSES:   ICD-10-CM   1. Coronary artery disease involving native coronary artery of native heart without angina pectoris  I25.10     2. Orthostatic hypotension  I95.1        ASSESSMENT AND PLAN: .    Harry Zamora is a 82 y.o. male with uncontrolled type 2 diabetes mellitus, CAD, PAD, recurrent syncope, spinal stenosis s/p prior  surgeries, interstitial lung disease   Dizziness, frequent falls: No specific symptoms to suggest arrhythmia.  Recent Zio patch with no arrhythmia to explain syncope.  Evaluated neurology, to have significant orthostatic hypotension suggestive of borderline failure at that time. Encourage increasing water intake to at least 64 ounces.  Okay to be slightly more liberal on salt intake, to allow up to 2300 mg of sodium daily. 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  Continue regular follow-up with Dr. Leafy Half.   CAD:  Obstructive CAD with high risk anatomy, stable angina Successfully treated with LAD/ramus T stenting (10/01/2020) No angina symptoms at this time. Continue plavix, metoprolol succinate 25 mg daily. Continue rosuvastatin 40 mg daily.  LDL 24.   PAD: B/l ABI 0.73 (08/2020) No critical limb ischemia or lifestyle limiting claudication Previously had a focal eschar on left foot 2nd toe, which could have been an atheroembolic event. Likely source could be distal aortic arch ulcer. Now completely healed. Continue medical management and regular walking Continue plavix, statin.   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 Laneta Simmers.   F/u in 6 months  Signed, Elder Negus, MD

## 2022-12-25 ENCOUNTER — Ambulatory Visit: Payer: Medicare HMO

## 2022-12-25 DIAGNOSIS — R2681 Unsteadiness on feet: Secondary | ICD-10-CM

## 2022-12-25 DIAGNOSIS — R2689 Other abnormalities of gait and mobility: Secondary | ICD-10-CM

## 2022-12-25 DIAGNOSIS — M6281 Muscle weakness (generalized): Secondary | ICD-10-CM

## 2022-12-25 NOTE — Therapy (Addendum)
OUTPATIENT PHYSICAL THERAPY NEURO TREATMENT   Patient Name: Harry Zamora MRN: [id number] DOB:09/04/1940, 82 y.o., male Today's Date: 12/25/2022  PCP: Adrian Prince, MD REFERRING PROVIDER: Levert Feinstein, MD  END OF SESSION:  PT End of Session - 12/25/22 0934     Visit Number 4    Number of Visits 5   4 + eval   Date for PT Re-Evaluation 01/08/23   pushed out due to scheduling delay   Authorization Type [employer]ICARE    Progress Note Due on Visit 10    PT Start Time 0930    PT Stop Time 1010    PT Time Calculation (min) 40 min    Equipment Utilized During Treatment Gait belt    Activity Tolerance Patient tolerated treatment well    Behavior During Therapy WFL for tasks assessed/performed              Past Medical History:  Diagnosis Date   Anemia    as an infant   Arthritis    Back pain    Depression    Disc displacement, lumbar    Gynecomastia    History of kidney stones    Hyperlipidemia    Hypertension    no longer on medications   Hypertriglyceridemia    Insomnia    Low back pain    Lumbar radiculopathy    Lumbar stenosis    OA (osteoarthritis)    Polyneuropathy in diabetes(357.2)    Prostate cancer (HCC) 2003   Restless legs    Ringing in ears    RLS (restless legs syndrome)    Type 2 diabetes mellitus (HCC)    Past Surgical History:  Procedure Laterality Date   ABDOMINAL EXPOSURE N/A 07/13/2019   Procedure: ABDOMINAL EXPOSURE;  Surgeon: Nada Libman, MD;  Location: [facility] OR;  Service: Vascular;  Laterality: N/A;  anterior approach   ANTERIOR LAT LUMBAR FUSION Right 07/13/2019   Procedure: Right Lumbar 3-4 Lumbar 4-5 Anterolateral lumbar interbody fusion;  Surgeon: Maeola Harman, MD;  Location: [facility] OR;  Service: Neurosurgery;  Laterality: Right;   ANTERIOR LUMBAR FUSION N/A 07/13/2019   Procedure: Lumbar Five-Sacral One Anterior lumbar interbody fusion;  Surgeon: Maeola Harman, MD;  Location: [facility] OR;  Service: Neurosurgery;  Laterality: N/A;  Anterior  approach   APPLICATION OF INTRAOPERATIVE CT SCAN N/A 04/24/2021   Procedure: APPLICATION OF INTRAOPERATIVE CT SCAN;  Surgeon: Dawley, Alan Mulder, DO;  Location: [facility] OR;  Service: Neurosurgery;  Laterality: N/A;  3C/RM 21   CHOLECYSTECTOMY     COLONOSCOPY     CORONARY STENT INTERVENTION N/A 10/01/2020   Procedure: CORONARY STENT INTERVENTION;  Surgeon: Elder Negus, MD;  Location: [facility] INVASIVE CV LAB;  Service: Cardiovascular;  Laterality: N/A;   EYE SURGERY Bilateral    GALLBLADDER SURGERY     HEMORRHOID SURGERY  1983   LEFT HEART CATH AND CORONARY ANGIOGRAPHY N/A 09/17/2020   Procedure: LEFT HEART CATH AND CORONARY ANGIOGRAPHY;  Surgeon: Elder Negus, MD;  Location: [facility] INVAS[facility] CV LAB;  Service: Cardiovascular;  Laterality: N/A;   LUMBAR LAMINECTOMY/DECOMPRESSION MICRODISCECTOMY N/A 10/02/2016   Procedure: L3 to S1 Laminectomy;  Surgeon: Maeola Harman, MD;  Location: [facility] OR;  Service: Neurosurgery;  Laterality: N/A;  L3 to S1 Laminectomy   LUMBAR LAMINECTOMY/DECOMPRESSION MICRODISCECTOMY Bilateral 09/07/2019   Procedure: Bilateral Lumbar Five - Sacral One Foraminotomy;  Surgeon: Maeola Harman, MD;  Location: [facility] OR;  Service: Neurosurgery;  Laterality: Bilateral;  posterior   LUMBAR PERCUTANEOUS PEDICLE SCREW 3 LEVEL  N/A 07/13/2019   Procedure: Percutaneous pedicle screw fixation from Lumbar 3 to Sacral 1;  Surgeon: Maeola Harman, MD;  Location: [facility] OR;  Service: Neurosurgery;  Laterality: N/A;   MENISCUS REPAIR Right    PROSTATE SURGERY     SHOULDER SURGERY Left 01/2011   rotator cuff   Patient Active Problem List   Diagnosis Date Noted   Orthostatic hypotension 11/18/2022   Gait abnormality 11/18/2022   Peripheral neuropathy 11/18/2022   Confusion 11/18/2022   Unilateral primary osteoarthritis, left knee 10/01/2021   Postural dizziness with presyncope 08/04/2021   Lumbar spinal stenosis 04/24/2021   Sepsis (HCC) 03/13/2021   AKI (acute kidney injury) (HCC) 03/13/2021   Elevated CK  03/13/2021   Severe sepsis (HCC) 03/13/2021   PNA (pneumonia) 03/12/2021   Restless legs    Acute respiratory failure with hypoxia (HCC)    Coronary artery disease involving native coronary artery of native heart without angina pectoris 09/19/2020   Abnormal stress test 09/16/2020   Hyperkalemia 09/12/2020   Acute renal failure 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) 09/12/2020   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   Type 2 diabetes mellitus (HCC)    Syncope and collapse 08/07/2020   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   PAD (peripheral artery disease) (HCC) 08/30/2019   Spondylolisthesis of lumbar region 07/13/2019   Spinal stenosis of lumbar region with neurogenic claudication 10/02/2016   Diabetic polyneuropathy (HCC) 11/10/2013    ONSET DATE: 4 months ago (May 2024)  REFERRING DIAG: I95.1 (ICD-10-CM) - Orthostatic hypotension R26.9 (ICD-10-CM) - Gait abnormality G62.89 (ICD-10-CM) - Other polyneuropathy R41.0 (ICD-10-CM) - Confusion  THERAPY DIAG:  Muscle weakness (generalized)  Other abnormalities of gait and mobility  Unsteadiness on feet  Rationale for Evaluation and Treatment: Rehabilitation  SUBJECTIVE:                                                                                                                                                                                             SUBJECTIVE STATEMENT: Pt reports no falls since last fall at 7/11. Wound is currently open and pt reports that he is letting it air out right now but will place bandaid and vaseline when he gets home.  Pt reports the falls started happening about 1.5 year ago and frequency of falls haven't changed much. Pt reports he falls about once a month on average due to his legs giving away. He reports doctors don't know why this is happening.  Pt accompanied by: significant other - Wife [place] drove him today  PERTINENT HISTORY: Polyneuropathy, lumbar spinal  stenosis, PAD, Stage 3a CKD, DM2,  CAD, left knee OA, orthostatic hypotensionL3 through S1 fusion.   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 No - has a loose guaze type bandage around left upper arm, denies any pain or tenderness over this area or left hip.  PRECAUTIONS: Fall  RED FLAGS: None   WEIGHT BEARING RESTRICTIONS: No  FALLS: Has patient fallen in last 6 months? Yes. Number of falls 2 - 1 about a month ago and 1 in May  LIVING ENVIRONMENT: Lives with: lives with their spouse Lives in: House/apartment Stairs: No Has following equipment at home: Single point cane, Environmental consultant - 4 wheeled, shower chair, and Grab bars  PLOF: Independent - sometimes modI in community w/ cane  PATIENT GOALS: "To see how I'm doing, I'm doing this for my wife, but don't feel like I need it."  OBJECTIVE:   COGNITION: Overall cognitive status: Within functional limits for tasks assessed   SENSATION: WFL Light touch: pt reports abnormal testing at neurologist - mid-shin before he could discriminate  COORDINATION: LE RAMS:  WNL Bilateral heel-to-shin:  WNL  EDEMA:  None noted in BLE - intermittent left knee swelling secondary to OA  MUSCLE TONE: None noted in BLE  POSTURE: forward head, left pelvic obliquity, and weight shift left  LOWER EXTREMITY ROM:     Active  Right Eval Left Eval  Hip flexion WNL WNL  Hip extension    Hip abduction " "  Hip adduction " "  Hip internal rotation    Hip external rotation    Knee flexion    Knee extension Lacks ~4-5 degrees of active extension/chronic flexion WNL  Ankle dorsiflexion    Ankle plantarflexion    Ankle inversion    Ankle eversion     (Blank rows = not tested)  LOWER EXTREMITY MMT:    MMT Right Eval Left Eval  Hip flexion 5/5 5/5  Hip extension    Hip abduction 4/5 4/5  Hip adduction 4/5 4/5  Hip internal rotation    Hip external rotation    Knee flexion 4+/5 4+/5  Knee extension 4/5  4+/5  Ankle dorsiflexion 4+/5 4+/5  Ankle plantarflexion    Ankle inversion    Ankle eversion    (Blank rows = not tested)  BED MOBILITY:  Pt reports independence.  TRANSFERS: Assistive device utilized: Single point cane  Sit to stand: Modified independence Stand to sit: Modified independence Chair to chair: Modified independence Pt needs increased time to safely complete.   GAIT: Gait pattern: step through pattern and decreased stance time- Left Distance walked: various clinic distances Assistive device utilized:  Hurrycane Level of assistance: Modified independence Comments: No LOB, no noted scuff, adequate foot clearance, some maintained drawn in posture of left hand, mildly diminished left arm swing.  FUNCTIONAL TESTS:  5 times sit to stand: 12.25 seconds no UE support; no reported dizziness with movement. Timed up and go (TUG): 11.44 seconds w/ Hurrycane 10 meter walk test: 9.50 seconds w/ Hurrycane = 1.05 m/sec OR 3.47 ft/sec Berg Balance Scale: 43/56 (9/27)   PATIENT SURVEYS:  ABC scale 90.63% (9/27)   TODAY'S TREATMENT:  DATE: 12/25/22 Pt present with [facility] C[facility] today. -Sci ZOX:WRUEAV level 7, UE and LE for 8' - Fwd and bwd walking: 10 x 15 feet with CGA  Performed BBS - SLS: 10 x 5" holds - Tandem stance on floor: 3 x 30" R and L Assessed supine SLR bil, pt reported no adverse nerve pulling or pain or stretch in posterior leg but reported pain in anterior knees B, when left was lifted >45 deg.- negative tests bil, able to elevate legs bil to 65 deg Assessed hamstring legnths bil: lacks about 40 deg bil but pt reports anterior knee pain instead of posterior leg stretching bil. Pt has good mobility in bil hips with knee to chest without significant tightness.  Pt educated on benefits of using cane (as his falls are intermittent, without  warnings) as it can potentially slow down his falls, prevent his falls and reduce risk of serious injuries.  Reviewed HEP verbally with patient.  PATIENT EDUCATION: Education details: Continue HEP.  Recommended using cane at all times to limit fall risk. Person educated: Patient Education method: Explanation Education comprehension: verbalized understanding and needs further education  HOME EXERCISE PROGRAM: Access Code: [email or web address] URL: [email or web address] Date: 12/11/2022 Prepared by: Camille Bal  Exercises - Forward Reach Using Hip Strategy Forward Backward  - 1 x daily - 5 x weekly - 2 sets - 10 reps - Corner Balance Feet Together With Eyes Open  - 1 x daily - 5 x weekly - 1 sets - 3 reps - 1 minute hold - Standing Tandem Balance with Counter Support  - 1 x daily - 5 x weekly - 1 sets - 2 reps - 1 minute hold - Standing Single Leg Stance with Counter Support  - 1 x daily - 5 x weekly - 1 sets - 2-3 reps - 30 seconds to 1 minute hold - Plank with Thoracic Rotation on Counter  - 1 x daily - 5 x weekly - 2 sets - 10 reps  GOALS: Goals reviewed with patient? Yes  SHORT TERM GOALS = LONG TERM GOALS: Target date: 01/01/2023  Pt will be independent and compliant with finalized strength and balance HEP in order to maintain functional progress and improve mobility. Baseline:  To be established. Goal status: Goal met  2.  Pt will demonstrate a gait speed of >3.67 feet/sec in order to decrease risk for falls. Baseline: 3.47 ft/sec Goal status: INITIAL  3.  Pt will increase BERG balance score to >/=47/56 to demonstrate improved static balance. Baseline:  43/56 (9/27); 53/56 (10/11) Goal status:Goal met  4.  Pt will improve ABC Scale score to >/= 95.63% in order to demonstrate diminished fear of falling and decreased fall risk. Baseline:  90.63% (9/27) Goal status: INITIAL  ASSESSMENT:  CLINICAL IMPRESSION: Today's session focused on reassessment of patient's  symptoms. Pt demo improvement in BBS from 43/56 to 53/56 which is very close to his maximum potential. Patient demo significant improvements with SLS balance, tandem balance, forward functional reach and ability to perform sit to stand without UE support. Pt met LTG#1 and 3 today  OBJECTIVE IMPAIRMENTS: Abnormal gait, cardiopulmonary status limiting activity, decreased activity tolerance, decreased balance, decreased endurance, decreased knowledge of condition, decreased knowledge of use of DME, difficulty walking, decreased ROM, decreased strength, impaired flexibility, improper body mechanics, and postural dysfunction.   ACTIVITY LIMITATIONS: carrying, lifting, bending, standing, and locomotion level  PARTICIPATION LIMITATIONS: cleaning, laundry, driving, community activity, and yard work  PERSONAL FACTORS: Age, Fitness, Time since onset of  injury/illness/exacerbation, and 1-2 comorbidities: polyneuropathy, orthostatic hypotension  are also affecting patient's functional outcome.   REHAB POTENTIAL: Good  CLINICAL DECISION MAKING: Evolving/moderate complexity  EVALUATION COMPLEXITY: Moderate  PLAN:  PT FREQUENCY: 1x/week  PT DURATION: 4 weeks (pt preference)  PLANNED INTERVENTIONS: Therapeutic exercises, Therapeutic activity, Neuromuscular re-education, Balance training, Gait training, Patient/Family education, Self Care, Joint mobilization, Stair training, Vestibular training, DME instructions, Manual therapy, and Re-evaluation  PLAN FOR NEXT SESSION: address remainder of LTG and potentially discharge?  Ileana Ladd, PT, DPT 12/25/2022, 10:15 AM

## 2022-12-28 ENCOUNTER — Telehealth: Payer: Self-pay | Admitting: Neurology

## 2022-12-28 NOTE — Telephone Encounter (Signed)
He has pending appt on Nov 1st, I would like to reevaluate him then, also advise him to bring his family before we decided on his driving situation

## 2022-12-28 NOTE — Telephone Encounter (Signed)
Call to patient, wife says that he will be at appointment in November, he says he might be. I advised that Dr. Terrace Arabia didn't say that he could drive and there is no documentation that he can't. Husband and wife began arguing on the phone about him driving and I advised that Dr. Terrace Arabia wanted to reevaluate him and discuss driving with his family. Wife states he falls a lot and has hit his head. Unable to obtain more information because of arguing between husband and wife and patient wanting to get off the phone.

## 2022-12-28 NOTE — Telephone Encounter (Signed)
Pt called and LVM asking if provider will be ok for him to start driving again. Please advise.

## 2022-12-31 ENCOUNTER — Ambulatory Visit: Payer: Medicare HMO | Admitting: Physical Therapy

## 2023-01-01 ENCOUNTER — Ambulatory Visit: Payer: Medicare HMO | Admitting: Physical Therapy

## 2023-01-06 ENCOUNTER — Ambulatory Visit: Payer: Self-pay | Admitting: Cardiology

## 2023-01-15 ENCOUNTER — Ambulatory Visit (INDEPENDENT_AMBULATORY_CARE_PROVIDER_SITE_OTHER): Payer: Self-pay | Admitting: Neurology

## 2023-01-15 ENCOUNTER — Ambulatory Visit (INDEPENDENT_AMBULATORY_CARE_PROVIDER_SITE_OTHER): Payer: Medicare HMO | Admitting: Neurology

## 2023-01-15 VITALS — BP 150/67 | HR 69 | Ht 74.0 in | Wt 180.0 lb

## 2023-01-15 DIAGNOSIS — Z0289 Encounter for other administrative examinations: Secondary | ICD-10-CM

## 2023-01-15 DIAGNOSIS — I951 Orthostatic hypotension: Secondary | ICD-10-CM

## 2023-01-15 DIAGNOSIS — R269 Unspecified abnormalities of gait and mobility: Secondary | ICD-10-CM

## 2023-01-15 NOTE — Progress Notes (Signed)
ASSESSMENT AND PLAN  BEJAMIN HACKBART is a 82 y.o. male   Frequent fall Orthostatic hypotension Diabetic peripheral neuropathy  Advised patient to increase water intake, had cardiac control of his diabetes,  If he remains symptomatic may consider low-dose midodrine 5 mg 3 times a day as needed  Cognitive impairment  MRI of brain without contrast  Return To Clinic With NP In 6 Months   DIAGNOSTIC DATA (LABS, IMAGING, TESTING) - I reviewed patient records, labs, notes, testing and imaging myself where available.   MEDICAL HISTORY:  VIRAJ LIBY is a 82 year old male, accompanied by his wife, seen in request by his primary care physician Dr.   Evlyn Kanner, Jeannett Senior for evaluation of frequent falling, initial evaluation November 18, 2022  I reviewed and summarized the referring note. PMHX. HLD DM Restless leg Peripheral neuropathy Lumbar decompression CAD   He used to be very active, but become much sedentary since 2023, began to have frequent fall, most recent was a week ago, he sat in the sofa watching Western movie for 4 hours, around midnight, getting up to go to the bedroom, without warning signs, he fell to the ground, with his right scalp abrasion, wife was able to help him up he went to sleep afterwards,  All fall happened in the similar scenario, shortly after his stand up, no loss of consciousness, no bowel or bladder incontinence, sometimes proceeding by lower extremity quivering, if he has been helped sitting down, his symptoms will improve.  He also complains of gradual onset bilateral lower extremity paresthesia, gait abnormality, began to use cane since 2024  He also complains intermittent sudden nausea, threw up episode over the past few years, does not happen often, but often times without clear triggers, intermittent constipations.  Over the past couple years, he had a gradual onset memory loss, he is a retired Company secretary, had a high school education, [employer]  examination 20/30 today,  UPDATE Jan 15 2023: He return for electrodiagnostic study today, which demonstrate length-dependent mild axonal sensorimotor neuropathy, also evidence of chronic lumbar radiculopathy, he did have a history of lumbar decompression surgery in the past  He fell again few days ago, previous visit demonstrate significant orthostatic hypotension, without corresponding heart rate change, today's blood pressure numbers much better, he did not take any of the morning medications, still orthostatic positive, without corresponding significant increase of heart rate  Laboratory evaluation showed normal B12, RPR folic acid TSH, C-reactive protein, CPK, ANA, protein electrophoresis, normal iron panel, A1c was elevated 7.5  PHYSICAL EXAM:   There were no vitals filed for this visit. Lying down 160/72, hr 67  Sitting up 142/60, HR66  Standing up 131/59,hr69 Standing up xone minues 138/66, HR 76,    PHYSICAL EXAMNIATION:  Gen: NAD, conversant, well nourised, well groomed                     Cardiovascular: Regular rate rhythm, no peripheral edema, warm, nontender. Eyes: Conjunctivae clear without exudates or hemorrhage Neck: Supple, no carotid bruits. Pulmonary: Clear to auscultation bilaterally   NEUROLOGICAL EXAM:  MENTAL STATUS: Speech/cognition: Awake, alert, oriented to history taking and casual conversation     11/18/2022    2:59 PM  Montreal Cognitive Assessment   Visuospatial/ Executive (0/5) 0  Naming (0/3) 3  Attention: Read list of digits (0/2) 2  Attention: Read list of letters (0/1) 1  Attention: Serial 7 subtraction starting at 100 (0/3) 3  Language: Repeat phrase (0/2) 1  Language : Fluency (0/1) 0  Abstraction (0/2) 2  Delayed Recall (0/5) 4  Orientation (0/6) 4  Total 20  Adjusted Score (based on education) 20    CRANIAL NERVES: CN II: Visual fields are full to confrontation. Pupils are round equal and briskly reactive to light. CN III, IV,  VI: extraocular movement are normal. No ptosis. CN V: Facial sensation is intact to light touch CN VII: Face is symmetric with normal eye closure  CN VIII: Hearing is normal to causal conversation. CN IX, X: Phonation is normal. CN XI: Head turning and shoulder shrug are intact  MOTOR: There is no pronator drift of out-stretched arms. Muscle bulk and tone are normal. Muscle strength is normal.  REFLEXES: Reflexes are 1  and symmetric at the biceps, triceps, knees, and absent at ankles. Plantar responses are flexor.  SENSORY: Length-dependent decreased to light touch, pinprick, vibratory sensation to midshin level  COORDINATION: There is no trunk or limb dysmetria noted.  GAIT/STANCE: Need push-up to get up from seated position, wide-based, cautious  REVIEW OF SYSTEMS:  Full 14 system review of systems performed and notable only for as above All other review of systems were negative.   ALLERGIES: Allergies  Allergen Reactions   Atorvastatin Other (See Comments)   Niacin     Other Reaction(s): break out in hives   Levaquin [Levofloxacin In D5w] Rash   Niaspan [Niacin Er (Antihyperlipidemic)] Rash and Other (See Comments)    Flushing reaction   Tramadol Rash    HOME MEDICATIONS: Current Outpatient Medications  Medication Sig Dispense Refill   ascorbic acid (VITAMIN C) 500 MG tablet Take 500 mg by mouth daily.     Calcium Carb-Cholecalciferol (CALCIUM 1000 + D PO) Take by mouth.     clopidogrel (PLAVIX) 75 MG tablet Take 1 tablet (75 mg total) by mouth daily. **Restart Plavix on 04/29/2021** 90 tablet 3   Continuous Blood Gluc Sensor (FREESTYLE LIBRE 3 SENSOR) MISC      DULoxetine (CYMBALTA) 30 MG capsule Take 30 mg by mouth daily.     Ferrous Sulfate Dried (HIGH POTENCY IRON) 65 MG TABS Take by mouth.     gabapentin (NEURONTIN) 100 MG capsule Take 100 mg by mouth at bedtime.     HYDROcodone-acetaminophen (NORCO) 5-325 MG tablet Take 1 tablet by mouth every 8 (eight) hours  as needed for moderate pain. 10 tablet 0   meloxicam (MOBIC) 15 MG tablet Take 15 mg by mouth daily.     metFORMIN (GLUCOPHAGE) 500 MG tablet Take 2 tablets (1,000 mg total) by mouth 2 (two) times daily with a meal. Resume 10/03/2020 (Patient taking differently: Take 500 mg by mouth 2 (two) times daily with a meal. Resume 10/03/2020)  1   metoprolol succinate (TOPROL-XL) 25 MG 24 hr tablet Take 25 mg by mouth daily.     MULTIPLE MINERALS PO Take by mouth.     nitroGLYCERIN (NITROSTAT) 0.4 MG SL tablet Place 1 tablet (0.4 mg total) under the tongue every 5 (five) minutes as needed for chest pain. 30 tablet 3   pregabalin (LYRICA) 75 MG capsule 1 capsule Oral TID     rOPINIRole (REQUIP) 4 MG tablet Take 4 mg by mouth at bedtime.     rosuvastatin (CRESTOR) 40 MG tablet TAKE 1 TABLET(40 MG) BY MOUTH DAILY 90 tablet 3   Semaglutide, 1 MG/DOSE, (OZEMPIC, 1 MG/DOSE,) 4 MG/3ML SOPN inject 1mg  Subcutaneous weekly     traMADol (ULTRAM) 50 MG tablet 1 tablet Oral Once a  day     TRESIBA FLEXTOUCH 100 UNIT/ML FlexTouch Pen daily.     vitamin B-12 (CYANOCOBALAMIN) 500 MCG tablet Take 500 mcg by mouth daily.     No current facility-administered medications for this visit.    PAST MEDICAL HISTORY: Past Medical History:  Diagnosis Date   Anemia    as an infant   Arthritis    Back pain    Depression    Disc displacement, lumbar    Gynecomastia    History of kidney stones    Hyperlipidemia    Hypertension    no longer on medications   Hypertriglyceridemia    Insomnia    Low back pain    Lumbar radiculopathy    Lumbar stenosis    OA (osteoarthritis)    Polyneuropathy in diabetes(357.2)    Prostate cancer (HCC) 2003   Restless legs    Ringing in ears    RLS (restless legs syndrome)    Type 2 diabetes mellitus (HCC)     PAST SURGICAL HISTORY: Past Surgical History:  Procedure Laterality Date   ABDOMINAL EXPOSURE N/A 07/13/2019   Procedure: ABDOMINAL EXPOSURE;  Surgeon: Nada Libman, MD;   Location: [facility] OR;  Service: Vascular;  Laterality: N/A;  anterior approach   ANTERIOR LAT LUMBAR FUSION Right 07/13/2019   Procedure: Right Lumbar 3-4 Lumbar 4-5 Anterolateral lumbar interbody fusion;  Surgeon: Maeola Harman, MD;  Location: [facility] OR;  Service: Neurosurgery;  Laterality: Right;   ANTERIOR LUMBAR FUSION N/A 07/13/2019   Procedure: Lumbar Five-Sacral One Anterior lumbar interbody fusion;  Surgeon: Maeola Harman, MD;  Location: [facility] OR;  Service: Neurosurgery;  Laterality: N/A;  Anterior approach   APPLICATION OF INTRAOPERATIVE CT SCAN N/A 04/24/2021   Procedure: APPLICATION OF INTRAOPERATIVE CT SCAN;  Surgeon: Dawley, Alan Mulder, DO;  Location: [facility] OR;  Service: Neurosurgery;  Laterality: N/A;  3C/RM 21   CHOLECYSTECTOMY     COLONOSCOPY     CORONARY STENT INTERVENTION N/A 10/01/2020   Procedure: CORONARY STENT INTERVENTION;  Surgeon: Elder Negus, MD;  Location: [facility] INVASIVE CV LAB;  Service: Cardiovascular;  Laterality: N/A;   EYE SURGERY Bilateral    GALLBLADDER SURGERY     HEMORRHOID SURGERY  1983   LEFT HEART CATH AND CORONARY ANGIOGRAPHY N/A 09/17/2020   Procedure: LEFT HEART CATH AND CORONARY ANGIOGRAPHY;  Surgeon: Elder Negus, MD;  Location: [facility] INVASIVE CV LAB;  Service: Cardiovascular;  Laterality: N/A;   LUMBAR LAMINECTOMY/DECOMPRESSION MICRODISCECTOMY N/A 10/02/2016   Procedure: L3 to S1 Laminectomy;  Surgeon: Maeola Harman, MD;  Location: [facility] OR;  Service: Neurosurgery;  Laterality: N/A;  L3 to S1 Laminectomy   LUMBAR LAMINECTOMY/DECOMPRESSION MICRODISCECTOMY Bilateral 09/07/2019   Procedure: Bilateral Lumbar Five - Sacral One Foraminotomy;  Surgeon: Maeola Harman, MD;  Location: [facility] OR;  Service: Neurosurgery;  Laterality: Bilateral;  posterior   LUMBAR PERCUTANEOUS PEDICLE SCREW 3 LEVEL N/A 07/13/2019   Procedure: Percutaneous pedicle screw fixation from Lumbar 3 to Sacral 1;  Surgeon: Maeola Harman, MD;  Location: [facility] OR;  Service: Neurosurgery;  Laterality: N/A;   MENISCUS  REPAIR Right    PROSTATE SURGERY     SHOULDER SURGERY Left 01/2011   rotator cuff    FAMILY HISTORY: Family History  Problem Relation Age of Onset   Arthritis Mother    Hypertension Mother    Diabetes Mother    Heart attack Mother    Cancer Father     SOCIAL HISTORY: Social History   Socioeconomic History   Marital status:  Married    Spouse name: Jola Babinski   Number of children: 3   Years of education: 12   Highest education level: Not on file  Occupational History   Occupation: Chief Strategy Officer: [employer]  Tobacco Use   Smoking status: Some Days    Types: Cigars   Smokeless tobacco: Never   Tobacco comments:    occasional  Vaping Use   Vaping status: Never Used  Substance and Sexual Activity   Alcohol use: Not Currently    Comment: social-beer   Drug use: No   Sexual activity: Not on file  Other Topics Concern   Not on file  Social History Narrative   Patient is married Jola Babinski) and lives at home with his wife.   Patient has three children and two- step children.   Patient is working full-time.   Patient has a high school education.   Patient is right-handed.   Patient drinks two cups of coffee, 2-3 sodas and one cup of tea daily.   Social Determinants of Health   Financial Resource Strain: Not on file  Food Insecurity: Not on file  Transportation Needs: Not on file  Physical Activity: Not on file  Stress: Not on file  Social Connections: Not on file  Intimate Partner Violence: Not on file      Levert Feinstein, M.D. Ph.D.  [facility] [address], [address] [place], [place] [address] Ph: [phone] Fax: [phone]  CC:  Adrian Prince, MD [address] [place],  [place] [address]  Adrian Prince, MD

## 2023-01-15 NOTE — Procedures (Signed)
Full Name: Harry Zamora Gender: Male MRN #: [id number] Date of Birth: August 30, 1940    Visit Date: 01/15/2023 09:31 Age: 82 Years Examining Physician: Dr. Levert Feinstein Referring Physician: Dr. Levert Feinstein Height: 6 feet 2 inch History: 82 year old male with history of diabetes presenting with bilateral feet numbness, frequent falling, orthostatic hypotension  Summary of the test:  Nerve conduction study: Bilateral sural, superficial peroneal sensory responses were absent.  Left medial sensory response was absent.  Right median sensory response showed moderately prolonged peak latency with moderately decreased snap amplitude.  Bilateral ulnar sensory responses showed mild to moderately prolonged peak latency with mildly decreased snap amplitude.  Right radial sensory responses were normal.  Bilateral tibial motor responses were normal.  Left peroneal to EDB motor response was absent.  Right peroneal to EDB motor response showed moderately decreased CMAP amplitude at proximal stimulation site and conduction velocity.  Bilateral ulnar motor responses were normal.  Bilateral median motor responses showed moderately prolonged distal latency, within normal range CMAP amplitude  Electromyography:  Selected needle examinations of bilateral lower extremity muscles and lumbosacral paraspinal muscles were performed.  There is evidence of chronic neuropathic changes involving bilateral L4, L5 myotomes.  There is no evidence of active process  Well-healed scar along midline lumbar spine, there was no spontaneous activity at lumbosacral paraspinal muscles.  Conclusion: This is an abnormal study.  There is electrodiagnostic evidence of mild axonal sensorimotor polyneuropathy.  In addition, there is evidence of chronic bilateral L4-5 lumbar radiculopathy.    ------------------------------- Levert Feinstein M.D. PhD  [facility] [address], [address] [place], [place]  [address] Tel: [phone] Fax: [phone]  Verbal informed consent was obtained from the patient, patient was informed of potential risk of procedure, including bruising, bleeding, hematoma formation, infection, muscle weakness, muscle pain, numbness, among others.        MNC    Nerve / Sites Muscle Latency Ref. Amplitude Ref. Rel Amp Segments Distance Velocity Ref. Area    ms ms mV mV %  cm m/s m/s mVms  R Median - APB     Wrist APB 5.4 <=4.4 5.9 >=4.0 100 Wrist - APB 7   25.6     Upper arm APB 11.4  6.1  103 Upper arm - Wrist 29.6 50 >=49 25.5  L Median - APB     Wrist APB 6.6 <=4.4 4.0 >=4.0 100 Wrist - APB 7   12.7     Upper arm APB 12.4  4.2  104 Upper arm - Wrist 28.6 50 >=49 13.3  R Ulnar - ADM     Wrist ADM 3.2 <=3.3 9.4 >=6.0 100 Wrist - ADM 7   31.4     B.Elbow ADM 5.7  7.0  73.9 B.Elbow - Wrist 13 53 >=49 23.6     A.Elbow ADM 10.2  8.6  124 A.Elbow - B.Elbow 18 40 >=49 31.0  L Ulnar - ADM     Wrist ADM 3.1 <=3.3 8.3 >=6.0 100 Wrist - ADM 7   24.4     B.Elbow ADM 5.8  7.5  90.7 B.Elbow - Wrist 14 53 >=49 22.3     A.Elbow ADM 9.3  7.3  97.1 A.Elbow - B.Elbow 16 45 >=49 21.7  R Peroneal - EDB     Ankle EDB 6.0 <=6.5 2.0 >=2.0 100 Ankle - EDB 9   4.5     Fib head EDB 13.3  1.1  53.2 Fib head - Ankle  28.6 39 >=44 2.9     Pop fossa EDB 16.4  1.2  110 Pop fossa - Fib head 11 35 >=44 2.9         Pop fossa - Ankle      L Peroneal - EDB     Ankle EDB NR <=6.5 NR >=2.0 NR Ankle - EDB 9   NR         Pop fossa - Ankle      R Tibial - AH     Ankle AH 5.0 <=5.8 8.8 >=4.0 100 Ankle - AH 9   23.5     Pop fossa AH 15.9  5.3  61 Pop fossa - Ankle 42.6 39 >=41 20.3  L Tibial - AH     Ankle AH 4.3 <=5.8 6.7 >=4.0 100 Ankle - AH 9   20.9     Pop fossa AH 15.1  6.9  103 Pop fossa - Ankle 40.6 37 >=41 19.4                     SNC    Nerve / Sites Rec. Site Peak Lat Ref.  Amp Ref. Segments Distance    ms ms V V  cm  R Radial - Anatomical snuff box (Forearm)     Forearm Wrist 2.9  <=2.9 18 >=15 Forearm - Wrist 10  L Sural - Ankle (Calf)     Calf Ankle NR <=4.4 NR >=6 Calf - Ankle 14  R Sural - Ankle (Calf)     Calf Ankle NR <=4.4 NR >=6 Calf - Ankle 14  R Superficial peroneal - Ankle     Lat leg Ankle NR <=4.4 NR >=6 Lat leg - Ankle 14  L Superficial peroneal - Ankle     Lat leg Ankle NR <=4.4 NR >=6 Lat leg - Ankle 14  R Median - Orthodromic (Dig II, Mid palm)     Dig II Wrist 4.6 <=3.4 6 >=10 Dig II - Wrist 13  L Median - Orthodromic (Dig II, Mid palm)     Dig II Wrist NR <=3.4 NR >=10 Dig II - Wrist 13  R Ulnar - Orthodromic, (Dig V, Mid palm)     Dig V Wrist 3.5 <=3.1 5 >=5 Dig V - Wrist 11  L Ulnar - Orthodromic, (Dig V, Mid palm)     Dig V Wrist 4.0 <=3.1 4 >=5 Dig V - Wrist 51                       F  Wave    Nerve F Lat Ref.   ms ms  R Tibial - AH 62.4 <=56.0  L Tibial - AH 62.4 <=56.0  R Ulnar - ADM 34.5 <=32.0  L Ulnar - ADM 33.3 <=32.0             EMG Summary Table    Spontaneous MUAP Recruitment  Muscle IA Fib PSW Fasc Other Amp Dur. Poly Pattern  R. Tibialis anterior Normal None None None _______ Normal Normal Normal Reduced  R. Tibialis posterior Normal None None None _______ Normal Normal Normal Reduced  R. Peroneus longus Normal None None None _______ Normal Normal Normal Reduced  R. Gastrocnemius (Medial head) Normal None None None _______ Normal Normal Normal Reduced  R. Vastus lateralis Normal None None None _______ Normal Normal Normal Reduced  L. Tibialis anterior Normal None None None _______ Normal Normal Normal Reduced  L. Tibialis posterior Normal None  None None _______ Normal Normal Normal Reduced  L. Gastrocnemius (Medial head) Normal None None None _______ Normal Normal Normal Reduced  L. Vastus lateralis Normal None None None _______ Normal Normal Normal Reduced  L. Peroneus longus Normal None None None _______ Normal Normal Normal Reduced  L. Lumbar paraspinals (low) Normal None None None _______ Normal Normal Normal Normal   L. Lumbar paraspinals (mid) Normal None None None _______ Normal Normal Normal Normal  R. Lumbar paraspinals (low) Normal None None None _______ Normal Normal Normal Normal  R. Lumbar paraspinals (mid) Normal None None None _______ Normal Normal Normal Normal  R. Abductor hallucis Increased None None None _______ Normal Normal Normal Reduced

## 2023-01-18 ENCOUNTER — Ambulatory Visit
Admission: RE | Admit: 2023-01-18 | Discharge: 2023-01-18 | Disposition: A | Discharge status: Home / Self Care | Payer: Medicare HMO | Source: Ambulatory Visit | Attending: Neurology | Admitting: Neurology

## 2023-01-18 DIAGNOSIS — R410 Disorientation, unspecified: Secondary | ICD-10-CM

## 2023-01-18 DIAGNOSIS — G6289 Other specified polyneuropathies: Secondary | ICD-10-CM | POA: Diagnosis not present

## 2023-01-18 DIAGNOSIS — I951 Orthostatic hypotension: Secondary | ICD-10-CM

## 2023-01-18 DIAGNOSIS — R269 Unspecified abnormalities of gait and mobility: Secondary | ICD-10-CM

## 2023-01-21 ENCOUNTER — Telehealth: Payer: Self-pay

## 2023-01-21 NOTE — Telephone Encounter (Signed)
Conclusion: This is an abnormal study.  There is electrodiagnostic evidence of mild axonal sensorimotor polyneuropathy.  In addition, there is evidence of chronic bilateral L4-5 lumbar radiculopathy.   Please call patient, EMG nerve conduction study from January 15, 2023 demonstrated mild length-dependent axonal sensorimotor polyneuropathy, in addition, there is evidence of mild bilateral lumbar radiculopathy that is related to his previous low back issue, did have a history of lumbar decompression surgery in the past  His neuropathy most likely related to his diabetes, which is under suboptimal control, goal A1c should be less than 7, 2 months ago it was 7.5, was even higher up to 9.52 years ago  Diabetic neuropathy can cause orthostatic symptoms in patient, decreased blood pressure when standing up, advised him to have best control of his diabetes, increase water intake, tight stocking

## 2023-01-21 NOTE — Telephone Encounter (Signed)
Patient's wife (on [place]) called to see if she could speak with clinical staff to get a synopsis on patients NCV EMG results. She was not able to attend appt with patient and she wants to make sure he is ok to drive. She can be called back at [phone]

## 2023-01-26 NOTE — Telephone Encounter (Signed)
He should be able to drive, if his family feel comfortable for him to continue to drive. He is at high risk for orthostatic hypotension, passing out episode when he step out of car after prolonged sitting down

## 2023-01-26 NOTE — Telephone Encounter (Signed)
I spoke with the patient's wife, Jola Babinski (per [facility]). I explained the results of the EMG/NCV. I detailed information regarding patient's orthostatic hypotension. I relayed the message from Dr. Terrace Arabia regarding driving. They will call back with any further questions or concerns. She verbalized understanding of the findings and expressed appreciation for the call.

## 2023-02-01 DIAGNOSIS — N200 Calculus of kidney: Secondary | ICD-10-CM | POA: Diagnosis not present

## 2023-02-01 DIAGNOSIS — E1140 Type 2 diabetes mellitus with diabetic neuropathy, unspecified: Secondary | ICD-10-CM | POA: Diagnosis not present

## 2023-02-01 DIAGNOSIS — R109 Unspecified abdominal pain: Secondary | ICD-10-CM | POA: Diagnosis not present

## 2023-02-03 ENCOUNTER — Other Ambulatory Visit (HOSPITAL_COMMUNITY): Payer: Self-pay

## 2023-02-03 DIAGNOSIS — I129 Hypertensive chronic kidney disease with stage 1 through stage 4 chronic kidney disease, or unspecified chronic kidney disease: Secondary | ICD-10-CM | POA: Diagnosis not present

## 2023-02-03 DIAGNOSIS — E1140 Type 2 diabetes mellitus with diabetic neuropathy, unspecified: Secondary | ICD-10-CM | POA: Diagnosis not present

## 2023-02-03 DIAGNOSIS — Z23 Encounter for immunization: Secondary | ICD-10-CM | POA: Diagnosis not present

## 2023-02-03 DIAGNOSIS — N1831 Chronic kidney disease, stage 3a: Secondary | ICD-10-CM | POA: Diagnosis not present

## 2023-02-03 DIAGNOSIS — I719 Aortic aneurysm of unspecified site, without rupture: Secondary | ICD-10-CM | POA: Diagnosis not present

## 2023-02-03 DIAGNOSIS — Z794 Long term (current) use of insulin: Secondary | ICD-10-CM | POA: Diagnosis not present

## 2023-02-03 DIAGNOSIS — R911 Solitary pulmonary nodule: Secondary | ICD-10-CM | POA: Diagnosis not present

## 2023-02-03 DIAGNOSIS — R296 Repeated falls: Secondary | ICD-10-CM | POA: Diagnosis not present

## 2023-02-03 DIAGNOSIS — I951 Orthostatic hypotension: Secondary | ICD-10-CM | POA: Diagnosis not present

## 2023-02-03 MED ORDER — TRULICITY 3 MG/0.5ML ~~LOC~~ SOAJ
3.0000 mg | SUBCUTANEOUS | 3 refills | Status: AC
  Filled 2023-02-03: qty 2, 28d supply, fill #0
  Filled 2023-03-12 – 2023-05-03 (×2): qty 2, 28d supply, fill #1
  Filled 2023-06-09: qty 4, 56d supply, fill #2

## 2023-02-08 ENCOUNTER — Other Ambulatory Visit (HOSPITAL_COMMUNITY): Payer: Self-pay

## 2023-02-21 ENCOUNTER — Emergency Department (HOSPITAL_BASED_OUTPATIENT_CLINIC_OR_DEPARTMENT_OTHER)
Admission: EM | Admit: 2023-02-21 | Discharge: 2023-02-21 | Disposition: A | Discharge status: Home / Self Care | Payer: Medicare HMO | Attending: Emergency Medicine | Admitting: Emergency Medicine

## 2023-02-21 ENCOUNTER — Emergency Department (HOSPITAL_BASED_OUTPATIENT_CLINIC_OR_DEPARTMENT_OTHER): Payer: Medicare HMO

## 2023-02-21 ENCOUNTER — Encounter (HOSPITAL_BASED_OUTPATIENT_CLINIC_OR_DEPARTMENT_OTHER): Payer: Self-pay

## 2023-02-21 DIAGNOSIS — H538 Other visual disturbances: Secondary | ICD-10-CM | POA: Diagnosis not present

## 2023-02-21 DIAGNOSIS — Z7902 Long term (current) use of antithrombotics/antiplatelets: Secondary | ICD-10-CM | POA: Insufficient documentation

## 2023-02-21 DIAGNOSIS — R944 Abnormal results of kidney function studies: Secondary | ICD-10-CM | POA: Diagnosis not present

## 2023-02-21 DIAGNOSIS — Y9301 Activity, walking, marching and hiking: Secondary | ICD-10-CM | POA: Diagnosis not present

## 2023-02-21 DIAGNOSIS — Z7984 Long term (current) use of oral hypoglycemic drugs: Secondary | ICD-10-CM | POA: Insufficient documentation

## 2023-02-21 DIAGNOSIS — S0993XA Unspecified injury of face, initial encounter: Secondary | ICD-10-CM | POA: Diagnosis present

## 2023-02-21 DIAGNOSIS — G238 Other specified degenerative diseases of basal ganglia: Secondary | ICD-10-CM | POA: Diagnosis not present

## 2023-02-21 DIAGNOSIS — M4312 Spondylolisthesis, cervical region: Secondary | ICD-10-CM | POA: Diagnosis not present

## 2023-02-21 DIAGNOSIS — Z043 Encounter for examination and observation following other accident: Secondary | ICD-10-CM | POA: Diagnosis not present

## 2023-02-21 DIAGNOSIS — Z79899 Other long term (current) drug therapy: Secondary | ICD-10-CM | POA: Insufficient documentation

## 2023-02-21 DIAGNOSIS — W19XXXA Unspecified fall, initial encounter: Secondary | ICD-10-CM | POA: Diagnosis not present

## 2023-02-21 DIAGNOSIS — S0083XA Contusion of other part of head, initial encounter: Secondary | ICD-10-CM | POA: Insufficient documentation

## 2023-02-21 DIAGNOSIS — M5030 Other cervical disc degeneration, unspecified cervical region: Secondary | ICD-10-CM | POA: Diagnosis not present

## 2023-02-21 LAB — BASIC METABOLIC PANEL
Anion gap: 6 (ref 5–15)
BUN: 30 mg/dL — ABNORMAL HIGH (ref 8–23)
CO2: 30 mmol/L (ref 22–32)
Calcium: 8.8 mg/dL — ABNORMAL LOW (ref 8.9–10.3)
Chloride: 101 mmol/L (ref 98–111)
Creatinine, Ser: 1.48 mg/dL — ABNORMAL HIGH (ref 0.61–1.24)
GFR, Estimated: 47 mL/min — ABNORMAL LOW (ref >60)
Glucose, Bld: 125 mg/dL — ABNORMAL HIGH (ref 70–99)
Potassium: 4.9 mmol/L (ref 3.5–5.1)
Sodium: 137 mmol/L (ref 135–145)

## 2023-02-21 LAB — CBC WITH DIFFERENTIAL/PLATELET
Abs Immature Granulocytes: 0.01 10*3/uL (ref 0.00–0.07)
Basophils Absolute: 0 10*3/uL (ref 0.0–0.1)
Basophils Relative: 0 %
Eosinophils Absolute: 0.2 10*3/uL (ref 0.0–0.5)
Eosinophils Relative: 3 %
HCT: 33.6 % — ABNORMAL LOW (ref 39.0–52.0)
Hemoglobin: 10.9 g/dL — ABNORMAL LOW (ref 13.0–17.0)
Immature Granulocytes: 0 %
Lymphocytes Relative: 22 %
Lymphs Abs: 1.5 10*3/uL (ref 0.7–4.0)
MCH: 31.5 pg (ref 26.0–34.0)
MCHC: 32.4 g/dL (ref 30.0–36.0)
MCV: 97.1 fL (ref 80.0–100.0)
Monocytes Absolute: 0.5 10*3/uL (ref 0.1–1.0)
Monocytes Relative: 8 %
Neutro Abs: 4.4 10*3/uL (ref 1.7–7.7)
Neutrophils Relative %: 67 %
Platelets: 175 10*3/uL (ref 150–400)
RBC: 3.46 MIL/uL — ABNORMAL LOW (ref 4.22–5.81)
RDW: 12.7 % (ref 11.5–15.5)
WBC: 6.7 10*3/uL (ref 4.0–10.5)
nRBC: 0 % (ref 0.0–0.2)

## 2023-02-21 NOTE — ED Triage Notes (Signed)
He states his "legs gave out and I fell" yesterday evening. He fell forward, thereby striking his head on a concrete surface. He has abrasion(s) at bridge of nose area. He denies l.o.c., however, he c/o "blurry vision a little bit this morning, but it's cleared up now". He is alert and oriented x 4 with clear speech.

## 2023-02-21 NOTE — Discharge Instructions (Signed)
While you are in the emergency department you had CT imaging done which was negative.  You did not have any fractures or blood in your brain.  Congratulations, you are officially hardheaded!  Your blood work showed that you are likely little dehydrated.  Make sure that you are getting plenty of water to drink over the next few days.  Please follow-up with your primary care doctor next week for repeat labs.

## 2023-02-21 NOTE — ED Provider Notes (Signed)
[facility] AT [facility] Provider Note   CSN: [id number] Arrival date & time: 02/21/23  3664     History  Chief Complaint  Patient presents with   Marletta Lor    Harry Zamora is a 82 y.o. male.  This is an 82 year old male presents emergency department after he had a nonsyncopal fall from standing last evening.  Patient reportedly was walking, and "my legs gave out" and he fell to the ground striking his face.  He takes Plavix.  He did not lose consciousness.  He reports that he felt like his vision was a little bit blurry this morning, but now it is back to normal.  Family at bedside with patient.  They state the patient has had issues with falling for several months.  He is followed with his primary care doctor, has had an MRI.   Fall       Home Medications Prior to Admission medications   Medication Sig Start Date End Date Taking? Authorizing Provider  ascorbic acid (VITAMIN C) 500 MG tablet Take 500 mg by mouth daily.    [provider]  Calcium Carb-Cholecalciferol (CALCIUM 1000 + D PO) Take by mouth.    [provider]  clopidogrel (PLAVIX) 75 MG tablet Take 1 tablet (75 mg total) by mouth daily. **Restart Plavix on 04/29/2021** 04/26/21   Val Eagle D, NP  Continuous Blood Gluc Sensor (FREESTYLE LIBRE 3 SENSOR) MISC  04/13/22   [provider]  Dulaglutide (TRULICITY) 3 MG/0.5ML SOAJ Inject 3 mg into the skin once a week. 02/03/23     DULoxetine (CYMBALTA) 30 MG capsule Take 30 mg by mouth daily. 06/05/21   [provider]  Ferrous Sulfate Dried (HIGH POTENCY IRON) 65 MG TABS Take by mouth.    [provider]  gabapentin (NEURONTIN) 100 MG capsule Take 100 mg by mouth at bedtime. 10/09/14   [provider]  HYDROcodone-acetaminophen (NORCO) 5-325 MG tablet Take 1 tablet by mouth every 8 (eight) hours as needed for moderate pain. 04/30/22   Horton, Clabe Seal, DO  meloxicam (MOBIC) 15 MG tablet  Take 15 mg by mouth daily.    [provider]  metFORMIN (GLUCOPHAGE) 500 MG tablet Take 2 tablets (1,000 mg total) by mouth 2 (two) times daily with a meal. Resume 10/03/2020 Patient taking differently: Take 500 mg by mouth 2 (two) times daily with a meal. Resume 10/03/2020 10/03/20   Patwardhan, Anabel Bene, MD  metoprolol succinate (TOPROL-XL) 25 MG 24 hr tablet Take 25 mg by mouth daily. 04/28/22   [provider]  MULTIPLE MINERALS PO Take by mouth.    [provider]  nitroGLYCERIN (NITROSTAT) 0.4 MG SL tablet Place 1 tablet (0.4 mg total) under the tongue every 5 (five) minutes as needed for chest pain. 09/02/20 12/21/22  Patwardhan, Anabel Bene, MD  pregabalin (LYRICA) 75 MG capsule 1 capsule Oral TID    [provider]  rOPINIRole (REQUIP) 4 MG tablet Take 4 mg by mouth at bedtime.    [provider]  rosuvastatin (CRESTOR) 40 MG tablet TAKE 1 TABLET(40 MG) BY MOUTH DAILY 06/25/22   Patwardhan, Manish J, MD  Semaglutide, 1 MG/DOSE, (OZEMPIC, 1 MG/DOSE,) 4 MG/3ML SOPN inject 1mg  Subcutaneous weekly    [provider]  traMADol (ULTRAM) 50 MG tablet 1 tablet Oral Once a day    [provider]  TRESIBA FLEXTOUCH 100 UNIT/ML FlexTouch Pen daily.    [provider]  vitamin B-12 (CYANOCOBALAMIN)  500 MCG tablet Take 500 mcg by mouth daily.    [provider]      Allergies    Atorvastatin, Niacin, Levaquin [levofloxacin in d5w], Niaspan [niacin er (antihyperlipidemic)], and Tramadol    Review of Systems   Review of Systems  Physical Exam Updated Vital Signs BP (!) 117/59 (BP Location: Right Arm)   Pulse 73   Temp 98 F (36.7 C) (Oral)   Resp 16   SpO2 100%  Physical Exam Vitals reviewed.  HENT:     Head: Normocephalic.     Comments: Abrasion over the right side of the forehead, abrasion over the nasal bridge.  No septal hematoma. Eyes:     Conjunctiva/sclera: Conjunctivae normal.     Pupils: Pupils are equal,  round, and reactive to light.     Comments: Vision grossly normal  Abdominal:     General: Abdomen is flat.     Palpations: Abdomen is soft.  Musculoskeletal:        General: No swelling, tenderness or deformity. Normal range of motion.     Cervical back: Normal range of motion and neck supple. No tenderness.  Neurological:     General: No focal deficit present.     Mental Status: He is alert and oriented to person, place, and time.     Cranial Nerves: No cranial nerve deficit.     Motor: No weakness.     Gait: Gait normal.     ED Results / Procedures / Treatments   Labs (all labs ordered are listed, but only abnormal results are displayed) Labs Reviewed  CBC WITH DIFFERENTIAL/PLATELET - Abnormal; Notable for the following components:      Result Value   RBC 3.46 (*)    Hemoglobin 10.9 (*)    HCT 33.6 (*)    All other components within normal limits  BASIC METABOLIC PANEL - Abnormal; Notable for the following components:   Glucose, Bld 125 (*)    BUN 30 (*)    Creatinine, Ser 1.48 (*)    Calcium 8.8 (*)    GFR, Estimated 47 (*)    All other components within normal limits    EKG None  Radiology CT Cervical Spine Wo Contrast  Result Date: 02/21/2023 CLINICAL DATA:  82 year old male status post fall yesterday, legs gave out. Abrasions. Blurred vision. EXAM: CT CERVICAL SPINE WITHOUT CONTRAST TECHNIQUE: Multidetector CT imaging of the cervical spine was performed without intravenous contrast. Multiplanar CT image reconstructions were also generated. RADIATION DOSE REDUCTION: This exam was performed according to the departmental dose-optimization program which includes automated exposure control, adjustment of the mA and/or kV according to patient size and/or use of iterative reconstruction technique. COMPARISON:  CT head and face today.  Cervical spine CT 04/06/2022. FINDINGS: Alignment: Chronic straightening but mildly increased reversal of cervical lordosis now.  Cervicothoracic junction alignment is within normal limits. Mild chronic degenerative appearing anterolisthesis at both C3-C4 and C4-C5 with chronic facet degeneration. Bilateral posterior element alignment is within normal limits. Skull base and vertebrae: Bone mineralization is within normal limits. Visualized skull base is intact. No atlanto-occipital dissociation. C1 and C2 appear intact and aligned. No acute osseous abnormality identified. Soft tissues and spinal canal: No prevertebral fluid or swelling. No visible canal hematoma. Negative visible noncontrast neck soft tissues aside from carotid atherosclerosis. 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 Upper chest: Negative. IMPRESSION: 1. No acute traumatic injury identified in the  cervical spine. 2. 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 Electronically Signed   By: Odessa Fleming M.D.   On: 02/21/2023 09:15   CT Maxillofacial Wo Contrast  Result Date: 02/21/2023 CLINICAL DATA:  82 year old male status post fall yesterday, legs gave out. Abrasions. Blurred vision. EXAM: CT MAXILLOFACIAL WITHOUT CONTRAST TECHNIQUE: Multidetector CT imaging of the maxillofacial structures was performed. Multiplanar CT image reconstructions were also generated. RADIATION DOSE REDUCTION: This exam was performed according to the departmental dose-optimization program which includes automated exposure control, adjustment of the mA and/or kV according to patient size and/or use of iterative reconstruction technique. COMPARISON:  CT head and cervical spine today. Prior face CT 11/02/2022. FINDINGS: Osseous: Mandible intact and normally located. Stable dentition. Bilateral maxilla, zygoma, pterygoid and nasal bones appear stable and intact. Central skull base appears intact. Orbits: No acute orbital wall  fracture. Chronic postoperative changes to the globes. No acute orbit soft tissue finding. Sinuses: Visualized paranasal sinuses and mastoids are stable and well aerated. Soft tissues: Negative visible noncontrast larynx, pharynx, parapharyngeal spaces, retropharyngeal space, sublingual space. Partial submandibular gland atrophy is stable. Masticator and parotid spaces appear negative. Calcified atherosclerosis. No soft tissue gas or superficial soft tissue injury identified other than the right forehead reported separately. Limited intracranial: Stable to that reported separately. IMPRESSION: No acute traumatic injury identified in the Face. Electronically Signed   By: Odessa Fleming M.D.   On: 02/21/2023 09:13   CT Head Wo Contrast  Result Date: 02/21/2023 CLINICAL DATA:  82 year old male status post fall yesterday, legs gave out. Abrasions. Blurred vision. EXAM: CT HEAD WITHOUT CONTRAST TECHNIQUE: Contiguous axial images were obtained from the base of the skull through the vertex without intravenous contrast. RADIATION DOSE REDUCTION: This exam was performed according to the departmental dose-optimization program which includes automated exposure control, adjustment of the mA and/or kV according to patient size and/or use of iterative reconstruction technique. COMPARISON:  Brain MRI 01/18/2023 and earlier. Face and cervical spine CT today reported separately. FINDINGS: Brain: Stable cerebral volume. Stable basal ganglia vascular calcifications. No midline shift, mass effect, No midline shift, ventriculomegaly, mass effect, evidence of mass lesion, intracranial hemorrhage or evidence of cortically based acute infarction. Stable gray-white matter differentiation throughout the brain. Mild for age white matter hypodensity. Vascular: Calcified atherosclerosis at the skull base. No suspicious intracranial vascular hyperdensity. Skull: Stable.  No fracture identified. Sinuses/Orbits: Visualized paranasal sinuses and  mastoids are stable and well aerated. Other: Mild right forehead scalp hematoma or contusion series 3, image 18. Underlying right frontal bone intact. No scalp soft tissue gas. IMPRESSION: 1. Mild right forehead scalp hematoma or contusion. No skull fracture. 2. No acute intracranial abnormality. Stable mild for age chronic cerebral white matter changes. Electronically Signed   By: Odessa Fleming M.D.   On: 02/21/2023 09:09    Procedures Procedures    Medications Ordered in ED Medications - No data to display  ED Course/ Medical Decision Making/ A&P                                 Medical Decision Making 82 year old male presents emergency department after nonsyncopal fall from standing last evening.  Differential diagnoses include ICH, skull fracture, nasal fracture, concussion.  Plan-will obtain imaging of the patient's head neck and face.  Patient has no neurological deficits on my exam.  Unfortunately for the patient, these not and frequent falls appear to  be his baseline.  He has been following with his PCP for this.    Reassessment 10 AM-patient with mild elevation in his creatinine.  Counseled on the importance of hydration.  Patient will make sure he is getting plenty of water to drink over the next few days, will follow-up with his PCP for repeat testing.  Hemoglobin at baseline.  CT imaging, per my independent review shows no intracranial hemorrhage.  CT imaging of the neck and face negative.  Patient reports he is up-to-date on his tetanus.  Will discharge home.    Amount and/or Complexity of Data Reviewed Labs: ordered. Radiology: ordered.           Final Clinical Impression(s) / ED Diagnoses Final diagnoses:  Contusion of face, initial encounter    Rx / DC Orders ED Discharge Orders     None         Anders Simmonds T, DO 02/21/23 1002

## 2023-03-13 ENCOUNTER — Other Ambulatory Visit (HOSPITAL_COMMUNITY): Payer: Self-pay

## 2023-03-13 DIAGNOSIS — I959 Hypotension, unspecified: Secondary | ICD-10-CM | POA: Diagnosis not present

## 2023-03-13 DIAGNOSIS — R110 Nausea: Secondary | ICD-10-CM | POA: Diagnosis not present

## 2023-03-13 DIAGNOSIS — W19XXXA Unspecified fall, initial encounter: Secondary | ICD-10-CM | POA: Diagnosis not present

## 2023-03-13 DIAGNOSIS — G4489 Other headache syndrome: Secondary | ICD-10-CM | POA: Diagnosis not present

## 2023-03-14 ENCOUNTER — Inpatient Hospital Stay (HOSPITAL_COMMUNITY): Payer: Medicare HMO

## 2023-03-14 ENCOUNTER — Inpatient Hospital Stay (HOSPITAL_COMMUNITY)
Admission: EM | Admit: 2023-03-14 | Discharge: 2023-03-23 | DRG: 091 | Disposition: A | Discharge status: Rehabilitation Facility | Payer: Medicare HMO | Attending: Student in an Organized Health Care Education/Training Program | Admitting: Student in an Organized Health Care Education/Training Program

## 2023-03-14 ENCOUNTER — Encounter (HOSPITAL_COMMUNITY): Payer: Self-pay | Admitting: Student in an Organized Health Care Education/Training Program

## 2023-03-14 ENCOUNTER — Emergency Department (HOSPITAL_COMMUNITY): Payer: Medicare HMO

## 2023-03-14 ENCOUNTER — Other Ambulatory Visit: Payer: Self-pay

## 2023-03-14 DIAGNOSIS — S3991XA Unspecified injury of abdomen, initial encounter: Secondary | ICD-10-CM | POA: Diagnosis not present

## 2023-03-14 DIAGNOSIS — D649 Anemia, unspecified: Secondary | ICD-10-CM | POA: Diagnosis not present

## 2023-03-14 DIAGNOSIS — R159 Full incontinence of feces: Secondary | ICD-10-CM | POA: Diagnosis present

## 2023-03-14 DIAGNOSIS — I10 Essential (primary) hypertension: Secondary | ICD-10-CM | POA: Diagnosis present

## 2023-03-14 DIAGNOSIS — J101 Influenza due to other identified influenza virus with other respiratory manifestations: Secondary | ICD-10-CM | POA: Diagnosis present

## 2023-03-14 DIAGNOSIS — M50222 Other cervical disc displacement at C5-C6 level: Secondary | ICD-10-CM | POA: Diagnosis not present

## 2023-03-14 DIAGNOSIS — S14109A Unspecified injury at unspecified level of cervical spinal cord, initial encounter: Secondary | ICD-10-CM | POA: Diagnosis not present

## 2023-03-14 DIAGNOSIS — I639 Cerebral infarction, unspecified: Principal | ICD-10-CM | POA: Diagnosis present

## 2023-03-14 DIAGNOSIS — M4312 Spondylolisthesis, cervical region: Secondary | ICD-10-CM | POA: Diagnosis not present

## 2023-03-14 DIAGNOSIS — R32 Unspecified urinary incontinence: Secondary | ICD-10-CM | POA: Diagnosis present

## 2023-03-14 DIAGNOSIS — M47812 Spondylosis without myelopathy or radiculopathy, cervical region: Secondary | ICD-10-CM | POA: Diagnosis not present

## 2023-03-14 DIAGNOSIS — I6782 Cerebral ischemia: Secondary | ICD-10-CM | POA: Diagnosis not present

## 2023-03-14 DIAGNOSIS — E876 Hypokalemia: Secondary | ICD-10-CM | POA: Diagnosis not present

## 2023-03-14 DIAGNOSIS — M545 Low back pain, unspecified: Secondary | ICD-10-CM | POA: Diagnosis present

## 2023-03-14 DIAGNOSIS — I951 Orthostatic hypotension: Secondary | ICD-10-CM | POA: Diagnosis present

## 2023-03-14 DIAGNOSIS — Z881 Allergy status to other antibiotic agents status: Secondary | ICD-10-CM

## 2023-03-14 DIAGNOSIS — M4802 Spinal stenosis, cervical region: Secondary | ICD-10-CM | POA: Diagnosis present

## 2023-03-14 DIAGNOSIS — F32A Depression, unspecified: Secondary | ICD-10-CM | POA: Diagnosis present

## 2023-03-14 DIAGNOSIS — I2510 Atherosclerotic heart disease of native coronary artery without angina pectoris: Secondary | ICD-10-CM | POA: Diagnosis present

## 2023-03-14 DIAGNOSIS — I6502 Occlusion and stenosis of left vertebral artery: Secondary | ICD-10-CM | POA: Diagnosis not present

## 2023-03-14 DIAGNOSIS — G4700 Insomnia, unspecified: Secondary | ICD-10-CM | POA: Diagnosis present

## 2023-03-14 DIAGNOSIS — G8929 Other chronic pain: Secondary | ICD-10-CM | POA: Diagnosis present

## 2023-03-14 DIAGNOSIS — Z7902 Long term (current) use of antithrombotics/antiplatelets: Secondary | ICD-10-CM

## 2023-03-14 DIAGNOSIS — R4189 Other symptoms and signs involving cognitive functions and awareness: Secondary | ICD-10-CM | POA: Diagnosis present

## 2023-03-14 DIAGNOSIS — W1830XA Fall on same level, unspecified, initial encounter: Secondary | ICD-10-CM | POA: Diagnosis present

## 2023-03-14 DIAGNOSIS — G8191 Hemiplegia, unspecified affecting right dominant side: Secondary | ICD-10-CM | POA: Diagnosis not present

## 2023-03-14 DIAGNOSIS — Z9049 Acquired absence of other specified parts of digestive tract: Secondary | ICD-10-CM

## 2023-03-14 DIAGNOSIS — I6621 Occlusion and stenosis of right posterior cerebral artery: Secondary | ICD-10-CM | POA: Diagnosis present

## 2023-03-14 DIAGNOSIS — I700 Atherosclerosis of aorta: Secondary | ICD-10-CM | POA: Diagnosis not present

## 2023-03-14 DIAGNOSIS — S0990XA Unspecified injury of head, initial encounter: Secondary | ICD-10-CM | POA: Diagnosis not present

## 2023-03-14 DIAGNOSIS — H9313 Tinnitus, bilateral: Secondary | ICD-10-CM | POA: Diagnosis present

## 2023-03-14 DIAGNOSIS — R29818 Other symptoms and signs involving the nervous system: Secondary | ICD-10-CM | POA: Diagnosis not present

## 2023-03-14 DIAGNOSIS — Z7984 Long term (current) use of oral hypoglycemic drugs: Secondary | ICD-10-CM

## 2023-03-14 DIAGNOSIS — M1712 Unilateral primary osteoarthritis, left knee: Secondary | ICD-10-CM | POA: Diagnosis present

## 2023-03-14 DIAGNOSIS — I6523 Occlusion and stenosis of bilateral carotid arteries: Secondary | ICD-10-CM | POA: Diagnosis present

## 2023-03-14 DIAGNOSIS — D72829 Elevated white blood cell count, unspecified: Secondary | ICD-10-CM | POA: Diagnosis present

## 2023-03-14 DIAGNOSIS — M5416 Radiculopathy, lumbar region: Secondary | ICD-10-CM | POA: Diagnosis present

## 2023-03-14 DIAGNOSIS — Z8249 Family history of ischemic heart disease and other diseases of the circulatory system: Secondary | ICD-10-CM

## 2023-03-14 DIAGNOSIS — S14103D Unspecified injury at C3 level of cervical spinal cord, subsequent encounter: Secondary | ICD-10-CM | POA: Diagnosis not present

## 2023-03-14 DIAGNOSIS — Y9301 Activity, walking, marching and hiking: Secondary | ICD-10-CM | POA: Diagnosis present

## 2023-03-14 DIAGNOSIS — F0390 Unspecified dementia without behavioral disturbance: Secondary | ICD-10-CM | POA: Diagnosis present

## 2023-03-14 DIAGNOSIS — S0101XA Laceration without foreign body of scalp, initial encounter: Secondary | ICD-10-CM | POA: Diagnosis present

## 2023-03-14 DIAGNOSIS — N200 Calculus of kidney: Secondary | ICD-10-CM | POA: Diagnosis not present

## 2023-03-14 DIAGNOSIS — G2581 Restless legs syndrome: Secondary | ICD-10-CM | POA: Diagnosis not present

## 2023-03-14 DIAGNOSIS — G8250 Quadriplegia, unspecified: Secondary | ICD-10-CM | POA: Diagnosis not present

## 2023-03-14 DIAGNOSIS — F17290 Nicotine dependence, other tobacco product, uncomplicated: Secondary | ICD-10-CM | POA: Diagnosis not present

## 2023-03-14 DIAGNOSIS — S0001XA Abrasion of scalp, initial encounter: Secondary | ICD-10-CM

## 2023-03-14 DIAGNOSIS — D62 Acute posthemorrhagic anemia: Secondary | ICD-10-CM | POA: Diagnosis not present

## 2023-03-14 DIAGNOSIS — E781 Pure hyperglyceridemia: Secondary | ICD-10-CM | POA: Diagnosis not present

## 2023-03-14 DIAGNOSIS — R918 Other nonspecific abnormal finding of lung field: Secondary | ICD-10-CM | POA: Diagnosis not present

## 2023-03-14 DIAGNOSIS — Z7982 Long term (current) use of aspirin: Secondary | ICD-10-CM

## 2023-03-14 DIAGNOSIS — I6389 Other cerebral infarction: Secondary | ICD-10-CM | POA: Diagnosis not present

## 2023-03-14 DIAGNOSIS — R9431 Abnormal electrocardiogram [ECG] [EKG]: Secondary | ICD-10-CM | POA: Diagnosis not present

## 2023-03-14 DIAGNOSIS — K592 Neurogenic bowel, not elsewhere classified: Secondary | ICD-10-CM | POA: Diagnosis not present

## 2023-03-14 DIAGNOSIS — K5900 Constipation, unspecified: Secondary | ICD-10-CM | POA: Diagnosis present

## 2023-03-14 DIAGNOSIS — Z8619 Personal history of other infectious and parasitic diseases: Secondary | ICD-10-CM

## 2023-03-14 DIAGNOSIS — R296 Repeated falls: Secondary | ICD-10-CM | POA: Diagnosis present

## 2023-03-14 DIAGNOSIS — Z885 Allergy status to narcotic agent status: Secondary | ICD-10-CM

## 2023-03-14 DIAGNOSIS — R471 Dysarthria and anarthria: Secondary | ICD-10-CM | POA: Diagnosis present

## 2023-03-14 DIAGNOSIS — R109 Unspecified abdominal pain: Secondary | ICD-10-CM | POA: Diagnosis not present

## 2023-03-14 DIAGNOSIS — S0003XA Contusion of scalp, initial encounter: Secondary | ICD-10-CM | POA: Diagnosis not present

## 2023-03-14 DIAGNOSIS — R0989 Other specified symptoms and signs involving the circulatory and respiratory systems: Secondary | ICD-10-CM | POA: Diagnosis not present

## 2023-03-14 DIAGNOSIS — N179 Acute kidney failure, unspecified: Secondary | ICD-10-CM | POA: Diagnosis not present

## 2023-03-14 DIAGNOSIS — Z809 Family history of malignant neoplasm, unspecified: Secondary | ICD-10-CM

## 2023-03-14 DIAGNOSIS — Z791 Long term (current) use of non-steroidal anti-inflammatories (NSAID): Secondary | ICD-10-CM

## 2023-03-14 DIAGNOSIS — I672 Cerebral atherosclerosis: Secondary | ICD-10-CM | POA: Diagnosis not present

## 2023-03-14 DIAGNOSIS — R270 Ataxia, unspecified: Secondary | ICD-10-CM | POA: Diagnosis not present

## 2023-03-14 DIAGNOSIS — E1142 Type 2 diabetes mellitus with diabetic polyneuropathy: Secondary | ICD-10-CM | POA: Diagnosis present

## 2023-03-14 DIAGNOSIS — Z789 Other specified health status: Secondary | ICD-10-CM

## 2023-03-14 DIAGNOSIS — G238 Other specified degenerative diseases of basal ganglia: Secondary | ICD-10-CM | POA: Diagnosis not present

## 2023-03-14 DIAGNOSIS — Z8546 Personal history of malignant neoplasm of prostate: Secondary | ICD-10-CM

## 2023-03-14 DIAGNOSIS — Y92099 Unspecified place in other non-institutional residence as the place of occurrence of the external cause: Secondary | ICD-10-CM | POA: Diagnosis not present

## 2023-03-14 DIAGNOSIS — Z9889 Other specified postprocedural states: Secondary | ICD-10-CM

## 2023-03-14 DIAGNOSIS — Z79899 Other long term (current) drug therapy: Secondary | ICD-10-CM

## 2023-03-14 DIAGNOSIS — Z794 Long term (current) use of insulin: Secondary | ICD-10-CM | POA: Diagnosis not present

## 2023-03-14 DIAGNOSIS — M542 Cervicalgia: Secondary | ICD-10-CM | POA: Diagnosis present

## 2023-03-14 DIAGNOSIS — Z888 Allergy status to other drugs, medicaments and biological substances status: Secondary | ICD-10-CM

## 2023-03-14 DIAGNOSIS — Z955 Presence of coronary angioplasty implant and graft: Secondary | ICD-10-CM

## 2023-03-14 DIAGNOSIS — Z87442 Personal history of urinary calculi: Secondary | ICD-10-CM

## 2023-03-14 DIAGNOSIS — G9519 Other vascular myelopathies: Secondary | ICD-10-CM | POA: Diagnosis not present

## 2023-03-14 DIAGNOSIS — S14155A Other incomplete lesion at C5 level of cervical spinal cord, initial encounter: Secondary | ICD-10-CM | POA: Diagnosis present

## 2023-03-14 DIAGNOSIS — E1169 Type 2 diabetes mellitus with other specified complication: Secondary | ICD-10-CM | POA: Diagnosis not present

## 2023-03-14 DIAGNOSIS — K5901 Slow transit constipation: Secondary | ICD-10-CM | POA: Diagnosis not present

## 2023-03-14 DIAGNOSIS — R9082 White matter disease, unspecified: Secondary | ICD-10-CM | POA: Diagnosis not present

## 2023-03-14 DIAGNOSIS — M48062 Spinal stenosis, lumbar region with neurogenic claudication: Secondary | ICD-10-CM | POA: Diagnosis present

## 2023-03-14 DIAGNOSIS — Z7985 Long-term (current) use of injectable non-insulin antidiabetic drugs: Secondary | ICD-10-CM

## 2023-03-14 DIAGNOSIS — Z8701 Personal history of pneumonia (recurrent): Secondary | ICD-10-CM

## 2023-03-14 DIAGNOSIS — G894 Chronic pain syndrome: Secondary | ICD-10-CM | POA: Diagnosis not present

## 2023-03-14 DIAGNOSIS — Z833 Family history of diabetes mellitus: Secondary | ICD-10-CM

## 2023-03-14 DIAGNOSIS — H9190 Unspecified hearing loss, unspecified ear: Secondary | ICD-10-CM | POA: Diagnosis present

## 2023-03-14 DIAGNOSIS — Z8261 Family history of arthritis: Secondary | ICD-10-CM

## 2023-03-14 DIAGNOSIS — S199XXA Unspecified injury of neck, initial encounter: Secondary | ICD-10-CM | POA: Diagnosis not present

## 2023-03-14 DIAGNOSIS — N319 Neuromuscular dysfunction of bladder, unspecified: Secondary | ICD-10-CM | POA: Diagnosis not present

## 2023-03-14 DIAGNOSIS — Z9181 History of falling: Secondary | ICD-10-CM

## 2023-03-14 DIAGNOSIS — G459 Transient cerebral ischemic attack, unspecified: Secondary | ICD-10-CM | POA: Diagnosis not present

## 2023-03-14 DIAGNOSIS — R10819 Abdominal tenderness, unspecified site: Secondary | ICD-10-CM | POA: Diagnosis not present

## 2023-03-14 DIAGNOSIS — R3915 Urgency of urination: Secondary | ICD-10-CM | POA: Diagnosis present

## 2023-03-14 DIAGNOSIS — W19XXXA Unspecified fall, initial encounter: Secondary | ICD-10-CM

## 2023-03-14 DIAGNOSIS — S299XXA Unspecified injury of thorax, initial encounter: Secondary | ICD-10-CM | POA: Diagnosis not present

## 2023-03-14 DIAGNOSIS — Z981 Arthrodesis status: Secondary | ICD-10-CM

## 2023-03-14 DIAGNOSIS — I6503 Occlusion and stenosis of bilateral vertebral arteries: Secondary | ICD-10-CM | POA: Diagnosis not present

## 2023-03-14 DIAGNOSIS — Z043 Encounter for examination and observation following other accident: Secondary | ICD-10-CM | POA: Diagnosis not present

## 2023-03-14 DIAGNOSIS — R531 Weakness: Secondary | ICD-10-CM | POA: Diagnosis not present

## 2023-03-14 DIAGNOSIS — J9811 Atelectasis: Secondary | ICD-10-CM | POA: Diagnosis not present

## 2023-03-14 LAB — COMPREHENSIVE METABOLIC PANEL
ALT: 20 U/L (ref 0–44)
AST: 35 U/L (ref 15–41)
Albumin: 3.3 g/dL — ABNORMAL LOW (ref 3.5–5.0)
Alkaline Phosphatase: 64 U/L (ref 38–126)
Anion gap: 9 (ref 5–15)
BUN: 19 mg/dL (ref 8–23)
CO2: 25 mmol/L (ref 22–32)
Calcium: 9.1 mg/dL (ref 8.9–10.3)
Chloride: 102 mmol/L (ref 98–111)
Creatinine, Ser: 1.34 mg/dL — ABNORMAL HIGH (ref 0.61–1.24)
GFR, Estimated: 53 mL/min — ABNORMAL LOW (ref >60)
Glucose, Bld: 74 mg/dL (ref 70–99)
Potassium: 3.8 mmol/L (ref 3.5–5.1)
Sodium: 136 mmol/L (ref 135–145)
Total Bilirubin: 1.4 mg/dL — ABNORMAL HIGH (ref <1.2)
Total Protein: 6.4 g/dL — ABNORMAL LOW (ref 6.5–8.1)

## 2023-03-14 LAB — DIFFERENTIAL
Abs Immature Granulocytes: 0.07 10*3/uL (ref 0.00–0.07)
Basophils Absolute: 0 10*3/uL (ref 0.0–0.1)
Basophils Relative: 0 %
Eosinophils Absolute: 0.1 10*3/uL (ref 0.0–0.5)
Eosinophils Relative: 0 %
Immature Granulocytes: 1 %
Lymphocytes Relative: 14 %
Lymphs Abs: 1.9 10*3/uL (ref 0.7–4.0)
Monocytes Absolute: 1.4 10*3/uL — ABNORMAL HIGH (ref 0.1–1.0)
Monocytes Relative: 10 %
Neutro Abs: 10.3 10*3/uL — ABNORMAL HIGH (ref 1.7–7.7)
Neutrophils Relative %: 75 %

## 2023-03-14 LAB — I-STAT CHEM 8, ED
BUN: 30 mg/dL — ABNORMAL HIGH (ref 8–23)
Calcium, Ion: 1.11 mmol/L — ABNORMAL LOW (ref 1.15–1.40)
Chloride: 101 mmol/L (ref 98–111)
Creatinine, Ser: 1.3 mg/dL — ABNORMAL HIGH (ref 0.61–1.24)
Glucose, Bld: 74 mg/dL (ref 70–99)
HCT: 33 % — ABNORMAL LOW (ref 39.0–52.0)
Hemoglobin: 11.2 g/dL — ABNORMAL LOW (ref 13.0–17.0)
Potassium: 4.3 mmol/L (ref 3.5–5.1)
Sodium: 137 mmol/L (ref 135–145)
TCO2: 26 mmol/L (ref 22–32)

## 2023-03-14 LAB — CBC
HCT: 34.4 % — ABNORMAL LOW (ref 39.0–52.0)
Hemoglobin: 11.1 g/dL — ABNORMAL LOW (ref 13.0–17.0)
MCH: 31.2 pg (ref 26.0–34.0)
MCHC: 32.3 g/dL (ref 30.0–36.0)
MCV: 96.6 fL (ref 80.0–100.0)
Platelets: 188 10*3/uL (ref 150–400)
RBC: 3.56 MIL/uL — ABNORMAL LOW (ref 4.22–5.81)
RDW: 12.8 % (ref 11.5–15.5)
WBC: 13.7 10*3/uL — ABNORMAL HIGH (ref 4.0–10.5)
nRBC: 0 % (ref 0.0–0.2)

## 2023-03-14 LAB — LIPID PANEL
Cholesterol: 61 mg/dL (ref 0–200)
HDL: 33 mg/dL — ABNORMAL LOW (ref >40)
LDL Cholesterol: 19 mg/dL (ref 0–99)
Total CHOL/HDL Ratio: 1.8 {ratio}
Triglycerides: 47 mg/dL (ref <150)
VLDL: 9 mg/dL (ref 0–40)

## 2023-03-14 LAB — GLUCOSE, CAPILLARY
Glucose-Capillary: 138 mg/dL — ABNORMAL HIGH (ref 70–99)
Glucose-Capillary: 189 mg/dL — ABNORMAL HIGH (ref 70–99)
Glucose-Capillary: 122 mg/dL — ABNORMAL HIGH (ref 70–99)
Glucose-Capillary: 140 mg/dL — ABNORMAL HIGH (ref 70–99)
Glucose-Capillary: 204 mg/dL — ABNORMAL HIGH (ref 70–99)

## 2023-03-14 LAB — RESP PANEL BY RT-PCR (RSV, FLU A&B, COVID)  RVPGX2
Influenza A by PCR: POSITIVE — AB
Influenza B by PCR: NEGATIVE
Resp Syncytial Virus by PCR: NEGATIVE
SARS Coronavirus 2 by RT PCR: NEGATIVE

## 2023-03-14 LAB — APTT: aPTT: 28 s (ref 24–36)

## 2023-03-14 LAB — CBG MONITORING, ED
Glucose-Capillary: 139 mg/dL — ABNORMAL HIGH (ref 70–99)
Glucose-Capillary: 68 mg/dL — ABNORMAL LOW (ref 70–99)

## 2023-03-14 LAB — ETHANOL: Alcohol, Ethyl (B): <10 mg/dL (ref <10)

## 2023-03-14 LAB — PROTIME-INR
INR: 1.2 (ref 0.8–1.2)
Prothrombin Time: 15.2 s (ref 11.4–15.2)

## 2023-03-14 LAB — MRSA NEXT GEN BY PCR, NASAL: MRSA by PCR Next Gen: NOT DETECTED

## 2023-03-14 LAB — I-STAT CG4 LACTIC ACID, ED: Lactic Acid, Venous: 1.7 mmol/L (ref 0.5–1.9)

## 2023-03-14 MED ORDER — IOHEXOL 350 MG/ML SOLN
75.0000 mL | Freq: Once | INTRAVENOUS | Status: AC | PRN
  Administered 2023-03-14: 75 mL via INTRAVENOUS

## 2023-03-14 MED ORDER — ONDANSETRON HCL 4 MG/2ML IJ SOLN: 4.0000 mg | Freq: Four times a day (QID) | INTRAMUSCULAR | Status: DC | PRN

## 2023-03-14 MED ORDER — ROSUVASTATIN CALCIUM 20 MG PO TABS
20.0000 mg | ORAL_TABLET | Freq: Every day | ORAL | Status: DC
  Administered 2023-03-15 – 2023-03-23 (×9): 20 mg via ORAL
  Filled 2023-03-14 (×9): qty 1

## 2023-03-14 MED ORDER — ACETAMINOPHEN 325 MG PO TABS
650.0000 mg | ORAL_TABLET | ORAL | Status: DC | PRN
  Administered 2023-03-14 – 2023-03-20 (×4): 650 mg via ORAL
  Filled 2023-03-14 (×5): qty 2

## 2023-03-14 MED ORDER — SODIUM CHLORIDE 0.9 % IV SOLN: INTRAVENOUS | Status: DC

## 2023-03-14 MED ORDER — CHLORHEXIDINE GLUCONATE CLOTH 2 % EX PADS
6.0000 | MEDICATED_PAD | Freq: Every day | CUTANEOUS | Status: DC
Start: 2023-03-14 — End: 2023-03-21
  Administered 2023-03-14 – 2023-03-16 (×3): 6 via TOPICAL

## 2023-03-14 MED ORDER — VITAMIN B-12 1000 MCG PO TABS
1000.0000 ug | ORAL_TABLET | Freq: Every day | ORAL | Status: DC
  Administered 2023-03-14 – 2023-03-23 (×10): 1000 ug via ORAL
  Filled 2023-03-14 (×10): qty 1

## 2023-03-14 MED ORDER — DEXTROSE 50 % IV SOLN
INTRAVENOUS | Status: AC
  Administered 2023-03-14: 25 mL via INTRAVENOUS
  Filled 2023-03-14: qty 50

## 2023-03-14 MED ORDER — OSELTAMIVIR PHOSPHATE 30 MG PO CAPS
30.0000 mg | ORAL_CAPSULE | Freq: Two times a day (BID) | ORAL | Status: DC
  Administered 2023-03-14 – 2023-03-15 (×3): 30 mg via ORAL
  Filled 2023-03-14 (×3): qty 1

## 2023-03-14 MED ORDER — STROKE: EARLY STAGES OF RECOVERY BOOK
Freq: Once | Status: AC
  Filled 2023-03-14: qty 1

## 2023-03-14 MED ORDER — LIDOCAINE-EPINEPHRINE-TETRACAINE (LET) TOPICAL GEL: 3.0000 mL | Freq: Two times a day (BID) | TOPICAL | Status: DC | PRN

## 2023-03-14 MED ORDER — PRAZOSIN HCL 1 MG PO CAPS
1.0000 mg | ORAL_CAPSULE | Freq: Every day | ORAL | Status: DC
  Administered 2023-03-14: 1 mg via ORAL
  Filled 2023-03-14 (×2): qty 1

## 2023-03-14 MED ORDER — LIDOCAINE-EPINEPHRINE-TETRACAINE (LET) TOPICAL GEL
3.0000 mL | Freq: Once | TOPICAL | Status: AC
  Administered 2023-03-14: 3 mL via TOPICAL
  Filled 2023-03-14: qty 3

## 2023-03-14 MED ORDER — TENECTEPLASE FOR STROKE
0.2500 mg/kg | PACK | Freq: Once | INTRAVENOUS | Status: AC
  Administered 2023-03-14: 20 mg via INTRAVENOUS
  Filled 2023-03-14: qty 10

## 2023-03-14 MED ORDER — LABETALOL HCL 5 MG/ML IV SOLN: 20.0000 mg | Freq: Once | INTRAVENOUS | Status: DC

## 2023-03-14 MED ORDER — SENNOSIDES-DOCUSATE SODIUM 8.6-50 MG PO TABS
1.0000 | ORAL_TABLET | Freq: Every evening | ORAL | Status: DC | PRN
  Administered 2023-03-18: 1 via ORAL
  Filled 2023-03-14: qty 1

## 2023-03-14 MED ORDER — DULOXETINE HCL 30 MG PO CPEP
30.0000 mg | ORAL_CAPSULE | Freq: Every day | ORAL | Status: DC
  Administered 2023-03-14 – 2023-03-23 (×10): 30 mg via ORAL
  Filled 2023-03-14 (×10): qty 1

## 2023-03-14 MED ORDER — SODIUM CHLORIDE 0.9% FLUSH
3.0000 mL | Freq: Once | INTRAVENOUS | Status: AC
  Administered 2023-03-14: 3 mL via INTRAVENOUS

## 2023-03-14 MED ORDER — GABAPENTIN 100 MG PO CAPS
100.0000 mg | ORAL_CAPSULE | Freq: Every day | ORAL | Status: DC
  Administered 2023-03-14 – 2023-03-22 (×9): 100 mg via ORAL
  Filled 2023-03-14 (×9): qty 1

## 2023-03-14 MED ORDER — ROPINIROLE HCL 1 MG PO TABS
4.0000 mg | ORAL_TABLET | Freq: Every day | ORAL | Status: DC
  Administered 2023-03-14 – 2023-03-22 (×9): 4 mg via ORAL
  Filled 2023-03-14 (×11): qty 4

## 2023-03-14 MED ORDER — CLEVIDIPINE BUTYRATE 0.5 MG/ML IV EMUL: 0.0000 mg/h | INTRAVENOUS | Status: DC

## 2023-03-14 MED ORDER — PANTOPRAZOLE SODIUM 40 MG IV SOLR: 40.0000 mg | Freq: Every day | INTRAVENOUS | Status: DC

## 2023-03-14 MED ORDER — PANTOPRAZOLE SODIUM 40 MG PO TBEC
40.0000 mg | DELAYED_RELEASE_TABLET | Freq: Every day | ORAL | Status: DC
  Administered 2023-03-14 – 2023-03-23 (×10): 40 mg via ORAL
  Filled 2023-03-14 (×10): qty 1

## 2023-03-14 MED ORDER — ACETAMINOPHEN 160 MG/5ML PO SOLN: 650.0000 mg | ORAL | Status: DC | PRN

## 2023-03-14 MED ORDER — ROSUVASTATIN CALCIUM 20 MG PO TABS
40.0000 mg | ORAL_TABLET | Freq: Every day | ORAL | Status: DC
  Administered 2023-03-14: 40 mg via ORAL
  Filled 2023-03-14: qty 2

## 2023-03-14 MED ORDER — FERROUS SULFATE 325 (65 FE) MG PO TABS
325.0000 mg | ORAL_TABLET | Freq: Every day | ORAL | Status: DC
  Administered 2023-03-15 – 2023-03-23 (×9): 325 mg via ORAL
  Filled 2023-03-14 (×9): qty 1

## 2023-03-14 MED ORDER — ORAL CARE MOUTH RINSE: 15.0000 mL | OROMUCOSAL | Status: DC | PRN

## 2023-03-14 MED ORDER — ACETAMINOPHEN 650 MG RE SUPP: 650.0000 mg | RECTAL | Status: DC | PRN

## 2023-03-14 MED ORDER — MIDODRINE HCL 5 MG PO TABS
5.0000 mg | ORAL_TABLET | Freq: Three times a day (TID) | ORAL | Status: DC
  Administered 2023-03-14 – 2023-03-23 (×27): 5 mg via ORAL
  Filled 2023-03-14 (×27): qty 1

## 2023-03-14 MED ORDER — INSULIN ASPART 100 UNIT/ML IJ SOLN
0.0000 [IU] | INTRAMUSCULAR | Status: DC
  Administered 2023-03-14: 5 [IU] via SUBCUTANEOUS
  Administered 2023-03-14 (×2): 2 [IU] via SUBCUTANEOUS
  Administered 2023-03-14: 3 [IU] via SUBCUTANEOUS
  Administered 2023-03-14: 2 [IU] via SUBCUTANEOUS
  Administered 2023-03-15: 3 [IU] via SUBCUTANEOUS
  Administered 2023-03-15 (×2): 2 [IU] via SUBCUTANEOUS

## 2023-03-14 MED ORDER — DEXTROSE 50 % IV SOLN: 25.0000 mL | Freq: Once | INTRAVENOUS | Status: AC

## 2023-03-14 NOTE — Progress Notes (Addendum)
STROKE TEAM PROGRESS NOTE   SUBJECTIVE (INTERVAL HISTORY) His RN is at the bedside.  Overall his condition is stable. Pt AAO x 3, no complains, still has RUE ataxia and mild RLE dysmetria. No significant weakness. Pending MRI. Had some scalp bleeding after TNK but now stopped.    OBJECTIVE Temp:  [97.8 F (36.6 C)-98.7 F (37.1 C)] 97.8 F (36.6 C) (12/29 0800) Pulse Rate:  [70-91] 75 (12/29 1030) Cardiac Rhythm: Normal sinus rhythm (12/29 0800) Resp:  [16-25] 16 (12/29 1030) BP: (90-126)/(48-66) 118/53 (12/29 1030) SpO2:  [92 %-98 %] 95 % (12/29 1030) Weight:  [67.4 kg-79.3 kg] 67.4 kg (12/29 0330)  Recent Labs  Lab 03/14/23 0005 03/14/23 0042 03/14/23 0352 03/14/23 0817  GLUCAP 68* 139* 138* 189*   Recent Labs  Lab 03/14/23 0010 03/14/23 0015  NA 136 137  K 3.8 4.3  CL 102 101  CO2 25  --   GLUCOSE 74 74  BUN 19 30*  CREATININE 1.34* 1.30*  CALCIUM 9.1  --    Recent Labs  Lab 03/14/23 0010  AST 35  ALT 20  ALKPHOS 64  BILITOT 1.4*  PROT 6.4*  ALBUMIN 3.3*   Recent Labs  Lab 03/14/23 0010 03/14/23 0015  WBC 13.7*  --   NEUTROABS 10.3*  --   HGB 11.1* 11.2*  HCT 34.4* 33.0*  MCV 96.6  --   PLT 188  --    No results for input(s): "CKTOTAL", "CKMB", "CKMBINDEX", "TROPONINI" in the last 168 hours. Recent Labs    03/14/23 0010  LABPROT 15.2  INR 1.2   No results for input(s): "COLORURINE", "LABSPEC", "PHURINE", "GLUCOSEU", "HGBUR", "BILIRUBINUR", "KETONESUR", "PROTEINUR", "UROBILINOGEN", "NITRITE", "LEUKOCYTESUR" in the last 72 hours.  Invalid input(s): "APPERANCEUR"     Component Value Date/Time   CHOL 61 03/14/2023 0405   CHOL 137 10/03/2020 1138   TRIG 47 03/14/2023 0405   HDL 33 (L) 03/14/2023 0405   HDL 33 (L) 10/03/2020 1138   CHOLHDL 1.8 03/14/2023 0405   VLDL 9 03/14/2023 0405   LDLCALC 19 03/14/2023 0405   LDLCALC 82 10/03/2020 1138   Lab Results  Component Value Date   HGBA1C 7.5 (H) 11/18/2022   No results found for:  "LABOPIA", "COCAINSCRNUR", "LABBENZ", "AMPHETMU", "THCU", "LABBARB"  Recent Labs  Lab 03/14/23 0010  ETH <10    I have personally reviewed the radiological images below and agree with the radiology interpretations.  CT CHEST ABDOMEN PELVIS W CONTRAST Result Date: 03/14/2023 CLINICAL DATA:  Chest trauma, blunt Abdominal trauma, blunt. Stroke, fall. EXAM: CT CHEST, ABDOMEN, AND PELVIS WITH CONTRAST TECHNIQUE: Multidetector CT imaging of the chest, abdomen and pelvis was performed following the standard protocol during bolus administration of intravenous contrast. RADIATION DOSE REDUCTION: This exam was performed according to the departmental dose-optimization program which includes automated exposure control, adjustment of the mA and/or kV according to patient size and/or use of iterative reconstruction technique. CONTRAST:  75mL OMNIPAQUE IOHEXOL 350 MG/ML SOLN COMPARISON:  CT angio chest 11/18/2022, CT angio chest 03/18/2021 FINDINGS: CHEST: Cardiovascular: No aortic injury. The thoracic aorta is normal in caliber. The heart is normal in size. No significant pericardial effusion. Moderate atherosclerotic plaque. At least 2 vessel coronary artery calcification. The main pulmonary artery is normal in caliber. No central or segmental pulmonary embolus. Limited evaluation more distally due to timing of contrast and motion artifact. Mediastinum/Nodes: No pneumomediastinum. No mediastinal hematoma. The esophagus is unremarkable. The thyroid is unremarkable. The central airways are patent. No mediastinal, hilar, or  axillary lymphadenopathy. Lungs/Pleura: Bilateral lower lobe bronchial wall thickening. Bilateral lower lobe mucous plugging, right greater than left. Bibasilar atelectasis. Left lower lobe ground-glass airspace opacity. Stable left lower lobe calcified pulmonary micronodule-no further follow-up indicated. No pulmonary mass. No pulmonary contusion or laceration. No pneumatocele formation. No pleural  effusion. No pneumothorax. No hemothorax. Musculoskeletal/Chest wall: No chest wall mass. No acute rib or sternal fracture. No spinal fracture. ABDOMEN / PELVIS: Hepatobiliary: Not enlarged. No focal lesion. No laceration or subcapsular hematoma. Status post cholecystectomy.  No biliary ductal dilatation. Pancreas: Normal pancreatic contour. No main pancreatic duct dilatation. Spleen: Not enlarged. No focal lesion. No laceration, subcapsular hematoma, or vascular injury. Adrenals/Urinary Tract: No nodularity bilaterally. Bilateral kidneys enhance and excrete symmetrically. No hydronephrosis. No contusion, laceration, or subcapsular hematoma. No injury to the vascular structures or collecting systems. No hydroureter. The urinary bladder is unremarkable. Stomach/Bowel: No small or large bowel wall thickening or dilatation. Stool throughout the colon. The appendix is unremarkable. Vasculature/Lymphatics: Severe atherosclerotic plaque no abdominal aorta or iliac aneurysm. No active contrast extravasation or pseudoaneurysm. No abdominal, pelvic, inguinal lymphadenopathy. Reproductive: Radiation seeds along the prostate. Other: No simple free fluid ascites. No pneumoperitoneum. No hemoperitoneum. No mesenteric hematoma identified. No organized fluid collection. Musculoskeletal: No significant soft tissue hematoma. No acute pelvic fracture. No spinal fracture. L2-S2 posterolateral interbody surgical hardware fusion. Ports and Devices: None. IMPRESSION: 1. No acute intrathoracic, intra-abdominal, intrapelvic traumatic injury. 2. No acute fracture or traumatic malalignment of the thoracic or lumbar spine. 3. Other imaging findings of potential clinical significance: Bilateral lower lobe bronchial wall thickening with bilateral mucous plugging and patchy ground-glass airspace opacity left lower lobe-question developing infection/inflammation. Stool throughout the colon-correlate for constipation. Aortic Atherosclerosis  (ICD10-I70.0)-severe. Electronically Signed   By: Tish Frederickson M.D.   On: 03/14/2023 03:34   DG Chest Port 1 View Result Date: 03/14/2023 CLINICAL DATA:  Recent fall EXAM: PORTABLE CHEST 1 VIEW COMPARISON:  11/01/2022 FINDINGS: Cardiac shadows within normal limits. Aortic calcifications are noted. Skin fold is noted over the right chest. Minimal left basilar atelectasis is seen. No focal confluent infiltrate is noted. No bony abnormality is noted. IMPRESSION: Mild left basilar atelectasis. Electronically Signed   By: Alcide Clever M.D.   On: 03/14/2023 00:58   CT ANGIO HEAD NECK W WO CM (CODE STROKE) Result Date: 03/14/2023 CLINICAL DATA:  Right arm weakness, right pupil blown, head trauma EXAM: CT ANGIOGRAPHY HEAD AND NECK WITH AND WITHOUT CONTRAST TECHNIQUE: Multidetector CT imaging of the head and neck was performed using the standard protocol during bolus administration of intravenous contrast. Multiplanar CT image reconstructions and MIPs were obtained to evaluate the vascular anatomy. Carotid stenosis measurements (when applicable) are obtained utilizing NASCET criteria, using the distal internal carotid diameter as the denominator. RADIATION DOSE REDUCTION: This exam was performed according to the departmental dose-optimization program which includes automated exposure control, adjustment of the mA and/or kV according to patient size and/or use of iterative reconstruction technique. CONTRAST:  75mL OMNIPAQUE IOHEXOL 350 MG/ML SOLN COMPARISON:  09/13/2022 CTA head and neck, correlation is made with 03/14/2023 CT head FINDINGS: CT HEAD FINDINGS For noncontrast findings, please see same day CT head. CTA NECK FINDINGS Aortic arch: Standard branching. Imaged portion shows no evidence of aneurysm or dissection. No significant stenosis of the major arch vessel origins. Aortic atherosclerosis Right carotid system: No evidence of dissection, occlusion, or hemodynamically significant stenosis (greater than  50%). Left carotid system: No evidence of dissection, occlusion, or hemodynamically significant stenosis (greater than  50%). Vertebral arteries: Moderate stenosis at the origin of the left vertebral artery. Evaluation of the proximal left vertebral artery is limited by beam hardening artifact from the adjacent contrast bolus. The left vertebral artery is otherwise patent to the skull base. Mild stenosis at the origin of the right vertebral artery. The right vertebral artery is otherwise patent to the skull base. No evidence of dissection. Skeleton: No acute osseous abnormality. Degenerative changes in the cervical spine. Other neck: No acute finding. Upper chest: No focal pulmonary opacity or pleural effusion. Review of the MIP images confirms the above findings CTA HEAD FINDINGS Anterior circulation: Both internal carotid arteries are patent to the termini, with mild stenosis in the bilateral cavernous segments and moderate to severe stenosis in the proximal supraclinoid segments bilaterally. A1 segments patent. Normal anterior communicating artery. Anterior cerebral arteries are patent to their distal aspects without significant stenosis. No M1 stenosis or occlusion. MCA branches are irregular but perfused to their distal aspects without significant stenosis. Posterior circulation: Vertebral arteries patent to the vertebrobasilar junction, with mild stenosis in the proximal right V4. The left vertebral artery primarily supplies the left PICA, and is quite diminutive after the PICA takeoff. Posterior inferior cerebellar arteries patent proximally. Basilar patent to its distal aspect without significant stenosis. Superior cerebellar arteries patent proximally. 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 Redemonstrated severe stenosis in the proximal left P2 (series 7, images 98-99), with  multifocal irregularity in the remainder of the left P2. Evaluation of the more distal PCAs is limited by venous contamination. The bilateral posterior communicating arteries are not definitively seen. Venous sinuses: As permitted by contrast timing, patent. Anatomic variants: None significant. No evidence of aneurysm or vascular malformation. Review of the MIP images confirms the above findings IMPRESSION: 1. No intracranial large vessel occlusion. 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 2. 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 3. 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 4. Moderate stenosis at the origin of the left vertebral artery and mild stenosis at the origin of the right vertebral artery. 5. Aortic atherosclerosis. Aortic Atherosclerosis (ICD10-I70.0). Electronically Signed   By: Wiliam Ke M.D.   On: 03/14/2023 00:46   CT Cervical Spine Wo Contrast Result Date: 03/14/2023 CLINICAL DATA:  Neck trauma, fell backwards EXAM: CT CERVICAL SPINE WITHOUT CONTRAST TECHNIQUE: Multidetector CT imaging of the cervical spine was performed without intravenous contrast. Multiplanar CT image reconstructions were also generated. RADIATION DOSE REDUCTION: This exam was performed according to the departmental dose-optimization program which includes automated exposure control, adjustment of the mA and/or kV according to patient size and/or use of iterative reconstruction technique. COMPARISON:  02/21/2023 CT cervical spine FINDINGS: Alignment: No traumatic listhesis. Straightening of the normal cervical lordosis. Trace stepwise anterolisthesis of C3 on C4 and C4 on C5. Skull base and vertebrae: No acute fracture. No primary bone lesion or focal pathologic process. Soft tissues and spinal  canal: No prevertebral fluid or swelling. No visible canal hematoma. Disc levels: Degenerative changes in the cervical spine. No high-grade spinal canal stenosis. Upper chest: Negative. IMPRESSION: No acute fracture or traumatic listhesis in the cervical spine. Electronically Signed   By: Elaina Pattee.D.  On: 03/14/2023 00:34   CT HEAD CODE STROKE WO CONTRAST Result Date: 03/14/2023 CLINICAL DATA:  Code stroke. Right arm weakness, right pupil blown, trauma to posterior head after fall EXAM: CT HEAD WITHOUT CONTRAST TECHNIQUE: Contiguous axial images were obtained from the base of the skull through the vertex without intravenous contrast. RADIATION DOSE REDUCTION: This exam was performed according to the departmental dose-optimization program which includes automated exposure control, adjustment of the mA and/or kV according to patient size and/or use of iterative reconstruction technique. COMPARISON:  02/21/2023 CT head FINDINGS: Brain: No evidence of acute infarction, hemorrhage, mass, mass effect, or midline shift. No hydrocephalus or extra-axial collection. Basal ganglia calcifications. Normal cerebral volume. Vascular: No hyperdense vessel. Atherosclerotic calcifications in the intracranial carotid and vertebral arteries. Skull: Negative for fracture or focal lesion. Left occipital scalp hematoma. Sinuses/Orbits: No acute finding. Other: The mastoid air cells are well aerated. ASPECTS [facility] Stroke Program Early CT Score) - Ganglionic level infarction (caudate, lentiform nuclei, internal capsule, insula, M1-M3 cortex): 7 - Supraganglionic infarction (M4-M6 cortex): 3 Total score (0-10 with 10 being normal): 10 IMPRESSION: 1. No acute intracranial process. ASPECTS is 10. 2. Left occipital scalp hematoma. Imaging results were communicated on 03/14/2023 at 12:21 am to provider Dr. Wilford Corner via secure text paging. Electronically Signed   By: Wiliam Ke M.D.   On: 03/14/2023 00:21   CT Cervical Spine Wo  Contrast Result Date: 02/21/2023 CLINICAL DATA:  82 year old male status post fall yesterday, legs gave out. Abrasions. Blurred vision. EXAM: CT CERVICAL SPINE WITHOUT CONTRAST TECHNIQUE: Multidetector CT imaging of the cervical spine was performed without intravenous contrast. Multiplanar CT image reconstructions were also generated. RADIATION DOSE REDUCTION: This exam was performed according to the departmental dose-optimization program which includes automated exposure control, adjustment of the mA and/or kV according to patient size and/or use of iterative reconstruction technique. COMPARISON:  CT head and face today.  Cervical spine CT 04/06/2022. FINDINGS: Alignment: Chronic straightening but mildly increased reversal of cervical lordosis now. Cervicothoracic junction alignment is within normal limits. Mild chronic degenerative appearing anterolisthesis at both C3-C4 and C4-C5 with chronic facet degeneration. Bilateral posterior element alignment is within normal limits. Skull base and vertebrae: Bone mineralization is within normal limits. Visualized skull base is intact. No atlanto-occipital dissociation. C1 and C2 appear intact and aligned. No acute osseous abnormality identified. Soft tissues and spinal canal: No prevertebral fluid or swelling. No visible canal hematoma. Negative visible noncontrast neck soft tissues aside from carotid atherosclerosis. 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 Upper chest: Negative. IMPRESSION: 1. No acute traumatic injury identified in the cervical spine. 2. 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 Electronically Signed   By: Odessa Fleming M.D.   On: 02/21/2023 09:15   CT Maxillofacial Wo Contrast Result Date: 02/21/2023 CLINICAL DATA:  82 year old male status post fall yesterday, legs gave  out. Abrasions. Blurred vision. EXAM: CT MAXILLOFACIAL WITHOUT CONTRAST TECHNIQUE: Multidetector CT imaging of the maxillofacial structures was performed. Multiplanar CT image reconstructions were also generated. RADIATION DOSE REDUCTION: This exam was performed according to the departmental dose-optimization program which includes automated exposure control, adjustment of the mA and/or kV according to patient size and/or use of iterative reconstruction technique. COMPARISON:  CT head and cervical spine today. Prior face CT 11/02/2022. FINDINGS: Osseous: Mandible intact and normally located. Stable dentition. Bilateral maxilla, zygoma, pterygoid and nasal bones appear stable and intact. Central skull  base appears intact. Orbits: No acute orbital wall fracture. Chronic postoperative changes to the globes. No acute orbit soft tissue finding. Sinuses: Visualized paranasal sinuses and mastoids are stable and well aerated. Soft tissues: Negative visible noncontrast larynx, pharynx, parapharyngeal spaces, retropharyngeal space, sublingual space. Partial submandibular gland atrophy is stable. Masticator and parotid spaces appear negative. Calcified atherosclerosis. No soft tissue gas or superficial soft tissue injury identified other than the right forehead reported separately. Limited intracranial: Stable to that reported separately. IMPRESSION: No acute traumatic injury identified in the Face. Electronically Signed   By: Odessa Fleming M.D.   On: 02/21/2023 09:13   CT Head Wo Contrast Result Date: 02/21/2023 CLINICAL DATA:  82 year old male status post fall yesterday, legs gave out. Abrasions. Blurred vision. EXAM: CT HEAD WITHOUT CONTRAST TECHNIQUE: Contiguous axial images were obtained from the base of the skull through the vertex without intravenous contrast. RADIATION DOSE REDUCTION: This exam was performed according to the departmental dose-optimization program which includes automated exposure control, adjustment of  the mA and/or kV according to patient size and/or use of iterative reconstruction technique. COMPARISON:  Brain MRI 01/18/2023 and earlier. Face and cervical spine CT today reported separately. FINDINGS: Brain: Stable cerebral volume. Stable basal ganglia vascular calcifications. No midline shift, mass effect, No midline shift, ventriculomegaly, mass effect, evidence of mass lesion, intracranial hemorrhage or evidence of cortically based acute infarction. Stable gray-white matter differentiation throughout the brain. Mild for age white matter hypodensity. Vascular: Calcified atherosclerosis at the skull base. No suspicious intracranial vascular hyperdensity. Skull: Stable.  No fracture identified. Sinuses/Orbits: Visualized paranasal sinuses and mastoids are stable and well aerated. Other: Mild right forehead scalp hematoma or contusion series 3, image 18. Underlying right frontal bone intact. No scalp soft tissue gas. IMPRESSION: 1. Mild right forehead scalp hematoma or contusion. No skull fracture. 2. No acute intracranial abnormality. Stable mild for age chronic cerebral white matter changes. Electronically Signed   By: Odessa Fleming M.D.   On: 02/21/2023 09:09     PHYSICAL EXAM  Temp:  [97.8 F (36.6 C)-98.7 F (37.1 C)] 97.8 F (36.6 C) (12/29 0800) Pulse Rate:  [70-91] 75 (12/29 1030) Resp:  [16-25] 16 (12/29 1030) BP: (90-126)/(48-66) 118/53 (12/29 1030) SpO2:  [92 %-98 %] 95 % (12/29 1030) Weight:  [67.4 kg-79.3 kg] 67.4 kg (12/29 0330)  General - Well nourished, well developed, in no apparent distress.  Ophthalmologic - fundi not visualized due to noncooperation.  Cardiovascular - Regular rhythm and rate.  Mental Status -  Level of arousal and orientation to time, place, and person were intact. Language including expression, naming, repetition, comprehension was assessed and found intact. Fund of Knowledge was assessed and was intact.  Cranial Nerves II - XII - II - Visual field intact  OU. III, IV, VI - Extraocular movements intact. V - Facial sensation intact bilaterally. VII - Facial movement intact bilaterally. VIII - Hearing & vestibular intact bilaterally. X - Palate elevates symmetrically. XI - Chin turning & shoulder shrug intact bilaterally. XII - Tongue protrusion intact.  Motor Strength - The patients strength was normal in all extremities and pronator drift was absent.  Bulk was normal and fasciculations were absent.   Motor Tone - Muscle tone was assessed at the neck and appendages and was normal.  Reflexes - The patients reflexes were symmetrical in all extremities and he had no pathological reflexes.  Sensory - Light touch, temperature/pinprick were assessed and were symmetrical.    Coordination - right FTN ataxic and  HTS mild dysmetria. Left FTN and HTS grossly intact.  Tremor was absent.  Gait and Station - deferred.   ASSESSMENT/PLAN Harry Zamora is a 82 y.o. male with history of HTN, HLD, DM, prostate Ca, LBP admitted for fell at home, right sided weakness ataxia. TNK was given.    Stroke:  ? Right cerebellar infarct secondary to small vessel disease source vs. Severe orthostatic hypotension CT no acute finding CTA head and neck b/l P1/P2 severe stenosis. 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 Left VA origin moderate stenosis MRI  pending 2D Echo  pending LDL 19 HgbA1c pending SCDs for VTE prophylaxis clopidogrel 75 mg daily prior to admission, now on No antithrombotic within 24h of TNK Ongoing aggressive stroke risk factor management Therapy recommendations:  pending Disposition:  pending  Diabetes HgbA1c pending goal < 7.0 Controlled CBG monitoring SSI DM education and close PCP follow up  Hx of hypertension Orthostatic hypotension Stable on the low end Orthostatic vitals - supine 122/59, sitting 85/49. standing 66/49 but asymptomaticd Encourage po intake Will put on TED hose Will order midodrine 5mg  tid from 8am to  5pm Continue IVF @ 40, total 1L BP goal < 180/105 post TNK Long term BP goal normotensive  Hyperlipidemia Home meds:  crestor 40  LDL 19, goal < 70 Now on crestor 20 Continue statin at discharge  Other Stroke Risk Factors Advanced age  Other Active Problems LBP Prostate cancer  Hospital day # 0  This patient is critically ill due to stroke s/p TNK and at significant risk of neurological worsening, death form recurrent stroke, hemorrhagic conversion, bleeding from TNK. This patient's care requires constant monitoring of vital signs, hemodynamics, respiratory and cardiac monitoring, review of multiple databases, neurological assessment, discussion with family, other specialists and medical decision making of high complexity. I spent 30 minutes of neurocritical care time in the care of this patient.   Marvel Plan, MD PhD Stroke Neurology 03/14/2023 11:14 AM    To contact Stroke Continuity provider, please refer to [email or web address]. After hours, contact General Neurology

## 2023-03-14 NOTE — Evaluation (Signed)
Speech Language Pathology Evaluation Patient Details Name: Harry Zamora MRN: [id number] DOB: [birth year] Today's Date: 03/14/2023 Time: 7253-6644 SLP Time Calculation (min) (ACUTE ONLY): 15 min  Problem List:  Patient Active Problem List   Diagnosis Date Noted   Acute ischemic stroke (HCC) 03/14/2023   Orthostatic hypotension 11/18/2022   Gait abnormality 11/18/2022   Peripheral neuropathy 11/18/2022   Confusion 11/18/2022   Unilateral primary osteoarthritis, left knee 10/01/2021   Postural dizziness with presyncope 08/04/2021   Lumbar spinal stenosis 04/24/2021   Sepsis (HCC) 03/13/2021   AKI (acute kidney injury) (HCC) 03/13/2021   Elevated CK 03/13/2021   Severe sepsis (HCC) 03/13/2021   PNA (pneumonia) 03/12/2021   Restless legs    Acute respiratory failure with hypoxia (HCC)    Coronary artery disease involving native coronary artery of native heart without angina pectoris 09/19/2020   Abnormal stress test 09/16/2020   Hyperkalemia 09/12/2020   Acute renal failure 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) 09/12/2020   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   Type 2 diabetes mellitus (HCC)    Syncope and collapse 08/07/2020   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   PAD (peripheral artery disease) (HCC) 08/30/2019   Spondylolisthesis of lumbar region 07/13/2019   Spinal stenosis of lumbar region with neurogenic claudication 10/02/2016   Diabetic polyneuropathy (HCC) 11/10/2013   Past Medical History:  Past Medical History:  Diagnosis Date   Anemia    as an infant   Arthritis    Back pain    Depression    Disc displacement, lumbar    Gynecomastia    History of kidney stones    Hyperlipidemia    Hypertension    no longer on medications   Hypertriglyceridemia    Insomnia    Low back pain    Lumbar radiculopathy    Lumbar stenosis    OA (osteoarthritis)    Polyneuropathy in diabetes(357.2)    Prostate cancer (HCC) 2003   Restless  legs    Ringing in ears    RLS (restless legs syndrome)    Type 2 diabetes mellitus (HCC)    Past Surgical History:  Past Surgical History:  Procedure Laterality Date   ABDOMINAL EXPOSURE N/A 07/13/2019   Procedure: ABDOMINAL EXPOSURE;  Surgeon: Nada Libman, MD;  Location: [facility] OR;  Service: Vascular;  Laterality: N/A;  anterior approach   ANTERIOR LAT LUMBAR FUSION Right 07/13/2019   Procedure: Right Lumbar 3-4 Lumbar 4-5 Anterolateral lumbar interbody fusion;  Surgeon: Maeola Harman, MD;  Location[facility] OR;  Service: Neurosurgery;  Laterality: Right;   ANTERIOR LUMBAR FUSION N/A 07/13/2019   Procedure: Lumbar Five-Sacral One Anterior lumbar interbody fusion;  Surgeon: Maeola Harman, MD;  Location: [facility] OR;  Service: Neurosurgery;  Laterality: N/A;  Anterior approach   APPLICATION OF INTRAOPERATIVE CT SCAN N/A 04/24/2021   Procedure: APPLICATION OF INTRAOPERATIVE CT SCAN;  Surgeon: Dawley, Alan Mulder, DO;  Location: [facility] OR;  Service: Neurosurgery;  Laterality: N/A;  3C/RM 21   CHOLECYSTECTOMY     COLONOSCOPY     CORONARY STENT INTERVENTION N/A 10/01/2020   Procedure: CORONARY STENT INTERVENTION;  Surgeon: Elder Negus, MD;  Location: [facility] INVASIVE CV LAB;  Service: Cardiovascular;  Laterality: N/A;   EYE SURGERY Bilateral    GALLBLADDER SURGERY     HEMORRHOID SURGERY  1983   LEFT HEART CATH AND CORONARY ANGIOGRAPHY N/A 09/17/2020   Procedure: LEFT HEART CATH AND CORONARY ANGIOGRAPHY;  Surgeon: Elder Negus, MD;  Location: [facility]  INVASIVE CV LAB;  Service: Cardiovascular;  Laterality: N/A;   LUMBAR LAMINECTOMY/DECOMPRESSION MICRODISCECTOMY N/A 10/02/2016   Procedure: L3 to S1 Laminectomy;  Surgeon: Maeola Harman, MD;  Location: [facility] OR;  Service: Neurosurgery;  Laterality: N/A;  L3 to S1 Laminectomy   LUMBAR LAMINECTOMY/DECOMPRESSION MICRODISCECTOMY Bilateral 09/07/2019   Procedure: Bilateral Lumbar Five - Sacral One Foraminotomy;  Surgeon: Maeola Harman, MD;  Location: [facility] OR;  Service:  Neurosurgery;  Laterality: Bilateral;  posterior   LUMBAR PERCUTANEOUS PEDICLE SCREW 3 LEVEL N/A 07/13/2019   Procedure: Percutaneous pedicle screw fixation from Lumbar 3 to Sacral 1;  Surgeon: Maeola Harman, MD;  Location: [facility] OR;  Service: Neurosurgery;  Laterality: N/A;   MENISCUS REPAIR Right    PROSTATE SURGERY     SHOULDER SURGERY Left 01/2011   rotator cuff   HPI:  Patient is an 82 y.o. male with PMH: HTN, HLD, prostate cancer, depression, DM-2, anemia, OA. He presented to the hospital on 03/14/23 after a fall and with right sided weakness. He received TNK. CT head negative, MRI pending. Neurology MD reports RUE ataxia and mild RLE dysmetria but no significant weakness.   Assessment / Plan / Recommendation Clinical Impression  Patient is not currently presenting with any deficits in areas of cognition, language, speech or voice. Oral motor evaluation did not reveal any focal weakness or assymetry of oral motor musculature. Patient did report he has upper and lower partials which are at home but also that he does not wear them frequently. When asked about any symptoms or concerns regarding his speech, cognition, language, patient did state that his wife had told him his speech was a little hard for her to understand since this admission. He himself does not feel his speech has changed. SLP judged patient's speech intelligiblity to be 100% at conversational level when context partially known. He was fully oriented and did not exhibit any cognitive impairment. No further skilled SLP services warranted at this time.    SLP Assessment  SLP Recommendation/Assessment: Patient does not need any further Speech Lanaguage Pathology Services SLP Visit Diagnosis: Dysarthria and anarthria (R47.1);Cognitive communication deficit (R41.841)    Recommendations for follow up therapy are one component of a multi-disciplinary discharge planning process, led by the attending physician.  Recommendations may be  updated based on patient status, additional functional criteria and insurance authorization.    Follow Up Recommendations  No SLP follow up    Assistance Recommended at Discharge  None  Functional Status Assessment Patient has not had a recent decline in their functional status  Frequency and Duration   N/A        SLP Evaluation Cognition  Overall Cognitive Status: Within Functional Limits for tasks assessed Arousal/Alertness: Awake/alert Orientation Level: Oriented X4       Comprehension  Auditory Comprehension Overall Auditory Comprehension: Appears within functional limits for tasks assessed    Expression Expression Primary Mode of Expression: Verbal Verbal Expression Overall Verbal Expression: Appears within functional limits for tasks assessed   Oral / Motor  Oral Motor/Sensory Function Overall Oral Motor/Sensory Function: Within functional limits Motor Speech Overall Motor Speech: Appears within functional limits for tasks assessed Respiration: Within functional limits Resonance: Within functional limits Articulation: Within functional limitis Intelligibility: Intelligible Motor Planning: Witnin functional limits            Angela Nevin, MA, CCC-SLP Speech Therapy

## 2023-03-14 NOTE — ED Notes (Signed)
Per Dr. Madilyn Hook and Dr. Wilford Corner, pt to be scanned for CT chest, abd, and pelvis with contrast prior to going to admitted bed.

## 2023-03-14 NOTE — Plan of Care (Signed)
°  Problem: Ischemic Stroke/TIA Tissue Perfusion: Goal: Complications of ischemic stroke/TIA will be minimized Outcome: Progressing   Problem: Coping: Goal: Will verbalize positive feelings about self Outcome: Progressing   Problem: Health Behavior/Discharge Planning: Goal: Goals will be collaboratively established with patient/family Outcome: Progressing   Problem: Self-Care: Goal: Ability to communicate needs accurately will improve Outcome: Progressing   Problem: Nutrition: Goal: Risk of aspiration will decrease Outcome: Progressing Goal: Dietary intake will improve Outcome: Progressing   Problem: Clinical Measurements: Goal: Ability to maintain clinical measurements within normal limits will improve Outcome: Progressing Goal: Respiratory complications will improve Outcome: Progressing Goal: Cardiovascular complication will be avoided Outcome: Progressing   Problem: Coping: Goal: Level of anxiety will decrease Outcome: Progressing   Problem: Pain Management: Goal: General experience of comfort will improve Outcome: Progressing

## 2023-03-14 NOTE — ED Notes (Signed)
Patient transported to CT via stretcher by this RN. Pt remained on cardiac monitor. No acute distress noted.

## 2023-03-14 NOTE — Progress Notes (Signed)
Flu-A positive. Start Oseltamivir Droplet precautions.   -- Milon Dikes, MD Neurologist [facility]

## 2023-03-14 NOTE — Evaluation (Signed)
Physical Therapy Evaluation Patient Details Name: Harry Zamora MRN: [id number] DOB: December 19, 1940 Today's Date: 03/14/2023  History of Present Illness  82 y.o. male presents to [facility] hospital on 03/14/2023 after a fall and with R weakness. Pt received TNK. CT negative, MRI pending. PMH includes OA, depression, HTN, HLD, prostate cancer, DMII.  Clinical Impression  Pt presents to PT with deficits in functional mobility, balance, endurance, power. Pt is orthostatic during session, although denying symptoms. With seated exercise pt is able to bring BP back up toward baseline and then maintain this pressure after ambulation. Pt does demonstrates increased lateral sway when ambulating. PT encourages the pt to utilize his rollator for all ambulation at this time due to her frequent falls. Pt may benefit from trial with compression stockings. PT also recommends outpatient PT at the time of discharge.        If plan is discharge home, recommend the following: A little help with walking and/or transfers;A little help with bathing/dressing/bathroom;Assistance with cooking/housework;Assist for transportation;Help with stairs or ramp for entrance   Can travel by private vehicle        Equipment Recommendations BSC/3in1  Recommendations for Other Services       Functional Status Assessment Patient has had a recent decline in their functional status and demonstrates the ability to make significant improvements in function in a reasonable and predictable amount of time.     Precautions / Restrictions Precautions Precautions: Fall Precaution Comments: history of many falls Restrictions Weight Bearing Restrictions Per Provider Order: No      Mobility  Bed Mobility Overal bed mobility: Needs Assistance Bed Mobility: Supine to Sit     Supine to sit: Min assist          Transfers Overall transfer level: Needs assistance Equipment used:  (non) Transfers: Sit to/from Stand Sit to Stand:  Contact guard assist                Ambulation/Gait Ambulation/Gait assistance: Contact guard assist Gait Distance (Feet): 100 Feet Assistive device: IV Pole Gait Pattern/deviations: Step-through pattern, Wide base of support Gait velocity: reduced Gait velocity interpretation: <1.8 ft/sec, indicate of risk for recurrent falls   General Gait Details: pt with slowed step-through gait, widened BOS, increased lateral sway  Stairs            Wheelchair Mobility     Tilt Bed    Modified Rankin (Stroke Patients Only) Modified Rankin (Stroke Patients Only) Pre-Morbid Rankin Score: Moderate disability Modified Rankin: Moderately severe disability     Balance Overall balance assessment: Needs assistance Sitting-balance support: No upper extremity supported, Feet supported Sitting balance-Leahy Scale: Good     Standing balance support: Single extremity supported, Reliant on assistive device for balance Standing balance-Leahy Scale: Poor                               Pertinent Vitals/Pain Pain Assessment Pain Assessment: No/denies pain    Home Living Family/patient expects to be discharged to:: Private residence Living Arrangements: Spouse/significant other Available Help at Discharge: Family;Available PRN/intermittently Type of Home: House Home Access: Stairs to enter Entrance Stairs-Rails: None Entrance Stairs-Number of Steps: threshhold   Home Layout: One level Home Equipment: Cane - single point;Rollator (4 wheels);Shower seat;Grab bars - tub/shower      Prior Function Prior Level of Function : Independent/Modified Independent;History of Falls (last six months)  Mobility Comments: ambulatory with SPC vs rollator       Extremity/Trunk Assessment   Upper Extremity Assessment Upper Extremity Assessment: Overall WFL for tasks assessed    Lower Extremity Assessment Lower Extremity Assessment: Overall WFL for tasks  assessed    Cervical / Trunk Assessment Cervical / Trunk Assessment: Other exceptions Cervical / Trunk Exceptions: abrasion on posterior skull covered by dressing  Communication   Communication Communication: No apparent difficulties Cueing Techniques: Verbal cues  Cognition Arousal: Alert Behavior During Therapy: WFL for tasks assessed/performed Overall Cognitive Status: Within Functional Limits for tasks assessed                                          General Comments General comments (skin integrity, edema, etc.): orthostatic vitals documented in flowsheet, pt is orthostatic but asymptomatic    Exercises     Assessment/Plan    PT Assessment Patient needs continued PT services  PT Problem List Decreased activity tolerance;Decreased balance;Decreased strength;Decreased mobility;Decreased knowledge of use of DME;Decreased safety awareness;Decreased knowledge of precautions;Cardiopulmonary status limiting activity       PT Treatment Interventions DME instruction;Gait training;Stair training;Functional mobility training;Therapeutic activities;Therapeutic exercise;Balance training;Neuromuscular re-education;Patient/family education    PT Goals (Current goals can be found in the Care Plan section)  Acute Rehab PT Goals Patient Stated Goal: to stop falling PT Goal Formulation: With patient Time For Goal Achievement: 03/28/23 Potential to Achieve Goals: Fair Additional Goals Additional Goal #1: Pt will score >19/24 on the DGI to indicate a reduced risk for falls    Frequency Min 1X/week     Co-evaluation               AM-PAC PT "6 Clicks" Mobility  Outcome Measure Help needed turning from your back to your side while in a flat bed without using bedrails?: A Little Help needed moving from lying on your back to sitting on the side of a flat bed without using bedrails?: A Little Help needed moving to and from a bed to a chair (including a  wheelchair)?: A Little Help needed standing up from a chair using your arms (e.g., wheelchair or bedside chair)?: A Little Help needed to walk in hospital room?: A Little Help needed climbing 3-5 steps with a railing? : A Little 6 Click Score: 18    End of Session Equipment Utilized During Treatment: Gait belt Activity Tolerance: Patient tolerated treatment well Patient left: in chair;with call bell/phone within reach;with chair alarm set Nurse Communication: Mobility status PT Visit Diagnosis: Other abnormalities of gait and mobility (R26.89);Muscle weakness (generalized) (M62.81);History of falling (Z91.81)    Time: 0865-7846 PT Time Calculation (min) (ACUTE ONLY): 25 min   Charges:   PT Evaluation $PT Eval Low Complexity: 1 Low   PT General Charges $$ ACUTE PT VISIT: 1 Visit         Arlyss Gandy, PT, DPT Acute Rehabilitation Office [phone]   Arlyss Gandy 03/14/2023, 2:03 PM

## 2023-03-14 NOTE — Progress Notes (Signed)
PT Cancellation Note  Patient Details Name: JEKAI BARBERI MRN: [id number] DOB: 13-Jan-1941   Cancelled Treatment:    Reason Eval/Treat Not Completed: Active bedrest order. PT on bedrest after receiving TNK. PT will follow up once the pt is off bedrest and is medically appropriate to mobilize.   Arlyss Gandy 03/14/2023, 8:23 AM

## 2023-03-14 NOTE — ED Triage Notes (Addendum)
Pt BIB EMS from home with initial c/o falling backwards striking the back of his head. EMS noticed right arm weakness and right pupil dilation not equal to left. Cbg 107 with ems, cbg 68 here. 1/2 D50 gvien at 0010. Pt A&O x 4. Pt placed on 2lpm [place], O2 was 90% on RA. Pt vomited x 4, 4mg  zofran given. LKW 2300 after going to the bathroom, fall was around 2330

## 2023-03-14 NOTE — Progress Notes (Signed)
1700:  Patient stated right arm "feels like it is sleepy."  Other than this, rest of neuro exam is similar to prior assessments.  Stroke MD notified and STAT CT completed.

## 2023-03-14 NOTE — ED Provider Notes (Signed)
[place] EMERGENCY DEPARTMENT AT [facility] Provider Note   CSN: [id number] Arrival date & time: 03/14/23  0003  An emergency department physician performed an initial assessment on this suspected stroke patient at 0006.  History  Chief Complaint  Patient presents with   Code Stroke    Harry Zamora is a 82 y.o. male.  The history is provided by the patient, the EMS personnel and medical records.  Harry Zamora is a 82 y.o. male who presents to the Emergency Department complaining of code stroke.  He presents to the emergency department by EMS as a code stroke.  He has been sick with asper respiratory/flulike symptoms for the last several days.  He was last known well around 8 PM.  Around 11 PM he was walking and fell backwards, striking his head around 1115.  Wife noted that he was weak in his right arm and his pupil was different on the right.  EMS reports emesis x 3 with transient hypotension down to the 50s associated with the emesis concern for vagal episode.  He did receive Zofran prior to ED arrival.  He does not take any anticoagulants. He denies any headache, chest pain, abdominal pain.      Home Medications Prior to Admission medications   Medication Sig Start Date End Date Taking? Authorizing Provider  ascorbic acid (VITAMIN C) 500 MG tablet Take 500 mg by mouth daily.   Yes [provider]  Calcium Carb-Cholecalciferol (CALCIUM 1000 + D PO) Take by mouth.   Yes [provider]  clopidogrel (PLAVIX) 75 MG tablet Take 1 tablet (75 mg total) by mouth daily. **Restart Plavix on 04/29/2021** 04/26/21  Yes Val Eagle D, NP  cyanocobalamin 1000 MCG tablet Take 1,000 mcg by mouth daily.   Yes [provider]  Dulaglutide (TRULICITY) 3 MG/0.5ML SOAJ Inject 3 mg into the skin once a week. Patient taking differently: Inject 3 mg into the skin once a week. Inject on Wednesday 02/03/23  Yes   DULoxetine (CYMBALTA) 30 MG capsule Take 30 mg  by mouth daily. 06/05/21  Yes [provider]  ferrous sulfate 325 (65 FE) MG EC tablet Take 325 mg by mouth daily with breakfast.   Yes [provider]  gabapentin (NEURONTIN) 100 MG capsule Take 100 mg by mouth at bedtime. 10/09/14  Yes [provider]  insulin glargine-yfgn (SEMGLEE) 100 UNIT/ML injection Inject 18 Units into the skin daily.   Yes [provider]  Boris Lown Oil 500 MG CAPS Take 1 capsule by mouth daily.   Yes [provider]  Lysine 500 MG TABS Take 1 tablet by mouth daily.   Yes [provider]  meloxicam (MOBIC) 15 MG tablet Take 15 mg by mouth daily.   Yes [provider]  metFORMIN (GLUCOPHAGE) 500 MG tablet Take 2 tablets (1,000 mg total) by mouth 2 (two) times daily with a meal. Resume 10/03/2020 10/03/20  Yes Patwardhan, Manish J, MD  Multiple Vitamins-Minerals (MENS 50+ MULTI VITAMIN/MIN) TABS Take 1 tablet by mouth daily.   Yes [provider]  nitroGLYCERIN (NITROSTAT) 0.4 MG SL tablet Place 1 tablet (0.4 mg total) under the tongue every 5 (five) minutes as needed for chest pain. 09/02/20 03/14/23 Yes Patwardhan, Manish J, MD  prazosin (MINIPRESS) 1 MG capsule Take 1 mg by mouth at bedtime. 02/17/23  Yes [provider]  rOPINIRole (REQUIP) 4 MG tablet Take 4 mg by mouth at bedtime.   Yes [provider]  rosuvastatin (CRESTOR) 40 MG tablet TAKE 1 TABLET(40 MG) BY MOUTH DAILY 06/25/22  Yes Patwardhan, Manish J, MD  sildenafil (VIAGRA) 25 MG tablet Take by mouth. 02/04/23  Yes [provider]  TRESIBA FLEXTOUCH 100 UNIT/ML FlexTouch Pen daily. Inject on Wednesday   Yes [provider]      Allergies    Atorvastatin, Niacin, Levaquin [levofloxacin in d5w], Niaspan [niacin er (antihyperlipidemic)], and Tramadol    Review of Systems   Review of Systems  All other systems reviewed and are negative.   Physical Exam Updated Vital Signs BP (!) 95/53 (BP Location: Right  Arm)   Pulse 83   Temp 98.7 F (37.1 C)   Resp 18   Wt 79.3 kg   SpO2 98%   BMI 22.45 kg/m  Physical Exam Vitals and nursing note reviewed.  Constitutional:      Appearance: He is well-developed.  HENT:     Head: Normocephalic.     Comments: Abrasion with hematoma to the left posterior scalp Cardiovascular:     Rate and Rhythm: Normal rate and regular rhythm.     Heart sounds: No murmur heard. Pulmonary:     Effort: Pulmonary effort is normal. No respiratory distress.     Breath sounds: Normal breath sounds.  Abdominal:     Palpations: Abdomen is soft.     Tenderness: There is no abdominal tenderness. There is no guarding or rebound.  Musculoskeletal:        General: No tenderness.  Skin:    General: Skin is warm and dry.  Neurological:     Mental Status: He is alert.     Comments: RUE drift.  Right pupil blown.  Sensation to light touch intact in all four extremities.  Oriented to person place and time  Psychiatric:        Behavior: Behavior normal.     ED Results / Procedures / Treatments   Labs (all labs ordered are listed, but only abnormal results are displayed) Labs Reviewed  CBC - Abnormal; Notable for the following components:      Result Value   WBC 13.7 (*)    RBC 3.56 (*)    Hemoglobin 11.1 (*)    HCT 34.4 (*)    All other components within normal limits  DIFFERENTIAL - Abnormal; Notable for the following components:   Neutro Abs 10.3 (*)    Monocytes Absolute 1.4 (*)    All other components within normal limits  COMPREHENSIVE METABOLIC PANEL - Abnormal; Notable for the following components:   Creatinine, Ser 1.34 (*)    Total Protein 6.4 (*)    Albumin 3.3 (*)    Total Bilirubin 1.4 (*)    GFR, Estimated 53 (*)    All other components within normal limits  I-STAT CHEM 8, ED - Abnormal; Notable for the following components:   BUN 30 (*)    Creatinine, Ser 1.30 (*)    Calcium, Ion 1.11 (*)    Hemoglobin 11.2 (*)    HCT 33.0 (*)    All other  components within normal limits  CBG MONITORING, ED - Abnormal; Notable for the following components:   Glucose-Capillary 68 (*)    All other components within normal limits  CBG MONITORING, ED - Abnormal; Notable for the following components:   Glucose-Capillary 139 (*)    All other components within normal limits  RESP PANEL BY RT-PCR (RSV, FLU A&B, COVID)  RVPGX2  PROTIME-INR  APTT  ETHANOL  LIPID PANEL  I-STAT CG4 LACTIC ACID, ED    EKG EKG Interpretation Date/Time:  Sunday March 14 2023 00:40:27 EST Ventricular Rate:  92 PR Interval:  178 QRS Duration:  96 QT Interval:  358 QTC Calculation: 443 R Axis:   75  Text Interpretation: Sinus rhythm Low voltage, precordial leads Repol abnrm suggests ischemia, anterolateral Confirmed by Tilden Fossa [phone]) on 03/14/2023 12:44:47 AM  Radiology DG Chest Port 1 View Result Date: 03/14/2023 CLINICAL DATA:  Recent fall EXAM: PORTABLE CHEST 1 VIEW COMPARISON:  11/01/2022 FINDINGS: Cardiac shadows within normal limits. Aortic calcifications are noted. Skin fold is noted over the right chest. Minimal left basilar atelectasis is seen. No focal confluent infiltrate is noted. No bony abnormality is noted. IMPRESSION: Mild left basilar atelectasis. Electronically Signed   By: Alcide Clever M.D.   On: 03/14/2023 00:58   CT ANGIO HEAD NECK W WO CM (CODE STROKE) Result Date: 03/14/2023 CLINICAL DATA:  Right arm weakness, right pupil blown, head trauma EXAM: CT ANGIOGRAPHY HEAD AND NECK WITH AND WITHOUT CONTRAST TECHNIQUE: Multidetector CT imaging of the head and neck was performed using the standard protocol during bolus administration of intravenous contrast. Multiplanar CT image reconstructions and MIPs were obtained to evaluate the vascular anatomy. Carotid stenosis measurements (when applicable) are obtained utilizing NASCET criteria, using the distal internal carotid diameter as the denominator. RADIATION DOSE REDUCTION: This exam was  performed according to the departmental dose-optimization program which includes automated exposure control, adjustment of the mA and/or kV according to patient size and/or use of iterative reconstruction technique. CONTRAST:  75mL OMNIPAQUE IOHEXOL 350 MG/ML SOLN COMPARISON:  09/13/2022 CTA head and neck, correlation is made with 03/14/2023 CT head FINDINGS: CT HEAD FINDINGS For noncontrast findings, please see same day CT head. CTA NECK FINDINGS Aortic arch: Standard branching. Imaged portion shows no evidence of aneurysm or dissection. No significant stenosis of the major arch vessel origins. Aortic atherosclerosis Right carotid system: No evidence of dissection, occlusion, or hemodynamically significant stenosis (greater than 50%). Left carotid system: No evidence of dissection, occlusion, or hemodynamically significant stenosis (greater than 50%). Vertebral arteries: Moderate stenosis at the origin of the left vertebral artery. Evaluation of the proximal left vertebral artery is limited by beam hardening artifact from the adjacent contrast bolus. The left vertebral artery is otherwise patent to the skull base. Mild stenosis at the origin of the right vertebral artery. The right vertebral artery is otherwise patent to the skull base. No evidence of dissection. Skeleton: No acute osseous abnormality. Degenerative changes in the cervical spine. Other neck: No acute finding. Upper chest: No focal pulmonary opacity or pleural effusion. Review of the MIP images confirms the above findings CTA HEAD FINDINGS Anterior circulation: Both internal carotid arteries are patent to the termini, with mild stenosis in the bilateral cavernous segments and moderate to severe stenosis in the proximal supraclinoid segments bilaterally. A1 segments patent. Normal anterior communicating artery. Anterior cerebral arteries are patent to their distal aspects without significant stenosis. No M1 stenosis or occlusion. MCA branches are  irregular but perfused to their distal aspects without significant stenosis. Posterior circulation: Vertebral arteries patent to the vertebrobasilar junction, with mild stenosis in the proximal right V4. The left vertebral artery primarily supplies the left PICA, and is quite diminutive after the PICA takeoff. Posterior inferior cerebellar arteries patent proximally. Basilar patent to its distal aspect without significant stenosis. Superior cerebellar arteries patent proximally. The redemonstrated severe stenosis of the right PCA near the P1-P2 junction (series 7, image 99), with  the remainder the right PCA being diminutive and poorly opacified. Redemonstrated severe stenosis in the proximal left P2 (series 7, images 98-99), with multifocal irregularity in the remainder of the left P2. Evaluation of the more distal PCAs is limited by venous contamination. The bilateral posterior communicating arteries are not definitively seen. Venous sinuses: As permitted by contrast timing, patent. Anatomic variants: None significant. No evidence of aneurysm or vascular malformation. Review of the MIP images confirms the above findings IMPRESSION: 1. No intracranial large vessel occlusion. 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 2. 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 3. 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 4. Moderate stenosis at the origin of the left vertebral artery and mild stenosis at the origin of the right vertebral artery. 5. Aortic atherosclerosis. Aortic Atherosclerosis (ICD10-I70.0). Electronically Signed   By: Wiliam Ke M.D.   On: 03/14/2023 00:46   CT Cervical Spine Wo Contrast Result Date: 03/14/2023 CLINICAL DATA:  Neck trauma, fell backwards EXAM: CT CERVICAL SPINE WITHOUT  CONTRAST TECHNIQUE: Multidetector CT imaging of the cervical spine was performed without intravenous contrast. Multiplanar CT image reconstructions were also generated. RADIATION DOSE REDUCTION: This exam was performed according to the departmental dose-optimization program which includes automated exposure control, adjustment of the mA and/or kV according to patient size and/or use of iterative reconstruction technique. COMPARISON:  02/21/2023 CT cervical spine FINDINGS: Alignment: No traumatic listhesis. Straightening of the normal cervical lordosis. Trace stepwise anterolisthesis of C3 on C4 and C4 on C5. Skull base and vertebrae: No acute fracture. No primary bone lesion or focal pathologic process. Soft tissues and spinal canal: No prevertebral fluid or swelling. No visible canal hematoma. Disc levels: Degenerative changes in the cervical spine. No high-grade spinal canal stenosis. Upper chest: Negative. IMPRESSION: No acute fracture or traumatic listhesis in the cervical spine. Electronically Signed   By: Wiliam Ke M.D.   On: 03/14/2023 00:34   CT HEAD CODE STROKE WO CONTRAST Result Date: 03/14/2023 CLINICAL DATA:  Code stroke. Right arm weakness, right pupil blown, trauma to posterior head after fall EXAM: CT HEAD WITHOUT CONTRAST TECHNIQUE: Contiguous axial images were obtained from the base of the skull through the vertex without intravenous contrast. RADIATION DOSE REDUCTION: This exam was performed according to the departmental dose-optimization program which includes automated exposure control, adjustment of the mA and/or kV according to patient size and/or use of iterative reconstruction technique. COMPARISON:  02/21/2023 CT head FINDINGS: Brain: No evidence of acute infarction, hemorrhage, mass, mass effect, or midline shift. No hydrocephalus or extra-axial collection. Basal ganglia calcifications. Normal cerebral volume. Vascular: No hyperdense vessel. Atherosclerotic calcifications in the  intracranial carotid and vertebral arteries. Skull: Negative for fracture or focal lesion. Left occipital scalp hematoma. Sinuses/Orbits: No acute finding. Other: The mastoid air cells are well aerated. ASPECTS [facility] Stroke Program Early CT Score) - Ganglionic level infarction (caudate, lentiform nuclei, internal capsule, insula, M1-M3 cortex): 7 - Supraganglionic infarction (M4-M6 cortex): 3 Total score (0-10 with 10 being normal): 10 IMPRESSION: 1. No acute intracranial process. ASPECTS is 10. 2. Left occipital scalp hematoma. Imaging results were communicated on 03/14/2023 at 12:21 am to provider Dr. Wilford Corner via secure text paging. Electronically Signed   By: Wiliam Ke M.D.   On: 03/14/2023 00:21    Procedures Procedures   CRITICAL CARE  Performed by: Tilden Fossa   Total critical care time: 35 minutes  Critical care time was exclusive of separately billable procedures and treating other patients.  Critical care was necessary to treat or prevent imminent or life-threatening deterioration.  Critical care was time spent personally by me on the following activities: development of treatment plan with patient and/or surrogate as well as nursing, discussions with consultants, evaluation of patient's response to treatment, examination of patient, obtaining history from patient or surrogate, ordering and performing treatments and interventions, ordering and review of laboratory studies, ordering and review of radiographic studies, pulse oximetry and re-evaluation of patient's condition.  Medications Ordered in ED Medications  sodium chloride flush (NS) 0.9 % injection 3 mL (has no administration in time range)   stroke: early stages of recovery book (has no administration in time range)  0.9 %  sodium chloride infusion (has no administration in time range)  acetaminophen (TYLENOL) tablet 650 mg (has no administration in time range)    Or  acetaminophen (TYLENOL) 160 MG/5ML solution 650 mg  (has no administration in time range)    Or  acetaminophen (TYLENOL) suppository 650 mg (has no administration in time range)  senna-docusate (Senokot-S) tablet 1 tablet (has no administration in time range)  pantoprazole (PROTONIX) injection 40 mg (has no administration in time range)  labetalol (NORMODYNE) injection 20 mg (has no administration in time range)    And  clevidipine (CLEVIPREX) infusion 0.5 mg/mL (has no administration in time range)  lidocaine-EPINEPHrine-tetracaine (LET) topical gel (has no administration in time range)  iohexol (OMNIPAQUE) 350 MG/ML injection 75 mL (has no administration in time range)  dextrose 50 % solution 25 mL (25 mLs Intravenous Given 03/14/23 0010)  tenecteplase (TNKASE) injection for Stroke 20 mg (20 mg Intravenous Given 03/14/23 0032)  iohexol (OMNIPAQUE) 350 MG/ML injection 75 mL (75 mLs Intravenous Contrast Given 03/14/23 0032)  lidocaine-EPINEPHrine-tetracaine (LET) topical gel (3 mLs Topical Given 03/14/23 0054)    ED Course/ Medical Decision Making/ A&P                                 Medical Decision Making Amount and/or Complexity of Data Reviewed Labs: ordered. Radiology: ordered.  Risk Prescription drug management. Decision regarding hospitalization.   Patient here as a code stroke following a fall with right-sided deficits.  He does have a drift to the right upper extremity as well as a dilated right pupil.  He does have an abrasion over his posterior scalp that is hemostatic.  He was evaluated by neurology in the ED.  CT head and C-spine were negative for acute intracranial, cervical spine injury.  TNK was given.  Initial assessment he had no abdominal tenderness.  After return to room patient did have a noted abrasion to the anterior abdominal wall but on reassessment has no tenderness.  Given this developing abrasion will check CT chest on pelvis to evaluate for secondary intra thoracic or intra-abdominal injury secondary to the  fall in setting of TNK.  Plan to admit to neuro ICU for ongoing care.        Final Clinical Impression(s) / ED Diagnoses Final diagnoses:  Acute CVA (cerebrovascular accident) (HCC)  Abrasion of scalp, initial encounter  Fall, initial encounter    Rx / DC Orders ED Discharge Orders     None         Tilden Fossa, MD 03/14/23 0403

## 2023-03-14 NOTE — H&P (Addendum)
NEUROLOGY H&P NOTE   Date of service: March 14, 2023 Patient Name: Harry Zamora MRN:  [id number] DOB:  1941-01-28 Chief Complaint: "fall, right sided weakness"  History of Present Illness  Harry Zamora is a 82 y.o. male  has a past medical history of Anemia, Arthritis, Back pain, Depression, Disc displacement, lumbar, Gynecomastia, History of kidney stones, Hyperlipidemia, Hypertension, Hypertriglyceridemia, Insomnia, Low back pain, Lumbar radiculopathy, Lumbar stenosis, OA (osteoarthritis), Polyneuropathy in diabetes(357.2), Prostate cancer (HCC) (2003), Restless legs, Ringing in ears, RLS (restless legs syndrome), and Type 2 diabetes mellitus (HCC).  Last known well around 11 PM when he went to the bathroom and on returning from the bathroom had a fall hit the back of his head had a small laceration in the back of the head and EMS was called with this.  They noted him to be weak on the right and activated a code stroke as he was within the window for treatment.  Attempted to reach his wife at least 6 times over the phone-went straight to voicemail.  Called the other number as well, same outcome-went to voicemail.  Called the daughter who lives out of town, did not have any history about his last known well. Patient reports that he fell right before EMS was called and a few minutes before that was completely normal so going bilateral level history, last known well is around 11 as the fall happened around 11:15 PM. Risk benefits alternatives of TNK discussed with the patient 1  Recently been sick with influenza for the past few days.  Patient is being followed by [facility] neurology for orthostatic hypotension and frequent falls but there has been no mention of any focal weakness in the history.  Also, no right-sided weakness or pupillary asymmetry was listed in the last exam of 01/15/2023.  MoCA score 20 during that same evaluation.   Last known well: 11 PM Modified rankin score: 0-Completely  asymptomatic and back to baseline post- stroke IV Thrombolysis: Yes Thrombectomy: No ELVO NIHSS components Score: Comment  1a Level of Conscious 0[x]  1[]  2[]  3[]      1b LOC Questions 0[x]  1[]  2[]       1c LOC Commands 0[x]  1[]  2[]       2 Best Gaze 0[x]  1[]  2[]       3 Visual 0[x]  1[]  2[]  3[]      4 Facial Palsy 0[x]  1[]  2[]  3[]      5a Motor Arm - left 0[x]  1[]  2[]  3[]  4[]  UN[]    5b Motor Arm - Right 0[]  1[x]  2[]  3[]  4[]  UN[]    6a Motor Leg - Left 0[x]  1[]  2[]  3[]  4[]  UN[]    6b Motor Leg - Right 0[]  1[x]  2[]  3[]  4[]  UN[]    7 Limb Ataxia 0[]  1[]  2[x]  3[]  UN[]     8 Sensory 0[x]  1[]  2[]  UN[]      9 Best Language 0[x]  1[]  2[]  3[]      10 Dysarthria 0[]  1[x]  2[]  UN[]      11 Extinct. and Inattention 0[x]  1[]  2[]       TOTAL: 5      ROS  Comprehensive ROS performed and pertinent positives documented in the HPI  Past History   Past Medical History:  Diagnosis Date   Anemia    as an infant   Arthritis    Back pain    Depression    Disc displacement, lumbar    Gynecomastia    History of kidney stones    Hyperlipidemia    Hypertension  no longer on medications   Hypertriglyceridemia    Insomnia    Low back pain    Lumbar radiculopathy    Lumbar stenosis    OA (osteoarthritis)    Polyneuropathy in diabetes(357.2)    Prostate cancer (HCC) 2003   Restless legs    Ringing in ears    RLS (restless legs syndrome)    Type 2 diabetes mellitus (HCC)    Past Surgical History:  Procedure Laterality Date   ABDOMINAL EXPOSURE N/A 07/13/2019   Procedure: ABDOMINAL EXPOSURE;  Surgeon: Nada Libman, MD;  Location: [facility] OR;  Service: Vascular;  Laterality: N/A;  anterior approach   ANTERIOR LAT LUMBAR FUSION Right 07/13/2019   Procedure: Right Lumbar 3-4 Lumbar 4-5 Anterolateral lumbar interbody fusion;  Surgeon: Maeola Harman, MD;  Location: [facility] OR;  Service: Neurosurgery;  Laterality: Right;   ANTERIOR LUMBAR FUSION N/A 07/13/2019   Procedure: Lumbar Five-Sacral One Anterior lumbar  interbody fusion;  Surgeon: Maeola Harman, MD;  Location: [facility] OR;  Service: Neurosurgery;  Laterality: N/A;  Anterior approach   APPLICATION OF INTRAOPERATIVE CT SCAN N/A 04/24/2021   Procedure: APPLICATION OF INTRAOPERATIVE CT SCAN;  Surgeon: Dawley, Alan Mulder, DO;  Location: [facility] OR;  Service: Neurosurgery;  Laterality: N/A;  3C/RM 21   CHOLECYSTECTOMY     COLONOSCOPY     CORONARY STENT INTERVENTION N/A 10/01/2020   Procedure: CORONARY STENT INTERVENTION;  Surgeon: Elder Negus, MD;  Location: [facility] INVASIVE CV LAB;  Service: Cardiovascular;  Laterality: N/A;   EYE SURGERY Bilateral    GALLBLADDER SURGERY     HEMORRHOID SURGERY  1983   LEFT HEART CATH AND CORONARY ANGIOGRAPHY N/A 09/17/2020   Procedure: LEFT HEART CATH AND CORONARY ANGIOGRAPHY;  Surgeon: Elder Negus, MD;  Location: [facility] INVASIVE CV LAB;  Service: Cardiovascular;  Laterality: N/A;   LUMBAR LAMINECTOMY/DECOMPRESSION MICRODISCECTOMY N/A 10/02/2016   Procedure: L3 to S1 Laminectomy;  Surgeon: Maeola Harman, MD;  Location: [facility] OR;  Service: Neurosurgery;  Laterality: N/A;  L3 to S1 Laminectomy   LUMBAR LAMINECTOMY/DECOMPRESSION MICRODISCECTOMY Bilateral 09/07/2019   Procedure: Bilateral Lumbar Five - Sacral One Foraminotomy;  Surgeon: Maeola Harman, MD;  Location: [facility] OR;  Service: Neurosurgery;  Laterality: Bilateral;  posterior   LUMBAR PERCUTANEOUS PEDICLE SCREW 3 LEVEL N/A 07/13/2019   Procedure: Percutaneous pedicle screw fixation from Lumbar 3 to Sacral 1;  Surgeon: Maeola Harman, MD;  Location: [facility] OR;  Service: Neurosurgery;  Laterality: N/A;   MENISCUS REPAIR Right    PROSTATE SURGERY     SHOULDER SURGERY Left 01/2011   rotator cuff   Family History  Problem Relation Age of Onset   Arthritis Mother    Hypertension Mother    Diabetes Mother    Heart attack Mother    Cancer Father    Social History   Socioeconomic History   Marital status: Married    Spouse name: Jola Babinski   Number of children: 3   Years of  education: 12   Highest education level: Not on file  Occupational History   Occupation: Chief Strategy Officer: [employer]  Tobacco Use   Smoking status: Some Days    Types: Cigars   Smokeless tobacco: Never   Tobacco comments:    occasional  Vaping Use   Vaping status: Never Used  Substance and Sexual Activity   Alcohol use: Not Currently    Comment: social-beer   Drug use: No   Sexual activity: Not on file  Other Topics Concern  Not on file  Social History Narrative   Patient is married Jola Babinski) and lives at home with his wife.   Patient has three children and two- step children.   Patient is working full-time.   Patient has a high school education.   Patient is right-handed.   Patient drinks two cups of coffee, 2-3 sodas and one cup of tea daily.   Social Drivers of Corporate investment banker Strain: Not on file  Food Insecurity: Not on file  Transportation Needs: Not on file  Physical Activity: Not on file  Stress: Not on file  Social Connections: Not on file   Allergies  Allergen Reactions   Atorvastatin Other (See Comments)   Niacin     Other Reaction(s): break out in hives   Levaquin [Levofloxacin In D5w] Rash   Niaspan [Niacin Er (Antihyperlipidemic)] Rash and Other (See Comments)    Flushing reaction   Tramadol Rash    Medications   Current Facility-Administered Medications:    dextrose 50 % solution 25 mL, 25 mL, Intravenous, Once, Tilden Fossa, MD   dextrose 50 % solution, , , ,    sodium chloride flush (NS) 0.9 % injection 3 mL, 3 mL, Intravenous, Once, Tilden Fossa, MD   tenecteplase [facility]) injection for Stroke 20 mg, 0.25 mg/kg, Intravenous, Once, Milon Dikes, MD  Current Outpatient Medications:    ascorbic acid (VITAMIN C) 500 MG tablet, Take 500 mg by mouth daily., Disp: , Rfl:    Calcium Carb-Cholecalciferol (CALCIUM 1000 + D PO), Take by mouth., Disp: , Rfl:    clopidogrel (PLAVIX) 75 MG tablet, Take 1 tablet (75 mg total) by  mouth daily. **Restart Plavix on 04/29/2021**, Disp: 90 tablet, Rfl: 3   Continuous Blood Gluc Sensor (FREESTYLE LIBRE 3 SENSOR) MISC, , Disp: , Rfl:    Dulaglutide (TRULICITY) 3 MG/0.5ML SOAJ, Inject 3 mg into the skin once a week., Disp: 12 mL, Rfl: 3   DULoxetine (CYMBALTA) 30 MG capsule, Take 30 mg by mouth daily., Disp: , Rfl:    Ferrous Sulfate Dried (HIGH POTENCY IRON) 65 MG TABS, Take by mouth., Disp: , Rfl:    gabapentin (NEURONTIN) 100 MG capsule, Take 100 mg by mouth at bedtime., Disp: , Rfl:    HYDROcodone-acetaminophen (NORCO) 5-325 MG tablet, Take 1 tablet by mouth every 8 (eight) hours as needed for moderate pain., Disp: 10 tablet, Rfl: 0   meloxicam (MOBIC) 15 MG tablet, Take 15 mg by mouth daily., Disp: , Rfl:    metFORMIN (GLUCOPHAGE) 500 MG tablet, Take 2 tablets (1,000 mg total) by mouth 2 (two) times daily with a meal. Resume 10/03/2020 (Patient taking differently: Take 500 mg by mouth 2 (two) times daily with a meal. Resume 10/03/2020), Disp: , Rfl: 1   metoprolol succinate (TOPROL-XL) 25 MG 24 hr tablet, Take 25 mg by mouth daily., Disp: , Rfl:    MULTIPLE MINERALS PO, Take by mouth., Disp: , Rfl:    nitroGLYCERIN (NITROSTAT) 0.4 MG SL tablet, Place 1 tablet (0.4 mg total) under the tongue every 5 (five) minutes as needed for chest pain., Disp: 30 tablet, Rfl: 3   pregabalin (LYRICA) 75 MG capsule, 1 capsule Oral TID, Disp: , Rfl:    rOPINIRole (REQUIP) 4 MG tablet, Take 4 mg by mouth at bedtime., Disp: , Rfl:    rosuvastatin (CRESTOR) 40 MG tablet, TAKE 1 TABLET(40 MG) BY MOUTH DAILY, Disp: 90 tablet, Rfl: 3   Semaglutide, 1 MG/DOSE, (OZEMPIC, 1 MG/DOSE,) 4 MG/3ML SOPN, inject  1mg  Subcutaneous weekly, Disp: , Rfl:    traMADol (ULTRAM) 50 MG tablet, 1 tablet Oral Once a day, Disp: , Rfl:    TRESIBA FLEXTOUCH 100 UNIT/ML FlexTouch Pen, daily., Disp: , Rfl:    vitamin B-12 (CYANOCOBALAMIN) 500 MCG tablet, Take 500 mcg by mouth daily., Disp: , Rfl:    Vitals   Vitals:    03/14/23 0000  Weight: 79.3 kg     Body mass index is 22.45 kg/m.  Physical Exam  General: Awake alert in no distress HEENT: Laceration on the posterior head-see ED provider note.  Neck in c-collar CVs: Regular rhythm Abdomen nondistended nontender, redness/superficial abrasion LLQ Extremities warm well-perfused Neurological exam Awake alert oriented x 3 Mildly dysarthric No aphasia Cranial nerves II to XII reveal right pupil 4 mm with left pupil 2 mm round and reactive, extraocular movements intact, visual fields full, face grossly symmetric, tongue and palate midline. Motor examination with mild drift in the right upper and lower extremity.  Right upper extremity weaker than right lower extremity. Left side full strength Sensation intact light touch Coordination with dysmetria on the right Gait testing deferred  Labs   CBC:  Recent Labs  Lab 03/14/23 0010 03/14/23 0015  WBC 13.7*  --   NEUTROABS 10.3*  --   HGB 11.1* 11.2*  HCT 34.4* 33.0*  MCV 96.6  --   PLT 188  --    Basic Metabolic Panel:  Lab Results  Component Value Date   NA 137 03/14/2023   K 4.3 03/14/2023   CO2 30 02/21/2023   GLUCOSE 74 03/14/2023   BUN 30 (H) 03/14/2023   CREATININE 1.30 (H) 03/14/2023   CALCIUM 8.8 (L) 02/21/2023   GFRNONAA 47 (L) 02/21/2023   GFRAA 56 (L) 09/05/2019   Lipid Panel:  Lab Results  Component Value Date   LDLCALC 82 10/03/2020   HgbA1c:  Lab Results  Component Value Date   HGBA1C 7.5 (H) 11/18/2022   Urine Drug Screen: No results found for: "LABOPIA", "COCAINSCRNUR", "LABBENZ", "AMPHETMU", "THCU", "LABBARB"  Alcohol Level No results found for: "ETH" INR  Lab Results  Component Value Date   INR 1.2 03/14/2023   APTT  Lab Results  Component Value Date   APTT 28 03/14/2023   Chest x-ray unremarkable  CT Head without contrast(Personally reviewed): Aspects 10  CT angio Head and Neck with contrast(Personally reviewed): No ELVO.  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   Assessment   Harry Zamora is a 82 y.o. male with above past medical history including that of frequent falls presented to the emergency department after having a fall and then noted to be weak on the right upper extremity worse than right lower extremity.  Left side full strength.  Also had aciduria which is presumably new. Question if this was a fall from orthostatic hypotension versus hypoglycemia versus a new stroke.  He was somewhat hypoglycemic on arrival in the 60s and sugars were corrected but his right arm still remained much weaker in comparison to the left arm and right leg also had mild drift. Risk-benefit and alternatives of TNK discussed with the patient after multiple attempts made to reach the wife with phone going straight on voicemail. He agreed to [facility] which was administered. He will be admitted to the ICU for post TNK care  Impression: Right-sided weakness-likely acute ischemic stroke-status post IV TNKase Laceration on the posterior scalp as a  result of the fall-compressible site hence TNK was not withheld History of orthostatic hypotension History of frequent falls History of diabetic peripheral neuropathy History of cognitive impairment with last MoCA score of 20 Recent viral respiratory illness-check respiratory panel for influenza.  Chest x-ray unremarkable  Recommendations  Admit to neuro ICU Post TNK vitals and neurochecks No antiplatelets or anticoagulants unless the 24 hours scan is negative for hemorrhage. Telemetry 2D echo A1c Lipid panel Gentle hydration with IV fluids normal saline 75 cc an hour MRI of the brain at 24 hours-ordered for 11:30 PM on 03/14/2023 Leukocytosis likely due to the recent flu.  Monitor vitals and fever curve.  Check BMP and replete electrolytes as necessary. PT OT speech therapy Blood pressure goal  180/105 or below.  Cleviprex drip and PRNs ordered in case necessary  With appropriate parameters. N.p.o. until cleared by bedside swallow evaluation or formal swallow evaluation Per EDP, LET (lidocaine epinephrine tetracaine) gel to be applied on the head wound and wound to be dropped.  Check the wound for any increase in hematoma size. CT abd due to abrasions and unclear history around falls. Sliding scale insulin Zofran as needed for nausea  Bowel: Docusate senna GI: PPI CODE STATUS: Full code  Stroke team to continue to follow Plan was discussed with Dr. Pecola Leisure, EDP.  Attempted to call family multiple times-unsuccessful   Risks, benefits and alternatives of IVT discussed with patient and/or family and they agreed. CTH personally reviewed prior to TNK administration    Addendum Son-in-law arrived later Confirms history as above Also reports that the flulike symptoms have been for 2 days Getting respiratory panel, positive for flu, will start oseltamivir.   ______________________________________________________________________   Signed, Milon Dikes, MD [facility]  CRITICAL CARE ATTESTATION Performed by: Milon Dikes, MD Total critical care time: 40 minutes Critical care time was exclusive of separately billable procedures and treating other patients and/or supervising APPs/Residents/Students Critical care was necessary to treat or prevent imminent or life-threatening deterioration. This patient is critically ill and at significant risk for neurological worsening and/or death and care requires constant monitoring. Critical care was time spent personally by me on the following activities: development of treatment plan with patient and/or surrogate as well as nursing, discussions with consultants, evaluation of patient's response to treatment, examination of patient, obtaining history from patient or surrogate, ordering and performing treatments and interventions,  ordering and review of laboratory studies, ordering and review of radiographic studies, pulse oximetry, re-evaluation of patient's condition, participation in multidisciplinary rounds and medical decision making of high complexity in the care of this patient.

## 2023-03-14 NOTE — Progress Notes (Signed)
PHARMACIST CODE STROKE RESPONSE  Notified to mix TNK at 1228 by Dr. Wilford Corner TNK preparation completed at 1232  TNK dose = 20 mg IV over 5 seconds   Eddie Candle 03/14/23 12:32 AM

## 2023-03-15 ENCOUNTER — Other Ambulatory Visit: Payer: Self-pay

## 2023-03-15 ENCOUNTER — Inpatient Hospital Stay (HOSPITAL_COMMUNITY): Payer: Medicare HMO

## 2023-03-15 DIAGNOSIS — I6389 Other cerebral infarction: Secondary | ICD-10-CM

## 2023-03-15 LAB — ECHOCARDIOGRAM COMPLETE
AR max vel: 2.24 cm2
AV Area VTI: 2.57 cm2
AV Area mean vel: 2.24 cm2
AV Mean grad: 5 mm[Hg]
AV Peak grad: 9.5 mm[Hg]
Ao pk vel: 1.54 m/s
Area-P 1/2: 3.68 cm2
Height: 74 in
S' Lateral: 2.8 cm
Weight: 2377.44 [oz_av]

## 2023-03-15 LAB — BASIC METABOLIC PANEL
Anion gap: 10 (ref 5–15)
BUN: 34 mg/dL — ABNORMAL HIGH (ref 8–23)
CO2: 23 mmol/L (ref 22–32)
Calcium: 7.9 mg/dL — ABNORMAL LOW (ref 8.9–10.3)
Chloride: 101 mmol/L (ref 98–111)
Creatinine, Ser: 1.88 mg/dL — ABNORMAL HIGH (ref 0.61–1.24)
GFR, Estimated: 35 mL/min — ABNORMAL LOW (ref >60)
Glucose, Bld: 99 mg/dL (ref 70–99)
Potassium: 3.9 mmol/L (ref 3.5–5.1)
Sodium: 134 mmol/L — ABNORMAL LOW (ref 135–145)

## 2023-03-15 LAB — CBC
HCT: 24.4 % — ABNORMAL LOW (ref 39.0–52.0)
Hemoglobin: 8 g/dL — ABNORMAL LOW (ref 13.0–17.0)
MCH: 30.9 pg (ref 26.0–34.0)
MCHC: 32.8 g/dL (ref 30.0–36.0)
MCV: 94.2 fL (ref 80.0–100.0)
Platelets: 136 10*3/uL — ABNORMAL LOW (ref 150–400)
RBC: 2.59 MIL/uL — ABNORMAL LOW (ref 4.22–5.81)
RDW: 13 % (ref 11.5–15.5)
WBC: 6.6 10*3/uL (ref 4.0–10.5)
nRBC: 0 % (ref 0.0–0.2)

## 2023-03-15 LAB — GLUCOSE, CAPILLARY
Glucose-Capillary: 102 mg/dL — ABNORMAL HIGH (ref 70–99)
Glucose-Capillary: 120 mg/dL — ABNORMAL HIGH (ref 70–99)
Glucose-Capillary: 146 mg/dL — ABNORMAL HIGH (ref 70–99)
Glucose-Capillary: 146 mg/dL — ABNORMAL HIGH (ref 70–99)
Glucose-Capillary: 167 mg/dL — ABNORMAL HIGH (ref 70–99)
Glucose-Capillary: 197 mg/dL — ABNORMAL HIGH (ref 70–99)

## 2023-03-15 LAB — HEMOGLOBIN AND HEMATOCRIT, BLOOD
HCT: 24.5 % — ABNORMAL LOW (ref 39.0–52.0)
Hemoglobin: 8.1 g/dL — ABNORMAL LOW (ref 13.0–17.0)

## 2023-03-15 MED ORDER — SODIUM CHLORIDE 0.9 % IV BOLUS
1000.0000 mL | Freq: Once | INTRAVENOUS | Status: AC
  Administered 2023-03-15: 1000 mL via INTRAVENOUS

## 2023-03-15 MED ORDER — OSELTAMIVIR PHOSPHATE 30 MG PO CAPS
30.0000 mg | ORAL_CAPSULE | Freq: Every day | ORAL | Status: AC
  Administered 2023-03-16 – 2023-03-18 (×3): 30 mg via ORAL
  Filled 2023-03-15 (×3): qty 1

## 2023-03-15 NOTE — Plan of Care (Signed)
  Problem: Education: Goal: Knowledge of disease or condition will improve Outcome: Progressing Goal: Knowledge of secondary prevention will improve (MUST DOCUMENT ALL) Outcome: Progressing   Problem: Ischemic Stroke/TIA Tissue Perfusion: Goal: Complications of ischemic stroke/TIA will be minimized Outcome: Progressing   Problem: Coping: Goal: Will identify appropriate support needs Outcome: Progressing   Problem: Self-Care: Goal: Ability to communicate needs accurately will improve Outcome: Progressing

## 2023-03-15 NOTE — Progress Notes (Signed)
  Echocardiogram 2D Echocardiogram has been performed.  Harry Zamora 03/15/2023, 10:05 AM

## 2023-03-15 NOTE — Progress Notes (Signed)
STROKE TEAM PROGRESS NOTE   SUBJECTIVE (INTERVAL HISTORY) His RN is at the bedside.  Overall his condition is stable. Pt AAO x 3, no complains, still has RUE ataxia and mild RLE dysmetria. No significant weakness. MRI no stroke found. Hb drop to 8, but no overt bleeding. Scalp bleeding has stopped. BP improved with midodrine. On TED hose.   OBJECTIVE Temp:  [97.5 F (36.4 C)-98.6 F (37 C)] 97.7 F (36.5 C) (12/30 0800) Pulse Rate:  [70-99] 87 (12/30 0900) Cardiac Rhythm: Normal sinus rhythm (12/30 0800) Resp:  [11-29] 24 (12/30 0900) BP: (95-151)/(51-82) 132/54 (12/30 0900) SpO2:  [81 %-100 %] 98 % (12/30 0800)  Recent Labs  Lab 03/14/23 1605 03/14/23 1958 03/15/23 0007 03/15/23 0343 03/15/23 0742  GLUCAP 140* 204* 167* 102* 120*   Recent Labs  Lab 03/14/23 0010 03/14/23 0015 03/15/23 0452  NA 136 137 134*  K 3.8 4.3 3.9  CL 102 101 101  CO2 25  --  23  GLUCOSE 74 74 99  BUN 19 30* 34*  CREATININE 1.34* 1.30* 1.88*  CALCIUM 9.1  --  7.9*   Recent Labs  Lab 03/14/23 0010  AST 35  ALT 20  ALKPHOS 64  BILITOT 1.4*  PROT 6.4*  ALBUMIN 3.3*   Recent Labs  Lab 03/14/23 0010 03/14/23 0015 03/15/23 0452 03/15/23 0751  WBC 13.7*  --  6.6  --   NEUTROABS 10.3*  --   --   --   HGB 11.1* 11.2* 8.0* 8.1*  HCT 34.4* 33.0* 24.4* 24.5*  MCV 96.6  --  94.2  --   PLT 188  --  136*  --    No results for input(s): "CKTOTAL", "CKMB", "CKMBINDEX", "TROPONINI" in the last 168 hours. Recent Labs    03/14/23 0010  LABPROT 15.2  INR 1.2   No results for input(s): "COLORURINE", "LABSPEC", "PHURINE", "GLUCOSEU", "HGBUR", "BILIRUBINUR", "KETONESUR", "PROTEINUR", "UROBILINOGEN", "NITRITE", "LEUKOCYTESUR" in the last 72 hours.  Invalid input(s): "APPERANCEUR"     Component Value Date/Time   CHOL 61 03/14/2023 0405   CHOL 137 10/03/2020 1138   TRIG 47 03/14/2023 0405   HDL 33 (L) 03/14/2023 0405   HDL 33 (L) 10/03/2020 1138   CHOLHDL 1.8 03/14/2023 0405   VLDL 9  03/14/2023 0405   LDLCALC 19 03/14/2023 0405   LDLCALC 82 10/03/2020 1138   Lab Results  Component Value Date   HGBA1C 7.5 (H) 11/18/2022   No results found for: "LABOPIA", "COCAINSCRNUR", "LABBENZ", "AMPHETMU", "THCU", "LABBARB"  Recent Labs  Lab 03/14/23 0010  ETH <10    I have personally reviewed the radiological images below and agree with the radiology interpretations.  CT ABDOMEN PELVIS WO CONTRAST Result Date: 03/15/2023 CLINICAL DATA:  Abdominal pain. Acute decrease in hemoglobin with tender abdomen. Evaluate for hematoma EXAM: CT ABDOMEN AND PELVIS WITHOUT CONTRAST TECHNIQUE: Multidetector CT imaging of the abdomen and pelvis was performed following the standard protocol without IV contrast. RADIATION DOSE REDUCTION: This exam was performed according to the departmental dose-optimization program which includes automated exposure control, adjustment of the mA and/or kV according to patient size and/or use of iterative reconstruction technique. COMPARISON:  03/14/2023 FINDINGS: Lower chest: Dependent collapse/consolidation in both lower lobes is progressive in the interval with areas of peripheral airway impaction in the dependent lower lungs. Hepatobiliary: No suspicious focal abnormality in the liver on this study without intravenous contrast. Cholecystectomy. No intrahepatic or extrahepatic biliary dilation. Pancreas: No focal mass lesion. No dilatation of the main duct. No  intraparenchymal cyst. Diffuse atrophy. Spleen: No splenomegaly. No suspicious focal mass lesion. Adrenals/Urinary Tract: No adrenal nodule or mass. Small nonobstructing stones are evident in both kidneys. High attenuation in the renal pelvis of each kidney in the ureters and the bladder lumen is compatible with excreted contrast from yesterday's CT scan. No hydroureteronephrosis. Bladder lumen opacified but otherwise unremarkable. Stomach/Bowel: Stomach is unremarkable. No gastric wall thickening. No evidence of  outlet obstruction. Duodenum is normally positioned as is the ligament of Treitz. No small bowel wall thickening. No small bowel dilatation. The terminal ileum is normal. The appendix is normal. The tip of the appendix appears to be filled with soft tissue (51/3) without distension. This finding is stable comparing back to a CT from 03/24/2021 but appears minimally progressive when comparing to 09/12/2020. No gross colonic mass. No colonic wall thickening. Moderate to large stool volume evident. Vascular/Lymphatic: There is moderate atherosclerotic calcification of the abdominal aorta without aneurysm. There is no gastrohepatic or hepatoduodenal ligament lymphadenopathy. No retroperitoneal or mesenteric lymphadenopathy. No pelvic sidewall lymphadenopathy. 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 Other: No intraperitoneal free fluid. Musculoskeletal: No pelvic sidewall or retroperitoneal hematoma. There is some minimal thickening of the right posterior renal fascia, stable to minimally progressive since 03/14/2023, but this is only minimal and is nonspecific, not representing a discrete hematoma or substantial hemorrhage. Lumbar fusion hardware evident. The patient is noted to have asymmetry of the gluteus musculature inferiorly with asymmetric enlargement on the left although this is stable to minimally increased compared to 03/14/2023 and has been incompletely visualized. IMPRESSION: 1. No pelvic sidewall or retroperitoneal hematoma. There is some minimal thickening of the right posterior renal fascia, stable to minimally progressive since 03/14/2023, but this is only minimal and is nonspecific, not representing a discrete hematoma or substantial hemorrhage. 2. Asymmetry of the gluteus musculature inferiorly with asymmetric enlargement on the left although this is stable to may be minimally increased compared to 03/14/2023 and has been incompletely visualized. There is not a discrete hematoma  a discernible on this study but a component of intramuscular hemorrhage could have this appearance. This would likely be amenable to clinical inspection. 3. Dependent collapse/consolidation in both lower lobes is progressive in the interval with areas of peripheral airway impaction in the dependent lower lungs. Imaging features compatible may be infectious although aspiration could have this appearance. 4. The tip of the appendix appears to be filled with soft tissue without distension. This finding is stable comparing back to a CT from 03/24/2021 but appears minimally progressive when comparing to 09/12/2020. This is a nonspecific finding and may be related to mucocele. Follow-up CT in 6 months recommended. Non emergent follow-up surgical consultation recommended. 5. Small nonobstructing stones in both kidneys. 6.  Aortic Atherosclerosis (ICD10-I70.0). Electronically Signed   By: Kennith Center M.D.   On: 03/15/2023 06:24   MR BRAIN WO CONTRAST Result Date: 03/15/2023 CLINICAL DATA:  Stroke/TIA.  Status post T NK. EXAM: MRI HEAD WITHOUT CONTRAST TECHNIQUE: Multiplanar, multiecho pulse sequences of the brain and surrounding structures were obtained without intravenous contrast. COMPARISON:  01/18/2023 FINDINGS: Brain: No acute infarct, mass effect or extra-axial collection. 3-5 chronic microhemorrhages in the cerebellum. There is multifocal hyperintense T2-weighted signal within the white matter. Parenchymal volume and CSF spaces are normal. The midline structures are normal. Vascular: Normal flow voids. Skull and upper cervical spine: Posterior scalp subgaleal collection. Sinuses/Orbits:No paranasal sinus fluid levels or advanced mucosal thickening. No mastoid or middle ear effusion. Normal orbits. IMPRESSION: 1.  No acute intracranial abnormality. 2. Findings of chronic small vessel ischemia. 3. Posterior scalp subgaleal hematoma. Electronically Signed   By: Deatra Robinson M.D.   On: 03/15/2023 00:01   CT HEAD  WO CONTRAST ( ) Result Date: 03/14/2023 CLINICAL DATA:  Stroke, follow-up. Persistent right upper extremity ataxia and right lower extremity dysmetria. Status post TNK. EXAM: CT HEAD WITHOUT CONTRAST TECHNIQUE: Contiguous axial images were obtained from the base of the skull through the vertex without intravenous contrast. RADIATION DOSE REDUCTION: This exam was performed according to the departmental dose-optimization program which includes automated exposure control, adjustment of the mA and/or kV according to patient size and/or use of iterative reconstruction technique. COMPARISON:  CT head and CT angio head and neck 03/14/2023 at 12:15 a.m. FINDINGS: Brain: Mild atrophy and white matter disease is stable. No acute infarct or hemorrhage is present. Deep brain nuclei are within normal limits. The ventricles are proportionate to the degree of atrophy. No significant extraaxial fluid collection is present. The brainstem and cerebellum are within normal limits. Midline structures are within normal limits. Vascular: Atherosclerotic calcifications are again noted within the cavernous internal carotid arteries bilaterally and at the dural margin of the right vertebral artery. No hyperdense vessel is present. Skull: A left parietal and occipital scalp hematoma is new. No acute fracture is present. Sinuses/Orbits: The paranasal sinuses and mastoid air cells are clear. Bilateral lens replacements are noted. Globes and orbits are otherwise unremarkable. IMPRESSION: 1. No acute intracranial abnormality or significant interval change. 2. Stable atrophy and white matter disease. This likely reflects the sequela of chronic microvascular ischemia. 3. New left parietal and occipital scalp hematoma without underlying fracture. Electronically Signed   By: Marin Roberts M.D.   On: 03/14/2023 17:54   CT CHEST ABDOMEN PELVIS W CONTRAST Result Date: 03/14/2023 CLINICAL DATA:  Chest trauma, blunt Abdominal trauma, blunt.  Stroke, fall. EXAM: CT CHEST, ABDOMEN, AND PELVIS WITH CONTRAST TECHNIQUE: Multidetector CT imaging of the chest, abdomen and pelvis was performed following the standard protocol during bolus administration of intravenous contrast. RADIATION DOSE REDUCTION: This exam was performed according to the departmental dose-optimization program which includes automated exposure control, adjustment of the mA and/or kV according to patient size and/or use of iterative reconstruction technique. CONTRAST:  75mL OMNIPAQUE IOHEXOL 350 MG/ML SOLN COMPARISON:  CT angio chest 11/18/2022, CT angio chest 03/18/2021 FINDINGS: CHEST: Cardiovascular: No aortic injury. The thoracic aorta is normal in caliber. The heart is normal in size. No significant pericardial effusion. Moderate atherosclerotic plaque. At least 2 vessel coronary artery calcification. The main pulmonary artery is normal in caliber. No central or segmental pulmonary embolus. Limited evaluation more distally due to timing of contrast and motion artifact. Mediastinum/Nodes: No pneumomediastinum. No mediastinal hematoma. The esophagus is unremarkable. The thyroid is unremarkable. The central airways are patent. No mediastinal, hilar, or axillary lymphadenopathy. Lungs/Pleura: Bilateral lower lobe bronchial wall thickening. Bilateral lower lobe mucous plugging, right greater than left. Bibasilar atelectasis. Left lower lobe ground-glass airspace opacity. Stable left lower lobe calcified pulmonary micronodule-no further follow-up indicated. No pulmonary mass. No pulmonary contusion or laceration. No pneumatocele formation. No pleural effusion. No pneumothorax. No hemothorax. Musculoskeletal/Chest wall: No chest wall mass. No acute rib or sternal fracture. No spinal fracture. ABDOMEN / PELVIS: Hepatobiliary: Not enlarged. No focal lesion. No laceration or subcapsular hematoma. Status post cholecystectomy.  No biliary ductal dilatation. Pancreas: Normal pancreatic contour. No  main pancreatic duct dilatation. Spleen: Not enlarged. No focal lesion. No laceration, subcapsular hematoma, or vascular injury.  Adrenals/Urinary Tract: No nodularity bilaterally. Bilateral kidneys enhance and excrete symmetrically. No hydronephrosis. No contusion, laceration, or subcapsular hematoma. No injury to the vascular structures or collecting systems. No hydroureter. The urinary bladder is unremarkable. Stomach/Bowel: No small or large bowel wall thickening or dilatation. Stool throughout the colon. The appendix is unremarkable. Vasculature/Lymphatics: Severe atherosclerotic plaque no abdominal aorta or iliac aneurysm. No active contrast extravasation or pseudoaneurysm. No abdominal, pelvic, inguinal lymphadenopathy. Reproductive: Radiation seeds along the prostate. Other: No simple free fluid ascites. No pneumoperitoneum. No hemoperitoneum. No mesenteric hematoma identified. No organized fluid collection. Musculoskeletal: No significant soft tissue hematoma. No acute pelvic fracture. No spinal fracture. L2-S2 posterolateral interbody surgical hardware fusion. Ports and Devices: None. IMPRESSION: 1. No acute intrathoracic, intra-abdominal, intrapelvic traumatic injury. 2. No acute fracture or traumatic malalignment of the thoracic or lumbar spine. 3. Other imaging findings of potential clinical significance: Bilateral lower lobe bronchial wall thickening with bilateral mucous plugging and patchy ground-glass airspace opacity left lower lobe-question developing infection/inflammation. Stool throughout the colon-correlate for constipation. Aortic Atherosclerosis (ICD10-I70.0)-severe. Electronically Signed   By: Tish Frederickson M.D.   On: 03/14/2023 03:34   DG Chest Port 1 View Result Date: 03/14/2023 CLINICAL DATA:  Recent fall EXAM: PORTABLE CHEST 1 VIEW COMPARISON:  11/01/2022 FINDINGS: Cardiac shadows within normal limits. Aortic calcifications are noted. Skin fold is noted over the right chest.  Minimal left basilar atelectasis is seen. No focal confluent infiltrate is noted. No bony abnormality is noted. IMPRESSION: Mild left basilar atelectasis. Electronically Signed   By: Alcide Clever M.D.   On: 03/14/2023 00:58   CT ANGIO HEAD NECK W WO CM (CODE STROKE) Result Date: 03/14/2023 CLINICAL DATA:  Right arm weakness, right pupil blown, head trauma EXAM: CT ANGIOGRAPHY HEAD AND NECK WITH AND WITHOUT CONTRAST TECHNIQUE: Multidetector CT imaging of the head and neck was performed using the standard protocol during bolus administration of intravenous contrast. Multiplanar CT image reconstructions and MIPs were obtained to evaluate the vascular anatomy. Carotid stenosis measurements (when applicable) are obtained utilizing NASCET criteria, using the distal internal carotid diameter as the denominator. RADIATION DOSE REDUCTION: This exam was performed according to the departmental dose-optimization program which includes automated exposure control, adjustment of the mA and/or kV according to patient size and/or use of iterative reconstruction technique. CONTRAST:  75mL OMNIPAQUE IOHEXOL 350 MG/ML SOLN COMPARISON:  09/13/2022 CTA head and neck, correlation is made with 03/14/2023 CT head FINDINGS: CT HEAD FINDINGS For noncontrast findings, please see same day CT head. CTA NECK FINDINGS Aortic arch: Standard branching. Imaged portion shows no evidence of aneurysm or dissection. No significant stenosis of the major arch vessel origins. Aortic atherosclerosis Right carotid system: No evidence of dissection, occlusion, or hemodynamically significant stenosis (greater than 50%). Left carotid system: No evidence of dissection, occlusion, or hemodynamically significant stenosis (greater than 50%). Vertebral arteries: Moderate stenosis at the origin of the left vertebral artery. Evaluation of the proximal left vertebral artery is limited by beam hardening artifact from the adjacent contrast bolus. The left vertebral  artery is otherwise patent to the skull base. Mild stenosis at the origin of the right vertebral artery. The right vertebral artery is otherwise patent to the skull base. No evidence of dissection. Skeleton: No acute osseous abnormality. Degenerative changes in the cervical spine. Other neck: No acute finding. Upper chest: No focal pulmonary opacity or pleural effusion. Review of the MIP images confirms the above findings CTA HEAD FINDINGS Anterior circulation: Both internal carotid arteries are patent to the  termini, with mild stenosis in the bilateral cavernous segments and moderate to severe stenosis in the proximal supraclinoid segments bilaterally. A1 segments patent. Normal anterior communicating artery. Anterior cerebral arteries are patent to their distal aspects without significant stenosis. No M1 stenosis or occlusion. MCA branches are irregular but perfused to their distal aspects without significant stenosis. Posterior circulation: Vertebral arteries patent to the vertebrobasilar junction, with mild stenosis in the proximal right V4. The left vertebral artery primarily supplies the left PICA, and is quite diminutive after the PICA takeoff. Posterior inferior cerebellar arteries patent proximally. Basilar patent to its distal aspect without significant stenosis. Superior cerebellar arteries patent proximally. 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 Redemonstrated severe stenosis in the proximal left P2 (series 7, images 98-99), with multifocal irregularity in the remainder of the left P2. Evaluation of the more distal PCAs is limited by venous contamination. The bilateral posterior communicating arteries are not definitively seen. Venous sinuses: As permitted by contrast timing, patent. Anatomic variants: None significant. No evidence of aneurysm or vascular malformation.  Review of the MIP images confirms the above findings IMPRESSION: 1. No intracranial large vessel occlusion. 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 2. 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 3. 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 4. Moderate stenosis at the origin of the left vertebral artery and mild stenosis at the origin of the right vertebral artery. 5. Aortic atherosclerosis. Aortic Atherosclerosis (ICD10-I70.0). Electronically Signed   By: Wiliam Ke M.D.   On: 03/14/2023 00:46   CT Cervical Spine Wo Contrast Result Date: 03/14/2023 CLINICAL DATA:  Neck trauma, fell backwards EXAM: CT CERVICAL SPINE WITHOUT CONTRAST TECHNIQUE: Multidetector CT imaging of the cervical spine was performed without intravenous contrast. Multiplanar CT image reconstructions were also generated. RADIATION DOSE REDUCTION: This exam was performed according to the departmental dose-optimization program which includes automated exposure control, adjustment of the mA and/or kV according to patient size and/or use of iterative reconstruction technique. COMPARISON:  02/21/2023 CT cervical spine FINDINGS: Alignment: No traumatic listhesis. Straightening of the normal cervical lordosis. Trace stepwise anterolisthesis of C3 on C4 and C4 on C5. Skull base and vertebrae: No acute fracture. No primary bone lesion or focal pathologic process. Soft tissues and spinal canal: No prevertebral fluid or swelling. No visible canal hematoma. Disc levels: Degenerative changes in the cervical spine. No high-grade spinal canal stenosis. Upper chest: Negative. IMPRESSION: No acute fracture or traumatic listhesis in the cervical spine. Electronically Signed   By: Wiliam Ke M.D.   On: 03/14/2023 00:34   CT HEAD CODE  STROKE WO CONTRAST Result Date: 03/14/2023 CLINICAL DATA:  Code stroke. Right arm weakness, right pupil blown, trauma to posterior head after fall EXAM: CT HEAD WITHOUT CONTRAST TECHNIQUE: Contiguous axial images were obtained from the base of the skull through the vertex without intravenous contrast. RADIATION DOSE REDUCTION: This exam was performed according to the departmental dose-optimization program which includes automated exposure control, adjustment of the mA and/or kV according to patient size and/or use of iterative reconstruction technique. COMPARISON:  02/21/2023 CT head FINDINGS: Brain: No evidence of acute infarction, hemorrhage, mass, mass effect, or midline shift. No hydrocephalus or extra-axial collection. Basal ganglia  calcifications. Normal cerebral volume. Vascular: No hyperdense vessel. Atherosclerotic calcifications in the intracranial carotid and vertebral arteries. Skull: Negative for fracture or focal lesion. Left occipital scalp hematoma. Sinuses/Orbits: No acute finding. Other: The mastoid air cells are well aerated. ASPECTS [facility] Stroke Program Early CT Score) - Ganglionic level infarction (caudate, lentiform nuclei, internal capsule, insula, M1-M3 cortex): 7 - Supraganglionic infarction (M4-M6 cortex): 3 Total score (0-10 with 10 being normal): 10 IMPRESSION: 1. No acute intracranial process. ASPECTS is 10. 2. Left occipital scalp hematoma. Imaging results were communicated on 03/14/2023 at 12:21 am to provider Dr. Wilford Corner via secure text paging. Electronically Signed   By: Wiliam Ke M.D.   On: 03/14/2023 00:21   CT Cervical Spine Wo Contrast Result Date: 02/21/2023 CLINICAL DATA:  82 year old male status post fall yesterday, legs gave out. Abrasions. Blurred vision. EXAM: CT CERVICAL SPINE WITHOUT CONTRAST TECHNIQUE: Multidetector CT imaging of the cervical spine was performed without intravenous contrast. Multiplanar CT image reconstructions were also generated. RADIATION  DOSE REDUCTION: This exam was performed according to the departmental dose-optimization program which includes automated exposure control, adjustment of the mA and/or kV according to patient size and/or use of iterative reconstruction technique. COMPARISON:  CT head and face today.  Cervical spine CT 04/06/2022. FINDINGS: Alignment: Chronic straightening but mildly increased reversal of cervical lordosis now. Cervicothoracic junction alignment is within normal limits. Mild chronic degenerative appearing anterolisthesis at both C3-C4 and C4-C5 with chronic facet degeneration. Bilateral posterior element alignment is within normal limits. Skull base and vertebrae: Bone mineralization is within normal limits. Visualized skull base is intact. No atlanto-occipital dissociation. C1 and C2 appear intact and aligned. No acute osseous abnormality identified. Soft tissues and spinal canal: No prevertebral fluid or swelling. No visible canal hematoma. Negative visible noncontrast neck soft tissues aside from carotid atherosclerosis. 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 Upper chest: Negative. IMPRESSION: 1. No acute traumatic injury identified in the cervical spine. 2. 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 Electronically Signed   By: Odessa Fleming M.D.   On: 02/21/2023 09:15   CT Maxillofacial Wo Contrast Result Date: 02/21/2023 CLINICAL DATA:  82 year old male status post fall yesterday, legs gave out. Abrasions. Blurred vision. EXAM: CT MAXILLOFACIAL WITHOUT CONTRAST TECHNIQUE: Multidetector CT imaging of the maxillofacial structures was performed. Multiplanar CT image reconstructions were also generated. RADIATION DOSE REDUCTION: This exam was performed according to the departmental dose-optimization program which includes automated  exposure control, adjustment of the mA and/or kV according to patient size and/or use of iterative reconstruction technique. COMPARISON:  CT head and cervical spine today. Prior face CT 11/02/2022. FINDINGS: Osseous: Mandible intact and normally located. Stable dentition. Bilateral maxilla, zygoma, pterygoid and nasal bones appear stable and intact. Central skull base appears intact. Orbits: No acute orbital wall fracture. Chronic postoperative changes to the globes. No acute orbit soft tissue finding. Sinuses: Visualized paranasal sinuses and mastoids are stable and well aerated. Soft tissues: Negative visible noncontrast larynx, pharynx, parapharyngeal spaces, retropharyngeal space, sublingual space. Partial submandibular gland atrophy is stable. Masticator and parotid spaces appear negative. Calcified atherosclerosis. No soft tissue gas or superficial soft tissue injury identified other than the right forehead reported separately. Limited intracranial: Stable to that reported separately. IMPRESSION: No acute traumatic injury identified in the Face. Electronically Signed   By: Odessa Fleming M.D.   On: 02/21/2023 09:13   CT Head Wo Contrast Result Date:  02/21/2023 CLINICAL DATA:  82 year old male status post fall yesterday, legs gave out. Abrasions. Blurred vision. EXAM: CT HEAD WITHOUT CONTRAST TECHNIQUE: Contiguous axial images were obtained from the base of the skull through the vertex without intravenous contrast. RADIATION DOSE REDUCTION: This exam was performed according to the departmental dose-optimization program which includes automated exposure control, adjustment of the mA and/or kV according to patient size and/or use of iterative reconstruction technique. COMPARISON:  Brain MRI 01/18/2023 and earlier. Face and cervical spine CT today reported separately. FINDINGS: Brain: Stable cerebral volume. Stable basal ganglia vascular calcifications. No midline shift, mass effect, No midline shift,  ventriculomegaly, mass effect, evidence of mass lesion, intracranial hemorrhage or evidence of cortically based acute infarction. Stable gray-white matter differentiation throughout the brain. Mild for age white matter hypodensity. Vascular: Calcified atherosclerosis at the skull base. No suspicious intracranial vascular hyperdensity. Skull: Stable.  No fracture identified. Sinuses/Orbits: Visualized paranasal sinuses and mastoids are stable and well aerated. Other: Mild right forehead scalp hematoma or contusion series 3, image 18. Underlying right frontal bone intact. No scalp soft tissue gas. IMPRESSION: 1. Mild right forehead scalp hematoma or contusion. No skull fracture. 2. No acute intracranial abnormality. Stable mild for age chronic cerebral white matter changes. Electronically Signed   By: Odessa Fleming M.D.   On: 02/21/2023 09:09     PHYSICAL EXAM  Temp:  [97.5 F (36.4 C)-98.6 F (37 C)] 97.7 F (36.5 C) (12/30 0800) Pulse Rate:  [70-99] 87 (12/30 0900) Resp:  [11-29] 24 (12/30 0900) BP: (95-151)/(51-82) 132/54 (12/30 0900) SpO2:  [81 %-100 %] 98 % (12/30 0800)  General - Well nourished, well developed, in no apparent distress.  Ophthalmologic - fundi not visualized due to noncooperation.  Cardiovascular - Regular rhythm and rate.  Mental Status -  Level of arousal and orientation to time, place, and person were intact. Language including expression, naming, repetition, comprehension was assessed and found intact. Fund of Knowledge was assessed and was intact.  Cranial Nerves II - XII - II - Visual field intact OU. III, IV, VI - Extraocular movements intact. V - Facial sensation intact bilaterally. VII - Facial movement intact bilaterally. VIII - Hearing & vestibular intact bilaterally. X - Palate elevates symmetrically. XI - Chin turning & shoulder shrug intact bilaterally. XII - Tongue protrusion intact.  Motor Strength - The patient's strength was normal in all  extremities and pronator drift was absent.  Bulk was normal and fasciculations were absent.   Motor Tone - Muscle tone was assessed at the neck and appendages and was normal.  Reflexes - The patient's reflexes were symmetrical in all extremities and he had no pathological reflexes.  Sensory - Light touch, temperature/pinprick were assessed and were symmetrical.    Coordination - right FTN ataxic and HTS mild dysmetria. Left FTN and HTS grossly intact.  Tremor was absent.  Gait and Station - deferred.   ASSESSMENT/PLAN Mr. Harry Zamora is a 81 y.o. male with history of HTN, HLD, DM, prostate Ca, LBP admitted for fell at home, right sided weakness ataxia. TNK was given.    Stroke:  ? MRI negative posterior infarct secondary to small vessel disease source vs. Severe orthostatic hypotension CT no acute finding CTA head and neck b/l P1/P2 severe stenosis. 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 Left VA origin moderate stenosis MRI no acute infarct 2D Echo  pending LDL 19 HgbA1c pending SCDs for VTE prophylaxis clopidogrel 75 mg daily prior to admission, now on No antithrombotic  now given acute anemia. May consider antiplatelet once Hb stabilized. Ongoing aggressive stroke risk factor management Therapy recommendations:  outpt Disposition:  pending  Hx of hypertension Orthostatic hypotension Stable on the low end Orthostatic vitals - supine 122/59, sitting 85/49. standing 66/49 but asymptomaticd Encourage po intake Put on TED hose Now on midodrine 5mg  tid from 8am to 5pm Off IVF BP goal < 180/105 Long term BP goal normotensive  Acute anemia  Scalp laceration with bleeding, now stopped Fall with scalp laceration -> bleeding after TNK -> now stopped Hb 11.1->8.0->8.1 CT C/A/P x 2 - No acute intrathoracic, intra-abdominal, intrapelvic traumatic injury. No pelvic sidewall or retroperitoneal hematoma. Asymmetry of the gluteus musculature inferiorly with asymmetric enlargement on the  left - no hematoma on exam Continue CBC monitoring  Diabetes HgbA1c pending goal < 7.0 Controlled CBG monitoring SSI DM education and close PCP follow up  Hyperlipidemia Home meds:  crestor 40  LDL 19, goal < 70 Now decreased to crestor 20 Continue statin at discharge  Other Stroke Risk Factors Advanced age  Other Active Problems LBP Prostate cancer  Hospital day # 1  This patient is critically ill due to stroke s/p TNK, acute anemia and at significant risk of neurological worsening, death form recurrent stroke, hemorrhagic conversion, bleeding from TNK, severe anemia. This patient's care requires constant monitoring of vital signs, hemodynamics, respiratory and cardiac monitoring, review of multiple databases, neurological assessment, discussion with family, other specialists and medical decision making of high complexity. I spent 30 minutes of neurocritical care time in the care of this patient.   Marvel Plan, MD PhD Stroke Neurology 03/15/2023 10:36 AM    To contact Stroke Continuity provider, please refer to [email or web address]. After hours, contact General Neurology

## 2023-03-15 NOTE — Progress Notes (Signed)
Patient's 0300 BP had systolic <100 at 16/10 MAP 67. Patient had awoken with no change in NIH or other vital signs.   Neurology contacted and 1,069mL bolus over 2 hours given.  Orders reflect contact with Neurology and Bolus order.  Delcie Roch, RN

## 2023-03-15 NOTE — Progress Notes (Addendum)
Physical Therapy Treatment Patient Details Name: Harry Zamora MRN: [id number] DOB: May 12, 1940 Today's Date: 03/15/2023   History of Present Illness 82 y.o. male presents to [facility] hospital on 03/14/2023 after a fall and with R weakness. Pt received TNK. CT negative, MRI pending. PMH includes OA, depression, HTN, HLD, prostate cancer, DMII.    PT Comments  Pt orthostatic with transition from sitting to standing, however is asymptomatic (see vitals flowsheet). TED hose donned. Pt is A&Ox4, however, has decreased awareness of urine and stool incontinence during session. Pt states he is continent at baseline. Pt ambulating hallway distances with CGA-min assist for balance and stability (typically uses Rollator). Pt states he has 24/7 assist, which is recommended. Also updated d/c plan to HHPT for home safety evaluation due to questionable cognitive deficit in today's assessment in comparison to yesterday.   If plan is discharge home, recommend the following: A little help with walking and/or transfers;A little help with bathing/dressing/bathroom;Assistance with cooking/housework;Supervision due to cognitive status;Help with stairs or ramp for entrance;Assist for transportation   Can travel by private vehicle        Equipment Recommendations  BSC/3in1    Recommendations for Other Services       Precautions / Restrictions Precautions Precautions: Fall Precaution Comments: history of many falls Restrictions Weight Bearing Restrictions Per Provider Order: No     Mobility  Bed Mobility Overal bed mobility: Modified Independent                  Transfers Overall transfer level: Needs assistance Equipment used: None Transfers: Sit to/from Stand Sit to Stand: Contact guard assist                Ambulation/Gait Ambulation/Gait assistance: Contact guard assist, Min assist Gait Distance (Feet): 200 Feet Assistive device: None Gait Pattern/deviations: Step-through pattern,  Wide base of support, Decreased stride length Gait velocity: reduced Gait velocity interpretation: <1.8 ft/sec, indicate of risk for recurrent falls   General Gait Details: Steadying assist provided (pt typically uses Rollator)   Stairs             Wheelchair Mobility     Tilt Bed    Modified Rankin (Stroke Patients Only) Modified Rankin (Stroke Patients Only) Pre-Morbid Rankin Score: Moderate disability Modified Rankin: Moderately severe disability     Balance Overall balance assessment: Needs assistance Sitting-balance support: No upper extremity supported, Feet supported Sitting balance-Leahy Scale: Good     Standing balance support: Single extremity supported, Reliant on assistive device for balance Standing balance-Leahy Scale: Fair                              Cognition Arousal: Alert Behavior During Therapy: Flat affect Overall Cognitive Status: Impaired/Different from baseline Area of Impairment: Safety/judgement, Awareness                         Safety/Judgement: Decreased awareness of deficits Awareness: Emergent   General Comments: Pt A&Ox4, flat affect and not very communicative with therapist. Answers questions and follows commands approriately, however, with urine and bowel incontinence without awareness or notifying therapist. Pt flipping through shows on remote while therapist attempting to change gown with stool.        Exercises      General Comments        Pertinent Vitals/Pain Pain Assessment Pain Assessment: No/denies pain    Home Living  Prior Function            PT Goals (current goals can now be found in the care plan section) Acute Rehab PT Goals Patient Stated Goal: to stop falling PT Goal Formulation: With patient Time For Goal Achievement: 03/28/23 Potential to Achieve Goals: Fair Progress towards PT goals: Progressing toward goals    Frequency    Min  1X/week      PT Plan      Co-evaluation              AM-PAC PT "6 Clicks" Mobility   Outcome Measure  Help needed turning from your back to your side while in a flat bed without using bedrails?: None Help needed moving from lying on your back to sitting on the side of a flat bed without using bedrails?: None Help needed moving to and from a bed to a chair (including a wheelchair)?: A Little Help needed standing up from a chair using your arms (e.g., wheelchair or bedside chair)?: A Little Help needed to walk in hospital room?: A Little Help needed climbing 3-5 steps with a railing? : A Little 6 Click Score: 20    End of Session Equipment Utilized During Treatment: Gait belt Activity Tolerance: Patient tolerated treatment well Patient left: in chair;with call bell/phone within reach;with chair alarm set Nurse Communication: Mobility status PT Visit Diagnosis: Other abnormalities of gait and mobility (R26.89);Muscle weakness (generalized) (M62.81);History of falling (Z91.81)     Time: 9604-5409 PT Time Calculation (min) (ACUTE ONLY): 34 min  Charges:    $Therapeutic Activity: 23-37 mins PT General Charges $$ ACUTE PT VISIT: 1 Visit                     Lillia Pauls, PT, DPT Acute Rehabilitation Services Office [phone]    Norval Morton 03/15/2023, 2:19 PM

## 2023-03-15 NOTE — Progress Notes (Signed)
OT Cancellation Note  Patient Details Name: Harry Zamora MRN: [id number] DOB: June 09, 1940   Cancelled Treatment:    Reason Eval/Treat Not Completed: Other (comment) (pending new ted hose - orthostatic will hold until able to manage BP during evaluation with OT)  Mateo Flow 03/15/2023, 2:14 PM

## 2023-03-15 NOTE — Plan of Care (Addendum)
Overnight on-call neurology note  Was hypotensive earlier in the night.  Given a liter bolus.  Labs this morning with hemoglobin of 8.  Previous hemoglobin 11.  No signs of active bleeding.  +tenderness abdomen per RN.  CT chest/abd/pelvis on presentation was negative for bleed, but he has receive TNK since and has a Hgb drop.  Will repeat CT abd.  Repeat H&H at 8:30 AM  Will not start ASA  Transfuse if hemoglobin less than 7.  -- Milon Dikes, MD Neurologist [facility]

## 2023-03-15 NOTE — TOC CM/SW Note (Signed)
Transition of Care [facility]) - Inpatient Brief Assessment   Patient Details  Name: Harry Zamora MRN: [id number] Date of Birth: 1940-08-26  Transition of Care [facility]) CM/SW Contact:    Mearl Latin, LCSW Phone Number: 03/15/2023, 3:52 PM   Clinical Narrative: Patient admitted from home with spouse and typically uses a rollator for ambulation. TOC following for home health recommendation.    Transition of Care Asessment: Insurance and Status: Insurance coverage has been reviewed Patient has primary care physician: Yes Home environment has been reviewed: From home Prior level of function:: Mod independent Prior/Current Home Services: No current home services Social Drivers of Health Review: SDOH reviewed no interventions necessary Readmission risk has been reviewed: Yes Transition of care needs: transition of care needs identified, TOC will continue to follow

## 2023-03-16 DIAGNOSIS — I639 Cerebral infarction, unspecified: Secondary | ICD-10-CM | POA: Diagnosis not present

## 2023-03-16 LAB — BASIC METABOLIC PANEL
Anion gap: 10 (ref 5–15)
BUN: 33 mg/dL — ABNORMAL HIGH (ref 8–23)
CO2: 23 mmol/L (ref 22–32)
Calcium: 8.3 mg/dL — ABNORMAL LOW (ref 8.9–10.3)
Chloride: 101 mmol/L (ref 98–111)
Creatinine, Ser: 1.94 mg/dL — ABNORMAL HIGH (ref 0.61–1.24)
GFR, Estimated: 34 mL/min — ABNORMAL LOW (ref >60)
Glucose, Bld: 134 mg/dL — ABNORMAL HIGH (ref 70–99)
Potassium: 3.9 mmol/L (ref 3.5–5.1)
Sodium: 134 mmol/L — ABNORMAL LOW (ref 135–145)

## 2023-03-16 LAB — CBC
HCT: 25.3 % — ABNORMAL LOW (ref 39.0–52.0)
Hemoglobin: 8.7 g/dL — ABNORMAL LOW (ref 13.0–17.0)
MCH: 32 pg (ref 26.0–34.0)
MCHC: 34.4 g/dL (ref 30.0–36.0)
MCV: 93 fL (ref 80.0–100.0)
Platelets: 156 10*3/uL (ref 150–400)
RBC: 2.72 MIL/uL — ABNORMAL LOW (ref 4.22–5.81)
RDW: 12.8 % (ref 11.5–15.5)
WBC: 5.7 10*3/uL (ref 4.0–10.5)
nRBC: 0 % (ref 0.0–0.2)

## 2023-03-16 LAB — GLUCOSE, CAPILLARY
Glucose-Capillary: 148 mg/dL — ABNORMAL HIGH (ref 70–99)
Glucose-Capillary: 151 mg/dL — ABNORMAL HIGH (ref 70–99)
Glucose-Capillary: 145 mg/dL — ABNORMAL HIGH (ref 70–99)
Glucose-Capillary: 143 mg/dL — ABNORMAL HIGH (ref 70–99)

## 2023-03-16 LAB — HEMOGLOBIN A1C
Hgb A1c MFr Bld: 7.5 % — ABNORMAL HIGH (ref 4.8–5.6)
Mean Plasma Glucose: 169 mg/dL

## 2023-03-16 MED ORDER — IPRATROPIUM-ALBUTEROL 0.5-2.5 (3) MG/3ML IN SOLN
3.0000 mL | Freq: Four times a day (QID) | RESPIRATORY_TRACT | Status: DC | PRN
  Administered 2023-03-16: 3 mL via RESPIRATORY_TRACT
  Filled 2023-03-16: qty 3

## 2023-03-16 MED ORDER — INSULIN ASPART 100 UNIT/ML IJ SOLN
0.0000 [IU] | Freq: Three times a day (TID) | INTRAMUSCULAR | Status: DC
  Administered 2023-03-16: 2 [IU] via SUBCUTANEOUS
  Administered 2023-03-16 (×2): 1 [IU] via SUBCUTANEOUS
  Administered 2023-03-17 – 2023-03-18 (×5): 2 [IU] via SUBCUTANEOUS
  Administered 2023-03-18: 1 [IU] via SUBCUTANEOUS
  Administered 2023-03-19 (×2): 2 [IU] via SUBCUTANEOUS
  Administered 2023-03-20: 1 [IU] via SUBCUTANEOUS
  Administered 2023-03-20: 3 [IU] via SUBCUTANEOUS
  Administered 2023-03-20 – 2023-03-21 (×2): 2 [IU] via SUBCUTANEOUS
  Administered 2023-03-21: 1 [IU] via SUBCUTANEOUS
  Administered 2023-03-21: 2 [IU] via SUBCUTANEOUS
  Administered 2023-03-22: 1 [IU] via SUBCUTANEOUS
  Administered 2023-03-22 (×2): 2 [IU] via SUBCUTANEOUS
  Administered 2023-03-23: 3 [IU] via SUBCUTANEOUS
  Administered 2023-03-23: 2 [IU] via SUBCUTANEOUS

## 2023-03-16 NOTE — Progress Notes (Addendum)
 STROKE TEAM PROGRESS NOTE   SUBJECTIVE (INTERVAL HISTORY) Patient sitting up in bed.  No acute events overnight.  Patient with decreased left arm fine motor movements.  MRI negative.  Patient does endorse a fall at the time of his symptoms.  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  No need for antithrombotic therapy as MRI is negative and patient has acute anemia.  Possible discharge tomorrow, PT/OT evaluations for CIR versus home.    OBJECTIVE Temp:  [97.8 F (36.6 C)-100 F (37.8 C)] 98.2 F (36.8 C) (12/31 1248) Pulse Rate:  [76-101] 98 (12/31 1248) Cardiac Rhythm: Sinus tachycardia (12/31 0700) Resp:  [18-21] 19 (12/31 1248) BP: (129-155)/(56-69) 155/65 (12/31 1248) SpO2:  [91 %-96 %] 91 % (12/31 1248)  Recent Labs  Lab 03/15/23 1133 03/15/23 1547 03/15/23 2156 03/16/23 0643 03/16/23 1236  GLUCAP 146* 146* 197* 148* 151*   Recent Labs  Lab 03/14/23 0010 03/14/23 0015 03/15/23 0452 03/16/23 0903  NA 136 137 134* 134*  K 3.8 4.3 3.9 3.9  CL 102 101 101 101  CO2 25  --  23 23  GLUCOSE 74 74 99 134*  BUN 19 30* 34* 33*  CREATININE 1.34* 1.30* 1.88* 1.94*  CALCIUM  9.1  --  7.9* 8.3*   Recent Labs  Lab 03/14/23 0010  AST 35  ALT 20  ALKPHOS 64  BILITOT 1.4*  PROT 6.4*  ALBUMIN  3.3*   Recent Labs  Lab 03/14/23 0010 03/14/23 0015 03/15/23 0452 03/15/23 0751 03/16/23 0903  WBC 13.7*  --  6.6  --  5.7  NEUTROABS 10.3*  --   --   --   --   HGB 11.1* 11.2* 8.0* 8.1* 8.7*  HCT 34.4* 33.0* 24.4* 24.5* 25.3*  MCV 96.6  --  94.2  --  93.0  PLT 188  --  136*  --  156   No results for input(s): CKTOTAL, CKMB, CKMBINDEX, TROPONINI in the last 168 hours. Recent Labs    03/14/23 0010  LABPROT 15.2  INR 1.2   No results for input(s): COLORURINE, LABSPEC, PHURINE, GLUCOSEU, HGBUR, BILIRUBINUR, KETONESUR, PROTEINUR, UROBILINOGEN, NITRITE, LEUKOCYTESUR in the last 72  hours.  Invalid input(s): APPERANCEUR     Component Value Date/Time   CHOL 61 03/14/2023 0405   CHOL 137 10/03/2020 1138   TRIG 47 03/14/2023 0405   HDL 33 (L) 03/14/2023 0405   HDL 33 (L) 10/03/2020 1138   CHOLHDL 1.8 03/14/2023 0405   VLDL 9 03/14/2023 0405   LDLCALC 19 03/14/2023 0405   LDLCALC 82 10/03/2020 1138   Lab Results  Component Value Date   HGBA1C 7.5 (H) 03/15/2023   No results found for: LABOPIA, COCAINSCRNUR, LABBENZ, AMPHETMU, THCU, LABBARB  Recent Labs  Lab 03/14/23 0010  ETH <10    I have personally reviewed the radiological images below and agree with the radiology interpretations.  ECHOCARDIOGRAM COMPLETE Result Date: 03/15/2023    ECHOCARDIOGRAM REPORT   Patient Name:   Harry Zamora Date of Exam: 03/15/2023 Medical Rec #:  [id number]      Height:       74.0 in Accession #:    [id number]     Weight:       148.6 lb Date of Birth:  1940/04/27       BSA:          1.916 m Patient Age:    82 years       BP:  116/51 mmHg Patient Gender: M              HR:           90 bpm. Exam Location:  Inpatient Procedure: 2D Echo, 3D Echo, Cardiac Doppler, Color Doppler and Strain Analysis Indications:    Stroke  History:        Patient has no prior history of Echocardiogram examinations.                 Risk Factors:Hypertension, Diabetes, Dyslipidemia and Current                 Smoker.  Sonographer:    Ozell Free Referring Phys: [id number] ASHISH ARORA  Sonographer Comments: Global longitudinal strain was attempted. IMPRESSIONS  1. Left ventricular ejection fraction, by estimation, is 55 to 60%. The left ventricle has normal function. The left ventricle has no regional wall motion abnormalities. Left ventricular diastolic parameters were normal.  2. Right ventricular systolic function is normal. The right ventricular size is normal. There is moderately elevated pulmonary artery systolic pressure.  3. The mitral valve is normal in structure. No evidence of  mitral valve regurgitation. No evidence of mitral stenosis.  4. The aortic valve is normal in structure. Aortic valve regurgitation is not visualized. No aortic stenosis is present.  5. The inferior vena cava is dilated in size with >50% respiratory variability, suggesting right atrial pressure of 8 mmHg. FINDINGS  Left Ventricle: Left ventricular ejection fraction, by estimation, is 55 to 60%. The left ventricle has normal function. The left ventricle has no regional wall motion abnormalities. The left ventricular internal cavity size was normal in size. There is  no left ventricular hypertrophy. Left ventricular diastolic parameters were normal. Right Ventricle: The right ventricular size is normal. No increase in right ventricular wall thickness. Right ventricular systolic function is normal. There is moderately elevated pulmonary artery systolic pressure. The tricuspid regurgitant velocity is 2.94 m/s, and with an assumed right atrial pressure of 15 mmHg, the estimated right ventricular systolic pressure is 49.6 mmHg. Left Atrium: Left atrial size was normal in size. Right Atrium: Right atrial size was normal in size. Pericardium: There is no evidence of pericardial effusion. Mitral Valve: The mitral valve is normal in structure. No evidence of mitral valve regurgitation. No evidence of mitral valve stenosis. Tricuspid Valve: The tricuspid valve is normal in structure. Tricuspid valve regurgitation is trivial. No evidence of tricuspid stenosis. Aortic Valve: The aortic valve is normal in structure. Aortic valve regurgitation is not visualized. No aortic stenosis is present. Aortic valve mean gradient measures 5.0 mmHg. Aortic valve peak gradient measures 9.5 mmHg. Aortic valve area, by VTI measures 2.57 cm. Pulmonic Valve: The pulmonic valve was normal in structure. Pulmonic valve regurgitation is not visualized. No evidence of pulmonic stenosis. Aorta: The aortic root is normal in size and structure. Venous:  The inferior vena cava is dilated in size with greater than 50% respiratory variability, suggesting right atrial pressure of 8 mmHg. IAS/Shunts: No atrial level shunt detected by color flow Doppler.  LEFT VENTRICLE PLAX 2D LVIDd:         4.40 cm   Diastology LVIDs:         2.80 cm   LV e' medial:    8.92 cm/s LV PW:         1.00 cm   LV E/e' medial:  8.4 LV IVS:        1.00 cm   LV e' lateral:  9.57 cm/s LVOT diam:     2.20 cm   LV E/e' lateral: 7.8 LV SV:         68 LV SV Index:   36 LVOT Area:     3.80 cm                           3D Volume EF:                          3D EF:        53 %                          LV EDV:       150 ml                          LV ESV:       70 ml                          LV SV:        80 ml RIGHT VENTRICLE             IVC RV Basal diam:  4.40 cm     IVC diam: 2.30 cm RV S prime:     15.30 cm/s TAPSE (M-mode): 3.4 cm LEFT ATRIUM           Index        RIGHT ATRIUM           Index LA diam:      3.40 cm 1.77 cm/m   RA Area:     17.80 cm LA Vol (A2C): 59.8 ml 31.22 ml/m  RA Volume:   50.70 ml  26.47 ml/m LA Vol (A4C): 55.8 ml 29.13 ml/m  AORTIC VALVE AV Area (Vmax):    2.24 cm AV Area (Vmean):   2.24 cm AV Area (VTI):     2.57 cm AV Vmax:           154.00 cm/s AV Vmean:          106.000 cm/s AV VTI:            0.265 m AV Peak Grad:      9.5 mmHg AV Mean Grad:      5.0 mmHg LVOT Vmax:         90.70 cm/s LVOT Vmean:        62.400 cm/s LVOT VTI:          0.179 m LVOT/AV VTI ratio: 0.68  AORTA Ao Root diam: 3.60 cm Ao Asc diam:  3.00 cm MITRAL VALVE               TRICUSPID VALVE MV Area (PHT): 3.68 cm    TR Peak grad:   34.6 mmHg MV Decel Time: 206 msec    TR Vmax:        294.00 cm/s MV E velocity: 74.90 cm/s MV A velocity: 94.30 cm/s  SHUNTS MV E/A ratio:  0.79        Systemic VTI:  0.18 m                            Systemic Diam: 2.20 cm Aditya Sabharwal Electronically signed by Ria Commander Signature Date/Time: 03/15/2023/10:55:31 AM  Final    CT ABDOMEN PELVIS WO  CONTRAST Result Date: 03/15/2023 CLINICAL DATA:  Abdominal pain. Acute decrease in hemoglobin with tender abdomen. Evaluate for hematoma EXAM: CT ABDOMEN AND PELVIS WITHOUT CONTRAST TECHNIQUE: Multidetector CT imaging of the abdomen and pelvis was performed following the standard protocol without IV contrast. RADIATION DOSE REDUCTION: This exam was performed according to the departmental dose-optimization program which includes automated exposure control, adjustment of the mA and/or kV according to patient size and/or use of iterative reconstruction technique. COMPARISON:  03/14/2023 FINDINGS: Lower chest: Dependent collapse/consolidation in both lower lobes is progressive in the interval with areas of peripheral airway impaction in the dependent lower lungs. Hepatobiliary: No suspicious focal abnormality in the liver on this study without intravenous contrast. Cholecystectomy. No intrahepatic or extrahepatic biliary dilation. Pancreas: No focal mass lesion. No dilatation of the main duct. No intraparenchymal cyst. Diffuse atrophy. Spleen: No splenomegaly. No suspicious focal mass lesion. Adrenals/Urinary Tract: No adrenal nodule or mass. Small nonobstructing stones are evident in both kidneys. High attenuation in the renal pelvis of each kidney in the ureters and the bladder lumen is compatible with excreted contrast from yesterday's CT scan. No hydroureteronephrosis. Bladder lumen opacified but otherwise unremarkable. Stomach/Bowel: Stomach is unremarkable. No gastric wall thickening. No evidence of outlet obstruction. Duodenum is normally positioned as is the ligament of Treitz. No small bowel wall thickening. No small bowel dilatation. The terminal ileum is normal. The appendix is normal. The tip of the appendix appears to be filled with soft tissue (51/3) without distension. This finding is stable comparing back to a CT from 03/24/2021 but appears minimally progressive when comparing to 09/12/2020. No gross  colonic mass. No colonic wall thickening. Moderate to large stool volume evident. Vascular/Lymphatic: There is moderate atherosclerotic calcification of the abdominal aorta without aneurysm. There is no gastrohepatic or hepatoduodenal ligament lymphadenopathy. No retroperitoneal or mesenteric lymphadenopathy. No pelvic sidewall lymphadenopathy. 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 Other: No intraperitoneal free fluid. Musculoskeletal: No pelvic sidewall or retroperitoneal hematoma. There is some minimal thickening of the right posterior renal fascia, stable to minimally progressive since 03/14/2023, but this is only minimal and is nonspecific, not representing a discrete hematoma or substantial hemorrhage. Lumbar fusion hardware evident. The patient is noted to have asymmetry of the gluteus musculature inferiorly with asymmetric enlargement on the left although this is stable to minimally increased compared to 03/14/2023 and has been incompletely visualized. IMPRESSION: 1. No pelvic sidewall or retroperitoneal hematoma. There is some minimal thickening of the right posterior renal fascia, stable to minimally progressive since 03/14/2023, but this is only minimal and is nonspecific, not representing a discrete hematoma or substantial hemorrhage. 2. Asymmetry of the gluteus musculature inferiorly with asymmetric enlargement on the left although this is stable to may be minimally increased compared to 03/14/2023 and has been incompletely visualized. There is not a discrete hematoma a discernible on this study but a component of intramuscular hemorrhage could have this appearance. This would likely be amenable to clinical inspection. 3. Dependent collapse/consolidation in both lower lobes is progressive in the interval with areas of peripheral airway impaction in the dependent lower lungs. Imaging features compatible may be infectious although aspiration could have this appearance. 4. The tip  of the appendix appears to be filled with soft tissue without distension. This finding is stable comparing back to a CT from 03/24/2021 but appears minimally progressive when comparing to 09/12/2020. This is a nonspecific finding and may be related to mucocele. Follow-up CT  in 6 months recommended. Non emergent follow-up surgical consultation recommended. 5. Small nonobstructing stones in both kidneys. 6.  Aortic Atherosclerosis (ICD10-I70.0). Electronically Signed   By: Camellia Candle M.D.   On: 03/15/2023 06:24   MR BRAIN WO CONTRAST Result Date: 03/15/2023 CLINICAL DATA:  Stroke/TIA.  Status post T NK. EXAM: MRI HEAD WITHOUT CONTRAST TECHNIQUE: Multiplanar, multiecho pulse sequences of the brain and surrounding structures were obtained without intravenous contrast. COMPARISON:  01/18/2023 FINDINGS: Brain: No acute infarct, mass effect or extra-axial collection. 3-5 chronic microhemorrhages in the cerebellum. There is multifocal hyperintense T2-weighted signal within the white matter. Parenchymal volume and CSF spaces are normal. The midline structures are normal. Vascular: Normal flow voids. Skull and upper cervical spine: Posterior scalp subgaleal collection. Sinuses/Orbits:No paranasal sinus fluid levels or advanced mucosal thickening. No mastoid or middle ear effusion. Normal orbits. IMPRESSION: 1. No acute intracranial abnormality. 2. Findings of chronic small vessel ischemia. 3. Posterior scalp subgaleal hematoma. Electronically Signed   By: Franky Stanford M.D.   On: 03/15/2023 00:01   CT HEAD WO CONTRAST ( ) Result Date: 03/14/2023 CLINICAL DATA:  Stroke, follow-up. Persistent right upper extremity ataxia and right lower extremity dysmetria. Status post TNK. EXAM: CT HEAD WITHOUT CONTRAST TECHNIQUE: Contiguous axial images were obtained from the base of the skull through the vertex without intravenous contrast. RADIATION DOSE REDUCTION: This exam was performed according to the departmental  dose-optimization program which includes automated exposure control, adjustment of the mA and/or kV according to patient size and/or use of iterative reconstruction technique. COMPARISON:  CT head and CT angio head and neck 03/14/2023 at 12:15 a.m. FINDINGS: Brain: Mild atrophy and white matter disease is stable. No acute infarct or hemorrhage is present. Deep brain nuclei are within normal limits. The ventricles are proportionate to the degree of atrophy. No significant extraaxial fluid collection is present. The brainstem and cerebellum are within normal limits. Midline structures are within normal limits. Vascular: Atherosclerotic calcifications are again noted within the cavernous internal carotid arteries bilaterally and at the dural margin of the right vertebral artery. No hyperdense vessel is present. Skull: A left parietal and occipital scalp hematoma is new. No acute fracture is present. Sinuses/Orbits: The paranasal sinuses and mastoid air cells are clear. Bilateral lens replacements are noted. Globes and orbits are otherwise unremarkable. IMPRESSION: 1. No acute intracranial abnormality or significant interval change. 2. Stable atrophy and white matter disease. This likely reflects the sequela of chronic microvascular ischemia. 3. New left parietal and occipital scalp hematoma without underlying fracture. Electronically Signed   By: Lonni Necessary M.D.   On: 03/14/2023 17:54   CT CHEST ABDOMEN PELVIS W CONTRAST Result Date: 03/14/2023 CLINICAL DATA:  Chest trauma, blunt Abdominal trauma, blunt. Stroke, fall. EXAM: CT CHEST, ABDOMEN, AND PELVIS WITH CONTRAST TECHNIQUE: Multidetector CT imaging of the chest, abdomen and pelvis was performed following the standard protocol during bolus administration of intravenous contrast. RADIATION DOSE REDUCTION: This exam was performed according to the departmental dose-optimization program which includes automated exposure control, adjustment of the mA and/or  kV according to patient size and/or use of iterative reconstruction technique. CONTRAST:  75mL OMNIPAQUE  IOHEXOL  350 MG/ML SOLN COMPARISON:  CT angio chest 11/18/2022, CT angio chest 03/18/2021 FINDINGS: CHEST: Cardiovascular: No aortic injury. The thoracic aorta is normal in caliber. The heart is normal in size. No significant pericardial effusion. Moderate atherosclerotic plaque. At least 2 vessel coronary artery calcification. The main pulmonary artery is normal in caliber. No central or segmental pulmonary embolus. Limited  evaluation more distally due to timing of contrast and motion artifact. Mediastinum/Nodes: No pneumomediastinum. No mediastinal hematoma. The esophagus is unremarkable. The thyroid  is unremarkable. The central airways are patent. No mediastinal, hilar, or axillary lymphadenopathy. Lungs/Pleura: Bilateral lower lobe bronchial wall thickening. Bilateral lower lobe mucous plugging, right greater than left. Bibasilar atelectasis. Left lower lobe ground-glass airspace opacity. Stable left lower lobe calcified pulmonary micronodule-no further follow-up indicated. No pulmonary mass. No pulmonary contusion or laceration. No pneumatocele formation. No pleural effusion. No pneumothorax. No hemothorax. Musculoskeletal/Chest wall: No chest wall mass. No acute rib or sternal fracture. No spinal fracture. ABDOMEN / PELVIS: Hepatobiliary: Not enlarged. No focal lesion. No laceration or subcapsular hematoma. Status post cholecystectomy.  No biliary ductal dilatation. Pancreas: Normal pancreatic contour. No main pancreatic duct dilatation. Spleen: Not enlarged. No focal lesion. No laceration, subcapsular hematoma, or vascular injury. Adrenals/Urinary Tract: No nodularity bilaterally. Bilateral kidneys enhance and excrete symmetrically. No hydronephrosis. No contusion, laceration, or subcapsular hematoma. No injury to the vascular structures or collecting systems. No hydroureter. The urinary bladder is  unremarkable. Stomach/Bowel: No small or large bowel wall thickening or dilatation. Stool throughout the colon. The appendix is unremarkable. Vasculature/Lymphatics: Severe atherosclerotic plaque no abdominal aorta or iliac aneurysm. No active contrast extravasation or pseudoaneurysm. No abdominal, pelvic, inguinal lymphadenopathy. Reproductive: Radiation seeds along the prostate. Other: No simple free fluid ascites. No pneumoperitoneum. No hemoperitoneum. No mesenteric hematoma identified. No organized fluid collection. Musculoskeletal: No significant soft tissue hematoma. No acute pelvic fracture. No spinal fracture. L2-S2 posterolateral interbody surgical hardware fusion. Ports and Devices: None. IMPRESSION: 1. No acute intrathoracic, intra-abdominal, intrapelvic traumatic injury. 2. No acute fracture or traumatic malalignment of the thoracic or lumbar spine. 3. Other imaging findings of potential clinical significance: Bilateral lower lobe bronchial wall thickening with bilateral mucous plugging and patchy ground-glass airspace opacity left lower lobe-question developing infection/inflammation. Stool throughout the colon-correlate for constipation. Aortic Atherosclerosis (ICD10-I70.0)-severe. Electronically Signed   By: Morgane  Naveau M.D.   On: 03/14/2023 03:34   DG Chest Port 1 View Result Date: 03/14/2023 CLINICAL DATA:  Recent fall EXAM: PORTABLE CHEST 1 VIEW COMPARISON:  11/01/2022 FINDINGS: Cardiac shadows within normal limits. Aortic calcifications are noted. Skin fold is noted over the right chest. Minimal left basilar atelectasis is seen. No focal confluent infiltrate is noted. No bony abnormality is noted. IMPRESSION: Mild left basilar atelectasis. Electronically Signed   By: Oneil Devonshire M.D.   On: 03/14/2023 00:58   CT ANGIO HEAD NECK W WO CM (CODE STROKE) Result Date: 03/14/2023 CLINICAL DATA:  Right arm weakness, right pupil blown, head trauma EXAM: CT ANGIOGRAPHY HEAD AND NECK WITH AND  WITHOUT CONTRAST TECHNIQUE: Multidetector CT imaging of the head and neck was performed using the standard protocol during bolus administration of intravenous contrast. Multiplanar CT image reconstructions and MIPs were obtained to evaluate the vascular anatomy. Carotid stenosis measurements (when applicable) are obtained utilizing NASCET criteria, using the distal internal carotid diameter as the denominator. RADIATION DOSE REDUCTION: This exam was performed according to the departmental dose-optimization program which includes automated exposure control, adjustment of the mA and/or kV according to patient size and/or use of iterative reconstruction technique. CONTRAST:  75mL OMNIPAQUE  IOHEXOL  350 MG/ML SOLN COMPARISON:  09/13/2022 CTA head and neck, correlation is made with 03/14/2023 CT head FINDINGS: CT HEAD FINDINGS For noncontrast findings, please see same day CT head. CTA NECK FINDINGS Aortic arch: Standard branching. Imaged portion shows no evidence of aneurysm or dissection. No significant stenosis of the major arch  vessel origins. Aortic atherosclerosis Right carotid system: No evidence of dissection, occlusion, or hemodynamically significant stenosis (greater than 50%). Left carotid system: No evidence of dissection, occlusion, or hemodynamically significant stenosis (greater than 50%). Vertebral arteries: Moderate stenosis at the origin of the left vertebral artery. Evaluation of the proximal left vertebral artery is limited by beam hardening artifact from the adjacent contrast bolus. The left vertebral artery is otherwise patent to the skull base. Mild stenosis at the origin of the right vertebral artery. The right vertebral artery is otherwise patent to the skull base. No evidence of dissection. Skeleton: No acute osseous abnormality. Degenerative changes in the cervical spine. Other neck: No acute finding. Upper chest: No focal pulmonary opacity or pleural effusion. Review of the MIP images confirms  the above findings CTA HEAD FINDINGS Anterior circulation: Both internal carotid arteries are patent to the termini, with mild stenosis in the bilateral cavernous segments and moderate to severe stenosis in the proximal supraclinoid segments bilaterally. A1 segments patent. Normal anterior communicating artery. Anterior cerebral arteries are patent to their distal aspects without significant stenosis. No M1 stenosis or occlusion. MCA branches are irregular but perfused to their distal aspects without significant stenosis. Posterior circulation: Vertebral arteries patent to the vertebrobasilar junction, with mild stenosis in the proximal right V4. The left vertebral artery primarily supplies the left PICA, and is quite diminutive after the PICA takeoff. Posterior inferior cerebellar arteries patent proximally. Basilar patent to its distal aspect without significant stenosis. Superior cerebellar arteries patent proximally. 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 Redemonstrated severe stenosis in the proximal left P2 (series 7, images 98-99), with multifocal irregularity in the remainder of the left P2. Evaluation of the more distal PCAs is limited by venous contamination. The bilateral posterior communicating arteries are not definitively seen. Venous sinuses: As permitted by contrast timing, patent. Anatomic variants: None significant. No evidence of aneurysm or vascular malformation. Review of the MIP images confirms the above findings IMPRESSION: 1. No intracranial large vessel occlusion. 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 2. 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 3. Moderate to severe stenosis in the  proximal supraclinoid segments of the bilateral ICAs. 4. Moderate stenosis at the origin of the left vertebral artery and mild stenosis at the origin of the right vertebral artery. 5. Aortic atherosclerosis. Aortic Atherosclerosis (ICD10-I70.0). Electronically Signed   By: Donald Campion M.D.   On: 03/14/2023 00:46   CT Cervical Spine Wo Contrast Result Date: 03/14/2023 CLINICAL DATA:  Neck trauma, fell backwards EXAM: CT CERVICAL SPINE WITHOUT CONTRAST TECHNIQUE: Multidetector CT imaging of the cervical spine was performed without intravenous contrast. Multiplanar CT image reconstructions were also generated. RADIATION DOSE REDUCTION: This exam was performed according to the departmental dose-optimization program which includes automated exposure control, adjustment of the mA and/or kV according to patient size and/or use of iterative reconstruction technique. COMPARISON:  02/21/2023 CT cervical spine FINDINGS: Alignment: No traumatic listhesis. Straightening of the normal cervical lordosis. Trace stepwise anterolisthesis of C3 on C4 and C4 on C5. Skull base and vertebrae: No acute fracture. No primary bone lesion or focal pathologic process. Soft tissues and spinal canal: No prevertebral fluid or swelling. No visible canal hematoma. Disc levels: Degenerative changes in  the cervical spine. No high-grade spinal canal stenosis. Upper chest: Negative. IMPRESSION: No acute fracture or traumatic listhesis in the cervical spine. Electronically Signed   By: Donald Campion M.D.   On: 03/14/2023 00:34   CT HEAD CODE STROKE WO CONTRAST Result Date: 03/14/2023 CLINICAL DATA:  Code stroke. Right arm weakness, right pupil blown, trauma to posterior head after fall EXAM: CT HEAD WITHOUT CONTRAST TECHNIQUE: Contiguous axial images were obtained from the base of the skull through the vertex without intravenous contrast. RADIATION DOSE REDUCTION: This exam was performed according to the departmental dose-optimization program  which includes automated exposure control, adjustment of the mA and/or kV according to patient size and/or use of iterative reconstruction technique. COMPARISON:  02/21/2023 CT head FINDINGS: Brain: No evidence of acute infarction, hemorrhage, mass, mass effect, or midline shift. No hydrocephalus or extra-axial collection. Basal ganglia calcifications. Normal cerebral volume. Vascular: No hyperdense vessel. Atherosclerotic calcifications in the intracranial carotid and vertebral arteries. Skull: Negative for fracture or focal lesion. Left occipital scalp hematoma. Sinuses/Orbits: No acute finding. Other: The mastoid air cells are well aerated. ASPECTS [facility] Stroke Program Early CT Score) - Ganglionic level infarction (caudate, lentiform nuclei, internal capsule, insula, M1-M3 cortex): 7 - Supraganglionic infarction (M4-M6 cortex): 3 Total score (0-10 with 10 being normal): 10 IMPRESSION: 1. No acute intracranial process. ASPECTS is 10. 2. Left occipital scalp hematoma. Imaging results were communicated on 03/14/2023 at 12:21 am to provider Dr. Voncile via secure text paging. Electronically Signed   By: Donald Campion M.D.   On: 03/14/2023 00:21   CT Cervical Spine Wo Contrast Result Date: 02/21/2023 CLINICAL DATA:  82 year old male status post fall yesterday, legs gave out. Abrasions. Blurred vision. EXAM: CT CERVICAL SPINE WITHOUT CONTRAST TECHNIQUE: Multidetector CT imaging of the cervical spine was performed without intravenous contrast. Multiplanar CT image reconstructions were also generated. RADIATION DOSE REDUCTION: This exam was performed according to the departmental dose-optimization program which includes automated exposure control, adjustment of the mA and/or kV according to patient size and/or use of iterative reconstruction technique. COMPARISON:  CT head and face today.  Cervical spine CT 04/06/2022. FINDINGS: Alignment: Chronic straightening but mildly increased reversal of cervical lordosis now.  Cervicothoracic junction alignment is within normal limits. Mild chronic degenerative appearing anterolisthesis at both C3-C4 and C4-C5 with chronic facet degeneration. Bilateral posterior element alignment is within normal limits. Skull base and vertebrae: Bone mineralization is within normal limits. Visualized skull base is intact. No atlanto-occipital dissociation. C1 and C2 appear intact and aligned. No acute osseous abnormality identified. Soft tissues and spinal canal: No prevertebral fluid or swelling. No visible canal hematoma. Negative visible noncontrast neck soft tissues aside from carotid atherosclerosis. 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 Upper chest: Negative. IMPRESSION: 1. No acute traumatic injury identified in the cervical spine. 2. 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 Electronically Signed   By: VEAR Hurst M.D.   On: 02/21/2023 09:15   CT Maxillofacial Wo Contrast Result Date: 02/21/2023 CLINICAL DATA:  82 year old male status post fall yesterday, legs gave out. Abrasions. Blurred vision. EXAM: CT MAXILLOFACIAL WITHOUT CONTRAST TECHNIQUE: Multidetector CT imaging of the maxillofacial structures was performed. Multiplanar CT image reconstructions were also generated. RADIATION DOSE REDUCTION: This exam was performed according to the departmental dose-optimization program which includes automated exposure control, adjustment of the mA and/or kV according to patient size and/or use of iterative reconstruction technique. COMPARISON:  CT head and cervical spine today. Prior face CT 11/02/2022. FINDINGS: Osseous: Mandible intact and normally located. Stable dentition. Bilateral maxilla, zygoma, pterygoid and nasal bones appear stable and intact. Central skull base appears intact. Orbits: No acute orbital wall  fracture. Chronic postoperative changes to the globes. No acute orbit soft tissue finding. Sinuses: Visualized paranasal sinuses and mastoids are stable and well aerated. Soft tissues: Negative visible noncontrast larynx, pharynx, parapharyngeal spaces, retropharyngeal space, sublingual space. Partial submandibular gland atrophy is stable. Masticator and parotid spaces appear negative. Calcified atherosclerosis. No soft tissue gas or superficial soft tissue injury identified other than the right forehead reported separately. Limited intracranial: Stable to that reported separately. IMPRESSION: No acute traumatic injury identified in the Face. Electronically Signed   By: VEAR Hurst M.D.   On: 02/21/2023 09:13   CT Head Wo Contrast Result Date: 02/21/2023 CLINICAL DATA:  82 year old male status post fall yesterday, legs gave out. Abrasions. Blurred vision. EXAM: CT HEAD WITHOUT CONTRAST TECHNIQUE: Contiguous axial images were obtained from the base of the skull through the vertex without intravenous contrast. RADIATION DOSE REDUCTION: This exam was performed according to the departmental dose-optimization program which includes automated exposure control, adjustment of the mA and/or kV according to patient size and/or use of iterative reconstruction technique. COMPARISON:  Brain MRI 01/18/2023 and earlier. Face and cervical spine CT today reported separately. FINDINGS: Brain: Stable cerebral volume. Stable basal ganglia vascular calcifications. No midline shift, mass effect, No midline shift, ventriculomegaly, mass effect, evidence of mass lesion, intracranial hemorrhage or evidence of cortically based acute infarction. Stable gray-white matter differentiation throughout the brain. Mild for age white matter hypodensity. Vascular: Calcified atherosclerosis at the skull base. No suspicious intracranial vascular hyperdensity. Skull: Stable.  No fracture identified. Sinuses/Orbits: Visualized paranasal sinuses and  mastoids are stable and well aerated. Other: Mild right forehead scalp hematoma or contusion series 3, image 18. Underlying right frontal bone intact. No scalp soft tissue gas. IMPRESSION: 1. Mild right forehead scalp hematoma or contusion. No skull fracture. 2. No acute intracranial abnormality. Stable mild for age chronic cerebral white matter changes. Electronically Signed   By: VEAR Hurst M.D.   On: 02/21/2023 09:09     PHYSICAL EXAM  Temp:  [97.8 F (36.6 C)-100 F (37.8 C)] 98.2 F (36.8 C) (12/31 1248) Pulse Rate:  [76-101] 98 (12/31 1248) Resp:  [18-21] 19 (12/31 1248) BP: (129-155)/(56-69) 155/65 (12/31 1248) SpO2:  [91 %-96 %] 91 % (12/31 1248)  General - Well nourished, well developed, in no apparent distress.  Cardiovascular - Regular rhythm and rate.  Mental Status -  Level of arousal and orientation to time, place, and person were intact. Language including expression, naming, repetition, comprehension was assessed and found intact.  Cranial Nerves II - XII - II - VFF (glasses at baseline) III, IV, VI -EOMI V -facial sensation intact bilaterally VII -facial movement symmetrical VIII - Hearing & vestibular intact bilaterally. X - Palate elevates symmetrically.  Phonation intact XI - Chin turning & shoulder shrug intact bilaterally. XII - Tongue protrusion intact.  Motor Strength - The patient's strength was normal in all extremities and pronator drift was absent.  Bulk was normal and fasciculations were absent.    Sensory - Light touch symmetrical.    Coordination - Decreased LUE fine motor movements.  Tremor was absent.  Gait and Station - deferred.   ASSESSMENT/PLAN Mr. Harry Zamora is a 82 y.o. male with history of HTN, HLD, DM, prostate Ca, LBP admitted for  fell at home, right sided weakness ataxia. TNK was given.    Stroke like episode treated with IV TNK: Unwitnessed fall with concussion and transient right-sided weakness not clear if related to syncope  or orthostatic hypotension CT no acute finding CTA head and neck b/l P1/P2 severe stenosis. 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 Left VA origin moderate stenosis MRI no acute infarct 2D Echo: LVEF 55 to 60%, LDL 19 HgbA1c: 7.5 SCDs for VTE prophylaxis clopidogrel  75 mg daily prior to admission, now on No antithrombotic due to negative MRI and acute anemia (improved from yesterday) Ongoing aggressive stroke risk factor management Therapy recommendations:  outpt Disposition:  pending  Hx of hypertension Orthostatic hypotension Stable on the low end Orthostatic vitals - supine 122/59, sitting 85/49. standing 66/49 but asymptomaticd Encourage po intake Continue TED hose Continue midodrine  5mg  tid from 8am to 5pm Off IVF BP goal < 180/105 Long term BP goal normotensive  Acute anemia  Scalp laceration with prior bleeding Fall with scalp laceration -> bleeding after TNK -> now stopped Hb 11.1->8.0->8.1->8.7 CT C/A/P x 2 - No acute intrathoracic, intra-abdominal, intrapelvic traumatic injury. No pelvic sidewall or retroperitoneal hematoma. Asymmetry of the gluteus musculature inferiorly with asymmetric enlargement on the left - no hematoma on exam Continue CBC monitoring  Diabetes HgbA1c 7.5 goal < 7.0 Uncontrolled CBG monitoring SSI DM education and close PCP follow up  Hyperlipidemia Home meds:  crestor  40  LDL 19, goal < 70 Now decreased to crestor  20 Continue statin at discharge  Other Stroke Risk Factors Advanced age  Other Active Problems LBP Prostate cancer  Hospital day # 2   Pt seen by Neuro NP/APP and later by MD. Note/plan to be edited by MD as needed.    Rocky JAYSON Likes, DNP, AGACNP-BC [facility] Please use AMION for contact information & EPIC for messaging.  I have personally obtained history,examined this patient, reviewed notes, independently viewed imaging studies, participated in medical decision making and plan of care.ROS completed  by me personally and pertinent positives fully documented  I have made any additions or clarifications directly to the above note. Agree with note above.  Patient doing well without any focal neurological symptoms.  Continue ongoing therapies and mobilize out of bed.  Discharged home if safe otherwise rehab.  Greater than 50% time during this 35-minute visit was spent in counseling and coordination of care about his unwitnessed fall and transient weakness and discussing results of imaging studies and stroke workup and answering questions.  Eather Popp, MD Medical Director [facility] Pager: [phone] 03/16/2023 3:32 PM    To contact Stroke Continuity provider, please refer to [email or web address]. After hours, contact General Neurology

## 2023-03-16 NOTE — Progress Notes (Signed)
Paged Dr. Iver Nestle that patient is having expiratory wheezing, no PRN respiratory treatment on MAR, placed patient on 2 lpm oxygen for the mean time.

## 2023-03-16 NOTE — Progress Notes (Signed)
 Inpatient Rehab Admissions Coordinator Note:   Per OT recommendations patient was screened for CIR candidacy by Reche FORBES Lowers, PT. At this time, pt appears to be a potential candidate for CIR. I will place an order for rehab consult for full assessment, per our protocol.  Please contact me any with questions.SABRA Reche Lowers, PT, DPT [phone] 03/16/23 2:27 PM

## 2023-03-16 NOTE — Plan of Care (Signed)
Duonebs ordered for wheezing q6hr PRN

## 2023-03-16 NOTE — Plan of Care (Signed)
  Problem: Tissue Perfusion: Goal: Adequacy of tissue perfusion will improve Outcome: Not Progressing   Problem: Skin Integrity: Goal: Risk for impaired skin integrity will decrease Outcome: Not Progressing   Problem: Metabolic: Goal: Ability to maintain appropriate glucose levels will improve Outcome: Not Progressing   Problem: Safety: Goal: Ability to remain free from injury will improve Outcome: Not Progressing   Problem: Pain Management: Goal: General experience of comfort will improve Outcome: Not Progressing

## 2023-03-16 NOTE — Evaluation (Signed)
 Occupational Therapy Evaluation Patient Details Name: Harry Zamora MRN: [id number] DOB: Jul 24, 1940 Today's Date: 03/16/2023   History of Present Illness 82 y.o. male presents to [facility] on 03/14/2023 after a fall and with R weakness. Pt received TNK. CT negative, MRI negative. PMH includes OA, depression, HTN, HLD, prostate cancer, DMII.   Clinical Impression   PT admitted with R side weakness and orthostatic. Pt currently with functional limitiations due to the deficits listed below (see OT problem list). Pt was indep and driving prior to admission. Pt noted to have R side weakness compared to L UE. Pt noted to have drainage to R eye. Pt incontinence of bowel and bladder without awareness and baseline is continent of both. Pt with posterior lean with static standing.  Pt will benefit from skilled OT to increase their independence and safety with adls and balance to allow discharge Patient will benefit from intensive inpatient follow up therapy, >3 hours/day         If plan is discharge home, recommend the following: Two people to help with walking and/or transfers;Two people to help with bathing/dressing/bathroom    Functional Status Assessment  Patient has had a recent decline in their functional status and demonstrates the ability to make significant improvements in function in a reasonable and predictable amount of time.  Equipment Recommendations  Other (comment) (TBA)    Recommendations for Other Services Rehab consult     Precautions / Restrictions Precautions Precautions: Fall Precaution Comments: history of many falls      Mobility Bed Mobility Overal bed mobility: Needs Assistance Bed Mobility: Rolling, Supine to Sit, Sit to Supine Rolling: Min assist, Used rails   Supine to sit: Mod assist, Used rails, HOB elevated Sit to supine: Min assist, Used rails   General bed mobility comments: pt needs (A) to completed bed mobility and step by step cues to sequence  task    Transfers Overall transfer level: Needs assistance Equipment used: 1 person hand held assist Transfers: Sit to/from Stand Sit to Stand: Mod assist           General transfer comment: pt requires (A) to power up into standing and with strong posterior lean. pt requires continued support due to posterior fall risk. pt sitting premature but denies dizziness but slight nausea positive. pt incontinence of stool upon standing and no awarness. recommend brief for all therapy sessions due to incontinence. RN made aware      Balance Overall balance assessment: Needs assistance Sitting-balance support: Bilateral upper extremity supported, Feet supported Sitting balance-Leahy Scale: Fair     Standing balance support: Bilateral upper extremity supported, During functional activity, Reliant on assistive device for balance Standing balance-Leahy Scale: Poor                             ADL either performed or assessed with clinical judgement   ADL                                               Vision Baseline Vision/History: 1 Wears glasses       Perception         Praxis         Pertinent Vitals/Pain Pain Assessment Pain Assessment: No/denies pain     Extremity/Trunk Assessment Upper Extremity Assessment Upper Extremity Assessment: Right hand  dominant;RUE deficits/detail RUE Deficits / Details: pt with decreased R UE flexion to 90 degrees with palm externally rotating. pt with ataxic movement with decreased nose to finger undershooting. pt demonstrates 3+ out of 5 RUE Coordination: decreased fine motor;decreased gross motor   Lower Extremity Assessment Lower Extremity Assessment: RLE deficits/detail RLE Deficits / Details: noted to have decreased strength compared to the L   Cervical / Trunk Assessment Cervical / Trunk Assessment: Normal Cervical / Trunk Exceptions: abrasion on posterior skull covered by dressing   Communication  Communication Communication: No apparent difficulties   Cognition Arousal: Alert Behavior During Therapy: Flat affect Overall Cognitive Status: Impaired/Different from baseline Area of Impairment: Safety/judgement, Awareness                         Safety/Judgement: Decreased awareness of deficits Awareness: Emergent   General Comments: pt oriented to reason for admission and fall. pt does not have awarness to incontinence of bowel and bladder. pt prior to this reports no deficits with recognizing need to void.     General Comments  orthostatic BP (+) noted in vitals. increased risk for skin break down due to lack of awareness to incontinence. Staff advised to complete peri checks frequently. Requires 2L [place] -- RA dropped to 87%    Exercises     Shoulder Instructions      Home Living Family/patient expects to be discharged to:: Private residence Living Arrangements: Spouse/significant other Available Help at Discharge: Family;Available PRN/intermittently Type of Home: House Home Access: Stairs to enter Entrance Stairs-Number of Steps: threshhold Entrance Stairs-Rails: None Home Layout: One level     Bathroom Shower/Tub: Producer, Television/film/video: Standard     Home Equipment: Cane - single point;Rollator (4 wheels);Shower seat;Grab bars - tub/shower   Additional Comments: has a 83yo old rat terrier named Maggie that he adores  Lives With: Spouse    Prior Functioning/Environment Prior Level of Function : Independent/Modified Independent             Mobility Comments: ambulatory with SPC vs rollator ADLs Comments: no assistance. per 2023 chart was driving. pt verbalized in session he drives        OT Problem List: Decreased strength;Decreased activity tolerance;Impaired balance (sitting and/or standing);Decreased cognition;Decreased safety awareness;Decreased knowledge of use of DME or AE;Decreased knowledge of precautions;Cardiopulmonary status  limiting activity;Impaired UE functional use      OT Treatment/Interventions: Self-care/ADL training;Therapeutic exercise;Neuromuscular education;Energy conservation;DME and/or AE instruction;Manual therapy;Modalities;Therapeutic activities;Cognitive remediation/compensation;Patient/family education;Balance training    OT Goals(Current goals can be found in the care plan section) Acute Rehab OT Goals Patient Stated Goal: to get some cold water  OT Goal Formulation: Patient unable to participate in goal setting Time For Goal Achievement: 03/30/23 Potential to Achieve Goals: Good  OT Frequency: Min 1X/week    Co-evaluation              AM-PAC OT 6 Clicks Daily Activity     Outcome Measure Help from another person eating meals?: A Little Help from another person taking care of personal grooming?: A Lot Help from another person toileting, which includes using toliet, bedpan, or urinal?: A Lot Help from another person bathing (including washing, rinsing, drying)?: A Lot Help from another person to put on and taking off regular upper body clothing?: A Lot   6 Click Score: 11   End of Session Equipment Utilized During Treatment: Oxygen Nurse Communication: Mobility status;Precautions  Activity Tolerance: Patient tolerated treatment well Patient  left: in bed;with call bell/phone within reach;Other (comment) (alarm not set as the bed needed to be raised up for IV team that was present)  OT Visit Diagnosis: Unsteadiness on feet (R26.81);Muscle weakness (generalized) (M62.81)                Time: 9169-9140 OT Time Calculation (min): 29 min Charges:  OT General Charges $OT Visit: 1 Visit OT Evaluation $OT Eval Moderate Complexity: 1 Mod OT Treatments $Self Care/Home Management : 8-22 mins   Brynn, OTR/L  Acute Rehabilitation Services Office: [phone] .   Ely Molt 03/16/2023, 9:15 AM

## 2023-03-16 NOTE — Progress Notes (Signed)
During assessment, noted hematoma ion patient's tongue and sublingual, noted discoloration without any open wound, patient is unsure if he has bit his tongue when he fell, Dr. Roda Shutters updated. Insulin coverage changed to mild coverage without night correction per Dr. Cranston Neighbor coverage in [facility] per [facility]

## 2023-03-16 NOTE — TOC Initial Note (Signed)
 Transition of Care [facility]) - Initial/Assessment Note    Patient Details  Name: Harry Zamora MRN: [id number] Date of Birth: [birth year]  [facility] CM/SW Contact:    Andrez JULIANNA George, RN Phone Number: 03/16/2023, 3:03 PM  Clinical Narrative:                  Cm received call from family friend that assists at pts home. Pts wife is currently dealing with C-diff and is only able to assist pt with minimal tasks.  Chyrl (friend) says pt falls frequently at home. He has walker and cane but doesn't use them. He still drives even though the [place] MD told him he shouldn't drive.  OT saw the patient today and recommend CIR. CM called PT office and they can not see him today but may be able to re-assess tomorrow. CM will follow up in am.  CM has left voicemail for patient's spouse.  TOC following.  Expected Discharge Plan: IP Rehab Facility Barriers to Discharge: Continued Medical Work up   Patient Goals and CMS Choice   CMS Medicare.gov Compare Post Acute Care list provided to:: Patient Choice offered to / list presented to : Patient, Spouse      Expected Discharge Plan and Services   Discharge Planning Services: CM Consult Post Acute Care Choice: IP Rehab Living arrangements for the past 2 months: Single Family Home                                      Prior Living Arrangements/Services Living arrangements for the past 2 months: Single Family Home Lives with:: Spouse Patient language and need for interpreter reviewed:: Yes Do you feel safe going back to the place where you live?: Yes        Care giver support system in place?: Yes (comment) Current home services: DME (walker/ cane) Criminal Activity/Legal Involvement Pertinent to Current Situation/Hospitalization: No - Comment as needed  Activities of Daily Living   ADL Screening (condition at time of admission) Independently performs ADLs?: Yes (appropriate for developmental age) Is the patient deaf or have  difficulty hearing?: No Does the patient have difficulty seeing, even when wearing glasses/contacts?: No Does the patient have difficulty concentrating, remembering, or making decisions?: No  Permission Sought/Granted                  Emotional Assessment Appearance:: Appears stated age     Orientation: : Oriented to Place, Oriented to  Time, Oriented to Situation, Oriented to Self   Psych Involvement: No (comment)  Admission diagnosis:  Acute ischemic stroke (HCC) [I63.9] Abrasion of scalp, initial encounter [S00.01XA] Fall, initial encounter [W19.XXXA] Acute CVA (cerebrovascular accident) [facility]) [I63.9] Patient Active Problem List   Diagnosis Date Noted   Acute ischemic stroke (HCC) 03/14/2023   Orthostatic hypotension 11/18/2022   Gait abnormality 11/18/2022   Peripheral neuropathy 11/18/2022   Confusion 11/18/2022   Unilateral primary osteoarthritis, left knee 10/01/2021   Postural dizziness with presyncope 08/04/2021   Lumbar spinal stenosis 04/24/2021   Sepsis (HCC) 03/13/2021   AKI (acute kidney injury) (HCC) 03/13/2021   Elevated CK 03/13/2021   Severe sepsis (HCC) 03/13/2021   PNA (pneumonia) 03/12/2021   Restless legs    Acute respiratory failure with hypoxia [facility])    Coronary artery disease involving native coronary artery of native heart without angina pectoris 09/19/2020   Abnormal stress test 09/16/2020  Hyperkalemia 09/12/2020   Acute renal failure 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) 09/12/2020   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   Type 2 diabetes mellitus (HCC)    Syncope and collapse 08/07/2020   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   PAD (peripheral artery disease) (HCC) 08/30/2019   Spondylolisthesis of lumbar region 07/13/2019   Spinal stenosis of lumbar region with neurogenic claudication 10/02/2016   Diabetic polyneuropathy (HCC) 11/10/2013   PCP:  Nichole Senior, MD Pharmacy:   [facility]  #[id number] [place], [place] - [address] AT [facility] [address] [facility] [address] [place] [address] Phone: [phone] Fax: [phone]  [facility] [address] [place] [address] Phone: [phone] Fax: [phone]     Social Drivers of Health (SDOH) Social History: SDOH Screenings   Food Insecurity: No Food Insecurity (03/15/2023)  Housing: Low Risk  (03/15/2023)  Transportation Needs: No Transportation Needs (03/15/2023)  Utilities: Not At Risk (03/15/2023)  Social Connections: Socially Integrated (03/15/2023)  Tobacco Use: High Risk (03/14/2023)   SDOH Interventions:     Readmission Risk Interventions     No data to display

## 2023-03-17 DIAGNOSIS — I639 Cerebral infarction, unspecified: Secondary | ICD-10-CM | POA: Diagnosis not present

## 2023-03-17 LAB — CBC
HCT: 24.6 % — ABNORMAL LOW (ref 39.0–52.0)
Hemoglobin: 8.2 g/dL — ABNORMAL LOW (ref 13.0–17.0)
MCH: 30.8 pg (ref 26.0–34.0)
MCHC: 33.3 g/dL (ref 30.0–36.0)
MCV: 92.5 fL (ref 80.0–100.0)
Platelets: 155 10*3/uL (ref 150–400)
RBC: 2.66 MIL/uL — ABNORMAL LOW (ref 4.22–5.81)
RDW: 12.7 % (ref 11.5–15.5)
WBC: 5.5 10*3/uL (ref 4.0–10.5)
nRBC: 0 % (ref 0.0–0.2)

## 2023-03-17 LAB — BASIC METABOLIC PANEL
Anion gap: 11 (ref 5–15)
BUN: 29 mg/dL — ABNORMAL HIGH (ref 8–23)
CO2: 23 mmol/L (ref 22–32)
Calcium: 8.3 mg/dL — ABNORMAL LOW (ref 8.9–10.3)
Chloride: 100 mmol/L (ref 98–111)
Creatinine, Ser: 1.62 mg/dL — ABNORMAL HIGH (ref 0.61–1.24)
GFR, Estimated: 42 mL/min — ABNORMAL LOW (ref >60)
Glucose, Bld: 162 mg/dL — ABNORMAL HIGH (ref 70–99)
Potassium: 3.7 mmol/L (ref 3.5–5.1)
Sodium: 134 mmol/L — ABNORMAL LOW (ref 135–145)

## 2023-03-17 LAB — GLUCOSE, CAPILLARY
Glucose-Capillary: 167 mg/dL — ABNORMAL HIGH (ref 70–99)
Glucose-Capillary: 166 mg/dL — ABNORMAL HIGH (ref 70–99)
Glucose-Capillary: 153 mg/dL — ABNORMAL HIGH (ref 70–99)
Glucose-Capillary: 104 mg/dL — ABNORMAL HIGH (ref 70–99)

## 2023-03-17 NOTE — Plan of Care (Signed)
  Problem: Pain Management: Goal: General experience of comfort will improve Outcome: Progressing   Problem: Safety: Goal: Ability to remain free from injury will improve Outcome: Progressing

## 2023-03-17 NOTE — Plan of Care (Signed)
  Problem: Health Behavior/Discharge Planning: Goal: Ability to manage health-related needs will improve Outcome: Not Progressing   Problem: Pain Management: Goal: General experience of comfort will improve Outcome: Not Progressing   Problem: Safety: Goal: Ability to remain free from injury will improve Outcome: Not Progressing   Problem: Skin Integrity: Goal: Risk for impaired skin integrity will decrease Outcome: Not Progressing   Problem: Metabolic: Goal: Ability to maintain appropriate glucose levels will improve Outcome: Not Progressing

## 2023-03-17 NOTE — Progress Notes (Addendum)
 STROKE TEAM PROGRESS NOTE   SUBJECTIVE (INTERVAL HISTORY) Patient sitting up in bed.  No acute events overnight.  Patient with decreased left arm fine motor movements.  MRI negative.  Patient does endorse a fall at the time of his symptoms.  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  No need for antithrombotic therapy as MRI is negative and patient has acute anemia.  Planned for CIR at discharge  OBJECTIVE Temp:  [97.8 F (36.6 C)-100.3 F (37.9 C)] 98.7 F (37.1 C) (01/01 0843) Pulse Rate:  [91-99] 95 (01/01 0843) Cardiac Rhythm: Normal sinus rhythm (01/01 0900) Resp:  [17-19] 17 (01/01 0843) BP: (102-160)/(57-70) 102/57 (01/01 0843) SpO2:  [91 %-98 %] 96 % (01/01 0843)  Recent Labs  Lab 03/16/23 0643 03/16/23 1236 03/16/23 1613 03/16/23 2106 03/17/23 0606  GLUCAP 148* 151* 145* 143* 167*   Recent Labs  Lab 03/14/23 0010 03/14/23 0015 03/15/23 0452 03/16/23 0903 03/17/23 0729  NA 136 137 134* 134* 134*  K 3.8 4.3 3.9 3.9 3.7  CL 102 101 101 101 100  CO2 25  --  23 23 23   GLUCOSE 74 74 99 134* 162*  BUN 19 30* 34* 33* 29*  CREATININE 1.34* 1.30* 1.88* 1.94* 1.62*  CALCIUM  9.1  --  7.9* 8.3* 8.3*   Recent Labs  Lab 03/14/23 0010  AST 35  ALT 20  ALKPHOS 64  BILITOT 1.4*  PROT 6.4*  ALBUMIN  3.3*   Recent Labs  Lab 03/14/23 0010 03/14/23 0015 03/15/23 0452 03/15/23 0751 03/16/23 0903 03/17/23 0729  WBC 13.7*  --  6.6  --  5.7 5.5  NEUTROABS 10.3*  --   --   --   --   --   HGB 11.1* 11.2* 8.0* 8.1* 8.7* 8.2*  HCT 34.4* 33.0* 24.4* 24.5* 25.3* 24.6*  MCV 96.6  --  94.2  --  93.0 92.5  PLT 188  --  136*  --  156 155   No results for input(s): CKTOTAL, CKMB, CKMBINDEX, TROPONINI in the last 168 hours. No results for input(s): LABPROT, INR in the last 72 hours.  No results for input(s): COLORURINE, LABSPEC, PHURINE, GLUCOSEU, HGBUR, BILIRUBINUR, KETONESUR, PROTEINUR,  UROBILINOGEN, NITRITE, LEUKOCYTESUR in the last 72 hours.  Invalid input(s): APPERANCEUR     Component Value Date/Time   CHOL 61 03/14/2023 0405   CHOL 137 10/03/2020 1138   TRIG 47 03/14/2023 0405   HDL 33 (L) 03/14/2023 0405   HDL 33 (L) 10/03/2020 1138   CHOLHDL 1.8 03/14/2023 0405   VLDL 9 03/14/2023 0405   LDLCALC 19 03/14/2023 0405   LDLCALC 82 10/03/2020 1138   Lab Results  Component Value Date   HGBA1C 7.5 (H) 03/15/2023   No results found for: LABOPIA, COCAINSCRNUR, LABBENZ, AMPHETMU, THCU, LABBARB  Recent Labs  Lab 03/14/23 0010  ETH <10   I have personally reviewed the radiological images below and agree with the radiology interpretations.  ECHOCARDIOGRAM COMPLETE Result Date: 03/15/2023    ECHOCARDIOGRAM REPORT   Patient Name:   LLOYDE LUDLAM Date of Exam: 03/15/2023 Medical Rec #:  [id number]      Height:       74.0 in Accession #:    [id number]     Weight:       148.6 lb Date of Birth:  1941/02/03       BSA:          1.916 m Patient Age:  82 years       BP:           116/51 mmHg Patient Gender: M              HR:           90 bpm. Exam Location:  Inpatient Procedure: 2D Echo, 3D Echo, Cardiac Doppler, Color Doppler and Strain Analysis Indications:    Stroke  History:        Patient has no prior history of Echocardiogram examinations.                 Risk Factors:Hypertension, Diabetes, Dyslipidemia and Current                 Smoker.  Sonographer:    Ozell Free Referring Phys: [id number] ASHISH ARORA  Sonographer Comments: Global longitudinal strain was attempted. IMPRESSIONS  1. Left ventricular ejection fraction, by estimation, is 55 to 60%. The left ventricle has normal function. The left ventricle has no regional wall motion abnormalities. Left ventricular diastolic parameters were normal.  2. Right ventricular systolic function is normal. The right ventricular size is normal. There is moderately elevated pulmonary artery systolic pressure.  3. The  mitral valve is normal in structure. No evidence of mitral valve regurgitation. No evidence of mitral stenosis.  4. The aortic valve is normal in structure. Aortic valve regurgitation is not visualized. No aortic stenosis is present.  5. The inferior vena cava is dilated in size with >50% respiratory variability, suggesting right atrial pressure of 8 mmHg. FINDINGS  Left Ventricle: Left ventricular ejection fraction, by estimation, is 55 to 60%. The left ventricle has normal function. The left ventricle has no regional wall motion abnormalities. The left ventricular internal cavity size was normal in size. There is  no left ventricular hypertrophy. Left ventricular diastolic parameters were normal. Right Ventricle: The right ventricular size is normal. No increase in right ventricular wall thickness. Right ventricular systolic function is normal. There is moderately elevated pulmonary artery systolic pressure. The tricuspid regurgitant velocity is 2.94 m/s, and with an assumed right atrial pressure of 15 mmHg, the estimated right ventricular systolic pressure is 49.6 mmHg. Left Atrium: Left atrial size was normal in size. Right Atrium: Right atrial size was normal in size. Pericardium: There is no evidence of pericardial effusion. Mitral Valve: The mitral valve is normal in structure. No evidence of mitral valve regurgitation. No evidence of mitral valve stenosis. Tricuspid Valve: The tricuspid valve is normal in structure. Tricuspid valve regurgitation is trivial. No evidence of tricuspid stenosis. Aortic Valve: The aortic valve is normal in structure. Aortic valve regurgitation is not visualized. No aortic stenosis is present. Aortic valve mean gradient measures 5.0 mmHg. Aortic valve peak gradient measures 9.5 mmHg. Aortic valve area, by VTI measures 2.57 cm. Pulmonic Valve: The pulmonic valve was normal in structure. Pulmonic valve regurgitation is not visualized. No evidence of pulmonic stenosis. Aorta: The  aortic root is normal in size and structure. Venous: The inferior vena cava is dilated in size with greater than 50% respiratory variability, suggesting right atrial pressure of 8 mmHg. IAS/Shunts: No atrial level shunt detected by color flow Doppler.  LEFT VENTRICLE PLAX 2D LVIDd:         4.40 cm   Diastology LVIDs:         2.80 cm   LV e' medial:    8.92 cm/s LV PW:         1.00 cm   LV E/e' medial:  8.4 LV IVS:        1.00 cm   LV e' lateral:   9.57 cm/s LVOT diam:     2.20 cm   LV E/e' lateral: 7.8 LV SV:         68 LV SV Index:   36 LVOT Area:     3.80 cm                           3D Volume EF:                          3D EF:        53 %                          LV EDV:       150 ml                          LV ESV:       70 ml                          LV SV:        80 ml RIGHT VENTRICLE             IVC RV Basal diam:  4.40 cm     IVC diam: 2.30 cm RV S prime:     15.30 cm/s TAPSE (M-mode): 3.4 cm LEFT ATRIUM           Index        RIGHT ATRIUM           Index LA diam:      3.40 cm 1.77 cm/m   RA Area:     17.80 cm LA Vol (A2C): 59.8 ml 31.22 ml/m  RA Volume:   50.70 ml  26.47 ml/m LA Vol (A4C): 55.8 ml 29.13 ml/m  AORTIC VALVE AV Area (Vmax):    2.24 cm AV Area (Vmean):   2.24 cm AV Area (VTI):     2.57 cm AV Vmax:           154.00 cm/s AV Vmean:          106.000 cm/s AV VTI:            0.265 m AV Peak Grad:      9.5 mmHg AV Mean Grad:      5.0 mmHg LVOT Vmax:         90.70 cm/s LVOT Vmean:        62.400 cm/s LVOT VTI:          0.179 m LVOT/AV VTI ratio: 0.68  AORTA Ao Root diam: 3.60 cm Ao Asc diam:  3.00 cm MITRAL VALVE               TRICUSPID VALVE MV Area (PHT): 3.68 cm    TR Peak grad:   34.6 mmHg MV Decel Time: 206 msec    TR Vmax:        294.00 cm/s MV E velocity: 74.90 cm/s MV A velocity: 94.30 cm/s  SHUNTS MV E/A ratio:  0.79        Systemic VTI:  0.18 m  Systemic Diam: 2.20 cm Aditya Sabharwal Electronically signed by Ria Commander Signature Date/Time:  03/15/2023/10:55:31 AM    Final    CT ABDOMEN PELVIS WO CONTRAST Result Date: 03/15/2023 CLINICAL DATA:  Abdominal pain. Acute decrease in hemoglobin with tender abdomen. Evaluate for hematoma EXAM: CT ABDOMEN AND PELVIS WITHOUT CONTRAST TECHNIQUE: Multidetector CT imaging of the abdomen and pelvis was performed following the standard protocol without IV contrast. RADIATION DOSE REDUCTION: This exam was performed according to the departmental dose-optimization program which includes automated exposure control, adjustment of the mA and/or kV according to patient size and/or use of iterative reconstruction technique. COMPARISON:  03/14/2023 FINDINGS: Lower chest: Dependent collapse/consolidation in both lower lobes is progressive in the interval with areas of peripheral airway impaction in the dependent lower lungs. Hepatobiliary: No suspicious focal abnormality in the liver on this study without intravenous contrast. Cholecystectomy. No intrahepatic or extrahepatic biliary dilation. Pancreas: No focal mass lesion. No dilatation of the main duct. No intraparenchymal cyst. Diffuse atrophy. Spleen: No splenomegaly. No suspicious focal mass lesion. Adrenals/Urinary Tract: No adrenal nodule or mass. Small nonobstructing stones are evident in both kidneys. High attenuation in the renal pelvis of each kidney in the ureters and the bladder lumen is compatible with excreted contrast from yesterday's CT scan. No hydroureteronephrosis. Bladder lumen opacified but otherwise unremarkable. Stomach/Bowel: Stomach is unremarkable. No gastric wall thickening. No evidence of outlet obstruction. Duodenum is normally positioned as is the ligament of Treitz. No small bowel wall thickening. No small bowel dilatation. The terminal ileum is normal. The appendix is normal. The tip of the appendix appears to be filled with soft tissue (51/3) without distension. This finding is stable comparing back to a CT from 03/24/2021 but appears  minimally progressive when comparing to 09/12/2020. No gross colonic mass. No colonic wall thickening. Moderate to large stool volume evident. Vascular/Lymphatic: There is moderate atherosclerotic calcification of the abdominal aorta without aneurysm. There is no gastrohepatic or hepatoduodenal ligament lymphadenopathy. No retroperitoneal or mesenteric lymphadenopathy. No pelvic sidewall lymphadenopathy. 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 Other: No intraperitoneal free fluid. Musculoskeletal: No pelvic sidewall or retroperitoneal hematoma. There is some minimal thickening of the right posterior renal fascia, stable to minimally progressive since 03/14/2023, but this is only minimal and is nonspecific, not representing a discrete hematoma or substantial hemorrhage. Lumbar fusion hardware evident. The patient is noted to have asymmetry of the gluteus musculature inferiorly with asymmetric enlargement on the left although this is stable to minimally increased compared to 03/14/2023 and has been incompletely visualized. IMPRESSION: 1. No pelvic sidewall or retroperitoneal hematoma. There is some minimal thickening of the right posterior renal fascia, stable to minimally progressive since 03/14/2023, but this is only minimal and is nonspecific, not representing a discrete hematoma or substantial hemorrhage. 2. Asymmetry of the gluteus musculature inferiorly with asymmetric enlargement on the left although this is stable to may be minimally increased compared to 03/14/2023 and has been incompletely visualized. There is not a discrete hematoma a discernible on this study but a component of intramuscular hemorrhage could have this appearance. This would likely be amenable to clinical inspection. 3. Dependent collapse/consolidation in both lower lobes is progressive in the interval with areas of peripheral airway impaction in the dependent lower lungs. Imaging features compatible may be infectious  although aspiration could have this appearance. 4. The tip of the appendix appears to be filled with soft tissue without distension. This finding is stable comparing back to a CT from 03/24/2021 but appears minimally  progressive when comparing to 09/12/2020. This is a nonspecific finding and may be related to mucocele. Follow-up CT in 6 months recommended. Non emergent follow-up surgical consultation recommended. 5. Small nonobstructing stones in both kidneys. 6.  Aortic Atherosclerosis (ICD10-I70.0). Electronically Signed   By: Camellia Candle M.D.   On: 03/15/2023 06:24   MR BRAIN WO CONTRAST Result Date: 03/15/2023 CLINICAL DATA:  Stroke/TIA.  Status post T NK. EXAM: MRI HEAD WITHOUT CONTRAST TECHNIQUE: Multiplanar, multiecho pulse sequences of the brain and surrounding structures were obtained without intravenous contrast. COMPARISON:  01/18/2023 FINDINGS: Brain: No acute infarct, mass effect or extra-axial collection. 3-5 chronic microhemorrhages in the cerebellum. There is multifocal hyperintense T2-weighted signal within the white matter. Parenchymal volume and CSF spaces are normal. The midline structures are normal. Vascular: Normal flow voids. Skull and upper cervical spine: Posterior scalp subgaleal collection. Sinuses/Orbits:No paranasal sinus fluid levels or advanced mucosal thickening. No mastoid or middle ear effusion. Normal orbits. IMPRESSION: 1. No acute intracranial abnormality. 2. Findings of chronic small vessel ischemia. 3. Posterior scalp subgaleal hematoma. Electronically Signed   By: Franky Stanford M.D.   On: 03/15/2023 00:01   CT HEAD WO CONTRAST ( ) Result Date: 03/14/2023 CLINICAL DATA:  Stroke, follow-up. Persistent right upper extremity ataxia and right lower extremity dysmetria. Status post TNK. EXAM: CT HEAD WITHOUT CONTRAST TECHNIQUE: Contiguous axial images were obtained from the base of the skull through the vertex without intravenous contrast. RADIATION DOSE REDUCTION: This  exam was performed according to the departmental dose-optimization program which includes automated exposure control, adjustment of the mA and/or kV according to patient size and/or use of iterative reconstruction technique. COMPARISON:  CT head and CT angio head and neck 03/14/2023 at 12:15 a.m. FINDINGS: Brain: Mild atrophy and white matter disease is stable. No acute infarct or hemorrhage is present. Deep brain nuclei are within normal limits. The ventricles are proportionate to the degree of atrophy. No significant extraaxial fluid collection is present. The brainstem and cerebellum are within normal limits. Midline structures are within normal limits. Vascular: Atherosclerotic calcifications are again noted within the cavernous internal carotid arteries bilaterally and at the dural margin of the right vertebral artery. No hyperdense vessel is present. Skull: A left parietal and occipital scalp hematoma is new. No acute fracture is present. Sinuses/Orbits: The paranasal sinuses and mastoid air cells are clear. Bilateral lens replacements are noted. Globes and orbits are otherwise unremarkable. IMPRESSION: 1. No acute intracranial abnormality or significant interval change. 2. Stable atrophy and white matter disease. This likely reflects the sequela of chronic microvascular ischemia. 3. New left parietal and occipital scalp hematoma without underlying fracture. Electronically Signed   By: Lonni Necessary M.D.   On: 03/14/2023 17:54   CT CHEST ABDOMEN PELVIS W CONTRAST Result Date: 03/14/2023 CLINICAL DATA:  Chest trauma, blunt Abdominal trauma, blunt. Stroke, fall. EXAM: CT CHEST, ABDOMEN, AND PELVIS WITH CONTRAST TECHNIQUE: Multidetector CT imaging of the chest, abdomen and pelvis was performed following the standard protocol during bolus administration of intravenous contrast. RADIATION DOSE REDUCTION: This exam was performed according to the departmental dose-optimization program which includes  automated exposure control, adjustment of the mA and/or kV according to patient size and/or use of iterative reconstruction technique. CONTRAST:  75mL OMNIPAQUE  IOHEXOL  350 MG/ML SOLN COMPARISON:  CT angio chest 11/18/2022, CT angio chest 03/18/2021 FINDINGS: CHEST: Cardiovascular: No aortic injury. The thoracic aorta is normal in caliber. The heart is normal in size. No significant pericardial effusion. Moderate atherosclerotic plaque. At least 2 vessel  coronary artery calcification. The main pulmonary artery is normal in caliber. No central or segmental pulmonary embolus. Limited evaluation more distally due to timing of contrast and motion artifact. Mediastinum/Nodes: No pneumomediastinum. No mediastinal hematoma. The esophagus is unremarkable. The thyroid  is unremarkable. The central airways are patent. No mediastinal, hilar, or axillary lymphadenopathy. Lungs/Pleura: Bilateral lower lobe bronchial wall thickening. Bilateral lower lobe mucous plugging, right greater than left. Bibasilar atelectasis. Left lower lobe ground-glass airspace opacity. Stable left lower lobe calcified pulmonary micronodule-no further follow-up indicated. No pulmonary mass. No pulmonary contusion or laceration. No pneumatocele formation. No pleural effusion. No pneumothorax. No hemothorax. Musculoskeletal/Chest wall: No chest wall mass. No acute rib or sternal fracture. No spinal fracture. ABDOMEN / PELVIS: Hepatobiliary: Not enlarged. No focal lesion. No laceration or subcapsular hematoma. Status post cholecystectomy.  No biliary ductal dilatation. Pancreas: Normal pancreatic contour. No main pancreatic duct dilatation. Spleen: Not enlarged. No focal lesion. No laceration, subcapsular hematoma, or vascular injury. Adrenals/Urinary Tract: No nodularity bilaterally. Bilateral kidneys enhance and excrete symmetrically. No hydronephrosis. No contusion, laceration, or subcapsular hematoma. No injury to the vascular structures or collecting  systems. No hydroureter. The urinary bladder is unremarkable. Stomach/Bowel: No small or large bowel wall thickening or dilatation. Stool throughout the colon. The appendix is unremarkable. Vasculature/Lymphatics: Severe atherosclerotic plaque no abdominal aorta or iliac aneurysm. No active contrast extravasation or pseudoaneurysm. No abdominal, pelvic, inguinal lymphadenopathy. Reproductive: Radiation seeds along the prostate. Other: No simple free fluid ascites. No pneumoperitoneum. No hemoperitoneum. No mesenteric hematoma identified. No organized fluid collection. Musculoskeletal: No significant soft tissue hematoma. No acute pelvic fracture. No spinal fracture. L2-S2 posterolateral interbody surgical hardware fusion. Ports and Devices: None. IMPRESSION: 1. No acute intrathoracic, intra-abdominal, intrapelvic traumatic injury. 2. No acute fracture or traumatic malalignment of the thoracic or lumbar spine. 3. Other imaging findings of potential clinical significance: Bilateral lower lobe bronchial wall thickening with bilateral mucous plugging and patchy ground-glass airspace opacity left lower lobe-question developing infection/inflammation. Stool throughout the colon-correlate for constipation. Aortic Atherosclerosis (ICD10-I70.0)-severe. Electronically Signed   By: Morgane  Naveau M.D.   On: 03/14/2023 03:34   DG Chest Port 1 View Result Date: 03/14/2023 CLINICAL DATA:  Recent fall EXAM: PORTABLE CHEST 1 VIEW COMPARISON:  11/01/2022 FINDINGS: Cardiac shadows within normal limits. Aortic calcifications are noted. Skin fold is noted over the right chest. Minimal left basilar atelectasis is seen. No focal confluent infiltrate is noted. No bony abnormality is noted. IMPRESSION: Mild left basilar atelectasis. Electronically Signed   By: Oneil Devonshire M.D.   On: 03/14/2023 00:58   CT ANGIO HEAD NECK W WO CM (CODE STROKE) Result Date: 03/14/2023 CLINICAL DATA:  Right arm weakness, right pupil blown, head  trauma EXAM: CT ANGIOGRAPHY HEAD AND NECK WITH AND WITHOUT CONTRAST TECHNIQUE: Multidetector CT imaging of the head and neck was performed using the standard protocol during bolus administration of intravenous contrast. Multiplanar CT image reconstructions and MIPs were obtained to evaluate the vascular anatomy. Carotid stenosis measurements (when applicable) are obtained utilizing NASCET criteria, using the distal internal carotid diameter as the denominator. RADIATION DOSE REDUCTION: This exam was performed according to the departmental dose-optimization program which includes automated exposure control, adjustment of the mA and/or kV according to patient size and/or use of iterative reconstruction technique. CONTRAST:  75mL OMNIPAQUE  IOHEXOL  350 MG/ML SOLN COMPARISON:  09/13/2022 CTA head and neck, correlation is made with 03/14/2023 CT head FINDINGS: CT HEAD FINDINGS For noncontrast findings, please see same day CT head. CTA NECK FINDINGS Aortic arch:  Standard branching. Imaged portion shows no evidence of aneurysm or dissection. No significant stenosis of the major arch vessel origins. Aortic atherosclerosis Right carotid system: No evidence of dissection, occlusion, or hemodynamically significant stenosis (greater than 50%). Left carotid system: No evidence of dissection, occlusion, or hemodynamically significant stenosis (greater than 50%). Vertebral arteries: Moderate stenosis at the origin of the left vertebral artery. Evaluation of the proximal left vertebral artery is limited by beam hardening artifact from the adjacent contrast bolus. The left vertebral artery is otherwise patent to the skull base. Mild stenosis at the origin of the right vertebral artery. The right vertebral artery is otherwise patent to the skull base. No evidence of dissection. Skeleton: No acute osseous abnormality. Degenerative changes in the cervical spine. Other neck: No acute finding. Upper chest: No focal pulmonary opacity or  pleural effusion. Review of the MIP images confirms the above findings CTA HEAD FINDINGS Anterior circulation: Both internal carotid arteries are patent to the termini, with mild stenosis in the bilateral cavernous segments and moderate to severe stenosis in the proximal supraclinoid segments bilaterally. A1 segments patent. Normal anterior communicating artery. Anterior cerebral arteries are patent to their distal aspects without significant stenosis. No M1 stenosis or occlusion. MCA branches are irregular but perfused to their distal aspects without significant stenosis. Posterior circulation: Vertebral arteries patent to the vertebrobasilar junction, with mild stenosis in the proximal right V4. The left vertebral artery primarily supplies the left PICA, and is quite diminutive after the PICA takeoff. Posterior inferior cerebellar arteries patent proximally. Basilar patent to its distal aspect without significant stenosis. Superior cerebellar arteries patent proximally. 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 Redemonstrated severe stenosis in the proximal left P2 (series 7, images 98-99), with multifocal irregularity in the remainder of the left P2. Evaluation of the more distal PCAs is limited by venous contamination. The bilateral posterior communicating arteries are not definitively seen. Venous sinuses: As permitted by contrast timing, patent. Anatomic variants: None significant. No evidence of aneurysm or vascular malformation. Review of the MIP images confirms the above findings IMPRESSION: 1. No intracranial large vessel occlusion. 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 2. Redemonstrated severe stenosis in the proximal left P2, with multifocal irregularity in the remainder of  the left P2. 3. 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 4. Moderate stenosis at the origin of the left vertebral artery and mild stenosis at the origin of the right vertebral artery. 5. Aortic atherosclerosis. Aortic Atherosclerosis (ICD10-I70.0). Electronically Signed   By: Donald Campion M.D.   On: 03/14/2023 00:46   CT Cervical Spine Wo Contrast Result Date: 03/14/2023 CLINICAL DATA:  Neck trauma, fell backwards EXAM: CT CERVICAL SPINE WITHOUT CONTRAST TECHNIQUE: Multidetector CT imaging of the cervical spine was performed without intravenous contrast. Multiplanar CT image reconstructions were also generated. RADIATION DOSE REDUCTION: This exam was performed according to the departmental dose-optimization program which includes automated exposure control, adjustment of the mA and/or kV according to patient size and/or use of iterative reconstruction technique. COMPARISON:  02/21/2023 CT cervical spine FINDINGS: Alignment: No traumatic listhesis. Straightening of the normal cervical lordosis. Trace stepwise anterolisthesis of C3 on C4 and C4 on C5. Skull base and vertebrae: No acute fracture. No primary bone lesion or focal pathologic process. Soft  tissues and spinal canal: No prevertebral fluid or swelling. No visible canal hematoma. Disc levels: Degenerative changes in the cervical spine. No high-grade spinal canal stenosis. Upper chest: Negative. IMPRESSION: No acute fracture or traumatic listhesis in the cervical spine. Electronically Signed   By: Donald Campion M.D.   On: 03/14/2023 00:34   CT HEAD CODE STROKE WO CONTRAST Result Date: 03/14/2023 CLINICAL DATA:  Code stroke. Right arm weakness, right pupil blown, trauma to posterior head after fall EXAM: CT HEAD WITHOUT CONTRAST TECHNIQUE: Contiguous axial images were obtained from the base of the skull through the vertex without intravenous contrast. RADIATION DOSE REDUCTION: This exam was performed  according to the departmental dose-optimization program which includes automated exposure control, adjustment of the mA and/or kV according to patient size and/or use of iterative reconstruction technique. COMPARISON:  02/21/2023 CT head FINDINGS: Brain: No evidence of acute infarction, hemorrhage, mass, mass effect, or midline shift. No hydrocephalus or extra-axial collection. Basal ganglia calcifications. Normal cerebral volume. Vascular: No hyperdense vessel. Atherosclerotic calcifications in the intracranial carotid and vertebral arteries. Skull: Negative for fracture or focal lesion. Left occipital scalp hematoma. Sinuses/Orbits: No acute finding. Other: The mastoid air cells are well aerated. ASPECTS [facility] Stroke Program Early CT Score) - Ganglionic level infarction (caudate, lentiform nuclei, internal capsule, insula, M1-M3 cortex): 7 - Supraganglionic infarction (M4-M6 cortex): 3 Total score (0-10 with 10 being normal): 10 IMPRESSION: 1. No acute intracranial process. ASPECTS is 10. 2. Left occipital scalp hematoma. Imaging results were communicated on 03/14/2023 at 12:21 am to provider Dr. Voncile via secure text paging. Electronically Signed   By: Donald Campion M.D.   On: 03/14/2023 00:21   CT Cervical Spine Wo Contrast Result Date: 02/21/2023 CLINICAL DATA:  83 year old male status post fall yesterday, legs gave out. Abrasions. Blurred vision. EXAM: CT CERVICAL SPINE WITHOUT CONTRAST TECHNIQUE: Multidetector CT imaging of the cervical spine was performed without intravenous contrast. Multiplanar CT image reconstructions were also generated. RADIATION DOSE REDUCTION: This exam was performed according to the departmental dose-optimization program which includes automated exposure control, adjustment of the mA and/or kV according to patient size and/or use of iterative reconstruction technique. COMPARISON:  CT head and face today.  Cervical spine CT 04/06/2022. FINDINGS: Alignment: Chronic straightening  but mildly increased reversal of cervical lordosis now. Cervicothoracic junction alignment is within normal limits. Mild chronic degenerative appearing anterolisthesis at both C3-C4 and C4-C5 with chronic facet degeneration. Bilateral posterior element alignment is within normal limits. Skull base and vertebrae: Bone mineralization is within normal limits. Visualized skull base is intact. No atlanto-occipital dissociation. C1 and C2 appear intact and aligned. No acute osseous abnormality identified. Soft tissues and spinal canal: No prevertebral fluid or swelling. No visible canal hematoma. Negative visible noncontrast neck soft tissues aside from carotid atherosclerosis. 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 Upper chest: Negative. IMPRESSION: 1. No acute traumatic injury identified in the cervical spine. 2. 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 Electronically Signed   By: VEAR Hurst M.D.   On: 02/21/2023 09:15   CT Maxillofacial Wo Contrast Result Date: 02/21/2023 CLINICAL DATA:  83 year old male status post fall yesterday, legs gave out. Abrasions. Blurred vision. EXAM: CT MAXILLOFACIAL WITHOUT CONTRAST TECHNIQUE: Multidetector CT imaging of the maxillofacial structures was performed. Multiplanar CT image reconstructions were also generated. RADIATION DOSE REDUCTION: This exam was performed according to the departmental dose-optimization program which includes automated exposure  control, adjustment of the mA and/or kV according to patient size and/or use of iterative reconstruction technique. COMPARISON:  CT head and cervical spine today. Prior face CT 11/02/2022. FINDINGS: Osseous: Mandible intact and normally located. Stable dentition. Bilateral maxilla, zygoma, pterygoid and nasal bones appear stable and intact. Central  skull base appears intact. Orbits: No acute orbital wall fracture. Chronic postoperative changes to the globes. No acute orbit soft tissue finding. Sinuses: Visualized paranasal sinuses and mastoids are stable and well aerated. Soft tissues: Negative visible noncontrast larynx, pharynx, parapharyngeal spaces, retropharyngeal space, sublingual space. Partial submandibular gland atrophy is stable. Masticator and parotid spaces appear negative. Calcified atherosclerosis. No soft tissue gas or superficial soft tissue injury identified other than the right forehead reported separately. Limited intracranial: Stable to that reported separately. IMPRESSION: No acute traumatic injury identified in the Face. Electronically Signed   By: VEAR Hurst M.D.   On: 02/21/2023 09:13   CT Head Wo Contrast Result Date: 02/21/2023 CLINICAL DATA:  83 year old male status post fall yesterday, legs gave out. Abrasions. Blurred vision. EXAM: CT HEAD WITHOUT CONTRAST TECHNIQUE: Contiguous axial images were obtained from the base of the skull through the vertex without intravenous contrast. RADIATION DOSE REDUCTION: This exam was performed according to the departmental dose-optimization program which includes automated exposure control, adjustment of the mA and/or kV according to patient size and/or use of iterative reconstruction technique. COMPARISON:  Brain MRI 01/18/2023 and earlier. Face and cervical spine CT today reported separately. FINDINGS: Brain: Stable cerebral volume. Stable basal ganglia vascular calcifications. No midline shift, mass effect, No midline shift, ventriculomegaly, mass effect, evidence of mass lesion, intracranial hemorrhage or evidence of cortically based acute infarction. Stable gray-white matter differentiation throughout the brain. Mild for age white matter hypodensity. Vascular: Calcified atherosclerosis at the skull base. No suspicious intracranial vascular hyperdensity. Skull: Stable.  No fracture identified.  Sinuses/Orbits: Visualized paranasal sinuses and mastoids are stable and well aerated. Other: Mild right forehead scalp hematoma or contusion series 3, image 18. Underlying right frontal bone intact. No scalp soft tissue gas. IMPRESSION: 1. Mild right forehead scalp hematoma or contusion. No skull fracture. 2. No acute intracranial abnormality. Stable mild for age chronic cerebral white matter changes. Electronically Signed   By: VEAR Hurst M.D.   On: 02/21/2023 09:09   PHYSICAL EXAM  Temp:  [97.8 F (36.6 C)-100.3 F (37.9 C)] 98.7 F (37.1 C) (01/01 0843) Pulse Rate:  [91-99] 95 (01/01 0843) Resp:  [17-19] 17 (01/01 0843) BP: (102-160)/(57-70) 102/57 (01/01 0843) SpO2:  [91 %-98 %] 96 % (01/01 0843)  General - Well nourished, well developed, in no apparent distress.  Coughing frequently.   Cardiovascular - Regular rhythm and rate.  Mental Status -  Level of arousal and orientation to time, place, and person were intact. Language including expression, naming, repetition, comprehension was assessed and found intact.  Cranial Nerves II - XII - II - VFF (glasses at baseline) III, IV, VI - EOMI V - Facial sensation intact bilaterally VII - Facial movement symmetrical VIII - Hearing & vestibular intact bilaterally. X - Palate elevates symmetrically.  Phonation intact XI - Chin turning & shoulder shrug intact bilaterally. XII - Tongue protrusion intact.  Motor Strength - Some mild decreased strength in the right lower extremity compared to the left this morning.  Both lower extremities are able to elevate antigravity briefly, left sustained antigravity position with right lower extremity drift.   Sensory - Light touch symmetrical throughout.   Coordination - Decreased RUE  fine motor movements.  Tremor was absent.  Gait and Station - deferred.  ASSESSMENT/PLAN Mr. Harry Zamora is a 83 y.o. male with history of HTN, HLD, DM, prostate Ca, LBP admitted for fell at home, right sided  weakness ataxia. TNK was given.    Stroke like episode treated with IV TNK: Unwitnessed fall with concussion and transient right-sided weakness not clear if related to syncope or orthostatic hypotension CT no acute finding CTA head and neck b/l P1/P2 severe stenosis. 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 Left VA origin moderate stenosis MRI no acute infarct 2D Echo: LVEF 55 to 60%, LDL 19 HgbA1c: 7.5 SCDs for VTE prophylaxis clopidogrel  75 mg daily prior to admission, now on No antithrombotic due to negative MRI and acute anemia (improved from yesterday) Ongoing aggressive stroke risk factor management Therapy recommendations:  CIR Disposition:  pending CIR admission   Hx of hypertension Orthostatic hypotension Stable on the low end Orthostatic vitals - supine 122/59, sitting 85/49. standing 66/49 but asymptomaticd Encourage po intake Continue TED hose Continue midodrine  5mg  tid from 8am to 5pm Off IVF BP goal < 180/105 Long term BP goal normotensive  Acute anemia  Scalp laceration with prior bleeding Fall with scalp laceration -> bleeding after TNK -> now stopped Hb 11.1->8.0->8.1->8.7->8.2 CT C/A/P x 2 - No acute intrathoracic, intra-abdominal, intrapelvic traumatic injury. No pelvic sidewall or retroperitoneal hematoma. Asymmetry of the gluteus musculature inferiorly with asymmetric enlargement on the left - no hematoma on exam Continue CBC monitoring  Diabetes HgbA1c 7.5 goal < 7.0 Uncontrolled CBG monitoring SSI DM education and close PCP follow up  Hyperlipidemia Home meds:  crestor  40  LDL 19, goal < 70 Now decreased to Crestor  20 Continue statin at discharge  Other Stroke Risk Factors Advanced age  Other Active Problems LBP Prostate cancer  Hospital day # 3  Pt seen by Neuro NP/APP and later by MD. Note/plan to be edited by MD as needed.    Stevi Toberman, AGACNP-BC [facility] Pager: [phone] I have personally obtained  history,examined this patient, reviewed notes, independently viewed imaging studies, participated in medical decision making and plan of care.ROS completed by me personally and pertinent positives fully documented  I have made any additions or clarifications directly to the above note. Agree with note above.  Continue ongoing therapies and transfer to inpatient rehab in the next few days after insurance approval and when bed available.  Eather Popp, MD Medical Director [facility] Pager: [phone] 03/17/2023 2:41 PM  To contact Stroke Continuity provider, please refer to [email or web address]. After hours, contact General Neurology

## 2023-03-17 NOTE — Progress Notes (Signed)
 Physical Therapy Treatment Patient Details Name: Harry Zamora MRN: [id number] DOB: [birth year] Today's Date: 03/17/2023   History of Present Illness 83 y.o. male presents to [facility] hospital on 03/14/2023 after a fall and with R weakness. Pt received TNK. CT negative, MRI negative. PMH includes OA, depression, HTN, HLD, prostate cancer, DMII.    PT Comments  Pt is lying in bed with lunch untouched on tray. Pt seemingly unaware that it was there, but looks at it and says he is not hungry. Agreeable to get up with therapy, however needs modA for sequencing coming to [facility]. Pt known to have incontinence of bowels with movement so asked pt if he felt need to use bathroom. Pt reports he has no need, but PT placed BSC beside bed. Pt requiring modA for power up to RW and barely able to turn to [facility] before incontinence of bowels. Pt with increased fatigue with sitting on BSC and requiring mod A for return to bed. In reading prior PT notes it appears pt has had a decline in both cognition and function. Patient will benefit from intensive inpatient follow up therapy, >3 hours/day. PT to continue to follow acutely.     If plan is discharge home, recommend the following: A little help with walking and/or transfers;A little help with bathing/dressing/bathroom;Assistance with cooking/housework;Supervision due to cognitive status;Help with stairs or ramp for entrance;Assist for transportation   Can travel by private vehicle      no  Equipment Recommendations  BSC/3in1       Precautions / Restrictions Precautions Precautions: Fall Precaution Comments: history of many falls Restrictions Weight Bearing Restrictions Per Provider Order: No     Mobility  Bed Mobility Overal bed mobility: Needs Assistance Bed Mobility: Rolling, Supine to Sit, Sit to Supine Rolling: Min assist, Used rails   Supine to sit: Mod assist, Used rails, HOB elevated Sit to supine: Min assist, Used rails   General bed mobility comments:  pt with decreased problem solving with coming to EoB, ultimately needing modA for pulling trunk to upright, min A for returning LE to bed, min A for rolling to place clean pad as pt unaware that he had BM in bed    Transfers Overall transfer level: Needs assistance Equipment used: Rolling walker (2 wheels) Transfers: Bed to chair/wheelchair/BSC     Step pivot transfers: Mod assist       General transfer comment: pt reports no need to use bathroom when he gets up but at PT request pt does agree to getting to [facility], pt barely makes it to [facility] before having BM, mod physical assist for steadying with weightshift onto R LE and maximal cuing for sequencing stand and pivot to and from [facility],    Modified Rankin (Stroke Patients Only) Modified Rankin (Stroke Patients Only) Pre-Morbid Rankin Score: Moderate disability Modified Rankin: Moderately severe disability     Balance Overall balance assessment: Needs assistance Sitting-balance support: Bilateral upper extremity supported, Feet supported Sitting balance-Leahy Scale: Fair     Standing balance support: Bilateral upper extremity supported, During functional activity, Reliant on assistive device for balance Standing balance-Leahy Scale: Poor                              Cognition Arousal: Alert Behavior During Therapy: Flat affect Overall Cognitive Status: Impaired/Different from baseline Area of Impairment: Safety/judgement, Awareness  Safety/Judgement: Decreased awareness of deficits Awareness: Emergent   General Comments: pt continues to have no awareness of bowels,           General Comments General comments (skin integrity, edema, etc.): Increased R LE weakness with pivot transfer, VSS on RA      Pertinent Vitals/Pain Pain Assessment Pain Assessment: No/denies pain     PT Goals (current goals can now be found in the care plan section) Acute Rehab PT Goals PT Goal  Formulation: With patient Time For Goal Achievement: 03/28/23 Potential to Achieve Goals: Fair Progress towards PT goals: Not progressing toward goals - comment    Frequency    Min 1X/week       AM-PAC PT 6 Clicks Mobility   Outcome Measure  Help needed turning from your back to your side while in a flat bed without using bedrails?: None Help needed moving from lying on your back to sitting on the side of a flat bed without using bedrails?: None Help needed moving to and from a bed to a chair (including a wheelchair)?: A Little Help needed standing up from a chair using your arms (e.g., wheelchair or bedside chair)?: A Little Help needed to walk in hospital room?: A Little Help needed climbing 3-5 steps with a railing? : A Little 6 Click Score: 20    End of Session Equipment Utilized During Treatment: Gait belt Activity Tolerance: Patient limited by fatigue Patient left: with call bell/phone within reach;in bed;with bed alarm set Nurse Communication: Mobility status PT Visit Diagnosis: Other abnormalities of gait and mobility (R26.89);Muscle weakness (generalized) (M62.81);History of falling (Z91.81)     Time: 1340-1407 PT Time Calculation (min) (ACUTE ONLY): 27 min  Charges:    $Therapeutic Activity: 23-37 mins PT General Charges $$ ACUTE PT VISIT: 1 Visit                     Tysha Grismore B. Fleeta Lapidus PT, DPT Acute Rehabilitation Services Please use secure chat or  Call Office [phone]    Almarie KATHEE Fleeta [facility] 03/17/2023, 3:06 PM

## 2023-03-18 ENCOUNTER — Inpatient Hospital Stay (HOSPITAL_COMMUNITY): Payer: Medicare HMO

## 2023-03-18 DIAGNOSIS — I639 Cerebral infarction, unspecified: Secondary | ICD-10-CM

## 2023-03-18 DIAGNOSIS — G8250 Quadriplegia, unspecified: Secondary | ICD-10-CM

## 2023-03-18 LAB — BASIC METABOLIC PANEL
Anion gap: 11 (ref 5–15)
BUN: 30 mg/dL — ABNORMAL HIGH (ref 8–23)
CO2: 25 mmol/L (ref 22–32)
Calcium: 8.3 mg/dL — ABNORMAL LOW (ref 8.9–10.3)
Chloride: 100 mmol/L (ref 98–111)
Creatinine, Ser: 1.55 mg/dL — ABNORMAL HIGH (ref 0.61–1.24)
GFR, Estimated: 44 mL/min — ABNORMAL LOW (ref >60)
Glucose, Bld: 146 mg/dL — ABNORMAL HIGH (ref 70–99)
Potassium: 3.8 mmol/L (ref 3.5–5.1)
Sodium: 136 mmol/L (ref 135–145)

## 2023-03-18 LAB — CBC
HCT: 23.9 % — ABNORMAL LOW (ref 39.0–52.0)
Hemoglobin: 8.1 g/dL — ABNORMAL LOW (ref 13.0–17.0)
MCH: 31.2 pg (ref 26.0–34.0)
MCHC: 33.9 g/dL (ref 30.0–36.0)
MCV: 91.9 fL (ref 80.0–100.0)
Platelets: 191 10*3/uL (ref 150–400)
RBC: 2.6 MIL/uL — ABNORMAL LOW (ref 4.22–5.81)
RDW: 12.8 % (ref 11.5–15.5)
WBC: 6.8 10*3/uL (ref 4.0–10.5)
nRBC: 0 % (ref 0.0–0.2)

## 2023-03-18 LAB — GLUCOSE, CAPILLARY
Glucose-Capillary: 164 mg/dL — ABNORMAL HIGH (ref 70–99)
Glucose-Capillary: 167 mg/dL — ABNORMAL HIGH (ref 70–99)
Glucose-Capillary: 147 mg/dL — ABNORMAL HIGH (ref 70–99)
Glucose-Capillary: 160 mg/dL — ABNORMAL HIGH (ref 70–99)

## 2023-03-18 MED ORDER — GUAIFENESIN 100 MG/5ML PO LIQD
5.0000 mL | ORAL | Status: DC | PRN
  Administered 2023-03-18 – 2023-03-22 (×3): 5 mL via ORAL
  Filled 2023-03-18 (×3): qty 5

## 2023-03-18 NOTE — Progress Notes (Signed)
 Patient is back to the unit.

## 2023-03-18 NOTE — Progress Notes (Signed)
 STROKE TEAM PROGRESS NOTE   SUBJECTIVE (INTERVAL HISTORY) Patient sitting up in bed.  No acute events overnight.  Patient with decreased left arm fine motor movements.  Patient is still having significant trouble with walking.  MRI brain shows no acute abnormality but patient did fall and hit the back of his leg.  CT scan of cervical spine showed no fracture.  Will check MRI of C-spine to rule out any spinal cord injury.  Planned for CIR at discharge  OBJECTIVE Temp:  [97.3 F (36.3 C)-100.6 F (38.1 C)] 97.3 F (36.3 C) (01/02 1556) Pulse Rate:  [65-99] 78 (01/02 1556) Cardiac Rhythm: Normal sinus rhythm (01/02 0700) Resp:  [16-20] 17 (01/02 1556) BP: (119-147)/(61-72) 147/68 (01/02 1556) SpO2:  [95 %-100 %] 100 % (01/02 1556)  Recent Labs  Lab 03/17/23 1651 03/17/23 2155 03/18/23 0616 03/18/23 1155 03/18/23 1555  GLUCAP 153* 104* 164* 167* 147*   Recent Labs  Lab 03/14/23 0010 03/14/23 0015 03/15/23 0452 03/16/23 0903 03/17/23 0729 03/18/23 0728  NA 136 137 134* 134* 134* 136  K 3.8 4.3 3.9 3.9 3.7 3.8  CL 102 101 101 101 100 100  CO2 25  --  23 23 23 25   GLUCOSE 74 74 99 134* 162* 146*  BUN 19 30* 34* 33* 29* 30*  CREATININE 1.34* 1.30* 1.88* 1.94* 1.62* 1.55*  CALCIUM  9.1  --  7.9* 8.3* 8.3* 8.3*   Recent Labs  Lab 03/14/23 0010  AST 35  ALT 20  ALKPHOS 64  BILITOT 1.4*  PROT 6.4*  ALBUMIN  3.3*   Recent Labs  Lab 03/14/23 0010 03/14/23 0015 03/15/23 0452 03/15/23 0751 03/16/23 0903 03/17/23 0729 03/18/23 0728  WBC 13.7*  --  6.6  --  5.7 5.5 6.8  NEUTROABS 10.3*  --   --   --   --   --   --   HGB 11.1*   < > 8.0* 8.1* 8.7* 8.2* 8.1*  HCT 34.4*   < > 24.4* 24.5* 25.3* 24.6* 23.9*  MCV 96.6  --  94.2  --  93.0 92.5 91.9  PLT 188  --  136*  --  156 155 191   < > = values in this interval not displayed.   No results for input(s): CKTOTAL, CKMB, CKMBINDEX, TROPONINI in the last 168 hours. No results for input(s): LABPROT, INR in the  last 72 hours.  No results for input(s): COLORURINE, LABSPEC, PHURINE, GLUCOSEU, HGBUR, BILIRUBINUR, KETONESUR, PROTEINUR, UROBILINOGEN, NITRITE, LEUKOCYTESUR in the last 72 hours.  Invalid input(s): APPERANCEUR     Component Value Date/Time   CHOL 61 03/14/2023 0405   CHOL 137 10/03/2020 1138   TRIG 47 03/14/2023 0405   HDL 33 (L) 03/14/2023 0405   HDL 33 (L) 10/03/2020 1138   CHOLHDL 1.8 03/14/2023 0405   VLDL 9 03/14/2023 0405   LDLCALC 19 03/14/2023 0405   LDLCALC 82 10/03/2020 1138   Lab Results  Component Value Date   HGBA1C 7.5 (H) 03/15/2023   No results found for: LABOPIA, COCAINSCRNUR, LABBENZ, AMPHETMU, THCU, LABBARB  Recent Labs  Lab 03/14/23 0010  ETH <10   I have personally reviewed the radiological images below and agree with the radiology interpretations.  ECHOCARDIOGRAM COMPLETE Result Date: 03/15/2023    ECHOCARDIOGRAM REPORT   Patient Name:   Harry Zamora Date of Exam: 03/15/2023 Medical Rec #:  [id number]      Height:       74.0 in Accession #:    [id number]  Weight:       148.6 lb Date of Birth:  14-Nov-1940       BSA:          1.916 m Patient Age:    82 years       BP:           116/51 mmHg Patient Gender: M              HR:           90 bpm. Exam Location:  Inpatient Procedure: 2D Echo, 3D Echo, Cardiac Doppler, Color Doppler and Strain Analysis Indications:    Stroke  History:        Patient has no prior history of Echocardiogram examinations.                 Risk Factors:Hypertension, Diabetes, Dyslipidemia and Current                 Smoker.  Sonographer:    Ozell Free Referring Phys: [id number] ASHISH ARORA  Sonographer Comments: Global longitudinal strain was attempted. IMPRESSIONS  1. Left ventricular ejection fraction, by estimation, is 55 to 60%. The left ventricle has normal function. The left ventricle has no regional wall motion abnormalities. Left ventricular diastolic parameters were normal.  2. Right ventricular  systolic function is normal. The right ventricular size is normal. There is moderately elevated pulmonary artery systolic pressure.  3. The mitral valve is normal in structure. No evidence of mitral valve regurgitation. No evidence of mitral stenosis.  4. The aortic valve is normal in structure. Aortic valve regurgitation is not visualized. No aortic stenosis is present.  5. The inferior vena cava is dilated in size with >50% respiratory variability, suggesting right atrial pressure of 8 mmHg. FINDINGS  Left Ventricle: Left ventricular ejection fraction, by estimation, is 55 to 60%. The left ventricle has normal function. The left ventricle has no regional wall motion abnormalities. The left ventricular internal cavity size was normal in size. There is  no left ventricular hypertrophy. Left ventricular diastolic parameters were normal. Right Ventricle: The right ventricular size is normal. No increase in right ventricular wall thickness. Right ventricular systolic function is normal. There is moderately elevated pulmonary artery systolic pressure. The tricuspid regurgitant velocity is 2.94 m/s, and with an assumed right atrial pressure of 15 mmHg, the estimated right ventricular systolic pressure is 49.6 mmHg. Left Atrium: Left atrial size was normal in size. Right Atrium: Right atrial size was normal in size. Pericardium: There is no evidence of pericardial effusion. Mitral Valve: The mitral valve is normal in structure. No evidence of mitral valve regurgitation. No evidence of mitral valve stenosis. Tricuspid Valve: The tricuspid valve is normal in structure. Tricuspid valve regurgitation is trivial. No evidence of tricuspid stenosis. Aortic Valve: The aortic valve is normal in structure. Aortic valve regurgitation is not visualized. No aortic stenosis is present. Aortic valve mean gradient measures 5.0 mmHg. Aortic valve peak gradient measures 9.5 mmHg. Aortic valve area, by VTI measures 2.57 cm. Pulmonic Valve:  The pulmonic valve was normal in structure. Pulmonic valve regurgitation is not visualized. No evidence of pulmonic stenosis. Aorta: The aortic root is normal in size and structure. Venous: The inferior vena cava is dilated in size with greater than 50% respiratory variability, suggesting right atrial pressure of 8 mmHg. IAS/Shunts: No atrial level shunt detected by color flow Doppler.  LEFT VENTRICLE PLAX 2D LVIDd:         4.40 cm   Diastology LVIDs:  2.80 cm   LV e' medial:    8.92 cm/s LV PW:         1.00 cm   LV E/e' medial:  8.4 LV IVS:        1.00 cm   LV e' lateral:   9.57 cm/s LVOT diam:     2.20 cm   LV E/e' lateral: 7.8 LV SV:         68 LV SV Index:   36 LVOT Area:     3.80 cm                           3D Volume EF:                          3D EF:        53 %                          LV EDV:       150 ml                          LV ESV:       70 ml                          LV SV:        80 ml RIGHT VENTRICLE             IVC RV Basal diam:  4.40 cm     IVC diam: 2.30 cm RV S prime:     15.30 cm/s TAPSE (M-mode): 3.4 cm LEFT ATRIUM           Index        RIGHT ATRIUM           Index LA diam:      3.40 cm 1.77 cm/m   RA Area:     17.80 cm LA Vol (A2C): 59.8 ml 31.22 ml/m  RA Volume:   50.70 ml  26.47 ml/m LA Vol (A4C): 55.8 ml 29.13 ml/m  AORTIC VALVE AV Area (Vmax):    2.24 cm AV Area (Vmean):   2.24 cm AV Area (VTI):     2.57 cm AV Vmax:           154.00 cm/s AV Vmean:          106.000 cm/s AV VTI:            0.265 m AV Peak Grad:      9.5 mmHg AV Mean Grad:      5.0 mmHg LVOT Vmax:         90.70 cm/s LVOT Vmean:        62.400 cm/s LVOT VTI:          0.179 m LVOT/AV VTI ratio: 0.68  AORTA Ao Root diam: 3.60 cm Ao Asc diam:  3.00 cm MITRAL VALVE               TRICUSPID VALVE MV Area (PHT): 3.68 cm    TR Peak grad:   34.6 mmHg MV Decel Time: 206 msec    TR Vmax:        294.00 cm/s MV E velocity: 74.90 cm/s MV A velocity: 94.30 cm/s  SHUNTS MV E/A ratio:  0.79        Systemic  VTI:  0.18 m                             Systemic Diam: 2.20 cm Aditya Sabharwal Electronically signed by Ria Commander Signature Date/Time: 03/15/2023/10:55:31 AM    Final    CT ABDOMEN PELVIS WO CONTRAST Result Date: 03/15/2023 CLINICAL DATA:  Abdominal pain. Acute decrease in hemoglobin with tender abdomen. Evaluate for hematoma EXAM: CT ABDOMEN AND PELVIS WITHOUT CONTRAST TECHNIQUE: Multidetector CT imaging of the abdomen and pelvis was performed following the standard protocol without IV contrast. RADIATION DOSE REDUCTION: This exam was performed according to the departmental dose-optimization program which includes automated exposure control, adjustment of the mA and/or kV according to patient size and/or use of iterative reconstruction technique. COMPARISON:  03/14/2023 FINDINGS: Lower chest: Dependent collapse/consolidation in both lower lobes is progressive in the interval with areas of peripheral airway impaction in the dependent lower lungs. Hepatobiliary: No suspicious focal abnormality in the liver on this study without intravenous contrast. Cholecystectomy. No intrahepatic or extrahepatic biliary dilation. Pancreas: No focal mass lesion. No dilatation of the main duct. No intraparenchymal cyst. Diffuse atrophy. Spleen: No splenomegaly. No suspicious focal mass lesion. Adrenals/Urinary Tract: No adrenal nodule or mass. Small nonobstructing stones are evident in both kidneys. High attenuation in the renal pelvis of each kidney in the ureters and the bladder lumen is compatible with excreted contrast from yesterday's CT scan. No hydroureteronephrosis. Bladder lumen opacified but otherwise unremarkable. Stomach/Bowel: Stomach is unremarkable. No gastric wall thickening. No evidence of outlet obstruction. Duodenum is normally positioned as is the ligament of Treitz. No small bowel wall thickening. No small bowel dilatation. The terminal ileum is normal. The appendix is normal. The tip of the appendix appears to be  filled with soft tissue (51/3) without distension. This finding is stable comparing back to a CT from 03/24/2021 but appears minimally progressive when comparing to 09/12/2020. No gross colonic mass. No colonic wall thickening. Moderate to large stool volume evident. Vascular/Lymphatic: There is moderate atherosclerotic calcification of the abdominal aorta without aneurysm. There is no gastrohepatic or hepatoduodenal ligament lymphadenopathy. No retroperitoneal or mesenteric lymphadenopathy. No pelvic sidewall lymphadenopathy. 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 Other: No intraperitoneal free fluid. Musculoskeletal: No pelvic sidewall or retroperitoneal hematoma. There is some minimal thickening of the right posterior renal fascia, stable to minimally progressive since 03/14/2023, but this is only minimal and is nonspecific, not representing a discrete hematoma or substantial hemorrhage. Lumbar fusion hardware evident. The patient is noted to have asymmetry of the gluteus musculature inferiorly with asymmetric enlargement on the left although this is stable to minimally increased compared to 03/14/2023 and has been incompletely visualized. IMPRESSION: 1. No pelvic sidewall or retroperitoneal hematoma. There is some minimal thickening of the right posterior renal fascia, stable to minimally progressive since 03/14/2023, but this is only minimal and is nonspecific, not representing a discrete hematoma or substantial hemorrhage. 2. Asymmetry of the gluteus musculature inferiorly with asymmetric enlargement on the left although this is stable to may be minimally increased compared to 03/14/2023 and has been incompletely visualized. There is not a discrete hematoma a discernible on this study but a component of intramuscular hemorrhage could have this appearance. This would likely be amenable to clinical inspection. 3. Dependent collapse/consolidation in both lower lobes is progressive in the  interval with areas of peripheral airway impaction in the dependent lower lungs. Imaging features compatible may be infectious although aspiration could have  this appearance. 4. The tip of the appendix appears to be filled with soft tissue without distension. This finding is stable comparing back to a CT from 03/24/2021 but appears minimally progressive when comparing to 09/12/2020. This is a nonspecific finding and may be related to mucocele. Follow-up CT in 6 months recommended. Non emergent follow-up surgical consultation recommended. 5. Small nonobstructing stones in both kidneys. 6.  Aortic Atherosclerosis (ICD10-I70.0). Electronically Signed   By: Camellia Candle M.D.   On: 03/15/2023 06:24   MR BRAIN WO CONTRAST Result Date: 03/15/2023 CLINICAL DATA:  Stroke/TIA.  Status post T NK. EXAM: MRI HEAD WITHOUT CONTRAST TECHNIQUE: Multiplanar, multiecho pulse sequences of the brain and surrounding structures were obtained without intravenous contrast. COMPARISON:  01/18/2023 FINDINGS: Brain: No acute infarct, mass effect or extra-axial collection. 3-5 chronic microhemorrhages in the cerebellum. There is multifocal hyperintense T2-weighted signal within the white matter. Parenchymal volume and CSF spaces are normal. The midline structures are normal. Vascular: Normal flow voids. Skull and upper cervical spine: Posterior scalp subgaleal collection. Sinuses/Orbits:No paranasal sinus fluid levels or advanced mucosal thickening. No mastoid or middle ear effusion. Normal orbits. IMPRESSION: 1. No acute intracranial abnormality. 2. Findings of chronic small vessel ischemia. 3. Posterior scalp subgaleal hematoma. Electronically Signed   By: Franky Stanford M.D.   On: 03/15/2023 00:01   CT HEAD WO CONTRAST ( ) Result Date: 03/14/2023 CLINICAL DATA:  Stroke, follow-up. Persistent right upper extremity ataxia and right lower extremity dysmetria. Status post TNK. EXAM: CT HEAD WITHOUT CONTRAST TECHNIQUE: Contiguous axial  images were obtained from the base of the skull through the vertex without intravenous contrast. RADIATION DOSE REDUCTION: This exam was performed according to the departmental dose-optimization program which includes automated exposure control, adjustment of the mA and/or kV according to patient size and/or use of iterative reconstruction technique. COMPARISON:  CT head and CT angio head and neck 03/14/2023 at 12:15 a.m. FINDINGS: Brain: Mild atrophy and white matter disease is stable. No acute infarct or hemorrhage is present. Deep brain nuclei are within normal limits. The ventricles are proportionate to the degree of atrophy. No significant extraaxial fluid collection is present. The brainstem and cerebellum are within normal limits. Midline structures are within normal limits. Vascular: Atherosclerotic calcifications are again noted within the cavernous internal carotid arteries bilaterally and at the dural margin of the right vertebral artery. No hyperdense vessel is present. Skull: A left parietal and occipital scalp hematoma is new. No acute fracture is present. Sinuses/Orbits: The paranasal sinuses and mastoid air cells are clear. Bilateral lens replacements are noted. Globes and orbits are otherwise unremarkable. IMPRESSION: 1. No acute intracranial abnormality or significant interval change. 2. Stable atrophy and white matter disease. This likely reflects the sequela of chronic microvascular ischemia. 3. New left parietal and occipital scalp hematoma without underlying fracture. Electronically Signed   By: Lonni Necessary M.D.   On: 03/14/2023 17:54   CT CHEST ABDOMEN PELVIS W CONTRAST Result Date: 03/14/2023 CLINICAL DATA:  Chest trauma, blunt Abdominal trauma, blunt. Stroke, fall. EXAM: CT CHEST, ABDOMEN, AND PELVIS WITH CONTRAST TECHNIQUE: Multidetector CT imaging of the chest, abdomen and pelvis was performed following the standard protocol during bolus administration of intravenous contrast.  RADIATION DOSE REDUCTION: This exam was performed according to the departmental dose-optimization program which includes automated exposure control, adjustment of the mA and/or kV according to patient size and/or use of iterative reconstruction technique. CONTRAST:  75mL OMNIPAQUE  IOHEXOL  350 MG/ML SOLN COMPARISON:  CT angio chest 11/18/2022, CT angio chest  03/18/2021 FINDINGS: CHEST: Cardiovascular: No aortic injury. The thoracic aorta is normal in caliber. The heart is normal in size. No significant pericardial effusion. Moderate atherosclerotic plaque. At least 2 vessel coronary artery calcification. The main pulmonary artery is normal in caliber. No central or segmental pulmonary embolus. Limited evaluation more distally due to timing of contrast and motion artifact. Mediastinum/Nodes: No pneumomediastinum. No mediastinal hematoma. The esophagus is unremarkable. The thyroid  is unremarkable. The central airways are patent. No mediastinal, hilar, or axillary lymphadenopathy. Lungs/Pleura: Bilateral lower lobe bronchial wall thickening. Bilateral lower lobe mucous plugging, right greater than left. Bibasilar atelectasis. Left lower lobe ground-glass airspace opacity. Stable left lower lobe calcified pulmonary micronodule-no further follow-up indicated. No pulmonary mass. No pulmonary contusion or laceration. No pneumatocele formation. No pleural effusion. No pneumothorax. No hemothorax. Musculoskeletal/Chest wall: No chest wall mass. No acute rib or sternal fracture. No spinal fracture. ABDOMEN / PELVIS: Hepatobiliary: Not enlarged. No focal lesion. No laceration or subcapsular hematoma. Status post cholecystectomy.  No biliary ductal dilatation. Pancreas: Normal pancreatic contour. No main pancreatic duct dilatation. Spleen: Not enlarged. No focal lesion. No laceration, subcapsular hematoma, or vascular injury. Adrenals/Urinary Tract: No nodularity bilaterally. Bilateral kidneys enhance and excrete symmetrically.  No hydronephrosis. No contusion, laceration, or subcapsular hematoma. No injury to the vascular structures or collecting systems. No hydroureter. The urinary bladder is unremarkable. Stomach/Bowel: No small or large bowel wall thickening or dilatation. Stool throughout the colon. The appendix is unremarkable. Vasculature/Lymphatics: Severe atherosclerotic plaque no abdominal aorta or iliac aneurysm. No active contrast extravasation or pseudoaneurysm. No abdominal, pelvic, inguinal lymphadenopathy. Reproductive: Radiation seeds along the prostate. Other: No simple free fluid ascites. No pneumoperitoneum. No hemoperitoneum. No mesenteric hematoma identified. No organized fluid collection. Musculoskeletal: No significant soft tissue hematoma. No acute pelvic fracture. No spinal fracture. L2-S2 posterolateral interbody surgical hardware fusion. Ports and Devices: None. IMPRESSION: 1. No acute intrathoracic, intra-abdominal, intrapelvic traumatic injury. 2. No acute fracture or traumatic malalignment of the thoracic or lumbar spine. 3. Other imaging findings of potential clinical significance: Bilateral lower lobe bronchial wall thickening with bilateral mucous plugging and patchy ground-glass airspace opacity left lower lobe-question developing infection/inflammation. Stool throughout the colon-correlate for constipation. Aortic Atherosclerosis (ICD10-I70.0)-severe. Electronically Signed   By: Morgane  Naveau M.D.   On: 03/14/2023 03:34   DG Chest Port 1 View Result Date: 03/14/2023 CLINICAL DATA:  Recent fall EXAM: PORTABLE CHEST 1 VIEW COMPARISON:  11/01/2022 FINDINGS: Cardiac shadows within normal limits. Aortic calcifications are noted. Skin fold is noted over the right chest. Minimal left basilar atelectasis is seen. No focal confluent infiltrate is noted. No bony abnormality is noted. IMPRESSION: Mild left basilar atelectasis. Electronically Signed   By: Oneil Devonshire M.D.   On: 03/14/2023 00:58   CT ANGIO  HEAD NECK W WO CM (CODE STROKE) Result Date: 03/14/2023 CLINICAL DATA:  Right arm weakness, right pupil blown, head trauma EXAM: CT ANGIOGRAPHY HEAD AND NECK WITH AND WITHOUT CONTRAST TECHNIQUE: Multidetector CT imaging of the head and neck was performed using the standard protocol during bolus administration of intravenous contrast. Multiplanar CT image reconstructions and MIPs were obtained to evaluate the vascular anatomy. Carotid stenosis measurements (when applicable) are obtained utilizing NASCET criteria, using the distal internal carotid diameter as the denominator. RADIATION DOSE REDUCTION: This exam was performed according to the departmental dose-optimization program which includes automated exposure control, adjustment of the mA and/or kV according to patient size and/or use of iterative reconstruction technique. CONTRAST:  75mL OMNIPAQUE  IOHEXOL  350 MG/ML SOLN COMPARISON:  09/13/2022 CTA head and neck, correlation is made with 03/14/2023 CT head FINDINGS: CT HEAD FINDINGS For noncontrast findings, please see same day CT head. CTA NECK FINDINGS Aortic arch: Standard branching. Imaged portion shows no evidence of aneurysm or dissection. No significant stenosis of the major arch vessel origins. Aortic atherosclerosis Right carotid system: No evidence of dissection, occlusion, or hemodynamically significant stenosis (greater than 50%). Left carotid system: No evidence of dissection, occlusion, or hemodynamically significant stenosis (greater than 50%). Vertebral arteries: Moderate stenosis at the origin of the left vertebral artery. Evaluation of the proximal left vertebral artery is limited by beam hardening artifact from the adjacent contrast bolus. The left vertebral artery is otherwise patent to the skull base. Mild stenosis at the origin of the right vertebral artery. The right vertebral artery is otherwise patent to the skull base. No evidence of dissection. Skeleton: No acute osseous abnormality.  Degenerative changes in the cervical spine. Other neck: No acute finding. Upper chest: No focal pulmonary opacity or pleural effusion. Review of the MIP images confirms the above findings CTA HEAD FINDINGS Anterior circulation: Both internal carotid arteries are patent to the termini, with mild stenosis in the bilateral cavernous segments and moderate to severe stenosis in the proximal supraclinoid segments bilaterally. A1 segments patent. Normal anterior communicating artery. Anterior cerebral arteries are patent to their distal aspects without significant stenosis. No M1 stenosis or occlusion. MCA branches are irregular but perfused to their distal aspects without significant stenosis. Posterior circulation: Vertebral arteries patent to the vertebrobasilar junction, with mild stenosis in the proximal right V4. The left vertebral artery primarily supplies the left PICA, and is quite diminutive after the PICA takeoff. Posterior inferior cerebellar arteries patent proximally. Basilar patent to its distal aspect without significant stenosis. Superior cerebellar arteries patent proximally. 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 Redemonstrated severe stenosis in the proximal left P2 (series 7, images 98-99), with multifocal irregularity in the remainder of the left P2. Evaluation of the more distal PCAs is limited by venous contamination. The bilateral posterior communicating arteries are not definitively seen. Venous sinuses: As permitted by contrast timing, patent. Anatomic variants: None significant. No evidence of aneurysm or vascular malformation. Review of the MIP images confirms the above findings IMPRESSION: 1. No intracranial large vessel occlusion. 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 2. 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 3. 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 4. Moderate stenosis at the origin of the left vertebral artery and mild stenosis at the origin of the right vertebral artery. 5. Aortic atherosclerosis. Aortic Atherosclerosis (ICD10-I70.0). Electronically Signed   By: Donald Campion M.D.   On: 03/14/2023 00:46   CT Cervical Spine Wo Contrast Result Date: 03/14/2023 CLINICAL DATA:  Neck trauma, fell backwards EXAM: CT CERVICAL SPINE WITHOUT CONTRAST TECHNIQUE: Multidetector CT imaging of the cervical spine was performed without intravenous contrast. Multiplanar CT image reconstructions were also generated. RADIATION DOSE REDUCTION: This exam was performed according to the departmental dose-optimization program which includes automated exposure control, adjustment of the mA and/or kV according to patient size and/or use of iterative reconstruction technique. COMPARISON:  02/21/2023 CT cervical spine FINDINGS: Alignment: No traumatic listhesis. Straightening of the  normal cervical lordosis. Trace stepwise anterolisthesis of C3 on C4 and C4 on C5. Skull base and vertebrae: No acute fracture. No primary bone lesion or focal pathologic process. Soft tissues and spinal canal: No prevertebral fluid or swelling. No visible canal hematoma. Disc levels: Degenerative changes in the cervical spine. No high-grade spinal canal stenosis. Upper chest: Negative. IMPRESSION: No acute fracture or traumatic listhesis in the cervical spine. Electronically Signed   By: Donald Campion M.D.   On: 03/14/2023 00:34   CT HEAD CODE STROKE WO CONTRAST Result Date: 03/14/2023 CLINICAL DATA:  Code stroke. Right arm weakness, right pupil blown, trauma to posterior head after fall EXAM: CT HEAD WITHOUT CONTRAST TECHNIQUE: Contiguous axial images were obtained from the  base of the skull through the vertex without intravenous contrast. RADIATION DOSE REDUCTION: This exam was performed according to the departmental dose-optimization program which includes automated exposure control, adjustment of the mA and/or kV according to patient size and/or use of iterative reconstruction technique. COMPARISON:  02/21/2023 CT head FINDINGS: Brain: No evidence of acute infarction, hemorrhage, mass, mass effect, or midline shift. No hydrocephalus or extra-axial collection. Basal ganglia calcifications. Normal cerebral volume. Vascular: No hyperdense vessel. Atherosclerotic calcifications in the intracranial carotid and vertebral arteries. Skull: Negative for fracture or focal lesion. Left occipital scalp hematoma. Sinuses/Orbits: No acute finding. Other: The mastoid air cells are well aerated. ASPECTS [facility] Stroke Program Early CT Score) - Ganglionic level infarction (caudate, lentiform nuclei, internal capsule, insula, M1-M3 cortex): 7 - Supraganglionic infarction (M4-M6 cortex): 3 Total score (0-10 with 10 being normal): 10 IMPRESSION: 1. No acute intracranial process. ASPECTS is 10. 2. Left occipital scalp hematoma. Imaging results were communicated on 03/14/2023 at 12:21 am to provider Dr. Voncile via secure text paging. Electronically Signed   By: Donald Campion M.D.   On: 03/14/2023 00:21   CT Cervical Spine Wo Contrast Result Date: 02/21/2023 CLINICAL DATA:  83 year old male status post fall yesterday, legs gave out. Abrasions. Blurred vision. EXAM: CT CERVICAL SPINE WITHOUT CONTRAST TECHNIQUE: Multidetector CT imaging of the cervical spine was performed without intravenous contrast. Multiplanar CT image reconstructions were also generated. RADIATION DOSE REDUCTION: This exam was performed according to the departmental dose-optimization program which includes automated exposure control, adjustment of the mA and/or kV according to patient size and/or use of iterative reconstruction  technique. COMPARISON:  CT head and face today.  Cervical spine CT 04/06/2022. FINDINGS: Alignment: Chronic straightening but mildly increased reversal of cervical lordosis now. Cervicothoracic junction alignment is within normal limits. Mild chronic degenerative appearing anterolisthesis at both C3-C4 and C4-C5 with chronic facet degeneration. Bilateral posterior element alignment is within normal limits. Skull base and vertebrae: Bone mineralization is within normal limits. Visualized skull base is intact. No atlanto-occipital dissociation. C1 and C2 appear intact and aligned. No acute osseous abnormality identified. Soft tissues and spinal canal: No prevertebral fluid or swelling. No visible canal hematoma. Negative visible noncontrast neck soft tissues aside from carotid atherosclerosis. 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 Upper chest: Negative. IMPRESSION: 1. No acute traumatic injury identified in the cervical spine. 2. 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 Electronically Signed   By: VEAR Hurst M.D.   On: 02/21/2023 09:15   CT Maxillofacial Wo Contrast Result Date: 02/21/2023 CLINICAL DATA:  83 year old male status post fall yesterday, legs gave out. Abrasions. Blurred vision. EXAM: CT MAXILLOFACIAL WITHOUT CONTRAST TECHNIQUE: Multidetector CT imaging  of the maxillofacial structures was performed. Multiplanar CT image reconstructions were also generated. RADIATION DOSE REDUCTION: This exam was performed according to the departmental dose-optimization program which includes automated exposure control, adjustment of the mA and/or kV according to patient size and/or use of iterative reconstruction technique. COMPARISON:  CT head and cervical spine today. Prior face CT 11/02/2022. FINDINGS: Osseous: Mandible intact and  normally located. Stable dentition. Bilateral maxilla, zygoma, pterygoid and nasal bones appear stable and intact. Central skull base appears intact. Orbits: No acute orbital wall fracture. Chronic postoperative changes to the globes. No acute orbit soft tissue finding. Sinuses: Visualized paranasal sinuses and mastoids are stable and well aerated. Soft tissues: Negative visible noncontrast larynx, pharynx, parapharyngeal spaces, retropharyngeal space, sublingual space. Partial submandibular gland atrophy is stable. Masticator and parotid spaces appear negative. Calcified atherosclerosis. No soft tissue gas or superficial soft tissue injury identified other than the right forehead reported separately. Limited intracranial: Stable to that reported separately. IMPRESSION: No acute traumatic injury identified in the Face. Electronically Signed   By: VEAR Hurst M.D.   On: 02/21/2023 09:13   CT Head Wo Contrast Result Date: 02/21/2023 CLINICAL DATA:  83 year old male status post fall yesterday, legs gave out. Abrasions. Blurred vision. EXAM: CT HEAD WITHOUT CONTRAST TECHNIQUE: Contiguous axial images were obtained from the base of the skull through the vertex without intravenous contrast. RADIATION DOSE REDUCTION: This exam was performed according to the departmental dose-optimization program which includes automated exposure control, adjustment of the mA and/or kV according to patient size and/or use of iterative reconstruction technique. COMPARISON:  Brain MRI 01/18/2023 and earlier. Face and cervical spine CT today reported separately. FINDINGS: Brain: Stable cerebral volume. Stable basal ganglia vascular calcifications. No midline shift, mass effect, No midline shift, ventriculomegaly, mass effect, evidence of mass lesion, intracranial hemorrhage or evidence of cortically based acute infarction. Stable gray-white matter differentiation throughout the brain. Mild for age white matter hypodensity. Vascular: Calcified  atherosclerosis at the skull base. No suspicious intracranial vascular hyperdensity. Skull: Stable.  No fracture identified. Sinuses/Orbits: Visualized paranasal sinuses and mastoids are stable and well aerated. Other: Mild right forehead scalp hematoma or contusion series 3, image 18. Underlying right frontal bone intact. No scalp soft tissue gas. IMPRESSION: 1. Mild right forehead scalp hematoma or contusion. No skull fracture. 2. No acute intracranial abnormality. Stable mild for age chronic cerebral white matter changes. Electronically Signed   By: VEAR Hurst M.D.   On: 02/21/2023 09:09   PHYSICAL EXAM  Temp:  [97.3 F (36.3 C)-100.6 F (38.1 C)] 97.3 F (36.3 C) (01/02 1556) Pulse Rate:  [65-99] 78 (01/02 1556) Resp:  [16-20] 17 (01/02 1556) BP: (119-147)/(61-72) 147/68 (01/02 1556) SpO2:  [95 %-100 %] 100 % (01/02 1556)  General - Well nourished, well developed, in no apparent distress.  Coughing frequently.   Cardiovascular - Regular rhythm and rate.  Mental Status -  Level of arousal and orientation to time, place, and person were intact. Language including expression, naming, repetition, comprehension was assessed and found intact.  Cranial Nerves II - XII - II - VFF (glasses at baseline) III, IV, VI - EOMI V - Facial sensation intact bilaterally VII - Facial movement symmetrical VIII - Hearing & vestibular intact bilaterally. X - Palate elevates symmetrically.  Phonation intact XI - Chin turning & shoulder shrug intact bilaterally. XII - Tongue protrusion intact.  Motor Strength - Some mild decreased strength in the right lower extremity compared to the left this morning.  Both lower  extremities are able to elevate antigravity briefly, left sustained antigravity position with right lower extremity drift.   Sensory - Light touch symmetrical throughout.   Coordination - Decreased RUE fine motor movements.  Tremor was absent.  Gait and Station -  deferred.  ASSESSMENT/PLAN Harry Zamora is a 83 y.o. male with history of HTN, HLD, DM, prostate Ca, LBP admitted for fell at home, right sided weakness ataxia. TNK was given.    Stroke like episode treated with IV TNK: Unwitnessed fall with concussion and transient right-sided weakness not clear if related to syncope or orthostatic hypotension CT no acute finding CTA head and neck b/l P1/P2 severe stenosis. 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 Left VA origin moderate stenosis MRI no acute infarct 2D Echo: LVEF 55 to 60%, LDL 19 HgbA1c: 7.5 SCDs for VTE prophylaxis clopidogrel  75 mg daily prior to admission, now on No antithrombotic due to negative MRI and acute anemia (improved from yesterday) Ongoing aggressive stroke risk factor management Therapy recommendations:  CIR Disposition:  pending CIR admission   Hx of hypertension Orthostatic hypotension Stable on the low end Orthostatic vitals - supine 122/59, sitting 85/49. standing 66/49 but asymptomaticd Encourage po intake Continue TED hose Continue midodrine  5mg  tid from 8am to 5pm Off IVF BP goal < 180/105 Long term BP goal normotensive  Acute anemia  Scalp laceration with prior bleeding Fall with scalp laceration -> bleeding after TNK -> now stopped Hb 11.1->8.0->8.1->8.7->8.2 CT C/A/P x 2 - No acute intrathoracic, intra-abdominal, intrapelvic traumatic injury. No pelvic sidewall or retroperitoneal hematoma. Asymmetry of the gluteus musculature inferiorly with asymmetric enlargement on the left - no hematoma on exam Continue CBC monitoring  Diabetes HgbA1c 7.5 goal < 7.0 Uncontrolled CBG monitoring SSI DM education and close PCP follow up  Hyperlipidemia Home meds:  crestor  40  LDL 19, goal < 70 Now decreased to Crestor  20 Continue statin at discharge  Other Stroke Risk Factors Advanced age  Other Active Problems LBP Prostate cancer  Hospital day # 4  Patient presented with a fall with some  transient weakness and was given TNK.  MRI was negative for stroke.  He still requiring some help with walking and balance and fell check MRI of C-spine.  Discussed with Dr. Cornelio rehab MD.  Greater than 50% time during this 35-minute visit was spent in counseling and coordination of care and discussion patient and care team and answering questions. Eather Popp, MD Medical Director [facility] Pager: [phone] 03/18/2023 5:00 PM  To contact Stroke Continuity provider, please refer to [email or web address]. After hours, contact General Neurology

## 2023-03-18 NOTE — Consult Note (Signed)
 Physical Medicine and Rehabilitation Consult Reason for Consult:CIR/[place] compression/contusion Referring Physician: Dr Rosemarie   HPI: Harry Zamora is a 83 y.o.  R handed male with hx of Prostate CA in 2003; DM with A1c of 7.5; admitted after fall at home and R sided weakness- - fall was thought to be due to orthostatic hypotension? Vs Syncope? Pt was worked up for CVA and actually given TNK because Sx's so strongly related to stroke- but MRI of head was normal.   Pt reports has been treated for influenza- and been coughing since right before admission. Still coughing and requiring 1L O2 by [place] which is new.   Pt still requiring mod A to walk a few steps and perform ADLs- which is unlike him even though weakness has gotten better. He's also having urinary and bowel incontinence that he's not even aware for past few days.  No reason determined so far.   Also found to have a drop in Hb from 11 to 8 in last few days for unknown reason.    Social Hx: Pt reports wife in good health and used cane to walk outside the house and was driving. Used to smoke but been  a long time.    Review of Systems  Constitutional:  Positive for malaise/fatigue.  HENT:  Positive for hearing loss.   Eyes: Negative.   Respiratory:  Positive for cough, sputum production and wheezing. Negative for hemoptysis.   Cardiovascular:  Negative for chest pain, palpitations and leg swelling.  Gastrointestinal:  Negative for constipation, diarrhea, nausea and vomiting.       Bowel incontinence  Genitourinary:  Positive for urgency.       Bladder incontinence  Musculoskeletal:  Positive for falls and neck pain.  Skin: Negative.   Neurological:  Positive for weakness.  Endo/Heme/Allergies: Negative.   Psychiatric/Behavioral: Negative.    All other systems reviewed and are negative.  Past Medical History:  Diagnosis Date   Anemia    as an infant   Arthritis    Back pain    Depression    Disc displacement,  lumbar    Gynecomastia    History of kidney stones    Hyperlipidemia    Hypertension    no longer on medications   Hypertriglyceridemia    Insomnia    Low back pain    Lumbar radiculopathy    Lumbar stenosis    OA (osteoarthritis)    Polyneuropathy in diabetes(357.2)    Prostate cancer (HCC) 2003   Restless legs    Ringing in ears    RLS (restless legs syndrome)    Type 2 diabetes mellitus (HCC)    Past Surgical History:  Procedure Laterality Date   ABDOMINAL EXPOSURE N/A 07/13/2019   Procedure: ABDOMINAL EXPOSURE;  Surgeon: Serene Gaile LELON, MD;  Location: [facility] OR;  Service: Vascular;  Laterality: N/A;  anterior approach   ANTERIOR LAT LUMBAR FUSION Right 07/13/2019   Procedure: Right Lumbar 3-4 Lumbar 4-5 Anterolateral lumbar interbody fusion;  Surgeon: Unice Pac, MD;  Location: [facility] OR;  Service: Neurosurgery;  Laterality: Right;   ANTERIOR LUMBAR FUSION N/A 07/13/2019   Procedure: Lumbar Five-Sacral One Anterior lumbar interbody fusion;  Surgeon: Unice Pac, MD;  Location: [facility] OR;  Service: Neurosurgery;  Laterality: N/A;  Anterior approach   APPLICATION OF INTRAOPERATIVE CT SCAN N/A 04/24/2021   Procedure: APPLICATION OF INTRAOPERATIVE CT SCAN;  Surgeon: Dawley, Lani BROCKS, DO;  Location: [facility] OR;  Service: Neurosurgery;  Laterality: N/A;  3C/RM 21   CHOLECYSTECTOMY     COLONOSCOPY     CORONARY STENT INTERVENTION N/A 10/01/2020   Procedure: CORONARY STENT INTERVENTION;  Surgeon: Elmira Newman PARAS, MD;  Location: [facility] INVASIVE CV LAB;  Service: Cardiovascular;  Laterality: N/A;   EYE SURGERY Bilateral    GALLBLADDER SURGERY     HEMORRHOID SURGERY  1983   LEFT HEART CATH AND CORONARY ANGIOGRAPHY N/A 09/17/2020   Procedure: LEFT HEART CATH AND CORONARY ANGIOGRAPHY;  Surgeon: Elmira Newman PARAS, MD;  Location: [facility] INVASIVE CV LAB;  Service: Cardiovascular;  Laterality: N/A;   LUMBAR LAMINECTOMY/DECOMPRESSION MICRODISCECTOMY N/A 10/02/2016   Procedure: L3 to S1 Laminectomy;  Surgeon:  Unice Pac, MD;  Location: [facility] OR;  Service: Neurosurgery;  Laterality: N/A;  L3 to S1 Laminectomy   LUMBAR LAMINECTOMY/DECOMPRESSION MICRODISCECTOMY Bilateral 09/07/2019   Procedure: Bilateral Lumbar Five - Sacral One Foraminotomy;  Surgeon: Unice Pac, MD;  Location: [facility] OR;  Service: Neurosurgery;  Laterality: Bilateral;  posterior   LUMBAR PERCUTANEOUS PEDICLE SCREW 3 LEVEL N/A 07/13/2019   Procedure: Percutaneous pedicle screw fixation from Lumbar 3 to Sacral 1;  Surgeon: Unice Pac, MD;  Location: [facility] OR;  Service: Neurosurgery;  Laterality: N/A;   MENISCUS REPAIR Right    PROSTATE SURGERY     SHOULDER SURGERY Left 01/2011   rotator cuff   Family History  Problem Relation Age of Onset   Arthritis Mother    Hypertension Mother    Diabetes Mother    Heart attack Mother    Cancer Father    Social History:  reports that he has been smoking cigars. He has never used smokeless tobacco. He reports that he does not currently use alcohol. He reports that he does not use drugs. Allergies:  Allergies  Allergen Reactions   Atorvastatin Other (See Comments)   Niacin     Other Reaction(s): break out in hives   Levaquin [Levofloxacin In D5w] Rash   Niaspan [Niacin Er (Antihyperlipidemic)] Rash and Other (See Comments)    Flushing reaction   Tramadol Rash   Medications Prior to Admission  Medication Sig Dispense Refill   ascorbic acid  (VITAMIN C ) 500 MG tablet Take 500 mg by mouth daily.     Calcium  Carb-Cholecalciferol  (CALCIUM  1000 + D PO) Take by mouth.     clopidogrel  (PLAVIX ) 75 MG tablet Take 1 tablet (75 mg total) by mouth daily. **Restart Plavix  on 04/29/2021** 90 tablet 3   cyanocobalamin  1000 MCG tablet Take 1,000 mcg by mouth daily.     Dulaglutide  (TRULICITY ) 3 MG/0.5ML SOAJ Inject 3 mg into the skin once a week. (Patient taking differently: Inject 3 mg into the skin once a week. Inject on Wednesday) 12 mL 3   DULoxetine  (CYMBALTA ) 30 MG capsule Take 30 mg by mouth daily.      ferrous sulfate  325 (65 FE) MG EC tablet Take 325 mg by mouth daily with breakfast.     gabapentin  (NEURONTIN ) 100 MG capsule Take 100 mg by mouth at bedtime.     insulin  glargine-yfgn (SEMGLEE) 100 UNIT/ML injection Inject 18 Units into the skin daily.     Krill Oil 500 MG CAPS Take 1 capsule by mouth daily.     Lysine 500 MG TABS Take 1 tablet by mouth daily.     meloxicam  (MOBIC ) 15 MG tablet Take 15 mg by mouth daily.     metFORMIN  (GLUCOPHAGE ) 500 MG tablet Take 2 tablets (1,000 mg total) by mouth 2 (two) times daily with a meal. Resume 10/03/2020  1   Multiple Vitamins-Minerals (MENS 50+ MULTI VITAMIN/MIN) TABS Take 1 tablet by mouth daily.     nitroGLYCERIN  (NITROSTAT ) 0.4 MG SL tablet Place 1 tablet (0.4 mg total) under the tongue every 5 (five) minutes as needed for chest pain. 30 tablet 3   prazosin  (MINIPRESS ) 1 MG capsule Take 1 mg by mouth at bedtime.     rOPINIRole  (REQUIP ) 4 MG tablet Take 4 mg by mouth at bedtime.     rosuvastatin  (CRESTOR ) 40 MG tablet TAKE 1 TABLET(40 MG) BY MOUTH DAILY 90 tablet 3   sildenafil (VIAGRA) 25 MG tablet Take by mouth.     TRESIBA FLEXTOUCH 100 UNIT/ML FlexTouch Pen daily. Inject on Wednesday      Home: Home Living Family/patient expects to be discharged to:: Private residence Living Arrangements: Spouse/significant other Available Help at Discharge: Family, Available PRN/intermittently Type of Home: House Home Access: Stairs to enter Secretary/administrator of Steps: threshhold Entrance Stairs-Rails: None Home Layout: One level Bathroom Shower/Tub: Health Visitor: Standard Home Equipment: Medical Laboratory Scientific Officer - single point, Rollator (4 wheels), Shower seat, Grab bars - tub/shower Additional Comments: has a 84yo old rat terrier named Harry Zamora that he adores  Lives With: Spouse  Functional History: Prior Function Prior Level of Function : Independent/Modified Independent Mobility Comments: ambulatory with SPC vs rollator ADLs Comments:  no assistance. per 2023 chart was driving. pt verbalized in session he drives Functional Status:  Mobility: Bed Mobility Overal bed mobility: Needs Assistance Bed Mobility: Rolling, Supine to Sit, Sit to Supine Rolling: Min assist, Used rails Supine to sit: Mod assist, Used rails, HOB elevated Sit to supine: Min assist, Used rails General bed mobility comments: pt with decreased problem solving with coming to [facility], ultimately needing modA for pulling trunk to upright, min A for returning LE to bed, min A for rolling to place clean pad as pt unaware that he had BM in bed Transfers Overall transfer level: Needs assistance Equipment used: Rolling walker (2 wheels) Transfers: Bed to chair/wheelchair/BSC Sit to Stand: Mod assist Bed to/from chair/wheelchair/BSC transfer type:: Step pivot Step pivot transfers: Mod assist General transfer comment: pt reports no need to use bathroom when he gets up but at PT request pt does agree to getting to [facility], pt barely makes it to [facility] before having BM, mod physical assist for steadying with weightshift onto R LE and maximal cuing for sequencing stand and pivot to and from [facility], Ambulation/Gait Ambulation/Gait assistance: Contact guard assist, Min assist Gait Distance (Feet): 200 Feet Assistive device: None Gait Pattern/deviations: Step-through pattern, Wide base of support, Decreased stride length General Gait Details: Steadying assist provided (pt typically uses Rollator) Gait velocity: reduced Gait velocity interpretation: <1.8 ft/sec, indicate of risk for recurrent falls    ADL:    Cognition: Cognition Overall Cognitive Status: Impaired/Different from baseline Arousal/Alertness: Awake/alert Orientation Level: Oriented X4 Cognition Arousal: Alert Behavior During Therapy: Flat affect Overall Cognitive Status: Impaired/Different from baseline Area of Impairment: Safety/judgement, Awareness Safety/Judgement: Decreased awareness of  deficits Awareness: Emergent General Comments: pt continues to have no awareness of bowels,  Blood pressure (!) 147/68, pulse 78, temperature (!) 97.3 F (36.3 C), temperature source Oral, resp. rate 17, height 6' 2 (1.88 m), weight 67.4 kg, SpO2 100%. Physical Exam Vitals and nursing note reviewed.  Constitutional:      General: He is not in acute distress.    Appearance: He is normal weight.     Comments: Pt awake, alert, not Ox2; wearing O2 which is new 1L  O2 by [place]; NAD- in droplet precaution room  HENT:     Head: Normocephalic and atraumatic.     Right Ear: External ear normal.     Left Ear: External ear normal.     Nose: Nose normal. No congestion.     Mouth/Throat:     Mouth: Mucous membranes are dry.     Pharynx: Oropharynx is clear. No oropharyngeal exudate.  Eyes:     General:        Right eye: No discharge.        Left eye: No discharge.     Extraocular Movements: Extraocular movements intact.  Cardiovascular:     Rate and Rhythm: Normal rate and regular rhythm.     Heart sounds: Normal heart sounds. No murmur heard.    No gallop.  Pulmonary:     Effort: Pulmonary effort is normal. No respiratory distress.     Breath sounds: Normal breath sounds. No wheezing or rales.     Comments: But having coarse/bronchospasm like cough frequently Abdominal:     General: Bowel sounds are normal. There is no distension.     Palpations: Abdomen is soft.     Tenderness: There is no abdominal tenderness.  Musculoskeletal:        General: Normal range of motion.     Comments: RUE- biceps 4+/5; triceps 4/5; WE 4+/5; grip and FA 4+/5 to 5-/5 LUE 5-/5 throughout LEs HF 4+/5; KE/KF/DF and PF 5-/5 B/L Significantly decreased fine motor skills in hands L>R  Skin:    General: Skin is warm and dry.  Neurological:     Mental Status: He is alert.     Comments: Intact to light touch in all 4 extremities- except stocking distribution B/L  Ox2- pleasant  Psychiatric:        Mood and  Affect: Mood normal.        Behavior: Behavior normal.     Results for orders placed or performed during the hospital encounter of 03/14/23 (from the past 24 hours)  Glucose, capillary     Status: Abnormal   Collection Time: 03/17/23  9:55 PM  Result Value Ref Range   Glucose-Capillary 104 (H) 70 - 99 mg/dL   Comment 1 Notify RN   Glucose, capillary     Status: Abnormal   Collection Time: 03/18/23  6:16 AM  Result Value Ref Range   Glucose-Capillary 164 (H) 70 - 99 mg/dL   Comment 1 Notify RN   Basic metabolic panel     Status: Abnormal   Collection Time: 03/18/23  7:28 AM  Result Value Ref Range   Sodium 136 135 - 145 mmol/L   Potassium 3.8 3.5 - 5.1 mmol/L   Chloride 100 98 - 111 mmol/L   CO2 25 22 - 32 mmol/L   Glucose, Bld 146 (H) 70 - 99 mg/dL   BUN 30 (H) 8 - 23 mg/dL   Creatinine, Ser 8.44 (H) 0.61 - 1.24 mg/dL   Calcium  8.3 (L) 8.9 - 10.3 mg/dL   GFR, Estimated 44 (L) >60 mL/min   Anion gap 11 5 - 15  CBC     Status: Abnormal   Collection Time: 03/18/23  7:28 AM  Result Value Ref Range   WBC 6.8 4.0 - 10.5 K/uL   RBC 2.60 (L) 4.22 - 5.81 MIL/uL   Hemoglobin 8.1 (L) 13.0 - 17.0 g/dL   HCT 76.0 (L) 60.9 - 47.9 %   MCV 91.9 80.0 - 100.0 fL   MCH  31.2 26.0 - 34.0 pg   MCHC 33.9 30.0 - 36.0 g/dL   RDW 87.1 88.4 - 84.4 %   Platelets 191 150 - 400 K/uL   nRBC 0.0 0.0 - 0.2 %  Glucose, capillary     Status: Abnormal   Collection Time: 03/18/23 11:55 AM  Result Value Ref Range   Glucose-Capillary 167 (H) 70 - 99 mg/dL   Comment 1 Notify RN   Glucose, capillary     Status: Abnormal   Collection Time: 03/18/23  3:55 PM  Result Value Ref Range   Glucose-Capillary 147 (H) 70 - 99 mg/dL   Comment 1 Notify RN    No results found.   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  Yes Co-Morbidities requiring supervision/potential complications: orthostatic hypotension; influenza; ABLA- with 3 units drop in Hb; dementia with prior MOCA of 20; UE weakness and LB loss of balance Due to bladder management, bowel management, safety, skin/wound care, disease management, medication administration, pain management, and patient education, does the patient require 24 hr/day rehab nursing? Yes Does the patient require coordinated care of a physician, rehab nurse, therapy disciplines of PT and OT to address physical and functional deficits in the context of the above medical diagnosis(es)? Yes Addressing deficits in the following areas: balance, endurance, locomotion, strength, transferring, bowel/bladder control, bathing, dressing, feeding, grooming, and toileting Can the patient actively participate in an intensive therapy program of at least 3 hrs of therapy per day at least 5 days per week? Yes The potential for patient to make measurable gains while on inpatient rehab is good Anticipated functional outcomes upon discharge from inpatient rehab are supervision  with PT, supervision with OT, n/a with SLP. Estimated rehab length of stay to reach the above functional goals is: 7-10 days hopefully Anticipated discharge destination: Home Overall Rehab/Functional Prognosis: good  RECOMMENDATIONS: This patient's condition is appropriate for continued rehabilitative care in the following setting: CIR Patient has agreed to participate in recommended program. Potentially- pt wants to go home directly.  Note that insurance prior authorization may be required for reimbursement for recommended care.  Comment:  Pt seen and I asked Dr Rosemarie for an MRI of cervical spine, due to UE weakness/esp- and since had a fall, concerned he has myelopathy. Appears he at least has contusion C3-C6 based on MRI. Not clear if would need surgery yet- per Neurology/[facility]- could just need rehab.  Pt did not have CVA-  based on brain MRI and improvement in Sx's.   3.  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 If cath volumes >350cc , suggest cathing or placing foley if doesn't improve- cannot add Flomax, since so orthostatic at this point.   4. Will ask admissions coordinator to submit for CIR, since has SCI.   5. Thank you for this consult.     Jaycee Mckellips, MD 03/18/2023    I spent a total of  68   minutes on total care today- >50% coordination of care- due to  Review of chart, d/w admissions coordinator, interview of pt and exam; also d/w Dr Rosemarie about MRI cervical spine- which was ordered and shows contusion- also documentation and reviewing MRI independently.

## 2023-03-18 NOTE — Progress Notes (Signed)
 Inpatient Rehab Admissions Coordinator:   Met with patient at bedside to discuss possible CIR.  He is pleasant and agreeable.  States he is home with a spouse 24/7 and has a son/daughter that live nearby.  When asked about urge/sensation to use restroom pt denies incontinence, though this is well documented in the therapy notes.  Also note decline in mobility this admission-- pt was CGA for 100' with IV pole on admit 12/29 and most recently mod assist with RW to transfer to [facility] on 1/1.  PT notes decline in cognition as well.  Asked rehab MD for formal consult to assess needs/goals and I will f/u tomorrow.   Reche Lowers, PT, DPT Admissions Coordinator [phone] 03/18/23  12:53 PM

## 2023-03-18 NOTE — Progress Notes (Signed)
 MRI C-spine personally reviewed   1. Abnormal cord signal at C3-4 and C4-5 compatible with edema/contusion. No evidence of cord hemorrhage. 2. Suspected small volume epidural hematoma in the upper cervical spine with possible PLL disruption at C2-3. 3. 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  Discussed with Neurosurgery PA Johnanna, they will evaluate patient tomorrow.  Lola Jernigan MD-PhD [facility] [phone]  No charge note

## 2023-03-18 NOTE — Progress Notes (Signed)
Patient off the unit for MRI.

## 2023-03-18 NOTE — Plan of Care (Signed)
  Problem: Education: Goal: Knowledge of disease or condition will improve Outcome: Progressing   Problem: Coping: Goal: Will verbalize positive feelings about self Outcome: Progressing   Problem: Self-Care: Goal: Ability to participate in self-care as condition permits will improve Outcome: Progressing Goal: Ability to communicate needs accurately will improve Outcome: Progressing   Problem: Nutrition: Goal: Risk of aspiration will decrease Outcome: Progressing   Problem: Education: Goal: Knowledge of General Education information will improve Description: Including pain rating scale, medication(s)/side effects and non-pharmacologic comfort measures Outcome: Progressing   Problem: Clinical Measurements: Goal: Ability to maintain clinical measurements within normal limits will improve Outcome: Progressing Goal: Will remain free from infection Outcome: Progressing Goal: Diagnostic test results will improve Outcome: Progressing Goal: Respiratory complications will improve Outcome: Progressing Goal: Cardiovascular complication will be avoided Outcome: Progressing   Problem: Activity: Goal: Risk for activity intolerance will decrease Outcome: Progressing

## 2023-03-18 NOTE — Plan of Care (Signed)
  Problem: Education: Goal: Knowledge of disease or condition will improve Outcome: Progressing   Problem: Coping: Goal: Will identify appropriate support needs Outcome: Progressing   Problem: Nutrition: Goal: Risk of aspiration will decrease Outcome: Progressing Goal: Dietary intake will improve Outcome: Progressing   Problem: Clinical Measurements: Goal: Respiratory complications will improve Outcome: Progressing Goal: Cardiovascular complication will be avoided Outcome: Progressing   Problem: Coping: Goal: Level of anxiety will decrease Outcome: Progressing   Problem: Elimination: Goal: Will not experience complications related to bowel motility Outcome: Progressing   Problem: Skin Integrity: Goal: Risk for impaired skin integrity will decrease Outcome: Progressing   Problem: Skin Integrity: Goal: Risk for impaired skin integrity will decrease Outcome: Progressing

## 2023-03-19 DIAGNOSIS — I639 Cerebral infarction, unspecified: Secondary | ICD-10-CM | POA: Diagnosis not present

## 2023-03-19 LAB — BASIC METABOLIC PANEL
Anion gap: 13 (ref 5–15)
BUN: 33 mg/dL — ABNORMAL HIGH (ref 8–23)
CO2: 22 mmol/L (ref 22–32)
Calcium: 8.4 mg/dL — ABNORMAL LOW (ref 8.9–10.3)
Chloride: 100 mmol/L (ref 98–111)
Creatinine, Ser: 1.5 mg/dL — ABNORMAL HIGH (ref 0.61–1.24)
GFR, Estimated: 46 mL/min — ABNORMAL LOW (ref >60)
Glucose, Bld: 162 mg/dL — ABNORMAL HIGH (ref 70–99)
Potassium: 3.4 mmol/L — ABNORMAL LOW (ref 3.5–5.1)
Sodium: 135 mmol/L (ref 135–145)

## 2023-03-19 LAB — CBC
HCT: 26.6 % — ABNORMAL LOW (ref 39.0–52.0)
Hemoglobin: 8.8 g/dL — ABNORMAL LOW (ref 13.0–17.0)
MCH: 30.8 pg (ref 26.0–34.0)
MCHC: 33.1 g/dL (ref 30.0–36.0)
MCV: 93 fL (ref 80.0–100.0)
Platelets: 256 10*3/uL (ref 150–400)
RBC: 2.86 MIL/uL — ABNORMAL LOW (ref 4.22–5.81)
RDW: 12.8 % (ref 11.5–15.5)
WBC: 11.6 10*3/uL — ABNORMAL HIGH (ref 4.0–10.5)
nRBC: 0 % (ref 0.0–0.2)

## 2023-03-19 LAB — GLUCOSE, CAPILLARY
Glucose-Capillary: 179 mg/dL — ABNORMAL HIGH (ref 70–99)
Glucose-Capillary: 170 mg/dL — ABNORMAL HIGH (ref 70–99)
Glucose-Capillary: 115 mg/dL — ABNORMAL HIGH (ref 70–99)
Glucose-Capillary: 150 mg/dL — ABNORMAL HIGH (ref 70–99)

## 2023-03-19 NOTE — Progress Notes (Signed)
 Writer called and notified ortho tech of patient's c-collar order at phone number [phone]. Per ortho tech, they will be up soon to apply collar.

## 2023-03-19 NOTE — PMR Pre-admission (Signed)
 PMR Admission Coordinator Pre-Admission Assessment  Patient: Harry Zamora is an 83 y.o., male MRN: [id number] DOB: [birth year] Height: 6' 2 (188 cm) Weight: 67.4 kg              Insurance Information HMO: yes    PPO:      PCP:      IPA:      80/20:      OTHER:  PRIMARY: [employer]      Policy#: [id number]      Subscriber: pt CM Name: Leita FALCON      Phone#: [phone] ext [phone]     Fax#: [phone] Pre-Cert#: [id number] auth for CIR from Leita with [employer] with updates due to Norvel SQUIBB (ext [phone]) at fax listed above on 03/29/23      Employer:  Benefits:  Phone #: [phone]     Name:  Eff. Date: 03/17/23     Deduct: $0      Out of Pocket Max: $4000 ($0 met)      Life Max: n/a  CIR:  $160/day for days 1-10     SNF: 20 full days Outpatient:      Co-Pay: $20/visit Home Health: 100%      Co-Pay:  DME: 80%     Co-Pay: 20% Providers:  SECONDARY:       Policy#:       Phone#:   Artist:       Phone#:   [employer] Information Summary" for patients in Inpatient Rehabilitation Facilities with attached "Privacy Act Statement-Health Care Records" was provided and verbally reviewed with: Patient and Family  Emergency Contact Information Contact Information     Name Relation Home Work Mobile   [place] Spouse [phone]     [facility] Daughter [phone]  [phone]   [facility] Other   [phone]   Rollin Czar Other [phone]        Other Contacts   None on File    Current Medical History  Patient Admitting Diagnosis: 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  Per family report, pt has has an increased number of falls in the last year.  Initially felt could be stroke so workup was initiated, pt given TNK. CT/MRI negative.  Hospital course notable for new O2 needs, new bowel/bladder  incontinence, ABLA, hypotension, decline in mobility.  MRI spine revealed cord contusion at C3-5 with small volume epidural hematoma with potential PLL disruption at C2-3.  Neurosurgery was consulted and recommended conservative management with soft collar and f/u in 3 months for imaging.  Pt continues to require 2L O2 via nasal cannula.  Pt has been confused at times.  Therapy evaluations completed and pt was recommended for CIR.    Complete NIHSS TOTAL: 1 Glasgow Coma Scale Score: 15  Patient's medical record from [facility] has been reviewed by the rehabilitation admission coordinator and physician.  Past Medical History  Past Medical History:  Diagnosis Date   Anemia    as an infant   Arthritis    Back pain    Depression    Disc displacement, lumbar    Gynecomastia    History of kidney stones    Hyperlipidemia    Hypertension    no longer on medications   Hypertriglyceridemia    Insomnia    Low back pain    Lumbar  radiculopathy    Lumbar stenosis    OA (osteoarthritis)    Polyneuropathy in diabetes(357.2)    Prostate cancer (HCC) 2003   Restless legs    Ringing in ears    RLS (restless legs syndrome)    Type 2 diabetes mellitus (HCC)     Has the patient had major surgery during 100 days prior to admission? No  Family History  family history includes Arthritis in his mother; Cancer in his father; Diabetes in his mother; Heart attack in his mother; Hypertension in his mother.   Current Medications   Current Facility-Administered Medications:    acetaminophen  (TYLENOL ) tablet 650 mg, 650 mg, Oral, Q4H PRN, 650 mg at 03/19/23 0030 **OR** acetaminophen  (TYLENOL ) 160 MG/5ML solution 650 mg, 650 mg, Per Tube, Q4H PRN **OR** acetaminophen  (TYLENOL ) suppository 650 mg, 650 mg, Rectal, Q4H PRN, Arora, Ashish, MD   Chlorhexidine  Gluconate Cloth 2 % PADS 6 each, 6 each, Topical, Daily, Arora, Ashish, MD, 6 each at 03/16/23 1004   cyanocobalamin  (VITAMIN B12) tablet 1,000 mcg,  1,000 mcg, Oral, Daily, Jerri Pfeiffer, MD, 1,000 mcg at 03/19/23 9185   DULoxetine  (CYMBALTA ) DR capsule 30 mg, 30 mg, Oral, Daily, Jerri Pfeiffer, MD, 30 mg at 03/19/23 9185   ferrous sulfate  tablet 325 mg, 325 mg, Oral, Q breakfast, Jerri Pfeiffer, MD, 325 mg at 03/19/23 9185   gabapentin  (NEURONTIN ) capsule 100 mg, 100 mg, Oral, QHS, Jerri Pfeiffer, MD, 100 mg at 03/18/23 2153   guaiFENesin  (ROBITUSSIN) 100 MG/5ML liquid 5 mL, 5 mL, Oral, Q4H PRN, Bhagat, Srishti L, MD, 5 mL at 03/18/23 2153   insulin  aspart (novoLOG ) injection 0-9 Units, 0-9 Units, Subcutaneous, TID WC, Jerri Pfeiffer, MD, 2 Units at 03/19/23 1127   ipratropium-albuterol  (DUONEB) 0.5-2.5 (3) MG/3ML nebulizer solution 3 mL, 3 mL, Nebulization, Q6H PRN, Bhagat, Srishti L, MD, 3 mL at 03/16/23 9485   lidocaine -EPINEPHrine -tetracaine  (LET) topical gel, 3 mL, Topical, Q12H PRN, Arora, Ashish, MD   midodrine  (PROAMATINE ) tablet 5 mg, 5 mg, Oral, TID WC, Jerri Pfeiffer, MD, 5 mg at 03/19/23 1631   ondansetron  (ZOFRAN ) injection 4 mg, 4 mg, Intravenous, Q6H PRN, Arora, Ashish, MD   Oral care mouth rinse, 15 mL, Mouth Rinse, PRN, Arora, Ashish, MD   pantoprazole  (PROTONIX ) EC tablet 40 mg, 40 mg, Oral, Daily, Jerri Pfeiffer, MD, 40 mg at 03/19/23 9185   rOPINIRole  (REQUIP ) tablet 4 mg, 4 mg, Oral, QHS, Jerri Pfeiffer, MD, 4 mg at 03/18/23 2153   rosuvastatin  (CRESTOR ) tablet 20 mg, 20 mg, Oral, Daily, Jerri Pfeiffer, MD, 20 mg at 03/19/23 9185   senna-docusate (Senokot-S) tablet 1 tablet, 1 tablet, Oral, QHS PRN, Arora, Ashish, MD, 1 tablet at 03/18/23 0905  Patients Current Diet:  Diet Order             Diet heart healthy/carb modified Room service appropriate? Yes with Assist; Fluid consistency: Thin  Diet effective now                   Precautions / Restrictions Precautions Precautions: Fall, Other (comment) Precaution Comments: history of many falls Other Brace: soft cervical collar for comfort Restrictions Weight Bearing Restrictions Per  Provider Order: No   Has the patient had 2 or more falls or a fall with injury in the past year?Yes  Prior Activity Level Limited Community (1-2x/wk): independent with SPC out of the home, no DME in the home, indep with ADLs, per wife has been recommended not to drive by several physicians but continues to  do so.  Prior Functional Level Prior Function Prior Level of Function : Independent/Modified Independent Mobility Comments: ambulatory with SPC vs rollator ADLs Comments: no assistance. per 2023 chart was driving. pt verbalized in session he drives  Self Care: Did the patient need help bathing, dressing, using the toilet or eating?  Independent  Indoor Mobility: Did the patient need assistance with walking from room to room (with or without device)? Independent  Stairs: Did the patient need assistance with internal or external stairs (with or without device)? Independent  Functional Cognition: Did the patient need help planning regular tasks such as shopping or remembering to take medications? Independent  Patient Information Are you of Hispanic, Latino/a,or Spanish origin?: A. No, not of Hispanic, Latino/a, or Spanish origin What is your race?: A. White Do you need or want an interpreter to communicate with a doctor or health care staff?: 0. No  Patient's Response To:  Health Literacy and Transportation Is the patient able to respond to health literacy and transportation needs?: Yes Health Literacy - How often do you need to have someone help you when you read instructions, pamphlets, or other written material from your doctor or pharmacy?: Never In the past 12 months, has lack of transportation kept you from medical appointments or from getting medications?: No In the past 12 months, has lack of transportation kept you from meetings, work, or from getting things needed for daily living?: No  Home Assistive Devices / Equipment Home Equipment: Medical Laboratory Scientific Officer - single point, Rollator (4  wheels), Shower seat, Grab bars - tub/shower  Prior Device Use: Indicate devices/aids used by the patient prior to current illness, exacerbation or injury?  cane  Current Functional Level Cognition  Arousal/Alertness: Awake/alert Overall Cognitive Status: Impaired/Different from baseline Orientation Level: Oriented X4 Safety/Judgement: Decreased awareness of deficits General Comments: pt continues to have no awareness of bowels,    Extremity Assessment (includes Sensation/Coordination)  Upper Extremity Assessment: Right hand dominant, RUE deficits/detail RUE Deficits / Details: pt with decreased R UE flexion to 90 degrees with palm externally rotating. pt with ataxic movement with decreased nose to finger undershooting. pt demonstrates 3+ out of 5 RUE Coordination: decreased fine motor, decreased gross motor  Lower Extremity Assessment: RLE deficits/detail RLE Deficits / Details: noted to have decreased strength compared to the L    ADLs       Mobility  Overal bed mobility: Needs Assistance Bed Mobility: Supine to Sit, Sit to Supine Rolling: Min assist, Used rails Supine to sit: Supervision, Used rails, HOB elevated Sit to supine: Supervision General bed mobility comments: supervision for safety with increased time to complete    Transfers  Overall transfer level: Needs assistance Equipment used: Rolling walker (2 wheels) Transfers: Bed to chair/wheelchair/BSC Sit to Stand: Contact guard assist, Min assist Bed to/from chair/wheelchair/BSC transfer type:: Step pivot Step pivot transfers: Mod assist General transfer comment: min A initially to facilitate anterior momentum, CGA for safety for subsequent stands x3 from EOB    Ambulation / Gait / Stairs / Wheelchair Mobility  Ambulation/Gait Ambulation/Gait assistance: Contact guard assist Gait Distance (Feet): 190 Feet (+ 80) Assistive device: Rolling walker (2 wheels) Gait Pattern/deviations: Step-through pattern, Wide base  of support, Decreased stride length General Gait Details: CGA for safety, no overt LOB, cues for closer RW proximity and upright trunk Gait velocity: reduced Gait velocity interpretation: <1.8 ft/sec, indicate of risk for recurrent falls    Posture / Balance Balance Overall balance assessment: Needs assistance Sitting-balance support: Bilateral upper extremity supported,  Feet supported Sitting balance-Leahy Scale: Fair Standing balance support: Bilateral upper extremity supported, During functional activity, Reliant on assistive device for balance Standing balance-Leahy Scale: Poor Standing balance comment: reliant on UE support during dynamic tasks    Special needs/care consideration Oxygen 2L acutely, Skin scalp lac, and Diabetic management yes     Previous Home Environment (from acute therapy documentation) Living Arrangements: Spouse/significant other  Lives With: Spouse Available Help at Discharge: Family, Available PRN/intermittently Type of Home: House Home Layout: One level Home Access: Stairs to enter Entrance Stairs-Rails: None Entrance Stairs-Number of Steps: threshhold Bathroom Shower/Tub: Health Visitor: Standard Home Care Services: No Additional Comments: has a 84yo old rat terrier named Maggie that he adores  Discharge Living Setting Plans for Discharge Living Setting: Patient's home, Lives with (comment) (spouse) Type of Home at Discharge: House Discharge Home Layout: One level Discharge Home Access: Level entry Discharge Bathroom Shower/Tub: Walk-in shower Discharge Bathroom Toilet: Standard Discharge Bathroom Accessibility: No Does the patient have any problems obtaining your medications?: No  Social/Family/Support Systems Patient Roles: Spouse Anticipated Caregiver: Byran Bilotti (spouse) Anticipated Caregiver's Contact Information: [phone] Ability/Limitations of Caregiver: supervision only Caregiver Availability: 24/7 Discharge  Plan Discussed with Primary Caregiver: Yes Is Caregiver In Agreement with Plan?: Yes Does Caregiver/Family have Issues with Lodging/Transportation while Pt is in Rehab?: No   Goals Patient/Family Goal for Rehab: PT/OT/SLP supervision Expected length of stay: 9-12 days Additional Information: Discharge plan: home with spouse who can provide 24/7 supervision Pt/Family Agrees to Admission and willing to participate: Yes Program Orientation Provided & Reviewed with Pt/Caregiver Including Roles  & Responsibilities: Yes   Decrease burden of Care through IP rehab admission: n/a   Possible need for SNF placement upon discharge: Not anticipated.  Plan d/c home with spouse 24/7 supervision.    Patient Condition: This patient's condition remains as documented in the consult dated 03/17/22, in which the Rehabilitation Physician determined and documented that the patient's condition is appropriate for intensive rehabilitative care in an inpatient rehabilitation facility. Will admit to inpatient rehab today.  Preadmission Screen Completed By:  Reche FORBES Lowers, PT, DPT 03/19/2023 4:36 PM ______________________________________________________________________   Discussed status with Dr. Lovornon 03/23/23 at 11:44 AM  and received approval for admission today.  Admission Coordinator:  Caitlin E Warren, PT, DPT time 11:44 AM Pattricia 03/23/23

## 2023-03-19 NOTE — Plan of Care (Signed)
  Problem: Education: Goal: Knowledge of secondary prevention will improve (MUST DOCUMENT ALL) Outcome: Progressing   Problem: Nutrition: Goal: Risk of aspiration will decrease Outcome: Progressing   Problem: Pain Management: Goal: General experience of comfort will improve Outcome: Progressing   Problem: Education: Goal: Ability to describe self-care measures that may prevent or decrease complications (Diabetes Survival Skills Education) will improve Outcome: Progressing

## 2023-03-19 NOTE — Plan of Care (Signed)
  Problem: Pain Management: Goal: General experience of comfort will improve Outcome: Progressing   Problem: Safety: Goal: Ability to remain free from injury will improve Outcome: Progressing   Problem: Skin Integrity: Goal: Risk for impaired skin integrity will decrease Outcome: Progressing

## 2023-03-19 NOTE — TOC Progression Note (Signed)
 Transition of Care [facility]) - Progression Note    Patient Details  Name: Harry Zamora MRN: [id number] Date of Birth: Nov 22, 1940  Transition of Care [facility] CM/SW Contact  Andrez JULIANNA George, RN Phone Number: 03/19/2023, 3:25 PM  Clinical Narrative:     CIR to follow up with spouse on rehab vs home. TOC following.  Expected Discharge Plan: IP Rehab Facility Barriers to Discharge: Continued Medical Work up  Expected Discharge Plan and Services   Discharge Planning Services: CM Consult Post Acute Care Choice: IP Rehab Living arrangements for the past 2 months: Single Family Home                                       Social Determinants of Health (SDOH) Interventions SDOH Screenings   Food Insecurity: No Food Insecurity (03/15/2023)  Housing: Low Risk  (03/15/2023)  Transportation Needs: No Transportation Needs (03/15/2023)  Utilities: Not At Risk (03/15/2023)  Social Connections: Socially Integrated (03/15/2023)  Tobacco Use: High Risk (03/14/2023)    Readmission Risk Interventions     No data to display

## 2023-03-19 NOTE — Consult Note (Addendum)
 Patient is a 83 y.o. male who presented with increased R sided weakness after a fall, was admitted 03/14/23. Currently denies any significant neck pain, UE/LE symptoms, B&B dysfunction.   Patient Active Problem List   Diagnosis Date Noted   Acute ischemic stroke (HCC) 03/14/2023   Orthostatic hypotension 11/18/2022   Gait abnormality 11/18/2022   Peripheral neuropathy 11/18/2022   Confusion 11/18/2022   Unilateral primary osteoarthritis, left knee 10/01/2021   Postural dizziness with presyncope 08/04/2021   Lumbar spinal stenosis 04/24/2021   Sepsis (HCC) 03/13/2021   AKI (acute kidney injury) (HCC) 03/13/2021   Elevated CK 03/13/2021   Severe sepsis (HCC) 03/13/2021   PNA (pneumonia) 03/12/2021   Restless legs    Acute respiratory failure with hypoxia (HCC)    Coronary artery disease involving native coronary artery of native heart without angina pectoris 09/19/2020   Abnormal stress test 09/16/2020   Hyperkalemia 09/12/2020   Acute renal failure 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) 09/12/2020   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   Type 2 diabetes mellitus (HCC)    Syncope and collapse 08/07/2020   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   PAD (peripheral artery disease) (HCC) 08/30/2019   Spondylolisthesis of lumbar region 07/13/2019   Spinal stenosis of lumbar region with neurogenic claudication 10/02/2016   Diabetic polyneuropathy (HCC) 11/10/2013   Past Medical History:  Diagnosis Date   Anemia    as an infant   Arthritis    Back pain    Depression    Disc displacement, lumbar    Gynecomastia    History of kidney stones    Hyperlipidemia    Hypertension    no longer on medications   Hypertriglyceridemia    Insomnia    Low back pain    Lumbar radiculopathy    Lumbar stenosis    OA (osteoarthritis)    Polyneuropathy in diabetes(357.2)    Prostate cancer (HCC) 2003   Restless legs    Ringing in ears    RLS (restless legs  syndrome)    Type 2 diabetes mellitus (HCC)     Past Surgical History:  Procedure Laterality Date   ABDOMINAL EXPOSURE N/A 07/13/2019   Procedure: ABDOMINAL EXPOSURE;  Surgeon: Serene Gaile ORN, MD;  Location: [facility] OR;  Service: Vascular;  Laterality: N/A;  anterior approach   ANTERIOR LAT LUMBAR FUSION Right 07/13/2019   Procedure: Right Lumbar 3-4 Lumbar 4-5 Anterolateral lumbar interbody fusion;  Surgeon: Unice Pac, MD;  Location: [facility] OR;  Service: Neurosurgery;  Laterality: Right;   ANTERIOR LUMBAR FUSION N/A 07/13/2019   Procedure: Lumbar Five-Sacral One Anterior lumbar interbody fusion;  Surgeon: Unice Pac, MD;  Location: [facility] OR;  Service: Neurosurgery;  Laterality: N/A;  Anterior approach   APPLICATION OF INTRAOPERATIVE CT SCAN N/A 04/24/2021   Procedure: APPLICATION OF INTRAOPERATIVE CT SCAN;  Surgeon: Dawley, Lani BROCKS, DO;  Location: [facility] OR;  Service: Neurosurgery;  Laterality: N/A;  3C/RM 21   CHOLECYSTECTOMY     COLONOSCOPY     CORONARY STENT INTERVENTION N/A 10/01/2020   Procedure: CORONARY STENT INTERVENTION;  Surgeon: Elmira Newman PARAS, MD;  Location: [facility] INVASIVE CV LAB;  Service: Cardiovascular;  Laterality: N/A;   EYE SURGERY Bilateral    GALLBLADDER SURGERY     HEMORRHOID SURGERY  1983   LEFT HEART CATH AND CORONARY ANGIOGRAPHY N/A 09/17/2020   Procedure: LEFT HEART CATH AND CORONARY ANGIOGRAPHY;  Surgeon: Elmira Newman PARAS, MD;  Location: [facility] INVASIVE CV LAB;  Service: Cardiovascular;  Laterality: N/A;   LUMBAR LAMINECTOMY/DECOMPRESSION MICRODISCECTOMY N/A 10/02/2016   Procedure: L3 to S1 Laminectomy;  Surgeon: Unice Pac, MD;  Location: [facility] OR;  Service: Neurosurgery;  Laterality: N/A;  L3 to S1 Laminectomy   LUMBAR LAMINECTOMY/DECOMPRESSION MICRODISCECTOMY Bilateral 09/07/2019   Procedure: Bilateral Lumbar Five - Sacral One Foraminotomy;  Surgeon: Unice Pac, MD;  Location: [facility] OR;  Service: Neurosurgery;  Laterality: Bilateral;  posterior   LUMBAR PERCUTANEOUS PEDICLE  SCREW 3 LEVEL N/A 07/13/2019   Procedure: Percutaneous pedicle screw fixation from Lumbar 3 to Sacral 1;  Surgeon: Unice Pac, MD;  Location: [facility] OR;  Service: Neurosurgery;  Laterality: N/A;   MENISCUS REPAIR Right    PROSTATE SURGERY     SHOULDER SURGERY Left 01/2011   rotator cuff    Medications Prior to Admission  Medication Sig Dispense Refill Last Dose/Taking   ascorbic acid  (VITAMIN C ) 500 MG tablet Take 500 mg by mouth daily.   03/13/2023 Morning   Calcium  Carb-Cholecalciferol  (CALCIUM  1000 + D PO) Take by mouth.   03/13/2023 Morning   clopidogrel  (PLAVIX ) 75 MG tablet Take 1 tablet (75 mg total) by mouth daily. **Restart Plavix  on 04/29/2021** 90 tablet 3 03/13/2023 at  8:30 AM   cyanocobalamin  1000 MCG tablet Take 1,000 mcg by mouth daily.   03/13/2023 Morning   Dulaglutide  (TRULICITY ) 3 MG/0.5ML SOAJ Inject 3 mg into the skin once a week. (Patient taking differently: Inject 3 mg into the skin once a week. Inject on Wednesday) 12 mL 3 Past Week   DULoxetine  (CYMBALTA ) 30 MG capsule Take 30 mg by mouth daily.   03/13/2023 Morning   ferrous sulfate  325 (65 FE) MG EC tablet Take 325 mg by mouth daily with breakfast.   03/13/2023 Morning   gabapentin  (NEURONTIN ) 100 MG capsule Take 100 mg by mouth at bedtime.   Past Week   insulin  glargine-yfgn (SEMGLEE) 100 UNIT/ML injection Inject 18 Units into the skin daily.   03/13/2023 Morning   Krill Oil 500 MG CAPS Take 1 capsule by mouth daily.   03/13/2023 Morning   Lysine 500 MG TABS Take 1 tablet by mouth daily.   03/13/2023 Morning   meloxicam  (MOBIC ) 15 MG tablet Take 15 mg by mouth daily.   03/13/2023 Morning   metFORMIN  (GLUCOPHAGE ) 500 MG tablet Take 2 tablets (1,000 mg total) by mouth 2 (two) times daily with a meal. Resume 10/03/2020  1 03/13/2023 Evening   Multiple Vitamins-Minerals (MENS 50+ MULTI VITAMIN/MIN) TABS Take 1 tablet by mouth daily.   03/13/2023 Morning   nitroGLYCERIN  (NITROSTAT ) 0.4 MG SL tablet Place 1 tablet (0.4 mg  total) under the tongue every 5 (five) minutes as needed for chest pain. 30 tablet 3 Taking As Needed   prazosin  (MINIPRESS ) 1 MG capsule Take 1 mg by mouth at bedtime.   Past Week   rOPINIRole  (REQUIP ) 4 MG tablet Take 4 mg by mouth at bedtime.   Past Week   rosuvastatin  (CRESTOR ) 40 MG tablet TAKE 1 TABLET(40 MG) BY MOUTH DAILY 90 tablet 3 03/13/2023 Morning   sildenafil (VIAGRA) 25 MG tablet Take by mouth.   Taking   TRESIBA FLEXTOUCH 100 UNIT/ML FlexTouch Pen daily. Inject on Wednesday   Past Week   Allergies  Allergen Reactions   Atorvastatin Other (See Comments)   Niacin     Other Reaction(s): break out in hives   Levaquin [Levofloxacin In D5w] Rash   Niaspan [Niacin Er (Antihyperlipidemic)] Rash and Other (See Comments)    Flushing reaction  Tramadol Rash    Social History   Tobacco Use   Smoking status: Some Days    Types: Cigars   Smokeless tobacco: Never   Tobacco comments:    occasional  Substance Use Topics   Alcohol use: Not Currently    Comment: social-beer    Family History  Problem Relation Age of Onset   Arthritis Mother    Hypertension Mother    Diabetes Mother    Heart attack Mother    Cancer Father       Objective:   Patient Vitals for the past 8 hrs:  BP Temp Temp src Pulse Resp SpO2  03/19/23 0812 (!) 110/58 98.3 F (36.8 C) Oral 84 17 98 %  03/19/23 0317 130/61 98.8 F (37.1 C) Oral -- 18 97 %   I/O last 3 completed shifts: In: 650 [P.O.:650] Out: 273 [Urine:252; Emesis/NG output:20; Stool:1] Total I/O In: -  Out: 800 [Urine:800]   Awake, alert, oriented Speech fluent, appropriate 4+/5 BUE/BLE Neg Hoffman BUE 5-6 beats clonus RLE DTR decreased globally.    Assessment:   This is a 83 yo with  -Abnormal cord signal C3-4, C4-5 and moderate spinal stenosis. Given recent fall, contusion is most likely.    Plan:   -No surgical intervention indicated at this time -Soft collar for symptom mgmt -F/u C Spine MRI in 23m -Call w/  questions/concerns.   Yvonnia Tango CAYLIN Nariya Neumeyer, PA-C

## 2023-03-19 NOTE — Progress Notes (Signed)
 Inpatient Rehab Coordinator Note:  I spoke with patient's spouse over the phone to discuss CIR recommendations and goals/expectations of CIR stay.  We reviewed 3 hrs/day of therapy, physician follow up, and average length of stay 2 weeks (dependent upon progress) with goals of supervision.  She is recovering from an illness and cannot manage pt at his current level of function and would like rehab.  She can provide supervision at time of discharge. I confirmed payor as [facility] and we will need prior auth.  Unfortunately, I will need an updated OT note to proceed, so I've asked them to see him as soon as they are able.  We will f/u and open insurance asap.    Reche Lowers, PT, DPT Admissions Coordinator [phone] 03/19/23  4:24 PM

## 2023-03-19 NOTE — Progress Notes (Signed)
 Orthopedic Tech Progress Note Patient Details:  Harry Zamora [facility] 1941/01/16 [id number]  Ortho Devices Type of Ortho Device: Soft collar Ortho Device/Splint Interventions: Ordered, Application, Adjustment   Post Interventions Patient Tolerated: Well Instructions Provided: Adjustment of device  Adine MARLA Blush 03/19/2023, 2:43 PM

## 2023-03-19 NOTE — Progress Notes (Addendum)
 Physical Therapy Treatment Patient Details Name: Harry Zamora MRN: [id number] DOB: 03-26-1940 Today's Date: 03/19/2023   History of Present Illness 83 y.o. male presents to [facility] hospital on 03/14/2023 after a fall and with R weakness. Pt received TNK. CT negative, MRI negative. 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 PMH includes OA, depression, HTN, HLD, prostate cancer, DMII.    PT Comments  Pt resting in bed on arrival and agreeable to session with good progress towards acute goals. Pt able to complete bed mobility with grossly CGA for safety, transfers with up to min A initially to boost to stand and gait with grossly CGA for safety with RW for support. Pt without overt LOB during gait, however general instability noted. Pt with positive orthostatics throughout session, however asymptomatic with BP recovering post ambulation, (see general comments for BP readings). Current plan remains appropriate to address deficits and maximize functional independence and decrease caregiver burden. Pt continues to benefit from skilled PT services to progress toward functional mobility goals.     If plan is discharge home, recommend the following: A little help with walking and/or transfers;A little help with bathing/dressing/bathroom;Assistance with cooking/housework;Supervision due to cognitive status;Help with stairs or ramp for entrance;Assist for transportation   Can travel by private vehicle        Equipment Recommendations  BSC/3in1    Recommendations for Other Services       Precautions / Restrictions Precautions Precautions: Fall;Other (comment) Precaution Comments: history of many falls Required Braces or Orthoses: Other Brace Other Brace: soft cervical collar for comfort Restrictions Weight Bearing Restrictions Per Provider Order: No     Mobility  Bed Mobility Overal bed mobility: Needs  Assistance Bed Mobility: Supine to Sit, Sit to Supine     Supine to sit: Supervision, Used rails, HOB elevated Sit to supine: Supervision   General bed mobility comments: supervision for safety with increased time to complete    Transfers Overall transfer level: Needs assistance Equipment used: Rolling walker (2 wheels) Transfers: Bed to chair/wheelchair/BSC Sit to Stand: Contact guard assist, Min assist           General transfer comment: min A initially to facilitate anterior momentum, CGA for safety for subsequent stands x3 from EOB    Ambulation/Gait Ambulation/Gait assistance: Contact guard assist Gait Distance (Feet): 190 Feet (+ 80) Assistive device: Rolling walker (2 wheels) Gait Pattern/deviations: Step-through pattern, Wide base of support, Decreased stride length Gait velocity: reduced     General Gait Details: CGA for safety, no overt LOB, cues for closer RW proximity and upright trunk   Stairs             Wheelchair Mobility     Tilt Bed    Modified Rankin (Stroke Patients Only) Modified Rankin (Stroke Patients Only) Pre-Morbid Rankin Score: Moderate disability Modified Rankin: Moderately severe disability     Balance Overall balance assessment: Needs assistance Sitting-balance support: Bilateral upper extremity supported, Feet supported Sitting balance-Leahy Scale: Fair     Standing balance support: Bilateral upper extremity supported, During functional activity, Reliant on assistive device for balance Standing balance-Leahy Scale: Poor Standing balance comment: reliant on UE support during dynamic tasks                            Cognition Arousal: Alert Behavior During Therapy: Flat affect Overall Cognitive Status: Impaired/Different from baseline Area of Impairment: Safety/judgement,  Awareness                         Safety/Judgement: Decreased awareness of deficits Awareness: Emergent             Exercises      General Comments General comments (skin integrity, edema, etc.): SpO2 >94% on RA during acttivity, BP 139/71 supine, 98/49 sitting up EOB, 90/44 standing, pt asymptommatic, pt performing seated warm up exercises and marching in place 137/68 post ambulation      Pertinent Vitals/Pain Pain Assessment Pain Assessment: No/denies pain    Home Living                          Prior Function            PT Goals (current goals can now be found in the care plan section) Acute Rehab PT Goals Patient Stated Goal: to stop falling PT Goal Formulation: With patient Time For Goal Achievement: 03/28/23 Progress towards PT goals: Progressing toward goals    Frequency    Min 1X/week      PT Plan      Co-evaluation              AM-PAC PT 6 Clicks Mobility   Outcome Measure  Help needed turning from your back to your side while in a flat bed without using bedrails?: None Help needed moving from lying on your back to sitting on the side of a flat bed without using bedrails?: None Help needed moving to and from a bed to a chair (including a wheelchair)?: A Little Help needed standing up from a chair using your arms (e.g., wheelchair or bedside chair)?: A Little Help needed to walk in hospital room?: A Little Help needed climbing 3-5 steps with a railing? : A Little 6 Click Score: 20    End of Session Equipment Utilized During Treatment: Gait belt Activity Tolerance: Patient tolerated treatment well Patient left: with call bell/phone within reach;in bed;with bed alarm set Nurse Communication: Mobility status PT Visit Diagnosis: Other abnormalities of gait and mobility (R26.89);Muscle weakness (generalized) (M62.81);History of falling (Z91.81)     Time: 1203-1227 PT Time Calculation (min) (ACUTE ONLY): 24 min  Charges:    $Gait Training: 8-22 mins $Therapeutic Activity: 8-22 mins PT General Charges $$ ACUTE PT VISIT: 1 Visit                      Jaleeah Slight R. PTA Acute Rehabilitation Services Office: [phone]   Therisa CHRISTELLA Boor 03/19/2023, 2:57 PM

## 2023-03-19 NOTE — Progress Notes (Signed)
 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  NS recommend conservative management with soft collar.  I left a message for pt's spouse to discuss potential rehab vs home.   Reche Lowers, PT, DPT Admissions Coordinator [phone] 03/19/23  2:33 PM

## 2023-03-19 NOTE — Progress Notes (Addendum)
 STROKE TEAM PROGRESS NOTE   SUBJECTIVE (INTERVAL HISTORY)  Patient sitting up in chair.  No acute events overnight.  Neurology exam stable with only mild and weakness.[name] C-spine completed yesterday, shows cord contusion at C3-C5 with small volume epidural hematoma with possible posterior longitudinal ligament disruption at C2-3.  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 Order placed for collar.   Planned for CIR at discharge  OBJECTIVE Temp:  [97.3 F (36.3 C)-101 F (38.3 C)] 97.5 F (36.4 C) (01/03 1114) Pulse Rate:  [78-109] 78 (01/03 1114) Cardiac Rhythm: Normal sinus rhythm (01/03 0900) Resp:  [17-18] 18 (01/03 1114) BP: (110-147)/(58-68) 136/68 (01/03 1114) SpO2:  [93 %-100 %] 99 % (01/03 1114)  Recent Labs  Lab 03/18/23 1155 03/18/23 1555 03/18/23 2131 03/19/23 0634 03/19/23 1112  GLUCAP 167* 147* 160* 179* 170*   Recent Labs  Lab 03/15/23 0452 03/16/23 0903 03/17/23 0729 03/18/23 0728 03/19/23 0722  NA 134* 134* 134* 136 135  K 3.9 3.9 3.7 3.8 3.4*  CL 101 101 100 100 100  CO2 23 23 23 25 22   GLUCOSE 99 134* 162* 146* 162*  BUN 34* 33* 29* 30* 33*  CREATININE 1.88* 1.94* 1.62* 1.55* 1.50*  CALCIUM  7.9* 8.3* 8.3* 8.3* 8.4*   Recent Labs  Lab 03/14/23 0010  AST 35  ALT 20  ALKPHOS 64  BILITOT 1.4*  PROT 6.4*  ALBUMIN  3.3*   Recent Labs  Lab 03/14/23 0010 03/14/23 0015 03/15/23 0452 03/15/23 0751 03/16/23 0903 03/17/23 0729 03/18/23 0728 03/19/23 0722  WBC 13.7*  --  6.6  --  5.7 5.5 6.8 11.6*  NEUTROABS 10.3*  --   --   --   --   --   --   --   HGB 11.1*   < > 8.0* 8.1* 8.7* 8.2* 8.1* 8.8*  HCT 34.4*   < > 24.4* 24.5* 25.3* 24.6* 23.9* 26.6*  MCV 96.6  --  94.2  --  93.0 92.5 91.9 93.0  PLT 188  --  136*  --  156 155 191 256   < > = values in this interval not displayed.   No  results for input(s): CKTOTAL, CKMB, CKMBINDEX, TROPONINI in the last 168 hours. No results for input(s): LABPROT, INR in the last 72 hours.  No results for input(s): COLORURINE, LABSPEC, PHURINE, GLUCOSEU, HGBUR, BILIRUBINUR, KETONESUR, PROTEINUR, UROBILINOGEN, NITRITE, LEUKOCYTESUR in the last 72 hours.  Invalid input(s): APPERANCEUR     Component Value Date/Time   CHOL 61 03/14/2023 0405   CHOL 137 10/03/2020 1138   TRIG 47 03/14/2023 0405   HDL 33 (L) 03/14/2023 0405   HDL 33 (L) 10/03/2020 1138   CHOLHDL 1.8 03/14/2023 0405   VLDL 9 03/14/2023 0405   LDLCALC 19 03/14/2023 0405   LDLCALC 82 10/03/2020 1138   Lab Results  Component Value Date   HGBA1C 7.5 (H) 03/15/2023   No results found for: LABOPIA, COCAINSCRNUR, LABBENZ, AMPHETMU, THCU, LABBARB  Recent Labs  Lab 03/14/23 0010  ETH <10   I have personally reviewed the radiological images below and agree with the radiology interpretations.  MR CERVICAL SPINE WO CONTRAST Result Date: 03/18/2023 CLINICAL DATA:  Ataxia, cervical trauma. EXAM: MRI CERVICAL SPINE WITHOUT CONTRAST TECHNIQUE: Multiplanar, multisequence MR imaging of the cervical spine was performed. No intravenous contrast was administered. COMPARISON:  Cervical spine CT 03/14/2023 FINDINGS: Alignment: Mild reversal  of the normal cervical lordosis. Trace anterolisthesis of C3 on C4 and C4 on C5, unchanged. Vertebrae: No acute fracture or suspicious marrow lesion. Cord: Abnormal confluent T2 hyperintensity in the right aspect of the spinal cord extending from the superior C3 level to the C4-5 disc space level, greatest at C4. No evidence of cord hemorrhage on gradient echo imaging. Small amount of abnormal ventral STIR hyperintense signal behind the C3 vertebral body and C2-3 disc space measuring up to 2 mm in thickness and suspicious for a small epidural hematoma. Possible trace ventral and dorsal epidural hematoma at C4 and  C5. Possible posterior longitudinal ligament disruption at C2-3. Posterior Fossa, vertebral arteries, paraspinal tissues: No significant prevertebral fluid or soft tissue swelling. Preserved vertebral artery flow voids. 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 C2-3: Mild uncovertebral spurring, asymmetrically severe left facet arthrosis with facet ankylosis, and suspected small volume epidural hematoma result in mild spinal stenosis and mild left neural foraminal stenosis. C3-4: Disc bulging, uncovertebral spurring, suspected small volume epidural hematoma, and moderate right and severe left facet arthrosis result in moderate spinal stenosis and mild right and severe left neural foraminal stenosis. C4-5: Disc bulging, uncovertebral spurring, and moderate to severe right and mild left facet arthrosis result in moderate spinal stenosis and mild-to-moderate right neural foraminal stenosis. C5-6: Disc bulging and uncovertebral spurring result in moderate spinal stenosis without significant neural foraminal stenosis. C6-7: Mild disc bulging without significant stenosis. C7-T1: Negative. These results will be called to the ordering clinician or representative by the Radiologist Assistant, and communication documented in the PACS or [employer]. IMPRESSION: 1. Abnormal cord signal at C3-4 and C4-5 compatible with edema/contusion. No evidence of cord hemorrhage. 2. Suspected small volume epidural hematoma in the upper cervical spine with possible PLL disruption at C2-3. 3. 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 Electronically Signed   By: Dasie Hamburg M.D.   On: 03/18/2023 19:48   ECHOCARDIOGRAM COMPLETE Result Date: 03/15/2023    ECHOCARDIOGRAM REPORT   Patient Name:   Harry Zamora Date of Exam: 03/15/2023 Medical Rec #:  [id number]      Height:       74.0  in Accession #:    [id number]     Weight:       148.6 lb Date of Birth:  1940/09/03       BSA:          1.916 m Patient Age:    82 years       BP:           116/51 mmHg Patient Gender: M              HR:           90 bpm. Exam Location:  Inpatient Procedure: 2D Echo, 3D Echo, Cardiac Doppler, Color Doppler and Strain Analysis Indications:    Stroke  History:        Patient has no prior history of Echocardiogram examinations.                 Risk Factors:Hypertension, Diabetes, Dyslipidemia and Current                 Smoker.  Sonographer:    Ozell Free Referring Phys: [id number] ASHISH ARORA  Sonographer Comments: Global longitudinal strain was attempted. IMPRESSIONS  1. Left ventricular ejection fraction, by estimation, is 55 to 60%. The left ventricle  has normal function. The left ventricle has no regional wall motion abnormalities. Left ventricular diastolic parameters were normal.  2. Right ventricular systolic function is normal. The right ventricular size is normal. There is moderately elevated pulmonary artery systolic pressure.  3. The mitral valve is normal in structure. No evidence of mitral valve regurgitation. No evidence of mitral stenosis.  4. The aortic valve is normal in structure. Aortic valve regurgitation is not visualized. No aortic stenosis is present.  5. The inferior vena cava is dilated in size with >50% respiratory variability, suggesting right atrial pressure of 8 mmHg. FINDINGS  Left Ventricle: Left ventricular ejection fraction, by estimation, is 55 to 60%. The left ventricle has normal function. The left ventricle has no regional wall motion abnormalities. The left ventricular internal cavity size was normal in size. There is  no left ventricular hypertrophy. Left ventricular diastolic parameters were normal. Right Ventricle: The right ventricular size is normal. No increase in right ventricular wall thickness. Right ventricular systolic function is normal. There is moderately elevated  pulmonary artery systolic pressure. The tricuspid regurgitant velocity is 2.94 m/s, and with an assumed right atrial pressure of 15 mmHg, the estimated right ventricular systolic pressure is 49.6 mmHg. Left Atrium: Left atrial size was normal in size. Right Atrium: Right atrial size was normal in size. Pericardium: There is no evidence of pericardial effusion. Mitral Valve: The mitral valve is normal in structure. No evidence of mitral valve regurgitation. No evidence of mitral valve stenosis. Tricuspid Valve: The tricuspid valve is normal in structure. Tricuspid valve regurgitation is trivial. No evidence of tricuspid stenosis. Aortic Valve: The aortic valve is normal in structure. Aortic valve regurgitation is not visualized. No aortic stenosis is present. Aortic valve mean gradient measures 5.0 mmHg. Aortic valve peak gradient measures 9.5 mmHg. Aortic valve area, by VTI measures 2.57 cm. Pulmonic Valve: The pulmonic valve was normal in structure. Pulmonic valve regurgitation is not visualized. No evidence of pulmonic stenosis. Aorta: The aortic root is normal in size and structure. Venous: The inferior vena cava is dilated in size with greater than 50% respiratory variability, suggesting right atrial pressure of 8 mmHg. IAS/Shunts: No atrial level shunt detected by color flow Doppler.  LEFT VENTRICLE PLAX 2D LVIDd:         4.40 cm   Diastology LVIDs:         2.80 cm   LV e' medial:    8.92 cm/s LV PW:         1.00 cm   LV E/e' medial:  8.4 LV IVS:        1.00 cm   LV e' lateral:   9.57 cm/s LVOT diam:     2.20 cm   LV E/e' lateral: 7.8 LV SV:         68 LV SV Index:   36 LVOT Area:     3.80 cm                           3D Volume EF:                          3D EF:        53 %                          LV EDV:       150 ml  LV ESV:       70 ml                          LV SV:        80 ml RIGHT VENTRICLE             IVC RV Basal diam:  4.40 cm     IVC diam: 2.30 cm RV S prime:     15.30  cm/s TAPSE (M-mode): 3.4 cm LEFT ATRIUM           Index        RIGHT ATRIUM           Index LA diam:      3.40 cm 1.77 cm/m   RA Area:     17.80 cm LA Vol (A2C): 59.8 ml 31.22 ml/m  RA Volume:   50.70 ml  26.47 ml/m LA Vol (A4C): 55.8 ml 29.13 ml/m  AORTIC VALVE AV Area (Vmax):    2.24 cm AV Area (Vmean):   2.24 cm AV Area (VTI):     2.57 cm AV Vmax:           154.00 cm/s AV Vmean:          106.000 cm/s AV VTI:            0.265 m AV Peak Grad:      9.5 mmHg AV Mean Grad:      5.0 mmHg LVOT Vmax:         90.70 cm/s LVOT Vmean:        62.400 cm/s LVOT VTI:          0.179 m LVOT/AV VTI ratio: 0.68  AORTA Ao Root diam: 3.60 cm Ao Asc diam:  3.00 cm MITRAL VALVE               TRICUSPID VALVE MV Area (PHT): 3.68 cm    TR Peak grad:   34.6 mmHg MV Decel Time: 206 msec    TR Vmax:        294.00 cm/s MV E velocity: 74.90 cm/s MV A velocity: 94.30 cm/s  SHUNTS MV E/A ratio:  0.79        Systemic VTI:  0.18 m                            Systemic Diam: 2.20 cm Aditya Sabharwal Electronically signed by Ria Commander Signature Date/Time: 03/15/2023/10:55:31 AM    Final    CT ABDOMEN PELVIS WO CONTRAST Result Date: 03/15/2023 CLINICAL DATA:  Abdominal pain. Acute decrease in hemoglobin with tender abdomen. Evaluate for hematoma EXAM: CT ABDOMEN AND PELVIS WITHOUT CONTRAST TECHNIQUE: Multidetector CT imaging of the abdomen and pelvis was performed following the standard protocol without IV contrast. RADIATION DOSE REDUCTION: This exam was performed according to the departmental dose-optimization program which includes automated exposure control, adjustment of the mA and/or kV according to patient size and/or use of iterative reconstruction technique. COMPARISON:  03/14/2023 FINDINGS: Lower chest: Dependent collapse/consolidation in both lower lobes is progressive in the interval with areas of peripheral airway impaction in the dependent lower lungs. Hepatobiliary: No suspicious focal abnormality in the liver on this  study without intravenous contrast. Cholecystectomy. No intrahepatic or extrahepatic biliary dilation. Pancreas: No focal mass lesion. No dilatation of the main duct. No intraparenchymal cyst. Diffuse atrophy. Spleen: No splenomegaly. No suspicious focal mass lesion. Adrenals/Urinary Tract: No adrenal nodule  or mass. Small nonobstructing stones are evident in both kidneys. High attenuation in the renal pelvis of each kidney in the ureters and the bladder lumen is compatible with excreted contrast from yesterday's CT scan. No hydroureteronephrosis. Bladder lumen opacified but otherwise unremarkable. Stomach/Bowel: Stomach is unremarkable. No gastric wall thickening. No evidence of outlet obstruction. Duodenum is normally positioned as is the ligament of Treitz. No small bowel wall thickening. No small bowel dilatation. The terminal ileum is normal. The appendix is normal. The tip of the appendix appears to be filled with soft tissue (51/3) without distension. This finding is stable comparing back to a CT from 03/24/2021 but appears minimally progressive when comparing to 09/12/2020. No gross colonic mass. No colonic wall thickening. Moderate to large stool volume evident. Vascular/Lymphatic: There is moderate atherosclerotic calcification of the abdominal aorta without aneurysm. There is no gastrohepatic or hepatoduodenal ligament lymphadenopathy. No retroperitoneal or mesenteric lymphadenopathy. No pelvic sidewall lymphadenopathy. 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 Other: No intraperitoneal free fluid. Musculoskeletal: No pelvic sidewall or retroperitoneal hematoma. There is some minimal thickening of the right posterior renal fascia, stable to minimally progressive since 03/14/2023, but this is only minimal and is nonspecific, not representing a discrete hematoma or substantial hemorrhage. Lumbar fusion hardware evident. The patient is noted to have asymmetry of the gluteus musculature  inferiorly with asymmetric enlargement on the left although this is stable to minimally increased compared to 03/14/2023 and has been incompletely visualized. IMPRESSION: 1. No pelvic sidewall or retroperitoneal hematoma. There is some minimal thickening of the right posterior renal fascia, stable to minimally progressive since 03/14/2023, but this is only minimal and is nonspecific, not representing a discrete hematoma or substantial hemorrhage. 2. Asymmetry of the gluteus musculature inferiorly with asymmetric enlargement on the left although this is stable to may be minimally increased compared to 03/14/2023 and has been incompletely visualized. There is not a discrete hematoma a discernible on this study but a component of intramuscular hemorrhage could have this appearance. This would likely be amenable to clinical inspection. 3. Dependent collapse/consolidation in both lower lobes is progressive in the interval with areas of peripheral airway impaction in the dependent lower lungs. Imaging features compatible may be infectious although aspiration could have this appearance. 4. The tip of the appendix appears to be filled with soft tissue without distension. This finding is stable comparing back to a CT from 03/24/2021 but appears minimally progressive when comparing to 09/12/2020. This is a nonspecific finding and may be related to mucocele. Follow-up CT in 6 months recommended. Non emergent follow-up surgical consultation recommended. 5. Small nonobstructing stones in both kidneys. 6.  Aortic Atherosclerosis (ICD10-I70.0). Electronically Signed   By: Camellia Candle M.D.   On: 03/15/2023 06:24   MR BRAIN WO CONTRAST Result Date: 03/15/2023 CLINICAL DATA:  Stroke/TIA.  Status post T NK. EXAM: MRI HEAD WITHOUT CONTRAST TECHNIQUE: Multiplanar, multiecho pulse sequences of the brain and surrounding structures were obtained without intravenous contrast. COMPARISON:  01/18/2023 FINDINGS: Brain: No acute infarct,  mass effect or extra-axial collection. 3-5 chronic microhemorrhages in the cerebellum. There is multifocal hyperintense T2-weighted signal within the white matter. Parenchymal volume and CSF spaces are normal. The midline structures are normal. Vascular: Normal flow voids. Skull and upper cervical spine: Posterior scalp subgaleal collection. Sinuses/Orbits:No paranasal sinus fluid levels or advanced mucosal thickening. No mastoid or middle ear effusion. Normal orbits. IMPRESSION: 1. No acute intracranial abnormality. 2. Findings of chronic small vessel ischemia. 3. Posterior scalp subgaleal hematoma. Electronically  Signed   By: Franky Stanford M.D.   On: 03/15/2023 00:01   CT HEAD WO CONTRAST ( ) Result Date: 03/14/2023 CLINICAL DATA:  Stroke, follow-up. Persistent right upper extremity ataxia and right lower extremity dysmetria. Status post TNK. EXAM: CT HEAD WITHOUT CONTRAST TECHNIQUE: Contiguous axial images were obtained from the base of the skull through the vertex without intravenous contrast. RADIATION DOSE REDUCTION: This exam was performed according to the departmental dose-optimization program which includes automated exposure control, adjustment of the mA and/or kV according to patient size and/or use of iterative reconstruction technique. COMPARISON:  CT head and CT angio head and neck 03/14/2023 at 12:15 a.m. FINDINGS: Brain: Mild atrophy and white matter disease is stable. No acute infarct or hemorrhage is present. Deep brain nuclei are within normal limits. The ventricles are proportionate to the degree of atrophy. No significant extraaxial fluid collection is present. The brainstem and cerebellum are within normal limits. Midline structures are within normal limits. Vascular: Atherosclerotic calcifications are again noted within the cavernous internal carotid arteries bilaterally and at the dural margin of the right vertebral artery. No hyperdense vessel is present. Skull: A left parietal and  occipital scalp hematoma is new. No acute fracture is present. Sinuses/Orbits: The paranasal sinuses and mastoid air cells are clear. Bilateral lens replacements are noted. Globes and orbits are otherwise unremarkable. IMPRESSION: 1. No acute intracranial abnormality or significant interval change. 2. Stable atrophy and white matter disease. This likely reflects the sequela of chronic microvascular ischemia. 3. New left parietal and occipital scalp hematoma without underlying fracture. Electronically Signed   By: Lonni Necessary M.D.   On: 03/14/2023 17:54   CT CHEST ABDOMEN PELVIS W CONTRAST Result Date: 03/14/2023 CLINICAL DATA:  Chest trauma, blunt Abdominal trauma, blunt. Stroke, fall. EXAM: CT CHEST, ABDOMEN, AND PELVIS WITH CONTRAST TECHNIQUE: Multidetector CT imaging of the chest, abdomen and pelvis was performed following the standard protocol during bolus administration of intravenous contrast. RADIATION DOSE REDUCTION: This exam was performed according to the departmental dose-optimization program which includes automated exposure control, adjustment of the mA and/or kV according to patient size and/or use of iterative reconstruction technique. CONTRAST:  75mL OMNIPAQUE  IOHEXOL  350 MG/ML SOLN COMPARISON:  CT angio chest 11/18/2022, CT angio chest 03/18/2021 FINDINGS: CHEST: Cardiovascular: No aortic injury. The thoracic aorta is normal in caliber. The heart is normal in size. No significant pericardial effusion. Moderate atherosclerotic plaque. At least 2 vessel coronary artery calcification. The main pulmonary artery is normal in caliber. No central or segmental pulmonary embolus. Limited evaluation more distally due to timing of contrast and motion artifact. Mediastinum/Nodes: No pneumomediastinum. No mediastinal hematoma. The esophagus is unremarkable. The thyroid  is unremarkable. The central airways are patent. No mediastinal, hilar, or axillary lymphadenopathy. Lungs/Pleura: Bilateral lower  lobe bronchial wall thickening. Bilateral lower lobe mucous plugging, right greater than left. Bibasilar atelectasis. Left lower lobe ground-glass airspace opacity. Stable left lower lobe calcified pulmonary micronodule-no further follow-up indicated. No pulmonary mass. No pulmonary contusion or laceration. No pneumatocele formation. No pleural effusion. No pneumothorax. No hemothorax. Musculoskeletal/Chest wall: No chest wall mass. No acute rib or sternal fracture. No spinal fracture. ABDOMEN / PELVIS: Hepatobiliary: Not enlarged. No focal lesion. No laceration or subcapsular hematoma. Status post cholecystectomy.  No biliary ductal dilatation. Pancreas: Normal pancreatic contour. No main pancreatic duct dilatation. Spleen: Not enlarged. No focal lesion. No laceration, subcapsular hematoma, or vascular injury. Adrenals/Urinary Tract: No nodularity bilaterally. Bilateral kidneys enhance and excrete symmetrically. No hydronephrosis. No contusion, laceration, or subcapsular  hematoma. No injury to the vascular structures or collecting systems. No hydroureter. The urinary bladder is unremarkable. Stomach/Bowel: No small or large bowel wall thickening or dilatation. Stool throughout the colon. The appendix is unremarkable. Vasculature/Lymphatics: Severe atherosclerotic plaque no abdominal aorta or iliac aneurysm. No active contrast extravasation or pseudoaneurysm. No abdominal, pelvic, inguinal lymphadenopathy. Reproductive: Radiation seeds along the prostate. Other: No simple free fluid ascites. No pneumoperitoneum. No hemoperitoneum. No mesenteric hematoma identified. No organized fluid collection. Musculoskeletal: No significant soft tissue hematoma. No acute pelvic fracture. No spinal fracture. L2-S2 posterolateral interbody surgical hardware fusion. Ports and Devices: None. IMPRESSION: 1. No acute intrathoracic, intra-abdominal, intrapelvic traumatic injury. 2. No acute fracture or traumatic malalignment of the  thoracic or lumbar spine. 3. Other imaging findings of potential clinical significance: Bilateral lower lobe bronchial wall thickening with bilateral mucous plugging and patchy ground-glass airspace opacity left lower lobe-question developing infection/inflammation. Stool throughout the colon-correlate for constipation. Aortic Atherosclerosis (ICD10-I70.0)-severe. Electronically Signed   By: Morgane  Naveau M.D.   On: 03/14/2023 03:34   DG Chest Port 1 View Result Date: 03/14/2023 CLINICAL DATA:  Recent fall EXAM: PORTABLE CHEST 1 VIEW COMPARISON:  11/01/2022 FINDINGS: Cardiac shadows within normal limits. Aortic calcifications are noted. Skin fold is noted over the right chest. Minimal left basilar atelectasis is seen. No focal confluent infiltrate is noted. No bony abnormality is noted. IMPRESSION: Mild left basilar atelectasis. Electronically Signed   By: Oneil Devonshire M.D.   On: 03/14/2023 00:58   CT ANGIO HEAD NECK W WO CM (CODE STROKE) Result Date: 03/14/2023 CLINICAL DATA:  Right arm weakness, right pupil blown, head trauma EXAM: CT ANGIOGRAPHY HEAD AND NECK WITH AND WITHOUT CONTRAST TECHNIQUE: Multidetector CT imaging of the head and neck was performed using the standard protocol during bolus administration of intravenous contrast. Multiplanar CT image reconstructions and MIPs were obtained to evaluate the vascular anatomy. Carotid stenosis measurements (when applicable) are obtained utilizing NASCET criteria, using the distal internal carotid diameter as the denominator. RADIATION DOSE REDUCTION: This exam was performed according to the departmental dose-optimization program which includes automated exposure control, adjustment of the mA and/or kV according to patient size and/or use of iterative reconstruction technique. CONTRAST:  75mL OMNIPAQUE  IOHEXOL  350 MG/ML SOLN COMPARISON:  09/13/2022 CTA head and neck, correlation is made with 03/14/2023 CT head FINDINGS: CT HEAD FINDINGS For noncontrast  findings, please see same day CT head. CTA NECK FINDINGS Aortic arch: Standard branching. Imaged portion shows no evidence of aneurysm or dissection. No significant stenosis of the major arch vessel origins. Aortic atherosclerosis Right carotid system: No evidence of dissection, occlusion, or hemodynamically significant stenosis (greater than 50%). Left carotid system: No evidence of dissection, occlusion, or hemodynamically significant stenosis (greater than 50%). Vertebral arteries: Moderate stenosis at the origin of the left vertebral artery. Evaluation of the proximal left vertebral artery is limited by beam hardening artifact from the adjacent contrast bolus. The left vertebral artery is otherwise patent to the skull base. Mild stenosis at the origin of the right vertebral artery. The right vertebral artery is otherwise patent to the skull base. No evidence of dissection. Skeleton: No acute osseous abnormality. Degenerative changes in the cervical spine. Other neck: No acute finding. Upper chest: No focal pulmonary opacity or pleural effusion. Review of the MIP images confirms the above findings CTA HEAD FINDINGS Anterior circulation: Both internal carotid arteries are patent to the termini, with mild stenosis in the bilateral cavernous segments and moderate to severe stenosis in the proximal supraclinoid  segments bilaterally. A1 segments patent. Normal anterior communicating artery. Anterior cerebral arteries are patent to their distal aspects without significant stenosis. No M1 stenosis or occlusion. MCA branches are irregular but perfused to their distal aspects without significant stenosis. Posterior circulation: Vertebral arteries patent to the vertebrobasilar junction, with mild stenosis in the proximal right V4. The left vertebral artery primarily supplies the left PICA, and is quite diminutive after the PICA takeoff. Posterior inferior cerebellar arteries patent proximally. Basilar patent to its distal  aspect without significant stenosis. Superior cerebellar arteries patent proximally. 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 Redemonstrated severe stenosis in the proximal left P2 (series 7, images 98-99), with multifocal irregularity in the remainder of the left P2. Evaluation of the more distal PCAs is limited by venous contamination. The bilateral posterior communicating arteries are not definitively seen. Venous sinuses: As permitted by contrast timing, patent. Anatomic variants: None significant. No evidence of aneurysm or vascular malformation. Review of the MIP images confirms the above findings IMPRESSION: 1. No intracranial large vessel occlusion. 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 2. 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 3. 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 4. Moderate stenosis at the origin of the left vertebral artery and mild stenosis at the origin of the right vertebral artery. 5. Aortic atherosclerosis. Aortic Atherosclerosis (ICD10-I70.0). Electronically Signed   By: Donald Campion M.D.   On: 03/14/2023 00:46   CT Cervical Spine Wo Contrast Result Date: 03/14/2023 CLINICAL DATA:  Neck trauma, fell backwards EXAM: CT CERVICAL SPINE WITHOUT CONTRAST TECHNIQUE: Multidetector CT imaging of the cervical spine was performed without intravenous contrast. Multiplanar CT image reconstructions were also generated. RADIATION DOSE REDUCTION: This exam was performed according to the departmental dose-optimization program which includes automated exposure control, adjustment of the mA and/or kV according to patient size and/or use of iterative  reconstruction technique. COMPARISON:  02/21/2023 CT cervical spine FINDINGS: Alignment: No traumatic listhesis. Straightening of the normal cervical lordosis. Trace stepwise anterolisthesis of C3 on C4 and C4 on C5. Skull base and vertebrae: No acute fracture. No primary bone lesion or focal pathologic process. Soft tissues and spinal canal: No prevertebral fluid or swelling. No visible canal hematoma. Disc levels: Degenerative changes in the cervical spine. No high-grade spinal canal stenosis. Upper chest: Negative. IMPRESSION: No acute fracture or traumatic listhesis in the cervical spine. Electronically Signed   By: Donald Campion M.D.   On: 03/14/2023 00:34   CT HEAD CODE STROKE WO CONTRAST Result Date: 03/14/2023 CLINICAL DATA:  Code stroke. Right arm weakness, right pupil blown, trauma to posterior head after fall EXAM: CT HEAD WITHOUT CONTRAST TECHNIQUE: Contiguous axial images were obtained from the base of the skull through the vertex without intravenous contrast. RADIATION DOSE REDUCTION: This exam was performed according to the departmental dose-optimization program which includes automated exposure control, adjustment of the mA and/or kV according to patient size and/or use of iterative reconstruction technique. COMPARISON:  02/21/2023 CT head FINDINGS: Brain: No evidence of acute infarction, hemorrhage, mass, mass effect, or midline shift. No hydrocephalus or extra-axial collection. Basal ganglia calcifications. Normal cerebral volume. Vascular: No hyperdense vessel. Atherosclerotic calcifications in the intracranial carotid and vertebral arteries. Skull:  Negative for fracture or focal lesion. Left occipital scalp hematoma. Sinuses/Orbits: No acute finding. Other: The mastoid air cells are well aerated. ASPECTS [employer] Surgical Stroke Program Early CT Score) - Ganglionic level infarction (caudate, lentiform nuclei, internal capsule, insula, M1-M3 cortex): 7 - Supraganglionic infarction (M4-M6 cortex): 3  Total score (0-10 with 10 being normal): 10 IMPRESSION: 1. No acute intracranial process. ASPECTS is 10. 2. Left occipital scalp hematoma. Imaging results were communicated on 03/14/2023 at 12:21 am to provider Dr. Voncile via secure text paging. Electronically Signed   By: Donald Campion M.D.   On: 03/14/2023 00:21   CT Cervical Spine Wo Contrast Result Date: 02/21/2023 CLINICAL DATA:  83 year old male status post fall yesterday, legs gave out. Abrasions. Blurred vision. EXAM: CT CERVICAL SPINE WITHOUT CONTRAST TECHNIQUE: Multidetector CT imaging of the cervical spine was performed without intravenous contrast. Multiplanar CT image reconstructions were also generated. RADIATION DOSE REDUCTION: This exam was performed according to the departmental dose-optimization program which includes automated exposure control, adjustment of the mA and/or kV according to patient size and/or use of iterative reconstruction technique. COMPARISON:  CT head and face today.  Cervical spine CT 04/06/2022. FINDINGS: Alignment: Chronic straightening but mildly increased reversal of cervical lordosis now. Cervicothoracic junction alignment is within normal limits. Mild chronic degenerative appearing anterolisthesis at both C3-C4 and C4-C5 with chronic facet degeneration. Bilateral posterior element alignment is within normal limits. Skull base and vertebrae: Bone mineralization is within normal limits. Visualized skull base is intact. No atlanto-occipital dissociation. C1 and C2 appear intact and aligned. No acute osseous abnormality identified. Soft tissues and spinal canal: No prevertebral fluid or swelling. No visible canal hematoma. Negative visible noncontrast neck soft tissues aside from carotid atherosclerosis. 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 Upper chest: Negative. IMPRESSION: 1. No acute  traumatic injury identified in the cervical spine. 2. 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 Electronically Signed   By: VEAR Hurst M.D.   On: 02/21/2023 09:15   CT Maxillofacial Wo Contrast Result Date: 02/21/2023 CLINICAL DATA:  83 year old male status post fall yesterday, legs gave out. Abrasions. Blurred vision. EXAM: CT MAXILLOFACIAL WITHOUT CONTRAST TECHNIQUE: Multidetector CT imaging of the maxillofacial structures was performed. Multiplanar CT image reconstructions were also generated. RADIATION DOSE REDUCTION: This exam was performed according to the departmental dose-optimization program which includes automated exposure control, adjustment of the mA and/or kV according to patient size and/or use of iterative reconstruction technique. COMPARISON:  CT head and cervical spine today. Prior face CT 11/02/2022. FINDINGS: Osseous: Mandible intact and normally located. Stable dentition. Bilateral maxilla, zygoma, pterygoid and nasal bones appear stable and intact. Central skull base appears intact. Orbits: No acute orbital wall fracture. Chronic postoperative changes to the globes. No acute orbit soft tissue finding. Sinuses: Visualized paranasal sinuses and mastoids are stable and well aerated. Soft tissues: Negative visible noncontrast larynx, pharynx, parapharyngeal spaces, retropharyngeal space, sublingual space. Partial submandibular gland atrophy is stable. Masticator and parotid spaces appear negative. Calcified atherosclerosis. No soft tissue gas or superficial soft tissue injury identified other than the right forehead reported separately. Limited intracranial: Stable to that reported separately. IMPRESSION: No acute traumatic injury identified in the Face. Electronically Signed   By: VEAR Hurst M.D.   On: 02/21/2023 09:13   CT Head Wo Contrast Result Date: 02/21/2023 CLINICAL DATA:  83 year old male status post fall yesterday, legs gave out.  Abrasions. Blurred vision. EXAM:  CT HEAD WITHOUT CONTRAST TECHNIQUE: Contiguous axial images were obtained from the base of the skull through the vertex without intravenous contrast. RADIATION DOSE REDUCTION: This exam was performed according to the departmental dose-optimization program which includes automated exposure control, adjustment of the mA and/or kV according to patient size and/or use of iterative reconstruction technique. COMPARISON:  Brain MRI 01/18/2023 and earlier. Face and cervical spine CT today reported separately. FINDINGS: Brain: Stable cerebral volume. Stable basal ganglia vascular calcifications. No midline shift, mass effect, No midline shift, ventriculomegaly, mass effect, evidence of mass lesion, intracranial hemorrhage or evidence of cortically based acute infarction. Stable gray-white matter differentiation throughout the brain. Mild for age white matter hypodensity. Vascular: Calcified atherosclerosis at the skull base. No suspicious intracranial vascular hyperdensity. Skull: Stable.  No fracture identified. Sinuses/Orbits: Visualized paranasal sinuses and mastoids are stable and well aerated. Other: Mild right forehead scalp hematoma or contusion series 3, image 18. Underlying right frontal bone intact. No scalp soft tissue gas. IMPRESSION: 1. Mild right forehead scalp hematoma or contusion. No skull fracture. 2. No acute intracranial abnormality. Stable mild for age chronic cerebral white matter changes. Electronically Signed   By: VEAR Hurst M.D.   On: 02/21/2023 09:09   PHYSICAL EXAM  Temp:  [97.3 F (36.3 C)-101 F (38.3 C)] 97.5 F (36.4 C) (01/03 1114) Pulse Rate:  [78-109] 78 (01/03 1114) Resp:  [17-18] 18 (01/03 1114) BP: (110-147)/(58-68) 136/68 (01/03 1114) SpO2:  [93 %-100 %] 99 % (01/03 1114)  General - Well nourished, well developed, in no apparent distress.  Cardiovascular - Regular rhythm and rate.  Neuro: Mental Status: Patient is awake, alert, oriented to  person, place, month, year, and situation. Patient is able to give a clear and coherent history. No signs of aphasia or neglect Cranial Nerves: II: Visual Fields are full. Pupils are equal, round, and reactive to light.  Glasses at baseline III,IV, VI: EOMI without ptosis or diploplia.  V: Facial sensation is symmetric to light touch VII: Facial movement is symmetric.  VIII: hearing is intact to voice X: Uvula elevates symmetrically.  Phonation intact XI: Shoulder shrug is symmetric. XII: tongue is midline  Motor: Tone is normal. Bulk is normal.  BUE: No drift present, 5/5 RLE: very slight drift when compared to LLE LLE: no drift Sensory: Sensation is symmetric to light touch in the arms and legs. Cerebellar: FNF slightly decreased RUE. HKS are intact bilaterally Gait: Deferred   ASSESSMENT/PLAN Mr. Harry Zamora is a 83 y.o. male with history of HTN, HLD, DM, prostate Ca, LBP admitted for fell at home, right sided weakness ataxia. TNK was given.    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 Left VA origin moderate stenosis MRI no acute infarct 2D Echo: LVEF 55 to 60%, LDL 19 HgbA1c: 7.5 SCDs for VTE prophylaxis clopidogrel  75 mg daily prior to admission, now on No antithrombotic due to negative MRI and acute anemia (improved) Ongoing aggressive stroke risk factor management Therapy recommendations:  CIR Disposition:  pending CIR admission   Hx of hypertension Orthostatic hypotension Stable on the low end Orthostatic vitals - supine 122/59, sitting 85/49. standing 66/49 but asymptomaticd Encourage po intake Continue TED hose Continue midodrine  5mg  tid from 8am to 5pm Off IVF BP goal <  180/105 Long term  BP goal normotensive  Acute anemia  Scalp laceration with prior bleeding Fall with scalp laceration -> bleeding after TNK -> now stopped Hb 11.1->8.0->8.1->8.7->8.2->8.1->8.8 CT C/A/P x 2 - No acute intrathoracic, intra-abdominal, intrapelvic traumatic injury. No pelvic sidewall or retroperitoneal hematoma. 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 Note/plan to be edited by MD as needed.    Rocky JAYSON Likes, DNP, AGACNP-BC [facility] Please use AMION for contact information & EPIC for messaging.  I have personally obtained history,examined this patient, reviewed notes, independently viewed imaging studies, participated in medical decision making and plan of care.ROS completed by me personally and pertinent positives fully documented  I have made any additions or clarifications directly to the above note. Agree with note above.  Patient neurologically stable.  MRI C-spine results reviewed with the patient and discussed with neurosurgery.  Patient is not a candidate for any surgical treatment at the present time.  Continue ongoing physical occupational therapy and transfer to inpatient rehab when bed available.  Long discussion with patient and answered questions.  Discussed with Dr. Debby neurosurgery.  Greater than 50% time during this 35-minute visit was spent in counseling and coordination of care discussion patient care team and  answering questions. I returned phone call from patient's wife to give her an update but did not get her and left a message on her answering machine. Eather Popp, MD Medical Director [facility] Pager: [phone] 03/19/2023 2:07 PM  To contact Stroke Continuity provider, please refer to [email or web address]. After hours, contact General Neurology

## 2023-03-19 NOTE — Progress Notes (Signed)
 Occupational Therapy Treatment Patient Details Name: Harry Zamora MRN: [id number] DOB: Jun 19, 1940 Today's Date: 03/19/2023   History of present illness 83 y.o. male presents to [facility] hospital on 03/14/2023 after a fall and with R weakness. Pt received TNK. CT negative, MRI negative. 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 PMH includes OA, depression, HTN, HLD, prostate cancer, DMII.   OT comments  Pt continues to be incontinent of stool, he assisted with re-dressing but needed additional assist with LBD. During activity pt noted to have elevated HR, labored breathing, sp02 at 90%. Earlier pt was ambulatory on RA with PT, Sp02 >94%,returned him to 2L. He was a bit unsteady with room ambulation due to fatigue but overall CGA with cues needed for safety. NP reports she will add flutter valve to pt orders for cough and sputum. OT to continue to progress pt as able. DC plans remain appropriate for AIR.       If plan is discharge home, recommend the following:  A little help with bathing/dressing/bathroom;Assistance with cooking/housework;Supervision due to cognitive status;A little help with walking and/or transfers   Equipment Recommendations  Other (comment) (TBD at next level of care)    Recommendations for Other Services      Precautions / Restrictions Precautions Precautions: Fall;Other (comment) Precaution Comments: history of many falls Required Braces or Orthoses: Other Brace Other Brace: soft cervical collar for comfort Restrictions Weight Bearing Restrictions Per Provider Order: No       Mobility Bed Mobility Overal bed mobility: Needs Assistance Bed Mobility: Supine to Sit, Sit to Supine     Supine to sit: Supervision, Used rails, HOB elevated Sit to supine: Supervision        Transfers Overall transfer level: Needs assistance Equipment used: Rolling walker (2  wheels) Transfers: Sit to/from Stand Sit to Stand: Contact guard assist           General transfer comment: STSx1 from EOB CGA, with min A to steady once in standing due to posterior posturual lean. STSx2 from toilet with CGA. Pt needing cues for proper hand placement with RW use throughout session.     Balance Overall balance assessment: Needs assistance Sitting-balance support: Bilateral upper extremity supported, Feet supported Sitting balance-Leahy Scale: Fair     Standing balance support: Bilateral upper extremity supported, During functional activity, Reliant on assistive device for balance Standing balance-Leahy Scale: Poor                             ADL either performed or assessed with clinical judgement   ADL       Grooming: Wash/dry face;Standing;Contact guard assist           Upper Body Dressing : Sitting;Set up   Lower Body Dressing: Sit to/from stand;Minimal assistance Lower Body Dressing Details (indicate cue type and reason): doffed/donned bilat socks with setup and increased time. Min A to don pants with STS Toilet Transfer: Contact guard assist;Rolling walker (2 wheels);Ambulation           Functional mobility during ADLs: Contact guard assist;Rolling walker (2 wheels) General ADL Comments: During sink ADLs pt noted to have loose stool dropping onto ground. Immediately went to position pt on toilet and proceed with cleaning pt and getting him new garments    Extremity/Trunk Assessment              Vision  Perception     Praxis      Cognition Arousal: Alert Behavior During Therapy: Flat affect Overall Cognitive Status: Impaired/Different from baseline Area of Impairment: Safety/judgement                         Safety/Judgement: Decreased awareness of safety     General Comments: pt continues to have no awareness of bowels,        Exercises      Shoulder Instructions       General Comments Pt  Sp02 90% on RA, HR up to 115 max with dressing activites and pt seemingly fatigued. Returned pt to supplemental 02 (2L) at end of session with Sp02 >93%. Messaged RN and NP to procure flutter valve in efforts to help with cough and sputum    Pertinent Vitals/ Pain       Pain Assessment Pain Assessment: No/denies pain  Home Living                                          Prior Functioning/Environment              Frequency  Min 1X/week        Progress Toward Goals  OT Goals(current goals can now be found in the care plan section)  Progress towards OT goals: Progressing toward goals  Acute Rehab OT Goals Time For Goal Achievement: 03/30/23 Potential to Achieve Goals: Good ADL Goals Pt Will Perform Lower Body Dressing: sit to/from stand;with contact guard assist Pt Will Transfer to Toilet: with supervision;ambulating  Plan      Co-evaluation                 AM-PAC OT 6 Clicks Daily Activity     Outcome Measure   Help from another person eating meals?: A Little Help from another person taking care of personal grooming?: A Little Help from another person toileting, which includes using toliet, bedpan, or urinal?: A Little Help from another person bathing (including washing, rinsing, drying)?: A Little Help from another person to put on and taking off regular upper body clothing?: A Little Help from another person to put on and taking off regular lower body clothing?: A Little 6 Click Score: 18    End of Session Equipment Utilized During Treatment: Gait belt;Rolling walker (2 wheels);Oxygen  OT Visit Diagnosis: Unsteadiness on feet (R26.81);Muscle weakness (generalized) (M62.81)   Activity Tolerance Patient tolerated treatment well   Patient Left in bed;with call bell/phone within reach;with bed alarm set   Nurse Communication Mobility status;Precautions        Time: 8353-8285 OT Time Calculation (min): 28 min  Charges: OT General  Charges $OT Visit: 1 Visit OT Treatments $Self Care/Home Management : 8-22 mins $Therapeutic Activity: 8-22 mins  03/19/2023  AB, OTR/L  Acute Rehabilitation Services  Office: [phone]   Curtistine JONETTA Das 03/19/2023, 5:53 PM

## 2023-03-20 ENCOUNTER — Inpatient Hospital Stay (HOSPITAL_COMMUNITY): Payer: Medicare HMO

## 2023-03-20 LAB — CBC
HCT: 24.2 % — ABNORMAL LOW (ref 39.0–52.0)
Hemoglobin: 8.2 g/dL — ABNORMAL LOW (ref 13.0–17.0)
MCH: 31.3 pg (ref 26.0–34.0)
MCHC: 33.9 g/dL (ref 30.0–36.0)
MCV: 92.4 fL (ref 80.0–100.0)
Platelets: 276 10*3/uL (ref 150–400)
RBC: 2.62 MIL/uL — ABNORMAL LOW (ref 4.22–5.81)
RDW: 12.8 % (ref 11.5–15.5)
WBC: 11.4 10*3/uL — ABNORMAL HIGH (ref 4.0–10.5)
nRBC: 0 % (ref 0.0–0.2)

## 2023-03-20 LAB — BASIC METABOLIC PANEL
Anion gap: 13 (ref 5–15)
BUN: 33 mg/dL — ABNORMAL HIGH (ref 8–23)
CO2: 23 mmol/L (ref 22–32)
Calcium: 8.3 mg/dL — ABNORMAL LOW (ref 8.9–10.3)
Chloride: 100 mmol/L (ref 98–111)
Creatinine, Ser: 1.39 mg/dL — ABNORMAL HIGH (ref 0.61–1.24)
GFR, Estimated: 50 mL/min — ABNORMAL LOW (ref >60)
Glucose, Bld: 155 mg/dL — ABNORMAL HIGH (ref 70–99)
Potassium: 3.4 mmol/L — ABNORMAL LOW (ref 3.5–5.1)
Sodium: 136 mmol/L (ref 135–145)

## 2023-03-20 LAB — GLUCOSE, CAPILLARY
Glucose-Capillary: 152 mg/dL — ABNORMAL HIGH (ref 70–99)
Glucose-Capillary: 132 mg/dL — ABNORMAL HIGH (ref 70–99)
Glucose-Capillary: 202 mg/dL — ABNORMAL HIGH (ref 70–99)
Glucose-Capillary: 114 mg/dL — ABNORMAL HIGH (ref 70–99)

## 2023-03-20 MED ORDER — ASPIRIN 81 MG PO TBEC
81.0000 mg | DELAYED_RELEASE_TABLET | Freq: Every day | ORAL | Status: DC
Start: 2023-03-20 — End: 2023-03-23
  Administered 2023-03-20 – 2023-03-23 (×4): 81 mg via ORAL
  Filled 2023-03-20 (×4): qty 1

## 2023-03-20 NOTE — Progress Notes (Signed)
 STROKE TEAM PROGRESS NOTE   SUBJECTIVE (INTERVAL HISTORY) No family is at the bedside. Pt lying in bed, AAO x 3, no complains. Had soft C-collar for cervical spine protection. NSG signed off. Hb stable but still at 8s. Has home plavix  for CAD, will start ASA today.   OBJECTIVE Temp:  [97.4 F (36.3 C)-100 F (37.8 C)] 97.4 F (36.3 C) (01/04 1118) Pulse Rate:  [73-97] 73 (01/04 1118) Cardiac Rhythm: Normal sinus rhythm (01/04 0700) Resp:  [18-19] 18 (01/04 1118) BP: (119-154)/(58-66) 122/62 (01/04 1118) SpO2:  [93 %-98 %] 98 % (01/04 1118)  Recent Labs  Lab 03/19/23 1112 03/19/23 1607 03/19/23 2107 03/20/23 0640 03/20/23 1144  GLUCAP 170* 115* 150* 152* 132*   Recent Labs  Lab 03/16/23 0903 03/17/23 0729 03/18/23 0728 03/19/23 0722 03/20/23 0559  NA 134* 134* 136 135 136  K 3.9 3.7 3.8 3.4* 3.4*  CL 101 100 100 100 100  CO2 23 23 25 22 23   GLUCOSE 134* 162* 146* 162* 155*  BUN 33* 29* 30* 33* 33*  CREATININE 1.94* 1.62* 1.55* 1.50* 1.39*  CALCIUM  8.3* 8.3* 8.3* 8.4* 8.3*   Recent Labs  Lab 03/14/23 0010  AST 35  ALT 20  ALKPHOS 64  BILITOT 1.4*  PROT 6.4*  ALBUMIN  3.3*   Recent Labs  Lab 03/14/23 0010 03/14/23 0015 03/16/23 0903 03/17/23 0729 03/18/23 0728 03/19/23 0722 03/20/23 0559  WBC 13.7*   < > 5.7 5.5 6.8 11.6* 11.4*  NEUTROABS 10.3*  --   --   --   --   --   --   HGB 11.1*   < > 8.7* 8.2* 8.1* 8.8* 8.2*  HCT 34.4*   < > 25.3* 24.6* 23.9* 26.6* 24.2*  MCV 96.6   < > 93.0 92.5 91.9 93.0 92.4  PLT 188   < > 156 155 191 256 276   < > = values in this interval not displayed.   No results for input(s): CKTOTAL, CKMB, CKMBINDEX, TROPONINI in the last 168 hours. No results for input(s): LABPROT, INR in the last 72 hours.  No results for input(s): COLORURINE, LABSPEC, PHURINE, GLUCOSEU, HGBUR, BILIRUBINUR, KETONESUR, PROTEINUR, UROBILINOGEN, NITRITE, LEUKOCYTESUR in the last 72 hours.  Invalid input(s):  APPERANCEUR     Component Value Date/Time   CHOL 61 03/14/2023 0405   CHOL 137 10/03/2020 1138   TRIG 47 03/14/2023 0405   HDL 33 (L) 03/14/2023 0405   HDL 33 (L) 10/03/2020 1138   CHOLHDL 1.8 03/14/2023 0405   VLDL 9 03/14/2023 0405   LDLCALC 19 03/14/2023 0405   LDLCALC 82 10/03/2020 1138   Lab Results  Component Value Date   HGBA1C 7.5 (H) 03/15/2023   No results found for: LABOPIA, COCAINSCRNUR, LABBENZ, AMPHETMU, THCU, LABBARB  Recent Labs  Lab 03/14/23 0010  ETH <10   I have personally reviewed the radiological images below and agree with the radiology interpretations.  DG CHEST PORT 1 VIEW Result Date: 03/20/2023 CLINICAL DATA:  Leukocytosis. EXAM: PORTABLE CHEST 1 VIEW COMPARISON:  Radiographs 03/14/2023 and 11/01/2022. Chest CT 03/14/2023 and abdominal CT 03/15/2023. FINDINGS: 0633 hours. The heart size and mediastinal contours are stable with aortic atherosclerosis. There are lower lung volumes with increased patchy bibasilar airspace opacities compared with the 03/14/2023 radiographs. These are similar to the more recent abdominal CT of 03/15/2023 and suspicious for bronchopneumonia or aspiration. No pleural effusion or pneumothorax. The bones appear unchanged. IMPRESSION: Lower lung volumes with increased patchy bibasilar airspace opacities suspicious for bronchopneumonia  or aspiration. Electronically Signed   By: Elsie Perone M.D.   On: 03/20/2023 10:27   MR CERVICAL SPINE WO CONTRAST Result Date: 03/18/2023 CLINICAL DATA:  Ataxia, cervical trauma. EXAM: MRI CERVICAL SPINE WITHOUT CONTRAST TECHNIQUE: Multiplanar, multisequence MR imaging of the cervical spine was performed. No intravenous contrast was administered. COMPARISON:  Cervical spine CT 03/14/2023 FINDINGS: Alignment: Mild reversal of the normal cervical lordosis. Trace anterolisthesis of C3 on C4 and C4 on C5, unchanged. Vertebrae: No acute fracture or suspicious marrow lesion. Cord: Abnormal  confluent T2 hyperintensity in the right aspect of the spinal cord extending from the superior C3 level to the C4-5 disc space level, greatest at C4. No evidence of cord hemorrhage on gradient echo imaging. Small amount of abnormal ventral STIR hyperintense signal behind the C3 vertebral body and C2-3 disc space measuring up to 2 mm in thickness and suspicious for a small epidural hematoma. Possible trace ventral and dorsal epidural hematoma at C4 and C5. Possible posterior longitudinal ligament disruption at C2-3. Posterior Fossa, vertebral arteries, paraspinal tissues: No significant prevertebral fluid or soft tissue swelling. Preserved vertebral artery flow voids. 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 C2-3: Mild uncovertebral spurring, asymmetrically severe left facet arthrosis with facet ankylosis, and suspected small volume epidural hematoma result in mild spinal stenosis and mild left neural foraminal stenosis. C3-4: Disc bulging, uncovertebral spurring, suspected small volume epidural hematoma, and moderate right and severe left facet arthrosis result in moderate spinal stenosis and mild right and severe left neural foraminal stenosis. C4-5: Disc bulging, uncovertebral spurring, and moderate to severe right and mild left facet arthrosis result in moderate spinal stenosis and mild-to-moderate right neural foraminal stenosis. C5-6: Disc bulging and uncovertebral spurring result in moderate spinal stenosis without significant neural foraminal stenosis. C6-7: Mild disc bulging without significant stenosis. C7-T1: Negative. These results will be called to the ordering clinician or representative by the Radiologist Assistant, and communication documented in the PACS or [employer]. IMPRESSION: 1. Abnormal cord signal at C3-4 and C4-5 compatible with edema/contusion. No evidence of cord hemorrhage. 2. Suspected small volume epidural hematoma in the upper cervical  spine with possible PLL disruption at C2-3. 3. 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 Electronically Signed   By: Dasie Hamburg M.D.   On: 03/18/2023 19:48   ECHOCARDIOGRAM COMPLETE Result Date: 03/15/2023    ECHOCARDIOGRAM REPORT   Patient Name:   Harry Zamora Date of Exam: 03/15/2023 Medical Rec #:  [id number]      Height:       74.0 in Accession #:    [id number]     Weight:       148.6 lb Date of Birth:  Jul 07, 1940       BSA:          1.916 m Patient Age:    82 years       BP:           116/51 mmHg Patient Gender: M              HR:           90 bpm. Exam Location:  Inpatient Procedure: 2D Echo, 3D Echo, Cardiac Doppler, Color Doppler and Strain Analysis Indications:    Stroke  History:        Patient has no prior history of Echocardiogram examinations.  Risk Factors:Hypertension, Diabetes, Dyslipidemia and Current                 Smoker.  Sonographer:    Ozell Free Referring Phys: [id number] ASHISH ARORA  Sonographer Comments: Global longitudinal strain was attempted. IMPRESSIONS  1. Left ventricular ejection fraction, by estimation, is 55 to 60%. The left ventricle has normal function. The left ventricle has no regional wall motion abnormalities. Left ventricular diastolic parameters were normal.  2. Right ventricular systolic function is normal. The right ventricular size is normal. There is moderately elevated pulmonary artery systolic pressure.  3. The mitral valve is normal in structure. No evidence of mitral valve regurgitation. No evidence of mitral stenosis.  4. The aortic valve is normal in structure. Aortic valve regurgitation is not visualized. No aortic stenosis is present.  5. The inferior vena cava is dilated in size with >50% respiratory variability, suggesting right atrial pressure of 8 mmHg. FINDINGS  Left Ventricle: Left ventricular ejection fraction, by estimation,  is 55 to 60%. The left ventricle has normal function. The left ventricle has no regional wall motion abnormalities. The left ventricular internal cavity size was normal in size. There is  no left ventricular hypertrophy. Left ventricular diastolic parameters were normal. Right Ventricle: The right ventricular size is normal. No increase in right ventricular wall thickness. Right ventricular systolic function is normal. There is moderately elevated pulmonary artery systolic pressure. The tricuspid regurgitant velocity is 2.94 m/s, and with an assumed right atrial pressure of 15 mmHg, the estimated right ventricular systolic pressure is 49.6 mmHg. Left Atrium: Left atrial size was normal in size. Right Atrium: Right atrial size was normal in size. Pericardium: There is no evidence of pericardial effusion. Mitral Valve: The mitral valve is normal in structure. No evidence of mitral valve regurgitation. No evidence of mitral valve stenosis. Tricuspid Valve: The tricuspid valve is normal in structure. Tricuspid valve regurgitation is trivial. No evidence of tricuspid stenosis. Aortic Valve: The aortic valve is normal in structure. Aortic valve regurgitation is not visualized. No aortic stenosis is present. Aortic valve mean gradient measures 5.0 mmHg. Aortic valve peak gradient measures 9.5 mmHg. Aortic valve area, by VTI measures 2.57 cm. Pulmonic Valve: The pulmonic valve was normal in structure. Pulmonic valve regurgitation is not visualized. No evidence of pulmonic stenosis. Aorta: The aortic root is normal in size and structure. Venous: The inferior vena cava is dilated in size with greater than 50% respiratory variability, suggesting right atrial pressure of 8 mmHg. IAS/Shunts: No atrial level shunt detected by color flow Doppler.  LEFT VENTRICLE PLAX 2D LVIDd:         4.40 cm   Diastology LVIDs:         2.80 cm   LV e' medial:    8.92 cm/s LV PW:         1.00 cm   LV E/e' medial:  8.4 LV IVS:        1.00 cm   LV  e' lateral:   9.57 cm/s LVOT diam:     2.20 cm   LV E/e' lateral: 7.8 LV SV:         68 LV SV Index:   36 LVOT Area:     3.80 cm                           3D Volume EF:  3D EF:        53 %                          LV EDV:       150 ml                          LV ESV:       70 ml                          LV SV:        80 ml RIGHT VENTRICLE             IVC RV Basal diam:  4.40 cm     IVC diam: 2.30 cm RV S prime:     15.30 cm/s TAPSE (M-mode): 3.4 cm LEFT ATRIUM           Index        RIGHT ATRIUM           Index LA diam:      3.40 cm 1.77 cm/m   RA Area:     17.80 cm LA Vol (A2C): 59.8 ml 31.22 ml/m  RA Volume:   50.70 ml  26.47 ml/m LA Vol (A4C): 55.8 ml 29.13 ml/m  AORTIC VALVE AV Area (Vmax):    2.24 cm AV Area (Vmean):   2.24 cm AV Area (VTI):     2.57 cm AV Vmax:           154.00 cm/s AV Vmean:          106.000 cm/s AV VTI:            0.265 m AV Peak Grad:      9.5 mmHg AV Mean Grad:      5.0 mmHg LVOT Vmax:         90.70 cm/s LVOT Vmean:        62.400 cm/s LVOT VTI:          0.179 m LVOT/AV VTI ratio: 0.68  AORTA Ao Root diam: 3.60 cm Ao Asc diam:  3.00 cm MITRAL VALVE               TRICUSPID VALVE MV Area (PHT): 3.68 cm    TR Peak grad:   34.6 mmHg MV Decel Time: 206 msec    TR Vmax:        294.00 cm/s MV E velocity: 74.90 cm/s MV A velocity: 94.30 cm/s  SHUNTS MV E/A ratio:  0.79        Systemic VTI:  0.18 m                            Systemic Diam: 2.20 cm Aditya Sabharwal Electronically signed by Ria Commander Signature Date/Time: 03/15/2023/10:55:31 AM    Final    CT ABDOMEN PELVIS WO CONTRAST Result Date: 03/15/2023 CLINICAL DATA:  Abdominal pain. Acute decrease in hemoglobin with tender abdomen. Evaluate for hematoma EXAM: CT ABDOMEN AND PELVIS WITHOUT CONTRAST TECHNIQUE: Multidetector CT imaging of the abdomen and pelvis was performed following the standard protocol without IV contrast. RADIATION DOSE REDUCTION: This exam was performed according to the  departmental dose-optimization program which includes automated exposure control, adjustment of the mA and/or kV according to patient size and/or use of iterative reconstruction technique. COMPARISON:  03/14/2023 FINDINGS: Lower chest: Dependent collapse/consolidation  in both lower lobes is progressive in the interval with areas of peripheral airway impaction in the dependent lower lungs. Hepatobiliary: No suspicious focal abnormality in the liver on this study without intravenous contrast. Cholecystectomy. No intrahepatic or extrahepatic biliary dilation. Pancreas: No focal mass lesion. No dilatation of the main duct. No intraparenchymal cyst. Diffuse atrophy. Spleen: No splenomegaly. No suspicious focal mass lesion. Adrenals/Urinary Tract: No adrenal nodule or mass. Small nonobstructing stones are evident in both kidneys. High attenuation in the renal pelvis of each kidney in the ureters and the bladder lumen is compatible with excreted contrast from yesterday's CT scan. No hydroureteronephrosis. Bladder lumen opacified but otherwise unremarkable. Stomach/Bowel: Stomach is unremarkable. No gastric wall thickening. No evidence of outlet obstruction. Duodenum is normally positioned as is the ligament of Treitz. No small bowel wall thickening. No small bowel dilatation. The terminal ileum is normal. The appendix is normal. The tip of the appendix appears to be filled with soft tissue (51/3) without distension. This finding is stable comparing back to a CT from 03/24/2021 but appears minimally progressive when comparing to 09/12/2020. No gross colonic mass. No colonic wall thickening. Moderate to large stool volume evident. Vascular/Lymphatic: There is moderate atherosclerotic calcification of the abdominal aorta without aneurysm. There is no gastrohepatic or hepatoduodenal ligament lymphadenopathy. No retroperitoneal or mesenteric lymphadenopathy. No pelvic sidewall lymphadenopathy. 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 Other: No intraperitoneal free fluid. Musculoskeletal: No pelvic sidewall or retroperitoneal hematoma. There is some minimal thickening of the right posterior renal fascia, stable to minimally progressive since 03/14/2023, but this is only minimal and is nonspecific, not representing a discrete hematoma or substantial hemorrhage. Lumbar fusion hardware evident. The patient is noted to have asymmetry of the gluteus musculature inferiorly with asymmetric enlargement on the left although this is stable to minimally increased compared to 03/14/2023 and has been incompletely visualized. IMPRESSION: 1. No pelvic sidewall or retroperitoneal hematoma. There is some minimal thickening of the right posterior renal fascia, stable to minimally progressive since 03/14/2023, but this is only minimal and is nonspecific, not representing a discrete hematoma or substantial hemorrhage. 2. Asymmetry of the gluteus musculature inferiorly with asymmetric enlargement on the left although this is stable to may be minimally increased compared to 03/14/2023 and has been incompletely visualized. There is not a discrete hematoma a discernible on this study but a component of intramuscular hemorrhage could have this appearance. This would likely be amenable to clinical inspection. 3. Dependent collapse/consolidation in both lower lobes is progressive in the interval with areas of peripheral airway impaction in the dependent lower lungs. Imaging features compatible may be infectious although aspiration could have this appearance. 4. The tip of the appendix appears to be filled with soft tissue without distension. This finding is stable comparing back to a CT from 03/24/2021 but appears minimally progressive when comparing to 09/12/2020. This is a nonspecific finding and may be related to mucocele. Follow-up CT in 6 months recommended. Non emergent follow-up surgical consultation recommended. 5. Small nonobstructing  stones in both kidneys. 6.  Aortic Atherosclerosis (ICD10-I70.0). Electronically Signed   By: Camellia Candle M.D.   On: 03/15/2023 06:24   MR BRAIN WO CONTRAST Result Date: 03/15/2023 CLINICAL DATA:  Stroke/TIA.  Status post T NK. EXAM: MRI HEAD WITHOUT CONTRAST TECHNIQUE: Multiplanar, multiecho pulse sequences of the brain and surrounding structures were obtained without intravenous contrast. COMPARISON:  01/18/2023 FINDINGS: Brain: No acute infarct, mass effect or extra-axial collection. 3-5 chronic microhemorrhages in the cerebellum. There  is multifocal hyperintense T2-weighted signal within the white matter. Parenchymal volume and CSF spaces are normal. The midline structures are normal. Vascular: Normal flow voids. Skull and upper cervical spine: Posterior scalp subgaleal collection. Sinuses/Orbits:No paranasal sinus fluid levels or advanced mucosal thickening. No mastoid or middle ear effusion. Normal orbits. IMPRESSION: 1. No acute intracranial abnormality. 2. Findings of chronic small vessel ischemia. 3. Posterior scalp subgaleal hematoma. Electronically Signed   By: Franky Stanford M.D.   On: 03/15/2023 00:01   CT HEAD WO CONTRAST ( ) Result Date: 03/14/2023 CLINICAL DATA:  Stroke, follow-up. Persistent right upper extremity ataxia and right lower extremity dysmetria. Status post TNK. EXAM: CT HEAD WITHOUT CONTRAST TECHNIQUE: Contiguous axial images were obtained from the base of the skull through the vertex without intravenous contrast. RADIATION DOSE REDUCTION: This exam was performed according to the departmental dose-optimization program which includes automated exposure control, adjustment of the mA and/or kV according to patient size and/or use of iterative reconstruction technique. COMPARISON:  CT head and CT angio head and neck 03/14/2023 at 12:15 a.m. FINDINGS: Brain: Mild atrophy and white matter disease is stable. No acute infarct or hemorrhage is present. Deep brain nuclei are within  normal limits. The ventricles are proportionate to the degree of atrophy. No significant extraaxial fluid collection is present. The brainstem and cerebellum are within normal limits. Midline structures are within normal limits. Vascular: Atherosclerotic calcifications are again noted within the cavernous internal carotid arteries bilaterally and at the dural margin of the right vertebral artery. No hyperdense vessel is present. Skull: A left parietal and occipital scalp hematoma is new. No acute fracture is present. Sinuses/Orbits: The paranasal sinuses and mastoid air cells are clear. Bilateral lens replacements are noted. Globes and orbits are otherwise unremarkable. IMPRESSION: 1. No acute intracranial abnormality or significant interval change. 2. Stable atrophy and white matter disease. This likely reflects the sequela of chronic microvascular ischemia. 3. New left parietal and occipital scalp hematoma without underlying fracture. Electronically Signed   By: Lonni Necessary M.D.   On: 03/14/2023 17:54   CT CHEST ABDOMEN PELVIS W CONTRAST Result Date: 03/14/2023 CLINICAL DATA:  Chest trauma, blunt Abdominal trauma, blunt. Stroke, fall. EXAM: CT CHEST, ABDOMEN, AND PELVIS WITH CONTRAST TECHNIQUE: Multidetector CT imaging of the chest, abdomen and pelvis was performed following the standard protocol during bolus administration of intravenous contrast. RADIATION DOSE REDUCTION: This exam was performed according to the departmental dose-optimization program which includes automated exposure control, adjustment of the mA and/or kV according to patient size and/or use of iterative reconstruction technique. CONTRAST:  75mL OMNIPAQUE  IOHEXOL  350 MG/ML SOLN COMPARISON:  CT angio chest 11/18/2022, CT angio chest 03/18/2021 FINDINGS: CHEST: Cardiovascular: No aortic injury. The thoracic aorta is normal in caliber. The heart is normal in size. No significant pericardial effusion. Moderate atherosclerotic plaque.  At least 2 vessel coronary artery calcification. The main pulmonary artery is normal in caliber. No central or segmental pulmonary embolus. Limited evaluation more distally due to timing of contrast and motion artifact. Mediastinum/Nodes: No pneumomediastinum. No mediastinal hematoma. The esophagus is unremarkable. The thyroid  is unremarkable. The central airways are patent. No mediastinal, hilar, or axillary lymphadenopathy. Lungs/Pleura: Bilateral lower lobe bronchial wall thickening. Bilateral lower lobe mucous plugging, right greater than left. Bibasilar atelectasis. Left lower lobe ground-glass airspace opacity. Stable left lower lobe calcified pulmonary micronodule-no further follow-up indicated. No pulmonary mass. No pulmonary contusion or laceration. No pneumatocele formation. No pleural effusion. No pneumothorax. No hemothorax. Musculoskeletal/Chest wall: No chest wall mass. No  acute rib or sternal fracture. No spinal fracture. ABDOMEN / PELVIS: Hepatobiliary: Not enlarged. No focal lesion. No laceration or subcapsular hematoma. Status post cholecystectomy.  No biliary ductal dilatation. Pancreas: Normal pancreatic contour. No main pancreatic duct dilatation. Spleen: Not enlarged. No focal lesion. No laceration, subcapsular hematoma, or vascular injury. Adrenals/Urinary Tract: No nodularity bilaterally. Bilateral kidneys enhance and excrete symmetrically. No hydronephrosis. No contusion, laceration, or subcapsular hematoma. No injury to the vascular structures or collecting systems. No hydroureter. The urinary bladder is unremarkable. Stomach/Bowel: No small or large bowel wall thickening or dilatation. Stool throughout the colon. The appendix is unremarkable. Vasculature/Lymphatics: Severe atherosclerotic plaque no abdominal aorta or iliac aneurysm. No active contrast extravasation or pseudoaneurysm. No abdominal, pelvic, inguinal lymphadenopathy. Reproductive: Radiation seeds along the prostate. Other: No  simple free fluid ascites. No pneumoperitoneum. No hemoperitoneum. No mesenteric hematoma identified. No organized fluid collection. Musculoskeletal: No significant soft tissue hematoma. No acute pelvic fracture. No spinal fracture. L2-S2 posterolateral interbody surgical hardware fusion. Ports and Devices: None. IMPRESSION: 1. No acute intrathoracic, intra-abdominal, intrapelvic traumatic injury. 2. No acute fracture or traumatic malalignment of the thoracic or lumbar spine. 3. Other imaging findings of potential clinical significance: Bilateral lower lobe bronchial wall thickening with bilateral mucous plugging and patchy ground-glass airspace opacity left lower lobe-question developing infection/inflammation. Stool throughout the colon-correlate for constipation. Aortic Atherosclerosis (ICD10-I70.0)-severe. Electronically Signed   By: Morgane  Naveau M.D.   On: 03/14/2023 03:34   DG Chest Port 1 View Result Date: 03/14/2023 CLINICAL DATA:  Recent fall EXAM: PORTABLE CHEST 1 VIEW COMPARISON:  11/01/2022 FINDINGS: Cardiac shadows within normal limits. Aortic calcifications are noted. Skin fold is noted over the right chest. Minimal left basilar atelectasis is seen. No focal confluent infiltrate is noted. No bony abnormality is noted. IMPRESSION: Mild left basilar atelectasis. Electronically Signed   By: Oneil Devonshire M.D.   On: 03/14/2023 00:58   CT ANGIO HEAD NECK W WO CM (CODE STROKE) Result Date: 03/14/2023 CLINICAL DATA:  Right arm weakness, right pupil blown, head trauma EXAM: CT ANGIOGRAPHY HEAD AND NECK WITH AND WITHOUT CONTRAST TECHNIQUE: Multidetector CT imaging of the head and neck was performed using the standard protocol during bolus administration of intravenous contrast. Multiplanar CT image reconstructions and MIPs were obtained to evaluate the vascular anatomy. Carotid stenosis measurements (when applicable) are obtained utilizing NASCET criteria, using the distal internal carotid diameter as  the denominator. RADIATION DOSE REDUCTION: This exam was performed according to the departmental dose-optimization program which includes automated exposure control, adjustment of the mA and/or kV according to patient size and/or use of iterative reconstruction technique. CONTRAST:  75mL OMNIPAQUE  IOHEXOL  350 MG/ML SOLN COMPARISON:  09/13/2022 CTA head and neck, correlation is made with 03/14/2023 CT head FINDINGS: CT HEAD FINDINGS For noncontrast findings, please see same day CT head. CTA NECK FINDINGS Aortic arch: Standard branching. Imaged portion shows no evidence of aneurysm or dissection. No significant stenosis of the major arch vessel origins. Aortic atherosclerosis Right carotid system: No evidence of dissection, occlusion, or hemodynamically significant stenosis (greater than 50%). Left carotid system: No evidence of dissection, occlusion, or hemodynamically significant stenosis (greater than 50%). Vertebral arteries: Moderate stenosis at the origin of the left vertebral artery. Evaluation of the proximal left vertebral artery is limited by beam hardening artifact from the adjacent contrast bolus. The left vertebral artery is otherwise patent to the skull base. Mild stenosis at the origin of the right vertebral artery. The right vertebral artery is otherwise patent to the skull  base. No evidence of dissection. Skeleton: No acute osseous abnormality. Degenerative changes in the cervical spine. Other neck: No acute finding. Upper chest: No focal pulmonary opacity or pleural effusion. Review of the MIP images confirms the above findings CTA HEAD FINDINGS Anterior circulation: Both internal carotid arteries are patent to the termini, with mild stenosis in the bilateral cavernous segments and moderate to severe stenosis in the proximal supraclinoid segments bilaterally. A1 segments patent. Normal anterior communicating artery. Anterior cerebral arteries are patent to their distal aspects without significant  stenosis. No M1 stenosis or occlusion. MCA branches are irregular but perfused to their distal aspects without significant stenosis. Posterior circulation: Vertebral arteries patent to the vertebrobasilar junction, with mild stenosis in the proximal right V4. The left vertebral artery primarily supplies the left PICA, and is quite diminutive after the PICA takeoff. Posterior inferior cerebellar arteries patent proximally. Basilar patent to its distal aspect without significant stenosis. Superior cerebellar arteries patent proximally. 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 Redemonstrated severe stenosis in the proximal left P2 (series 7, images 98-99), with multifocal irregularity in the remainder of the left P2. Evaluation of the more distal PCAs is limited by venous contamination. The bilateral posterior communicating arteries are not definitively seen. Venous sinuses: As permitted by contrast timing, patent. Anatomic variants: None significant. No evidence of aneurysm or vascular malformation. Review of the MIP images confirms the above findings IMPRESSION: 1. No intracranial large vessel occlusion. 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 2. 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 3. 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 4. Moderate stenosis at the origin of the left vertebral artery and mild stenosis at the origin of the right vertebral artery. 5. Aortic atherosclerosis. Aortic Atherosclerosis (ICD10-I70.0). Electronically Signed   By: Donald Campion M.D.   On: 03/14/2023 00:46   CT Cervical Spine Wo Contrast Result Date: 03/14/2023 CLINICAL DATA:   Neck trauma, fell backwards EXAM: CT CERVICAL SPINE WITHOUT CONTRAST TECHNIQUE: Multidetector CT imaging of the cervical spine was performed without intravenous contrast. Multiplanar CT image reconstructions were also generated. RADIATION DOSE REDUCTION: This exam was performed according to the departmental dose-optimization program which includes automated exposure control, adjustment of the mA and/or kV according to patient size and/or use of iterative reconstruction technique. COMPARISON:  02/21/2023 CT cervical spine FINDINGS: Alignment: No traumatic listhesis. Straightening of the normal cervical lordosis. Trace stepwise anterolisthesis of C3 on C4 and C4 on C5. Skull base and vertebrae: No acute fracture. No primary bone lesion or focal pathologic process. Soft tissues and spinal canal: No prevertebral fluid or swelling. No visible canal hematoma. Disc levels: Degenerative changes in the cervical spine. No high-grade spinal canal stenosis. Upper chest: Negative. IMPRESSION: No acute fracture or traumatic listhesis in the cervical spine. Electronically Signed   By: Donald Campion M.D.   On: 03/14/2023 00:34   CT HEAD CODE STROKE WO CONTRAST Result Date: 03/14/2023 CLINICAL DATA:  Code stroke. Right arm weakness, right pupil blown, trauma to posterior head after fall EXAM: CT HEAD WITHOUT CONTRAST TECHNIQUE: Contiguous axial images were obtained from the base of the skull through the vertex without intravenous contrast. RADIATION DOSE REDUCTION: This exam was performed according  to the departmental dose-optimization program which includes automated exposure control, adjustment of the mA and/or kV according to patient size and/or use of iterative reconstruction technique. COMPARISON:  02/21/2023 CT head FINDINGS: Brain: No evidence of acute infarction, hemorrhage, mass, mass effect, or midline shift. No hydrocephalus or extra-axial collection. Basal ganglia calcifications. Normal cerebral volume. Vascular: No  hyperdense vessel. Atherosclerotic calcifications in the intracranial carotid and vertebral arteries. Skull: Negative for fracture or focal lesion. Left occipital scalp hematoma. Sinuses/Orbits: No acute finding. Other: The mastoid air cells are well aerated. ASPECTS [facility] Stroke Program Early CT Score) - Ganglionic level infarction (caudate, lentiform nuclei, internal capsule, insula, M1-M3 cortex): 7 - Supraganglionic infarction (M4-M6 cortex): 3 Total score (0-10 with 10 being normal): 10 IMPRESSION: 1. No acute intracranial process. ASPECTS is 10. 2. Left occipital scalp hematoma. Imaging results were communicated on 03/14/2023 at 12:21 am to provider Dr. Voncile via secure text paging. Electronically Signed   By: Donald Campion M.D.   On: 03/14/2023 00:21   CT Cervical Spine Wo Contrast Result Date: 02/21/2023 CLINICAL DATA:  83 year old male status post fall yesterday, legs gave out. Abrasions. Blurred vision. EXAM: CT CERVICAL SPINE WITHOUT CONTRAST TECHNIQUE: Multidetector CT imaging of the cervical spine was performed without intravenous contrast. Multiplanar CT image reconstructions were also generated. RADIATION DOSE REDUCTION: This exam was performed according to the departmental dose-optimization program which includes automated exposure control, adjustment of the mA and/or kV according to patient size and/or use of iterative reconstruction technique. COMPARISON:  CT head and face today.  Cervical spine CT 04/06/2022. FINDINGS: Alignment: Chronic straightening but mildly increased reversal of cervical lordosis now. Cervicothoracic junction alignment is within normal limits. Mild chronic degenerative appearing anterolisthesis at both C3-C4 and C4-C5 with chronic facet degeneration. Bilateral posterior element alignment is within normal limits. Skull base and vertebrae: Bone mineralization is within normal limits. Visualized skull base is intact. No atlanto-occipital dissociation. C1 and C2 appear  intact and aligned. No acute osseous abnormality identified. Soft tissues and spinal canal: No prevertebral fluid or swelling. No visible canal hematoma. Negative visible noncontrast neck soft tissues aside from carotid atherosclerosis. 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 Upper chest: Negative. IMPRESSION: 1. No acute traumatic injury identified in the cervical spine. 2. 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 Electronically Signed   By: VEAR Hurst M.D.   On: 02/21/2023 09:15   CT Maxillofacial Wo Contrast Result Date: 02/21/2023 CLINICAL DATA:  83 year old male status post fall yesterday, legs gave out. Abrasions. Blurred vision. EXAM: CT MAXILLOFACIAL WITHOUT CONTRAST TECHNIQUE: Multidetector CT imaging of the maxillofacial structures was performed. Multiplanar CT image reconstructions were also generated. RADIATION DOSE REDUCTION: This exam was performed according to the departmental dose-optimization program which includes automated exposure control, adjustment of the mA and/or kV according to patient size and/or use of iterative reconstruction technique. COMPARISON:  CT head and cervical spine today. Prior face CT 11/02/2022. FINDINGS: Osseous: Mandible intact and normally located. Stable dentition. Bilateral maxilla, zygoma, pterygoid and nasal bones appear stable and intact. Central skull base appears intact. Orbits: No acute orbital wall fracture. Chronic postoperative changes to the globes. No acute orbit soft tissue finding. Sinuses: Visualized paranasal sinuses and mastoids are stable and well aerated. Soft tissues: Negative visible noncontrast larynx, pharynx, parapharyngeal spaces, retropharyngeal space, sublingual space. Partial submandibular gland atrophy is stable. Masticator and parotid spaces appear negative.  Calcified atherosclerosis. No soft  tissue gas or superficial soft tissue injury identified other than the right forehead reported separately. Limited intracranial: Stable to that reported separately. IMPRESSION: No acute traumatic injury identified in the Face. Electronically Signed   By: VEAR Hurst M.D.   On: 02/21/2023 09:13   CT Head Wo Contrast Result Date: 02/21/2023 CLINICAL DATA:  83 year old male status post fall yesterday, legs gave out. Abrasions. Blurred vision. EXAM: CT HEAD WITHOUT CONTRAST TECHNIQUE: Contiguous axial images were obtained from the base of the skull through the vertex without intravenous contrast. RADIATION DOSE REDUCTION: This exam was performed according to the departmental dose-optimization program which includes automated exposure control, adjustment of the mA and/or kV according to patient size and/or use of iterative reconstruction technique. COMPARISON:  Brain MRI 01/18/2023 and earlier. Face and cervical spine CT today reported separately. FINDINGS: Brain: Stable cerebral volume. Stable basal ganglia vascular calcifications. No midline shift, mass effect, No midline shift, ventriculomegaly, mass effect, evidence of mass lesion, intracranial hemorrhage or evidence of cortically based acute infarction. Stable gray-white matter differentiation throughout the brain. Mild for age white matter hypodensity. Vascular: Calcified atherosclerosis at the skull base. No suspicious intracranial vascular hyperdensity. Skull: Stable.  No fracture identified. Sinuses/Orbits: Visualized paranasal sinuses and mastoids are stable and well aerated. Other: Mild right forehead scalp hematoma or contusion series 3, image 18. Underlying right frontal bone intact. No scalp soft tissue gas. IMPRESSION: 1. Mild right forehead scalp hematoma or contusion. No skull fracture. 2. No acute intracranial abnormality. Stable mild for age chronic cerebral white matter changes. Electronically Signed   By: VEAR Hurst  M.D.   On: 02/21/2023 09:09   PHYSICAL EXAM  Temp:  [97.4 F (36.3 C)-100 F (37.8 C)] 97.4 F (36.3 C) (01/04 1118) Pulse Rate:  [73-97] 73 (01/04 1118) Resp:  [18-19] 18 (01/04 1118) BP: (119-154)/(58-66) 122/62 (01/04 1118) SpO2:  [93 %-98 %] 98 % (01/04 1118)  General - Well nourished, well developed, in no apparent distress.  Cardiovascular - Regular rhythm and rate.  Neuro: Mental Status: Patient is awake, alert, oriented to person, place, month, year, and situation. Patient is able to give a clear and coherent history. No signs of aphasia or neglect Cranial Nerves: II: Visual Fields are full. Pupils are equal, round, and reactive to light.  Glasses at baseline III,IV, VI: EOMI without ptosis or diploplia.  V: Facial sensation is symmetric to light touch VII: Facial movement is symmetric.  VIII: hearing is intact to voice X: Uvula elevates symmetrically.  Phonation intact XI: Shoulder shrug is symmetric. XII: tongue is midline  Motor: Tone is normal. Bulk is normal.  BUE: No drift present, 5/5 RLE: very slight drift when compared to LLE LLE: no drift Sensory: Sensation is symmetric to light touch in the arms and legs. Cerebellar: FNF ataxic on RUE. HKS are intact bilaterally Gait: Deferred   ASSESSMENT/PLAN Mr. Harry Zamora is a 83 y.o. male with history of HTN, HLD, DM, prostate Ca, LBP admitted for fell at home, right sided weakness ataxia. TNK was given.    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 Left VA origin moderate stenosis MRI no acute infarct MRI C-spine cervical spinal cord edema/contusion at C3-5, concerning for contusion 2D Echo: LVEF 55 to 60% LDL 19 HgbA1c: 7.5 SCDs for VTE prophylaxis clopidogrel  75 mg daily prior to admission,  now on ASA 81 with stable Hb. NSG consulted and  recommend soft C-collar Ongoing aggressive stroke risk factor management Therapy recommendations:  CIR Disposition:  pending   Hx of hypertension Orthostatic hypotension Stable on the low end Orthostatic vitals - supine 122/59, sitting 85/49. standing 66/49 but asymptomaticd Encourage po intake Continue TED hose Continue midodrine  5mg  tid from 8am to 5pm Off IVF BP goal < 180/105 Long term BP goal normotensive  Acute anemia  Scalp laceration with prior bleeding Fall with scalp laceration -> bleeding after TNK -> now stopped Hb 11.1->8.0->8.1->8.7->8.2->8.1->8.8->8.2 CT C/A/P x 2 - No acute intrathoracic, intra-abdominal, intrapelvic traumatic injury. No pelvic sidewall or retroperitoneal hematoma. Asymmetry of the gluteus musculature inferiorly with asymmetric enlargement on the left - no hematoma on exam Continue CBC monitoring Put on ASA 81 today   Diabetes HgbA1c 7.5 goal < 7.0 Uncontrolled CBG monitoring SSI DM education and close PCP follow up  Hyperlipidemia Home meds:  crestor  40  LDL 19, goal < 70 Now decreased to Crestor  20 Continue statin at discharge  Other Stroke Risk Factors Advanced age  Other Active Problems LBP Prostate cancer  Hospital day # 6  Ary Cummins, MD PhD Stroke Neurology 03/20/2023 1:01 PM   To contact Stroke Continuity provider, please refer to [email or web address]. After hours, contact General Neurology

## 2023-03-20 NOTE — Plan of Care (Signed)
  Problem: Education: Goal: Knowledge of disease or condition will improve Outcome: Progressing   Problem: Coping: Goal: Will verbalize positive feelings about self Outcome: Progressing Goal: Will identify appropriate support needs Outcome: Progressing   Problem: Nutrition: Goal: Risk of aspiration will decrease Outcome: Progressing   Problem: Education: Goal: Knowledge of General Education information will improve Description: Including pain rating scale, medication(s)/side effects and non-pharmacologic comfort measures Outcome: Progressing   Problem: Activity: Goal: Risk for activity intolerance will decrease Outcome: Progressing   Problem: Nutrition: Goal: Adequate nutrition will be maintained Outcome: Progressing   Problem: Elimination: Goal: Will not experience complications related to bowel motility Outcome: Progressing Goal: Will not experience complications related to urinary retention Outcome: Progressing

## 2023-03-20 NOTE — Progress Notes (Deleted)
 Patient complains of missing her cell phone, this RN saw a phone with her earlier this morning, but could not find when searched everywhere in the room.

## 2023-03-20 NOTE — Plan of Care (Signed)
  Problem: Education: Goal: Knowledge of patient specific risk factors will improve Harry Zamora N/A or DELETE if not current risk factor) Outcome: Progressing   Problem: Coping: Goal: Will verbalize positive feelings about self Outcome: Progressing   Problem: Self-Care: Goal: Ability to participate in self-care as condition permits will improve Outcome: Progressing

## 2023-03-21 LAB — URINALYSIS, COMPLETE (UACMP) WITH MICROSCOPIC
Bilirubin Urine: NEGATIVE
Glucose, UA: NEGATIVE mg/dL
Ketones, ur: 5 mg/dL — AB
Leukocytes,Ua: NEGATIVE
Nitrite: NEGATIVE
Protein, ur: 30 mg/dL — AB
Specific Gravity, Urine: 1.017 (ref 1.005–1.030)
pH: 5 (ref 5.0–8.0)

## 2023-03-21 LAB — CBC
HCT: 24.5 % — ABNORMAL LOW (ref 39.0–52.0)
Hemoglobin: 8.2 g/dL — ABNORMAL LOW (ref 13.0–17.0)
MCH: 30.6 pg (ref 26.0–34.0)
MCHC: 33.5 g/dL (ref 30.0–36.0)
MCV: 91.4 fL (ref 80.0–100.0)
Platelets: 301 10*3/uL (ref 150–400)
RBC: 2.68 MIL/uL — ABNORMAL LOW (ref 4.22–5.81)
RDW: 13 % (ref 11.5–15.5)
WBC: 8.8 10*3/uL (ref 4.0–10.5)
nRBC: 0 % (ref 0.0–0.2)

## 2023-03-21 LAB — BASIC METABOLIC PANEL
Anion gap: 12 (ref 5–15)
BUN: 28 mg/dL — ABNORMAL HIGH (ref 8–23)
CO2: 25 mmol/L (ref 22–32)
Calcium: 8.5 mg/dL — ABNORMAL LOW (ref 8.9–10.3)
Chloride: 99 mmol/L (ref 98–111)
Creatinine, Ser: 1.27 mg/dL — ABNORMAL HIGH (ref 0.61–1.24)
GFR, Estimated: 56 mL/min — ABNORMAL LOW (ref >60)
Glucose, Bld: 152 mg/dL — ABNORMAL HIGH (ref 70–99)
Potassium: 3.3 mmol/L — ABNORMAL LOW (ref 3.5–5.1)
Sodium: 136 mmol/L (ref 135–145)

## 2023-03-21 LAB — GLUCOSE, CAPILLARY
Glucose-Capillary: 140 mg/dL — ABNORMAL HIGH (ref 70–99)
Glucose-Capillary: 163 mg/dL — ABNORMAL HIGH (ref 70–99)
Glucose-Capillary: 139 mg/dL — ABNORMAL HIGH (ref 70–99)
Glucose-Capillary: 156 mg/dL — ABNORMAL HIGH (ref 70–99)
Glucose-Capillary: 176 mg/dL — ABNORMAL HIGH (ref 70–99)

## 2023-03-21 MED ORDER — POTASSIUM CHLORIDE CRYS ER 20 MEQ PO TBCR
40.0000 meq | EXTENDED_RELEASE_TABLET | ORAL | Status: AC
  Administered 2023-03-21 (×2): 40 meq via ORAL
  Filled 2023-03-21 (×2): qty 2

## 2023-03-21 NOTE — Plan of Care (Signed)
  Problem: Education: Goal: Knowledge of disease or condition will improve Outcome: Progressing   Problem: Ischemic Stroke/TIA Tissue Perfusion: Goal: Complications of ischemic stroke/TIA will be minimized Outcome: Progressing   Problem: Coping: Goal: Will verbalize positive feelings about self Outcome: Progressing Goal: Will identify appropriate support needs Outcome: Progressing   Problem: Nutrition: Goal: Risk of aspiration will decrease Outcome: Progressing Goal: Dietary intake will improve Outcome: Progressing   Problem: Education: Goal: Knowledge of General Education information will improve Description: Including pain rating scale, medication(s)/side effects and non-pharmacologic comfort measures Outcome: Progressing   Problem: Clinical Measurements: Goal: Respiratory complications will improve Outcome: Progressing Goal: Cardiovascular complication will be avoided Outcome: Progressing   Problem: Skin Integrity: Goal: Risk for impaired skin integrity will decrease Outcome: Progressing

## 2023-03-21 NOTE — Progress Notes (Signed)
 STROKE TEAM PROGRESS NOTE   SUBJECTIVE (INTERVAL HISTORY) No family is at the bedside. Pt lying in bed, AAO x 3, no complains. On soft C-collar. RN reported pt has low appetite, encouraged po intake. Had bowel movement. Hb stable on ASA. K low, will give supplement   OBJECTIVE Temp:  [97.4 F (36.3 C)-98.8 F (37.1 C)] 97.4 F (36.3 C) (01/05 1615) Pulse Rate:  [74-103] 74 (01/05 1615) Cardiac Rhythm: Normal sinus rhythm (01/05 0700) Resp:  [18-19] 18 (01/05 1615) BP: (114-142)/(56-67) 142/67 (01/05 1615) SpO2:  [93 %-98 %] 93 % (01/05 1615)  Recent Labs  Lab 03/20/23 2127 03/21/23 0624 03/21/23 0823 03/21/23 1111 03/21/23 1613  GLUCAP 114* 140* 163* 139* 156*   Recent Labs  Lab 03/17/23 0729 03/18/23 0728 03/19/23 0722 03/20/23 0559 03/21/23 0643  NA 134* 136 135 136 136  K 3.7 3.8 3.4* 3.4* 3.3*  CL 100 100 100 100 99  CO2 23 25 22 23 25   GLUCOSE 162* 146* 162* 155* 152*  BUN 29* 30* 33* 33* 28*  CREATININE 1.62* 1.55* 1.50* 1.39* 1.27*  CALCIUM  8.3* 8.3* 8.4* 8.3* 8.5*   No results for input(s): AST, ALT, ALKPHOS, BILITOT, PROT, ALBUMIN  in the last 168 hours.  Recent Labs  Lab 03/17/23 0729 03/18/23 0728 03/19/23 0722 03/20/23 0559 03/21/23 0643  WBC 5.5 6.8 11.6* 11.4* 8.8  HGB 8.2* 8.1* 8.8* 8.2* 8.2*  HCT 24.6* 23.9* 26.6* 24.2* 24.5*  MCV 92.5 91.9 93.0 92.4 91.4  PLT 155 191 256 276 301   No results for input(s): CKTOTAL, CKMB, CKMBINDEX, TROPONINI in the last 168 hours. No results for input(s): LABPROT, INR in the last 72 hours.  Recent Labs    03/21/23 1120  COLORURINE YELLOW  LABSPEC 1.017  PHURINE 5.0  GLUCOSEU NEGATIVE  HGBUR SMALL*  BILIRUBINUR NEGATIVE  KETONESUR 5*  PROTEINUR 30*  NITRITE NEGATIVE  LEUKOCYTESUR NEGATIVE       Component Value Date/Time   CHOL 61 03/14/2023 0405   CHOL 137 10/03/2020 1138   TRIG 47 03/14/2023 0405   HDL 33 (L) 03/14/2023 0405   HDL 33 (L) 10/03/2020 1138   CHOLHDL  1.8 03/14/2023 0405   VLDL 9 03/14/2023 0405   LDLCALC 19 03/14/2023 0405   LDLCALC 82 10/03/2020 1138   Lab Results  Component Value Date   HGBA1C 7.5 (H) 03/15/2023   No results found for: LABOPIA, COCAINSCRNUR, LABBENZ, AMPHETMU, THCU, LABBARB  No results for input(s): ETH in the last 168 hours.  I have personally reviewed the radiological images below and agree with the radiology interpretations.  DG CHEST PORT 1 VIEW Result Date: 03/20/2023 CLINICAL DATA:  Leukocytosis. EXAM: PORTABLE CHEST 1 VIEW COMPARISON:  Radiographs 03/14/2023 and 11/01/2022. Chest CT 03/14/2023 and abdominal CT 03/15/2023. FINDINGS: 0633 hours. The heart size and mediastinal contours are stable with aortic atherosclerosis. There are lower lung volumes with increased patchy bibasilar airspace opacities compared with the 03/14/2023 radiographs. These are similar to the more recent abdominal CT of 03/15/2023 and suspicious for bronchopneumonia or aspiration. No pleural effusion or pneumothorax. The bones appear unchanged. IMPRESSION: Lower lung volumes with increased patchy bibasilar airspace opacities suspicious for bronchopneumonia or aspiration. Electronically Signed   By: Elsie Perone M.D.   On: 03/20/2023 10:27   MR CERVICAL SPINE WO CONTRAST Result Date: 03/18/2023 CLINICAL DATA:  Ataxia, cervical trauma. EXAM: MRI CERVICAL SPINE WITHOUT CONTRAST TECHNIQUE: Multiplanar, multisequence MR imaging of the cervical spine was performed. No intravenous contrast was administered. COMPARISON:  Cervical  spine CT 03/14/2023 FINDINGS: Alignment: Mild reversal of the normal cervical lordosis. Trace anterolisthesis of C3 on C4 and C4 on C5, unchanged. Vertebrae: No acute fracture or suspicious marrow lesion. Cord: Abnormal confluent T2 hyperintensity in the right aspect of the spinal cord extending from the superior C3 level to the C4-5 disc space level, greatest at C4. No evidence of cord hemorrhage on gradient  echo imaging. Small amount of abnormal ventral STIR hyperintense signal behind the C3 vertebral body and C2-3 disc space measuring up to 2 mm in thickness and suspicious for a small epidural hematoma. Possible trace ventral and dorsal epidural hematoma at C4 and C5. Possible posterior longitudinal ligament disruption at C2-3. Posterior Fossa, vertebral arteries, paraspinal tissues: No significant prevertebral fluid or soft tissue swelling. Preserved vertebral artery flow voids. 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 C2-3: Mild uncovertebral spurring, asymmetrically severe left facet arthrosis with facet ankylosis, and suspected small volume epidural hematoma result in mild spinal stenosis and mild left neural foraminal stenosis. C3-4: Disc bulging, uncovertebral spurring, suspected small volume epidural hematoma, and moderate right and severe left facet arthrosis result in moderate spinal stenosis and mild right and severe left neural foraminal stenosis. C4-5: Disc bulging, uncovertebral spurring, and moderate to severe right and mild left facet arthrosis result in moderate spinal stenosis and mild-to-moderate right neural foraminal stenosis. C5-6: Disc bulging and uncovertebral spurring result in moderate spinal stenosis without significant neural foraminal stenosis. C6-7: Mild disc bulging without significant stenosis. C7-T1: Negative. These results will be called to the ordering clinician or representative by the Radiologist Assistant, and communication documented in the PACS or [employer]. IMPRESSION: 1. Abnormal cord signal at C3-4 and C4-5 compatible with edema/contusion. No evidence of cord hemorrhage. 2. Suspected small volume epidural hematoma in the upper cervical spine with possible PLL disruption at C2-3. 3. 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 Electronically Signed   By: Dasie Hamburg M.D.   On: 03/18/2023 19:48   ECHOCARDIOGRAM COMPLETE Result Date: 03/15/2023    ECHOCARDIOGRAM REPORT   Patient Name:   ASSAD HARBESON Date of Exam: 03/15/2023 Medical Rec #:  [id number]      Height:       74.0 in Accession #:    [id number]     Weight:       148.6 lb Date of Birth:  02/20/1941       BSA:          1.916 m Patient Age:    82 years       BP:           116/51 mmHg Patient Gender: M              HR:           90 bpm. Exam Location:  Inpatient Procedure: 2D Echo, 3D Echo, Cardiac Doppler, Color Doppler and Strain Analysis Indications:    Stroke  History:        Patient has no prior history of Echocardiogram examinations.                 Risk Factors:Hypertension, Diabetes, Dyslipidemia and Current                 Smoker.  Sonographer:    Ozell Free Referring Phys: [id number] ASHISH ARORA  Sonographer Comments: Global longitudinal strain was attempted. IMPRESSIONS  1. Left ventricular ejection fraction, by estimation,  is 55 to 60%. The left ventricle has normal function. The left ventricle has no regional wall motion abnormalities. Left ventricular diastolic parameters were normal.  2. Right ventricular systolic function is normal. The right ventricular size is normal. There is moderately elevated pulmonary artery systolic pressure.  3. The mitral valve is normal in structure. No evidence of mitral valve regurgitation. No evidence of mitral stenosis.  4. The aortic valve is normal in structure. Aortic valve regurgitation is not visualized. No aortic stenosis is present.  5. The inferior vena cava is dilated in size with >50% respiratory variability, suggesting right atrial pressure of 8 mmHg. FINDINGS  Left Ventricle: Left ventricular ejection fraction, by estimation, is 55 to 60%. The left ventricle has normal function. The left ventricle has no regional wall motion abnormalities. The left ventricular internal cavity size was normal in size. There is   no left ventricular hypertrophy. Left ventricular diastolic parameters were normal. Right Ventricle: The right ventricular size is normal. No increase in right ventricular wall thickness. Right ventricular systolic function is normal. There is moderately elevated pulmonary artery systolic pressure. The tricuspid regurgitant velocity is 2.94 m/s, and with an assumed right atrial pressure of 15 mmHg, the estimated right ventricular systolic pressure is 49.6 mmHg. Left Atrium: Left atrial size was normal in size. Right Atrium: Right atrial size was normal in size. Pericardium: There is no evidence of pericardial effusion. Mitral Valve: The mitral valve is normal in structure. No evidence of mitral valve regurgitation. No evidence of mitral valve stenosis. Tricuspid Valve: The tricuspid valve is normal in structure. Tricuspid valve regurgitation is trivial. No evidence of tricuspid stenosis. Aortic Valve: The aortic valve is normal in structure. Aortic valve regurgitation is not visualized. No aortic stenosis is present. Aortic valve mean gradient measures 5.0 mmHg. Aortic valve peak gradient measures 9.5 mmHg. Aortic valve area, by VTI measures 2.57 cm. Pulmonic Valve: The pulmonic valve was normal in structure. Pulmonic valve regurgitation is not visualized. No evidence of pulmonic stenosis. Aorta: The aortic root is normal in size and structure. Venous: The inferior vena cava is dilated in size with greater than 50% respiratory variability, suggesting right atrial pressure of 8 mmHg. IAS/Shunts: No atrial level shunt detected by color flow Doppler.  LEFT VENTRICLE PLAX 2D LVIDd:         4.40 cm   Diastology LVIDs:         2.80 cm   LV e' medial:    8.92 cm/s LV PW:         1.00 cm   LV E/e' medial:  8.4 LV IVS:        1.00 cm   LV e' lateral:   9.57 cm/s LVOT diam:     2.20 cm   LV E/e' lateral: 7.8 LV SV:         68 LV SV Index:   36 LVOT Area:     3.80 cm                           3D Volume EF:                           3D EF:        53 %                          LV EDV:  150 ml                          LV ESV:       70 ml                          LV SV:        80 ml RIGHT VENTRICLE             IVC RV Basal diam:  4.40 cm     IVC diam: 2.30 cm RV S prime:     15.30 cm/s TAPSE (M-mode): 3.4 cm LEFT ATRIUM           Index        RIGHT ATRIUM           Index LA diam:      3.40 cm 1.77 cm/m   RA Area:     17.80 cm LA Vol (A2C): 59.8 ml 31.22 ml/m  RA Volume:   50.70 ml  26.47 ml/m LA Vol (A4C): 55.8 ml 29.13 ml/m  AORTIC VALVE AV Area (Vmax):    2.24 cm AV Area (Vmean):   2.24 cm AV Area (VTI):     2.57 cm AV Vmax:           154.00 cm/s AV Vmean:          106.000 cm/s AV VTI:            0.265 m AV Peak Grad:      9.5 mmHg AV Mean Grad:      5.0 mmHg LVOT Vmax:         90.70 cm/s LVOT Vmean:        62.400 cm/s LVOT VTI:          0.179 m LVOT/AV VTI ratio: 0.68  AORTA Ao Root diam: 3.60 cm Ao Asc diam:  3.00 cm MITRAL VALVE               TRICUSPID VALVE MV Area (PHT): 3.68 cm    TR Peak grad:   34.6 mmHg MV Decel Time: 206 msec    TR Vmax:        294.00 cm/s MV E velocity: 74.90 cm/s MV A velocity: 94.30 cm/s  SHUNTS MV E/A ratio:  0.79        Systemic VTI:  0.18 m                            Systemic Diam: 2.20 cm Aditya Sabharwal Electronically signed by Ria Commander Signature Date/Time: 03/15/2023/10:55:31 AM    Final    CT ABDOMEN PELVIS WO CONTRAST Result Date: 03/15/2023 CLINICAL DATA:  Abdominal pain. Acute decrease in hemoglobin with tender abdomen. Evaluate for hematoma EXAM: CT ABDOMEN AND PELVIS WITHOUT CONTRAST TECHNIQUE: Multidetector CT imaging of the abdomen and pelvis was performed following the standard protocol without IV contrast. RADIATION DOSE REDUCTION: This exam was performed according to the departmental dose-optimization program which includes automated exposure control, adjustment of the mA and/or kV according to patient size and/or use of iterative reconstruction technique.  COMPARISON:  03/14/2023 FINDINGS: Lower chest: Dependent collapse/consolidation in both lower lobes is progressive in the interval with areas of peripheral airway impaction in the dependent lower lungs. Hepatobiliary: No suspicious focal abnormality in the liver on this study without intravenous contrast. Cholecystectomy. No intrahepatic or extrahepatic biliary dilation. Pancreas: No focal  mass lesion. No dilatation of the main duct. No intraparenchymal cyst. Diffuse atrophy. Spleen: No splenomegaly. No suspicious focal mass lesion. Adrenals/Urinary Tract: No adrenal nodule or mass. Small nonobstructing stones are evident in both kidneys. High attenuation in the renal pelvis of each kidney in the ureters and the bladder lumen is compatible with excreted contrast from yesterday's CT scan. No hydroureteronephrosis. Bladder lumen opacified but otherwise unremarkable. Stomach/Bowel: Stomach is unremarkable. No gastric wall thickening. No evidence of outlet obstruction. Duodenum is normally positioned as is the ligament of Treitz. No small bowel wall thickening. No small bowel dilatation. The terminal ileum is normal. The appendix is normal. The tip of the appendix appears to be filled with soft tissue (51/3) without distension. This finding is stable comparing back to a CT from 03/24/2021 but appears minimally progressive when comparing to 09/12/2020. No gross colonic mass. No colonic wall thickening. Moderate to large stool volume evident. Vascular/Lymphatic: There is moderate atherosclerotic calcification of the abdominal aorta without aneurysm. There is no gastrohepatic or hepatoduodenal ligament lymphadenopathy. No retroperitoneal or mesenteric lymphadenopathy. No pelvic sidewall lymphadenopathy. 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 Other: No intraperitoneal free fluid. Musculoskeletal: No pelvic sidewall or retroperitoneal hematoma. There is some minimal thickening of the right posterior  renal fascia, stable to minimally progressive since 03/14/2023, but this is only minimal and is nonspecific, not representing a discrete hematoma or substantial hemorrhage. Lumbar fusion hardware evident. The patient is noted to have asymmetry of the gluteus musculature inferiorly with asymmetric enlargement on the left although this is stable to minimally increased compared to 03/14/2023 and has been incompletely visualized. IMPRESSION: 1. No pelvic sidewall or retroperitoneal hematoma. There is some minimal thickening of the right posterior renal fascia, stable to minimally progressive since 03/14/2023, but this is only minimal and is nonspecific, not representing a discrete hematoma or substantial hemorrhage. 2. Asymmetry of the gluteus musculature inferiorly with asymmetric enlargement on the left although this is stable to may be minimally increased compared to 03/14/2023 and has been incompletely visualized. There is not a discrete hematoma a discernible on this study but a component of intramuscular hemorrhage could have this appearance. This would likely be amenable to clinical inspection. 3. Dependent collapse/consolidation in both lower lobes is progressive in the interval with areas of peripheral airway impaction in the dependent lower lungs. Imaging features compatible may be infectious although aspiration could have this appearance. 4. The tip of the appendix appears to be filled with soft tissue without distension. This finding is stable comparing back to a CT from 03/24/2021 but appears minimally progressive when comparing to 09/12/2020. This is a nonspecific finding and may be related to mucocele. Follow-up CT in 6 months recommended. Non emergent follow-up surgical consultation recommended. 5. Small nonobstructing stones in both kidneys. 6.  Aortic Atherosclerosis (ICD10-I70.0). Electronically Signed   By: Camellia Candle M.D.   On: 03/15/2023 06:24   MR BRAIN WO CONTRAST Result Date:  03/15/2023 CLINICAL DATA:  Stroke/TIA.  Status post T NK. EXAM: MRI HEAD WITHOUT CONTRAST TECHNIQUE: Multiplanar, multiecho pulse sequences of the brain and surrounding structures were obtained without intravenous contrast. COMPARISON:  01/18/2023 FINDINGS: Brain: No acute infarct, mass effect or extra-axial collection. 3-5 chronic microhemorrhages in the cerebellum. There is multifocal hyperintense T2-weighted signal within the white matter. Parenchymal volume and CSF spaces are normal. The midline structures are normal. Vascular: Normal flow voids. Skull and upper cervical spine: Posterior scalp subgaleal collection. Sinuses/Orbits:No paranasal sinus fluid levels or advanced mucosal thickening. No  mastoid or middle ear effusion. Normal orbits. IMPRESSION: 1. No acute intracranial abnormality. 2. Findings of chronic small vessel ischemia. 3. Posterior scalp subgaleal hematoma. Electronically Signed   By: Franky Stanford M.D.   On: 03/15/2023 00:01   CT HEAD WO CONTRAST ( ) Result Date: 03/14/2023 CLINICAL DATA:  Stroke, follow-up. Persistent right upper extremity ataxia and right lower extremity dysmetria. Status post TNK. EXAM: CT HEAD WITHOUT CONTRAST TECHNIQUE: Contiguous axial images were obtained from the base of the skull through the vertex without intravenous contrast. RADIATION DOSE REDUCTION: This exam was performed according to the departmental dose-optimization program which includes automated exposure control, adjustment of the mA and/or kV according to patient size and/or use of iterative reconstruction technique. COMPARISON:  CT head and CT angio head and neck 03/14/2023 at 12:15 a.m. FINDINGS: Brain: Mild atrophy and white matter disease is stable. No acute infarct or hemorrhage is present. Deep brain nuclei are within normal limits. The ventricles are proportionate to the degree of atrophy. No significant extraaxial fluid collection is present. The brainstem and cerebellum are within normal  limits. Midline structures are within normal limits. Vascular: Atherosclerotic calcifications are again noted within the cavernous internal carotid arteries bilaterally and at the dural margin of the right vertebral artery. No hyperdense vessel is present. Skull: A left parietal and occipital scalp hematoma is new. No acute fracture is present. Sinuses/Orbits: The paranasal sinuses and mastoid air cells are clear. Bilateral lens replacements are noted. Globes and orbits are otherwise unremarkable. IMPRESSION: 1. No acute intracranial abnormality or significant interval change. 2. Stable atrophy and white matter disease. This likely reflects the sequela of chronic microvascular ischemia. 3. New left parietal and occipital scalp hematoma without underlying fracture. Electronically Signed   By: Lonni Necessary M.D.   On: 03/14/2023 17:54   CT CHEST ABDOMEN PELVIS W CONTRAST Result Date: 03/14/2023 CLINICAL DATA:  Chest trauma, blunt Abdominal trauma, blunt. Stroke, fall. EXAM: CT CHEST, ABDOMEN, AND PELVIS WITH CONTRAST TECHNIQUE: Multidetector CT imaging of the chest, abdomen and pelvis was performed following the standard protocol during bolus administration of intravenous contrast. RADIATION DOSE REDUCTION: This exam was performed according to the departmental dose-optimization program which includes automated exposure control, adjustment of the mA and/or kV according to patient size and/or use of iterative reconstruction technique. CONTRAST:  75mL OMNIPAQUE  IOHEXOL  350 MG/ML SOLN COMPARISON:  CT angio chest 11/18/2022, CT angio chest 03/18/2021 FINDINGS: CHEST: Cardiovascular: No aortic injury. The thoracic aorta is normal in caliber. The heart is normal in size. No significant pericardial effusion. Moderate atherosclerotic plaque. At least 2 vessel coronary artery calcification. The main pulmonary artery is normal in caliber. No central or segmental pulmonary embolus. Limited evaluation more distally due  to timing of contrast and motion artifact. Mediastinum/Nodes: No pneumomediastinum. No mediastinal hematoma. The esophagus is unremarkable. The thyroid  is unremarkable. The central airways are patent. No mediastinal, hilar, or axillary lymphadenopathy. Lungs/Pleura: Bilateral lower lobe bronchial wall thickening. Bilateral lower lobe mucous plugging, right greater than left. Bibasilar atelectasis. Left lower lobe ground-glass airspace opacity. Stable left lower lobe calcified pulmonary micronodule-no further follow-up indicated. No pulmonary mass. No pulmonary contusion or laceration. No pneumatocele formation. No pleural effusion. No pneumothorax. No hemothorax. Musculoskeletal/Chest wall: No chest wall mass. No acute rib or sternal fracture. No spinal fracture. ABDOMEN / PELVIS: Hepatobiliary: Not enlarged. No focal lesion. No laceration or subcapsular hematoma. Status post cholecystectomy.  No biliary ductal dilatation. Pancreas: Normal pancreatic contour. No main pancreatic duct dilatation. Spleen: Not enlarged. No focal  lesion. No laceration, subcapsular hematoma, or vascular injury. Adrenals/Urinary Tract: No nodularity bilaterally. Bilateral kidneys enhance and excrete symmetrically. No hydronephrosis. No contusion, laceration, or subcapsular hematoma. No injury to the vascular structures or collecting systems. No hydroureter. The urinary bladder is unremarkable. Stomach/Bowel: No small or large bowel wall thickening or dilatation. Stool throughout the colon. The appendix is unremarkable. Vasculature/Lymphatics: Severe atherosclerotic plaque no abdominal aorta or iliac aneurysm. No active contrast extravasation or pseudoaneurysm. No abdominal, pelvic, inguinal lymphadenopathy. Reproductive: Radiation seeds along the prostate. Other: No simple free fluid ascites. No pneumoperitoneum. No hemoperitoneum. No mesenteric hematoma identified. No organized fluid collection. Musculoskeletal: No significant soft tissue  hematoma. No acute pelvic fracture. No spinal fracture. L2-S2 posterolateral interbody surgical hardware fusion. Ports and Devices: None. IMPRESSION: 1. No acute intrathoracic, intra-abdominal, intrapelvic traumatic injury. 2. No acute fracture or traumatic malalignment of the thoracic or lumbar spine. 3. Other imaging findings of potential clinical significance: Bilateral lower lobe bronchial wall thickening with bilateral mucous plugging and patchy ground-glass airspace opacity left lower lobe-question developing infection/inflammation. Stool throughout the colon-correlate for constipation. Aortic Atherosclerosis (ICD10-I70.0)-severe. Electronically Signed   By: Morgane  Naveau M.D.   On: 03/14/2023 03:34   DG Chest Port 1 View Result Date: 03/14/2023 CLINICAL DATA:  Recent fall EXAM: PORTABLE CHEST 1 VIEW COMPARISON:  11/01/2022 FINDINGS: Cardiac shadows within normal limits. Aortic calcifications are noted. Skin fold is noted over the right chest. Minimal left basilar atelectasis is seen. No focal confluent infiltrate is noted. No bony abnormality is noted. IMPRESSION: Mild left basilar atelectasis. Electronically Signed   By: Oneil Devonshire M.D.   On: 03/14/2023 00:58   CT ANGIO HEAD NECK W WO CM (CODE STROKE) Result Date: 03/14/2023 CLINICAL DATA:  Right arm weakness, right pupil blown, head trauma EXAM: CT ANGIOGRAPHY HEAD AND NECK WITH AND WITHOUT CONTRAST TECHNIQUE: Multidetector CT imaging of the head and neck was performed using the standard protocol during bolus administration of intravenous contrast. Multiplanar CT image reconstructions and MIPs were obtained to evaluate the vascular anatomy. Carotid stenosis measurements (when applicable) are obtained utilizing NASCET criteria, using the distal internal carotid diameter as the denominator. RADIATION DOSE REDUCTION: This exam was performed according to the departmental dose-optimization program which includes automated exposure control, adjustment  of the mA and/or kV according to patient size and/or use of iterative reconstruction technique. CONTRAST:  75mL OMNIPAQUE  IOHEXOL  350 MG/ML SOLN COMPARISON:  09/13/2022 CTA head and neck, correlation is made with 03/14/2023 CT head FINDINGS: CT HEAD FINDINGS For noncontrast findings, please see same day CT head. CTA NECK FINDINGS Aortic arch: Standard branching. Imaged portion shows no evidence of aneurysm or dissection. No significant stenosis of the major arch vessel origins. Aortic atherosclerosis Right carotid system: No evidence of dissection, occlusion, or hemodynamically significant stenosis (greater than 50%). Left carotid system: No evidence of dissection, occlusion, or hemodynamically significant stenosis (greater than 50%). Vertebral arteries: Moderate stenosis at the origin of the left vertebral artery. Evaluation of the proximal left vertebral artery is limited by beam hardening artifact from the adjacent contrast bolus. The left vertebral artery is otherwise patent to the skull base. Mild stenosis at the origin of the right vertebral artery. The right vertebral artery is otherwise patent to the skull base. No evidence of dissection. Skeleton: No acute osseous abnormality. Degenerative changes in the cervical spine. Other neck: No acute finding. Upper chest: No focal pulmonary opacity or pleural effusion. Review of the MIP images confirms the above findings CTA HEAD FINDINGS Anterior circulation:  Both internal carotid arteries are patent to the termini, with mild stenosis in the bilateral cavernous segments and moderate to severe stenosis in the proximal supraclinoid segments bilaterally. A1 segments patent. Normal anterior communicating artery. Anterior cerebral arteries are patent to their distal aspects without significant stenosis. No M1 stenosis or occlusion. MCA branches are irregular but perfused to their distal aspects without significant stenosis. Posterior circulation: Vertebral arteries  patent to the vertebrobasilar junction, with mild stenosis in the proximal right V4. The left vertebral artery primarily supplies the left PICA, and is quite diminutive after the PICA takeoff. Posterior inferior cerebellar arteries patent proximally. Basilar patent to its distal aspect without significant stenosis. Superior cerebellar arteries patent proximally. 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 Redemonstrated severe stenosis in the proximal left P2 (series 7, images 98-99), with multifocal irregularity in the remainder of the left P2. Evaluation of the more distal PCAs is limited by venous contamination. The bilateral posterior communicating arteries are not definitively seen. Venous sinuses: As permitted by contrast timing, patent. Anatomic variants: None significant. No evidence of aneurysm or vascular malformation. Review of the MIP images confirms the above findings IMPRESSION: 1. No intracranial large vessel occlusion. 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 2. 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 3. 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 4. Moderate stenosis at the origin of the left vertebral artery and mild stenosis at the origin of the right vertebral artery. 5. Aortic atherosclerosis. Aortic Atherosclerosis (ICD10-I70.0). Electronically Signed   By: Donald Campion M.D.   On: 03/14/2023 00:46   CT Cervical Spine Wo Contrast Result Date: 03/14/2023 CLINICAL DATA:  Neck trauma, fell backwards EXAM: CT CERVICAL SPINE WITHOUT CONTRAST TECHNIQUE: Multidetector CT imaging of the cervical spine was performed without intravenous contrast.  Multiplanar CT image reconstructions were also generated. RADIATION DOSE REDUCTION: This exam was performed according to the departmental dose-optimization program which includes automated exposure control, adjustment of the mA and/or kV according to patient size and/or use of iterative reconstruction technique. COMPARISON:  02/21/2023 CT cervical spine FINDINGS: Alignment: No traumatic listhesis. Straightening of the normal cervical lordosis. Trace stepwise anterolisthesis of C3 on C4 and C4 on C5. Skull base and vertebrae: No acute fracture. No primary bone lesion or focal pathologic process. Soft tissues and spinal canal: No prevertebral fluid or swelling. No visible canal hematoma. Disc levels: Degenerative changes in the cervical spine. No high-grade spinal canal stenosis. Upper chest: Negative. IMPRESSION: No acute fracture or traumatic listhesis in the cervical spine. Electronically Signed   By: Donald Campion M.D.   On: 03/14/2023 00:34   CT HEAD CODE STROKE WO CONTRAST Result Date: 03/14/2023 CLINICAL DATA:  Code stroke. Right arm weakness, right pupil blown, trauma to posterior head after fall EXAM: CT HEAD WITHOUT CONTRAST TECHNIQUE: Contiguous axial images were obtained from the base of the skull through the vertex without intravenous contrast. RADIATION DOSE REDUCTION: This exam was performed according to the departmental dose-optimization program which includes automated exposure control, adjustment of the mA and/or kV according to patient size and/or use of iterative reconstruction technique. COMPARISON:  02/21/2023 CT head FINDINGS: Brain: No evidence of acute infarction, hemorrhage, mass, mass effect, or midline  shift. No hydrocephalus or extra-axial collection. Basal ganglia calcifications. Normal cerebral volume. Vascular: No hyperdense vessel. Atherosclerotic calcifications in the intracranial carotid and vertebral arteries. Skull: Negative for fracture or focal lesion. Left occipital scalp  hematoma. Sinuses/Orbits: No acute finding. Other: The mastoid air cells are well aerated. ASPECTS [facility] Stroke Program Early CT Score) - Ganglionic level infarction (caudate, lentiform nuclei, internal capsule, insula, M1-M3 cortex): 7 - Supraganglionic infarction (M4-M6 cortex): 3 Total score (0-10 with 10 being normal): 10 IMPRESSION: 1. No acute intracranial process. ASPECTS is 10. 2. Left occipital scalp hematoma. Imaging results were communicated on 03/14/2023 at 12:21 am to provider Dr. Voncile via secure text paging. Electronically Signed   By: Donald Campion M.D.   On: 03/14/2023 00:21   CT Cervical Spine Wo Contrast Result Date: 02/21/2023 CLINICAL DATA:  83 year old male status post fall yesterday, legs gave out. Abrasions. Blurred vision. EXAM: CT CERVICAL SPINE WITHOUT CONTRAST TECHNIQUE: Multidetector CT imaging of the cervical spine was performed without intravenous contrast. Multiplanar CT image reconstructions were also generated. RADIATION DOSE REDUCTION: This exam was performed according to the departmental dose-optimization program which includes automated exposure control, adjustment of the mA and/or kV according to patient size and/or use of iterative reconstruction technique. COMPARISON:  CT head and face today.  Cervical spine CT 04/06/2022. FINDINGS: Alignment: Chronic straightening but mildly increased reversal of cervical lordosis now. Cervicothoracic junction alignment is within normal limits. Mild chronic degenerative appearing anterolisthesis at both C3-C4 and C4-C5 with chronic facet degeneration. Bilateral posterior element alignment is within normal limits. Skull base and vertebrae: Bone mineralization is within normal limits. Visualized skull base is intact. No atlanto-occipital dissociation. C1 and C2 appear intact and aligned. No acute osseous abnormality identified. Soft tissues and spinal canal: No prevertebral fluid or swelling. No visible canal hematoma. Negative visible  noncontrast neck soft tissues aside from carotid atherosclerosis. 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 Upper chest: Negative. IMPRESSION: 1. No acute traumatic injury identified in the cervical spine. 2. 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 Electronically Signed   By: VEAR Hurst M.D.   On: 02/21/2023 09:15   CT Maxillofacial Wo Contrast Result Date: 02/21/2023 CLINICAL DATA:  83 year old male status post fall yesterday, legs gave out. Abrasions. Blurred vision. EXAM: CT MAXILLOFACIAL WITHOUT CONTRAST TECHNIQUE: Multidetector CT imaging of the maxillofacial structures was performed. Multiplanar CT image reconstructions were also generated. RADIATION DOSE REDUCTION: This exam was performed according to the departmental dose-optimization program which includes automated exposure control, adjustment of the mA and/or kV according to patient size and/or use of iterative reconstruction technique. COMPARISON:  CT head and cervical spine today. Prior face CT 11/02/2022. FINDINGS: Osseous: Mandible intact and normally located. Stable dentition. Bilateral maxilla, zygoma, pterygoid and nasal bones appear stable and intact. Central skull base appears intact. Orbits: No acute orbital wall fracture. Chronic postoperative changes to the globes. No acute orbit soft tissue finding. Sinuses: Visualized paranasal sinuses and mastoids are stable and well aerated. Soft tissues: Negative visible noncontrast larynx, pharynx, parapharyngeal spaces, retropharyngeal space, sublingual space. Partial submandibular gland atrophy is stable. Masticator and parotid spaces appear negative. Calcified atherosclerosis. No soft tissue gas or superficial soft tissue injury identified other than the right forehead reported separately. Limited intracranial: Stable to  that reported separately. IMPRESSION: No acute traumatic injury identified in the Face. Electronically Signed   By: VEAR Hurst M.D.   On: 02/21/2023  09:13   CT Head Wo Contrast Result Date: 02/21/2023 CLINICAL DATA:  83 year old male status post fall yesterday, legs gave out. Abrasions. Blurred vision. EXAM: CT HEAD WITHOUT CONTRAST TECHNIQUE: Contiguous axial images were obtained from the base of the skull through the vertex without intravenous contrast. RADIATION DOSE REDUCTION: This exam was performed according to the departmental dose-optimization program which includes automated exposure control, adjustment of the mA and/or kV according to patient size and/or use of iterative reconstruction technique. COMPARISON:  Brain MRI 01/18/2023 and earlier. Face and cervical spine CT today reported separately. FINDINGS: Brain: Stable cerebral volume. Stable basal ganglia vascular calcifications. No midline shift, mass effect, No midline shift, ventriculomegaly, mass effect, evidence of mass lesion, intracranial hemorrhage or evidence of cortically based acute infarction. Stable gray-white matter differentiation throughout the brain. Mild for age white matter hypodensity. Vascular: Calcified atherosclerosis at the skull base. No suspicious intracranial vascular hyperdensity. Skull: Stable.  No fracture identified. Sinuses/Orbits: Visualized paranasal sinuses and mastoids are stable and well aerated. Other: Mild right forehead scalp hematoma or contusion series 3, image 18. Underlying right frontal bone intact. No scalp soft tissue gas. IMPRESSION: 1. Mild right forehead scalp hematoma or contusion. No skull fracture. 2. No acute intracranial abnormality. Stable mild for age chronic cerebral white matter changes. Electronically Signed   By: VEAR Hurst M.D.   On: 02/21/2023 09:09   PHYSICAL EXAM  Temp:  [97.4 F (36.3 C)-98.8 F (37.1 C)] 97.4 F (36.3 C) (01/05 1615) Pulse Rate:  [74-103] 74 (01/05 1615) Resp:   [18-19] 18 (01/05 1615) BP: (114-142)/(56-67) 142/67 (01/05 1615) SpO2:  [93 %-98 %] 93 % (01/05 1615)  General - Well nourished, well developed, in no apparent distress.  Cardiovascular - Regular rhythm and rate.  Neuro: Mental Status: Patient is awake, alert, oriented to person, place, month, year, and situation. Patient is able to give a clear and coherent history. No signs of aphasia or neglect Cranial Nerves: II: Visual Fields are full. Pupils are equal, round, and reactive to light.  Glasses at baseline III,IV, VI: EOMI without ptosis or diploplia.  V: Facial sensation is symmetric to light touch VII: Facial movement is symmetric.  VIII: hearing is intact to voice X: Uvula elevates symmetrically.  Phonation intact XI: Shoulder shrug is symmetric. XII: tongue is midline  Motor: Tone is normal. Bulk is normal.  BUE: No drift present, 5/5 RLE: very slight drift when compared to LLE LLE: no drift Sensory: Sensation is symmetric to light touch in the arms and legs. Cerebellar: FNF ataxic on RUE. HKS are intact bilaterally Gait: Deferred   ASSESSMENT/PLAN Mr. HOA DERISO is a 83 y.o. male with history of HTN, HLD, DM, prostate Ca, LBP admitted for fell at home, right sided weakness ataxia. TNK was given.    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 Left VA origin moderate stenosis MRI no acute infarct MRI C-spine cervical spinal cord edema/contusion at C3-5, concerning for contusion 2D Echo: LVEF 55 to 60% LDL 19 HgbA1c: 7.5 SCDs for VTE prophylaxis clopidogrel  75 mg daily prior to admission, now on ASA 81 with stable Hb. NSG consulted and recommend soft C-collar Ongoing aggressive stroke risk factor management Therapy recommendations:  CIR Disposition:  pending   Hx of hypertension Orthostatic  hypotension Stable on the low end Orthostatic vitals - supine 122/59,  sitting 85/49. standing 66/49 but asymptomaticd Encourage po intake Continue TED hose Continue midodrine  5mg  tid from 8am to 5pm Off IVF BP goal < 180/105 Long term BP goal normotensive  Acute anemia  Scalp laceration with prior bleeding Fall with scalp laceration -> bleeding after TNK -> now stopped Hb 11.1->8.0->8.1->8.7->8.2->8.1->8.8->8.2->8.2 CT C/A/P x 2 - No acute intrathoracic, intra-abdominal, intrapelvic traumatic injury. No pelvic sidewall or retroperitoneal hematoma. Asymmetry of the gluteus musculature inferiorly with asymmetric enlargement on the left - no hematoma on exam Continue CBC monitoring Put on ASA 81 today   Diabetes HgbA1c 7.5 goal < 7.0 Uncontrolled CBG monitoring SSI DM education and close PCP follow up  Hyperlipidemia Home meds:  crestor  40  LDL 19, goal < 70 Now decreased to Crestor  20 Continue statin at discharge  Other Stroke Risk Factors Advanced age  Other Active Problems LBP Prostate cancer Hypokalemia K 3.4->3.3- supplement  Hospital day # 7  Ary Cummins, MD PhD Stroke Neurology 03/21/2023 4:37 PM   To contact Stroke Continuity provider, please refer to [email or web address]. After hours, contact General Neurology

## 2023-03-21 NOTE — Plan of Care (Signed)
  Problem: Education: Goal: Knowledge of disease or condition will improve Outcome: Progressing Goal: Knowledge of secondary prevention will improve (MUST DOCUMENT ALL) Outcome: Progressing Goal: Knowledge of patient specific risk factors will improve Loraine Leriche N/A or DELETE if not current risk factor) Outcome: Progressing   Problem: Ischemic Stroke/TIA Tissue Perfusion: Goal: Complications of ischemic stroke/TIA will be minimized Outcome: Progressing   Problem: Coping: Goal: Will verbalize positive feelings about self Outcome: Progressing Goal: Will identify appropriate support needs Outcome: Progressing   Problem: Health Behavior/Discharge Planning: Goal: Ability to manage health-related needs will improve Outcome: Progressing Goal: Goals will be collaboratively established with patient/family Outcome: Progressing   Problem: Self-Care: Goal: Ability to participate in self-care as condition permits will improve Outcome: Progressing Goal: Verbalization of feelings and concerns over difficulty with self-care will improve Outcome: Progressing Goal: Ability to communicate needs accurately will improve Outcome: Progressing

## 2023-03-22 LAB — BASIC METABOLIC PANEL
Anion gap: 10 (ref 5–15)
BUN: 22 mg/dL (ref 8–23)
CO2: 25 mmol/L (ref 22–32)
Calcium: 8.3 mg/dL — ABNORMAL LOW (ref 8.9–10.3)
Chloride: 100 mmol/L (ref 98–111)
Creatinine, Ser: 1.12 mg/dL (ref 0.61–1.24)
GFR, Estimated: >60 mL/min (ref >60)
Glucose, Bld: 166 mg/dL — ABNORMAL HIGH (ref 70–99)
Potassium: 4 mmol/L (ref 3.5–5.1)
Sodium: 135 mmol/L (ref 135–145)

## 2023-03-22 LAB — GLUCOSE, CAPILLARY
Glucose-Capillary: 183 mg/dL — ABNORMAL HIGH (ref 70–99)
Glucose-Capillary: 197 mg/dL — ABNORMAL HIGH (ref 70–99)
Glucose-Capillary: 144 mg/dL — ABNORMAL HIGH (ref 70–99)
Glucose-Capillary: 187 mg/dL — ABNORMAL HIGH (ref 70–99)

## 2023-03-22 LAB — CBC
HCT: 24.2 % — ABNORMAL LOW (ref 39.0–52.0)
Hemoglobin: 8.1 g/dL — ABNORMAL LOW (ref 13.0–17.0)
MCH: 31 pg (ref 26.0–34.0)
MCHC: 33.5 g/dL (ref 30.0–36.0)
MCV: 92.7 fL (ref 80.0–100.0)
Platelets: 315 10*3/uL (ref 150–400)
RBC: 2.61 MIL/uL — ABNORMAL LOW (ref 4.22–5.81)
RDW: 13.1 % (ref 11.5–15.5)
WBC: 7.5 10*3/uL (ref 4.0–10.5)
nRBC: 0 % (ref 0.0–0.2)

## 2023-03-22 NOTE — TOC Progression Note (Signed)
 Transition of Care [facility]) - Progression Note    Patient Details  Name: Harry Zamora MRN: [id number] Date of Birth: 1940/04/22  [facility] CM/SW Contact  Andrez JULIANNA George, RN Phone Number: 03/22/2023, 1:51 PM  Clinical Narrative:     Continue to await insurance auth for CIR. TOC following.  Expected Discharge Plan: IP Rehab Facility Barriers to Discharge: Continued Medical Work up  Expected Discharge Plan and Services   Discharge Planning Services: CM Consult Post Acute Care Choice: IP Rehab Living arrangements for the past 2 months: Single Family Home                                       Social Determinants of Health (SDOH) Interventions SDOH Screenings   Food Insecurity: No Food Insecurity (03/15/2023)  Housing: Low Risk  (03/15/2023)  Transportation Needs: No Transportation Needs (03/15/2023)  Utilities: Not At Risk (03/15/2023)  Social Connections: Socially Integrated (03/15/2023)  Tobacco Use: High Risk (03/14/2023)    Readmission Risk Interventions     No data to display

## 2023-03-22 NOTE — Progress Notes (Signed)
 Inpatient Rehab Admissions Coordinator:   Awaiting determination from [facility] regarding prior auth request.   Estill Dooms, PT, DPT Admissions Coordinator [phone] 03/22/23  11:42 AM

## 2023-03-22 NOTE — Discharge Summary (Addendum)
 Stroke Discharge Summary  Patient ID: Harry Zamora   MRN: [id number]      DOB: 10-05-1940  Date of Admission: 03/14/2023 Date of Discharge: 03/23/2023  Attending Physician: Jerri Pfeiffer MD Consultant(s):    rehabilitation medicine and Neurosurgery   Patient's PCP:  Nichole Senior, MD  DISCHARGE PRIMARY DIAGNOSIS:  Stroke like symptoms s/p TNK Cervical spinal cord contusion s/p fall due to orthostatic hypotension    Secondary diagnosis Orthostatic hypotension Acute ischemia DM HLD Prostate cancer LBP Hypokalemia   Patient Active Problem List   Diagnosis Date Noted   Spinal cord contusion, cervical region 03/23/2023   Orthostatic hypotension 11/18/2022   Gait abnormality 11/18/2022   Peripheral neuropathy 11/18/2022   Confusion 11/18/2022   Unilateral primary osteoarthritis, left knee 10/01/2021   Postural dizziness with presyncope 08/04/2021   Lumbar spinal stenosis 04/24/2021   Sepsis (HCC) 03/13/2021   AKI (acute kidney injury) (HCC) 03/13/2021   Elevated CK 03/13/2021   Severe sepsis (HCC) 03/13/2021   PNA (pneumonia) 03/12/2021   Restless legs    Acute respiratory failure with hypoxia (HCC)    Coronary artery disease involving native coronary artery of native heart without angina pectoris 09/19/2020   Abnormal stress test 09/16/2020   Hyperkalemia 09/12/2020   Acute renal failure 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) 09/12/2020   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   Type 2 diabetes mellitus (HCC)    Syncope and collapse 08/07/2020   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   PAD (peripheral artery disease) (HCC) 08/30/2019   Spondylolisthesis of lumbar region 07/13/2019   Spinal stenosis of lumbar region with neurogenic claudication 10/02/2016   Diabetic polyneuropathy (HCC) 11/10/2013    Allergies as of 03/23/2023       Reactions   Atorvastatin Other (See Comments)   Niacin    Other Reaction(s): break out in hives    Levaquin [levofloxacin In D5w] Rash   Niaspan [niacin Er (antihyperlipidemic)] Rash, Other (See Comments)   Flushing reaction   Tramadol Rash        Medication List     STOP taking these medications    clopidogrel  75 MG tablet Commonly known as: PLAVIX    ferrous sulfate  325 (65 FE) MG EC tablet Replaced by: ferrous sulfate  325 (65 FE) MG tablet   meloxicam  15 MG tablet Commonly known as: MOBIC    prazosin  1 MG capsule Commonly known as: MINIPRESS    sildenafil 25 MG tablet Commonly known as: VIAGRA       TAKE these medications    ascorbic acid  500 MG tablet Commonly known as: VITAMIN C  Take 500 mg by mouth daily.   aspirin  EC 81 MG tablet Take 1 tablet (81 mg total) by mouth daily. Swallow whole. Start taking on: March 24, 2023   CALCIUM  1000 + D PO Take by mouth.   cyanocobalamin  1000 MCG tablet Take 1,000 mcg by mouth daily.   DULoxetine  30 MG capsule Commonly known as: CYMBALTA  Take 1 capsule (30 mg total) by mouth daily. Start taking on: March 24, 2023   ferrous sulfate  325 (65 FE) MG tablet Take 1 tablet (325 mg total) by mouth daily with breakfast. Start taking on: March 24, 2023 Replaces: ferrous sulfate  325 (65 FE) MG EC tablet   gabapentin  100 MG capsule Commonly known as: NEURONTIN  Take 100 mg by mouth at bedtime.   guaiFENesin  100 MG/5ML liquid Commonly known as: ROBITUSSIN Take 5 mLs by mouth every 4 (four) hours as needed  for cough or to loosen phlegm.   insulin  glargine-yfgn 100 UNIT/ML injection Commonly known as: SEMGLEE Inject 18 Units into the skin daily.   ipratropium-albuterol  0.5-2.5 (3) MG/3ML Soln Commonly known as: DUONEB Take 3 mLs by nebulization every 6 (six) hours as needed.   Krill Oil 500 MG Caps Take 1 capsule by mouth daily.   Lysine 500 MG Tabs Take 1 tablet by mouth daily.   Mens 50+ Multi Vitamin/Min Tabs Take 1 tablet by mouth daily.   metFORMIN  500 MG tablet Commonly known as: GLUCOPHAGE  Take 2  tablets (1,000 mg total) by mouth 2 (two) times daily with a meal. Resume 10/03/2020   midodrine  5 MG tablet Commonly known as: PROAMATINE  Take 1 tablet (5 mg total) by mouth 3 (three) times daily with meals.   nitroGLYCERIN  0.4 MG SL tablet Commonly known as: NITROSTAT  Place 1 tablet (0.4 mg total) under the tongue every 5 (five) minutes as needed for chest pain.   rOPINIRole  4 MG tablet Commonly known as: REQUIP  Take 4 mg by mouth at bedtime.   rosuvastatin  20 MG tablet Commonly known as: CRESTOR  Take 1 tablet (20 mg total) by mouth daily. Start taking on: March 24, 2023 What changed:  medication strength See the new instructions.   Tresiba FlexTouch 100 UNIT/ML FlexTouch Pen Generic drug: insulin  degludec daily. Inject on Wednesday   Trulicity  3 MG/0.5ML Soaj Generic drug: Dulaglutide  Inject 3 mg into the skin once a week. What changed: additional instructions        LABORATORY STUDIES CBC    Component Value Date/Time   WBC 6.5 03/23/2023 0523   RBC 2.68 (L) 03/23/2023 0523   HGB 8.2 (L) 03/23/2023 0523   HCT 24.5 (L) 03/23/2023 0523   PLT 368 03/23/2023 0523   MCV 91.4 03/23/2023 0523   MCH 30.6 03/23/2023 0523   MCHC 33.5 03/23/2023 0523   RDW 13.0 03/23/2023 0523   LYMPHSABS 1.9 03/14/2023 0010   MONOABS 1.4 (H) 03/14/2023 0010   EOSABS 0.1 03/14/2023 0010   BASOSABS 0.0 03/14/2023 0010   CMP    Component Value Date/Time   NA 136 03/23/2023 0523   NA 141 10/03/2020 1138   K 3.6 03/23/2023 0523   CL 101 03/23/2023 0523   CO2 25 03/23/2023 0523   GLUCOSE 181 (H) 03/23/2023 0523   BUN 16 03/23/2023 0523   BUN 27 10/03/2020 1138   CREATININE 1.01 03/23/2023 0523   CALCIUM  8.4 (L) 03/23/2023 0523   PROT 6.4 (L) 03/14/2023 0010   PROT 7.3 11/18/2022 1554   ALBUMIN  3.3 (L) 03/14/2023 0010   AST 35 03/14/2023 0010   ALT 20 03/14/2023 0010   ALKPHOS 64 03/14/2023 0010   BILITOT 1.4 (H) 03/14/2023 0010   GFRNONAA >60 03/23/2023 0523   GFRAA 56 (L)  09/05/2019 1144   COAGS Lab Results  Component Value Date   INR 1.2 03/14/2023   INR 1.0 09/13/2022   INR 1.1 03/12/2021   Lipid Panel    Component Value Date/Time   CHOL 61 03/14/2023 0405   CHOL 137 10/03/2020 1138   TRIG 47 03/14/2023 0405   HDL 33 (L) 03/14/2023 0405   HDL 33 (L) 10/03/2020 1138   CHOLHDL 1.8 03/14/2023 0405   VLDL 9 03/14/2023 0405   LDLCALC 19 03/14/2023 0405   LDLCALC 82 10/03/2020 1138   HgbA1C  Lab Results  Component Value Date   HGBA1C 7.5 (H) 03/15/2023   Alcohol Level    Component Value Date/Time  ETH <10 03/14/2023 0010     SIGNIFICANT DIAGNOSTIC STUDIES DG CHEST PORT 1 VIEW Result Date: 03/20/2023 CLINICAL DATA:  Leukocytosis. EXAM: PORTABLE CHEST 1 VIEW COMPARISON:  Radiographs 03/14/2023 and 11/01/2022. Chest CT 03/14/2023 and abdominal CT 03/15/2023. FINDINGS: 0633 hours. The heart size and mediastinal contours are stable with aortic atherosclerosis. There are lower lung volumes with increased patchy bibasilar airspace opacities compared with the 03/14/2023 radiographs. These are similar to the more recent abdominal CT of 03/15/2023 and suspicious for bronchopneumonia or aspiration. No pleural effusion or pneumothorax. The bones appear unchanged. IMPRESSION: Lower lung volumes with increased patchy bibasilar airspace opacities suspicious for bronchopneumonia or aspiration. Electronically Signed   By: Elsie Perone M.D.   On: 03/20/2023 10:27   MR CERVICAL SPINE WO CONTRAST Result Date: 03/18/2023 CLINICAL DATA:  Ataxia, cervical trauma. EXAM: MRI CERVICAL SPINE WITHOUT CONTRAST TECHNIQUE: Multiplanar, multisequence MR imaging of the cervical spine was performed. No intravenous contrast was administered. COMPARISON:  Cervical spine CT 03/14/2023 FINDINGS: Alignment: Mild reversal of the normal cervical lordosis. Trace anterolisthesis of C3 on C4 and C4 on C5, unchanged. Vertebrae: No acute fracture or suspicious marrow lesion. Cord: Abnormal  confluent T2 hyperintensity in the right aspect of the spinal cord extending from the superior C3 level to the C4-5 disc space level, greatest at C4. No evidence of cord hemorrhage on gradient echo imaging. Small amount of abnormal ventral STIR hyperintense signal behind the C3 vertebral body and C2-3 disc space measuring up to 2 mm in thickness and suspicious for a small epidural hematoma. Possible trace ventral and dorsal epidural hematoma at C4 and C5. Possible posterior longitudinal ligament disruption at C2-3. Posterior Fossa, vertebral arteries, paraspinal tissues: No significant prevertebral fluid or soft tissue swelling. Preserved vertebral artery flow voids. 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 C2-3: Mild uncovertebral spurring, asymmetrically severe left facet arthrosis with facet ankylosis, and suspected small volume epidural hematoma result in mild spinal stenosis and mild left neural foraminal stenosis. C3-4: Disc bulging, uncovertebral spurring, suspected small volume epidural hematoma, and moderate right and severe left facet arthrosis result in moderate spinal stenosis and mild right and severe left neural foraminal stenosis. C4-5: Disc bulging, uncovertebral spurring, and moderate to severe right and mild left facet arthrosis result in moderate spinal stenosis and mild-to-moderate right neural foraminal stenosis. C5-6: Disc bulging and uncovertebral spurring result in moderate spinal stenosis without significant neural foraminal stenosis. C6-7: Mild disc bulging without significant stenosis. C7-T1: Negative. These results will be called to the ordering clinician or representative by the Radiologist Assistant, and communication documented in the PACS or [employer]. IMPRESSION: 1. Abnormal cord signal at C3-4 and C4-5 compatible with edema/contusion. No evidence of cord hemorrhage. 2. Suspected small volume epidural hematoma in the upper cervical  spine with possible PLL disruption at C2-3. 3. 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 Electronically Signed   By: Dasie Hamburg M.D.   On: 03/18/2023 19:48   ECHOCARDIOGRAM COMPLETE Result Date: 03/15/2023    ECHOCARDIOGRAM REPORT   Patient Name:   Harry Zamora Date of Exam: 03/15/2023 Medical Rec #:  [id number]      Height:       74.0 in Accession #:    [id number]     Weight:       148.6 lb Date of Birth:  1940-08-26       BSA:  1.916 m Patient Age:    61 years       BP:           116/51 mmHg Patient Gender: M              HR:           90 bpm. Exam Location:  Inpatient Procedure: 2D Echo, 3D Echo, Cardiac Doppler, Color Doppler and Strain Analysis Indications:    Stroke  History:        Patient has no prior history of Echocardiogram examinations.                 Risk Factors:Hypertension, Diabetes, Dyslipidemia and Current                 Smoker.  Sonographer:    Ozell Free Referring Phys: [id number] ASHISH ARORA  Sonographer Comments: Global longitudinal strain was attempted. IMPRESSIONS  1. Left ventricular ejection fraction, by estimation, is 55 to 60%. The left ventricle has normal function. The left ventricle has no regional wall motion abnormalities. Left ventricular diastolic parameters were normal.  2. Right ventricular systolic function is normal. The right ventricular size is normal. There is moderately elevated pulmonary artery systolic pressure.  3. The mitral valve is normal in structure. No evidence of mitral valve regurgitation. No evidence of mitral stenosis.  4. The aortic valve is normal in structure. Aortic valve regurgitation is not visualized. No aortic stenosis is present.  5. The inferior vena cava is dilated in size with >50% respiratory variability, suggesting right atrial pressure of 8 mmHg. FINDINGS  Left Ventricle: Left ventricular ejection fraction, by estimation,  is 55 to 60%. The left ventricle has normal function. The left ventricle has no regional wall motion abnormalities. The left ventricular internal cavity size was normal in size. There is  no left ventricular hypertrophy. Left ventricular diastolic parameters were normal. Right Ventricle: The right ventricular size is normal. No increase in right ventricular wall thickness. Right ventricular systolic function is normal. There is moderately elevated pulmonary artery systolic pressure. The tricuspid regurgitant velocity is 2.94 m/s, and with an assumed right atrial pressure of 15 mmHg, the estimated right ventricular systolic pressure is 49.6 mmHg. Left Atrium: Left atrial size was normal in size. Right Atrium: Right atrial size was normal in size. Pericardium: There is no evidence of pericardial effusion. Mitral Valve: The mitral valve is normal in structure. No evidence of mitral valve regurgitation. No evidence of mitral valve stenosis. Tricuspid Valve: The tricuspid valve is normal in structure. Tricuspid valve regurgitation is trivial. No evidence of tricuspid stenosis. Aortic Valve: The aortic valve is normal in structure. Aortic valve regurgitation is not visualized. No aortic stenosis is present. Aortic valve mean gradient measures 5.0 mmHg. Aortic valve peak gradient measures 9.5 mmHg. Aortic valve area, by VTI measures 2.57 cm. Pulmonic Valve: The pulmonic valve was normal in structure. Pulmonic valve regurgitation is not visualized. No evidence of pulmonic stenosis. Aorta: The aortic root is normal in size and structure. Venous: The inferior vena cava is dilated in size with greater than 50% respiratory variability, suggesting right atrial pressure of 8 mmHg. IAS/Shunts: No atrial level shunt detected by color flow Doppler.  LEFT VENTRICLE PLAX 2D LVIDd:         4.40 cm   Diastology LVIDs:         2.80 cm   LV e' medial:    8.92 cm/s LV PW:         1.00  cm   LV E/e' medial:  8.4 LV IVS:        1.00 cm   LV  e' lateral:   9.57 cm/s LVOT diam:     2.20 cm   LV E/e' lateral: 7.8 LV SV:         68 LV SV Index:   36 LVOT Area:     3.80 cm                           3D Volume EF:                          3D EF:        53 %                          LV EDV:       150 ml                          LV ESV:       70 ml                          LV SV:        80 ml RIGHT VENTRICLE             IVC RV Basal diam:  4.40 cm     IVC diam: 2.30 cm RV S prime:     15.30 cm/s TAPSE (M-mode): 3.4 cm LEFT ATRIUM           Index        RIGHT ATRIUM           Index LA diam:      3.40 cm 1.77 cm/m   RA Area:     17.80 cm LA Vol (A2C): 59.8 ml 31.22 ml/m  RA Volume:   50.70 ml  26.47 ml/m LA Vol (A4C): 55.8 ml 29.13 ml/m  AORTIC VALVE AV Area (Vmax):    2.24 cm AV Area (Vmean):   2.24 cm AV Area (VTI):     2.57 cm AV Vmax:           154.00 cm/s AV Vmean:          106.000 cm/s AV VTI:            0.265 m AV Peak Grad:      9.5 mmHg AV Mean Grad:      5.0 mmHg LVOT Vmax:         90.70 cm/s LVOT Vmean:        62.400 cm/s LVOT VTI:          0.179 m LVOT/AV VTI ratio: 0.68  AORTA Ao Root diam: 3.60 cm Ao Asc diam:  3.00 cm MITRAL VALVE               TRICUSPID VALVE MV Area (PHT): 3.68 cm    TR Peak grad:   34.6 mmHg MV Decel Time: 206 msec    TR Vmax:        294.00 cm/s MV E velocity: 74.90 cm/s MV A velocity: 94.30 cm/s  SHUNTS MV E/A ratio:  0.79        Systemic VTI:  0.18 m  Systemic Diam: 2.20 cm Aditya Sabharwal Electronically signed by Ria Commander Signature Date/Time: 03/15/2023/10:55:31 AM    Final    CT ABDOMEN PELVIS WO CONTRAST Result Date: 03/15/2023 CLINICAL DATA:  Abdominal pain. Acute decrease in hemoglobin with tender abdomen. Evaluate for hematoma EXAM: CT ABDOMEN AND PELVIS WITHOUT CONTRAST TECHNIQUE: Multidetector CT imaging of the abdomen and pelvis was performed following the standard protocol without IV contrast. RADIATION DOSE REDUCTION: This exam was performed according to the  departmental dose-optimization program which includes automated exposure control, adjustment of the mA and/or kV according to patient size and/or use of iterative reconstruction technique. COMPARISON:  03/14/2023 FINDINGS: Lower chest: Dependent collapse/consolidation in both lower lobes is progressive in the interval with areas of peripheral airway impaction in the dependent lower lungs. Hepatobiliary: No suspicious focal abnormality in the liver on this study without intravenous contrast. Cholecystectomy. No intrahepatic or extrahepatic biliary dilation. Pancreas: No focal mass lesion. No dilatation of the main duct. No intraparenchymal cyst. Diffuse atrophy. Spleen: No splenomegaly. No suspicious focal mass lesion. Adrenals/Urinary Tract: No adrenal nodule or mass. Small nonobstructing stones are evident in both kidneys. High attenuation in the renal pelvis of each kidney in the ureters and the bladder lumen is compatible with excreted contrast from yesterday's CT scan. No hydroureteronephrosis. Bladder lumen opacified but otherwise unremarkable. Stomach/Bowel: Stomach is unremarkable. No gastric wall thickening. No evidence of outlet obstruction. Duodenum is normally positioned as is the ligament of Treitz. No small bowel wall thickening. No small bowel dilatation. The terminal ileum is normal. The appendix is normal. The tip of the appendix appears to be filled with soft tissue (51/3) without distension. This finding is stable comparing back to a CT from 03/24/2021 but appears minimally progressive when comparing to 09/12/2020. No gross colonic mass. No colonic wall thickening. Moderate to large stool volume evident. Vascular/Lymphatic: There is moderate atherosclerotic calcification of the abdominal aorta without aneurysm. There is no gastrohepatic or hepatoduodenal ligament lymphadenopathy. No retroperitoneal or mesenteric lymphadenopathy. No pelvic sidewall lymphadenopathy. 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 Other: No intraperitoneal free fluid. Musculoskeletal: No pelvic sidewall or retroperitoneal hematoma. There is some minimal thickening of the right posterior renal fascia, stable to minimally progressive since 03/14/2023, but this is only minimal and is nonspecific, not representing a discrete hematoma or substantial hemorrhage. Lumbar fusion hardware evident. The patient is noted to have asymmetry of the gluteus musculature inferiorly with asymmetric enlargement on the left although this is stable to minimally increased compared to 03/14/2023 and has been incompletely visualized. IMPRESSION: 1. No pelvic sidewall or retroperitoneal hematoma. There is some minimal thickening of the right posterior renal fascia, stable to minimally progressive since 03/14/2023, but this is only minimal and is nonspecific, not representing a discrete hematoma or substantial hemorrhage. 2. Asymmetry of the gluteus musculature inferiorly with asymmetric enlargement on the left although this is stable to may be minimally increased compared to 03/14/2023 and has been incompletely visualized. There is not a discrete hematoma a discernible on this study but a component of intramuscular hemorrhage could have this appearance. This would likely be amenable to clinical inspection. 3. Dependent collapse/consolidation in both lower lobes is progressive in the interval with areas of peripheral airway impaction in the dependent lower lungs. Imaging features compatible may be infectious although aspiration could have this appearance. 4. The tip of the appendix appears to be filled with soft tissue without distension. This finding is stable comparing back to a CT from 03/24/2021 but appears minimally progressive  when comparing to 09/12/2020. This is a nonspecific finding and may be related to mucocele. Follow-up CT in 6 months recommended. Non emergent follow-up surgical consultation recommended. 5. Small nonobstructing  stones in both kidneys. 6.  Aortic Atherosclerosis (ICD10-I70.0). Electronically Signed   By: Camellia Candle M.D.   On: 03/15/2023 06:24   MR BRAIN WO CONTRAST Result Date: 03/15/2023 CLINICAL DATA:  Stroke/TIA.  Status post T NK. EXAM: MRI HEAD WITHOUT CONTRAST TECHNIQUE: Multiplanar, multiecho pulse sequences of the brain and surrounding structures were obtained without intravenous contrast. COMPARISON:  01/18/2023 FINDINGS: Brain: No acute infarct, mass effect or extra-axial collection. 3-5 chronic microhemorrhages in the cerebellum. There is multifocal hyperintense T2-weighted signal within the white matter. Parenchymal volume and CSF spaces are normal. The midline structures are normal. Vascular: Normal flow voids. Skull and upper cervical spine: Posterior scalp subgaleal collection. Sinuses/Orbits:No paranasal sinus fluid levels or advanced mucosal thickening. No mastoid or middle ear effusion. Normal orbits. IMPRESSION: 1. No acute intracranial abnormality. 2. Findings of chronic small vessel ischemia. 3. Posterior scalp subgaleal hematoma. Electronically Signed   By: Franky Stanford M.D.   On: 03/15/2023 00:01   CT HEAD WO CONTRAST ( ) Result Date: 03/14/2023 CLINICAL DATA:  Stroke, follow-up. Persistent right upper extremity ataxia and right lower extremity dysmetria. Status post TNK. EXAM: CT HEAD WITHOUT CONTRAST TECHNIQUE: Contiguous axial images were obtained from the base of the skull through the vertex without intravenous contrast. RADIATION DOSE REDUCTION: This exam was performed according to the departmental dose-optimization program which includes automated exposure control, adjustment of the mA and/or kV according to patient size and/or use of iterative reconstruction technique. COMPARISON:  CT head and CT angio head and neck 03/14/2023 at 12:15 a.m. FINDINGS: Brain: Mild atrophy and white matter disease is stable. No acute infarct or hemorrhage is present. Deep brain nuclei are within  normal limits. The ventricles are proportionate to the degree of atrophy. No significant extraaxial fluid collection is present. The brainstem and cerebellum are within normal limits. Midline structures are within normal limits. Vascular: Atherosclerotic calcifications are again noted within the cavernous internal carotid arteries bilaterally and at the dural margin of the right vertebral artery. No hyperdense vessel is present. Skull: A left parietal and occipital scalp hematoma is new. No acute fracture is present. Sinuses/Orbits: The paranasal sinuses and mastoid air cells are clear. Bilateral lens replacements are noted. Globes and orbits are otherwise unremarkable. IMPRESSION: 1. No acute intracranial abnormality or significant interval change. 2. Stable atrophy and white matter disease. This likely reflects the sequela of chronic microvascular ischemia. 3. New left parietal and occipital scalp hematoma without underlying fracture. Electronically Signed   By: Lonni Necessary M.D.   On: 03/14/2023 17:54   CT CHEST ABDOMEN PELVIS W CONTRAST Result Date: 03/14/2023 CLINICAL DATA:  Chest trauma, blunt Abdominal trauma, blunt. Stroke, fall. EXAM: CT CHEST, ABDOMEN, AND PELVIS WITH CONTRAST TECHNIQUE: Multidetector CT imaging of the chest, abdomen and pelvis was performed following the standard protocol during bolus administration of intravenous contrast. RADIATION DOSE REDUCTION: This exam was performed according to the departmental dose-optimization program which includes automated exposure control, adjustment of the mA and/or kV according to patient size and/or use of iterative reconstruction technique. CONTRAST:  75mL OMNIPAQUE  IOHEXOL  350 MG/ML SOLN COMPARISON:  CT angio chest 11/18/2022, CT angio chest 03/18/2021 FINDINGS: CHEST: Cardiovascular: No aortic injury. The thoracic aorta is normal in caliber. The heart is normal in size. No significant pericardial effusion. Moderate atherosclerotic plaque.  At least 2 vessel  coronary artery calcification. The main pulmonary artery is normal in caliber. No central or segmental pulmonary embolus. Limited evaluation more distally due to timing of contrast and motion artifact. Mediastinum/Nodes: No pneumomediastinum. No mediastinal hematoma. The esophagus is unremarkable. The thyroid  is unremarkable. The central airways are patent. No mediastinal, hilar, or axillary lymphadenopathy. Lungs/Pleura: Bilateral lower lobe bronchial wall thickening. Bilateral lower lobe mucous plugging, right greater than left. Bibasilar atelectasis. Left lower lobe ground-glass airspace opacity. Stable left lower lobe calcified pulmonary micronodule-no further follow-up indicated. No pulmonary mass. No pulmonary contusion or laceration. No pneumatocele formation. No pleural effusion. No pneumothorax. No hemothorax. Musculoskeletal/Chest wall: No chest wall mass. No acute rib or sternal fracture. No spinal fracture. ABDOMEN / PELVIS: Hepatobiliary: Not enlarged. No focal lesion. No laceration or subcapsular hematoma. Status post cholecystectomy.  No biliary ductal dilatation. Pancreas: Normal pancreatic contour. No main pancreatic duct dilatation. Spleen: Not enlarged. No focal lesion. No laceration, subcapsular hematoma, or vascular injury. Adrenals/Urinary Tract: No nodularity bilaterally. Bilateral kidneys enhance and excrete symmetrically. No hydronephrosis. No contusion, laceration, or subcapsular hematoma. No injury to the vascular structures or collecting systems. No hydroureter. The urinary bladder is unremarkable. Stomach/Bowel: No small or large bowel wall thickening or dilatation. Stool throughout the colon. The appendix is unremarkable. Vasculature/Lymphatics: Severe atherosclerotic plaque no abdominal aorta or iliac aneurysm. No active contrast extravasation or pseudoaneurysm. No abdominal, pelvic, inguinal lymphadenopathy. Reproductive: Radiation seeds along the prostate. Other: No  simple free fluid ascites. No pneumoperitoneum. No hemoperitoneum. No mesenteric hematoma identified. No organized fluid collection. Musculoskeletal: No significant soft tissue hematoma. No acute pelvic fracture. No spinal fracture. L2-S2 posterolateral interbody surgical hardware fusion. Ports and Devices: None. IMPRESSION: 1. No acute intrathoracic, intra-abdominal, intrapelvic traumatic injury. 2. No acute fracture or traumatic malalignment of the thoracic or lumbar spine. 3. Other imaging findings of potential clinical significance: Bilateral lower lobe bronchial wall thickening with bilateral mucous plugging and patchy ground-glass airspace opacity left lower lobe-question developing infection/inflammation. Stool throughout the colon-correlate for constipation. Aortic Atherosclerosis (ICD10-I70.0)-severe. Electronically Signed   By: Morgane  Naveau M.D.   On: 03/14/2023 03:34   DG Chest Port 1 View Result Date: 03/14/2023 CLINICAL DATA:  Recent fall EXAM: PORTABLE CHEST 1 VIEW COMPARISON:  11/01/2022 FINDINGS: Cardiac shadows within normal limits. Aortic calcifications are noted. Skin fold is noted over the right chest. Minimal left basilar atelectasis is seen. No focal confluent infiltrate is noted. No bony abnormality is noted. IMPRESSION: Mild left basilar atelectasis. Electronically Signed   By: Oneil Devonshire M.D.   On: 03/14/2023 00:58   CT ANGIO HEAD NECK W WO CM (CODE STROKE) Result Date: 03/14/2023 CLINICAL DATA:  Right arm weakness, right pupil blown, head trauma EXAM: CT ANGIOGRAPHY HEAD AND NECK WITH AND WITHOUT CONTRAST TECHNIQUE: Multidetector CT imaging of the head and neck was performed using the standard protocol during bolus administration of intravenous contrast. Multiplanar CT image reconstructions and MIPs were obtained to evaluate the vascular anatomy. Carotid stenosis measurements (when applicable) are obtained utilizing NASCET criteria, using the distal internal carotid diameter as  the denominator. RADIATION DOSE REDUCTION: This exam was performed according to the departmental dose-optimization program which includes automated exposure control, adjustment of the mA and/or kV according to patient size and/or use of iterative reconstruction technique. CONTRAST:  75mL OMNIPAQUE  IOHEXOL  350 MG/ML SOLN COMPARISON:  09/13/2022 CTA head and neck, correlation is made with 03/14/2023 CT head FINDINGS: CT HEAD FINDINGS For noncontrast findings, please see same day CT head. CTA NECK FINDINGS Aortic arch:  Standard branching. Imaged portion shows no evidence of aneurysm or dissection. No significant stenosis of the major arch vessel origins. Aortic atherosclerosis Right carotid system: No evidence of dissection, occlusion, or hemodynamically significant stenosis (greater than 50%). Left carotid system: No evidence of dissection, occlusion, or hemodynamically significant stenosis (greater than 50%). Vertebral arteries: Moderate stenosis at the origin of the left vertebral artery. Evaluation of the proximal left vertebral artery is limited by beam hardening artifact from the adjacent contrast bolus. The left vertebral artery is otherwise patent to the skull base. Mild stenosis at the origin of the right vertebral artery. The right vertebral artery is otherwise patent to the skull base. No evidence of dissection. Skeleton: No acute osseous abnormality. Degenerative changes in the cervical spine. Other neck: No acute finding. Upper chest: No focal pulmonary opacity or pleural effusion. Review of the MIP images confirms the above findings CTA HEAD FINDINGS Anterior circulation: Both internal carotid arteries are patent to the termini, with mild stenosis in the bilateral cavernous segments and moderate to severe stenosis in the proximal supraclinoid segments bilaterally. A1 segments patent. Normal anterior communicating artery. Anterior cerebral arteries are patent to their distal aspects without significant  stenosis. No M1 stenosis or occlusion. MCA branches are irregular but perfused to their distal aspects without significant stenosis. Posterior circulation: Vertebral arteries patent to the vertebrobasilar junction, with mild stenosis in the proximal right V4. The left vertebral artery primarily supplies the left PICA, and is quite diminutive after the PICA takeoff. Posterior inferior cerebellar arteries patent proximally. Basilar patent to its distal aspect without significant stenosis. Superior cerebellar arteries patent proximally. 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 Redemonstrated severe stenosis in the proximal left P2 (series 7, images 98-99), with multifocal irregularity in the remainder of the left P2. Evaluation of the more distal PCAs is limited by venous contamination. The bilateral posterior communicating arteries are not definitively seen. Venous sinuses: As permitted by contrast timing, patent. Anatomic variants: None significant. No evidence of aneurysm or vascular malformation. Review of the MIP images confirms the above findings IMPRESSION: 1. No intracranial large vessel occlusion. 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 2. 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 3. 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 4. Moderate stenosis at the origin of the left vertebral artery and mild stenosis at the origin of the right vertebral artery. 5. Aortic atherosclerosis. Aortic Atherosclerosis (ICD10-I70.0). Electronically Signed   By: Donald Campion M.D.   On: 03/14/2023 00:46   CT Cervical Spine Wo Contrast Result Date: 03/14/2023 CLINICAL DATA:   Neck trauma, fell backwards EXAM: CT CERVICAL SPINE WITHOUT CONTRAST TECHNIQUE: Multidetector CT imaging of the cervical spine was performed without intravenous contrast. Multiplanar CT image reconstructions were also generated. RADIATION DOSE REDUCTION: This exam was performed according to the departmental dose-optimization program which includes automated exposure control, adjustment of the mA and/or kV according to patient size and/or use of iterative reconstruction technique. COMPARISON:  02/21/2023 CT cervical spine FINDINGS: Alignment: No traumatic listhesis. Straightening of the normal cervical lordosis. Trace stepwise anterolisthesis of C3 on C4 and C4 on C5. Skull base and vertebrae: No acute fracture. No primary bone lesion or focal pathologic process. Soft  tissues and spinal canal: No prevertebral fluid or swelling. No visible canal hematoma. Disc levels: Degenerative changes in the cervical spine. No high-grade spinal canal stenosis. Upper chest: Negative. IMPRESSION: No acute fracture or traumatic listhesis in the cervical spine. Electronically Signed   By: Donald Campion M.D.   On: 03/14/2023 00:34   CT HEAD CODE STROKE WO CONTRAST Result Date: 03/14/2023 CLINICAL DATA:  Code stroke. Right arm weakness, right pupil blown, trauma to posterior head after fall EXAM: CT HEAD WITHOUT CONTRAST TECHNIQUE: Contiguous axial images were obtained from the base of the skull through the vertex without intravenous contrast. RADIATION DOSE REDUCTION: This exam was performed according to the departmental dose-optimization program which includes automated exposure control, adjustment of the mA and/or kV according to patient size and/or use of iterative reconstruction technique. COMPARISON:  02/21/2023 CT head FINDINGS: Brain: No evidence of acute infarction, hemorrhage, mass, mass effect, or midline shift. No hydrocephalus or extra-axial collection. Basal ganglia calcifications. Normal cerebral volume. Vascular: No  hyperdense vessel. Atherosclerotic calcifications in the intracranial carotid and vertebral arteries. Skull: Negative for fracture or focal lesion. Left occipital scalp hematoma. Sinuses/Orbits: No acute finding. Other: The mastoid air cells are well aerated. ASPECTS [facility] Stroke Program Early CT Score) - Ganglionic level infarction (caudate, lentiform nuclei, internal capsule, insula, M1-M3 cortex): 7 - Supraganglionic infarction (M4-M6 cortex): 3 Total score (0-10 with 10 being normal): 10 IMPRESSION: 1. No acute intracranial process. ASPECTS is 10. 2. Left occipital scalp hematoma. Imaging results were communicated on 03/14/2023 at 12:21 am to provider Dr. Voncile via secure text paging. Electronically Signed   By: Donald Campion M.D.   On: 03/14/2023 00:21       HISTORY OF PRESENT ILLNESS 83 y.o. patient with history of Anemia, Arthritis, Back pain, Depression, Disc displacement, lumbar, Gynecomastia, History of kidney stones, Hyperlipidemia, Hypertension, Hypertriglyceridemia, Insomnia, Low back pain, Lumbar radiculopathy, Lumbar stenosis, OA (osteoarthritis), Polyneuropathy in diabetes(357.2), Prostate cancer (HCC) (2003), Restless legs, Ringing in ears, RLS (restless legs syndrome), and Type 2 diabetes mellitus (HCC).  Last known well around 11 PM when he went to the bathroom and on returning from the bathroom had a fall hit the back of his head had a small laceration in the back of the head and EMS was called with this. Patient reports that he fell right before EMS was called and a few minutes before that was completely normal so going bilateral level history, last known well is around 11 as the fall happened around 11:15 PM. Risk benefits alternatives of TNK discussed with the patient. Patient is being followed by [facility] neurology for orthostatic hypotension and frequent falls but there has been no mention of any focal weakness in the history.  Also, no right-sided weakness or pupillary asymmetry was  listed in the last exam of 01/15/2023.  MoCA score 20 during that same evaluation.  HOSPITAL COURSE Stroke like symptoms s/p TNK Cervical spinal cord contusion s/p fall due to orthostatic hypotension  CT no acute finding CTA head and neck b/l P1/P2 severe stenosis. 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 Left VA origin moderate stenosis MRI no acute infarct MRI C-spine cervical spinal cord edema/contusion at C3-5, concerning for contusion 2D Echo: LVEF 55 to 60% LDL 19 HgbA1c: 7.5 SCDs for VTE prophylaxis clopidogrel  75 mg daily prior to admission, now on ASA 81 with stable Hb. NSG consulted and recommend soft C-collar, also needs outpt follow up Ongoing aggressive stroke risk factor management Therapy recommendations:  CIR Disposition:  pending  Hx of hypertension Orthostatic hypotension Stable on the low end Orthostatic vitals - supine 122/59, sitting 85/49. standing 66/49 but asymptomaticd Encourage po intake Continue TED hose Continue midodrine  5mg  tid from 8am to 5pm Off IVF BP goal < 180/105 Long term BP goal normotensive   Acute anemia  Scalp laceration with prior bleeding Fall with scalp laceration -> bleeding after TNK -> now stopped Hb 11.1->8.0->8.1->8.7->8.2->8.1->8.8->8.2->8.2 CT C/A/P x 2 - No acute intrathoracic, intra-abdominal, intrapelvic traumatic injury. No pelvic sidewall or retroperitoneal hematoma. Asymmetry of the gluteus musculature inferiorly with asymmetric enlargement on the left - no hematoma on exam Continue CBC monitoring Put on ASA 81 today    Diabetes HgbA1c 7.5 goal < 7.0 Uncontrolled CBG monitoring SSI DM education and close PCP follow up   Hyperlipidemia Home meds:  crestor  40  LDL 19, goal < 70 Now decreased to Crestor  20 Continue statin at discharge   AKI Cre 1.88--1.62--1.50--1.39--1.27--1.12--1.01 Encourage po intake BMP monitoring  Other Stroke Risk Factors Advanced age   Other Active Problems LBP Prostate  cancer Hypokalemia K 3.4->3.3- supplement->4.0   DISCHARGE EXAM  PHYSICAL EXAM General:  Alert, well-nourished, well-developed patient in no acute distress Psych:  Mood and affect appropriate for situation CV: Regular rate and rhythm on monitor Respiratory:  Regular, unlabored respirations on room air GI: Abdomen soft and nontender  Mental Status: Patient is awake, alert, oriented to person, place, month, year, and situation. Patient is able to give a clear and coherent history. No signs of aphasia or neglect Cranial Nerves: II: Visual Fields are full. Pupils are equal, round, and reactive to light.  Glasses at baseline III,IV, VI: EOMI without ptosis or diploplia.  V: Facial sensation is symmetric to light touch VII: Facial movement is symmetric.  VIII: hearing is intact to voice X: Uvula elevates symmetrically.  Phonation intact XI: Shoulder shrug is symmetric. XII: tongue is midline  Motor: Tone is normal. Bulk is normal.  BUE: No drift present, 5/5 RLE: No drift today on exam LLE: no drift Sensory: Sensation is symmetric to light touch in the arms and legs. Cerebellar: FNF intact RUE. HKS are intact bilaterally Gait: Deferred  1a Level of Conscious.: 0 1b LOC Questions: 0 1c LOC Commands: 0 2 Best Gaze: 0 3 Visual: 0 4 Facial Palsy: 0 5a Motor Arm - left: 0 5b Motor Arm - Right: 0 6a Motor Leg - Left: 0 6b Motor Leg - Right: 0 7 Limb Ataxia: 0 8 Sensory: 0 9 Best Language: 0 10 Dysarthria: 0 11 Extinct. and Inatten.: 0 TOTAL: 0   Discharge Diet       Diet   Diet heart healthy/carb modified Room service appropriate? Yes with Assist; Fluid consistency: Thin   liquids  DISCHARGE PLAN Disposition: Rehab aspirin  81 mg daily for secondary stroke prevention  Ongoing stroke risk factor control by Primary Care Physician at time of discharge Follow-up PCP Nichole Senior, MD in 2 weeks. Follow-up in [facility] in 4-6 weeks,  office to schedule an appointment. Able to see NP in clinic. Also needs outpt follow up with neurosurgery  35 minutes were spent preparing discharge.  Patient seen and examined by NP/APP with MD. MD to update note as needed.   Jorene Last, DNP, FNP-BC [facility] Pager: [phone]  ATTENDING NOTE: I reviewed above note and agree with the assessment and plan. Pt was seen and examined.   No family at the bedside. Pt lying in bed, neuro unchanged. Cre normalized and  Hb stable. Medically ready for CIR placement. He will need to follow up with neurology and neurosurgery as outpt.   For detailed assessment and plan, please refer to above/below as I have made changes wherever appropriate.   Ary Cummins, MD PhD Stroke Neurology 03/23/2023 3:42 PM

## 2023-03-22 NOTE — H&P (Signed)
 Physical Medicine and Rehabilitation Admission H&P    Chief Complaint  Patient presents with   Code Stroke  : HPI: Harry Zamora is a 83 year old right-handed male with history of chronic anemia, chronic back pain status post anterior lateral lumbar fusion with radiculopathy, depression/insomnia, chronic orthostatic hypotension followed by [facility] neurology with frequent falls, hyperlipidemia, prostate cancer, CAD with stenting maintained on Plavix , type 2 diabetes mellitus with neuropathy, restless legs, patient does smoke cigars.  Per chart review patient lives with spouse.  1 level home.  Independent and driving prior to admission.  Presented 03/14/2023 after a fall while returning from the bathroom when he struck his head had a small laceration on the back of his head.  Denied loss of consciousness.  Initial cranial CT scan showed no acute intracranial process.  Left occipital scalp hematoma.  CT cervical spine negative however MRI cervical spine showed cord edema/contusion at C3-5 and was placed in a cervical soft collar per neurosurgery..  CTA of head and neck showed no intracranial large vessel occlusion.  Demonstrated severe stenosis of the right PCA near the P1-P2 junction with the remainder of the right PCA being diminutive and poorly opacified.  Severe stenosis in the proximal left P2 with multifocal irregularity in the remainder of the left P2.  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  Moderate stenosis of the origin of the left vertebral artery and mild stenosis of the origin of the right vertebral artery.  CT of the chest abdomen and pelvis showed no acute intrathoracic intra-abdominal or intrapelvic traumatic injury.  Patient did receive TNK.  MRI of the brain follow-up showed no acute intracranial abnormalities finding of small vessel ischemia as well as noted posterior scalp subgaleal hematoma.  Admission chemistries were unremarkable  except creatinine of 1.34, WBC 13,700, lactic acid 1.7, alcohol negative.  Patient was cleared for low-dose aspirin  81 mg but chronic Plavix  has been discontinued.  Hospital course monitoring of blood pressure maintained on ProAmatine .  Tolerating a regular consistency diet.  Therapy evaluations completed due to patient decreased functional mobility was admitted for a comprehensive rehab program.   Pt reports no more bowel incontinence that was having initially-  Now continent and can feel/control BM's- LBM 2 days ago Also reports voiding well- with control and no accidents.  Feeling stronger.  No cold or flu Sx's anymore- feels good resp wise  Has some back pain- interimittent, but no back pain right now.    Review of Systems  Constitutional:  Negative for chills and fever.  HENT:  Positive for tinnitus.   Eyes:  Negative for blurred vision and double vision.  Respiratory:  Negative for cough, hemoptysis, sputum production, shortness of breath and wheezing.   Cardiovascular:  Negative for chest pain, palpitations and leg swelling.  Gastrointestinal:  Positive for constipation. Negative for heartburn, nausea and vomiting.  Genitourinary:  Positive for urgency. Negative for dysuria, flank pain and hematuria.  Musculoskeletal:  Positive for back pain and falls.  Skin:  Negative for rash.  Neurological:  Positive for sensory change and weakness.       Restless leg syndrome  Psychiatric/Behavioral:  Positive for depression. The patient has insomnia.   All other systems reviewed and are negative.  Past Medical History:  Diagnosis Date   Anemia    as an infant   Arthritis    Back pain    Depression    Disc displacement, lumbar    Gynecomastia  History of kidney stones    Hyperlipidemia    Hypertension    no longer on medications   Hypertriglyceridemia    Insomnia    Low back pain    Lumbar radiculopathy    Lumbar stenosis    OA (osteoarthritis)    Polyneuropathy in  diabetes(357.2)    Prostate cancer (HCC) 2003   Restless legs    Ringing in ears    RLS (restless legs syndrome)    Type 2 diabetes mellitus (HCC)    Past Surgical History:  Procedure Laterality Date   ABDOMINAL EXPOSURE N/A 07/13/2019   Procedure: ABDOMINAL EXPOSURE;  Surgeon: Serene Gaile ORN, MD;  Location: [facility] OR;  Service: Vascular;  Laterality: N/A;  anterior approach   ANTERIOR LAT LUMBAR FUSION Right 07/13/2019   Procedure: Right Lumbar 3-4 Lumbar 4-5 Anterolateral lumbar interbody fusion;  Surgeon: Unice Pac, MD;  Location: [facility] OR;  Service: Neurosurgery;  Laterality: Right;   ANTERIOR LUMBAR FUSION N/A 07/13/2019   Procedure: Lumbar Five-Sacral One Anterior lumbar interbody fusion;  Surgeon: Unice Pac, MD;  Location: [facility] OR;  Service: Neurosurgery;  Laterality: N/A;  Anterior approach   APPLICATION OF INTRAOPERATIVE CT SCAN N/A 04/24/2021   Procedure: APPLICATION OF INTRAOPERATIVE CT SCAN;  Surgeon: Dawley, Lani BROCKS, DO;  Location: [facility] OR;  Service: Neurosurgery;  Laterality: N/A;  3C/RM 21   CHOLECYSTECTOMY     COLONOSCOPY     CORONARY STENT INTERVENTION N/A 10/01/2020   Procedure: CORONARY STENT INTERVENTION;  Surgeon: Elmira Newman PARAS, MD;  Location: [facility] INVASIVE CV LAB;  Service: Cardiovascular;  Laterality: N/A;   EYE SURGERY Bilateral    GALLBLADDER SURGERY     HEMORRHOID SURGERY  1983   LEFT HEART CATH AND CORONARY ANGIOGRAPHY N/A 09/17/2020   Procedure: LEFT HEART CATH AND CORONARY ANGIOGRAPHY;  Surgeon: Elmira Newman PARAS, MD;  Location: [facility] INVASIVE CV LAB;  Service: Cardiovascular;  Laterality: N/A;   LUMBAR LAMINECTOMY/DECOMPRESSION MICRODISCECTOMY N/A 10/02/2016   Procedure: L3 to S1 Laminectomy;  Surgeon: Unice Pac, MD;  Location: [facility] OR;  Service: Neurosurgery;  Laterality: N/A;  L3 to S1 Laminectomy   LUMBAR LAMINECTOMY/DECOMPRESSION MICRODISCECTOMY Bilateral 09/07/2019   Procedure: Bilateral Lumbar Five - Sacral One Foraminotomy;  Surgeon: Unice Pac, MD;   Location: [facility] OR;  Service: Neurosurgery;  Laterality: Bilateral;  posterior   LUMBAR PERCUTANEOUS PEDICLE SCREW 3 LEVEL N/A 07/13/2019   Procedure: Percutaneous pedicle screw fixation from Lumbar 3 to Sacral 1;  Surgeon: Unice Pac, MD;  Location: [facility] OR;  Service: Neurosurgery;  Laterality: N/A;   MENISCUS REPAIR Right    PROSTATE SURGERY     SHOULDER SURGERY Left 01/2011   rotator cuff   Family History  Problem Relation Age of Onset   Arthritis Mother    Hypertension Mother    Diabetes Mother    Heart attack Mother    Cancer Father    Social History:  reports that he has been smoking cigars. He has never used smokeless tobacco. He reports that he does not currently use alcohol. He reports that he does not use drugs. Allergies:  Allergies  Allergen Reactions   Atorvastatin Other (See Comments)   Niacin     Other Reaction(s): break out in hives   Levaquin [Levofloxacin In D5w] Rash   Niaspan [Niacin Er (Antihyperlipidemic)] Rash and Other (See Comments)    Flushing reaction   Tramadol Rash   Medications Prior to Admission  Medication Sig Dispense Refill   ascorbic acid  (VITAMIN C ) 500 MG tablet  Take 500 mg by mouth daily.     Calcium  Carb-Cholecalciferol  (CALCIUM  1000 + D PO) Take by mouth.     clopidogrel  (PLAVIX ) 75 MG tablet Take 1 tablet (75 mg total) by mouth daily. **Restart Plavix  on 04/29/2021** 90 tablet 3   cyanocobalamin  1000 MCG tablet Take 1,000 mcg by mouth daily.     Dulaglutide  (TRULICITY ) 3 MG/0.5ML SOAJ Inject 3 mg into the skin once a week. (Patient taking differently: Inject 3 mg into the skin once a week. Inject on Wednesday) 12 mL 3   DULoxetine  (CYMBALTA ) 30 MG capsule Take 30 mg by mouth daily.     ferrous sulfate  325 (65 FE) MG EC tablet Take 325 mg by mouth daily with breakfast.     gabapentin  (NEURONTIN ) 100 MG capsule Take 100 mg by mouth at bedtime.     insulin  glargine-yfgn (SEMGLEE) 100 UNIT/ML injection Inject 18 Units into the skin daily.      Krill Oil 500 MG CAPS Take 1 capsule by mouth daily.     Lysine 500 MG TABS Take 1 tablet by mouth daily.     meloxicam  (MOBIC ) 15 MG tablet Take 15 mg by mouth daily.     metFORMIN  (GLUCOPHAGE ) 500 MG tablet Take 2 tablets (1,000 mg total) by mouth 2 (two) times daily with a meal. Resume 10/03/2020  1   Multiple Vitamins-Minerals (MENS 50+ MULTI VITAMIN/MIN) TABS Take 1 tablet by mouth daily.     nitroGLYCERIN  (NITROSTAT ) 0.4 MG SL tablet Place 1 tablet (0.4 mg total) under the tongue every 5 (five) minutes as needed for chest pain. 30 tablet 3   prazosin  (MINIPRESS ) 1 MG capsule Take 1 mg by mouth at bedtime.     rOPINIRole  (REQUIP ) 4 MG tablet Take 4 mg by mouth at bedtime.     rosuvastatin  (CRESTOR ) 40 MG tablet TAKE 1 TABLET(40 MG) BY MOUTH DAILY 90 tablet 3   sildenafil (VIAGRA) 25 MG tablet Take by mouth.     TRESIBA FLEXTOUCH 100 UNIT/ML FlexTouch Pen daily. Inject on Wednesday        Home: Home Living Family/patient expects to be discharged to:: Private residence Living Arrangements: Spouse/significant other Available Help at Discharge: Family, Available PRN/intermittently Type of Home: House Home Access: Stairs to enter Secretary/administrator of Steps: threshhold Entrance Stairs-Rails: None Home Layout: One level Bathroom Shower/Tub: Health Visitor: Standard Home Equipment: Medical Laboratory Scientific Officer - single point, Rollator (4 wheels), Shower seat, Grab bars - tub/shower Additional Comments: has a 84yo old rat terrier named Maggie that he adores  Lives With: Spouse   Functional History: Prior Function Prior Level of Function : Independent/Modified Independent Mobility Comments: ambulatory with SPC vs rollator ADLs Comments: no assistance. per 2023 chart was driving. pt verbalized in session he drives  Functional Status:  Mobility: Bed Mobility Overal bed mobility: Needs Assistance Bed Mobility: Supine to Sit, Sit to Supine Rolling: Min assist, Used rails Supine to sit:  Supervision, Used rails, HOB elevated Sit to supine: Supervision General bed mobility comments: supervision for safety with increased time to complete Transfers Overall transfer level: Needs assistance Equipment used: Rolling walker (2 wheels) Transfers: Sit to/from Stand Sit to Stand: Contact guard assist Bed to/from chair/wheelchair/BSC transfer type:: Step pivot Step pivot transfers: Mod assist General transfer comment: CGA from EOB and low commode, some bracing of legs at EOB to stedy on rise and increased time to come to full upright standing Ambulation/Gait Ambulation/Gait assistance: Contact guard assist Gait Distance (Feet): 12 Feet (+ 75)  Assistive device: Rolling walker (2 wheels) Gait Pattern/deviations: Step-through pattern, Wide base of support, Decreased stride length General Gait Details: pt ambulating to bathroom at start of session then to hall, CGA for safety, no overt LOB, cues for closer RW proximity and upright trunk, distance limited to pt stated fatigue, Gait velocity: reduced Gait velocity interpretation: <1.8 ft/sec, indicate of risk for recurrent falls    ADL: ADL Grooming: Wash/dry face, Standing, Contact guard assist Upper Body Dressing : Sitting, Set up Lower Body Dressing: Sit to/from stand, Minimal assistance Lower Body Dressing Details (indicate cue type and reason): doffed/donned bilat socks with setup and increased time. Min A to don pants with STS Toilet Transfer: Contact guard assist, Rolling walker (2 wheels), Ambulation Functional mobility during ADLs: Contact guard assist, Rolling walker (2 wheels) General ADL Comments: During sink ADLs pt noted to have loose stool dropping onto ground. Immediately went to position pt on toilet and proceed with cleaning pt and getting him new garments  Cognition: Cognition Overall Cognitive Status: Impaired/Different from baseline Arousal/Alertness: Awake/alert Orientation Level: Oriented  X4 Cognition Arousal: Alert Behavior During Therapy: Flat affect Overall Cognitive Status: Impaired/Different from baseline Area of Impairment: Safety/judgement Safety/Judgement: Decreased awareness of safety Awareness: Emergent General Comments: pt continues to have no awareness of bowels,  Physical Exam: Blood pressure (!) 123/57, pulse 72, temperature 97.8 F (36.6 C), temperature source Oral, resp. rate 18, height 6' 2 (1.88 m), weight 67.4 kg, SpO2 91%. Physical Exam Vitals and nursing note reviewed.  Constitutional:      General: He is not in acute distress.    Appearance: Normal appearance. He is normal weight.     Comments: Pt awake, alert, appropriate, sitting up in bed watching TV, NAD  HENT:     Head: Normocephalic and atraumatic.     Comments: Eczema on face/forehead; and top of head; abrasion on top of nose     Right Ear: External ear normal.     Left Ear: External ear normal.     Nose: Nose normal. No congestion.     Comments: Abrasion- dark red    Mouth/Throat:     Mouth: Mucous membranes are dry.     Pharynx: Oropharynx is clear. No oropharyngeal exudate.  Eyes:     General:        Right eye: No discharge.        Left eye: No discharge.     Extraocular Movements: Extraocular movements intact.  Cardiovascular:     Rate and Rhythm: Normal rate and regular rhythm.     Heart sounds: Normal heart sounds. No murmur heard.    No gallop.  Pulmonary:     Effort: Pulmonary effort is normal. No respiratory distress.     Breath sounds: Normal breath sounds. No wheezing, rhonchi or rales.  Abdominal:     General: Bowel sounds are normal. There is no distension.     Palpations: Abdomen is soft.     Tenderness: There is no abdominal tenderness.  Musculoskeletal:     Cervical back: Neck supple. No tenderness.     Comments: RUE- deltoid 4+/5; Biceps 4+/5; Triceps 4/5; WE 4+/5; Grip 5-/5 and FA 4+/5 LUE 5/5 in same muscles RLE- 5-/5 throughout LLE- 5/5   Skin:     General: Skin is warm and dry.  Neurological:     Comments: Patient is awake alert follows commands.  Oriented to person place and time. No clonus; no hoffmans and no increased tone in Ue's and LE's.  Psychiatric:        Behavior: Behavior normal.     Comments: A little flat affect- doesn't speak much     Results for orders placed or performed during the hospital encounter of 03/14/23 (from the past 48 hours)  Glucose, capillary     Status: Abnormal   Collection Time: 03/21/23  6:24 AM  Result Value Ref Range   Glucose-Capillary 140 (H) 70 - 99 mg/dL    Comment: Glucose reference range applies only to samples taken after fasting for at least 8 hours.  Basic metabolic panel     Status: Abnormal   Collection Time: 03/21/23  6:43 AM  Result Value Ref Range   Sodium 136 135 - 145 mmol/L   Potassium 3.3 (L) 3.5 - 5.1 mmol/L   Chloride 99 98 - 111 mmol/L   CO2 25 22 - 32 mmol/L   Glucose, Bld 152 (H) 70 - 99 mg/dL    Comment: Glucose reference range applies only to samples taken after fasting for at least 8 hours.   BUN 28 (H) 8 - 23 mg/dL   Creatinine, Ser 8.72 (H) 0.61 - 1.24 mg/dL   Calcium  8.5 (L) 8.9 - 10.3 mg/dL   GFR, Estimated 56 (L) >60 mL/min    Comment: (NOTE) Calculated using the CKD-EPI Creatinine Equation (2021)    Anion gap 12 5 - 15    Comment: Performed at [facility], [address] [place], [place] [address]  CBC     Status: Abnormal   Collection Time: 03/21/23  6:43 AM  Result Value Ref Range   WBC 8.8 4.0 - 10.5 K/uL   RBC 2.68 (L) 4.22 - 5.81 MIL/uL   Hemoglobin 8.2 (L) 13.0 - 17.0 g/dL   HCT 75.4 (L) 60.9 - 47.9 %   MCV 91.4 80.0 - 100.0 fL   MCH 30.6 26.0 - 34.0 pg   MCHC 33.5 30.0 - 36.0 g/dL   RDW 86.9 88.4 - 84.4 %   Platelets 301 150 - 400 K/uL   nRBC 0.0 0.0 - 0.2 %    Comment: Performed at [facility], [address], [place], [place] [address]  Glucose, capillary     Status: Abnormal   Collection Time: 03/21/23  8:23 AM   Result Value Ref Range   Glucose-Capillary 163 (H) 70 - 99 mg/dL    Comment: Glucose reference range applies only to samples taken after fasting for at least 8 hours.  Glucose, capillary     Status: Abnormal   Collection Time: 03/21/23 11:11 AM  Result Value Ref Range   Glucose-Capillary 139 (H) 70 - 99 mg/dL    Comment: Glucose reference range applies only to samples taken after fasting for at least 8 hours.   Comment 1 Notify RN    Comment 2 Document in Chart   Urinalysis, Complete w Microscopic -Urine, Clean Catch     Status: Abnormal   Collection Time: 03/21/23 11:20 AM  Result Value Ref Range   Color, Urine YELLOW YELLOW   APPearance HAZY (A) CLEAR   Specific Gravity, Urine 1.017 1.005 - 1.030   pH 5.0 5.0 - 8.0   Glucose, UA NEGATIVE NEGATIVE mg/dL   Hgb urine dipstick SMALL (A) NEGATIVE   Bilirubin Urine NEGATIVE NEGATIVE   Ketones, ur 5 (A) NEGATIVE mg/dL   Protein, ur 30 (A) NEGATIVE mg/dL   Nitrite NEGATIVE NEGATIVE   Leukocytes,Ua NEGATIVE NEGATIVE   RBC / HPF 0-5 0 - 5 RBC/hpf  WBC, UA 0-5 0 - 5 WBC/hpf   Bacteria, UA RARE (A) NONE SEEN   Squamous Epithelial / HPF 0-5 0 - 5 /HPF   Mucus PRESENT    Hyaline Casts, UA PRESENT     Comment: Performed at [facility], [address] [place], [place] [address]  Glucose, capillary     Status: Abnormal   Collection Time: 03/21/23  4:13 PM  Result Value Ref Range   Glucose-Capillary 156 (H) 70 - 99 mg/dL    Comment: Glucose reference range applies only to samples taken after fasting for at least 8 hours.   Comment 1 Notify RN    Comment 2 Document in Chart   Glucose, capillary     Status: Abnormal   Collection Time: 03/21/23  9:30 PM  Result Value Ref Range   Glucose-Capillary 176 (H) 70 - 99 mg/dL    Comment: Glucose reference range applies only to samples taken after fasting for at least 8 hours.  Basic metabolic panel     Status: Abnormal   Collection Time: 03/22/23  6:10 AM  Result Value Ref Range    Sodium 135 135 - 145 mmol/L   Potassium 4.0 3.5 - 5.1 mmol/L   Chloride 100 98 - 111 mmol/L   CO2 25 22 - 32 mmol/L   Glucose, Bld 166 (H) 70 - 99 mg/dL    Comment: Glucose reference range applies only to samples taken after fasting for at least 8 hours.   BUN 22 8 - 23 mg/dL   Creatinine, Ser 8.87 0.61 - 1.24 mg/dL   Calcium  8.3 (L) 8.9 - 10.3 mg/dL   GFR, Estimated >39 >39 mL/min    Comment: (NOTE) Calculated using the CKD-EPI Creatinine Equation (2021)    Anion gap 10 5 - 15    Comment: Performed at [facility], [address] [place], [place] [address]  CBC     Status: Abnormal   Collection Time: 03/22/23  6:10 AM  Result Value Ref Range   WBC 7.5 4.0 - 10.5 K/uL   RBC 2.61 (L) 4.22 - 5.81 MIL/uL   Hemoglobin 8.1 (L) 13.0 - 17.0 g/dL   HCT 75.7 (L) 60.9 - 47.9 %   MCV 92.7 80.0 - 100.0 fL   MCH 31.0 26.0 - 34.0 pg   MCHC 33.5 30.0 - 36.0 g/dL   RDW 86.8 88.4 - 84.4 %   Platelets 315 150 - 400 K/uL   nRBC 0.0 0.0 - 0.2 %    Comment: Performed at [facility], [address], [place], [place] [address]  Glucose, capillary     Status: Abnormal   Collection Time: 03/22/23  6:13 AM  Result Value Ref Range   Glucose-Capillary 183 (H) 70 - 99 mg/dL    Comment: Glucose reference range applies only to samples taken after fasting for at least 8 hours.  Glucose, capillary     Status: Abnormal   Collection Time: 03/22/23 11:58 AM  Result Value Ref Range   Glucose-Capillary 197 (H) 70 - 99 mg/dL    Comment: Glucose reference range applies only to samples taken after fasting for at least 8 hours.   Comment 1 Notify RN    Comment 2 Document in Chart   Glucose, capillary     Status: Abnormal   Collection Time: 03/22/23  4:24 PM  Result Value Ref Range   Glucose-Capillary 144 (H) 70 - 99 mg/dL    Comment: Glucose reference range applies  only to samples taken after fasting for at least 8 hours.   Comment 1 Notify RN    Comment 2 Document in Chart   Glucose, capillary      Status: Abnormal   Collection Time: 03/22/23  9:23 PM  Result Value Ref Range   Glucose-Capillary 187 (H) 70 - 99 mg/dL    Comment: Glucose reference range applies only to samples taken after fasting for at least 8 hours.   Comment 1 Notify RN    Comment 2 Document in Chart    No results found.    Blood pressure (!) 123/57, pulse 72, temperature 97.8 F (36.6 C), temperature source Oral, resp. rate 18, height 6' 2 (1.88 m), weight 67.4 kg, SpO2 91%.  Medical Problem List and Plan: 1. 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  Soft cervical collar for comfort.  -patient may  shower  -ELOS/Goals: 8-11 days- supervision 2.  Antithrombotics: -DVT/anticoagulation:  Mechanical: Antiembolism stockings, thigh (TED hose) Bilateral lower extremities  -antiplatelet therapy: Aspirin  81 mg daily 3. Pain Management/chronic back pain: Neurontin  100 mg nightly, Cymbalta  30 mg daily 4. Mood/Behavior/Sleep: Provide emotional support  -antipsychotic agents: N/A 5. Neuropsych/cognition: This patient is capable of making decisions on his own behalf. 6. Skin/Wound Care: Routine skin checks 7. Fluids/Electrolytes/Nutrition: Routine in and outs with follow-up chemistries 8.  History of orthostatic hypotension.  ProAmatine  5 mg 3 times daily.  Patient followed by [facility] neurology 9.  Restless leg syndrome.  Requip  4 mg nightly 10.  Hyperlipidemia.  Crestor  11.  Diabetes mellitus with peripheral neuropathy.  Hemoglobin A1c 7.5.  Currently on SSI.  PTA patient on Trulicity  3 mg every Wednesday, Semglee 18 units daily, Tresiba FlexTouch weekly, Glucophage  1000 mg twice daily.  Resume as needed 12.  AKI.  Follow-up chemistries 13.  CAD with stenting.  Plavix  currently on hold and aspirin  has been initiated. 14.  Chronic anemia.  Continue iron supplement.   Follow-up CBC 15.  History of tobacco use.  Provide counseling 16. Droplet precautions- has been in hospital since 12/29- on droplet precautions the entire time- no coughing, cold S'xs- will send if can remove   Toribio JINNY Pitch, PA-C 03/23/2023   I have personally performed a face to face diagnostic evaluation of this patient and formulated the key components of the plan.  Additionally, I have personally reviewed laboratory data, imaging studies, as well as relevant notes and concur with the physician assistant's documentation above.   The patient's status has not changed from the original H&P.  Any changes in documentation from the acute care chart have been noted above.

## 2023-03-22 NOTE — Plan of Care (Signed)
  Problem: Education: Goal: Knowledge of disease or condition will improve Outcome: Progressing   Problem: Self-Care: Goal: Ability to participate in self-care as condition permits will improve Outcome: Progressing   Problem: Education: Goal: Knowledge of General Education information will improve Description: Including pain rating scale, medication(s)/side effects and non-pharmacologic comfort measures Outcome: Progressing   Problem: Clinical Measurements: Goal: Ability to maintain clinical measurements within normal limits will improve Outcome: Progressing Goal: Will remain free from infection Outcome: Progressing Goal: Cardiovascular complication will be avoided Outcome: Progressing   Problem: Activity: Goal: Risk for activity intolerance will decrease Outcome: Progressing   Problem: Elimination: Goal: Will not experience complications related to urinary retention Outcome: Progressing   Problem: Pain Management: Goal: General experience of comfort will improve Outcome: Progressing

## 2023-03-22 NOTE — Progress Notes (Addendum)
 STROKE TEAM PROGRESS NOTE   SUBJECTIVE (INTERVAL HISTORY) No family is at the bedside. No complains, no acute event overnight. On soft C-collar. Hb stable. Cre normalized now.   OBJECTIVE Temp:  [97.4 F (36.3 C)-99.2 F (37.3 C)] 98.3 F (36.8 C) (01/06 1157) Pulse Rate:  [71-80] 71 (01/06 1157) Cardiac Rhythm: Normal sinus rhythm (01/06 0700) Resp:  [17-18] 17 (01/06 1157) BP: (111-142)/(53-67) 130/58 (01/06 1157) SpO2:  [93 %-96 %] 96 % (01/06 1157)  Recent Labs  Lab 03/21/23 1111 03/21/23 1613 03/21/23 2130 03/22/23 0613 03/22/23 1158  GLUCAP 139* 156* 176* 183* 197*   Recent Labs  Lab 03/18/23 0728 03/19/23 0722 03/20/23 0559 03/21/23 0643 03/22/23 0610  NA 136 135 136 136 135  K 3.8 3.4* 3.4* 3.3* 4.0  CL 100 100 100 99 100  CO2 25 22 23 25 25   GLUCOSE 146* 162* 155* 152* 166*  BUN 30* 33* 33* 28* 22  CREATININE 1.55* 1.50* 1.39* 1.27* 1.12  CALCIUM  8.3* 8.4* 8.3* 8.5* 8.3*   Recent Labs  Lab 03/18/23 0728 03/19/23 0722 03/20/23 0559 03/21/23 0643 03/22/23 0610  WBC 6.8 11.6* 11.4* 8.8 7.5  HGB 8.1* 8.8* 8.2* 8.2* 8.1*  HCT 23.9* 26.6* 24.2* 24.5* 24.2*  MCV 91.9 93.0 92.4 91.4 92.7  PLT 191 256 276 301 315   Recent Labs    03/21/23 1120  COLORURINE YELLOW  LABSPEC 1.017  PHURINE 5.0  GLUCOSEU NEGATIVE  HGBUR SMALL*  BILIRUBINUR NEGATIVE  KETONESUR 5*  PROTEINUR 30*  NITRITE NEGATIVE  LEUKOCYTESUR NEGATIVE       Component Value Date/Time   CHOL 61 03/14/2023 0405   CHOL 137 10/03/2020 1138   TRIG 47 03/14/2023 0405   HDL 33 (L) 03/14/2023 0405   HDL 33 (L) 10/03/2020 1138   CHOLHDL 1.8 03/14/2023 0405   VLDL 9 03/14/2023 0405   LDLCALC 19 03/14/2023 0405   LDLCALC 82 10/03/2020 1138   Lab Results  Component Value Date   HGBA1C 7.5 (H) 03/15/2023   No results found for: LABOPIA, COCAINSCRNUR, LABBENZ, AMPHETMU, THCU, LABBARB  No results for input(s): ETH in the [facility] 168 hours.  I have personally reviewed the  radiological images below and agree with the radiology interpretations.  DG CHEST PORT 1 VIEW Result Date: 03/20/2023 CLINICAL DATA:  Leukocytosis. EXAM: PORTABLE CHEST 1 VIEW COMPARISON:  Radiographs 03/14/2023 and 11/01/2022. Chest CT 03/14/2023 and abdominal CT 03/15/2023. FINDINGS: 0633 hours. The heart size and mediastinal contours are stable with aortic atherosclerosis. There are lower lung volumes with increased patchy bibasilar airspace opacities compared with the 03/14/2023 radiographs. These are similar to the more recent abdominal CT of 03/15/2023 and suspicious for bronchopneumonia or aspiration. No pleural effusion or pneumothorax. The bones appear unchanged. IMPRESSION: Lower lung volumes with increased patchy bibasilar airspace opacities suspicious for bronchopneumonia or aspiration. Electronically Signed   By: Elsie Perone M.D.   On: 03/20/2023 10:27   MR CERVICAL SPINE WO CONTRAST Result Date: 03/18/2023 CLINICAL DATA:  Ataxia, cervical trauma. EXAM: MRI CERVICAL SPINE WITHOUT CONTRAST TECHNIQUE: Multiplanar, multisequence MR imaging of the cervical spine was performed. No intravenous contrast was administered. COMPARISON:  Cervical spine CT 03/14/2023 FINDINGS: Alignment: Mild reversal of the normal cervical lordosis. Trace anterolisthesis of C3 on C4 and C4 on C5, unchanged. Vertebrae: No acute fracture or suspicious marrow lesion. Cord: Abnormal confluent T2 hyperintensity in the right aspect of the spinal cord extending from the superior C3 level to the C4-5 disc space level, greatest at C4. No  evidence of cord hemorrhage on gradient echo imaging. Small amount of abnormal ventral STIR hyperintense signal behind the C3 vertebral body and C2-3 disc space measuring up to 2 mm in thickness and suspicious for a small epidural hematoma. Possible trace ventral and dorsal epidural hematoma at C4 and C5. Possible posterior longitudinal ligament disruption at C2-3. Posterior Fossa, vertebral  arteries, paraspinal tissues: No significant prevertebral fluid or soft tissue swelling. Preserved vertebral artery flow voids. 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 C2-3: Mild uncovertebral spurring, asymmetrically severe left facet arthrosis with facet ankylosis, and suspected small volume epidural hematoma result in mild spinal stenosis and mild left neural foraminal stenosis. C3-4: Disc bulging, uncovertebral spurring, suspected small volume epidural hematoma, and moderate right and severe left facet arthrosis result in moderate spinal stenosis and mild right and severe left neural foraminal stenosis. C4-5: Disc bulging, uncovertebral spurring, and moderate to severe right and mild left facet arthrosis result in moderate spinal stenosis and mild-to-moderate right neural foraminal stenosis. C5-6: Disc bulging and uncovertebral spurring result in moderate spinal stenosis without significant neural foraminal stenosis. C6-7: Mild disc bulging without significant stenosis. C7-T1: Negative. These results will be called to the ordering clinician or representative by the Radiologist Assistant, and communication documented in the PACS or [employer]. IMPRESSION: 1. Abnormal cord signal at C3-4 and C4-5 compatible with edema/contusion. No evidence of cord hemorrhage. 2. Suspected small volume epidural hematoma in the upper cervical spine with possible PLL disruption at C2-3. 3. 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 Electronically Signed   By: Dasie Hamburg M.D.   On: 03/18/2023 19:48   ECHOCARDIOGRAM COMPLETE Result Date: 03/15/2023    ECHOCARDIOGRAM REPORT   Patient Name:   Harry [facility] Date of Exam: 03/15/2023 Medical Rec #:  [id number]      Height:       74.0 in Accession #:    [id number]     Weight:       148.6 lb Date of Birth:  February 13, 1941        BSA:          1.916 m Patient Age:    82 years       BP:           116/51 mmHg Patient Gender: M              HR:           90 bpm. Exam Location:  Inpatient Procedure: 2D Echo, 3D Echo, Cardiac Doppler, Color Doppler and Strain Analysis Indications:    Stroke  History:        Patient has no prior history of Echocardiogram examinations.                 Risk Factors:Hypertension, Diabetes, Dyslipidemia and Current                 Smoker.  Sonographer:    Ozell Free Referring Phys: [id number] ASHISH ARORA  Sonographer Comments: Global longitudinal strain was attempted. IMPRESSIONS  1. Left ventricular ejection fraction, by estimation, is 55 to 60%. The left ventricle has normal function. The left ventricle has no regional wall motion abnormalities. Left ventricular diastolic parameters were normal.  2. Right ventricular systolic function is normal. The right ventricular size is normal. There is moderately elevated pulmonary artery systolic pressure.  3. The mitral valve is normal in structure. No evidence of mitral  valve regurgitation. No evidence of mitral stenosis.  4. The aortic valve is normal in structure. Aortic valve regurgitation is not visualized. No aortic stenosis is present.  5. The inferior vena cava is dilated in size with >50% respiratory variability, suggesting right atrial pressure of 8 mmHg. FINDINGS  Left Ventricle: Left ventricular ejection fraction, by estimation, is 55 to 60%. The left ventricle has normal function. The left ventricle has no regional wall motion abnormalities. The left ventricular internal cavity size was normal in size. There is  no left ventricular hypertrophy. Left ventricular diastolic parameters were normal. Right Ventricle: The right ventricular size is normal. No increase in right ventricular wall thickness. Right ventricular systolic function is normal. There is moderately elevated pulmonary artery systolic pressure. The tricuspid regurgitant velocity is 2.94 m/s, and with  an assumed right atrial pressure of 15 mmHg, the estimated right ventricular systolic pressure is 49.6 mmHg. Left Atrium: Left atrial size was normal in size. Right Atrium: Right atrial size was normal in size. Pericardium: There is no evidence of pericardial effusion. Mitral Valve: The mitral valve is normal in structure. No evidence of mitral valve regurgitation. No evidence of mitral valve stenosis. Tricuspid Valve: The tricuspid valve is normal in structure. Tricuspid valve regurgitation is trivial. No evidence of tricuspid stenosis. Aortic Valve: The aortic valve is normal in structure. Aortic valve regurgitation is not visualized. No aortic stenosis is present. Aortic valve mean gradient measures 5.0 mmHg. Aortic valve peak gradient measures 9.5 mmHg. Aortic valve area, by VTI measures 2.57 cm. Pulmonic Valve: The pulmonic valve was normal in structure. Pulmonic valve regurgitation is not visualized. No evidence of pulmonic stenosis. Aorta: The aortic root is normal in size and structure. Venous: The inferior vena cava is dilated in size with greater than 50% respiratory variability, suggesting right atrial pressure of 8 mmHg. IAS/Shunts: No atrial level shunt detected by color flow Doppler.  LEFT VENTRICLE PLAX 2D LVIDd:         4.40 cm   Diastology LVIDs:         2.80 cm   LV e' medial:    8.92 cm/s LV PW:         1.00 cm   LV E/e' medial:  8.4 LV IVS:        1.00 cm   LV e' lateral:   9.57 cm/s LVOT diam:     2.20 cm   LV E/e' lateral: 7.8 LV SV:         68 LV SV Index:   36 LVOT Area:     3.80 cm                           3D Volume EF:                          3D EF:        53 %                          LV EDV:       150 ml                          LV ESV:       70 ml  LV SV:        80 ml RIGHT VENTRICLE             IVC RV Basal diam:  4.40 cm     IVC diam: 2.30 cm RV S prime:     15.30 cm/s TAPSE (M-mode): 3.4 cm LEFT ATRIUM           Index        RIGHT ATRIUM           Index LA  diam:      3.40 cm 1.77 cm/m   RA Area:     17.80 cm LA Vol (A2C): 59.8 ml 31.22 ml/m  RA Volume:   50.70 ml  26.47 ml/m LA Vol (A4C): 55.8 ml 29.13 ml/m  AORTIC VALVE AV Area (Vmax):    2.24 cm AV Area (Vmean):   2.24 cm AV Area (VTI):     2.57 cm AV Vmax:           154.00 cm/s AV Vmean:          106.000 cm/s AV VTI:            0.265 m AV Peak Grad:      9.5 mmHg AV Mean Grad:      5.0 mmHg LVOT Vmax:         90.70 cm/s LVOT Vmean:        62.400 cm/s LVOT VTI:          0.179 m LVOT/AV VTI ratio: 0.68  AORTA Ao Root diam: 3.60 cm Ao Asc diam:  3.00 cm MITRAL VALVE               TRICUSPID VALVE MV Area (PHT): 3.68 cm    TR Peak grad:   34.6 mmHg MV Decel Time: 206 msec    TR Vmax:        294.00 cm/s MV E velocity: 74.90 cm/s MV A velocity: 94.30 cm/s  SHUNTS MV E/A ratio:  0.79        Systemic VTI:  0.18 m                            Systemic Diam: 2.20 cm Aditya Sabharwal Electronically signed by Ria Commander Signature Date/Time: 03/15/2023/10:55:31 AM    Final    CT ABDOMEN PELVIS WO CONTRAST Result Date: 03/15/2023 CLINICAL DATA:  Abdominal pain. Acute decrease in hemoglobin with tender abdomen. Evaluate for hematoma EXAM: CT ABDOMEN AND PELVIS WITHOUT CONTRAST TECHNIQUE: Multidetector CT imaging of the abdomen and pelvis was performed following the standard protocol without IV contrast. RADIATION DOSE REDUCTION: This exam was performed according to the departmental dose-optimization program which includes automated exposure control, adjustment of the mA and/or kV according to patient size and/or use of iterative reconstruction technique. COMPARISON:  03/14/2023 FINDINGS: Lower chest: Dependent collapse/consolidation in both lower lobes is progressive in the interval with areas of peripheral airway impaction in the dependent lower lungs. Hepatobiliary: No suspicious focal abnormality in the liver on this study without intravenous contrast. Cholecystectomy. No intrahepatic or extrahepatic biliary  dilation. Pancreas: No focal mass lesion. No dilatation of the main duct. No intraparenchymal cyst. Diffuse atrophy. Spleen: No splenomegaly. No suspicious focal mass lesion. Adrenals/Urinary Tract: No adrenal nodule or mass. Small nonobstructing stones are evident in both kidneys. High attenuation in the renal pelvis of each kidney in the ureters and the bladder lumen is compatible with excreted contrast from yesterday's CT scan.  No hydroureteronephrosis. Bladder lumen opacified but otherwise unremarkable. Stomach/Bowel: Stomach is unremarkable. No gastric wall thickening. No evidence of outlet obstruction. Duodenum is normally positioned as is the ligament of Treitz. No small bowel wall thickening. No small bowel dilatation. The terminal ileum is normal. The appendix is normal. The tip of the appendix appears to be filled with soft tissue (51/3) without distension. This finding is stable comparing back to a CT from 03/24/2021 but appears minimally progressive when comparing to 09/12/2020. No gross colonic mass. No colonic wall thickening. Moderate to large stool volume evident. Vascular/Lymphatic: There is moderate atherosclerotic calcification of the abdominal aorta without aneurysm. There is no gastrohepatic or hepatoduodenal ligament lymphadenopathy. No retroperitoneal or mesenteric lymphadenopathy. No pelvic sidewall lymphadenopathy. 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 Other: No intraperitoneal free fluid. Musculoskeletal: No pelvic sidewall or retroperitoneal hematoma. There is some minimal thickening of the right posterior renal fascia, stable to minimally progressive since 03/14/2023, but this is only minimal and is nonspecific, not representing a discrete hematoma or substantial hemorrhage. Lumbar fusion hardware evident. The patient is noted to have asymmetry of the gluteus musculature inferiorly with asymmetric enlargement on the left although this is stable to minimally  increased compared to 03/14/2023 and has been incompletely visualized. IMPRESSION: 1. No pelvic sidewall or retroperitoneal hematoma. There is some minimal thickening of the right posterior renal fascia, stable to minimally progressive since 03/14/2023, but this is only minimal and is nonspecific, not representing a discrete hematoma or substantial hemorrhage. 2. Asymmetry of the gluteus musculature inferiorly with asymmetric enlargement on the left although this is stable to may be minimally increased compared to 03/14/2023 and has been incompletely visualized. There is not a discrete hematoma a discernible on this study but a component of intramuscular hemorrhage could have this appearance. This would likely be amenable to clinical inspection. 3. Dependent collapse/consolidation in both lower lobes is progressive in the interval with areas of peripheral airway impaction in the dependent lower lungs. Imaging features compatible may be infectious although aspiration could have this appearance. 4. The tip of the appendix appears to be filled with soft tissue without distension. This finding is stable comparing back to a CT from 03/24/2021 but appears minimally progressive when comparing to 09/12/2020. This is a nonspecific finding and may be related to mucocele. Follow-up CT in 6 months recommended. Non emergent follow-up surgical consultation recommended. 5. Small nonobstructing stones in both kidneys. 6.  Aortic Atherosclerosis (ICD10-I70.0). Electronically Signed   By: Camellia Candle M.D.   On: 03/15/2023 06:24   MR BRAIN WO CONTRAST Result Date: 03/15/2023 CLINICAL DATA:  Stroke/TIA.  Status post T NK. EXAM: MRI HEAD WITHOUT CONTRAST TECHNIQUE: Multiplanar, multiecho pulse sequences of the brain and surrounding structures were obtained without intravenous contrast. COMPARISON:  01/18/2023 FINDINGS: Brain: No acute infarct, mass effect or extra-axial collection. 3-5 chronic microhemorrhages in the cerebellum.  There is multifocal hyperintense T2-weighted signal within the white matter. Parenchymal volume and CSF spaces are normal. The midline structures are normal. Vascular: Normal flow voids. Skull and upper cervical spine: Posterior scalp subgaleal collection. Sinuses/Orbits:No paranasal sinus fluid levels or advanced mucosal thickening. No mastoid or middle ear effusion. Normal orbits. IMPRESSION: 1. No acute intracranial abnormality. 2. Findings of chronic small vessel ischemia. 3. Posterior scalp subgaleal hematoma. Electronically Signed   By: Franky Stanford M.D.   On: 03/15/2023 00:01   CT HEAD WO CONTRAST ( ) Result Date: 03/14/2023 CLINICAL DATA:  Stroke, follow-up. Persistent right upper extremity ataxia and right  lower extremity dysmetria. Status post TNK. EXAM: CT HEAD WITHOUT CONTRAST TECHNIQUE: Contiguous axial images were obtained from the base of the skull through the vertex without intravenous contrast. RADIATION DOSE REDUCTION: This exam was performed according to the departmental dose-optimization program which includes automated exposure control, adjustment of the mA and/or kV according to patient size and/or use of iterative reconstruction technique. COMPARISON:  CT head and CT angio head and neck 03/14/2023 at 12:15 a.m. FINDINGS: Brain: Mild atrophy and white matter disease is stable. No acute infarct or hemorrhage is present. Deep brain nuclei are within normal limits. The ventricles are proportionate to the degree of atrophy. No significant extraaxial fluid collection is present. The brainstem and cerebellum are within normal limits. Midline structures are within normal limits. Vascular: Atherosclerotic calcifications are again noted within the cavernous internal carotid arteries bilaterally and at the dural margin of the right vertebral artery. No hyperdense vessel is present. Skull: A left parietal and occipital scalp hematoma is new. No acute fracture is present. Sinuses/Orbits: The  paranasal sinuses and mastoid air cells are clear. Bilateral lens replacements are noted. Globes and orbits are otherwise unremarkable. IMPRESSION: 1. No acute intracranial abnormality or significant interval change. 2. Stable atrophy and white matter disease. This likely reflects the sequela of chronic microvascular ischemia. 3. New left parietal and occipital scalp hematoma without underlying fracture. Electronically Signed   By: Lonni Necessary M.D.   On: 03/14/2023 17:54   CT CHEST ABDOMEN PELVIS W CONTRAST Result Date: 03/14/2023 CLINICAL DATA:  Chest trauma, blunt Abdominal trauma, blunt. Stroke, fall. EXAM: CT CHEST, ABDOMEN, AND PELVIS WITH CONTRAST TECHNIQUE: Multidetector CT imaging of the chest, abdomen and pelvis was performed following the standard protocol during bolus administration of intravenous contrast. RADIATION DOSE REDUCTION: This exam was performed according to the departmental dose-optimization program which includes automated exposure control, adjustment of the mA and/or kV according to patient size and/or use of iterative reconstruction technique. CONTRAST:  75mL OMNIPAQUE  IOHEXOL  350 MG/ML SOLN COMPARISON:  CT angio chest 11/18/2022, CT angio chest 03/18/2021 FINDINGS: CHEST: Cardiovascular: No aortic injury. The thoracic aorta is normal in caliber. The heart is normal in size. No significant pericardial effusion. Moderate atherosclerotic plaque. At least 2 vessel coronary artery calcification. The main pulmonary artery is normal in caliber. No central or segmental pulmonary embolus. Limited evaluation more distally due to timing of contrast and motion artifact. Mediastinum/Nodes: No pneumomediastinum. No mediastinal hematoma. The esophagus is unremarkable. The thyroid  is unremarkable. The central airways are patent. No mediastinal, hilar, or axillary lymphadenopathy. Lungs/Pleura: Bilateral lower lobe bronchial wall thickening. Bilateral lower lobe mucous plugging, right greater  than left. Bibasilar atelectasis. Left lower lobe ground-glass airspace opacity. Stable left lower lobe calcified pulmonary micronodule-no further follow-up indicated. No pulmonary mass. No pulmonary contusion or laceration. No pneumatocele formation. No pleural effusion. No pneumothorax. No hemothorax. Musculoskeletal/Chest wall: No chest wall mass. No acute rib or sternal fracture. No spinal fracture. ABDOMEN / PELVIS: Hepatobiliary: Not enlarged. No focal lesion. No laceration or subcapsular hematoma. Status post cholecystectomy.  No biliary ductal dilatation. Pancreas: Normal pancreatic contour. No main pancreatic duct dilatation. Spleen: Not enlarged. No focal lesion. No laceration, subcapsular hematoma, or vascular injury. Adrenals/Urinary Tract: No nodularity bilaterally. Bilateral kidneys enhance and excrete symmetrically. No hydronephrosis. No contusion, laceration, or subcapsular hematoma. No injury to the vascular structures or collecting systems. No hydroureter. The urinary bladder is unremarkable. Stomach/Bowel: No small or large bowel wall thickening or dilatation. Stool throughout the colon. The appendix is unremarkable.  Vasculature/Lymphatics: Severe atherosclerotic plaque no abdominal aorta or iliac aneurysm. No active contrast extravasation or pseudoaneurysm. No abdominal, pelvic, inguinal lymphadenopathy. Reproductive: Radiation seeds along the prostate. Other: No simple free fluid ascites. No pneumoperitoneum. No hemoperitoneum. No mesenteric hematoma identified. No organized fluid collection. Musculoskeletal: No significant soft tissue hematoma. No acute pelvic fracture. No spinal fracture. L2-S2 posterolateral interbody surgical hardware fusion. Ports and Devices: None. IMPRESSION: 1. No acute intrathoracic, intra-abdominal, intrapelvic traumatic injury. 2. No acute fracture or traumatic malalignment of the thoracic or lumbar spine. 3. Other imaging findings of potential clinical significance:  Bilateral lower lobe bronchial wall thickening with bilateral mucous plugging and patchy ground-glass airspace opacity left lower lobe-question developing infection/inflammation. Stool throughout the colon-correlate for constipation. Aortic Atherosclerosis (ICD10-I70.0)-severe. Electronically Signed   By: Morgane  Naveau M.D.   On: 03/14/2023 03:34   DG Chest Port 1 View Result Date: 03/14/2023 CLINICAL DATA:  Recent fall EXAM: PORTABLE CHEST 1 VIEW COMPARISON:  11/01/2022 FINDINGS: Cardiac shadows within normal limits. Aortic calcifications are noted. Skin fold is noted over the right chest. Minimal left basilar atelectasis is seen. No focal confluent infiltrate is noted. No bony abnormality is noted. IMPRESSION: Mild left basilar atelectasis. Electronically Signed   By: Oneil Devonshire M.D.   On: 03/14/2023 00:58   CT ANGIO HEAD NECK W WO CM (CODE STROKE) Result Date: 03/14/2023 CLINICAL DATA:  Right arm weakness, right pupil blown, head trauma EXAM: CT ANGIOGRAPHY HEAD AND NECK WITH AND WITHOUT CONTRAST TECHNIQUE: Multidetector CT imaging of the head and neck was performed using the standard protocol during bolus administration of intravenous contrast. Multiplanar CT image reconstructions and MIPs were obtained to evaluate the vascular anatomy. Carotid stenosis measurements (when applicable) are obtained utilizing NASCET criteria, using the distal internal carotid diameter as the denominator. RADIATION DOSE REDUCTION: This exam was performed according to the departmental dose-optimization program which includes automated exposure control, adjustment of the mA and/or kV according to patient size and/or use of iterative reconstruction technique. CONTRAST:  75mL OMNIPAQUE  IOHEXOL  350 MG/ML SOLN COMPARISON:  09/13/2022 CTA head and neck, correlation is made with 03/14/2023 CT head FINDINGS: CT HEAD FINDINGS For noncontrast findings, please see same day CT head. CTA NECK FINDINGS Aortic arch: Standard branching.  Imaged portion shows no evidence of aneurysm or dissection. No significant stenosis of the major arch vessel origins. Aortic atherosclerosis Right carotid system: No evidence of dissection, occlusion, or hemodynamically significant stenosis (greater than 50%). Left carotid system: No evidence of dissection, occlusion, or hemodynamically significant stenosis (greater than 50%). Vertebral arteries: Moderate stenosis at the origin of the left vertebral artery. Evaluation of the proximal left vertebral artery is limited by beam hardening artifact from the adjacent contrast bolus. The left vertebral artery is otherwise patent to the skull base. Mild stenosis at the origin of the right vertebral artery. The right vertebral artery is otherwise patent to the skull base. No evidence of dissection. Skeleton: No acute osseous abnormality. Degenerative changes in the cervical spine. Other neck: No acute finding. Upper chest: No focal pulmonary opacity or pleural effusion. Review of the MIP images confirms the above findings CTA HEAD FINDINGS Anterior circulation: Both internal carotid arteries are patent to the termini, with mild stenosis in the bilateral cavernous segments and moderate to severe stenosis in the proximal supraclinoid segments bilaterally. A1 segments patent. Normal anterior communicating artery. Anterior cerebral arteries are patent to their distal aspects without significant stenosis. No M1 stenosis or occlusion. MCA branches are irregular but perfused to their distal  aspects without significant stenosis. Posterior circulation: Vertebral arteries patent to the vertebrobasilar junction, with mild stenosis in the proximal right V4. The left vertebral artery primarily supplies the left PICA, and is quite diminutive after the PICA takeoff. Posterior inferior cerebellar arteries patent proximally. Basilar patent to its distal aspect without significant stenosis. Superior cerebellar arteries patent proximally. The  redemonstrated severe stenosis of the right PCA near the P1-P2 junction (series 7, image 99), with the remainder the right PCA being diminutive and poorly opacified. Redemonstrated severe stenosis in the proximal left P2 (series 7, images 98-99), with multifocal irregularity in the remainder of the left P2. Evaluation of the more distal PCAs is limited by venous contamination. The bilateral posterior communicating arteries are not definitively seen. Venous sinuses: As permitted by contrast timing, patent. Anatomic variants: None significant. No evidence of aneurysm or vascular malformation. Review of the MIP images confirms the above findings IMPRESSION: 1. No intracranial large vessel occlusion. 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 2. 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 3. 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 4. Moderate stenosis at the origin of the left vertebral artery and mild stenosis at the origin of the right vertebral artery. 5. Aortic atherosclerosis. Aortic Atherosclerosis (ICD10-I70.0). Electronically Signed   By: Donald Campion M.D.   On: 03/14/2023 00:46   CT Cervical Spine Wo Contrast Result Date: 03/14/2023 CLINICAL DATA:  Neck trauma, fell backwards EXAM: CT CERVICAL SPINE WITHOUT CONTRAST TECHNIQUE: Multidetector CT imaging of the cervical spine was performed without intravenous contrast. Multiplanar CT image reconstructions were also generated. RADIATION DOSE REDUCTION: This exam was performed according to the departmental dose-optimization program which includes automated exposure control, adjustment of the mA and/or kV according to patient size and/or use of iterative reconstruction technique. COMPARISON:  02/21/2023 CT cervical spine FINDINGS: Alignment:  No traumatic listhesis. Straightening of the normal cervical lordosis. Trace stepwise anterolisthesis of C3 on C4 and C4 on C5. Skull base and vertebrae: No acute fracture. No primary bone lesion or focal pathologic process. Soft tissues and spinal canal: No prevertebral fluid or swelling. No visible canal hematoma. Disc levels: Degenerative changes in the cervical spine. No high-grade spinal canal stenosis. Upper chest: Negative. IMPRESSION: No acute fracture or traumatic listhesis in the cervical spine. Electronically Signed   By: Donald Campion M.D.   On: 03/14/2023 00:34   CT HEAD CODE STROKE WO CONTRAST Result Date: 03/14/2023 CLINICAL DATA:  Code stroke. Right arm weakness, right pupil blown, trauma to posterior head after fall EXAM: CT HEAD WITHOUT CONTRAST TECHNIQUE: Contiguous axial images were obtained from the base of the skull through the vertex without intravenous contrast. RADIATION DOSE REDUCTION: This exam was performed according to the departmental dose-optimization program which includes automated exposure control, adjustment of the mA and/or kV according to patient size and/or use of iterative reconstruction technique. COMPARISON:  02/21/2023 CT head FINDINGS: Brain: No evidence of acute infarction, hemorrhage, mass, mass effect, or midline shift. No hydrocephalus or extra-axial collection. Basal ganglia calcifications. Normal cerebral volume. Vascular: No hyperdense vessel. Atherosclerotic calcifications in the intracranial carotid and vertebral arteries. Skull: Negative for fracture or focal lesion. Left occipital scalp hematoma. Sinuses/Orbits: No acute finding. Other: The mastoid air cells are well aerated. ASPECTS [facility] Stroke Program Early CT Score) - Ganglionic level infarction (caudate, lentiform  nuclei, internal capsule, insula, M1-M3 cortex): 7 - Supraganglionic infarction (M4-M6 cortex): 3 Total score (0-10 with 10 being normal): 10 IMPRESSION: 1. No acute intracranial process.  ASPECTS is 10. 2. Left occipital scalp hematoma. Imaging results were communicated on 03/14/2023 at 12:21 am to provider Dr. Voncile via secure text paging. Electronically Signed   By: Donald Campion M.D.   On: 03/14/2023 00:21   CT Cervical Spine Wo Contrast Result Date: 02/21/2023 CLINICAL DATA:  83 year old male status post fall yesterday, legs gave out. Abrasions. Blurred vision. EXAM: CT CERVICAL SPINE WITHOUT CONTRAST TECHNIQUE: Multidetector CT imaging of the cervical spine was performed without intravenous contrast. Multiplanar CT image reconstructions were also generated. RADIATION DOSE REDUCTION: This exam was performed according to the departmental dose-optimization program which includes automated exposure control, adjustment of the mA and/or kV according to patient size and/or use of iterative reconstruction technique. COMPARISON:  CT head and face today.  Cervical spine CT 04/06/2022. FINDINGS: Alignment: Chronic straightening but mildly increased reversal of cervical lordosis now. Cervicothoracic junction alignment is within normal limits. Mild chronic degenerative appearing anterolisthesis at both C3-C4 and C4-C5 with chronic facet degeneration. Bilateral posterior element alignment is within normal limits. Skull base and vertebrae: Bone mineralization is within normal limits. Visualized skull base is intact. No atlanto-occipital dissociation. C1 and C2 appear intact and aligned. No acute osseous abnormality identified. Soft tissues and spinal canal: No prevertebral fluid or swelling. No visible canal hematoma. Negative visible noncontrast neck soft tissues aside from carotid atherosclerosis. 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 Upper chest: Negative. IMPRESSION: 1. No acute traumatic injury identified in the cervical spine. 2. Stable chronic cervical spine  degeneration superimposed on degenerative C2-C3 facet ankylosis. Electronically Signed   By: VEAR Hurst M.D.   On: 02/21/2023 09:15   CT Maxillofacial Wo Contrast Result Date: 02/21/2023 CLINICAL DATA:  83 year old male status post fall yesterday, legs gave out. Abrasions. Blurred vision. EXAM: CT MAXILLOFACIAL WITHOUT CONTRAST TECHNIQUE: Multidetector CT imaging of the maxillofacial structures was performed. Multiplanar CT image reconstructions were also generated. RADIATION DOSE REDUCTION: This exam was performed according to the departmental dose-optimization program which includes automated exposure control, adjustment of the mA and/or kV according to patient size and/or use of iterative reconstruction technique. COMPARISON:  CT head and cervical spine today. Prior face CT 11/02/2022. FINDINGS: Osseous: Mandible intact and normally located. Stable dentition. Bilateral maxilla, zygoma, pterygoid and nasal bones appear stable and intact. Central skull base appears intact. Orbits: No acute orbital wall fracture. Chronic postoperative changes to the globes. No acute orbit soft tissue finding. Sinuses: Visualized paranasal sinuses and mastoids are stable and well aerated. Soft tissues: Negative visible noncontrast larynx, pharynx, parapharyngeal spaces, retropharyngeal space, sublingual space. Partial submandibular gland atrophy is stable. Masticator and parotid spaces appear negative. Calcified atherosclerosis. No soft tissue gas or superficial soft tissue injury identified other than the right forehead reported separately. Limited intracranial: Stable to that reported separately. IMPRESSION: No acute traumatic injury identified in the Face. Electronically Signed   By: VEAR Hurst M.D.   On: 02/21/2023 09:13   CT Head Wo Contrast Result Date: 02/21/2023 CLINICAL DATA:  83 year old male status post fall yesterday, legs gave out. Abrasions. Blurred vision. EXAM: CT HEAD WITHOUT CONTRAST TECHNIQUE: Contiguous axial  images were obtained from the base of the skull through the vertex without intravenous contrast. RADIATION DOSE REDUCTION: This exam was performed according to the departmental dose-optimization program  which includes automated exposure control, adjustment of the mA and/or kV according to patient size and/or use of iterative reconstruction technique. COMPARISON:  Brain MRI 01/18/2023 and earlier. Face and cervical spine CT today reported separately. FINDINGS: Brain: Stable cerebral volume. Stable basal ganglia vascular calcifications. No midline shift, mass effect, No midline shift, ventriculomegaly, mass effect, evidence of mass lesion, intracranial hemorrhage or evidence of cortically based acute infarction. Stable gray-white matter differentiation throughout the brain. Mild for age white matter hypodensity. Vascular: Calcified atherosclerosis at the skull base. No suspicious intracranial vascular hyperdensity. Skull: Stable.  No fracture identified. Sinuses/Orbits: Visualized paranasal sinuses and mastoids are stable and well aerated. Other: Mild right forehead scalp hematoma or contusion series 3, image 18. Underlying right frontal bone intact. No scalp soft tissue gas. IMPRESSION: 1. Mild right forehead scalp hematoma or contusion. No skull fracture. 2. No acute intracranial abnormality. Stable mild for age chronic cerebral white matter changes. Electronically Signed   By: VEAR Hurst M.D.   On: 02/21/2023 09:09   PHYSICAL EXAM  Temp:  [97.4 F (36.3 C)-99.2 F (37.3 C)] 98.3 F (36.8 C) (01/06 1157) Pulse Rate:  [71-80] 71 (01/06 1157) Resp:  [17-18] 17 (01/06 1157) BP: (111-142)/(53-67) 130/58 (01/06 1157) SpO2:  [93 %-96 %] 96 % (01/06 1157)  General - Well nourished, well developed, in no apparent distress.  Cardiovascular - Regular rhythm and rate.  Neuro: Mental Status: Patient is awake, alert, oriented to person, place, month, year, and situation. Patient is able to give a clear and  coherent history. No signs of aphasia or neglect Cranial Nerves: II: Visual Fields are full. Pupils are equal, round, and reactive to light.  Glasses at baseline III,IV, VI: EOMI without ptosis or diploplia.  V: Facial sensation is symmetric to light touch VII: Facial movement is symmetric.  VIII: hearing is intact to voice X: Uvula elevates symmetrically.  Phonation intact XI: Shoulder shrug is symmetric. XII: tongue is midline  Motor: Tone is normal. Bulk is normal.  BUE: No drift present, 5/5 RLE: very slight drift when compared to LLE LLE: no drift Sensory: Sensation is symmetric to light touch in the arms and legs. Cerebellar: FNF ataxic on RUE. HKS are intact bilaterally Gait: Deferred   ASSESSMENT/PLAN Harry Zamora is a 83 y.o. male with history of HTN, HLD, DM, prostate Ca, LBP admitted for fell at home, right sided weakness ataxia. TNK was given.    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 Left VA origin moderate stenosis MRI no acute infarct MRI C-spine cervical spinal cord edema/contusion at C3-5, concerning for contusion 2D Echo: LVEF 55 to 60% LDL 19 HgbA1c: 7.5 SCDs for VTE prophylaxis clopidogrel  75 mg daily prior to admission, now on ASA 81 with stable Hb. NSG consulted and recommend soft C-collar Ongoing aggressive stroke risk factor management Therapy recommendations:  CIR Disposition:  pending   Hx of hypertension Orthostatic hypotension Stable on the low end Orthostatic vitals - supine 122/59, sitting 85/49. standing 66/49 but asymptomaticd Encourage po intake Continue TED hose Continue midodrine  5mg  tid from 8am to 5pm Off IVF BP goal < 180/105 Long term BP goal normotensive  Acute anemia  Scalp laceration with prior bleeding Fall with scalp laceration -> bleeding after TNK  -> now stopped Hb 11.1->8.0->8.1->8.7->8.2->8.1->8.8->8.2->8.2->8.1 CT C/A/P x 2 - No acute intrathoracic,  intra-abdominal, intrapelvic traumatic injury. No pelvic sidewall or retroperitoneal hematoma. Asymmetry of the gluteus musculature inferiorly with asymmetric enlargement on the left - no hematoma on exam Continue CBC monitoring Put on ASA 81 today   Diabetes HgbA1c 7.5 goal < 7.0 Uncontrolled CBG monitoring SSI DM education and close PCP follow up  Hyperlipidemia Home meds:  crestor  40  LDL 19, goal < 70 Decreased to Crestor  20 Continue statin at discharge  AKI Cre 1.88--1.62--1.50--1.39--1.27--1.12 Encourage po intake BMP monitoring  Other Stroke Risk Factors Advanced age  Other Active Problems LBP Prostate cancer Hypokalemia K 3.4->3.3- supplement -> 4.0  Hospital day # 8  Patient seen and examined by NP/APP with MD. MD to update note as needed.   Harry Last, DNP, FNP-BC [facility] Pager: [phone]  ATTENDING NOTE: I reviewed above note and agree with the assessment and plan. Pt was seen and examined.   No acute event overnight. Pending CIR placement. Hb stable continue ASA 81 and Cre and K normalized. Encourage po intake.   For detailed assessment and plan, please refer to above/below as I have made changes wherever appropriate.   Harry Cummins, MD PhD Stroke Neurology 03/22/2023 3:29 PM    To contact Stroke Continuity provider, please refer to [email or web address]. After hours, contact General Neurology

## 2023-03-22 NOTE — Progress Notes (Signed)
 Physical Therapy Treatment Patient Details Name: Harry Zamora MRN: [id number] DOB: 05/08/1940 Today's Date: 03/22/2023   History of Present Illness 83 y.o. male presents to [facility] hospital on 03/14/2023 after a fall and with R weakness. Pt received TNK. CT negative, MRI negative. 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 PMH includes OA, depression, HTN, HLD, prostate cancer, DMII.    PT Comments  Pt resting in bed on arrival and agreeable to session with slow progress towards acute goals this session due to pt stated fatigue. Pt requiring grossly CGA for bed mobility, transfers and gait with RW for support. Pt continues to require cues for safety and RW use, pt with x1 mild posterior LOB in static standing without UE support with pt able to self correct. Pt continues to need cues throughout gait for closer RW proximity with pt able to correct but unable to maintain. Pt continues to benefit from skilled PT services to progress toward functional mobility goals.      If plan is discharge home, recommend the following: A little help with walking and/or transfers;A little help with bathing/dressing/bathroom;Assistance with cooking/housework;Supervision due to cognitive status;Help with stairs or ramp for entrance;Assist for transportation   Can travel by private vehicle        Equipment Recommendations  BSC/3in1    Recommendations for Other Services       Precautions / Restrictions Precautions Precautions: Fall;Other (comment) Precaution Comments: history of many falls Required Braces or Orthoses: Other Brace Other Brace: soft cervical collar for comfort Restrictions Weight Bearing Restrictions Per Provider Order: No     Mobility  Bed Mobility Overal bed mobility: Needs Assistance Bed Mobility: Supine to Sit, Sit to Supine     Supine to sit: Supervision, Used rails, HOB elevated Sit to supine:  Supervision   General bed mobility comments: supervision for safety with increased time to complete    Transfers Overall transfer level: Needs assistance Equipment used: Rolling walker (2 wheels) Transfers: Sit to/from Stand Sit to Stand: Contact guard assist           General transfer comment: CGA from EOB and low commode, some bracing of legs at EOB to stedy on rise and increased time to come to full upright standing    Ambulation/Gait Ambulation/Gait assistance: Contact guard assist Gait Distance (Feet): 12 Feet (+ 75) Assistive device: Rolling walker (2 wheels) Gait Pattern/deviations: Step-through pattern, Wide base of support, Decreased stride length Gait velocity: reduced     General Gait Details: pt ambulating to bathroom at start of session then to hall, CGA for safety, no overt LOB, cues for closer RW proximity and upright trunk, distance limited to pt stated fatigue,   Stairs             Wheelchair Mobility     Tilt Bed    Modified Rankin (Stroke Patients Only) Modified Rankin (Stroke Patients Only) Pre-Morbid Rankin Score: Moderate disability Modified Rankin: Moderately severe disability     Balance Overall balance assessment: Needs assistance Sitting-balance support: Bilateral upper extremity supported, Feet supported Sitting balance-Leahy Scale: Fair     Standing balance support: Bilateral upper extremity supported, During functional activity, Reliant on assistive device for balance Standing balance-Leahy Scale: Poor Standing balance comment: reliant on UE support during dynamic tasks                            Cognition  Arousal: Alert Behavior During Therapy: Flat affect Overall Cognitive Status: Impaired/Different from baseline Area of Impairment: Safety/judgement                         Safety/Judgement: Decreased awareness of safety Awareness: Emergent            Exercises      General Comments  General comments (skin integrity, edema, etc.): VSS      Pertinent Vitals/Pain Pain Assessment Pain Assessment: No/denies pain    Home Living                          Prior Function            PT Goals (current goals can now be found in the care plan section) Acute Rehab PT Goals Patient Stated Goal: to stop falling PT Goal Formulation: With patient Time For Goal Achievement: 03/28/23 Progress towards PT goals: Progressing toward goals    Frequency    Min 1X/week      PT Plan      Co-evaluation              AM-PAC PT 6 Clicks Mobility   Outcome Measure  Help needed turning from your back to your side while in a flat bed without using bedrails?: None Help needed moving from lying on your back to sitting on the side of a flat bed without using bedrails?: None Help needed moving to and from a bed to a chair (including a wheelchair)?: A Little Help needed standing up from a chair using your arms (e.g., wheelchair or bedside chair)?: A Little Help needed to walk in hospital room?: A Little Help needed climbing 3-5 steps with a railing? : A Lot 6 Click Score: 19    End of Session   Activity Tolerance: Patient tolerated treatment well;Patient limited by fatigue Patient left: with call bell/phone within reach;in bed;with bed alarm set Nurse Communication: Mobility status PT Visit Diagnosis: Other abnormalities of gait and mobility (R26.89);Muscle weakness (generalized) (M62.81);History of falling (Z91.81)     Time: 8475-8454 PT Time Calculation (min) (ACUTE ONLY): 21 min  Charges:    $Gait Training: 8-22 mins PT General Charges $$ ACUTE PT VISIT: 1 Visit                     Joely Losier R. PTA Acute Rehabilitation Services Office: [phone]   Therisa CHRISTELLA Boor 03/22/2023, 4:06 PM

## 2023-03-23 ENCOUNTER — Other Ambulatory Visit: Payer: Self-pay

## 2023-03-23 ENCOUNTER — Inpatient Hospital Stay (HOSPITAL_COMMUNITY)
Admission: AD | Admit: 2023-03-23 | Discharge: 2023-04-01 | DRG: 945 | Disposition: A | Discharge status: Home Health | Payer: Medicare HMO | Source: Intra-hospital | Attending: Physical Medicine and Rehabilitation | Admitting: Physical Medicine and Rehabilitation

## 2023-03-23 ENCOUNTER — Encounter (HOSPITAL_COMMUNITY): Payer: Self-pay | Admitting: Physical Medicine and Rehabilitation

## 2023-03-23 DIAGNOSIS — Z8249 Family history of ischemic heart disease and other diseases of the circulatory system: Secondary | ICD-10-CM

## 2023-03-23 DIAGNOSIS — Z7984 Long term (current) use of oral hypoglycemic drugs: Secondary | ICD-10-CM

## 2023-03-23 DIAGNOSIS — I2510 Atherosclerotic heart disease of native coronary artery without angina pectoris: Secondary | ICD-10-CM | POA: Diagnosis not present

## 2023-03-23 DIAGNOSIS — Z7982 Long term (current) use of aspirin: Secondary | ICD-10-CM

## 2023-03-23 DIAGNOSIS — Z8261 Family history of arthritis: Secondary | ICD-10-CM | POA: Diagnosis not present

## 2023-03-23 DIAGNOSIS — Z79899 Other long term (current) drug therapy: Secondary | ICD-10-CM | POA: Diagnosis not present

## 2023-03-23 DIAGNOSIS — Z8546 Personal history of malignant neoplasm of prostate: Secondary | ICD-10-CM

## 2023-03-23 DIAGNOSIS — Z7985 Long-term (current) use of injectable non-insulin antidiabetic drugs: Secondary | ICD-10-CM

## 2023-03-23 DIAGNOSIS — Z833 Family history of diabetes mellitus: Secondary | ICD-10-CM

## 2023-03-23 DIAGNOSIS — G4700 Insomnia, unspecified: Secondary | ICD-10-CM | POA: Diagnosis present

## 2023-03-23 DIAGNOSIS — D649 Anemia, unspecified: Secondary | ICD-10-CM | POA: Diagnosis not present

## 2023-03-23 DIAGNOSIS — R296 Repeated falls: Secondary | ICD-10-CM | POA: Diagnosis present

## 2023-03-23 DIAGNOSIS — M549 Dorsalgia, unspecified: Secondary | ICD-10-CM | POA: Diagnosis present

## 2023-03-23 DIAGNOSIS — E781 Pure hyperglyceridemia: Secondary | ICD-10-CM | POA: Diagnosis present

## 2023-03-23 DIAGNOSIS — W19XXXD Unspecified fall, subsequent encounter: Secondary | ICD-10-CM | POA: Diagnosis present

## 2023-03-23 DIAGNOSIS — I10 Essential (primary) hypertension: Secondary | ICD-10-CM | POA: Diagnosis present

## 2023-03-23 DIAGNOSIS — Z955 Presence of coronary angioplasty implant and graft: Secondary | ICD-10-CM | POA: Diagnosis not present

## 2023-03-23 DIAGNOSIS — K5900 Constipation, unspecified: Secondary | ICD-10-CM

## 2023-03-23 DIAGNOSIS — E1142 Type 2 diabetes mellitus with diabetic polyneuropathy: Secondary | ICD-10-CM | POA: Diagnosis present

## 2023-03-23 DIAGNOSIS — S14109A Unspecified injury at unspecified level of cervical spinal cord, initial encounter: Secondary | ICD-10-CM | POA: Diagnosis not present

## 2023-03-23 DIAGNOSIS — Z885 Allergy status to narcotic agent status: Secondary | ICD-10-CM

## 2023-03-23 DIAGNOSIS — Y92009 Unspecified place in unspecified non-institutional (private) residence as the place of occurrence of the external cause: Secondary | ICD-10-CM

## 2023-03-23 DIAGNOSIS — I951 Orthostatic hypotension: Secondary | ICD-10-CM | POA: Diagnosis not present

## 2023-03-23 DIAGNOSIS — F32A Depression, unspecified: Secondary | ICD-10-CM | POA: Diagnosis present

## 2023-03-23 DIAGNOSIS — G8929 Other chronic pain: Secondary | ICD-10-CM | POA: Diagnosis present

## 2023-03-23 DIAGNOSIS — Z7902 Long term (current) use of antithrombotics/antiplatelets: Secondary | ICD-10-CM

## 2023-03-23 DIAGNOSIS — R531 Weakness: Secondary | ICD-10-CM | POA: Diagnosis present

## 2023-03-23 DIAGNOSIS — K5901 Slow transit constipation: Secondary | ICD-10-CM | POA: Diagnosis not present

## 2023-03-23 DIAGNOSIS — Z981 Arthrodesis status: Secondary | ICD-10-CM | POA: Diagnosis not present

## 2023-03-23 DIAGNOSIS — Z791 Long term (current) use of non-steroidal anti-inflammatories (NSAID): Secondary | ICD-10-CM

## 2023-03-23 DIAGNOSIS — G2581 Restless legs syndrome: Secondary | ICD-10-CM | POA: Diagnosis not present

## 2023-03-23 DIAGNOSIS — S14103D Unspecified injury at C3 level of cervical spinal cord, subsequent encounter: Secondary | ICD-10-CM | POA: Diagnosis not present

## 2023-03-23 DIAGNOSIS — N179 Acute kidney failure, unspecified: Secondary | ICD-10-CM | POA: Diagnosis not present

## 2023-03-23 DIAGNOSIS — K592 Neurogenic bowel, not elsewhere classified: Secondary | ICD-10-CM | POA: Diagnosis not present

## 2023-03-23 DIAGNOSIS — N319 Neuromuscular dysfunction of bladder, unspecified: Secondary | ICD-10-CM | POA: Diagnosis present

## 2023-03-23 DIAGNOSIS — G894 Chronic pain syndrome: Secondary | ICD-10-CM | POA: Diagnosis not present

## 2023-03-23 DIAGNOSIS — Z794 Long term (current) use of insulin: Secondary | ICD-10-CM | POA: Diagnosis not present

## 2023-03-23 DIAGNOSIS — F17290 Nicotine dependence, other tobacco product, uncomplicated: Secondary | ICD-10-CM | POA: Diagnosis present

## 2023-03-23 DIAGNOSIS — Z888 Allergy status to other drugs, medicaments and biological substances status: Secondary | ICD-10-CM

## 2023-03-23 DIAGNOSIS — Z809 Family history of malignant neoplasm, unspecified: Secondary | ICD-10-CM

## 2023-03-23 DIAGNOSIS — Z881 Allergy status to other antibiotic agents status: Secondary | ICD-10-CM

## 2023-03-23 DIAGNOSIS — E1169 Type 2 diabetes mellitus with other specified complication: Secondary | ICD-10-CM | POA: Diagnosis not present

## 2023-03-23 LAB — BASIC METABOLIC PANEL
Anion gap: 10 (ref 5–15)
BUN: 16 mg/dL (ref 8–23)
CO2: 25 mmol/L (ref 22–32)
Calcium: 8.4 mg/dL — ABNORMAL LOW (ref 8.9–10.3)
Chloride: 101 mmol/L (ref 98–111)
Creatinine, Ser: 1.01 mg/dL (ref 0.61–1.24)
GFR, Estimated: >60 mL/min (ref >60)
Glucose, Bld: 181 mg/dL — ABNORMAL HIGH (ref 70–99)
Potassium: 3.6 mmol/L (ref 3.5–5.1)
Sodium: 136 mmol/L (ref 135–145)

## 2023-03-23 LAB — CBC
HCT: 24.5 % — ABNORMAL LOW (ref 39.0–52.0)
Hemoglobin: 8.2 g/dL — ABNORMAL LOW (ref 13.0–17.0)
MCH: 30.6 pg (ref 26.0–34.0)
MCHC: 33.5 g/dL (ref 30.0–36.0)
MCV: 91.4 fL (ref 80.0–100.0)
Platelets: 368 10*3/uL (ref 150–400)
RBC: 2.68 MIL/uL — ABNORMAL LOW (ref 4.22–5.81)
RDW: 13 % (ref 11.5–15.5)
WBC: 6.5 10*3/uL (ref 4.0–10.5)
nRBC: 0 % (ref 0.0–0.2)

## 2023-03-23 LAB — GLUCOSE, CAPILLARY
Glucose-Capillary: 190 mg/dL — ABNORMAL HIGH (ref 70–99)
Glucose-Capillary: 203 mg/dL — ABNORMAL HIGH (ref 70–99)
Glucose-Capillary: 187 mg/dL — ABNORMAL HIGH (ref 70–99)

## 2023-03-23 MED ORDER — ACETAMINOPHEN 325 MG PO TABS: 650.0000 mg | ORAL_TABLET | ORAL | Status: DC | PRN

## 2023-03-23 MED ORDER — PANTOPRAZOLE SODIUM 40 MG PO TBEC
40.0000 mg | DELAYED_RELEASE_TABLET | Freq: Every day | ORAL | Status: DC
Start: 2023-03-24 — End: 2023-04-01
  Administered 2023-03-24 – 2023-04-01 (×9): 40 mg via ORAL
  Filled 2023-03-23 (×9): qty 1

## 2023-03-23 MED ORDER — FERROUS SULFATE 325 (65 FE) MG PO TABS: 325.0000 mg | ORAL_TABLET | Freq: Every day | ORAL | Status: DC

## 2023-03-23 MED ORDER — ROPINIROLE HCL 1 MG PO TABS
4.0000 mg | ORAL_TABLET | Freq: Every day | ORAL | Status: DC
  Administered 2023-03-23 – 2023-03-31 (×9): 4 mg via ORAL
  Filled 2023-03-23 (×10): qty 4

## 2023-03-23 MED ORDER — INSULIN ASPART 100 UNIT/ML IJ SOLN
0.0000 [IU] | Freq: Three times a day (TID) | INTRAMUSCULAR | Status: DC
  Administered 2023-03-24: 2 [IU] via SUBCUTANEOUS
  Administered 2023-03-24: 3 [IU] via SUBCUTANEOUS
  Administered 2023-03-24 – 2023-03-25 (×2): 2 [IU] via SUBCUTANEOUS
  Administered 2023-03-25: 5 [IU] via SUBCUTANEOUS
  Administered 2023-03-26 – 2023-03-27 (×6): 2 [IU] via SUBCUTANEOUS
  Administered 2023-03-28 – 2023-03-31 (×7): 1 [IU] via SUBCUTANEOUS

## 2023-03-23 MED ORDER — FERROUS SULFATE 325 (65 FE) MG PO TABS
325.0000 mg | ORAL_TABLET | Freq: Every day | ORAL | Status: DC
Start: 2023-03-24 — End: 2023-04-01
  Administered 2023-03-24 – 2023-04-01 (×9): 325 mg via ORAL
  Filled 2023-03-23 (×9): qty 1

## 2023-03-23 MED ORDER — ASPIRIN 81 MG PO TBEC
81.0000 mg | DELAYED_RELEASE_TABLET | Freq: Every day | ORAL | Status: DC
  Administered 2023-03-24 – 2023-04-01 (×9): 81 mg via ORAL
  Filled 2023-03-23 (×9): qty 1

## 2023-03-23 MED ORDER — ASPIRIN 81 MG PO TBEC: 81.0000 mg | DELAYED_RELEASE_TABLET | Freq: Every day | ORAL | Status: DC

## 2023-03-23 MED ORDER — DULOXETINE HCL 30 MG PO CPEP
30.0000 mg | ORAL_CAPSULE | Freq: Every day | ORAL | Status: DC
Start: 2023-03-24 — End: 2023-04-01
  Administered 2023-03-24 – 2023-04-01 (×9): 30 mg via ORAL
  Filled 2023-03-23 (×9): qty 1

## 2023-03-23 MED ORDER — GUAIFENESIN 100 MG/5ML PO LIQD: 5.0000 mL | ORAL | Status: DC | PRN

## 2023-03-23 MED ORDER — VITAMIN B-12 1000 MCG PO TABS
1000.0000 ug | ORAL_TABLET | Freq: Every day | ORAL | Status: DC
  Administered 2023-03-24 – 2023-04-01 (×9): 1000 ug via ORAL
  Filled 2023-03-23 (×10): qty 1

## 2023-03-23 MED ORDER — SENNOSIDES-DOCUSATE SODIUM 8.6-50 MG PO TABS
1.0000 | ORAL_TABLET | Freq: Every evening | ORAL | Status: DC | PRN
Start: 2023-03-23 — End: 2023-03-28

## 2023-03-23 MED ORDER — ACETAMINOPHEN 160 MG/5ML PO SOLN: 650.0000 mg | ORAL | Status: DC | PRN

## 2023-03-23 MED ORDER — GABAPENTIN 100 MG PO CAPS
100.0000 mg | ORAL_CAPSULE | Freq: Every day | ORAL | Status: DC
  Administered 2023-03-23 – 2023-03-31 (×9): 100 mg via ORAL
  Filled 2023-03-23 (×9): qty 1

## 2023-03-23 MED ORDER — IPRATROPIUM-ALBUTEROL 0.5-2.5 (3) MG/3ML IN SOLN: 3.0000 mL | Freq: Four times a day (QID) | RESPIRATORY_TRACT | Status: DC | PRN

## 2023-03-23 MED ORDER — ACETAMINOPHEN 650 MG RE SUPP: 650.0000 mg | RECTAL | Status: DC | PRN

## 2023-03-23 MED ORDER — ROSUVASTATIN CALCIUM 20 MG PO TABS
20.0000 mg | ORAL_TABLET | Freq: Every day | ORAL | Status: DC
  Administered 2023-03-24 – 2023-04-01 (×9): 20 mg via ORAL
  Filled 2023-03-23 (×9): qty 1

## 2023-03-23 MED ORDER — DULOXETINE HCL 30 MG PO CPEP: 30.0000 mg | ORAL_CAPSULE | Freq: Every day | ORAL | Status: DC

## 2023-03-23 MED ORDER — MIDODRINE HCL 5 MG PO TABS: 5.0000 mg | ORAL_TABLET | Freq: Three times a day (TID) | ORAL | Status: DC

## 2023-03-23 MED ORDER — ROSUVASTATIN CALCIUM 20 MG PO TABS: 20.0000 mg | ORAL_TABLET | Freq: Every day | ORAL | Status: DC

## 2023-03-23 MED ORDER — MIDODRINE HCL 5 MG PO TABS
5.0000 mg | ORAL_TABLET | Freq: Three times a day (TID) | ORAL | Status: DC
  Administered 2023-03-24 – 2023-03-30 (×20): 5 mg via ORAL
  Filled 2023-03-23 (×20): qty 1

## 2023-03-23 NOTE — Plan of Care (Signed)
  Problem: Education: Goal: Knowledge of disease or condition will improve Outcome: Progressing Goal: Knowledge of patient specific risk factors will improve Harry Zamora N/A or DELETE if not current risk factor) Outcome: Progressing   Problem: Education: Goal: Knowledge of patient specific risk factors will improve Harry Zamora N/A or DELETE if not current risk factor) Outcome: Progressing   Problem: Nutrition: Goal: Risk of aspiration will decrease Outcome: Progressing

## 2023-03-23 NOTE — Discharge Instructions (Addendum)
 Inpatient Rehab Discharge Instructions  Harry Zamora [facility] Discharge date and time: No discharge date for patient encounter.   Activities/Precautions/ Functional Status: Activity: Soft cervical collar for comfort Diet: Diabetic diet Wound Care: Routine skin checks Functional status:  ___ No restrictions     ___ Walk up steps independently ___ 24/7 supervision/assistance   ___ Walk up steps with assistance ___ Intermittent supervision/assistance  ___ Bathe/dress independently ___ Walk with walker     _x__ Bathe/dress with assistance ___ Walk Independently    ___ Shower independently ___ Walk with assistance    ___ Shower with assistance ___ No alcohol     ___ Return to work/school ________  Special Instructions: No driving smoking or alcohol    COMMUNITY REFERRALS UPON DISCHARGE:    Home Health:   PT  &  OT                Agency:[facility] Phone: [phone]   Medical Equipment/Items Ordered:Harry Zamora                                                 Agency/Supplier:[facility] [phone]    My questions have been answered and I understand these instructions. I will adhere to these goals and the provided educational materials after my discharge from the hospital.  Patient/Caregiver Signature _______________________________ Date __________  Clinician Signature _______________________________________ Date __________  Please bring this form and your medication list with you to all your follow-up doctor's appointments.

## 2023-03-23 NOTE — TOC Transition Note (Signed)
 Transition of Care [facility]) - Discharge Note   Patient Details  Name: BRANDYN LOWREY MRN: [id number] Date of Birth: 19-Sep-1940  [facility] CM/SW Contact:  Andrez JULIANNA George, RN Phone Number: 03/23/2023, 11:15 AM   Clinical Narrative:     Pt is discharging to CIR today. CM signing off.  Final next level of care: IP Rehab Facility Barriers to Discharge: No Barriers Identified   Patient Goals and CMS Choice   CMS Medicare.gov Compare Post Acute Care list provided to:: Patient Represenative (must comment) Choice offered to / list presented to : Spouse      Discharge Placement                       Discharge Plan and Services Additional resources added to the After Visit Summary for     Discharge Planning Services: CM Consult Post Acute Care Choice: IP Rehab                               Social Drivers of Health (SDOH) Interventions SDOH Screenings   Food Insecurity: No Food Insecurity (03/15/2023)  Housing: Low Risk  (03/15/2023)  Transportation Needs: No Transportation Needs (03/15/2023)  Utilities: Not At Risk (03/15/2023)  Social Connections: Socially Integrated (03/15/2023)  Tobacco Use: High Risk (03/14/2023)     Readmission Risk Interventions     No data to display

## 2023-03-23 NOTE — Progress Notes (Signed)
 Inpatient Rehab Admissions Coordinator:   Left voicemail for [facility] case manager to inquire on status of prior auth request.    Estill Dooms, PT, DPT Admissions Coordinator [phone] 03/23/23  10:38 AM

## 2023-03-23 NOTE — Progress Notes (Signed)
 Cornelio Bouchard, MD  Physician Physical Medicine and Rehabilitation   PMR Pre-admission    Signed   Date of Service: 03/23/2023 11:44 AM  Related encounter: ED to Hosp-Admission (Discharged) from 03/14/2023 in [facility]   Signed     Expand All Collapse All  PMR Admission Coordinator Pre-Admission Assessment   Patient: Harry Zamora is an 83 y.o., male MRN: [id number] DOB: 1940-11-14 Height: 6' 2 (188 cm) Weight: 67.4 kg                                                                                                                                                  Insurance Information HMO: yes    PPO:      PCP:      IPA:      80/20:      OTHER:  PRIMARY: [employer]      Policy#: [id number]      Subscriber: pt CM Name: Leita FALCON      Phone#: [phone]     Fax#: [phone] Pre-Cert#: [id number] auth for CIR from Leita with [employer] with updates due to Norvel SQUIBB (ext [phone]) at fax listed above on 03/29/23      Employer:  Benefits:  Phone #: [phone]     Name:  Eff. Date: 03/17/23     Deduct: $0      Out of Pocket Max: $4000 ($0 met)      Life Max: n/a  CIR:  $160/day for days 1-10     SNF: 20 full days Outpatient:      Co-Pay: $20/visit Home Health: 100%      Co-Pay:  DME: 80%     Co-Pay: 20% Providers:  SECONDARY:       Policy#:       Phone#:    Artist:       Phone#:    [employer] Information Summary" for patients in Inpatient Rehabilitation Facilities with attached "Privacy Act Statement-Health Care Records" was provided and verbally reviewed with: Patient and Family   Emergency Contact Information Contact Information       Name Relation Home Work Mobile    Harry Zamora Spouse [phone]        Harry Zamora Daughter [phone]   [phone]    [facility] Other     [phone]    Rollin Czar Other [phone]             Other Contacts   None on File      Current Medical History  Patient  Admitting Diagnosis: 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  Per family report, pt has has an increased number of falls  in the last year.  Initially felt could be stroke so workup was initiated, pt given TNK. CT/MRI negative.  Hospital course notable for new O2 needs, new bowel/bladder incontinence, ABLA, hypotension, decline in mobility.  MRI spine revealed cord contusion at C3-5 with small volume epidural hematoma with potential PLL disruption at C2-3.  Neurosurgery was consulted and recommended conservative management with soft collar and f/u in 3 months for imaging.  Pt continues to require 2L O2 via nasal cannula.  Pt has been confused at times.  Therapy evaluations completed and pt was recommended for CIR.      Complete NIHSS TOTAL: 1 Glasgow Coma Scale Score: 15   Patient's medical record from [facility] has been reviewed by the rehabilitation admission coordinator and physician.   Past Medical History      Past Medical History:  Diagnosis Date   Anemia      as an infant   Arthritis     Back pain     Depression     Disc displacement, lumbar     Gynecomastia     History of kidney stones     Hyperlipidemia     Hypertension      no longer on medications   Hypertriglyceridemia     Insomnia     Low back pain     Lumbar radiculopathy     Lumbar stenosis     OA (osteoarthritis)     Polyneuropathy in diabetes(357.2)     Prostate cancer (HCC) 2003   Restless legs     Ringing in ears     RLS (restless legs syndrome)     Type 2 diabetes mellitus (HCC)            Has the patient had major surgery during 100 days prior to admission? No   Family History  family history includes Arthritis in his mother; Cancer in his father; Diabetes in his mother; Heart attack in his mother; Hypertension in his mother.     Current  Medications   Current Medications    Current Facility-Administered Medications:    acetaminophen  (TYLENOL ) tablet 650 mg, 650 mg, Oral, Q4H PRN, 650 mg at 03/19/23 0030 **OR** acetaminophen  (TYLENOL ) 160 MG/5ML solution 650 mg, 650 mg, Per Tube, Q4H PRN **OR** acetaminophen  (TYLENOL ) suppository 650 mg, 650 mg, Rectal, Q4H PRN, Arora, Ashish, MD   Chlorhexidine  Gluconate Cloth 2 % PADS 6 each, 6 each, Topical, Daily, Arora, Ashish, MD, 6 each at 03/16/23 1004   cyanocobalamin  (VITAMIN B12) tablet 1,000 mcg, 1,000 mcg, Oral, Daily, Jerri Pfeiffer, MD, 1,000 mcg at 03/19/23 9185   DULoxetine  (CYMBALTA ) DR capsule 30 mg, 30 mg, Oral, Daily, Jerri Pfeiffer, MD, 30 mg at 03/19/23 9185   ferrous sulfate  tablet 325 mg, 325 mg, Oral, Q breakfast, Jerri Pfeiffer, MD, 325 mg at 03/19/23 9185   gabapentin  (NEURONTIN ) capsule 100 mg, 100 mg, Oral, QHS, Jerri Pfeiffer, MD, 100 mg at 03/18/23 2153   guaiFENesin  (ROBITUSSIN) 100 MG/5ML liquid 5 mL, 5 mL, Oral, Q4H PRN, Bhagat, Srishti L, MD, 5 mL at 03/18/23 2153   insulin  aspart (novoLOG ) injection 0-9 Units, 0-9 Units, Subcutaneous, TID WC, Jerri Pfeiffer, MD, 2 Units at 03/19/23 1127   ipratropium-albuterol  (DUONEB) 0.5-2.5 (3) MG/3ML nebulizer solution 3 mL, 3 mL, Nebulization, Q6H PRN, Bhagat, Srishti L, MD, 3 mL at 03/16/23 9485   lidocaine -EPINEPHrine -tetracaine  (LET) topical gel, 3 mL, Topical, Q12H PRN, Arora, Ashish, MD   midodrine  (PROAMATINE ) tablet 5 mg, 5 mg, Oral, TID WC, Xu,  Jindong, MD, 5 mg at 03/19/23 1631   ondansetron  (ZOFRAN ) injection 4 mg, 4 mg, Intravenous, Q6H PRN, Arora, Ashish, MD   Oral care mouth rinse, 15 mL, Mouth Rinse, PRN, Arora, Ashish, MD   pantoprazole  (PROTONIX ) EC tablet 40 mg, 40 mg, Oral, Daily, Jerri Pfeiffer, MD, 40 mg at 03/19/23 9185   rOPINIRole  (REQUIP ) tablet 4 mg, 4 mg, Oral, QHS, Jerri Pfeiffer, MD, 4 mg at 03/18/23 2153   rosuvastatin  (CRESTOR ) tablet 20 mg, 20 mg, Oral, Daily, Jerri Pfeiffer, MD, 20 mg at 03/19/23 9185    senna-docusate (Senokot-S) tablet 1 tablet, 1 tablet, Oral, QHS PRN, Arora, Ashish, MD, 1 tablet at 03/18/23 0905     Patients Current Diet:  Diet Order                  Diet heart healthy/carb modified Room service appropriate? Yes with Assist; Fluid consistency: Thin  Diet effective now                         Precautions / Restrictions Precautions Precautions: Fall, Other (comment) Precaution Comments: history of many falls Other Brace: soft cervical collar for comfort Restrictions Weight Bearing Restrictions Per Provider Order: No    Has the patient had 2 or more falls or a fall with injury in the past year?Yes   Prior Activity Level Limited Community (1-2x/wk): independent with SPC out of the home, no DME in the home, indep with ADLs, per wife has been recommended not to drive by several physicians but continues to do so.   Prior Functional Level Prior Function Prior Level of Function : Independent/Modified Independent Mobility Comments: ambulatory with SPC vs rollator ADLs Comments: no assistance. per 2023 chart was driving. pt verbalized in session he drives   Self Care: Did the patient need help bathing, dressing, using the toilet or eating?  Independent   Indoor Mobility: Did the patient need assistance with walking from room to room (with or without device)? Independent   Stairs: Did the patient need assistance with internal or external stairs (with or without device)? Independent   Functional Cognition: Did the patient need help planning regular tasks such as shopping or remembering to take medications? Independent   Patient Information Are you of Hispanic, Latino/a,or Spanish origin?: A. No, not of Hispanic, Latino/a, or Spanish origin What is your race?: A. White Do you need or want an interpreter to communicate with a doctor or health care staff?: 0. No   Patient's Response To:  Health Literacy and Transportation Is the patient able to respond to  health literacy and transportation needs?: Yes Health Literacy - How often do you need to have someone help you when you read instructions, pamphlets, or other written material from your doctor or pharmacy?: Never In the past 12 months, has lack of transportation kept you from medical appointments or from getting medications?: No In the past 12 months, has lack of transportation kept you from meetings, work, or from getting things needed for daily living?: No   Journalist, Newspaper / Equipment Home Equipment: Medical Laboratory Scientific Officer - single point, Rollator (4 wheels), Shower seat, Grab bars - tub/shower   Prior Device Use: Indicate devices/aids used by the patient prior to current illness, exacerbation or injury?  cane   Current Functional Level Cognition   Arousal/Alertness: Awake/alert Overall Cognitive Status: Impaired/Different from baseline Orientation Level: Oriented X4 Safety/Judgement: Decreased awareness of deficits General Comments: pt continues to have no awareness of bowels,  Extremity Assessment (includes Sensation/Coordination)   Upper Extremity Assessment: Right hand dominant, RUE deficits/detail RUE Deficits / Details: pt with decreased R UE flexion to 90 degrees with palm externally rotating. pt with ataxic movement with decreased nose to finger undershooting. pt demonstrates 3+ out of 5 RUE Coordination: decreased fine motor, decreased gross motor  Lower Extremity Assessment: RLE deficits/detail RLE Deficits / Details: noted to have decreased strength compared to the L     ADLs         Mobility   Overal bed mobility: Needs Assistance Bed Mobility: Supine to Sit, Sit to Supine Rolling: Min assist, Used rails Supine to sit: Supervision, Used rails, HOB elevated Sit to supine: Supervision General bed mobility comments: supervision for safety with increased time to complete     Transfers   Overall transfer level: Needs assistance Equipment used: Rolling walker (2  wheels) Transfers: Bed to chair/wheelchair/BSC Sit to Stand: Contact guard assist, Min assist Bed to/from chair/wheelchair/BSC transfer type:: Step pivot Step pivot transfers: Mod assist General transfer comment: min A initially to facilitate anterior momentum, CGA for safety for subsequent stands x3 from EOB     Ambulation / Gait / Stairs / Wheelchair Mobility   Ambulation/Gait Ambulation/Gait assistance: Contact guard assist Gait Distance (Feet): 190 Feet (+ 80) Assistive device: Rolling walker (2 wheels) Gait Pattern/deviations: Step-through pattern, Wide base of support, Decreased stride length General Gait Details: CGA for safety, no overt LOB, cues for closer RW proximity and upright trunk Gait velocity: reduced Gait velocity interpretation: <1.8 ft/sec, indicate of risk for recurrent falls     Posture / Balance Balance Overall balance assessment: Needs assistance Sitting-balance support: Bilateral upper extremity supported, Feet supported Sitting balance-Leahy Scale: Fair Standing balance support: Bilateral upper extremity supported, During functional activity, Reliant on assistive device for balance Standing balance-Leahy Scale: Poor Standing balance comment: reliant on UE support during dynamic tasks     Special needs/care consideration Oxygen 2L acutely, Skin scalp lac, and Diabetic management yes        Previous Home Environment (from acute therapy documentation) Living Arrangements: Spouse/significant other  Lives With: Spouse Available Help at Discharge: Family, Available PRN/intermittently Type of Home: House Home Layout: One level Home Access: Stairs to enter Entrance Stairs-Rails: None Entrance Stairs-Number of Steps: threshhold Bathroom Shower/Tub: Health Visitor: Standard Home Care Services: No Additional Comments: has a 84yo old rat terrier named Maggie that he adores   Discharge Living Setting Plans for Discharge Living Setting: Patient's  home, Lives with (comment) (spouse) Type of Home at Discharge: House Discharge Home Layout: One level Discharge Home Access: Level entry Discharge Bathroom Shower/Tub: Walk-in shower Discharge Bathroom Toilet: Standard Discharge Bathroom Accessibility: No Does the patient have any problems obtaining your medications?: No   Social/Family/Support Systems Patient Roles: Spouse Anticipated Caregiver: Emannuel Vise (spouse) Anticipated Caregiver's Contact Information: [phone] Ability/Limitations of Caregiver: supervision only Caregiver Availability: 24/7 Discharge Plan Discussed with Primary Caregiver: Yes Is Caregiver In Agreement with Plan?: Yes Does Caregiver/Family have Issues with Lodging/Transportation while Pt is in Rehab?: No     Goals Patient/Family Goal for Rehab: PT/OT/SLP supervision Expected length of stay: 9-12 days Additional Information: Discharge plan: home with spouse who can provide 24/7 supervision Pt/Family Agrees to Admission and willing to participate: Yes Program Orientation Provided & Reviewed with Pt/Caregiver Including Roles  & Responsibilities: Yes     Decrease burden of Care through IP rehab admission: n/a     Possible need for SNF placement upon discharge: Not anticipated.  Plan d/c home with spouse 24/7 supervision.      Patient Condition: This patient's condition remains as documented in the consult dated 03/17/22, in which the Rehabilitation Physician determined and documented that the patient's condition is appropriate for intensive rehabilitative care in an inpatient rehabilitation facility. Will admit to inpatient rehab today.   Preadmission Screen Completed By:  Reche FORBES Lowers, PT, DPT 03/19/2023 4:36 PM ______________________________________________________________________   Discussed status with Dr. Lovornon 03/23/23 at 11:44 AM  and received approval for admission today.   Admission Coordinator:  Jamian Andujo E Ed Rayson, PT, DPT time 11:44 AM  Pattricia 03/23/23              Revision History

## 2023-03-23 NOTE — H&P (Signed)
 Physical Medicine and Rehabilitation Admission H&P        Chief Complaint  Patient presents with   Code Stroke  : HPI: Harry Zamora is a 83 year old right-handed male with history of chronic anemia, chronic back pain status post anterior lateral lumbar fusion with radiculopathy, depression/insomnia, chronic orthostatic hypotension followed by [facility] neurology with frequent falls, hyperlipidemia, prostate cancer, CAD with stenting maintained on Plavix , type 2 diabetes mellitus with neuropathy, restless legs, patient does smoke cigars.  Per chart review patient lives with spouse.  1 level home.  Independent and driving prior to admission.  Presented 03/14/2023 after a fall while returning from the bathroom when he struck his head had a small laceration on the back of his head.  Denied loss of consciousness.  Initial cranial CT scan showed no acute intracranial process.  Left occipital scalp hematoma.  CT cervical spine negative however MRI cervical spine showed cord edema/contusion at C3-5 and was placed in a cervical soft collar per neurosurgery..  CTA of head and neck showed no intracranial large vessel occlusion.  Demonstrated severe stenosis of the right PCA near the P1-P2 junction with the remainder of the right PCA being diminutive and poorly opacified.  Severe stenosis in the proximal left P2 with multifocal irregularity in the remainder of the left P2.  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  Moderate stenosis of the origin of the left vertebral artery and mild stenosis of the origin of the right vertebral artery.  CT of the chest abdomen and pelvis showed no acute intrathoracic intra-abdominal or intrapelvic traumatic injury.  Patient did receive TNK.  MRI of the brain follow-up showed no acute intracranial abnormalities finding of small vessel ischemia as well as noted posterior scalp subgaleal hematoma.  Admission chemistries were  unremarkable except creatinine of 1.34, WBC 13,700, lactic acid 1.7, alcohol negative.  Patient was cleared for low-dose aspirin  81 mg but chronic Plavix  has been discontinued.  Hospital course monitoring of blood pressure maintained on ProAmatine .  Tolerating a regular consistency diet.  Therapy evaluations completed due to patient decreased functional mobility was admitted for a comprehensive rehab program.     Pt reports no more bowel incontinence that was having initially-  Now continent and can feel/control BM's- LBM 2 days ago Also reports voiding well- with control and no accidents.  Feeling stronger.  No cold or flu Sx's anymore- feels good resp wise   Has some back pain- interimittent, but no back pain right now.      Review of Systems  Constitutional:  Negative for chills and fever.  HENT:  Positive for tinnitus.   Eyes:  Negative for blurred vision and double vision.  Respiratory:  Negative for cough, hemoptysis, sputum production, shortness of breath and wheezing.   Cardiovascular:  Negative for chest pain, palpitations and leg swelling.  Gastrointestinal:  Positive for constipation. Negative for heartburn, nausea and vomiting.  Genitourinary:  Positive for urgency. Negative for dysuria, flank pain and hematuria.  Musculoskeletal:  Positive for back pain and falls.  Skin:  Negative for rash.  Neurological:  Positive for sensory change and weakness.       Restless leg syndrome  Psychiatric/Behavioral:  Positive for depression. The patient has insomnia.   All other systems reviewed and are negative.       Past Medical History:  Diagnosis Date   Anemia      as an infant   Arthritis  Back pain     Depression     Disc displacement, lumbar     Gynecomastia     History of kidney stones     Hyperlipidemia     Hypertension      no longer on medications   Hypertriglyceridemia     Insomnia     Low back pain     Lumbar radiculopathy     Lumbar stenosis     OA  (osteoarthritis)     Polyneuropathy in diabetes(357.2)     Prostate cancer (HCC) 2003   Restless legs     Ringing in ears     RLS (restless legs syndrome)     Type 2 diabetes mellitus (HCC)               Past Surgical History:  Procedure Laterality Date   ABDOMINAL EXPOSURE N/A 07/13/2019    Procedure: ABDOMINAL EXPOSURE;  Surgeon: Serene Gaile ORN, MD;  Location: [facility] OR;  Service: Vascular;  Laterality: N/A;  anterior approach   ANTERIOR LAT LUMBAR FUSION Right 07/13/2019    Procedure: Right Lumbar 3-4 Lumbar 4-5 Anterolateral lumbar interbody fusion;  Surgeon: Unice Pac, MD;  Location: [facility] OR;  Service: Neurosurgery;  Laterality: Right;   ANTERIOR LUMBAR FUSION N/A 07/13/2019    Procedure: Lumbar Five-Sacral One Anterior lumbar interbody fusion;  Surgeon: Unice Pac, MD;  Location: [facility] OR;  Service: Neurosurgery;  Laterality: N/A;  Anterior approach   APPLICATION OF INTRAOPERATIVE CT SCAN N/A 04/24/2021    Procedure: APPLICATION OF INTRAOPERATIVE CT SCAN;  Surgeon: Dawley, Lani BROCKS, DO;  Location: [facility] OR;  Service: Neurosurgery;  Laterality: N/A;  3C/RM 21   CHOLECYSTECTOMY       COLONOSCOPY       CORONARY STENT INTERVENTION N/A 10/01/2020    Procedure: CORONARY STENT INTERVENTION;  Surgeon: Elmira Newman PARAS, MD;  Location: [facility] INVASIVE CV LAB;  Service: Cardiovascular;  Laterality: N/A;   EYE SURGERY Bilateral     GALLBLADDER SURGERY       HEMORRHOID SURGERY   1983   LEFT HEART CATH AND CORONARY ANGIOGRAPHY N/A 09/17/2020    Procedure: LEFT HEART CATH AND CORONARY ANGIOGRAPHY;  Surgeon: Elmira Newman PARAS, MD;  Location: [facility] INVASIVE CV LAB;  Service: Cardiovascular;  Laterality: N/A;   LUMBAR LAMINECTOMY/DECOMPRESSION MICRODISCECTOMY N/A 10/02/2016    Procedure: L3 to S1 Laminectomy;  Surgeon: Unice Pac, MD;  Location: [facility] OR;  Service: Neurosurgery;  Laterality: N/A;  L3 to S1 Laminectomy   LUMBAR LAMINECTOMY/DECOMPRESSION MICRODISCECTOMY Bilateral 09/07/2019    Procedure:  Bilateral Lumbar Five - Sacral One Foraminotomy;  Surgeon: Unice Pac, MD;  Location: [facility] OR;  Service: Neurosurgery;  Laterality: Bilateral;  posterior   LUMBAR PERCUTANEOUS PEDICLE SCREW 3 LEVEL N/A 07/13/2019    Procedure: Percutaneous pedicle screw fixation from Lumbar 3 to Sacral 1;  Surgeon: Unice Pac, MD;  Location: [facility] OR;  Service: Neurosurgery;  Laterality: N/A;   MENISCUS REPAIR Right     PROSTATE SURGERY       SHOULDER SURGERY Left 01/2011    rotator cuff             Family History  Problem Relation Age of Onset   Arthritis Mother     Hypertension Mother     Diabetes Mother     Heart attack Mother     Cancer Father          Social History:  reports that he has been smoking cigars. He has never used smokeless tobacco.  He reports that he does not currently use alcohol. He reports that he does not use drugs. Allergies:  Allergies       Allergies  Allergen Reactions   Atorvastatin Other (See Comments)   Niacin        Other Reaction(s): break out in hives   Levaquin [Levofloxacin In D5w] Rash   Niaspan [Niacin Er (Antihyperlipidemic)] Rash and Other (See Comments)      Flushing reaction   Tramadol Rash            Medications Prior to Admission  Medication Sig Dispense Refill   ascorbic acid  (VITAMIN C ) 500 MG tablet Take 500 mg by mouth daily.       Calcium  Carb-Cholecalciferol  (CALCIUM  1000 + D PO) Take by mouth.       clopidogrel  (PLAVIX ) 75 MG tablet Take 1 tablet (75 mg total) by mouth daily. **Restart Plavix  on 04/29/2021** 90 tablet 3   cyanocobalamin  1000 MCG tablet Take 1,000 mcg by mouth daily.       Dulaglutide  (TRULICITY ) 3 MG/0.5ML SOAJ Inject 3 mg into the skin once a week. (Patient taking differently: Inject 3 mg into the skin once a week. Inject on Wednesday) 12 mL 3   DULoxetine  (CYMBALTA ) 30 MG capsule Take 30 mg by mouth daily.       ferrous sulfate  325 (65 FE) MG EC tablet Take 325 mg by mouth daily with breakfast.       gabapentin   (NEURONTIN ) 100 MG capsule Take 100 mg by mouth at bedtime.       insulin  glargine-yfgn (SEMGLEE) 100 UNIT/ML injection Inject 18 Units into the skin daily.       Krill Oil 500 MG CAPS Take 1 capsule by mouth daily.       Lysine 500 MG TABS Take 1 tablet by mouth daily.       meloxicam  (MOBIC ) 15 MG tablet Take 15 mg by mouth daily.       metFORMIN  (GLUCOPHAGE ) 500 MG tablet Take 2 tablets (1,000 mg total) by mouth 2 (two) times daily with a meal. Resume 10/03/2020   1   Multiple Vitamins-Minerals (MENS 50+ MULTI VITAMIN/MIN) TABS Take 1 tablet by mouth daily.       nitroGLYCERIN  (NITROSTAT ) 0.4 MG SL tablet Place 1 tablet (0.4 mg total) under the tongue every 5 (five) minutes as needed for chest pain. 30 tablet 3   prazosin  (MINIPRESS ) 1 MG capsule Take 1 mg by mouth at bedtime.       rOPINIRole  (REQUIP ) 4 MG tablet Take 4 mg by mouth at bedtime.       rosuvastatin  (CRESTOR ) 40 MG tablet TAKE 1 TABLET(40 MG) BY MOUTH DAILY 90 tablet 3   sildenafil (VIAGRA) 25 MG tablet Take by mouth.       TRESIBA FLEXTOUCH 100 UNIT/ML FlexTouch Pen daily. Inject on Wednesday                  Home: Home Living Family/patient expects to be discharged to:: Private residence Living Arrangements: Spouse/significant other Available Help at Discharge: Family, Available PRN/intermittently Type of Home: House Home Access: Stairs to enter Secretary/administrator of Steps: threshhold Entrance Stairs-Rails: None Home Layout: One level Bathroom Shower/Tub: Health Visitor: Standard Home Equipment: Medical Laboratory Scientific Officer - single point, Rollator (4 wheels), Shower seat, Grab bars - tub/shower Additional Comments: has a 84yo old rat terrier named Maggie that he adores  Lives With: Spouse   Functional History: Prior Function Prior Level of Function :  Independent/Modified Independent Mobility Comments: ambulatory with SPC vs rollator ADLs Comments: no assistance. per 2023 chart was driving. pt verbalized in session  he drives   Functional Status:  Mobility: Bed Mobility Overal bed mobility: Needs Assistance Bed Mobility: Supine to Sit, Sit to Supine Rolling: Min assist, Used rails Supine to sit: Supervision, Used rails, HOB elevated Sit to supine: Supervision General bed mobility comments: supervision for safety with increased time to complete Transfers Overall transfer level: Needs assistance Equipment used: Rolling walker (2 wheels) Transfers: Sit to/from Stand Sit to Stand: Contact guard assist Bed to/from chair/wheelchair/BSC transfer type:: Step pivot Step pivot transfers: Mod assist General transfer comment: CGA from EOB and low commode, some bracing of legs at EOB to stedy on rise and increased time to come to full upright standing Ambulation/Gait Ambulation/Gait assistance: Contact guard assist Gait Distance (Feet): 12 Feet (+ 75) Assistive device: Rolling walker (2 wheels) Gait Pattern/deviations: Step-through pattern, Wide base of support, Decreased stride length General Gait Details: pt ambulating to bathroom at start of session then to hall, CGA for safety, no overt LOB, cues for closer RW proximity and upright trunk, distance limited to pt stated fatigue, Gait velocity: reduced Gait velocity interpretation: <1.8 ft/sec, indicate of risk for recurrent falls   ADL: ADL Grooming: Wash/dry face, Standing, Contact guard assist Upper Body Dressing : Sitting, Set up Lower Body Dressing: Sit to/from stand, Minimal assistance Lower Body Dressing Details (indicate cue type and reason): doffed/donned bilat socks with setup and increased time. Min A to don pants with STS Toilet Transfer: Contact guard assist, Rolling walker (2 wheels), Ambulation Functional mobility during ADLs: Contact guard assist, Rolling walker (2 wheels) General ADL Comments: During sink ADLs pt noted to have loose stool dropping onto ground. Immediately went to position pt on toilet and proceed with cleaning pt and  getting him new garments   Cognition: Cognition Overall Cognitive Status: Impaired/Different from baseline Arousal/Alertness: Awake/alert Orientation Level: Oriented X4 Cognition Arousal: Alert Behavior During Therapy: Flat affect Overall Cognitive Status: Impaired/Different from baseline Area of Impairment: Safety/judgement Safety/Judgement: Decreased awareness of safety Awareness: Emergent General Comments: pt continues to have no awareness of bowels,   Physical Exam: Blood pressure (!) 123/57, pulse 72, temperature 97.8 F (36.6 C), temperature source Oral, resp. rate 18, height 6' 2 (1.88 m), weight 67.4 kg, SpO2 91%. Physical Exam Vitals and nursing note reviewed.  Constitutional:      General: He is not in acute distress.    Appearance: Normal appearance. He is normal weight.     Comments: Pt awake, alert, appropriate, sitting up in bed watching TV, NAD  HENT:     Head: Normocephalic and atraumatic.     Comments: Eczema on face/forehead; and top of head; abrasion on top of nose     Right Ear: External ear normal.     Left Ear: External ear normal.     Nose: Nose normal. No congestion.     Comments: Abrasion- dark red    Mouth/Throat:     Mouth: Mucous membranes are dry.     Pharynx: Oropharynx is clear. No oropharyngeal exudate.  Eyes:     General:        Right eye: No discharge.        Left eye: No discharge.     Extraocular Movements: Extraocular movements intact.  Cardiovascular:     Rate and Rhythm: Normal rate and regular rhythm.     Heart sounds: Normal heart sounds. No murmur heard.  No gallop.  Pulmonary:     Effort: Pulmonary effort is normal. No respiratory distress.     Breath sounds: Normal breath sounds. No wheezing, rhonchi or rales.  Abdominal:     General: Bowel sounds are normal. There is no distension.     Palpations: Abdomen is soft.     Tenderness: There is no abdominal tenderness.  Musculoskeletal:     Cervical back: Neck supple. No  tenderness.     Comments: RUE- deltoid 4+/5; Biceps 4+/5; Triceps 4/5; WE 4+/5; Grip 5-/5 and FA 4+/5 LUE 5/5 in same muscles RLE- 5-/5 throughout LLE- 5/5   Skin:    General: Skin is warm and dry.  Neurological:     Comments: Patient is awake alert follows commands.  Oriented to person place and time. No clonus; no hoffmans and no increased tone in Ue's and LE's.   Psychiatric:        Behavior: Behavior normal.     Comments: A little flat affect- doesn't speak much        Lab Results Last 48 Hours        Results for orders placed or performed during the hospital encounter of 03/14/23 (from the past 48 hours)  Glucose, capillary     Status: Abnormal    Collection Time: 03/21/23  6:24 AM  Result Value Ref Range    Glucose-Capillary 140 (H) 70 - 99 mg/dL      Comment: Glucose reference range applies only to samples taken after fasting for at least 8 hours.  Basic metabolic panel     Status: Abnormal    Collection Time: 03/21/23  6:43 AM  Result Value Ref Range    Sodium 136 135 - 145 mmol/L    Potassium 3.3 (L) 3.5 - 5.1 mmol/L    Chloride 99 98 - 111 mmol/L    CO2 25 22 - 32 mmol/L    Glucose, Bld 152 (H) 70 - 99 mg/dL      Comment: Glucose reference range applies only to samples taken after fasting for at least 8 hours.    BUN 28 (H) 8 - 23 mg/dL    Creatinine, Ser 8.72 (H) 0.61 - 1.24 mg/dL    Calcium  8.5 (L) 8.9 - 10.3 mg/dL    GFR, Estimated 56 (L) >60 mL/min      Comment: (NOTE) Calculated using the CKD-EPI Creatinine Equation (2021)      Anion gap 12 5 - 15      Comment: Performed at [facility], [address] [place], [place] [address]  CBC     Status: Abnormal    Collection Time: 03/21/23  6:43 AM  Result Value Ref Range    WBC 8.8 4.0 - 10.5 K/uL    RBC 2.68 (L) 4.22 - 5.81 MIL/uL    Hemoglobin 8.2 (L) 13.0 - 17.0 g/dL    HCT 75.4 (L) 60.9 - 52.0 %    MCV 91.4 80.0 - 100.0 fL    MCH 30.6 26.0 - 34.0 pg    MCHC 33.5 30.0 - 36.0 g/dL    RDW 86.9  88.4 - 84.4 %    Platelets 301 150 - 400 K/uL    nRBC 0.0 0.0 - 0.2 %      Comment: Performed at [facility], [address], [place], [place] [address]  Glucose, capillary     Status: Abnormal    Collection Time: 03/21/23  8:23 AM  Result Value Ref Range  Glucose-Capillary 163 (H) 70 - 99 mg/dL      Comment: Glucose reference range applies only to samples taken after fasting for at least 8 hours.  Glucose, capillary     Status: Abnormal    Collection Time: 03/21/23 11:11 AM  Result Value Ref Range    Glucose-Capillary 139 (H) 70 - 99 mg/dL      Comment: Glucose reference range applies only to samples taken after fasting for at least 8 hours.    Comment 1 Notify RN      Comment 2 Document in Chart    Urinalysis, Complete w Microscopic -Urine, Clean Catch     Status: Abnormal    Collection Time: 03/21/23 11:20 AM  Result Value Ref Range    Color, Urine YELLOW YELLOW    APPearance HAZY (A) CLEAR    Specific Gravity, Urine 1.017 1.005 - 1.030    pH 5.0 5.0 - 8.0    Glucose, UA NEGATIVE NEGATIVE mg/dL    Hgb urine dipstick SMALL (A) NEGATIVE    Bilirubin Urine NEGATIVE NEGATIVE    Ketones, ur 5 (A) NEGATIVE mg/dL    Protein, ur 30 (A) NEGATIVE mg/dL    Nitrite NEGATIVE NEGATIVE    Leukocytes,Ua NEGATIVE NEGATIVE    RBC / HPF 0-5 0 - 5 RBC/hpf    WBC, UA 0-5 0 - 5 WBC/hpf    Bacteria, UA RARE (A) NONE SEEN    Squamous Epithelial / HPF 0-5 0 - 5 /HPF    Mucus PRESENT      Hyaline Casts, UA PRESENT        Comment: Performed at [facility], [address], [place], [place] [address]  Glucose, capillary     Status: Abnormal    Collection Time: 03/21/23  4:13 PM  Result Value Ref Range    Glucose-Capillary 156 (H) 70 - 99 mg/dL      Comment: Glucose reference range applies only to samples taken after fasting for at least 8 hours.    Comment 1 Notify RN      Comment 2 Document in Chart    Glucose, capillary     Status: Abnormal    Collection Time: 03/21/23  9:30 PM   Result Value Ref Range    Glucose-Capillary 176 (H) 70 - 99 mg/dL      Comment: Glucose reference range applies only to samples taken after fasting for at least 8 hours.  Basic metabolic panel     Status: Abnormal    Collection Time: 03/22/23  6:10 AM  Result Value Ref Range    Sodium 135 135 - 145 mmol/L    Potassium 4.0 3.5 - 5.1 mmol/L    Chloride 100 98 - 111 mmol/L    CO2 25 22 - 32 mmol/L    Glucose, Bld 166 (H) 70 - 99 mg/dL      Comment: Glucose reference range applies only to samples taken after fasting for at least 8 hours.    BUN 22 8 - 23 mg/dL    Creatinine, Ser 8.87 0.61 - 1.24 mg/dL    Calcium  8.3 (L) 8.9 - 10.3 mg/dL    GFR, Estimated >39 >39 mL/min      Comment: (NOTE) Calculated using the CKD-EPI Creatinine Equation (2021)      Anion gap 10 5 - 15      Comment: Performed at [facility], [address], [place], [place] [address]  CBC     Status: Abnormal  Collection Time: 03/22/23  6:10 AM  Result Value Ref Range    WBC 7.5 4.0 - 10.5 K/uL    RBC 2.61 (L) 4.22 - 5.81 MIL/uL    Hemoglobin 8.1 (L) 13.0 - 17.0 g/dL    HCT 75.7 (L) 60.9 - 52.0 %    MCV 92.7 80.0 - 100.0 fL    MCH 31.0 26.0 - 34.0 pg    MCHC 33.5 30.0 - 36.0 g/dL    RDW 86.8 88.4 - 84.4 %    Platelets 315 150 - 400 K/uL    nRBC 0.0 0.0 - 0.2 %      Comment: Performed at [facility], [address] [place], [place] [address]  Glucose, capillary     Status: Abnormal    Collection Time: 03/22/23  6:13 AM  Result Value Ref Range    Glucose-Capillary 183 (H) 70 - 99 mg/dL      Comment: Glucose reference range applies only to samples taken after fasting for at least 8 hours.  Glucose, capillary     Status: Abnormal    Collection Time: 03/22/23 11:58 AM  Result Value Ref Range    Glucose-Capillary 197 (H) 70 - 99 mg/dL      Comment: Glucose reference range applies only to samples taken after fasting for at least 8 hours.    Comment 1 Notify RN      Comment 2 Document in Chart     Glucose, capillary     Status: Abnormal    Collection Time: 03/22/23  4:24 PM  Result Value Ref Range    Glucose-Capillary 144 (H) 70 - 99 mg/dL      Comment: Glucose reference range applies only to samples taken after fasting for at least 8 hours.    Comment 1 Notify RN      Comment 2 Document in Chart    Glucose, capillary     Status: Abnormal    Collection Time: 03/22/23  9:23 PM  Result Value Ref Range    Glucose-Capillary 187 (H) 70 - 99 mg/dL      Comment: Glucose reference range applies only to samples taken after fasting for at least 8 hours.    Comment 1 Notify RN      Comment 2 Document in Chart        Imaging Results (Last 48 hours)  No results found.         Blood pressure (!) 123/57, pulse 72, temperature 97.8 F (36.6 C), temperature source Oral, resp. rate 18, height 6' 2 (1.88 m), weight 67.4 kg, SpO2 91%.   Medical Problem List and Plan: 1. 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  Soft cervical collar for comfort.             -patient may  shower             -ELOS/Goals: 8-11 days- supervision 2.  Antithrombotics: -DVT/anticoagulation:  Mechanical: Antiembolism stockings, thigh (TED hose) Bilateral lower extremities             -antiplatelet therapy: Aspirin  81 mg daily 3. Pain Management/chronic back pain: Neurontin  100 mg nightly, Cymbalta  30 mg daily 4. Mood/Behavior/Sleep: Provide emotional support             -antipsychotic agents: N/A 5. Neuropsych/cognition: This patient is capable of making decisions on his own behalf. 6.  Skin/Wound Care: Routine skin checks 7. Fluids/Electrolytes/Nutrition: Routine in and outs with follow-up chemistries 8.  History of orthostatic hypotension.  ProAmatine  5 mg 3 times daily.  Patient followed by [facility] neurology 9.  Restless leg syndrome.  Requip  4 mg nightly 10.   Hyperlipidemia.  Crestor  11.  Diabetes mellitus with peripheral neuropathy.  Hemoglobin A1c 7.5.  Currently on SSI.  PTA patient on Trulicity  3 mg every Wednesday, Semglee 18 units daily, Tresiba FlexTouch weekly, Glucophage  1000 mg twice daily.  Resume as needed 12.  AKI.  Follow-up chemistries 13.  CAD with stenting.  Plavix  currently on hold and aspirin  has been initiated. 14.  Chronic anemia.  Continue iron supplement.  Follow-up CBC 15.  History of tobacco use.  Provide counseling 16. Droplet precautions- has been in hospital since 12/29- on droplet precautions the entire time- no coughing, cold S'xs- will send if can remove     Toribio JINNY Pitch, PA-C 03/23/2023     I have personally performed a face to face diagnostic evaluation of this patient and formulated the key components of the plan.  Additionally, I have personally reviewed laboratory data, imaging studies, as well as relevant notes and concur with the physician assistant's documentation above.   The patient's status has not changed from the original H&P.  Any changes in documentation from the acute care chart have been noted above.

## 2023-03-23 NOTE — Progress Notes (Signed)
 Pt declined bowel program and stated he is continent and having normal bowel pattern.  Dominica Severin  RN

## 2023-03-23 NOTE — Plan of Care (Signed)
  Problem: Ischemic Stroke/TIA Tissue Perfusion: Goal: Complications of ischemic stroke/TIA will be minimized Outcome: Progressing   Problem: Education: Goal: Knowledge of disease or condition will improve Outcome: Progressing Goal: Knowledge of patient specific risk factors will improve Alonso N/A or DELETE if not current risk factor) Outcome: Progressing   Problem: Coping: Goal: Will identify appropriate support needs Outcome: Progressing   Problem: Self-Care: Goal: Ability to communicate needs accurately will improve Outcome: Progressing   Problem: Nutrition: Goal: Risk of aspiration will decrease Outcome: Progressing

## 2023-03-23 NOTE — Discharge Summary (Signed)
Physician Discharge Summary  Patient ID: PAULL SCHRIMSHER MRN: [id number] DOB/AGE: 1940-04-29 83 y.o.  Admit date: 03/23/2023 Discharge date: 04/01/2023  Discharge Diagnoses:  Principal Problem:   Contusion of cervical cord, initial encounter [facility]) Active Problems:   Constipation Pain management History of orthostatic hypotension Restless leg syndrome Hyperlipidemia Diabetes mellitus with peripheral neuropathy AKI CAD with stenting Chronic anemia History of tobacco use Neurogenic bowel Possible neurogenic bladder History of prostate cancer Mood stabilization  Discharged Condition: Stable  Significant Diagnostic Studies: DG CHEST PORT 1 VIEW Result Date: 03/20/2023 CLINICAL DATA:  Leukocytosis. EXAM: PORTABLE CHEST 1 VIEW COMPARISON:  Radiographs 03/14/2023 and 11/01/2022. Chest CT 03/14/2023 and abdominal CT 03/15/2023. FINDINGS: 0633 hours. The heart size and mediastinal contours are stable with aortic atherosclerosis. There are lower lung volumes with increased patchy bibasilar airspace opacities compared with the 03/14/2023 radiographs. These are similar to the more recent abdominal CT of 03/15/2023 and suspicious for bronchopneumonia or aspiration. No pleural effusion or pneumothorax. The bones appear unchanged. IMPRESSION: Lower lung volumes with increased patchy bibasilar airspace opacities suspicious for bronchopneumonia or aspiration. Electronically Signed   By: Carey Bullocks M.D.   On: 03/20/2023 10:27   MR CERVICAL SPINE WO CONTRAST Result Date: 03/18/2023 CLINICAL DATA:  Ataxia, cervical trauma. EXAM: MRI CERVICAL SPINE WITHOUT CONTRAST TECHNIQUE: Multiplanar, multisequence MR imaging of the cervical spine was performed. No intravenous contrast was administered. COMPARISON:  Cervical spine CT 03/14/2023 FINDINGS: Alignment: Mild reversal of the normal cervical lordosis. Trace anterolisthesis of C3 on C4 and C4 on C5, unchanged. Vertebrae: No acute fracture or suspicious marrow  lesion. Cord: Abnormal confluent T2 hyperintensity in the right aspect of the spinal cord extending from the superior C3 level to the C4-5 disc space level, greatest at C4. No evidence of cord hemorrhage on gradient echo imaging. Small amount of abnormal ventral STIR hyperintense signal behind the C3 vertebral body and C2-3 disc space measuring up to 2 mm in thickness and suspicious for a small epidural hematoma. Possible trace ventral and dorsal epidural hematoma at C4 and C5. Possible posterior longitudinal ligament disruption at C2-3. Posterior Fossa, vertebral arteries, paraspinal tissues: No significant prevertebral fluid or soft tissue swelling. Preserved vertebral artery flow voids. 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 C2-3: Mild uncovertebral spurring, asymmetrically severe left facet arthrosis with facet ankylosis, and suspected small volume epidural hematoma result in mild spinal stenosis and mild left neural foraminal stenosis. C3-4: Disc bulging, uncovertebral spurring, suspected small volume epidural hematoma, and moderate right and severe left facet arthrosis result in moderate spinal stenosis and mild right and severe left neural foraminal stenosis. C4-5: Disc bulging, uncovertebral spurring, and moderate to severe right and mild left facet arthrosis result in moderate spinal stenosis and mild-to-moderate right neural foraminal stenosis. C5-6: Disc bulging and uncovertebral spurring result in moderate spinal stenosis without significant neural foraminal stenosis. C6-7: Mild disc bulging without significant stenosis. C7-T1: Negative. These results will be called to the ordering clinician or representative by the Radiologist Assistant, and communication documented in the PACS or [employer]. IMPRESSION: 1. Abnormal cord signal at C3-4 and C4-5 compatible with edema/contusion. No evidence of cord hemorrhage. 2. Suspected small volume epidural hematoma  in the upper cervical spine with possible PLL disruption at C2-3. 3. 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 Electronically Signed   By: Sebastian Ache M.D.   On: 03/18/2023 19:48   ECHOCARDIOGRAM COMPLETE Result  Date: 03/15/2023    ECHOCARDIOGRAM REPORT   Patient Name:   JOHNDAVID BOLDON [facility] Date of Exam: 03/15/2023 Medical Rec #:  [id number]      Height:       74.0 in Accession #:    [id number]     Weight:       148.6 lb Date of Birth:  Feb 17, 1941       BSA:          1.916 m Patient Age:    82 years       BP:           116/51 mmHg Patient Gender: M              HR:           90 bpm. Exam Location:  Inpatient Procedure: 2D Echo, 3D Echo, Cardiac Doppler, Color Doppler and Strain Analysis Indications:    Stroke  History:        Patient has no prior history of Echocardiogram examinations.                 Risk Factors:Hypertension, Diabetes, Dyslipidemia and Current                 Smoker.  Sonographer:    Karma Ganja Referring Phys: [id number] ASHISH ARORA  Sonographer Comments: Global longitudinal strain was attempted. IMPRESSIONS  1. Left ventricular ejection fraction, by estimation, is 55 to 60%. The left ventricle has normal function. The left ventricle has no regional wall motion abnormalities. Left ventricular diastolic parameters were normal.  2. Right ventricular systolic function is normal. The right ventricular size is normal. There is moderately elevated pulmonary artery systolic pressure.  3. The mitral valve is normal in structure. No evidence of mitral valve regurgitation. No evidence of mitral stenosis.  4. The aortic valve is normal in structure. Aortic valve regurgitation is not visualized. No aortic stenosis is present.  5. The inferior vena cava is dilated in size with >50% respiratory variability, suggesting right atrial pressure of 8 mmHg. FINDINGS  Left Ventricle: Left ventricular ejection  fraction, by estimation, is 55 to 60%. The left ventricle has normal function. The left ventricle has no regional wall motion abnormalities. The left ventricular internal cavity size was normal in size. There is  no left ventricular hypertrophy. Left ventricular diastolic parameters were normal. Right Ventricle: The right ventricular size is normal. No increase in right ventricular wall thickness. Right ventricular systolic function is normal. There is moderately elevated pulmonary artery systolic pressure. The tricuspid regurgitant velocity is 2.94 m/s, and with an assumed right atrial pressure of 15 mmHg, the estimated right ventricular systolic pressure is 49.6 mmHg. Left Atrium: Left atrial size was normal in size. Right Atrium: Right atrial size was normal in size. Pericardium: There is no evidence of pericardial effusion. Mitral Valve: The mitral valve is normal in structure. No evidence of mitral valve regurgitation. No evidence of mitral valve stenosis. Tricuspid Valve: The tricuspid valve is normal in structure. Tricuspid valve regurgitation is trivial. No evidence of tricuspid stenosis. Aortic Valve: The aortic valve is normal in structure. Aortic valve regurgitation is not visualized. No aortic stenosis is present. Aortic valve mean gradient measures 5.0 mmHg. Aortic valve peak gradient measures 9.5 mmHg. Aortic valve area, by VTI measures 2.57 cm. Pulmonic Valve: The pulmonic valve was normal in structure. Pulmonic valve regurgitation is not visualized. No evidence of pulmonic stenosis. Aorta: The aortic root is normal in size and structure. Venous: The inferior vena  cava is dilated in size with greater than 50% respiratory variability, suggesting right atrial pressure of 8 mmHg. IAS/Shunts: No atrial level shunt detected by color flow Doppler.  LEFT VENTRICLE PLAX 2D LVIDd:         4.40 cm   Diastology LVIDs:         2.80 cm   LV e' medial:    8.92 cm/s LV PW:         1.00 cm   LV E/e' medial:  8.4 LV  IVS:        1.00 cm   LV e' lateral:   9.57 cm/s LVOT diam:     2.20 cm   LV E/e' lateral: 7.8 LV SV:         68 LV SV Index:   36 LVOT Area:     3.80 cm                           3D Volume EF:                          3D EF:        53 %                          LV EDV:       150 ml                          LV ESV:       70 ml                          LV SV:        80 ml RIGHT VENTRICLE             IVC RV Basal diam:  4.40 cm     IVC diam: 2.30 cm RV S prime:     15.30 cm/s TAPSE (M-mode): 3.4 cm LEFT ATRIUM           Index        RIGHT ATRIUM           Index LA diam:      3.40 cm 1.77 cm/m   RA Area:     17.80 cm LA Vol (A2C): 59.8 ml 31.22 ml/m  RA Volume:   50.70 ml  26.47 ml/m LA Vol (A4C): 55.8 ml 29.13 ml/m  AORTIC VALVE AV Area (Vmax):    2.24 cm AV Area (Vmean):   2.24 cm AV Area (VTI):     2.57 cm AV Vmax:           154.00 cm/s AV Vmean:          106.000 cm/s AV VTI:            0.265 m AV Peak Grad:      9.5 mmHg AV Mean Grad:      5.0 mmHg LVOT Vmax:         90.70 cm/s LVOT Vmean:        62.400 cm/s LVOT VTI:          0.179 m LVOT/AV VTI ratio: 0.68  AORTA Ao Root diam: 3.60 cm Ao Asc diam:  3.00 cm MITRAL VALVE               TRICUSPID VALVE MV Area (  PHT): 3.68 cm    TR Peak grad:   34.6 mmHg MV Decel Time: 206 msec    TR Vmax:        294.00 cm/s MV E velocity: 74.90 cm/s MV A velocity: 94.30 cm/s  SHUNTS MV E/A ratio:  0.79        Systemic VTI:  0.18 m                            Systemic Diam: 2.20 cm Aditya Sabharwal Electronically signed by Dorthula Nettles Signature Date/Time: 03/15/2023/10:55:31 AM    Final    CT ABDOMEN PELVIS WO CONTRAST Result Date: 03/15/2023 CLINICAL DATA:  Abdominal pain. Acute decrease in hemoglobin with tender abdomen. Evaluate for hematoma EXAM: CT ABDOMEN AND PELVIS WITHOUT CONTRAST TECHNIQUE: Multidetector CT imaging of the abdomen and pelvis was performed following the standard protocol without IV contrast. RADIATION DOSE REDUCTION: This exam was performed  according to the departmental dose-optimization program which includes automated exposure control, adjustment of the mA and/or kV according to patient size and/or use of iterative reconstruction technique. COMPARISON:  03/14/2023 FINDINGS: Lower chest: Dependent collapse/consolidation in both lower lobes is progressive in the interval with areas of peripheral airway impaction in the dependent lower lungs. Hepatobiliary: No suspicious focal abnormality in the liver on this study without intravenous contrast. Cholecystectomy. No intrahepatic or extrahepatic biliary dilation. Pancreas: No focal mass lesion. No dilatation of the main duct. No intraparenchymal cyst. Diffuse atrophy. Spleen: No splenomegaly. No suspicious focal mass lesion. Adrenals/Urinary Tract: No adrenal nodule or mass. Small nonobstructing stones are evident in both kidneys. High attenuation in the renal pelvis of each kidney in the ureters and the bladder lumen is compatible with excreted contrast from yesterday's CT scan. No hydroureteronephrosis. Bladder lumen opacified but otherwise unremarkable. Stomach/Bowel: Stomach is unremarkable. No gastric wall thickening. No evidence of outlet obstruction. Duodenum is normally positioned as is the ligament of Treitz. No small bowel wall thickening. No small bowel dilatation. The terminal ileum is normal. The appendix is normal. The tip of the appendix appears to be filled with soft tissue (51/3) without distension. This finding is stable comparing back to a CT from 03/24/2021 but appears minimally progressive when comparing to 09/12/2020. No gross colonic mass. No colonic wall thickening. Moderate to large stool volume evident. Vascular/Lymphatic: There is moderate atherosclerotic calcification of the abdominal aorta without aneurysm. There is no gastrohepatic or hepatoduodenal ligament lymphadenopathy. No retroperitoneal or mesenteric lymphadenopathy. No pelvic sidewall lymphadenopathy. Reproductive:  Brachytherapy seeds noted in the prostate gland. Other: No intraperitoneal free fluid. Musculoskeletal: No pelvic sidewall or retroperitoneal hematoma. There is some minimal thickening of the right posterior renal fascia, stable to minimally progressive since 03/14/2023, but this is only minimal and is nonspecific, not representing a discrete hematoma or substantial hemorrhage. Lumbar fusion hardware evident. The patient is noted to have asymmetry of the gluteus musculature inferiorly with asymmetric enlargement on the left although this is stable to minimally increased compared to 03/14/2023 and has been incompletely visualized. IMPRESSION: 1. No pelvic sidewall or retroperitoneal hematoma. There is some minimal thickening of the right posterior renal fascia, stable to minimally progressive since 03/14/2023, but this is only minimal and is nonspecific, not representing a discrete hematoma or substantial hemorrhage. 2. Asymmetry of the gluteus musculature inferiorly with asymmetric enlargement on the left although this is stable to may be minimally increased compared to 03/14/2023 and has been incompletely visualized. There is not a discrete hematoma  a discernible on this study but a component of intramuscular hemorrhage could have this appearance. This would likely be amenable to clinical inspection. 3. Dependent collapse/consolidation in both lower lobes is progressive in the interval with areas of peripheral airway impaction in the dependent lower lungs. Imaging features compatible may be infectious although aspiration could have this appearance. 4. The tip of the appendix appears to be filled with soft tissue without distension. This finding is stable comparing back to a CT from 03/24/2021 but appears minimally progressive when comparing to 09/12/2020. This is a nonspecific finding and may be related to mucocele. Follow-up CT in 6 months recommended. Non emergent follow-up surgical consultation recommended. 5.  Small nonobstructing stones in both kidneys. 6.  Aortic Atherosclerosis (ICD10-I70.0). Electronically Signed   By: Kennith Center M.D.   On: 03/15/2023 06:24   MR BRAIN WO CONTRAST Result Date: 03/15/2023 CLINICAL DATA:  Stroke/TIA.  Status post T NK. EXAM: MRI HEAD WITHOUT CONTRAST TECHNIQUE: Multiplanar, multiecho pulse sequences of the brain and surrounding structures were obtained without intravenous contrast. COMPARISON:  01/18/2023 FINDINGS: Brain: No acute infarct, mass effect or extra-axial collection. 3-5 chronic microhemorrhages in the cerebellum. There is multifocal hyperintense T2-weighted signal within the white matter. Parenchymal volume and CSF spaces are normal. The midline structures are normal. Vascular: Normal flow voids. Skull and upper cervical spine: Posterior scalp subgaleal collection. Sinuses/Orbits:No paranasal sinus fluid levels or advanced mucosal thickening. No mastoid or middle ear effusion. Normal orbits. IMPRESSION: 1. No acute intracranial abnormality. 2. Findings of chronic small vessel ischemia. 3. Posterior scalp subgaleal hematoma. Electronically Signed   By: Deatra Robinson M.D.   On: 03/15/2023 00:01   CT HEAD WO CONTRAST ( ) Result Date: 03/14/2023 CLINICAL DATA:  Stroke, follow-up. Persistent right upper extremity ataxia and right lower extremity dysmetria. Status post TNK. EXAM: CT HEAD WITHOUT CONTRAST TECHNIQUE: Contiguous axial images were obtained from the base of the skull through the vertex without intravenous contrast. RADIATION DOSE REDUCTION: This exam was performed according to the departmental dose-optimization program which includes automated exposure control, adjustment of the mA and/or kV according to patient size and/or use of iterative reconstruction technique. COMPARISON:  CT head and CT angio head and neck 03/14/2023 at 12:15 a.m. FINDINGS: Brain: Mild atrophy and white matter disease is stable. No acute infarct or hemorrhage is present. Deep brain  nuclei are within normal limits. The ventricles are proportionate to the degree of atrophy. No significant extraaxial fluid collection is present. The brainstem and cerebellum are within normal limits. Midline structures are within normal limits. Vascular: Atherosclerotic calcifications are again noted within the cavernous internal carotid arteries bilaterally and at the dural margin of the right vertebral artery. No hyperdense vessel is present. Skull: A left parietal and occipital scalp hematoma is new. No acute fracture is present. Sinuses/Orbits: The paranasal sinuses and mastoid air cells are clear. Bilateral lens replacements are noted. Globes and orbits are otherwise unremarkable. IMPRESSION: 1. No acute intracranial abnormality or significant interval change. 2. Stable atrophy and white matter disease. This likely reflects the sequela of chronic microvascular ischemia. 3. New left parietal and occipital scalp hematoma without underlying fracture. Electronically Signed   By: Marin Roberts M.D.   On: 03/14/2023 17:54   CT CHEST ABDOMEN PELVIS W CONTRAST Result Date: 03/14/2023 CLINICAL DATA:  Chest trauma, blunt Abdominal trauma, blunt. Stroke, fall. EXAM: CT CHEST, ABDOMEN, AND PELVIS WITH CONTRAST TECHNIQUE: Multidetector CT imaging of the chest, abdomen and pelvis was performed following the standard protocol during  bolus administration of intravenous contrast. RADIATION DOSE REDUCTION: This exam was performed according to the departmental dose-optimization program which includes automated exposure control, adjustment of the mA and/or kV according to patient size and/or use of iterative reconstruction technique. CONTRAST:  75mL OMNIPAQUE IOHEXOL 350 MG/ML SOLN COMPARISON:  CT angio chest 11/18/2022, CT angio chest 03/18/2021 FINDINGS: CHEST: Cardiovascular: No aortic injury. The thoracic aorta is normal in caliber. The heart is normal in size. No significant pericardial effusion. Moderate  atherosclerotic plaque. At least 2 vessel coronary artery calcification. The main pulmonary artery is normal in caliber. No central or segmental pulmonary embolus. Limited evaluation more distally due to timing of contrast and motion artifact. Mediastinum/Nodes: No pneumomediastinum. No mediastinal hematoma. The esophagus is unremarkable. The thyroid is unremarkable. The central airways are patent. No mediastinal, hilar, or axillary lymphadenopathy. Lungs/Pleura: Bilateral lower lobe bronchial wall thickening. Bilateral lower lobe mucous plugging, right greater than left. Bibasilar atelectasis. Left lower lobe ground-glass airspace opacity. Stable left lower lobe calcified pulmonary micronodule-no further follow-up indicated. No pulmonary mass. No pulmonary contusion or laceration. No pneumatocele formation. No pleural effusion. No pneumothorax. No hemothorax. Musculoskeletal/Chest wall: No chest wall mass. No acute rib or sternal fracture. No spinal fracture. ABDOMEN / PELVIS: Hepatobiliary: Not enlarged. No focal lesion. No laceration or subcapsular hematoma. Status post cholecystectomy.  No biliary ductal dilatation. Pancreas: Normal pancreatic contour. No main pancreatic duct dilatation. Spleen: Not enlarged. No focal lesion. No laceration, subcapsular hematoma, or vascular injury. Adrenals/Urinary Tract: No nodularity bilaterally. Bilateral kidneys enhance and excrete symmetrically. No hydronephrosis. No contusion, laceration, or subcapsular hematoma. No injury to the vascular structures or collecting systems. No hydroureter. The urinary bladder is unremarkable. Stomach/Bowel: No small or large bowel wall thickening or dilatation. Stool throughout the colon. The appendix is unremarkable. Vasculature/Lymphatics: Severe atherosclerotic plaque no abdominal aorta or iliac aneurysm. No active contrast extravasation or pseudoaneurysm. No abdominal, pelvic, inguinal lymphadenopathy. Reproductive: Radiation seeds along  the prostate. Other: No simple free fluid ascites. No pneumoperitoneum. No hemoperitoneum. No mesenteric hematoma identified. No organized fluid collection. Musculoskeletal: No significant soft tissue hematoma. No acute pelvic fracture. No spinal fracture. L2-S2 posterolateral interbody surgical hardware fusion. Ports and Devices: None. IMPRESSION: 1. No acute intrathoracic, intra-abdominal, intrapelvic traumatic injury. 2. No acute fracture or traumatic malalignment of the thoracic or lumbar spine. 3. Other imaging findings of potential clinical significance: Bilateral lower lobe bronchial wall thickening with bilateral mucous plugging and patchy ground-glass airspace opacity left lower lobe-question developing infection/inflammation. Stool throughout the colon-correlate for constipation. Aortic Atherosclerosis (ICD10-I70.0)-severe. Electronically Signed   By: Tish Frederickson M.D.   On: 03/14/2023 03:34   DG Chest Port 1 View Result Date: 03/14/2023 CLINICAL DATA:  Recent fall EXAM: PORTABLE CHEST 1 VIEW COMPARISON:  11/01/2022 FINDINGS: Cardiac shadows within normal limits. Aortic calcifications are noted. Skin fold is noted over the right chest. Minimal left basilar atelectasis is seen. No focal confluent infiltrate is noted. No bony abnormality is noted. IMPRESSION: Mild left basilar atelectasis. Electronically Signed   By: Alcide Clever M.D.   On: 03/14/2023 00:58   CT ANGIO HEAD NECK W WO CM (CODE STROKE) Result Date: 03/14/2023 CLINICAL DATA:  Right arm weakness, right pupil blown, head trauma EXAM: CT ANGIOGRAPHY HEAD AND NECK WITH AND WITHOUT CONTRAST TECHNIQUE: Multidetector CT imaging of the head and neck was performed using the standard protocol during bolus administration of intravenous contrast. Multiplanar CT image reconstructions and MIPs were obtained to evaluate the vascular anatomy. Carotid stenosis measurements (when applicable) are  obtained utilizing NASCET criteria, using the distal  internal carotid diameter as the denominator. RADIATION DOSE REDUCTION: This exam was performed according to the departmental dose-optimization program which includes automated exposure control, adjustment of the mA and/or kV according to patient size and/or use of iterative reconstruction technique. CONTRAST:  75mL OMNIPAQUE IOHEXOL 350 MG/ML SOLN COMPARISON:  09/13/2022 CTA head and neck, correlation is made with 03/14/2023 CT head FINDINGS: CT HEAD FINDINGS For noncontrast findings, please see same day CT head. CTA NECK FINDINGS Aortic arch: Standard branching. Imaged portion shows no evidence of aneurysm or dissection. No significant stenosis of the major arch vessel origins. Aortic atherosclerosis Right carotid system: No evidence of dissection, occlusion, or hemodynamically significant stenosis (greater than 50%). Left carotid system: No evidence of dissection, occlusion, or hemodynamically significant stenosis (greater than 50%). Vertebral arteries: Moderate stenosis at the origin of the left vertebral artery. Evaluation of the proximal left vertebral artery is limited by beam hardening artifact from the adjacent contrast bolus. The left vertebral artery is otherwise patent to the skull base. Mild stenosis at the origin of the right vertebral artery. The right vertebral artery is otherwise patent to the skull base. No evidence of dissection. Skeleton: No acute osseous abnormality. Degenerative changes in the cervical spine. Other neck: No acute finding. Upper chest: No focal pulmonary opacity or pleural effusion. Review of the MIP images confirms the above findings CTA HEAD FINDINGS Anterior circulation: Both internal carotid arteries are patent to the termini, with mild stenosis in the bilateral cavernous segments and moderate to severe stenosis in the proximal supraclinoid segments bilaterally. A1 segments patent. Normal anterior communicating artery. Anterior cerebral arteries are patent to their distal  aspects without significant stenosis. No M1 stenosis or occlusion. MCA branches are irregular but perfused to their distal aspects without significant stenosis. Posterior circulation: Vertebral arteries patent to the vertebrobasilar junction, with mild stenosis in the proximal right V4. The left vertebral artery primarily supplies the left PICA, and is quite diminutive after the PICA takeoff. Posterior inferior cerebellar arteries patent proximally. Basilar patent to its distal aspect without significant stenosis. Superior cerebellar arteries patent proximally. 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 Redemonstrated severe stenosis in the proximal left P2 (series 7, images 98-99), with multifocal irregularity in the remainder of the left P2. Evaluation of the more distal PCAs is limited by venous contamination. The bilateral posterior communicating arteries are not definitively seen. Venous sinuses: As permitted by contrast timing, patent. Anatomic variants: None significant. No evidence of aneurysm or vascular malformation. Review of the MIP images confirms the above findings IMPRESSION: 1. No intracranial large vessel occlusion. 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 2. 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 3. 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 4. Moderate stenosis at the origin of the left vertebral artery and mild stenosis at the origin of the right vertebral artery. 5. Aortic atherosclerosis. Aortic Atherosclerosis (ICD10-I70.0). Electronically Signed   By: Wiliam Ke M.D.   On: 03/14/2023 00:46   CT Cervical Spine Wo Contrast Result Date:  03/14/2023 CLINICAL DATA:  Neck trauma, fell backwards EXAM: CT CERVICAL SPINE WITHOUT CONTRAST TECHNIQUE: Multidetector CT imaging of the cervical spine was performed  without intravenous contrast. Multiplanar CT image reconstructions were also generated. RADIATION DOSE REDUCTION: This exam was performed according to the departmental dose-optimization program which includes automated exposure control, adjustment of the mA and/or kV according to patient size and/or use of iterative reconstruction technique. COMPARISON:  02/21/2023 CT cervical spine FINDINGS: Alignment: No traumatic listhesis. Straightening of the normal cervical lordosis. Trace stepwise anterolisthesis of C3 on C4 and C4 on C5. Skull base and vertebrae: No acute fracture. No primary bone lesion or focal pathologic process. Soft tissues and spinal canal: No prevertebral fluid or swelling. No visible canal hematoma. Disc levels: Degenerative changes in the cervical spine. No high-grade spinal canal stenosis. Upper chest: Negative. IMPRESSION: No acute fracture or traumatic listhesis in the cervical spine. Electronically Signed   By: Wiliam Ke M.D.   On: 03/14/2023 00:34   CT HEAD CODE STROKE WO CONTRAST Result Date: 03/14/2023 CLINICAL DATA:  Code stroke. Right arm weakness, right pupil blown, trauma to posterior head after fall EXAM: CT HEAD WITHOUT CONTRAST TECHNIQUE: Contiguous axial images were obtained from the base of the skull through the vertex without intravenous contrast. RADIATION DOSE REDUCTION: This exam was performed according to the departmental dose-optimization program which includes automated exposure control, adjustment of the mA and/or kV according to patient size and/or use of iterative reconstruction technique. COMPARISON:  02/21/2023 CT head FINDINGS: Brain: No evidence of acute infarction, hemorrhage, mass, mass effect, or midline shift. No hydrocephalus or extra-axial collection. Basal ganglia calcifications. Normal  cerebral volume. Vascular: No hyperdense vessel. Atherosclerotic calcifications in the intracranial carotid and vertebral arteries. Skull: Negative for fracture or focal lesion. Left occipital scalp hematoma. Sinuses/Orbits: No acute finding. Other: The mastoid air cells are well aerated. ASPECTS [facility] Stroke Program Early CT Score) - Ganglionic level infarction (caudate, lentiform nuclei, internal capsule, insula, M1-M3 cortex): 7 - Supraganglionic infarction (M4-M6 cortex): 3 Total score (0-10 with 10 being normal): 10 IMPRESSION: 1. No acute intracranial process. ASPECTS is 10. 2. Left occipital scalp hematoma. Imaging results were communicated on 03/14/2023 at 12:21 am to provider Dr. Wilford Corner via secure text paging. Electronically Signed   By: Wiliam Ke M.D.   On: 03/14/2023 00:21    Labs:  Basic Metabolic Panel: Recent Labs  Lab 03/31/23 0530  NA 137  K 4.8  CL 97*  CO2 28  GLUCOSE 132*  BUN 27*  CREATININE 1.48*  CALCIUM 9.3     CBC: Recent Labs  Lab 03/31/23 0530  WBC 7.7  NEUTROABS 4.0  HGB 9.7*  HCT 29.8*  MCV 95.5  PLT 425*     CBG: Recent Labs  Lab 03/30/23 2038 03/31/23 0534 03/31/23 1201 03/31/23 1717 03/31/23 2101  GLUCAP 121* 118* 113* 124* 76   Family history.  Mother with hypertension and diabetes as well as myocardial infarction.  Denies any colon cancer esophageal cancer or rectal cancer  Brief HPI:   CUTLER PATRIARCA is a 83 y.o. 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  Patient does smoke cigars.  Per chart review patient lives with spouse.  Independent prior to admission.  Presented 03/14/2023 after a fall while returning  from the bathroom when he struck his head sustaining a small laceration on the back of his head.  Denied loss of consciousness.  Initial cranial CT scan showed no acute intracranial process.  Left occipital scalp hematoma.  CT cervical spine negative however MRI cervical spine showed cord edema/contusion at C3-5 and was placed in a cervical soft collar per neurosurgery.  CT of the head and neck showed no intracranial large vessel occlusion.  Demonstrated severe stenosis of the right PCA near the P1-P2 junction with the remainder of the right PCA being diminutive and poorly opacified.  Severe stenosis in the proximal left P2 with multifocal irregularity in the remainder of the left P2.  Moderate to severe stenosis of the proximal supraclinoid segments of the bilateral ICAs.  Moderate stenosis of the origin of the left vertebral artery and mild stenosis of the origin of the right vertebral artery.  CT of the chest abdomen and pelvis showed no acute intrathoracic intra-abdominal or intrapelvic traumatic injury.  Patient did receive TNK.  MRI of the brain follow-up showed no acute intracranial abnormalities finding of small vessel ischemia as well as noted posterior scalp subgaleal hematoma.  Admission chemistries unremarkable except creatinine 1.34 lactic acid 1.7 alcohol negative.  Patient was cleared to begin low-dose aspirin but chronic Plavix remained on hold.  Hospital course monitoring of blood pressure maintained on ProAmatine.  Therapy evaluations completed due to patient decreased functional mobility was admitted for a comprehensive rehab program.   Hospital Course: DELOSS RATHEL was admitted to rehab 03/23/2023 for inpatient therapies to consist of PT, ST and OT at least three hours five days a week. Past admission physiatrist, therapy team and rehab RN have worked together to provide customized collaborative inpatient rehab.  Pertaining to patient's cervical spinal cord contusion status post fall due to  possible orthostatic hypotension as well as scalp hematoma.  Patient did sustain a small laceration on the back of his head.  Soft cervical collar for comfort.  DVT prophylaxis with support hose.  Maintained on low-dose aspirin.  Chronic back pain Neurontin as advised with Cymbalta.  History of orthostatic hypotension ProAmatine 5 mg 3 times daily patient followed by [facility] neurology.  History of restless leg syndrome.  Requip 4 mg at bedtime.  Crestor ongoing for hyperlipidemia.  Blood sugars overall controlled hemoglobin A1c 7.5 patient on Trulicity as well as Semglee and [place] prior to admission as well as Glucophage these were resumed as needed is monitoring of blood sugars.  CAD with stenting patient's Plavix currently on hold and aspirin has been initiated.  Chronic anemia no bleeding episodes remain on iron supplement.  History of tobacco use with counseling provided in regards to cessation of nicotine products.   Blood pressures were monitored on TID basis and remained soft and monitored  Diabetes has been monitored with ac/hs CBG checks and SSI was use prn for tighter BS control.    Rehab course: During patient's stay in rehab weekly team conferences were held to monitor patient's progress, set goals and discuss barriers to discharge. At admission, patient required contact-guard assist 12 feet rolling walker contact-guard sit to stand  Physical exam.  Blood pressure 123/57 pulse 72 temperature 97.8 respirations 18 oxygen saturation is 91% room air Constitutional.  No acute distress HEENT Head.  Small laceration back of head clean and dry Eyes.  Pupils round and reactive to light no discharge without nystagmus Neck.  Soft collar in place Cardiac regular rate and rhythm without any extra sounds or murmur heard Abdomen.  Soft nontender positive bowel sounds without rebound Respiratory effort normal no respiratory distress without wheeze Musculoskeletal Comments.  Right  upper extremity  biceps 4+/5 triceps 4/5W EE 4+/5 grip and FA 4+/5-5 minus/5 Left upper extremity 5 -/5 throughout Lower extremity hip flexors 4+/5 KE/KF/DF and PF 5 -/5 bilaterally Significant decreased fine motor skills in hands left greater than right  He/She  has had improvement in activity tolerance, balance, postural control as well as ability to compensate for deficits. He/She has had improvement in functional use RUE/LUE  and RLE/LLE as well as improvement in awareness.Ambulates 170 feet without device /CGA.Sit to stand CGA and transfers.Gathers beloings for ADLs working with energy conservation.Family teaching completed and discharge to home.       Disposition:  Discharge disposition: 06-Home-Health Care Svc        Diet: Diabetic diet  Special Instructions: No driving smoking or alcohol  Medications at discharge. 1.  Tylenol as needed 2.  Aspirin 81 mg p.o. daily 3.  Vitamin B12 1000 mcg p.o. daily 4.  Cymbalta 30 mg p.o. daily 5.  Ferrous sulfate 325 mg p.o. daily 6.  Neurontin 100 mg p.o. nightly 7.  ProAmatine 5 mg p.o. 3 times daily with meals 8.  Protonix 40 mg p.o. daily 9.  Crestor 20 mg p.o. daily 10.  Senokot S1 tablet p.o. nightly as needed 11.Glucophage 1000 mg BID 12.  Requip 4 mg nightly 13.  Trulicity 3 mg into the skin weekly 14.  Tresiba 100 units every Wednesday 15.  Vitamin C 500 mg daily 16.  Vitamin D 1000 units daily 17.  Semglee 18 units daily 18.  Multivitamin daily 19.  Nitroglycerin as needed  30-35 minutes were spent completing discharge summary and discharge planning  Discharge Instructions     Ambulatory referral to Neurology   Complete by: As directed    An appointment is requested in approximately: 4 weeks spinal cord contusion   Ambulatory referral to Physical Medicine Rehab   Complete by: As directed    Moderate complexity follow up 1-2 weeks cervical cord contusion        Follow-up Information     Lovorn, Aundra Millet, MD Follow up.    Specialty: Physical Medicine and Rehabilitation Why: Office to call for appointment Contact information: [address] [place] [address] [phone]         Elder Negus, MD Follow up.   Specialties: Cardiology, Radiology Why: Call for appointment Contact information: [address] [place] [address] [phone]                 Signed: Mcarthur Rossetti Mariellen Blaney 04/01/2023, 5:00 AM

## 2023-03-23 NOTE — Progress Notes (Signed)
 Patient arrived to unit and oriented to  unit routine. Verbalized willingness to follow unit safety protocols. Resting comfortably with call bell in place.

## 2023-03-23 NOTE — Progress Notes (Signed)
 Inpatient Rehab Admissions Coordinator:    I have insurance approval and a bed available for pt to admit to CIR today. Dr. Jerri in agreement and [facility] aware.  I met with pt at bedside and he is in agreement to proceed with admit today.  I left a message for his spouse to update her as well.   Reche Lowers, PT, DPT Admissions Coordinator [phone] 03/23/23  11:52 AM

## 2023-03-24 DIAGNOSIS — S14109A Unspecified injury at unspecified level of cervical spinal cord, initial encounter: Secondary | ICD-10-CM | POA: Diagnosis not present

## 2023-03-24 LAB — CBC WITH DIFFERENTIAL/PLATELET
Abs Immature Granulocytes: 0.08 10*3/uL — ABNORMAL HIGH (ref 0.00–0.07)
Basophils Absolute: 0 10*3/uL (ref 0.0–0.1)
Basophils Relative: 0 %
Eosinophils Absolute: 0.3 10*3/uL (ref 0.0–0.5)
Eosinophils Relative: 6 %
HCT: 24 % — ABNORMAL LOW (ref 39.0–52.0)
Hemoglobin: 8.2 g/dL — ABNORMAL LOW (ref 13.0–17.0)
Immature Granulocytes: 1 %
Lymphocytes Relative: 23 %
Lymphs Abs: 1.3 10*3/uL (ref 0.7–4.0)
MCH: 31.7 pg (ref 26.0–34.0)
MCHC: 34.2 g/dL (ref 30.0–36.0)
MCV: 92.7 fL (ref 80.0–100.0)
Monocytes Absolute: 0.6 10*3/uL (ref 0.1–1.0)
Monocytes Relative: 11 %
Neutro Abs: 3.2 10*3/uL (ref 1.7–7.7)
Neutrophils Relative %: 59 %
Platelets: 389 10*3/uL (ref 150–400)
RBC: 2.59 MIL/uL — ABNORMAL LOW (ref 4.22–5.81)
RDW: 13.1 % (ref 11.5–15.5)
WBC: 5.6 10*3/uL (ref 4.0–10.5)
nRBC: 0 % (ref 0.0–0.2)

## 2023-03-24 LAB — COMPREHENSIVE METABOLIC PANEL
ALT: 16 U/L (ref 0–44)
AST: 21 U/L (ref 15–41)
Albumin: 2.1 g/dL — ABNORMAL LOW (ref 3.5–5.0)
Alkaline Phosphatase: 86 U/L (ref 38–126)
Anion gap: 10 (ref 5–15)
BUN: 16 mg/dL (ref 8–23)
CO2: 27 mmol/L (ref 22–32)
Calcium: 8.6 mg/dL — ABNORMAL LOW (ref 8.9–10.3)
Chloride: 98 mmol/L (ref 98–111)
Creatinine, Ser: 1.14 mg/dL (ref 0.61–1.24)
GFR, Estimated: >60 mL/min (ref >60)
Glucose, Bld: 207 mg/dL — ABNORMAL HIGH (ref 70–99)
Potassium: 3.8 mmol/L (ref 3.5–5.1)
Sodium: 135 mmol/L (ref 135–145)
Total Bilirubin: 1 mg/dL (ref 0.0–1.2)
Total Protein: 5.9 g/dL — ABNORMAL LOW (ref 6.5–8.1)

## 2023-03-24 LAB — GLUCOSE, CAPILLARY
Glucose-Capillary: 208 mg/dL — ABNORMAL HIGH (ref 70–99)
Glucose-Capillary: 186 mg/dL — ABNORMAL HIGH (ref 70–99)
Glucose-Capillary: 167 mg/dL — ABNORMAL HIGH (ref 70–99)
Glucose-Capillary: 223 mg/dL — ABNORMAL HIGH (ref 70–99)

## 2023-03-24 MED ORDER — METFORMIN HCL 500 MG PO TABS
500.0000 mg | ORAL_TABLET | Freq: Two times a day (BID) | ORAL | Status: DC
  Administered 2023-03-25 – 2023-03-27 (×5): 500 mg via ORAL
  Filled 2023-03-24 (×5): qty 1

## 2023-03-24 NOTE — Evaluation (Signed)
 Physical Therapy Assessment and Plan  Patient Details  Name: Harry Zamora MRN: [id number] Date of Birth: [birth year]  PT Diagnosis: Dizziness and giddiness Rehab Potential: Fair ELOS: 5-7 days   Today's Date: 03/24/2023 PT Individual Time: 8563-8445, 9184-9084 PT Individual Time Calculation (min): 78 min  , 60 min   Hospital Problem: Principal Problem:   Contusion of cervical cord, initial encounter [facility])   Past Medical History:  Past Medical History:  Diagnosis Date   Anemia    as an infant   Arthritis    Back pain    Depression    Disc displacement, lumbar    Gynecomastia    History of kidney stones    Hyperlipidemia    Hypertension    no longer on medications   Hypertriglyceridemia    Insomnia    Low back pain    Lumbar radiculopathy    Lumbar stenosis    OA (osteoarthritis)    Polyneuropathy in diabetes(357.2)    Prostate cancer (HCC) 2003   Restless legs    Ringing in ears    RLS (restless legs syndrome)    Type 2 diabetes mellitus (HCC)    Past Surgical History:  Past Surgical History:  Procedure Laterality Date   ABDOMINAL EXPOSURE N/A 07/13/2019   Procedure: ABDOMINAL EXPOSURE;  Surgeon: Serene Gaile LELON, MD;  Location: [facility] OR;  Service: Vascular;  Laterality: N/A;  anterior approach   ANTERIOR LAT LUMBAR FUSION Right 07/13/2019   Procedure: Right Lumbar 3-4 Lumbar 4-5 Anterolateral lumbar interbody fusion;  Surgeon: Unice Pac, MD;  Location: [facility] OR;  Service: Neurosurgery;  Laterality: Right;   ANTERIOR LUMBAR FUSION N/A 07/13/2019   Procedure: Lumbar Five-Sacral One Anterior lumbar interbody fusion;  Surgeon: Unice Pac, MD;  Location: [facility] OR;  Service: Neurosurgery;  Laterality: N/A;  Anterior approach   APPLICATION OF INTRAOPERATIVE CT SCAN N/A 04/24/2021   Procedure: APPLICATION OF INTRAOPERATIVE CT SCAN;  Surgeon: Dawley, Lani BROCKS, DO;  Location: [facility] OR;  Service: Neurosurgery;  Laterality: N/A;  3C/RM 21   CHOLECYSTECTOMY     COLONOSCOPY     CORONARY  STENT INTERVENTION N/A 10/01/2020   Procedure: CORONARY STENT INTERVENTION;  Surgeon: Elmira Newman PARAS, MD;  Location: [facility] INVASIVE CV LAB;  Service: Cardiovascular;  Laterality: N/A;   EYE SURGERY Bilateral    GALLBLADDER SURGERY     HEMORRHOID SURGERY  1983   LEFT HEART CATH AND CORONARY ANGIOGRAPHY N/A 09/17/2020   Procedure: LEFT HEART CATH AND CORONARY ANGIOGRAPHY;  Surgeon: Elmira Newman PARAS, MD;  Location: [facility] INVASIVE CV LAB;  Service: Cardiovascular;  Laterality: N/A;   LUMBAR LAMINECTOMY/DECOMPRESSION MICRODISCECTOMY N/A 10/02/2016   Procedure: L3 to S1 Laminectomy;  Surgeon: Unice Pac, MD;  Location: [facility] OR;  Service: Neurosurgery;  Laterality: N/A;  L3 to S1 Laminectomy   LUMBAR LAMINECTOMY/DECOMPRESSION MICRODISCECTOMY Bilateral 09/07/2019   Procedure: Bilateral Lumbar Five - Sacral One Foraminotomy;  Surgeon: Unice Pac, MD;  Location: [facility] OR;  Service: Neurosurgery;  Laterality: Bilateral;  posterior   LUMBAR PERCUTANEOUS PEDICLE SCREW 3 LEVEL N/A 07/13/2019   Procedure: Percutaneous pedicle screw fixation from Lumbar 3 to Sacral 1;  Surgeon: Unice Pac, MD;  Location: [facility] OR;  Service: Neurosurgery;  Laterality: N/A;   MENISCUS REPAIR Right    PROSTATE SURGERY     SHOULDER SURGERY Left 01/2011   rotator cuff    Assessment & Plan Clinical Impression: Patient is a 83 y.o. year old malewith history of chronic anemia, chronic back pain status post anterior lateral  lumbar fusion with radiculopathy, depression/insomnia, chronic orthostatic hypotension followed by [facility] neurology with frequent falls, hyperlipidemia, prostate cancer, CAD with stenting maintained on Plavix , type 2 diabetes mellitus with neuropathy, restless legs, patient does smoke cigars.  Per chart review patient lives with spouse.  1 level home.  Independent and driving prior to admission.  Presented 03/14/2023 after a fall while returning from the bathroom when he struck his head had a small laceration on the  back of his head.  Denied loss of consciousness.  Initial cranial CT scan showed no acute intracranial process.  Left occipital scalp hematoma.  CT cervical spine negative however MRI cervical spine showed cord edema/contusion at C3-5 and was placed in a cervical soft collar per neurosurgery..  CTA of head and neck showed no intracranial large vessel occlusion.  Demonstrated severe stenosis of the right PCA near the P1-P2 junction with the remainder of the right PCA being diminutive and poorly opacified.  Severe stenosis in the proximal left P2 with multifocal irregularity in the remainder of the left P2.  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  Moderate stenosis of the origin of the left vertebral artery and mild stenosis of the origin of the right vertebral artery.  CT of the chest abdomen and pelvis showed no acute intrathoracic intra-abdominal or intrapelvic traumatic injury.  Patient did receive TNK.  MRI of the brain follow-up showed no acute intracranial abnormalities finding of small vessel ischemia as well as noted posterior scalp subgaleal hematoma.  Admission chemistries were unremarkable except creatinine of 1.34, WBC 13,700, lactic acid 1.7, alcohol negative.  Patient was cleared for low-dose aspirin  81 mg but chronic Plavix  has been discontinued.  Hospital course monitoring of blood pressure maintained on ProAmatine .  Tolerating a regular consistency diet.  Therapy evaluations completed due to patient decreased functional mobility was admitted for a comprehensive rehab program.   Patient transferred to CIR on 03/23/2023 .   Patient currently requires  CGA/supervision  with mobility secondary to muscle weakness and decreased safety awareness .  Prior to hospitalization, patient was modified independent  with mobility and lived with Spouse in a House home.  Home access is  Other (comment) (threshold).  Patient will benefit from skilled PT intervention  to maximize safe functional mobility, minimize fall risk, and decrease caregiver burden for planned discharge home with intermittent assist.  Anticipate patient will benefit from follow up OP at discharge.  PT - End of Session Activity Tolerance: Tolerates 30+ min activity with multiple rests Endurance Deficit: Yes Endurance Deficit Description: decreased PT Assessment Rehab Potential (ACUTE/IP ONLY): Fair PT Barriers to Discharge: Incontinence;Behavior PT Barriers to Discharge Comments: safety awareness PT Patient demonstrates impairments in the following area(s): Safety;Motor PT Transfers Functional Problem(s): Bed Mobility;Bed to Chair;Car PT Locomotion Functional Problem(s): Ambulation;Stairs PT Plan PT Intensity: Minimum of 1-2 x/day ,45 to 90 minutes PT Frequency: 5 out of 7 days PT Duration Estimated Length of Stay: 5-7 days PT Treatment/Interventions: Ambulation/gait training;Discharge planning;DME/adaptive equipment instruction;Pain management;Functional mobility training;Psychosocial support;Therapeutic Activities;UE/LE Strength taining/ROM;Wheelchair propulsion/positioning;UE/LE Coordination activities;Therapeutic Exercise;Stair training;Skin care/wound management;Patient/family education;Neuromuscular re-education;Disease management/prevention;Community reintegration;Balance/vestibular training PT Transfers Anticipated Outcome(s): Mod I PT Locomotion Anticipated Outcome(s): Mod I PT Recommendation Recommendations for Other Services: None Follow Up Recommendations: Outpatient PT Patient destination: Home Equipment Recommended: To be determined Equipment Details: TBD   PT Evaluation Precautions/Restrictions Precautions Precautions: Fall Other Brace: soft cervical collar for comfort Restrictions Weight Bearing Restrictions Per Provider Order: No General   Vital Signs  Pain   Pain Interference Pain Interference Pain Effect on Sleep: 1. Rarely or not at all Pain  Interference with Therapy Activities: 1. Rarely or not at all Pain Interference with Day-to-Day Activities: 1. Rarely or not at all Home Living/Prior Functioning Home Living Available Help at Discharge: Family;Available PRN/intermittently Type of Home: House Home Access: Other (comment) (threshold) Entrance Stairs-Rails: None Home Layout: One level Bathroom Shower/Tub: Health Visitor: Standard Bathroom Accessibility: Yes  Lives With: Spouse Prior Function Level of Independence: Independent with basic ADLs;Requires assistive device for independence  Able to Take Stairs?: Yes Driving: Yes Vocation: Retired Museum/gallery Exhibitions Officer Overall Cognitive Status: Within Functional Limits for tasks assessed Arousal/Alertness: Awake/alert Orientation Level: Oriented X4 Memory: Appears intact Awareness: Appears intact Problem Solving: Appears intact Safety/Judgment: Appears intact Sensation Sensation Light Touch: Appears Intact Proprioception: Appears Intact Coordination Gross Motor Movements are Fluid and Coordinated: Yes Fine Motor Movements are Fluid and Coordinated: Yes Motor  Motor Motor: Within Functional Limits Motor - Skilled Clinical Observations: WFL- decreased posterior chain activation   Trunk/Postural Assessment  Cervical Assessment Cervical Assessment: Within Functional Limits Thoracic Assessment Thoracic Assessment: Within Functional Limits Lumbar Assessment Lumbar Assessment: Within Functional Limits Postural Control Postural Control: Within Functional Limits  Balance Balance Balance Assessed: Yes Static Sitting Balance Static Sitting - Balance Support: Feet supported;No upper extremity supported Static Sitting - Level of Assistance: 5: Stand by assistance (supervision) Dynamic Sitting Balance Dynamic Sitting - Balance Support: Feet supported;During functional activity Dynamic Sitting - Level of Assistance: 5: Stand by assistance  (supervision) Dynamic Sitting - Balance Activities: Reaching for objects Static Standing Balance Static Standing - Balance Support: Bilateral upper extremity supported Static Standing - Level of Assistance: 5: Stand by assistance (CGA) Dynamic Standing Balance Dynamic Standing - Balance Support: During functional activity Dynamic Standing - Level of Assistance: 5: Stand by assistance (CGA) Extremity Assessment      RLE Assessment RLE Assessment: Exceptions to [facility] General Strength Comments: grossly 4/5 LLE Assessment LLE Assessment: Exceptions to [facility] General Strength Comments: grossly 4/5  Care Tool Care Tool Bed Mobility Roll left and right activity   Roll left and right assist level: Supervision/Verbal cueing    Sit to lying activity   Sit to lying assist level: Supervision/Verbal cueing    Lying to sitting on side of bed activity   Lying to sitting on side of bed assist level: the ability to move from lying on the back to sitting on the side of the bed with no back support.: Supervision/Verbal cueing     Care Tool Transfers Sit to stand transfer   Sit to stand assist level: Supervision/Verbal cueing    Chair/bed transfer   Chair/bed transfer assist level: Supervision/Verbal cueing    Car transfer   Car transfer assist level: Supervision/Verbal cueing      Care Tool Locomotion Ambulation   Assist level: Contact Guard/Touching assist Assistive device: Walker-rolling Max distance: 20 ft  Walk 10 feet activity   Assist level: Contact Guard/Touching assist Assistive device: Walker-rolling   Walk 50 feet with 2 turns activity Walk 50 feet with 2 turns activity did not occur: Safety/medical concerns (BP)      Walk 150 feet activity Walk 150 feet activity did not occur: Safety/medical concerns      Walk 10 feet on uneven surfaces activity   Assist level: Contact Guard/Touching assist    Stairs Stair activity did not occur: Safety/medical concerns (BP)         Walk up/down  1 step activity Walk up/down 1 step or curb (drop down) activity did not occur: Safety/medical concerns      Walk up/down 4 steps activity Walk up/down 4 steps activity did not occur: Safety/medical concerns      Walk up/down 12 steps activity Walk up/down 12 steps activity did not occur: Safety/medical concerns      Pick up small objects from floor Pick up small object from the floor (from standing position) activity did not occur: Safety/medical concerns (BP)      Wheelchair Is the patient using a wheelchair?: Yes Type of Wheelchair: Manual   Wheelchair assist level: Supervision/Verbal cueing Max wheelchair distance: 200 ft  Wheel 50 feet with 2 turns activity   Assist Level: Supervision/Verbal cueing  Wheel 150 feet activity   Assist Level: Supervision/Verbal cueing    Refer to Care Plan for Long Term Goals  SHORT TERM GOAL WEEK 1 PT Short Term Goal 1 (Week 1): STG's=LTG's due to ELOS  Recommendations for other services: None   Skilled Therapeutic Intervention Mobility Bed Mobility Bed Mobility: Rolling Right;Rolling Left;Supine to Sit;Sit to Supine Rolling Right: Supervision/verbal cueing Rolling Left: Supervision/Verbal cueing Supine to Sit: Supervision/Verbal cueing Sit to Supine: Supervision/Verbal cueing Transfers Transfers: Sit to Stand;Stand to Sit;Stand Pivot Transfers Sit to Stand: Supervision/Verbal cueing Stand to Sit: Supervision/Verbal cueing Stand Pivot Transfers: Supervision/Verbal cueing Stand Pivot Transfer Details: Verbal cues for safe use of DME/AE;Verbal cues for precautions/safety Transfer (Assistive device): Rolling walker Locomotion  Gait Ambulation: Yes Gait Assistance: Contact Guard/Touching assist Gait Distance (Feet): 20 Feet Assistive device: Rolling walker Gait Gait: Yes Gait Pattern: Impaired Gait Pattern: Shuffle;Narrow base of support Gait velocity: reduced Stairs / Additional Locomotion Stairs: No Ramp:  Doctor, Hospital Mobility: Yes Wheelchair Assistance: Doctor, General Practice: Both upper extremities Wheelchair Parts Management: Needs assistance Distance: 200 ft   PT Evaluation & Treatment Session 1:   Pt agreeable to PT and oriented to [employer] and procedures, safety plan updated. Pt without reports of pain, nurse administered pain medication in session. Pt largely limited 2/2 to asymptomatic orthostatic hypotension with use of ted hose and 6 inch ace wrap for BP management. Pt supervision with bed mobility and sit<>stand transfers with RW with cues for AD management. PT updated medical team and OT regarding hypotension in standing position for prolonged periods. Pt left semi-reclined in bed with all needs in reach and alarm on.    Treatment Session 2:   Pt agreeable to PT session and without reports of pain. Pt BP WFL but pt limited due to pre-syncopal symptoms (blurred vision, lightheadedness, and dizziness) with mobility. Pt supervision with bed mobility, transfers (increased time) with RW, w/c propulsion ~200 ft prior to initiation of car transfer and ramp negotiation (supervision). Pt with pre-syncopal episode and educed on importance of fluid intake and physical activity prior to positional changes for BP management. Pt performed remainder of session from seated position and PT asked MD and PA if abdominal binder would be appropriate. Pt performed kinetron at 40 cm/sec 3 x 2.5 minutes with education on RPE and energy conservation. Commenced session with education regarding performance of ankle pumps, LAQ's, and marches prior to mobility for BP management. Pt left seated at bedside with all needs in reach and alarm on.     Discharge Criteria: Patient will be discharged from PT if patient refuses treatment 3 consecutive times without medical reason, if treatment goals not met, if there is a change in medical status,  if patient makes no progress towards goals or if patient is discharged from hospital.  The above assessment, treatment plan, treatment alternatives and goals were discussed and mutually agreed upon: by patient  Bevely Lewanda Bevely Lewanda PT, DPT  03/24/2023, 6:49 PM

## 2023-03-24 NOTE — Plan of Care (Signed)
  Problem: Consults Goal: RH SPINAL CORD INJURY PATIENT EDUCATION Description:  See Patient Education module for education specifics.  Outcome: Progressing   Problem: SCI BOWEL ELIMINATION Goal: RH STG MANAGE BOWEL WITH ASSISTANCE Description: STG Manage Bowel with mod I Assistance. Outcome: Progressing Goal: RH STG SCI MANAGE BOWEL WITH MEDICATION WITH ASSISTANCE Description: STG SCI Manage bowel with medication with mod I assistance. Outcome: Progressing   Problem: SCI BLADDER ELIMINATION Goal: RH STG MANAGE BLADDER WITH ASSISTANCE Description: STG Manage Bladder Without Assistance Outcome: Progressing   Problem: RH SKIN INTEGRITY Goal: RH STG SKIN FREE OF INFECTION/BREAKDOWN Description: Manage skin independently Outcome: Progressing   Problem: RH SAFETY Goal: RH STG ADHERE TO SAFETY PRECAUTIONS W/ASSISTANCE/DEVICE Description: STG Adhere to Safety Precautions With  moderate Assistance/Device. Outcome: Progressing   Problem: RH KNOWLEDGE DEFICIT SCI Goal: RH STG INCREASE KNOWLEDGE OF SELF CARE AFTER SCI Outcome: Progressing

## 2023-03-24 NOTE — Progress Notes (Signed)
 PROGRESS NOTE   Subjective/Complaints:   Pt reports LBM this AM Denies pain Slept well- no issues ROS:  Pt denies SOB, abd pain, CP, N/V/C/D, and vision changes   Objective:   No results found. Recent Labs    03/23/23 0523 03/24/23 0517  WBC 6.5 5.6  HGB 8.2* 8.2*  HCT 24.5* 24.0*  PLT 368 389   Recent Labs    03/23/23 0523 03/24/23 0517  NA 136 135  K 3.6 3.8  CL 101 98  CO2 25 27  GLUCOSE 181* 207*  BUN 16 16  CREATININE 1.01 1.14  CALCIUM  8.4* 8.6*    Intake/Output Summary (Last 24 hours) at 03/24/2023 1935 Last data filed at 03/24/2023 1805 Gross per 24 hour  Intake 716 ml  Output 550 ml  Net 166 ml        Physical Exam: Vital Signs Blood pressure 121/64, pulse 70, temperature (!) 97.5 F (36.4 C), temperature source Oral, resp. rate 16, height 6' 2 (1.88 m), weight 74.8 kg, SpO2 97%.   General: awake, alert, appropriate, sitting up in bed; NAD HENT: conjugate gaze; oropharynx moist- abrasion on face and eczema on face/head CV: regular rate; no JVD Pulmonary: CTA B/L; no W/R/R- good air movement GI: soft, NT, ND, (+)BS Psychiatric: appropriate Neurological: Ox3 Musculoskeletal:     Cervical back: Neck supple. No tenderness.     Comments: RUE- deltoid 4+/5; Biceps 4+/5; Triceps 4/5; WE 4+/5; Grip 5-/5 and FA 4+/5 LUE 5/5 in same muscles RLE- 5-/5 throughout LLE- 5/5   Skin:    General: Skin is warm and dry.  Neurological:     Comments: Patient is awake alert follows commands.  Oriented to person place and time. No clonus; no hoffmans and no increased tone in Ue's and LE's.   Assessment/Plan: 1. Functional deficits which require 3+ hours per day of interdisciplinary therapy in a comprehensive inpatient rehab setting. Physiatrist is providing close team supervision and 24 hour management of active medical problems listed below. Physiatrist and rehab team continue to assess barriers to  discharge/monitor patient progress toward functional and medical goals  Care Tool:  Bathing    Body parts bathed by patient: Right arm, Left lower leg, Face, Left arm, Chest, Abdomen, Front perineal area, Buttocks, Right upper leg, Left upper leg, Right lower leg         Bathing assist Assist Level: Supervision/Verbal cueing     Upper Body Dressing/Undressing Upper body dressing   What is the patient wearing?: Pull over shirt    Upper body assist Assist Level: Supervision/Verbal cueing    Lower Body Dressing/Undressing Lower body dressing      What is the patient wearing?: Underwear/pull up, Pants     Lower body assist Assist for lower body dressing: Supervision/Verbal cueing     Toileting Toileting    Toileting assist Assist for toileting: Contact Guard/Touching assist     Transfers Chair/bed transfer  Transfers assist     Chair/bed transfer assist level: Supervision/Verbal cueing     Locomotion Ambulation   Ambulation assist      Assist level: Contact Guard/Touching assist Assistive device: Walker-rolling Max distance: 20 ft   Walk 10  feet activity   Assist     Assist level: Contact Guard/Touching assist Assistive device: Walker-rolling   Walk 50 feet activity   Assist Walk 50 feet with 2 turns activity did not occur: Safety/medical concerns (BP)         Walk 150 feet activity   Assist Walk 150 feet activity did not occur: Safety/medical concerns         Walk 10 feet on uneven surface  activity   Assist     Assist level: Contact Guard/Touching assist     Wheelchair     Assist Is the patient using a wheelchair?: Yes Type of Wheelchair: Manual    Wheelchair assist level: Supervision/Verbal cueing Max wheelchair distance: 200 ft    Wheelchair 50 feet with 2 turns activity    Assist        Assist Level: Supervision/Verbal cueing   Wheelchair 150 feet activity     Assist      Assist Level:  Supervision/Verbal cueing   Blood pressure 121/64, pulse 70, temperature (!) 97.5 F (36.4 C), temperature source Oral, resp. rate 16, height 6' 2 (1.88 m), weight 74.8 kg, SpO2 97%.  Medical Problem List and Plan: 1. 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  Soft cervical collar for comfort.             -patient may  shower             -ELOS/Goals: 8-11 days- supervision  First day of evaluations- con't CIR PT and OT 2.  Antithrombotics: -DVT/anticoagulation:  Mechanical: Antiembolism stockings, thigh (TED hose) Bilateral lower extremities             -antiplatelet therapy: Aspirin  81 mg daily 3. Pain Management/chronic back pain: Neurontin  100 mg nightly, Cymbalta  30 mg daily  1/8- denies pain- con't regimen 4. Mood/Behavior/Sleep: Provide emotional support             -antipsychotic agents: N/A 5. Neuropsych/cognition: This patient is capable of making decisions on his own behalf. 6. Skin/Wound Care: Routine skin checks 7. Fluids/Electrolytes/Nutrition: Routine in and outs with follow-up chemistries 8.  History of orthostatic hypotension.  ProAmatine  5 mg 3 times daily.  Patient followed by [facility] neurology  1/8- will monitor BP closely since had fall at home, likely due to low BP.  9.  Restless leg syndrome.  Requip  4 mg nightly 10.  Hyperlipidemia.  Crestor  11.  Diabetes mellitus with peripheral neuropathy.  Hemoglobin A1c 7.5.  Currently on SSI.  PTA patient on Trulicity  3 mg every Wednesday, Semglee 18 units daily, Tresiba FlexTouch weekly, Glucophage  1000 mg twice daily.  Resume as needed  1/8- CBGs in high 100s to 200's- will restart Metformin  500mg  BID-.  12.  AKI.  Follow-up chemistries  1/8- has resolved 13.  CAD with stenting.  Plavix  currently on hold and aspirin  has been initiated. 14.  Chronic anemia.  Continue iron  supplement.  Follow-up CBC 15.  History of tobacco use.  Provide counseling 16. Droplet precautions- has been in hospital since 12/29- on droplet precautions the entire time- no coughing, cold S'xs- will send if can remove  1/8- off droplet precautions         LOS: 1 days A FACE TO FACE EVALUATION WAS PERFORMED  Harry Zamora 03/24/2023, 7:35 PM

## 2023-03-24 NOTE — Progress Notes (Signed)
 Inpatient Rehabilitation  Patient information reviewed and entered into eRehab system by [facility]. Karen Kays., CCC/SLP, PPS Coordinator.  Information including medical coding, functional ability and quality indicators will be reviewed and updated through discharge.

## 2023-03-24 NOTE — Progress Notes (Addendum)
 Inpatient Rehabilitation Admission Medication Review by a Pharmacist  A complete drug regimen review was completed for this patient to identify any potential clinically significant medication issues.  High Risk Drug Classes Is patient taking? Indication by Medication  Antipsychotic No   Anticoagulant No SCDs/antiembolism stockings for VTE prophylaxis  Antibiotic No   Opioid No   Antiplatelet Yes Aspirin  81 mg - CVA prophylaxis, CAD with prior stent  Hypoglycemics/insulin  Yes SSI - DM Type 2  Vasoactive Medication Yes Midodrine  - hypotension  Chemotherapy No   Other Yes Duloxetine , Gabapentin  - neuropathic pain Pantoprazole  - GERD Ropinirole  - restless leg  Rosuvastatin  - hyperlipidemia Ferrous sulfate , Vitamin B-12 - supplements  PRNs: Acetaminophen  - mild pain Duonebs - wheezing, shortness of breath Senna-docusate - constipation     Type of Medication Issue Identified Description of Issue Recommendation(s)  Drug Interaction(s) (clinically significant)     Duplicate Therapy     Allergy     No Medication Administration End Date     Incorrect Dose     Additional Drug Therapy Needed     Significant med changes from prior encounter (inform family/care partners about these prior to discharge). Plavix  discontinued > now Aspirin . Prazosin  and sildenafil discontinued. On Midodrine . Rosuvastatin  dose decreased (low LDL)  Off prior Trulicity , Tresiba and Metformin .  Communicate changes with patient/family prior to discharge.    SSI for now, monitoring CBGs.  Other  Per dc summary, to resume supplments: vitamin C , Calcium  + D, Krill oil, lysine. Resume at discharge.    Clinically significant medication issues were identified that warrant physician communication and completion of prescribed/recommended actions by midnight of the next day:  No  Pharmacist comments:   Time spent performing this drug regimen review (minutes):  [address]   Genaro Zebedee Calin, [place] 03/24/2023  11:38 AM

## 2023-03-24 NOTE — Plan of Care (Signed)
  Problem: RH Grooming Goal: LTG Patient will perform grooming w/assist,cues/equip (OT) Description: LTG: Patient will perform grooming with assist, with/without cues using equipment (OT) Flowsheets (Taken 03/24/2023 1235) LTG: Pt will perform grooming with assistance level of: Independent with assistive device    Problem: RH Bathing Goal: LTG Patient will bathe all body parts with assist levels (OT) Description: LTG: Patient will bathe all body parts with assist levels (OT) Flowsheets (Taken 03/24/2023 1235) LTG: Pt will perform bathing with assistance level/cueing: Independent with assistive device    Problem: RH Dressing Goal: LTG Patient will perform upper body dressing (OT) Description: LTG Patient will perform upper body dressing with assist, with/without cues (OT). Flowsheets (Taken 03/24/2023 1235) LTG: Pt will perform upper body dressing with assistance level of: Independent with assistive device Goal: LTG Patient will perform lower body dressing w/assist (OT) Description: LTG: Patient will perform lower body dressing with assist, with/without cues in positioning using equipment (OT) Flowsheets (Taken 03/24/2023 1235) LTG: Pt will perform lower body dressing with assistance level of: Independent with assistive device   Problem: RH Toileting Goal: LTG Patient will perform toileting task (3/3 steps) with assistance level (OT) Description: LTG: Patient will perform toileting task (3/3 steps) with assistance level (OT)  Flowsheets (Taken 03/24/2023 1235) LTG: Pt will perform toileting task (3/3 steps) with assistance level: Independent with assistive device   Problem: RH Toilet Transfers Goal: LTG Patient will perform toilet transfers w/assist (OT) Description: LTG: Patient will perform toilet transfers with assist, with/without cues using equipment (OT) Flowsheets (Taken 03/24/2023 1235) LTG: Pt will perform toilet transfers with assistance level of: Independent with assistive device    Problem: RH Tub/Shower Transfers Goal: LTG Patient will perform tub/shower transfers w/assist (OT) Description: LTG: Patient will perform tub/shower transfers with assist, with/without cues using equipment (OT) Flowsheets (Taken 03/24/2023 1235) LTG: Pt will perform tub/shower stall transfers with assistance level of: Independent with assistive device

## 2023-03-24 NOTE — Evaluation (Signed)
 Occupational Therapy Assessment and Plan  Patient Details  Name: Harry Zamora MRN: [id number] Date of Birth: 09/05/1940  OT Diagnosis: muscle weakness (generalized) Rehab Potential: Rehab Potential (ACUTE ONLY): Good ELOS: 7-10 days   Today's Date: 03/24/2023 OT Individual Time: 1050-1203 OT Individual Time Calculation (min): 73 min     Hospital Problem: Principal Problem:   Contusion of cervical cord, initial encounter (HCC)   Past Medical History:  Past Medical History:  Diagnosis Date   Anemia    as an infant   Arthritis    Back pain    Depression    Disc displacement, lumbar    Gynecomastia    History of kidney stones    Hyperlipidemia    Hypertension    no longer on medications   Hypertriglyceridemia    Insomnia    Low back pain    Lumbar radiculopathy    Lumbar stenosis    OA (osteoarthritis)    Polyneuropathy in diabetes(357.2)    Prostate cancer (HCC) 2003   Restless legs    Ringing in ears    RLS (restless legs syndrome)    Type 2 diabetes mellitus (HCC)    Past Surgical History:  Past Surgical History:  Procedure Laterality Date   ABDOMINAL EXPOSURE N/A 07/13/2019   Procedure: ABDOMINAL EXPOSURE;  Surgeon: Serene Gaile LELON, MD;  Location: [facility] OR;  Service: Vascular;  Laterality: N/A;  anterior approach   ANTERIOR LAT LUMBAR FUSION Right 07/13/2019   Procedure: Right Lumbar 3-4 Lumbar 4-5 Anterolateral lumbar interbody fusion;  Surgeon: Unice Pac, MD;  Location: [facility] OR;  Service: Neurosurgery;  Laterality: Right;   ANTERIOR LUMBAR FUSION N/A 07/13/2019   Procedure: Lumbar Five-Sacral One Anterior lumbar interbody fusion;  Surgeon: Unice Pac, MD;  Location: [facility] OR;  Service: Neurosurgery;  Laterality: N/A;  Anterior approach   APPLICATION OF INTRAOPERATIVE CT SCAN N/A 04/24/2021   Procedure: APPLICATION OF INTRAOPERATIVE CT SCAN;  Surgeon: Dawley, Lani BROCKS, DO;  Location: [facility] OR;  Service: Neurosurgery;  Laterality: N/A;  3C/RM 21   CHOLECYSTECTOMY      COLONOSCOPY     CORONARY STENT INTERVENTION N/A 10/01/2020   Procedure: CORONARY STENT INTERVENTION;  Surgeon: Elmira Newman PARAS, MD;  Location: [facility] INVASIVE CV LAB;  Service: Cardiovascular;  Laterality: N/A;   EYE SURGERY Bilateral    GALLBLADDER SURGERY     HEMORRHOID SURGERY  1983   LEFT HEART CATH AND CORONARY ANGIOGRAPHY N/A 09/17/2020   Procedure: LEFT HEART CATH AND CORONARY ANGIOGRAPHY;  Surgeon: Elmira Newman PARAS, MD;  Location: [facility] INVASIVE CV LAB;  Service: Cardiovascular;  Laterality: N/A;   LUMBAR LAMINECTOMY/DECOMPRESSION MICRODISCECTOMY N/A 10/02/2016   Procedure: L3 to S1 Laminectomy;  Surgeon: Unice Pac, MD;  Location: [facility] OR;  Service: Neurosurgery;  Laterality: N/A;  L3 to S1 Laminectomy   LUMBAR LAMINECTOMY/DECOMPRESSION MICRODISCECTOMY Bilateral 09/07/2019   Procedure: Bilateral Lumbar Five - Sacral One Foraminotomy;  Surgeon: Unice Pac, MD;  Location: [facility] OR;  Service: Neurosurgery;  Laterality: Bilateral;  posterior   LUMBAR PERCUTANEOUS PEDICLE SCREW 3 LEVEL N/A 07/13/2019   Procedure: Percutaneous pedicle screw fixation from Lumbar 3 to Sacral 1;  Surgeon: Unice Pac, MD;  Location: [facility] OR;  Service: Neurosurgery;  Laterality: N/A;   MENISCUS REPAIR Right    PROSTATE SURGERY     SHOULDER SURGERY Left 01/2011   rotator cuff    Assessment & Plan Clinical Impression:  Harry Zamora is a 83 year old right-handed male with history of chronic anemia, chronic back pain status  post anterior lateral lumbar fusion with radiculopathy, depression/insomnia, chronic orthostatic hypotension followed by [facility] neurology with frequent falls, hyperlipidemia, prostate cancer, CAD with stenting maintained on Plavix , type 2 diabetes mellitus with neuropathy, restless legs, patient does smoke cigars.  Per chart review patient lives with spouse.  1 level home.  Independent and driving prior to admission.  Presented 03/14/2023 after a fall while returning from the bathroom when he  struck his head had a small laceration on the back of his head.  Denied loss of consciousness.  Initial cranial CT scan showed no acute intracranial process.  Left occipital scalp hematoma.  CT cervical spine negative however MRI cervical spine showed cord edema/contusion at C3-5 and was placed in a cervical soft collar per neurosurgery..  CTA of head and neck showed no intracranial large vessel occlusion.  Demonstrated severe stenosis of the right PCA near the P1-P2 junction with the remainder of the right PCA being diminutive and poorly opacified.  Severe stenosis in the proximal left P2 with multifocal irregularity in the remainder of the left P2.  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  Moderate stenosis of the origin of the left vertebral artery and mild stenosis of the origin of the right vertebral artery.  CT of the chest abdomen and pelvis showed no acute intrathoracic intra-abdominal or intrapelvic traumatic injury.  Patient did receive TNK.  MRI of the brain follow-up showed no acute intracranial abnormalities finding of small vessel ischemia as well as noted posterior scalp subgaleal hematoma.  Admission chemistries were unremarkable except creatinine of 1.34, WBC 13,700, lactic acid 1.7, alcohol negative.  Patient was cleared for low-dose aspirin  81 mg but chronic Plavix  has been discontinued.  Hospital course monitoring of blood pressure maintained on ProAmatine .  Tolerating a regular consistency diet.  Therapy evaluations completed due to patient decreased functional mobility was admitted for a comprehensive rehab program. .  Patient transferred to CIR on 03/23/2023 .    Patient currently requires min with basic self-care skills secondary to muscle weakness, decreased cardiorespiratoy endurance, unbalanced muscle activation, and decreased standing balance and decreased balance strategies.  Prior to hospitalization, patient could complete ADLs with  modified independent .  Patient will benefit from skilled intervention to decrease level of assist with basic self-care skills prior to discharge.  Anticipate patient will require intermittent supervision and follow up home health.  OT - End of Session Activity Tolerance: Tolerates 30+ min activity with multiple rests Endurance Deficit: Yes OT Assessment Rehab Potential (ACUTE ONLY): Good OT Barriers to Discharge: Incontinence OT Patient demonstrates impairments in the following area(s): Balance;Endurance;Motor;Safety;Sensory OT Basic ADL's Functional Problem(s): Grooming;Bathing;Toileting;Dressing OT Transfers Functional Problem(s): Toilet OT Additional Impairment(s): None OT Plan OT Intensity: Minimum of 1-2 x/day, 45 to 90 minutes OT Frequency: 5 out of 7 days OT Duration/Estimated Length of Stay: 7-10 days OT Treatment/Interventions: Balance/vestibular training;Disease mangement/prevention;Neuromuscular re-education;Self Care/advanced ADL retraining;Therapeutic Exercise;Wheelchair propulsion/positioning;Cognitive remediation/compensation;DME/adaptive equipment instruction;Pain management;Skin care/wound managment;UE/LE Strength taining/ROM;Community reintegration;Functional electrical stimulation;Patient/family education;UE/LE Coordination activities;Discharge planning;Functional mobility training;Therapeutic Activities;Psychosocial support;Visual/perceptual remediation/compensation OT Self Feeding Anticipated Outcome(s): mod I OT Basic Self-Care Anticipated Outcome(s): Mod I OT Toileting Anticipated Outcome(s): Mod I OT Bathroom Transfers Anticipated Outcome(s): Mod I OT Recommendation Recommendations for Other Services: Therapeutic Recreation consult Therapeutic Recreation Interventions: Pet therapy Patient destination: Home Follow Up Recommendations: Home health OT Equipment Details: owns shower chair with back, cane and scooter, and rollator   OT  Evaluation Precautions/Restrictions  Precautions Precautions: Fall Other Brace: soft cervical collar for  comfort Restrictions Weight Bearing Restrictions Per Provider Order: No General Chart Reviewed: Yes Family/Caregiver Present: No Vital Signs Therapy Vitals BP: (!) 104/54 Patient Position (if appropriate): Sitting Oxygen Therapy SpO2: 95 % O2 Device: Room Air Pain Pain Assessment Pain Scale: 0-10 Pain Score: 0-No pain Home Living/Prior Functioning Home Living Family/patient expects to be discharged to:: Private residence Living Arrangements: Spouse/significant other Available Help at Discharge: Family, Available PRN/intermittently Type of Home: House Home Layout: One level Bathroom Shower/Tub: Walk-in shower (grab bars and chair with back) Bathroom Toilet: Standard Bathroom Accessibility: Yes Additional Comments: taking dog for walk every day  Lives With: Spouse IADL History Homemaking Responsibilities: Yes Meal Prep Responsibility: Secondary Laundry Responsibility: Secondary Cleaning Responsibility: Secondary Bill Paying/Finance Responsibility: Secondary Shopping Responsibility: Secondary Child Care Responsibility: No Current License: Yes Mode of Transportation: [place] Occupation: Retired Leisure and Hobbies: taking care of dog, research scientist (life sciences) Prior Function Level of Independence: Independent with basic ADLs, Requires assistive device for independence (used cane)  Able to Take Stairs?: Yes Driving: Yes Vocation: Retired Leisure: Hobbies-yes (Comment) Vision Baseline Vision/History: 1 Wears glasses Ability to See in Adequate Light: 0 Adequate Vision Assessment?: No apparent visual deficits;Wears glasses for reading;Wears glasses for driving Perception  Perception: Within Functional Limits Praxis Praxis: WFL Cognition Cognition Overall Cognitive Status: Within Functional Limits for tasks assessed Arousal/Alertness: Awake/alert Orientation Level:  Person;Situation;Place Person: Oriented Place: Oriented Situation: Oriented Memory: Appears intact Awareness: Appears intact Problem Solving: Appears intact Safety/Judgment: Appears intact Brief Interview for Mental Status (BIMS) Repetition of Three Words (First Attempt): 3 Temporal Orientation: Year: Correct Temporal Orientation: Month: Accurate within 5 days Temporal Orientation: Day: Incorrect Recall: Sock: Yes, no cue required Recall: Blue: Yes, no cue required Recall: Bed: Yes, after cueing (a piece of furniture) BIMS Summary Score: 13 Sensation Sensation Light Touch: Appears Intact Hot/Cold: Appears Intact Proprioception: Appears Intact Stereognosis: Not tested Coordination Gross Motor Movements are Fluid and Coordinated: Yes Fine Motor Movements are Fluid and Coordinated: Yes Motor     Trunk/Postural Assessment  Cervical Assessment Cervical Assessment: Within Functional Limits Thoracic Assessment Thoracic Assessment: Within Functional Limits Lumbar Assessment Lumbar Assessment: Within Functional Limits Postural Control Postural Control: Within Functional Limits  Balance Balance Balance Assessed: Yes Static Sitting Balance Static Sitting - Balance Support: Feet supported;No upper extremity supported Static Sitting - Level of Assistance: 5: Stand by assistance Dynamic Sitting Balance Dynamic Sitting - Balance Support: Feet supported;During functional activity Dynamic Sitting - Level of Assistance: 5: Stand by assistance Dynamic Sitting - Balance Activities: Reaching for objects Static Standing Balance Static Standing - Balance Support: Bilateral upper extremity supported Static Standing - Level of Assistance: 4: Min assist (CGA) Dynamic Standing Balance Dynamic Standing - Balance Support: During functional activity Dynamic Standing - Level of Assistance: 4: Min assist Extremity/Trunk Assessment RUE Assessment RUE Assessment: Within Functional  Limits Active Range of Motion (AROM) Comments: WFL General Strength Comments: reports numbness in RUE, strength WFL 4+/5 LUE Assessment LUE Assessment: Within Functional Limits Active Range of Motion (AROM) Comments: WFL General Strength Comments: 4+/5 overall  Care Tool Care Tool Self Care Eating   Eating Assist Level: Set up assist    Oral Care    Oral Care Assist Level: Supervision/Verbal cueing    Bathing   Body parts bathed by patient: Right arm;Left lower leg;Face;Left arm;Chest;Abdomen;Front perineal area;Buttocks;Right upper leg;Left upper leg;Right lower leg     Assist Level: Supervision/Verbal cueing    Upper Body Dressing(including orthotics)   What is the patient wearing?: Pull over shirt   Assist  Level: Supervision/Verbal cueing    Lower Body Dressing (excluding footwear)   What is the patient wearing?: Underwear/pull up;Pants Assist for lower body dressing: Supervision/Verbal cueing    Putting on/Taking off footwear   What is the patient wearing?: Socks;Ted hose Assist for footwear: Supervision/Verbal cueing       Care Tool Toileting Toileting activity   Assist for toileting: Contact Guard/Touching assist     Care Tool Bed Mobility Roll left and right activity   Roll left and right assist level: Supervision/Verbal cueing    Sit to lying activity   Sit to lying assist level: Supervision/Verbal cueing    Lying to sitting on side of bed activity   Lying to sitting on side of bed assist level: the ability to move from lying on the back to sitting on the side of the bed with no back support.: Supervision/Verbal cueing     Care Tool Transfers Sit to stand transfer   Sit to stand assist level: Supervision/Verbal cueing    Chair/bed transfer   Chair/bed transfer assist level: Supervision/Verbal cueing     Toilet transfer   Assist Level: Supervision/Verbal cueing     Care Tool Cognition  Expression of Ideas and Wants Expression of Ideas and Wants:  4. Without difficulty (complex and basic) - expresses complex messages without difficulty and with speech that is clear and easy to understand  Understanding Verbal and Non-Verbal Content Understanding Verbal and Non-Verbal Content: 4. Understands (complex and basic) - clear comprehension without cues or repetitions   Memory/Recall Ability Memory/Recall Ability : Current season;Location of own room;That he or she is in a hospital/hospital unit   Refer to Care Plan for Long Term Goals  SHORT TERM GOAL WEEK 1 OT Short Term Goal 1 (Week 1): STG=LTG d/t ELOS  Recommendations for other services: Therapeutic Recreation  Pet therapy   Skilled Therapeutic Intervention ADL ADL Eating: Set up Where Assessed-Eating: Bed level Grooming: Supervision/safety Where Assessed-Grooming: Sitting at sink Upper Body Bathing: Supervision/safety Where Assessed-Upper Body Bathing: Shower Lower Body Bathing: Supervision/safety Where Assessed-Lower Body Bathing: Shower Upper Body Dressing: Supervision/safety Where Assessed-Upper Body Dressing: Edge of bed Lower Body Dressing: Contact guard Where Assessed-Lower Body Dressing: Standing at sink Toileting: Contact guard Where Assessed-Toileting: Teacher, Adult Education: Furniture Conservator/restorer Method: Ambulating Tub/Shower Transfer: Scientific Laboratory Technician Method: Ship Broker: Walk in shower;Grab Clinical Research Associate: Administrator, Arts Method: Designer, Industrial/product: Shower seat with back Mobility  Bed Mobility Bed Mobility: Supine to Sit Supine to Sit: Supervision/Verbal cueing Transfers Sit to Stand: Supervision/Verbal cueing Stand to Sit: Supervision/Verbal cueing  1:1 evaluation and treatment session initiated this date. OT roles, goals and purpose discussed with pt as well as therapy schedule. ADL completed this date with levels of assist listed above.  Pt with TEDs and ACE wraps on at beginning of session d/t BP. BP checked after completing supine>EOB noted above, pt asymptomatic.Pt reported no symptoms of OH during session or bathing. Pt returned to bed after session with legs elevated for BP management. Pt would benefit from skilled OT in IPR setting in order to maximize independence with ADLs upon D/C.    Discharge Criteria: Patient will be discharged from OT if patient refuses treatment 3 consecutive times without medical reason, if treatment goals not met, if there is a change in medical status, if patient makes no progress towards goals or if patient is discharged from hospital.  The above assessment, treatment plan, treatment alternatives  and goals were discussed and mutually agreed upon: by patient  Camie Hoe, OTD, OTR/L 03/24/2023, 12:34 PM

## 2023-03-25 DIAGNOSIS — S14109A Unspecified injury at unspecified level of cervical spinal cord, initial encounter: Secondary | ICD-10-CM | POA: Diagnosis not present

## 2023-03-25 LAB — GLUCOSE, CAPILLARY
Glucose-Capillary: 194 mg/dL — ABNORMAL HIGH (ref 70–99)
Glucose-Capillary: 258 mg/dL — ABNORMAL HIGH (ref 70–99)
Glucose-Capillary: 102 mg/dL — ABNORMAL HIGH (ref 70–99)
Glucose-Capillary: 205 mg/dL — ABNORMAL HIGH (ref 70–99)

## 2023-03-25 NOTE — Progress Notes (Signed)
 Physical Therapy Session Note  Patient Details  Name: DEVAUGHN SAVANT MRN: [id number] Date of Birth: 1940/05/06  Today's Date: 03/25/2023 PT Individual Time: 1300-1341 PT Individual Time Calculation (min): 41 min  and Today's Date: 03/25/2023 PT Missed Time: 34 Minutes Missed Time Reason: Patient fatigue  Short Term Goals: Week 1:  PT Short Term Goal 1 (Week 1): STG's=LTG's due to ELOS  Skilled Therapeutic Interventions/Progress Updates:      Therapy Documentation Precautions:  Precautions Precautions: Fall Other Brace: soft cervical collar for comfort Restrictions Weight Bearing Restrictions Per Provider Order: No  Pt agreeable to PT session with emphasis on posterior chain activation and balance training. Pt without reports of pain and denies dizziness. Supervision with bed mobility and CGA with stand pivot, no AD, to w/c. W/c transport dependent to dayroom and pt performed following strength and balance exercises CGA/min A:   -squats 3 x 5   -split stance squats (bilateral directions) 3 x 8   -squats with green thera band to increase glut medius activity x 8 Pt reported dizziness and fatigue and requested to end session. Pt left semi-reclined in bed with all needs in reach, alarm on.    Therapy/Group: Individual Therapy  Bevely Fonder Bevely Fonder PT, DPT  03/25/2023, 1:15 PM

## 2023-03-25 NOTE — Progress Notes (Signed)
 Physical Therapy Session Note  Patient Details  Name: Harry Zamora MRN: [id number] Date of Birth: May 08, 1940  Today's Date: 03/25/2023 PT Individual Time: 1300 (Simultaneous filing. User may not have seen previous data.)-1341 (Simultaneous filing. User may not have seen previous data.) PT Individual Time Calculation (min): 41 min (Simultaneous filing. User may not have seen previous data.)   Short Term Goals: Week 1:  PT Short Term Goal 1 (Week 1): STG's=LTG's due to ELOS  Skilled Therapeutic Interventions/Progress Updates: Pt presented in w/c agreeable to therapy. Pt denies pain at start of session. Session focused on general conditioning, BLE strengthening, and balance. Pt propelled w/c to day room with supervision for increased cardiovascular workout. Completed ambulatory transfer to high/low mat with RW and CGA. Pt then participated in Sit to stand 2 x 10 with focus on eccentric control. Pt also participated in dynamic balance and weight shifting performing toe taps to 4in step without AD 2 x 10. Pt noted to demonstrate mild posteriorly bias more evident when placing LLE on step. Pt then progressed to standing on Airex performing reaching task pulling squigz of mirror for increased ankle strategy. Pt was able to complete task without AD and no posterior LOB. After brief seated rest pt stood on Airex to perform additional balance challenge with pt able to stand initially then noted significant posterior lean to which pt was unable to correct despit max multimodal cues. PTA then provided heavy HHA to step off Airex to go back to mat. BP checked due to possible OH however WNL as noted below. Airex mat then moved closer to high/low mat for safety. Pt then completed alternating shoulder flexion      Therapy Documentation Precautions:  Precautions Precautions: Fall Other Brace: soft cervical collar for comfort Restrictions Weight Bearing Restrictions Per Provider Order: No  Vital Signs: Therapy  Vitals Temp: 98 F (36.7 C) Temp Source: Oral Pulse Rate: 73 Resp: 17 BP: (!) 117/96 Patient Position (if appropriate): Lying Oxygen Therapy SpO2: 98 % O2 Device: Room Air Pain:   Mobility:   Locomotion :    Trunk/Postural Assessment :    Balance:   Exercises:   Other Treatments:      Therapy/Group: Individual Therapy  Mckaylin Bastien 03/25/2023, 3:48 PM

## 2023-03-25 NOTE — Progress Notes (Signed)
 Occupational Therapy Session Note  Patient Details  Name: Harry Zamora MRN: [id number] Date of Birth: 18-May-1940  Today's Date: 03/25/2023 OT Individual Time: 9199-9084 OT Individual Time Calculation (min): 75 min    Short Term Goals: Week 1:  OT Short Term Goal 1 (Week 1): STG=LTG d/t ELOS  Skilled Therapeutic Interventions/Progress Updates:      Therapy Documentation Precautions:  Precautions Precautions: Fall Other Brace: soft cervical collar for comfort Restrictions Weight Bearing Restrictions Per Provider Order: No General: "I slept real good last night." Pt supine in bed upon OT arrival, agreeable to OT session. Dr Cornelio ordering abdominal binder for BP management  Vital Signs: 108/60 at EOB after bed mobility with ACE wraps and TEDs  Pain: no pain reported  ADL: Bed mobility: SBA using bed rails supine>EOB Grooming/oral hygiene: SBA seated at sink completing shaving with electric razor and brushing teeth, able to manage container and squeeze toothpaste Toilet transfer: SBA with RW ambulating ~10 ft from sink to standard toilet, using grab bars to rise into standing Toileting: no void, incontinent episode of urine, SBA to manage pants  UB dressing: SBA seated in chair for donning/doffing overhead shirt LB dressing: SBA seated to manage pants over feet and standing at RW to manage over waist. Total A to manage ACE wraps and TED hose Transfers: Pt completing mobility within room SBA at RW, pt able to roll in W/C from room>therapy gym   Exercises: Pt completed 10 minutes of nu step bike in order to increase BUE/BLEstrength and endurance in preparation for increased independence in ADLs such as bathing. PRN rest breaks on level 5 resistance for 7 min then level 3 resistance for 3 min.   Other Treatments: Standardized assessment completed in order to see functional deficits in RUE vs LUE, scores noted: 9HPT: R=1:01, L=0:30    Pt seated in W/C at end of session with W/C  alarm donned, call light within reach and 4Ps assessed.    Therapy/Group: Individual Therapy  Camie Hoe, OTD, OTR/L 03/25/2023, 3:56 PM

## 2023-03-25 NOTE — Progress Notes (Signed)
 Inpatient Rehabilitation Care Coordinator Assessment and Plan Patient Details  Name: Harry Zamora MRN: [id number] Date of Birth: 1940-05-16  Today's Date: 03/25/2023  Hospital Problems: Principal Problem:   Contusion of cervical cord, initial encounter [facility])  Past Medical History:  Past Medical History:  Diagnosis Date   Anemia    as an infant   Arthritis    Back pain    Depression    Disc displacement, lumbar    Gynecomastia    History of kidney stones    Hyperlipidemia    Hypertension    no longer on medications   Hypertriglyceridemia    Insomnia    Low back pain    Lumbar radiculopathy    Lumbar stenosis    OA (osteoarthritis)    Polyneuropathy in diabetes(357.2)    Prostate cancer (HCC) 2003   Restless legs    Ringing in ears    RLS (restless legs syndrome)    Type 2 diabetes mellitus (HCC)    Past Surgical History:  Past Surgical History:  Procedure Laterality Date   ABDOMINAL EXPOSURE N/A 07/13/2019   Procedure: ABDOMINAL EXPOSURE;  Surgeon: Serene Gaile LELON, MD;  Location: [facility] OR;  Service: Vascular;  Laterality: N/A;  anterior approach   ANTERIOR LAT LUMBAR FUSION Right 07/13/2019   Procedure: Right Lumbar 3-4 Lumbar 4-5 Anterolateral lumbar interbody fusion;  Surgeon: Unice Pac, MD;  Location: [facility] OR;  Service: Neurosurgery;  Laterality: Right;   ANTERIOR LUMBAR FUSION N/A 07/13/2019   Procedure: Lumbar Five-Sacral One Anterior lumbar interbody fusion;  Surgeon: Unice Pac, MD;  Location: [facility] OR;  Service: Neurosurgery;  Laterality: N/A;  Anterior approach   APPLICATION OF INTRAOPERATIVE CT SCAN N/A 04/24/2021   Procedure: APPLICATION OF INTRAOPERATIVE CT SCAN;  Surgeon: Dawley, Lani BROCKS, DO;  Location: [facility] OR;  Service: Neurosurgery;  Laterality: N/A;  3C/RM 21   CHOLECYSTECTOMY     COLONOSCOPY     CORONARY STENT INTERVENTION N/A 10/01/2020   Procedure: CORONARY STENT INTERVENTION;  Surgeon: Elmira Newman PARAS, MD;  Location: [facility] INVASIVE CV LAB;  Service:  Cardiovascular;  Laterality: N/A;   EYE SURGERY Bilateral    GALLBLADDER SURGERY     HEMORRHOID SURGERY  1983   LEFT HEART CATH AND CORONARY ANGIOGRAPHY N/A 09/17/2020   Procedure: LEFT HEART CATH AND CORONARY ANGIOGRAPHY;  Surgeon: Elmira Newman PARAS, MD;  Location: [facility] INVASIVE CV LAB;  Service: Cardiovascular;  Laterality: N/A;   LUMBAR LAMINECTOMY/DECOMPRESSION MICRODISCECTOMY N/A 10/02/2016   Procedure: L3 to S1 Laminectomy;  Surgeon: Unice Pac, MD;  Location: [facility] OR;  Service: Neurosurgery;  Laterality: N/A;  L3 to S1 Laminectomy   LUMBAR LAMINECTOMY/DECOMPRESSION MICRODISCECTOMY Bilateral 09/07/2019   Procedure: Bilateral Lumbar Five - Sacral One Foraminotomy;  Surgeon: Unice Pac, MD;  Location: [facility] OR;  Service: Neurosurgery;  Laterality: Bilateral;  posterior   LUMBAR PERCUTANEOUS PEDICLE SCREW 3 LEVEL N/A 07/13/2019   Procedure: Percutaneous pedicle screw fixation from Lumbar 3 to Sacral 1;  Surgeon: Unice Pac, MD;  Location: [facility] OR;  Service: Neurosurgery;  Laterality: N/A;   MENISCUS REPAIR Right    PROSTATE SURGERY     SHOULDER SURGERY Left 01/2011   rotator cuff   Social History:  reports that he has been smoking cigars. He has never used smokeless tobacco. He reports that he does not currently use alcohol. He reports that he does not use drugs.  Family / Support Systems Marital Status: Married Patient Roles: Spouse, Parent Spouse/Significant Other: [name] [phone] Children: [name]-daughter [phone] Other Supports: neighbors  Anticipated Caregiver: Wife Ability/Limitations of Caregiver: Wife can do supervision only due to health issues Caregiver Availability: 24/7 Family Dynamics: Close knit with wife and daughter, they rely upon each other and feel they were doing well until this fall. he has neighbors who will check on them also  Social History Preferred language: English Religion: Baptist Cultural Background: No issues Education: HS Health Literacy - How  often do you need to have someone help you when you read instructions, pamphlets, or other written material from your doctor or pharmacy?: Never Writes: Yes Employment Status: Retired Marine Scientist Issues: No issues Guardian/Conservator: None-according to MD pt is capable of making his own decisions while here   Abuse/Neglect Abuse/Neglect Assessment Can Be Completed: Yes Physical Abuse: Denies Verbal Abuse: Denies Sexual Abuse: Denies Exploitation of patient/patient's resources: Denies Self-Neglect: Denies  Patient response to: Social Isolation - How often do you feel lonely or isolated from those around you?: Rarely  Emotional Status Pt's affect, behavior and adjustment status: Pt is motivted to to do well and regain his independence, he is havng BP issues and feels thisis the reason he fell in the first place. Pt is doing well and recovering from his fall Recent Psychosocial Issues: other health issues Psychiatric History: Hx-depression takes medications for this and finds them helpful. Substance Abuse History: smokes cigars aware of the health risks and feels he will be fine  Patient / Family Perceptions, Expectations & Goals Pt/Family understanding of illness & functional limitations: Pt is able to explain his fall and hitting his head, he has a goose egg on the back of his head from this. He does talk with the MD's involved and feels he has a good undertanding of his plan moving forward. Premorbid pt/family roles/activities: husband, father, retiree, neighbor, church member Anticipated changes in roles/activities/participation: resume Pt/family expectations/goals: Pt states:  I hope to contiune to do well and get this blood pressure under control before I leave.  Community [employer] Agencies: None Premorbid Home Care/DME Agencies: Other (Comment) (cane rollator) Transportation available at discharge: self and wife Is the patient able to respond to  transportation needs?: Yes In the past 12 months, has lack of transportation kept you from medical appointments or from getting medications?: No In the past 12 months, has lack of transportation kept you from meetings, work, or from getting things needed for daily living?: No Resource referrals recommended: Neuropsychology  Discharge Planning Living Arrangements: Spouse/significant other Support Systems: Spouse/significant other, Children, Friends/neighbors, Psychologist, Clinical community Type of Residence: Private residence Insurance Resources: [employer] (specify) ([employer] Medicare) Financial Resources: Social Security, Family Support Financial Screen Referred: No Living Expenses: Own Money Management: Patient, Spouse Does the patient have any problems obtaining your medications?: No Home Management: wife Patient/Family Preliminary Plans: Return home with wife who is able to provide 24/7 supervision. She has health issues of her own and can not proivde physical assist. Pt is doing quite well and will be a short LOS Care Coordinator Barriers to Discharge: Decreased caregiver support Care Coordinator Anticipated Follow Up Needs: HH/OP  Clinical Impression Pleasant gentleman who is motivated to recover and regain his independent before he leave here He is having BP issues and MD is addressing this. Will work on discharge needs  Raymonde Asberry MATSU 03/25/2023, 9:42 AM

## 2023-03-25 NOTE — Progress Notes (Signed)
 PROGRESS NOTE   Subjective/Complaints:   Pt reports no dizziness/lightheadedness yesterday with therapy, but per OT, PT had Sx's yesterday- couldn't get BP because sat down and was OK after that.    LBM this AM  Therapy reports using ACE wraps and thigh high TEDs.    ROS:    Pt denies SOB, abd pain, CP, N/V/C/D, and vision changes   Objective:   No results found. Recent Labs    03/23/23 0523 03/24/23 0517  WBC 6.5 5.6  HGB 8.2* 8.2*  HCT 24.5* 24.0*  PLT 368 389   Recent Labs    03/23/23 0523 03/24/23 0517  NA 136 135  K 3.6 3.8  CL 101 98  CO2 25 27  GLUCOSE 181* 207*  BUN 16 16  CREATININE 1.01 1.14  CALCIUM  8.4* 8.6*    Intake/Output Summary (Last 24 hours) at 03/25/2023 0921 Last data filed at 03/25/2023 0727 Gross per 24 hour  Intake 480 ml  Output --  Net 480 ml        Physical Exam: Vital Signs Blood pressure 118/64, pulse 69, temperature 98.5 F (36.9 C), resp. rate 16, height 6' 2 (1.88 m), weight 74.8 kg, SpO2 94%.   General: awake, alert, appropriate, Sitting EOB with OT wrapping legs;  NAD HENT: conjugate gaze; oropharynx moist CV: regular rate and rhythm; no JVD Pulmonary: CTA B/L; no W/R/R- good air movement GI: soft, NT, ND, (+)BS- normoactive Psychiatric: appropriate Neurological: Ox3  Musculoskeletal:     Cervical back: Neck supple. No tenderness.     Comments: RUE- deltoid 4+/5; Biceps 4+/5; Triceps 4/5; WE 4+/5; Grip 5-/5 and FA 4+/5 LUE 5/5 in same muscles RLE- 5-/5 throughout LLE- 5/5   Skin:    General: Skin is warm and dry.  Neurological:     Comments: Patient is awake alert follows commands.  Oriented to person place and time. No clonus; no hoffmans and no increased tone in Ue's and LE's.   Assessment/Plan: 1. Functional deficits which require 3+ hours per day of interdisciplinary therapy in a comprehensive inpatient rehab setting. Physiatrist is  providing close team supervision and 24 hour management of active medical problems listed below. Physiatrist and rehab team continue to assess barriers to discharge/monitor patient progress toward functional and medical goals  Care Tool:  Bathing    Body parts bathed by patient: Right arm, Left lower leg, Face, Left arm, Chest, Abdomen, Front perineal area, Buttocks, Right upper leg, Left upper leg, Right lower leg         Bathing assist Assist Level: Supervision/Verbal cueing     Upper Body Dressing/Undressing Upper body dressing   What is the patient wearing?: Pull over shirt    Upper body assist Assist Level: Supervision/Verbal cueing    Lower Body Dressing/Undressing Lower body dressing      What is the patient wearing?: Underwear/pull up, Pants     Lower body assist Assist for lower body dressing: Supervision/Verbal cueing     Toileting Toileting    Toileting assist Assist for toileting: Contact Guard/Touching assist     Transfers Chair/bed transfer  Transfers assist     Chair/bed transfer assist level: Supervision/Verbal cueing  Locomotion Ambulation   Ambulation assist      Assist level: Contact Guard/Touching assist Assistive device: Walker-rolling Max distance: 20 ft   Walk 10 feet activity   Assist     Assist level: Contact Guard/Touching assist Assistive device: Walker-rolling   Walk 50 feet activity   Assist Walk 50 feet with 2 turns activity did not occur: Safety/medical concerns (BP)         Walk 150 feet activity   Assist Walk 150 feet activity did not occur: Safety/medical concerns         Walk 10 feet on uneven surface  activity   Assist     Assist level: Contact Guard/Touching assist     Wheelchair     Assist Is the patient using a wheelchair?: Yes Type of Wheelchair: Manual    Wheelchair assist level: Supervision/Verbal cueing Max wheelchair distance: 200 ft    Wheelchair 50 feet with 2  turns activity    Assist        Assist Level: Supervision/Verbal cueing   Wheelchair 150 feet activity     Assist      Assist Level: Supervision/Verbal cueing   Blood pressure 118/64, pulse 69, temperature 98.5 F (36.9 C), resp. rate 16, height 6' 2 (1.88 m), weight 74.8 kg, SpO2 94%.  Medical Problem List and Plan: 1. 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  Soft cervical collar for comfort.             -patient may  shower             -ELOS/Goals: 8-11 days- supervision  Con't CIR PT and OT  Limited by orthostatic hypotension 2.  Antithrombotics: -DVT/anticoagulation:  Mechanical: Antiembolism stockings, thigh (TED hose) Bilateral lower extremities             -antiplatelet therapy: Aspirin  81 mg daily 3. Pain Management/chronic back pain: Neurontin  100 mg nightly, Cymbalta  30 mg daily  1/8- denies pain- con't regimen 4. Mood/Behavior/Sleep: Provide emotional support             -antipsychotic agents: N/A 5. Neuropsych/cognition: This patient is capable of making decisions on his own behalf. 6. Skin/Wound Care: Routine skin checks 7. Fluids/Electrolytes/Nutrition: Routine in and outs with follow-up chemistries 8.  History of orthostatic hypotension.  ProAmatine  5 mg 3 times daily.  Patient [facility] by [facility] neurology  1/8- will monitor BP closely since had fall at home, likely due to low BP  1/9- will order Abd binder since had dizziness yesterday- they are doing ACE wraps as well  9.  Restless leg syndrome.  Requip  4 mg nightly 10.  Hyperlipidemia.  Crestor  11.  Diabetes mellitus with peripheral neuropathy.  Hemoglobin A1c 7.5.  Currently on SSI.  PTA patient on Trulicity  3 mg every Wednesday, Semglee 18 units daily, Tresiba FlexTouch weekly, Glucophage  1000 mg twice daily.  Resume as needed  1/8- CBGs in high  100s to 200's- will restart Metformin  500mg  BID-.  1/9- CBGs looking a little better- will give a few days before increasing back to home dose of 1000 mg BID 12.  AKI.  Follow-up chemistries  1/8- has resolved 13.  CAD with stenting.  Plavix  currently on hold and aspirin  has been initiated. 14.  Chronic anemia.  Continue iron supplement.  Follow-up CBC 15.  History of tobacco use.  Provide counseling 16. Droplet precautions-  has been in hospital since 12/29- on droplet precautions the entire time- no coughing, cold S'xs- will send if can remove  1/8- off droplet precautions  I spent a total of 35   minutes on total care today- >50% coordination of care- due to  D/w team about orthostatic hypotension- PT and OT notes as well as d/w OT- about orthostasis- and monitoring of labs, vitals       LOS: 2 days A FACE TO FACE EVALUATION WAS PERFORMED  Elisabet Gutzmer 03/25/2023, 9:21 AM

## 2023-03-25 NOTE — Progress Notes (Signed)
 Inpatient Rehabilitation Center Individual Statement of Services  Patient Name:  Harry Zamora  Date:  03/25/2023  Welcome to the Inpatient Rehabilitation Center.  Our goal is to provide you with an individualized program based on your diagnosis and situation, designed to meet your specific needs.  With this comprehensive rehabilitation program, you will be expected to participate in at least 3 hours of rehabilitation therapies Monday-Friday, with modified therapy programming on the weekends.  Your rehabilitation program will include the following services:  Physical Therapy (PT), Occupational Therapy (OT), 24 hour per day rehabilitation nursing, Neuropsychology, Care Coordinator, Rehabilitation Medicine, Nutrition Services, and Pharmacy Services  Weekly team conferences will be held on Tuesday to discuss your progress.  Your Inpatient Rehabilitation Care Coordinator will talk with you frequently to get your input and to update you on team discussions.  Team conferences with you and your family in attendance may also be held.  Expected length of stay: 7-9 days  Overall anticipated outcome: independent with a device  Depending on your progress and recovery, your program may change. Your Inpatient Rehabilitation Care Coordinator will coordinate services and will keep you informed of any changes. Your Inpatient Rehabilitation Care Coordinator's name and contact numbers are listed  below.  The following services may also be recommended but are not provided by the Inpatient Rehabilitation Center:  Driving Evaluations Home Health Rehabiltiation Services Outpatient Rehabilitation Services    Arrangements will be made to provide these services after discharge if needed.  Arrangements include referral to agencies that provide these services.  Your insurance has been verified to be:  [employer] Your primary doctor is:  Garnette Ore  Pertinent information will be shared with your doctor and your  insurance company.  Inpatient Rehabilitation Care Coordinator:  Rhoda Clement, KEN [phone] or ELIGAH BASQUES  Information discussed with and copy given to patient by: Clement Asberry MATSU, 03/25/2023, 9:43 AM

## 2023-03-26 ENCOUNTER — Other Ambulatory Visit (HOSPITAL_COMMUNITY): Payer: Self-pay

## 2023-03-26 DIAGNOSIS — S14109A Unspecified injury at unspecified level of cervical spinal cord, initial encounter: Secondary | ICD-10-CM | POA: Diagnosis not present

## 2023-03-26 LAB — GLUCOSE, CAPILLARY
Glucose-Capillary: 157 mg/dL — ABNORMAL HIGH (ref 70–99)
Glucose-Capillary: 174 mg/dL — ABNORMAL HIGH (ref 70–99)
Glucose-Capillary: 174 mg/dL — ABNORMAL HIGH (ref 70–99)
Glucose-Capillary: 157 mg/dL — ABNORMAL HIGH (ref 70–99)

## 2023-03-26 NOTE — Progress Notes (Signed)
 Orthopedic Tech Progress Note Patient Details:  Harry Zamora Harry Zamora 03/10/1941 [id number]  Ortho Devices Type of Ortho Device: Abdominal binder Ortho Device/Splint Location: Left at bedside Ortho Device/Splint Interventions: Rosa Adine MARLA Tanda 03/26/2023, 1:35 PM

## 2023-03-26 NOTE — IPOC Note (Signed)
 Overall Plan of Care [facility]) Patient Details Name: Harry Zamora MRN: [id number] DOB: Nov 06, 1940  Admitting Diagnosis: Contusion of cervical cord, initial encounter [facility])  Hospital Problems: Principal Problem:   Contusion of cervical cord, initial encounter [facility])     Functional Problem List: Nursing Endurance, Medication Management, Safety, Skin Integrity, Nutrition, Pain, Bowel, Bladder, Behavior  PT Safety, Motor  OT Balance, Endurance, Motor, Safety, Sensory  SLP    TR         Basic ADL's: OT Grooming, Bathing, Toileting, Dressing     Advanced  ADL's: OT       Transfers: PT Bed Mobility, Bed to Chair, Car  OT Toilet     Locomotion: PT Ambulation, Stairs     Additional Impairments: OT None  SLP        TR      Anticipated Outcomes Item Anticipated Outcome  Self Feeding mod I  Swallowing      Basic self-care  Mod I  Toileting  Mod I   Bathroom Transfers Mod I  Bowel/Bladder  manage bowel and bladder with min assistance  Transfers  Mod I  Locomotion  Mod I  Communication     Cognition     Pain  pain less than 4 with prn pain meds  Safety/Judgment  moderate asistance   Therapy Plan: PT Intensity: Minimum of 1-2 x/day ,45 to 90 minutes PT Frequency: 5 out of 7 days PT Duration Estimated Length of Stay: 5-7 days OT Intensity: Minimum of 1-2 x/day, 45 to 90 minutes OT Frequency: 5 out of 7 days OT Duration/Estimated Length of Stay: 7-10 days     Team Interventions: Nursing Interventions Patient/Family Education, Disease Management/Prevention, Skin Care/Wound Management, Medication Management, Cognitive Remediation/Compensation, Bladder Management, Bowel Management  PT interventions Ambulation/gait training, Discharge planning, DME/adaptive equipment instruction, Pain management, Functional mobility training, Psychosocial support, Therapeutic Activities, UE/LE Strength taining/ROM, Wheelchair propulsion/positioning, UE/LE Coordination activities,  Therapeutic Exercise, Stair training, Skin care/wound management, Patient/family education, Neuromuscular re-education, Disease management/prevention, Firefighter, Warden/ranger  OT Interventions Warden/ranger, Disease mangement/prevention, Neuromuscular re-education, Self Care/advanced ADL retraining, Therapeutic Exercise, Wheelchair propulsion/positioning, Cognitive remediation/compensation, DME/adaptive equipment instruction, Pain management, Skin care/wound managment, UE/LE Strength taining/ROM, Community reintegration, Development worker, international aid stimulation, Patient/family education, UE/LE Coordination activities, Discharge planning, Functional mobility training, Therapeutic Activities, Psychosocial support, Visual/perceptual remediation/compensation  SLP Interventions    TR Interventions    SW/CM Interventions Discharge Planning, Psychosocial Support, Patient/Family Education   Barriers to Discharge MD  Medical stability, Home enviroment access/loayout, Neurogenic bowel and bladder, and Lack of/limited family support  Nursing New oxygen, Decreased caregiver support, Incontinence manage bowel/ bladder management; 02 management ; safety at home  PT Incontinence, Behavior safety awareness  OT Incontinence    SLP      SW Decreased caregiver support     Team Discharge Planning: Destination: PT-Home ,OT- Home , SLP-  Projected Follow-up: PT-Outpatient PT, OT-  Home health OT, SLP-  Projected Equipment Needs: PT-To be determined, OT-  , SLP-  Equipment Details: PT-TBD, OT-owns shower chair with back, cane and scooter, and rollator Patient/family involved in discharge planning: PT- Patient,  OT-Patient, SLP-   MD ELOS: 7-10 days Medical Rehab Prognosis:  Good Assessment: The patient has been admitted for CIR therapies with the diagnosis of spinal cord contusion. The team will be addressing functional mobility, strength, stamina, balance, safety, adaptive  techniques and equipment, self-care, bowel and bladder mgt, patient and caregiver education, . Goals have been set at mod I. Anticipated discharge destination is  home with wife.        See Team Conference Notes for weekly updates to the plan of care

## 2023-03-26 NOTE — Progress Notes (Signed)
 Physical Therapy Session Note  Patient Details  Name: Harry Zamora MRN: [id number] Date of Birth: 1940-11-13  Today's Date: 03/26/2023 PT Individual Time: 9044-8954 and 1147-1207 PT Individual Time Calculation (min): 50 min and 20 min  Short Term Goals: Week 1:  PT Short Term Goal 1 (Week 1): STG's=LTG's due to ELOS  Skilled Therapeutic Interventions/Progress Updates: Pt presented in bed agreeable to therapy. Pt denies pain at start of session. Pt completed bed mobility with supervision and use of bed features. Completed ambulatory transfer to w/c with CGA. Pt transported to day room for energy conservation. Completed ambulatory transfer to high/low mat. Participated in combination of posterior chain stretching in combination with reaching tasks. Pt stood on small wedge and participated in grasping rings and placing on small cone using BUE interchangeably x 4. Pt also performed task of picking up and placing clothes pins on basketball net using green/red/yellow clothespins while standing on red wedge. Pt was able to complete both tasks without seated rest break. Pt then ambulated ~12ft to NuStep with RW and CGA. Participated in NuStep L3 2 x 5 min for improved cardiovascular endurance. Once completed performed ambulatory transfer to w/c in same manner as prior and transported back to room. Pt requesting to return to bed. Completed ambulatory transfer to bed and transferred to supine with supervision to flat bed. Pt repositioned to comfort and left with bed alarm on, call bell within reach and needs met.   Tx2: Pt presented in bed agreeable to therapy. Pt agreeable to work on ambulation. Pt completed supine to sit with supervision with HOB slightly elevated. BP assessed prior to standing 100/39 (62) HR 79 asymptomatic. Pt participated in various AROM therex for increased circulation including ankle pumps, LAQ, elbow flexion/extension, chest press', and shoulder flexion x 10 bilaterally. BP assessed  again  91/47 (60) HR 78 mildly symptomatic. Performed additional activity Sit to stand  x5  BP assessed again 87/46 (60) HR 87. Ambulation deferred due to [facility] however discussed with pt spending time in recliner for improved BP regulation with pt agreeable. Pt completed stand step transfer to w/c to allow PTA to set up recliner closer to bed. Pt completed stand step transfer to recliner all performed with CGA. Pt set up in recliner with final BP 123/60(78) HR 73. Pt left in recliner at end of session with seat alarm on, call bell within reach and needs met.      Therapy Documentation Precautions:  Precautions Precautions: Fall Other Brace: soft cervical collar for comfort Restrictions Weight Bearing Restrictions Per Provider Order: No General:   Vital Signs: Therapy Vitals Temp: 97.8 F (36.6 C) Pulse Rate: 66 Resp: 18 BP: 132/67 Patient Position (if appropriate): Lying Oxygen Therapy SpO2: 97 % O2 Device: Room Air Pain:   Mobility:   Locomotion :    Trunk/Postural Assessment :    Balance:   Exercises:   Other Treatments:      Therapy/Group: Individual Therapy  Cheyane Ayon 03/26/2023, 4:19 PM

## 2023-03-26 NOTE — Progress Notes (Signed)
 Physical Therapy Session Note  Patient Details  Name: Harry Zamora MRN: [id number] Date of Birth: 1940/05/01  Today's Date: 03/26/2023 PT Individual Time: 1300-1415 PT Individual Time Calculation (min): 75 min   Short Term Goals: Week 1:  PT Short Term Goal 1 (Week 1): STG's=LTG's due to ELOS  Skilled Therapeutic Interventions/Progress Updates: Pt presents sitting in recliner and agreeable to therapy.  Pt transfers sit to stand w/ min A, w/ retropulsion, rquiring cues for forward scoot and lean for improved independence.  Pt requested use of BR, continent of bladder, charted in Flowsheets.  Pt amb to main gym w/ RW and CGA/close supervision.  Pt performed step-ups to >4 platform w/ CGA, alternating feet, then progressed to 6 step.  Pt performed sidestepping up and down to 6 platform w/ min A, cues for BOS.  Pt steeed over platform and then amb w/o AD x 90' including u-turn to return to mat table w/ CGA.  Pt amb 150' multiple trials w/o AD and CGA.  Pt amb up/down ramp and across mulch pit w/ CGA/min A, w/o LOB.  Pt returned to room w/o AD and sat EOB, but then decided to transfer to recliner w/ CGA.  Pt requires frequent cues for forward lean.  Pt remained sitting w/ LES elevated and all needs in reach.      Therapy Documentation Precautions:  Precautions Precautions: Fall Other Brace: soft cervical collar for comfort Restrictions Weight Bearing Restrictions Per Provider Order: No General:   Vital Signs:   Pain: 5/10      Therapy/Group: Individual Therapy  Aseret Hoffman P Else Habermann 03/26/2023, 2:16 PM

## 2023-03-26 NOTE — Progress Notes (Signed)
 PROGRESS NOTE   Subjective/Complaints:   Pt reports he wants to go home- asking when that's possible- explained will be leaving in upcoming week, not not today.   No issues LBM yesterday  ROS:    Pt denies SOB, abd pain, CP, N/V/C/D, and vision changes   Objective:   No results found. Recent Labs    03/24/23 0517  WBC 5.6  HGB 8.2*  HCT 24.0*  PLT 389   Recent Labs    03/24/23 0517  NA 135  K 3.8  CL 98  CO2 27  GLUCOSE 207*  BUN 16  CREATININE 1.14  CALCIUM  8.6*    Intake/Output Summary (Last 24 hours) at 03/26/2023 1250 Last data filed at 03/25/2023 1745 Gross per 24 hour  Intake 716 ml  Output --  Net 716 ml        Physical Exam: Vital Signs Blood pressure 124/63, pulse 76, temperature 97.6 F (36.4 C), resp. rate 16, height 6' 2 (1.88 m), weight 74.8 kg, SpO2 95%.     General: awake, alert, appropriate, NAD HENT: conjugate gaze; oropharynx moist CV: regular rate; no JVD Pulmonary: CTA B/L; no W/R/R- good air movement GI: soft, NT, ND, (+)BS Psychiatric: appropriate Neurological: Ox3   Musculoskeletal:     Cervical back: Neck supple. No tenderness.     Comments: RUE- deltoid 4+/5; Biceps 4+/5; Triceps 4/5; WE 4+/5; Grip 5-/5 and FA 4+/5 LUE 5/5 in same muscles RLE- 5-/5 throughout LLE- 5/5   Skin:    General: Skin is warm and dry.  Neurological:     Comments: Patient is awake alert follows commands.  Oriented to person place and time. No clonus; no hoffmans and no increased tone in Ue's and LE's.   Assessment/Plan: 1. Functional deficits which require 3+ hours per day of interdisciplinary therapy in a comprehensive inpatient rehab setting. Physiatrist is providing close team supervision and 24 hour management of active medical problems listed below. Physiatrist and rehab team continue to assess barriers to discharge/monitor patient progress toward functional and medical  goals  Care Tool:  Bathing    Body parts bathed by patient: Right arm, Left lower leg, Face, Left arm, Chest, Abdomen, Front perineal area, Buttocks, Right upper leg, Left upper leg, Right lower leg         Bathing assist Assist Level: Supervision/Verbal cueing     Upper Body Dressing/Undressing Upper body dressing   What is the patient wearing?: Pull over shirt    Upper body assist Assist Level: Supervision/Verbal cueing    Lower Body Dressing/Undressing Lower body dressing      What is the patient wearing?: Underwear/pull up, Pants     Lower body assist Assist for lower body dressing: Supervision/Verbal cueing     Toileting Toileting    Toileting assist Assist for toileting: Contact Guard/Touching assist     Transfers Chair/bed transfer  Transfers assist     Chair/bed transfer assist level: Supervision/Verbal cueing     Locomotion Ambulation   Ambulation assist      Assist level: Contact Guard/Touching assist Assistive device: Walker-rolling Max distance: 20 ft   Walk 10 feet activity   Assist  Assist level: Contact Guard/Touching assist Assistive device: Walker-rolling   Walk 50 feet activity   Assist Walk 50 feet with 2 turns activity did not occur: Safety/medical concerns (BP)         Walk 150 feet activity   Assist Walk 150 feet activity did not occur: Safety/medical concerns         Walk 10 feet on uneven surface  activity   Assist     Assist level: Contact Guard/Touching assist     Wheelchair     Assist Is the patient using a wheelchair?: Yes Type of Wheelchair: Manual    Wheelchair assist level: Supervision/Verbal cueing Max wheelchair distance: 200 ft    Wheelchair 50 feet with 2 turns activity    Assist        Assist Level: Supervision/Verbal cueing   Wheelchair 150 feet activity     Assist      Assist Level: Supervision/Verbal cueing   Blood pressure 124/63, pulse 76,  temperature 97.6 F (36.4 C), resp. rate 16, height 6' 2 (1.88 m), weight 74.8 kg, SpO2 95%.  Medical Problem List and Plan: 1. 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  Soft cervical collar for comfort.             -patient may  shower             -ELOS/Goals: 8-11 days- supervision  Con't CIR PT and OT IPOC done 2.  Antithrombotics: -DVT/anticoagulation:  Mechanical: Antiembolism stockings, thigh (TED hose) Bilateral lower extremities             -antiplatelet therapy: Aspirin  81 mg daily 3. Pain Management/chronic back pain: Neurontin  100 mg nightly, Cymbalta  30 mg daily  1/8- denies pain- con't regimen 4. Mood/Behavior/Sleep: Provide emotional support             -antipsychotic agents: N/A 5. Neuropsych/cognition: This patient is capable of making decisions on his own behalf. 6. Skin/Wound Care: Routine skin checks 7. Fluids/Electrolytes/Nutrition: Routine in and outs with follow-up chemistries 8.  History of orthostatic hypotension.  ProAmatine  5 mg 3 times daily.  Patient followed by [facility] neurology  1/8- will monitor BP closely since had fall at home, likely due to low BP  1/9- will order Abd binder since had dizziness yesterday- they are doing ACE wraps as well  9.  Restless leg syndrome.  Requip  4 mg nightly 10.  Hyperlipidemia.  Crestor  11.  Diabetes mellitus with peripheral neuropathy.  Hemoglobin A1c 7.5.  Currently on SSI.  PTA patient on Trulicity  3 mg every Wednesday, Semglee 18 units daily, Tresiba FlexTouch weekly, Glucophage  1000 mg twice daily.  Resume as needed  1/8- CBGs in high 100s to 200's- will restart Metformin  500mg  BID-.  1/9- CBGs looking a little better- will give a few days before increasing back to home dose of 1000 mg BID  1/10- CBGs 102-205- if IT IS APPROPRIATE, SUGGEST INCREASING METFORMIN  TO 1000 MG  bid TOMORROw 12.  AKI.  Follow-up chemistries  1/8- has resolved 13.  CAD with stenting.  Plavix  currently on hold and aspirin  has been initiated. 14.  Chronic anemia.  Continue iron supplement.  Follow-up CBC 15.  History of tobacco use.  Provide counseling 16. Droplet precautions- has been in hospital since 12/29- on droplet precautions the entire time- no coughing, cold S'xs- will send if can remove  1/8-  off droplet precautions        LOS: 3 days A FACE TO FACE EVALUATION WAS PERFORMED  Bunyan Brier 03/26/2023, 12:50 PM

## 2023-03-26 NOTE — Progress Notes (Signed)
 Occupational Therapy Session Note  Patient Details  Name: Harry Zamora MRN: [id number] Date of Birth: July 09, 1940  Today's Date: 03/26/2023 OT Individual Time: 9184-9074 OT Individual Time Calculation (min): 70 min    Short Term Goals: Week 1:  OT Short Term Goal 1 (Week 1): STG=LTG d/t ELOS  Skilled Therapeutic Interventions/Progress Updates:    Pt resting in bed upon arrival. Pt declined bathing/dressing today. Ace wraps applied and Ted hose donned on BLE prior to pt sitting EOB. BP supine-126/71; BP seated-95/55 with Ace wraps and Ted hose. Pt asymptomatic. Unable to acquire standing BP 2/2 pt fatigued while standing. Supine>sit and transfer to w/c with CGA. FMC tasks at table: nuts and bolts using RUE. Pt issued yellow theraputty and beads. Pt removed and replaced small beads from theraputty with focus on increased RUE use to improve functional use of RUE in BADLs. Pt retunred to room and remained in w/c. Belt alarm activated. All needs within reach.   Therapy Documentation Precautions:  Precautions Precautions: Fall Other Brace: soft cervical collar for comfort Restrictions Weight Bearing Restrictions Per Provider Order: No Pain: Pt denies pain this morning   Therapy/Group: Individual Therapy  Maritza Debby Mare 03/26/2023, 9:33 AM

## 2023-03-27 DIAGNOSIS — E1169 Type 2 diabetes mellitus with other specified complication: Secondary | ICD-10-CM | POA: Diagnosis not present

## 2023-03-27 DIAGNOSIS — Z794 Long term (current) use of insulin: Secondary | ICD-10-CM

## 2023-03-27 DIAGNOSIS — D649 Anemia, unspecified: Secondary | ICD-10-CM | POA: Diagnosis not present

## 2023-03-27 DIAGNOSIS — I951 Orthostatic hypotension: Secondary | ICD-10-CM | POA: Diagnosis not present

## 2023-03-27 DIAGNOSIS — S14109A Unspecified injury at unspecified level of cervical spinal cord, initial encounter: Secondary | ICD-10-CM | POA: Diagnosis not present

## 2023-03-27 LAB — GLUCOSE, CAPILLARY
Glucose-Capillary: 156 mg/dL — ABNORMAL HIGH (ref 70–99)
Glucose-Capillary: 195 mg/dL — ABNORMAL HIGH (ref 70–99)
Glucose-Capillary: 164 mg/dL — ABNORMAL HIGH (ref 70–99)
Glucose-Capillary: 161 mg/dL — ABNORMAL HIGH (ref 70–99)

## 2023-03-27 MED ORDER — METFORMIN HCL 500 MG PO TABS
1000.0000 mg | ORAL_TABLET | Freq: Two times a day (BID) | ORAL | Status: DC
  Administered 2023-03-27 – 2023-04-01 (×10): 1000 mg via ORAL
  Filled 2023-03-27 (×10): qty 2

## 2023-03-27 NOTE — Progress Notes (Signed)
 Occupational Therapy Session Note  Patient Details  Name: Harry Zamora MRN: [id number] Date of Birth: 1940/12/21  Today's Date: 03/28/2023 OT Individual Time: 8649-8571 OT Individual Time Calculation (min): 38 min    Short Term Goals: Week 1:  OT Short Term Goal 1 (Week 1): STG=LTG d/t ELOS  Skilled Therapeutic Interventions/Progress Updates:  Pt greeted resting in bed for skilled OT session with focus on BUE conditioning at bed-level due to patient reports of nausea, although no reports of pain.   Pt requires encouragment to participate in session due to nausea, receptive to bed-level strengthening. Pt instructed in exercises noted below, completing 2x10 reps with 2# DB: Chest-press Forearm supination/pronation Wrist curls Internal/External rotation  Pt unable to progress with strengthening exercises above shoulder-level due to pain, cuing provided throughout for maintaining R-wrist stability due to reports of hand/arm feeling numb. Pt with improvement in nauseous feelings post-activity.   Pt remained resting in bed with 4Ps assessed and immediate needs met. Pt continues to be appropriate for skilled OT intervention to promote further functional independence in ADLs/IADLs.   Therapy Documentation Precautions:  Precautions Precautions: Fall Other Brace: soft cervical collar for comfort Restrictions Weight Bearing Restrictions Per Provider Order: No   Therapy/Group: Individual Therapy  Nereida Habermann, OTR/L, MSOT  03/28/2023, 7:52 AM

## 2023-03-27 NOTE — Progress Notes (Signed)
 PROGRESS NOTE   Subjective/Complaints:   No new complaint concerns or complaints this morning.  He is anxious to go home soon.  Denies any constipation.  Denies any bladder concerns as he just called nursing to go to the bathroom.  ROS:    Pt denies fevers, SOB, abd pain, CP, N/V/C/D, and vision changes   Objective:   No results found. No results for input(s): WBC, HGB, HCT, PLT in the last 72 hours.  No results for input(s): NA, K, CL, CO2, GLUCOSE, BUN, CREATININE, CALCIUM  in the last 72 hours.   Intake/Output Summary (Last 24 hours) at 03/27/2023 1052 Last data filed at 03/27/2023 0743 Gross per 24 hour  Intake 550 ml  Output --  Net 550 ml        Physical Exam: Vital Signs Blood pressure 108/67, pulse 74, temperature (!) 97.5 F (36.4 C), resp. rate 18, height 6' 2 (1.88 m), weight 74.8 kg, SpO2 94%.     General: awake, alert, appropriate, NAD HENT: conjugate gaze; oropharynx moist CV: regular rate; no JVD Pulmonary: CTA B/L; no W/R/R- good air movement GI: soft, NT, ND, (+)BS Psychiatric: appropriate Neurological: Ox3   Musculoskeletal:  Moving all 4 extremities in bed to gravity   Skin:    General: Skin is warm and dry.  Neurological:     Comments: Patient is awake alert follows commands.  Oriented to person place and time. No ankle clonus noted, no hypertonia noted   Prior Exam    Cervical back: Neck supple. No tenderness.     Comments: RUE- deltoid 4+/5; Biceps 4+/5; Triceps 4/5; WE 4+/5; Grip 5-/5 and FA 4+/5 LUE 5/5 in same muscles RLE- 5-/5 throughout LLE- 5/5 Assessment/Plan: 1. Functional deficits which require 3+ hours per day of interdisciplinary therapy in a comprehensive inpatient rehab setting. Physiatrist is providing close team supervision and 24 hour management of active medical problems listed below. Physiatrist and rehab team continue to assess  barriers to discharge/monitor patient progress toward functional and medical goals  Care Tool:  Bathing    Body parts bathed by patient: Right arm, Left lower leg, Face, Left arm, Chest, Abdomen, Front perineal area, Buttocks, Right upper leg, Left upper leg, Right lower leg         Bathing assist Assist Level: Supervision/Verbal cueing     Upper Body Dressing/Undressing Upper body dressing   What is the patient wearing?: Pull over shirt    Upper body assist Assist Level: Supervision/Verbal cueing    Lower Body Dressing/Undressing Lower body dressing      What is the patient wearing?: Underwear/pull up, Pants     Lower body assist Assist for lower body dressing: Supervision/Verbal cueing     Toileting Toileting    Toileting assist Assist for toileting: Supervision/Verbal cueing     Transfers Chair/bed transfer  Transfers assist     Chair/bed transfer assist level: Supervision/Verbal cueing     Locomotion Ambulation   Ambulation assist      Assist level: Contact Guard/Touching assist Assistive device: No Device Max distance: 150   Walk 10 feet activity   Assist     Assist level: Contact Guard/Touching assist Assistive device: No  Device   Walk 50 feet activity   Assist Walk 50 feet with 2 turns activity did not occur: Safety/medical concerns (BP)  Assist level: Contact Guard/Touching assist Assistive device: No Device    Walk 150 feet activity   Assist Walk 150 feet activity did not occur: Safety/medical concerns  Assist level: Contact Guard/Touching assist Assistive device: No Device    Walk 10 feet on uneven surface  activity   Assist     Assist level: Contact Guard/Touching assist Assistive device: Other (comment) (no device.)   Wheelchair     Assist Is the patient using a wheelchair?: Yes Type of Wheelchair: Manual    Wheelchair assist level: Supervision/Verbal cueing Max wheelchair distance: 200 ft     Wheelchair 50 feet with 2 turns activity    Assist        Assist Level: Supervision/Verbal cueing   Wheelchair 150 feet activity     Assist      Assist Level: Supervision/Verbal cueing   Blood pressure 108/67, pulse 74, temperature (!) 97.5 F (36.4 C), resp. rate 18, height 6' 2 (1.88 m), weight 74.8 kg, SpO2 94%.  Medical Problem List and Plan: 1. 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  Soft cervical collar for comfort.             -patient may  shower             -ELOS/Goals: 8-11 days- supervision  Con't CIR PT and OT IPOC done -Discussed team conference will be Tuesday of this week 2.  Antithrombotics: -DVT/anticoagulation:  Mechanical: Antiembolism stockings, thigh (TED hose) Bilateral lower extremities             -antiplatelet therapy: Aspirin  81 mg daily 3. Pain Management/chronic back pain: Neurontin  100 mg nightly, Cymbalta  30 mg daily  1/8- denies pain- con't regimen  1/11 denies any pain, continue to monitor 4. Mood/Behavior/Sleep: Provide emotional support             -antipsychotic agents: N/A 5. Neuropsych/cognition: This patient is capable of making decisions on his own behalf. 6. Skin/Wound Care: Routine skin checks 7. Fluids/Electrolytes/Nutrition: Routine in and outs with follow-up chemistries 8.  History of orthostatic hypotension.  ProAmatine  5 mg 3 times daily.  Patient followed by [facility] neurology  1/8- will monitor BP closely since had fall at home, likely due to low BP  1/9- will order Abd binder since had dizziness yesterday- they are doing ACE wraps as well   1/11 patient received abdominal binder, continue to monitor for dizziness     03/27/2023    6:01 AM 03/26/2023    7:22 PM 03/26/2023    4:04 PM  Vitals with BMI  Systolic 108 132 867  Diastolic 67 62 67  Pulse 74 77 66     9.  Restless leg syndrome.  Requip  4 mg nightly 10.  Hyperlipidemia.  Crestor  11.  Diabetes mellitus with peripheral neuropathy.  Hemoglobin A1c 7.5.  Currently on SSI.  PTA patient on Trulicity  3 mg every Wednesday, Semglee 18 units daily, Tresiba FlexTouch weekly, Glucophage  1000 mg twice daily.  Resume as needed  1/8- CBGs in high 100s to 200's- will restart Metformin  500mg  BID-.  1/9- CBGs looking a little better- will give a few days before increasing back to home dose of 1000 mg BID  1/10- CBGs 102-205- if IT IS  APPROPRIATE, SUGGEST INCREASING METFORMIN  TO 1000 MG bid TOMORROw  1/11 CBGs doing better however think it is reasonable to increase to have home dose of 1000 mg twice daily  CBG (last 3)  Recent Labs    03/26/23 1655 03/26/23 2022 03/27/23 0602  GLUCAP 174* 157* 156*     12.  AKI.  Follow-up chemistries  1/8- has resolved 13.  CAD with stenting.  Plavix  currently on hold and aspirin  has been initiated. 14.  Chronic anemia.  Continue iron supplement.  Follow-up CBC  -HGB stable at 8.2 on 1/8 15.  History of tobacco use.  Provide counseling 16. Droplet precautions- has been in hospital since 12/29- on droplet precautions the entire time- no coughing, cold S'xs- will send if can remove  1/8- off droplet precautions        LOS: 4 days A FACE TO FACE EVALUATION WAS PERFORMED  Harry Zamora 03/27/2023, 10:52 AM

## 2023-03-28 DIAGNOSIS — Z794 Long term (current) use of insulin: Secondary | ICD-10-CM | POA: Diagnosis not present

## 2023-03-28 DIAGNOSIS — K5900 Constipation, unspecified: Secondary | ICD-10-CM

## 2023-03-28 DIAGNOSIS — I2510 Atherosclerotic heart disease of native coronary artery without angina pectoris: Secondary | ICD-10-CM

## 2023-03-28 DIAGNOSIS — E1169 Type 2 diabetes mellitus with other specified complication: Secondary | ICD-10-CM | POA: Diagnosis not present

## 2023-03-28 DIAGNOSIS — I951 Orthostatic hypotension: Secondary | ICD-10-CM | POA: Diagnosis not present

## 2023-03-28 DIAGNOSIS — S14109A Unspecified injury at unspecified level of cervical spinal cord, initial encounter: Secondary | ICD-10-CM | POA: Diagnosis not present

## 2023-03-28 LAB — GLUCOSE, CAPILLARY
Glucose-Capillary: 150 mg/dL — ABNORMAL HIGH (ref 70–99)
Glucose-Capillary: 141 mg/dL — ABNORMAL HIGH (ref 70–99)
Glucose-Capillary: 113 mg/dL — ABNORMAL HIGH (ref 70–99)
Glucose-Capillary: 104 mg/dL — ABNORMAL HIGH (ref 70–99)

## 2023-03-28 MED ORDER — POLYETHYLENE GLYCOL 3350 17 G PO PACK
34.0000 g | PACK | Freq: Once | ORAL | Status: AC
  Administered 2023-03-28: 34 g via ORAL
  Filled 2023-03-28: qty 2

## 2023-03-28 MED ORDER — SENNOSIDES-DOCUSATE SODIUM 8.6-50 MG PO TABS
1.0000 | ORAL_TABLET | Freq: Every day | ORAL | Status: DC
  Administered 2023-03-28 – 2023-03-29 (×2): 1 via ORAL
  Filled 2023-03-28 (×2): qty 1

## 2023-03-28 MED ORDER — POLYETHYLENE GLYCOL 3350 17 G PO PACK: 17.0000 g | PACK | Freq: Every day | ORAL | Status: DC | PRN

## 2023-03-28 MED ORDER — SORBITOL 70 % SOLN: 30.0000 mL | Freq: Every day | Status: DC | PRN

## 2023-03-28 NOTE — Progress Notes (Signed)
 Physical Therapy Session Note  Patient Details  Name: RUSLAN MCCABE MRN: [id number] Date of Birth: 09/02/1940  Today's Date: 03/28/2023 PT Individual Time: 0800-0857 PT Individual Time Calculation (min): 57 min   Short Term Goals: Week 1:  PT Short Term Goal 1 (Week 1): STG's=LTG's due to ELOS  Skilled Therapeutic Interventions/Progress Updates:      Therapy Documentation Precautions:  Precautions Precautions: Fall Other Brace: soft cervical collar for comfort Restrictions Weight Bearing Restrictions Per Provider Order: No  Pt agreeable to PT session with emphasis on global strength, activity tolerance and balance training to increase independence with mobility prior to discharge. Pt declines pain, reports continued right hand numbness since fall.  PT donned thigh high ted hose, ace wraps, and abdominal binder dependently for BP management. Prior to ambulation pt performed LE exercises to increase blood flow and circulation x 30 of ankle pumps, LAQ's, and marches.   Pt supervision with bed mobility and transfers in session and CGA/supervision with gait with and without AD:   ~200 ft with RW; supervision; min cues for upward gaze   ~200 ft without AD; CGA; min cues for right arm swing  Transitioned to balance and strength training as pt performed following:  - sit<>stand while performing chest press of 1 kg ball 3 x 5 to facilitate increased anterior weight shift (supervision)  -standing marches 3 x 10 holding 1 kg ball (CGA)  -navigation of 6 inch hurdles (CGA)   Pt returned to room and left semi-reclined in bed with all needs in reach and alarm on.     Therapy/Group: Individual Therapy  Bevely Fonder Bevely Fonder PT, DPT  03/28/2023, 8:20 AM

## 2023-03-28 NOTE — Plan of Care (Signed)
  Problem: Consults Goal: RH SPINAL CORD INJURY PATIENT EDUCATION Description:  See Patient Education module for education specifics.  Outcome: Progressing   Problem: SCI BOWEL ELIMINATION Goal: RH STG MANAGE BOWEL WITH ASSISTANCE Description: STG Manage Bowel with mod I Assistance. Outcome: Progressing Goal: RH STG SCI MANAGE BOWEL WITH MEDICATION WITH ASSISTANCE Description: STG SCI Manage bowel with medication with mod I assistance. Outcome: Progressing   Problem: RH SKIN INTEGRITY Goal: RH STG SKIN FREE OF INFECTION/BREAKDOWN Description: Manage skin independently Outcome: Progressing   Problem: RH KNOWLEDGE DEFICIT SCI Goal: RH STG INCREASE KNOWLEDGE OF SELF CARE AFTER SCI Outcome: Progressing

## 2023-03-28 NOTE — Progress Notes (Signed)
 Occupational Therapy Session Note  Patient Details  Name: Harry Zamora MRN: [id number] Date of Birth: January 02, 1941  Today's Date: 03/28/2023 OT Individual Time: 1015-1100 OT Individual Time Calculation (min): 45 min    Short Term Goals: Week 1:  OT Short Term Goal 1 (Week 1): STG=LTG d/t ELOS  Skilled Therapeutic Interventions/Progress Updates:      Therapy Documentation Precautions:  Precautions Precautions: Fall Other Brace: soft cervical collar for comfort Restrictions Weight Bearing Restrictions Per Provider Order: No General: "I feel much better" Pt supine in bed upon OT arrival, agreeable to OT session.  Pain: no pain reported  ADL: Pt completed full ADL this date with SBA overall. Pt able to gather clothing within bad on bed, transfer to W/C and complete shaving and oral hygiene at sink, able to manipulate containers. Pt able to doff TEDs, assistance with ACE wraps. OT educated pt on BP management at home if required to complete ACE wrapping with TED hose once D/C. Pt reports wife can assist with task at D/C.   Other Treatments: Pt with urine incontinence upon arrival. Pt educated on maintaining toileting routine of urinating in order to decrease incontinent episodes.   Pt supine in bed with bed alarm activated, 2 bed rails up, call light within reach and 4Ps assessed.   Therapy/Group: Individual Therapy  Camie Hoe, OTD, OTR/L 03/28/2023, 12:54 PM

## 2023-03-28 NOTE — Progress Notes (Addendum)
 PROGRESS NOTE   Subjective/Complaints:   No new concerns or complaints this morning.  Patient is very anxious to go home soon.   ROS:    Pt denies fevers, HA,  SOB, abd pain, CP, N/V/C/D, and vision changes   Objective:   No results found. No results for input(s): WBC, HGB, HCT, PLT in the last 72 hours.  No results for input(s): NA, K, CL, CO2, GLUCOSE, BUN, CREATININE, CALCIUM  in the last 72 hours.   Intake/Output Summary (Last 24 hours) at 03/28/2023 1314 Last data filed at 03/28/2023 1248 Gross per 24 hour  Intake 792 ml  Output 150 ml  Net 642 ml        Physical Exam: Vital Signs Blood pressure 132/66, pulse 65, temperature 97.7 F (36.5 C), temperature source Oral, resp. rate 17, height 6' 2 (1.88 m), weight 74.8 kg, SpO2 97%.     General: awake, alert, appropriate, NAD, appears comfortable laying in bed HENT: conjugate gaze; oropharynx moist CV: regular rate; no JVD Pulmonary: CTA B/L, nonlabored breathing, on room air GI: soft, NT, ND, (+)BS Psychiatric: appropriate, pleasant Neurological: Ox3   Musculoskeletal:  Moving all 4 extremities in bed to gravity   Skin:    General: Skin is warm and dry.  Neurological:     Comments: Patient is awake alert follows commands.  Oriented to person place and time. No ankle clonus noted, no hypertonia noted   Prior Exam    Cervical back: Neck supple. No tenderness.     Comments: RUE- deltoid 4+/5; Biceps 4+/5; Triceps 4/5; WE 4+/5; Grip 5-/5 and FA 4+/5 LUE 5/5 in same muscles RLE- 5-/5 throughout LLE- 5/5 Assessment/Plan: 1. Functional deficits which require 3+ hours per day of interdisciplinary therapy in a comprehensive inpatient rehab setting. Physiatrist is providing close team supervision and 24 hour management of active medical problems listed below. Physiatrist and rehab team continue to assess barriers to  discharge/monitor patient progress toward functional and medical goals  Care Tool:  Bathing    Body parts bathed by patient: Right arm, Left lower leg, Face, Left arm, Chest, Abdomen, Front perineal area, Buttocks, Right upper leg, Left upper leg, Right lower leg         Bathing assist Assist Level: Supervision/Verbal cueing     Upper Body Dressing/Undressing Upper body dressing   What is the patient wearing?: Pull over shirt    Upper body assist Assist Level: Supervision/Verbal cueing    Lower Body Dressing/Undressing Lower body dressing      What is the patient wearing?: Underwear/pull up, Pants     Lower body assist Assist for lower body dressing: Supervision/Verbal cueing     Toileting Toileting    Toileting assist Assist for toileting: Supervision/Verbal cueing     Transfers Chair/bed transfer  Transfers assist     Chair/bed transfer assist level: Supervision/Verbal cueing     Locomotion Ambulation   Ambulation assist      Assist level: Contact Guard/Touching assist Assistive device: No Device Max distance: 150   Walk 10 feet activity   Assist     Assist level: Contact Guard/Touching assist Assistive device: No Device   Walk 50 feet activity  Assist Walk 50 feet with 2 turns activity did not occur: Safety/medical concerns (BP)  Assist level: Contact Guard/Touching assist Assistive device: No Device    Walk 150 feet activity   Assist Walk 150 feet activity did not occur: Safety/medical concerns  Assist level: Contact Guard/Touching assist Assistive device: No Device    Walk 10 feet on uneven surface  activity   Assist     Assist level: Contact Guard/Touching assist Assistive device: Other (comment) (no device.)   Wheelchair     Assist Is the patient using a wheelchair?: Yes Type of Wheelchair: Manual    Wheelchair assist level: Supervision/Verbal cueing Max wheelchair distance: 200 ft    Wheelchair 50 feet  with 2 turns activity    Assist        Assist Level: Supervision/Verbal cueing   Wheelchair 150 feet activity     Assist      Assist Level: Supervision/Verbal cueing   Blood pressure 132/66, pulse 65, temperature 97.7 F (36.5 C), temperature source Oral, resp. rate 17, height 6' 2 (1.88 m), weight 74.8 kg, SpO2 97%.  Medical Problem List and Plan: 1. 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  Soft cervical collar for comfort.             -patient may  shower             -ELOS/Goals: 8-11 days- supervision  Con't CIR PT and OT IPOC done -Discussed team conference will be Tuesday of this week -Ambulated 200 feet without assistive device CGA today! 2.  Antithrombotics: -DVT/anticoagulation:  Mechanical: Antiembolism stockings, thigh (TED hose) Bilateral lower extremities             -antiplatelet therapy: Aspirin  81 mg daily 3. Pain Management/chronic back pain: Neurontin  100 mg nightly, Cymbalta  30 mg daily  1/8- denies pain- con't regimen  1/11 denies any pain, continue to monitor 4. Mood/Behavior/Sleep: Provide emotional support             -antipsychotic agents: N/A 5. Neuropsych/cognition: This patient is capable of making decisions on his own behalf. 6. Skin/Wound Care: Routine skin checks 7. Fluids/Electrolytes/Nutrition: Routine in and outs with follow-up chemistries 8.  History of orthostatic hypotension.  ProAmatine  5 mg 3 times daily.  Patient followed by [facility] neurology  1/8- will monitor BP closely since had fall at home, likely due to low BP  1/9- will order Abd binder since had dizziness yesterday- they are doing ACE wraps as well   1/11 patient received abdominal binder, continue to monitor for dizziness  1/12 denies symptoms today, using lower extremity compression with therapy TED hose and Ace  wraps     03/28/2023   12:48 PM 03/28/2023    4:35 AM 03/27/2023    7:47 PM  Vitals with BMI  Systolic 132 118 847  Diastolic 66 56 63  Pulse 65 72 70    9.  Restless leg syndrome.  Requip  4 mg nightly 10.  Hyperlipidemia.  Crestor  11.  Diabetes mellitus with peripheral neuropathy.  Hemoglobin A1c 7.5.  Currently on SSI.  PTA patient on Trulicity  3 mg every Wednesday, Semglee 18 units daily, Tresiba FlexTouch weekly, Glucophage  1000 mg twice daily.  Resume as needed  1/8- CBGs in high 100s to 200's- will restart Metformin  500mg  BID-.  1/9- CBGs looking a little better- will give a few days before  increasing back to home dose of 1000 mg BID  1/10- CBGs 102-205- if IT IS APPROPRIATE, SUGGEST INCREASING METFORMIN  TO 1000 MG bid TOMORROw  1/11 CBGs doing better however think it is reasonable to increase to have home dose of 1000 mg twice daily  1/12 CBGs controlled, continue current regimen  CBG (last 3)  Recent Labs    03/27/23 2102 03/28/23 0606 03/28/23 1156  GLUCAP 161* 150* 141*     12.  AKI.  Follow-up chemistries  1/8- has resolved 13.  CAD with stenting.  Denies CP. Plavix  currently on hold and aspirin  has been initiated. 14.  Chronic anemia.  Continue iron supplement.  Follow-up CBC  -HGB stable at 8.2 on 1/8 15.  History of tobacco use.  Provide counseling 16. Droplet precautions- has been in hospital since 12/29- on droplet precautions the entire time- no coughing, cold S'xs- will send if can remove  1/8- off droplet precautions 17. Constipation   -1/12, no BM recorded 1/8.  Will give 34 g MiraLAX , continue Senokot at bedtime.  Will add sorbitol  as needed       LOS: 5 days A FACE TO FACE EVALUATION WAS PERFORMED  Murray Collier 03/28/2023, 1:14 PM

## 2023-03-28 NOTE — Progress Notes (Signed)
 Physical Therapy Session Note  Patient Details  Name: Harry Zamora MRN: [id number] Date of Birth: 19-Dec-1940  Today's Date: 03/28/2023 PT Individual Time: 8555-8470 PT Individual Time Calculation (min): 45 min   Short Term Goals: Week 1:  PT Short Term Goal 1 (Week 1): STG's=LTG's due to ELOS  Skilled Therapeutic Interventions/Progress Updates:      Pt supine in bed upon arrival. Pt agreeable to therpay. Pt denies any pain.   Pt wearing ted hose, donned ace bandage B LE, while supine, donned abdominal binder while seated EOB.   Vitals assessed: supine 132/68 HR 66 with ted hose and ace wraps on B, seated EOB BP without abdominal binder BP 96/52 HR 70 asymptomatic, donned abdominal binder, BP and performed seated therex while seated EOB, BP 125/64, HR 77, standing BP 67/47 HR 88, returned to sitting 139/86, pt reports urgency of need to use bathroom, pt ambulated with RW and CGA to bathroom with verbal cues provided for safety with RW with urgency, pt continent of bowel and bladder, vitals reassessed in standing BP 142/60.   Pt performed the following therex for hemodynamic stability, verabl cues provided for upright posture in correction of posterior bias.   1x10 LAQ B 1x10 seated marching B 1x10 ankle pumps B  Pt performed sit to stand x10 with 4# dowel rod in B UE to facilitate improved anterior weight shift.   Pt ambulated 1x140, 1x 170 feet with no AD and close supervision/CGA only provided to hold pt pants up, verbal cues provided for R arm swing, and maintenance of forward gaze. Pt ambulated ~100 feet with no AD and use of 1# dowel rod B with therapist facilitating increased arm swing B, pt demos improved carryover without therapist facilitation.   Pt supine in bed at end of session with all needs within reach and bed alarm on.     Therapy Documentation Precautions:  Precautions Precautions: Fall Other Brace: soft cervical collar for comfort Restrictions Weight Bearing  Restrictions Per Provider Order: No  Therapy/Group: Individual Therapy  Doreene Orris 03/28/2023, 7:50 AM

## 2023-03-29 DIAGNOSIS — G894 Chronic pain syndrome: Secondary | ICD-10-CM

## 2023-03-29 DIAGNOSIS — I951 Orthostatic hypotension: Secondary | ICD-10-CM | POA: Diagnosis not present

## 2023-03-29 DIAGNOSIS — K5901 Slow transit constipation: Secondary | ICD-10-CM

## 2023-03-29 DIAGNOSIS — S14109A Unspecified injury at unspecified level of cervical spinal cord, initial encounter: Secondary | ICD-10-CM | POA: Diagnosis not present

## 2023-03-29 LAB — GLUCOSE, CAPILLARY
Glucose-Capillary: 113 mg/dL — ABNORMAL HIGH (ref 70–99)
Glucose-Capillary: 139 mg/dL — ABNORMAL HIGH (ref 70–99)
Glucose-Capillary: 128 mg/dL — ABNORMAL HIGH (ref 70–99)
Glucose-Capillary: 131 mg/dL — ABNORMAL HIGH (ref 70–99)

## 2023-03-29 MED ORDER — SORBITOL 70 % SOLN: 60.0000 mL | Status: AC

## 2023-03-29 NOTE — Progress Notes (Signed)
 Physical Therapy Session Note  Patient Details  Name: Harry Zamora MRN: [id number] Date of Birth: 11/03/1940  Today's Date: 03/29/2023 PT Individual Time: 0805-0901 1300-1400 PT Individual Time Calculation (min): 56 min  Short Term Goals: Week 1:  PT Short Term Goal 1 (Week 1): STG's=LTG's due to ELOS  Skilled Therapeutic Interventions/Progress Updates:      Therapy Documentation Precautions:  Precautions Precautions: Fall Other Brace: soft cervical collar for comfort Restrictions Weight Bearing Restrictions Per Provider Order: No General:  Session 1: Pt received sleeping in bed and upon waking up was agreeable to therapy. Pt did not report any pain at this time. PT donned abdominal binder and bilateral TED hose and ace wrap while utilizing teach back method to emphasize importance of compression for orthostasis management.   Pt able to recall 1/3 exercises performed before standing (ankle pumps, LAQ, and seated march) and able to articulate the reason for the exercises. Pt performed sit>stand with SBA before ambulating to dayroom with RW and SBA. During stand>sit, pt let go of RW early and did not reach back for mat table, so demonstration of proper RW management and posterior reaching before sitting was given.   Pt ambulated 2 bouts (120' and 170') without AD with CGA and v/c for glut engagement and R arm swings. Towards the end of the second bout, pt started to take shorter, faster steps d/t fatigue. Pt reported 6/10 fatigue.  Pt reminded of exercises to perform prior to standing before ambulating with CGA back to room (100') with RW. Pt recalled instructions to keep RW with him and reach back for the bed before sitting. Pt doffed abdominal binder and was left semi-reclined in bed with all needs in reach.    Session 2: Pt received sleeping in bed and was agreeable to therapy. Pt denies any pain at that time. Pt moved supine>sit at EOB independently and PT donned abdominal  binder. Pt able to don shoes independently at EOB.  Pt recalled 2/3 exercises performed before standing (see above) and transferred sit>stand with RW and SBA. Pt ambulated to therapy gym with RW SBA and one seated rest break in elevator lobby, with v/c to reach back for the chair and push from the chair during sit<>stand transfers.   In therapy gym, pt performed x6 sit<>stand from compliant surface with and without 5# weight bar to increase difficulty. Pt also performed 2 sets of 6 reps of sit<>stand with feet on airex pad to challenge balance. Pt had minor posterior LOB during first rep, but was able to correct and did not have any issues with subsequent reps. Pt able to perform second set without use of support from UE. Pt also completed one set on airex pad with 5# weight bar at and RPE of 6/10.   Pt practiced sequencing of walker with 1 step per home set-up with demonstration of walker sequencing and proximity to step required for stability. Pt did well with v/c to get closer to step. Pt had minor, self-corrected LOB with turning, so turns around cones were practiced. Pt frequently crossed midline/had narrowed BOS during turning that was not changed with demonstration, v/c, or ankle weights added to decrease LE excursion.   During a seated rest break, pt reported new onset chest pain that was unrelieved with doffing of abdominal binder. Pt denied neck, jaw, or arm pain, as well as nausea. Pt's vitals were 104 bpm, 125/69 mmHg. Pt was transferred dependently back to room in w/c and then transferred supervision  back to supine in bed when RN and PA were notified. PA deferred further work-up at this time and informed pt that he would follow-up and pt should notify RN if chest pain recurs.   Pt left with alarm on, all needs in reach, and instruction of how to use alarm bell if needed.    Therapy/Group: Individual Therapy  Almarie Creed 03/29/2023, 12:25 PM  I attest that I was present and this  session was performed under the supervision of a licensed clinician, and that the documentation accurately reflects treatment performed.  Bevely Fonder, PT, DPT

## 2023-03-29 NOTE — Progress Notes (Signed)
 Occupational Therapy Session Note  Patient Details  Name: Harry Zamora MRN: [id number] Date of Birth: Feb 09, 1941  Today's Date: 03/29/2023 OT Individual Time: 1003-1100 OT Individual Time Calculation (min): 57 min    Short Term Goals: Week 1:  OT Short Term Goal 1 (Week 1): STG=LTG d/t ELOS  Skilled Therapeutic Interventions/Progress Updates:      Therapy Documentation Precautions:  Precautions Precautions: Fall Other Brace: soft cervical collar for comfort Restrictions Weight Bearing Restrictions Per Provider Order: No General: "I feel good today" Pt supine in bed upon OT arrival, agreeable to OT session. ACE wraps, TED hose and abdominal binder donned for session.  Pain: no pain reported  ADL: Pt ambulated CGA with no device from room><therapy gym with one seated rest break required d/t fatigue   Exercises: Pt participated in [facility] activity with RUE, pt retrieving small pegs with pads of fingers and placing them onto peg board for increased dexterity and coordination in order to complete ADLs such as buttoning shirts. Pt challenge upgraded by using perdue peg board pieces with tweezers. Pt with increased difficulty using tweezers with RUE with slight instability with holding tweezers shut. Pt completed tasks with PRN rest breaks d/t increased fatigue in RUE.   Other Treatments: Pt issued elastic shoe laces in order to increase independence with footwear d/t difficulty with tying shoes. OT assisted with re-lacing shoes and pt completed donning shoes SBA with elastic laces.   Pt supine in bed with bed alarm activated, 2 bed rails up, call light within reach and 4Ps assessed.   Therapy/Group: Individual Therapy  Camie Hoe, OTD, OTR/L 03/29/2023, 12:49 PM

## 2023-03-29 NOTE — Progress Notes (Signed)
 PROGRESS NOTE   Subjective/Complaints:  No problems. Pt anxious to get home. Had a good night!. Denies pain.   ROS: Patient denies fever, rash, sore throat, blurred vision, dizziness, nausea, vomiting, diarrhea, cough, shortness of breath or chest pain, joint or back/neck pain, headache, or mood change.   Objective:   No results found. No results for input(s): WBC, HGB, HCT, PLT in the last 72 hours.  No results for input(s): NA, K, CL, CO2, GLUCOSE, BUN, CREATININE, CALCIUM  in the last 72 hours.   Intake/Output Summary (Last 24 hours) at 03/29/2023 1255 Last data filed at 03/29/2023 0820 Gross per 24 hour  Intake 330 ml  Output 50 ml  Net 280 ml        Physical Exam: Vital Signs Blood pressure 112/65, pulse 74, temperature (!) 97.5 F (36.4 C), resp. rate 18, height 6' 2 (1.88 m), weight 74.8 kg, SpO2 95%.     Constitutional: No distress . Vital signs reviewed. HEENT: NCAT, EOMI, oral membranes moist Neck: supple Cardiovascular: RRR without murmur. No JVD    Respiratory/Chest: CTA Bilaterally without wheezes or rales. Normal effort    GI/Abdomen: BS +, non-tender, non-distended Ext: no clubbing, cyanosis, or edema Psych: pleasant and cooperative  Musculoskeletal:  Moving all 4 extremities in bed to gravity   Skin:    General: Skin is warm and dry.  Neurological:     Alert and oriented x 3. Normal insight and awareness. Intact Memory. Normal language and speech. Cranial nerve exam unremarkable. No ankle clonus noted, no hypertonia noted       Comments: RUE- deltoid 4+/5; Biceps 4+/5; Triceps 4+/5; WE 4+/5; Grip 55 LUE 5/5 in same muscles RLE- 5-/5 throughout LLE- 5/5   Assessment/Plan: 1. Functional deficits which require 3+ hours per day of interdisciplinary therapy in a comprehensive inpatient rehab setting. Physiatrist is providing close team supervision and 24 hour management  of active medical problems listed below. Physiatrist and rehab team continue to assess barriers to discharge/monitor patient progress toward functional and medical goals  Care Tool:  Bathing    Body parts bathed by patient: Right arm, Left lower leg, Face, Left arm, Chest, Abdomen, Front perineal area, Buttocks, Right upper leg, Left upper leg, Right lower leg         Bathing assist Assist Level: Supervision/Verbal cueing     Upper Body Dressing/Undressing Upper body dressing   What is the patient wearing?: Pull over shirt    Upper body assist Assist Level: Supervision/Verbal cueing    Lower Body Dressing/Undressing Lower body dressing      What is the patient wearing?: Underwear/pull up, Pants     Lower body assist Assist for lower body dressing: Supervision/Verbal cueing     Toileting Toileting    Toileting assist Assist for toileting: Supervision/Verbal cueing     Transfers Chair/bed transfer  Transfers assist     Chair/bed transfer assist level: Supervision/Verbal cueing     Locomotion Ambulation   Ambulation assist      Assist level: Contact Guard/Touching assist Assistive device: No Device Max distance: 150   Walk 10 feet activity   Assist     Assist level: Contact Guard/Touching assist  Assistive device: No Device   Walk 50 feet activity   Assist Walk 50 feet with 2 turns activity did not occur: Safety/medical concerns (BP)  Assist level: Contact Guard/Touching assist Assistive device: No Device    Walk 150 feet activity   Assist Walk 150 feet activity did not occur: Safety/medical concerns  Assist level: Contact Guard/Touching assist Assistive device: No Device    Walk 10 feet on uneven surface  activity   Assist     Assist level: Contact Guard/Touching assist Assistive device: Other (comment) (no device.)   Wheelchair     Assist Is the patient using a wheelchair?: Yes Type of Wheelchair: Manual     Wheelchair assist level: Supervision/Verbal cueing Max wheelchair distance: 200 ft    Wheelchair 50 feet with 2 turns activity    Assist        Assist Level: Supervision/Verbal cueing   Wheelchair 150 feet activity     Assist      Assist Level: Supervision/Verbal cueing   Blood pressure 112/65, pulse 74, temperature (!) 97.5 F (36.4 C), resp. rate 18, height 6' 2 (1.88 m), weight 74.8 kg, SpO2 95%.  Medical Problem List and Plan: 1. 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  Soft cervical collar for comfort.             -patient may  shower             -ELOS/Goals: 8-11 days- supervision  -Continue CIR therapies including PT, OT  -Discussed team conference will be Tuesday of this week -Ambulated 200 feet without assistive device CGA today! 2.  Antithrombotics: -DVT/anticoagulation:  Mechanical: Antiembolism stockings, thigh (TED hose) Bilateral lower extremities. Pt is ambulating now 200'+             -antiplatelet therapy: Aspirin  81 mg daily 3. Pain Management/chronic back pain: Neurontin  100 mg nightly, Cymbalta  30 mg daily  1/8- denies pain- con't regimen  1/11-13 denies any pain, continue to monitor 4. Mood/Behavior/Sleep: Provide emotional support             -antipsychotic agents: N/A 5. Neuropsych/cognition: This patient is capable of making decisions on his own behalf. 6. Skin/Wound Care: Routine skin checks 7. Fluids/Electrolytes/Nutrition: Routine in and outs with follow-up chemistries 8.  History of orthostatic hypotension.  ProAmatine  5 mg 3 times daily.  Patient followed by [facility] neurology  1/8- will monitor BP closely since had fall at home, likely due to low BP  1/9- will order Abd binder since had dizziness yesterday- they are doing ACE wraps as well   1/11 patient received abdominal binder,  continue to monitor for dizziness  1/12--no sx. continue lower extremity compression with therapy TED hose and Ace wraps     03/29/2023    5:22 AM 03/28/2023    7:37 PM 03/28/2023   12:48 PM  Vitals with BMI  Systolic 112 117 867  Diastolic 65 69 66  Pulse 74 69 65    9.  Restless leg syndrome.  Requip  4 mg nightly 10.  Hyperlipidemia.  Crestor  11.  Diabetes mellitus with peripheral neuropathy.  Hemoglobin A1c 7.5.  Currently on SSI.  PTA patient on Trulicity  3 mg every Wednesday, Semglee 18 units daily, Tresiba FlexTouch weekly, Glucophage  1000 mg twice daily.  Resume as needed  1/8- CBGs in high 100s to 200's- will restart Metformin  500mg  BID-.  1/9- CBGs looking a little better- will give a few days before increasing back to home dose of 1000 mg BID  1/10- CBGs 102-205- if IT IS APPROPRIATE, SUGGEST INCREASING METFORMIN  TO 1000 MG bid TOMORROw  1/11 CBGs doing better however think it is reasonable to increase to have home dose of 1000 mg twice daily  1/12-13 CBGs controlled, continue current regimen  CBG (last 3)  Recent Labs    03/28/23 2108 03/29/23 0608 03/29/23 1212  GLUCAP 104* 113* 139*     12.  AKI.  Follow-up chemistries  1/8- has resolved 13.  CAD with stenting.  Denies CP. Plavix  currently on hold and aspirin  has been initiated. 14.  Chronic anemia.  Continue iron supplement.  Follow-up CBC  -HGB stable at 8.2 on 1/8 15.  History of tobacco use.  Provide counseling 16. Droplet precautions- has been in hospital since 12/29- on droplet precautions the entire time- no coughing, cold S'xs- will send if can remove  1/8- off droplet precautions 17. Constipation   -1/13- still no bm since 1/8  -sorbitol  and SSE today       LOS: 6 days A FACE TO FACE EVALUATION WAS PERFORMED  Arthea ONEIDA Gunther 03/29/2023, 12:55 PM

## 2023-03-30 DIAGNOSIS — S14109A Unspecified injury at unspecified level of cervical spinal cord, initial encounter: Secondary | ICD-10-CM | POA: Diagnosis not present

## 2023-03-30 LAB — GLUCOSE, CAPILLARY
Glucose-Capillary: 132 mg/dL — ABNORMAL HIGH (ref 70–99)
Glucose-Capillary: 146 mg/dL — ABNORMAL HIGH (ref 70–99)
Glucose-Capillary: 105 mg/dL — ABNORMAL HIGH (ref 70–99)
Glucose-Capillary: 121 mg/dL — ABNORMAL HIGH (ref 70–99)

## 2023-03-30 MED ORDER — SENNOSIDES-DOCUSATE SODIUM 8.6-50 MG PO TABS
1.0000 | ORAL_TABLET | Freq: Two times a day (BID) | ORAL | Status: DC
  Administered 2023-03-30 – 2023-04-01 (×4): 1 via ORAL
  Filled 2023-03-30 (×4): qty 1

## 2023-03-30 MED ORDER — MIDODRINE HCL 5 MG PO TABS
10.0000 mg | ORAL_TABLET | Freq: Three times a day (TID) | ORAL | Status: DC
  Administered 2023-03-30 – 2023-04-01 (×5): 10 mg via ORAL
  Filled 2023-03-30 (×5): qty 2

## 2023-03-30 MED ORDER — ACETAMINOPHEN 325 MG PO TABS: 650.0000 mg | ORAL_TABLET | ORAL | Status: DC | PRN

## 2023-03-30 NOTE — Progress Notes (Addendum)
 PROGRESS NOTE   Subjective/Complaints:  Pt reports feels like getting stronger-  I explained I think we can get him out of hospital this week, but team conference today should tell us  exact date.   Denies pain LBM x3 yesterday- likely due to neurogenic bowel- not taking Pain meds  ROS:   Pt denies SOB, abd pain, CP, N/V/C/D, and vision changes  Objective:   No results found. No results for input(s): WBC, HGB, HCT, PLT in the last 72 hours.  No results for input(s): NA, K, CL, CO2, GLUCOSE, BUN, CREATININE, CALCIUM  in the last 72 hours.   Intake/Output Summary (Last 24 hours) at 03/30/2023 0839 Last data filed at 03/30/2023 0742 Gross per 24 hour  Intake 712 ml  Output --  Net 712 ml        Physical Exam: Vital Signs Blood pressure 112/61, pulse 76, temperature 97.9 F (36.6 C), resp. rate 18, height 6' 2 (1.88 m), weight 75.1 kg, SpO2 92%.      General: awake, alert, appropriate, sitting up in bed dozing; woke easily; NAD HENT: conjugate gaze; oropharynx moist CV: regular rate and rhythm; no JVD Pulmonary: CTA B/L; no W/R/R- good air movement GI: soft, NT, ND, (+)BS Psychiatric: appropriate Neurological: Ox3 RUE- biceps 5-/5; Triceps 4+/5; WE 4+/5; Grip 5-/5 and FA 4+/5 LUE 5-/5 throughout Musculoskeletal:  Moving all 4 extremities in bed to gravity   Skin:    General: Skin is warm and dry.  Neurological:     Alert and oriented x 3. Normal insight and awareness. Intact Memory. Normal language and speech. Cranial nerve exam unremarkable. No ankle clonus noted, no hypertonia noted       Comments: RUE- deltoid 4+/5; Biceps 4+/5; Triceps 4+/5; WE 4+/5; Grip 55 LUE 5/5 in same muscles RLE- 5-/5 throughout LLE- 5/5   Assessment/Plan: 1. Functional deficits which require 3+ hours per day of interdisciplinary therapy in a comprehensive inpatient rehab setting. Physiatrist is  providing close team supervision and 24 hour management of active medical problems listed below. Physiatrist and rehab team continue to assess barriers to discharge/monitor patient progress toward functional and medical goals  Care Tool:  Bathing    Body parts bathed by patient: Right arm, Left lower leg, Face, Left arm, Chest, Abdomen, Front perineal area, Buttocks, Right upper leg, Left upper leg, Right lower leg         Bathing assist Assist Level: Supervision/Verbal cueing     Upper Body Dressing/Undressing Upper body dressing   What is the patient wearing?: Pull over shirt    Upper body assist Assist Level: Supervision/Verbal cueing    Lower Body Dressing/Undressing Lower body dressing      What is the patient wearing?: Underwear/pull up, Pants     Lower body assist Assist for lower body dressing: Supervision/Verbal cueing     Toileting Toileting    Toileting assist Assist for toileting: Supervision/Verbal cueing     Transfers Chair/bed transfer  Transfers assist     Chair/bed transfer assist level: Supervision/Verbal cueing     Locomotion Ambulation   Ambulation assist      Assist level: Contact Guard/Touching assist Assistive device: No Device Max distance: 150  Walk 10 feet activity   Assist     Assist level: Contact Guard/Touching assist Assistive device: No Device   Walk 50 feet activity   Assist Walk 50 feet with 2 turns activity did not occur: Safety/medical concerns (BP)  Assist level: Contact Guard/Touching assist Assistive device: No Device    Walk 150 feet activity   Assist Walk 150 feet activity did not occur: Safety/medical concerns  Assist level: Contact Guard/Touching assist Assistive device: No Device    Walk 10 feet on uneven surface  activity   Assist     Assist level: Contact Guard/Touching assist Assistive device: Other (comment) (no device.)   Wheelchair     Assist Is the patient using a  wheelchair?: Yes Type of Wheelchair: Manual    Wheelchair assist level: Supervision/Verbal cueing Max wheelchair distance: 200 ft    Wheelchair 50 feet with 2 turns activity    Assist        Assist Level: Supervision/Verbal cueing   Wheelchair 150 feet activity     Assist      Assist Level: Supervision/Verbal cueing   Blood pressure 112/61, pulse 76, temperature 97.9 F (36.6 C), resp. rate 18, height 6' 2 (1.88 m), weight 75.1 kg, SpO2 92%.  Medical Problem List and Plan: 1. 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  Soft cervical collar for comfort.             -patient may  shower             -ELOS/Goals: 8-11 days- supervision  Con't CIR PT and OT  Team conference today to determine length of stay- wants to go home this week.  -Ambulated 200 feet without assistive device CGA today! 2.  Antithrombotics: -DVT/anticoagulation:  Mechanical: Antiembolism stockings, thigh (TED hose) Bilateral lower extremities. Pt is ambulating now 200'+             -antiplatelet therapy: Aspirin  81 mg daily 3. Pain Management/chronic back pain: Neurontin  100 mg nightly, Cymbalta  30 mg daily  1/8- denies pain- con't regimen  1/11-13 denies any pain, continue to monitor 4. Mood/Behavior/Sleep: Provide emotional support             -antipsychotic agents: N/A 5. Neuropsych/cognition: This patient is capable of making decisions on his own behalf. 6. Skin/Wound Care: Routine skin checks 7. Fluids/Electrolytes/Nutrition: Routine in and outs with follow-up chemistries 8.  History of orthostatic hypotension.  ProAmatine  5 mg 3 times daily.  Patient followed by [facility] neurology  1/8- will monitor BP closely since had fall at home, likely due to low BP  1/9- will order Abd binder since had dizziness yesterday- they are doing ACE wraps as  well   1/11 patient received abdominal binder, continue to monitor for dizziness  1/12--no sx. continue lower extremity compression with therapy TED hose and Ace wraps  1/14- BP doing better- still dropping, but using ACE wraps, TEDs and can use abd binder    03/30/2023    6:13 AM 03/30/2023    5:00 AM 03/29/2023    7:57 PM  Vitals with BMI  Weight  165 lbs 9 oz   BMI  21.25   Systolic 112  112  Diastolic 61  56  Pulse 76  69    9.  Restless leg syndrome.  Requip  4 mg nightly 10.  Hyperlipidemia.  Crestor  11.  Diabetes  mellitus with peripheral neuropathy.  Hemoglobin A1c 7.5.  Currently on SSI.  PTA patient on Trulicity  3 mg every Wednesday, Semglee 18 units daily, Tresiba FlexTouch weekly, Glucophage  1000 mg twice daily.  Resume as needed  1/8- CBGs in high 100s to 200's- will restart Metformin  500mg  BID-.  1/9- CBGs looking a little better- will give a few days before increasing back to home dose of 1000 mg BID  1/10- CBGs 102-205- if IT IS APPROPRIATE, SUGGEST INCREASING METFORMIN  TO 1000 MG bid TOMORROw  1/11 CBGs doing better however think it is reasonable to increase to have home dose of 1000 mg twice daily  1/12-13 CBGs controlled, continue current regimen  1/14- great control with Metformin  at home dose- - will restart Tresiba and Trulicity  at home. - hasn't needed any more Insulin /Scheduled Semglee since restarting Metformin    CBG (last 3)  Recent Labs    03/29/23 1715 03/29/23 2106 03/30/23 0615  GLUCAP 128* 131* 132*     12.  AKI.  Follow-up chemistries  1/8- has resolved 13.  CAD with stenting.  Denies CP. Plavix  currently on hold and aspirin  has been initiated. 14.  Chronic anemia.  Continue iron supplement.  Follow-up CBC  -HGB stable at 8.2 on 1/8  1/14- will recheck labs in AM 15.  History of tobacco use.  Provide counseling 16. Droplet precautions- has been in hospital since 12/29- on droplet precautions the entire time- no coughing, cold S'xs- will send if  can remove  1/8- off droplet precautions 17. Constipation   -1/13- still no bm since 1/8  -sorbitol  and SSE today  1/14- 3 BM's yesterday- willl increase Senna to 1 tab BID to try and keep him going.    I spent a total of 35   minutes on total care today- >50% coordination of care- due to  Education of patient about neurogenic bowel- as well as team conference to determine length of stay   Addendum- at team conference- pt requiring abd binder, ACE wraps and thigh high TEDs- will increase Midodrine  to help with BP.     LOS: 7 days A FACE TO FACE EVALUATION WAS PERFORMED  Semya Klinke 03/30/2023, 8:39 AM

## 2023-03-30 NOTE — Progress Notes (Signed)
 Occupational Therapy Session Note  Patient Details  Name: Harry Zamora MRN: [id number] Date of Birth: 10-12-1940  Today's Date: 03/30/2023 OT Individual Time: 9052-8954 OT Individual Time Calculation (min): 58 min    Short Term Goals: Week 1:  OT Short Term Goal 1 (Week 1): STG=LTG d/t ELOS  Skilled Therapeutic Interventions/Progress Updates:      Therapy Documentation Precautions:  Precautions Precautions: Fall Other Brace: soft cervical collar for comfort Restrictions Weight Bearing Restrictions Per Provider Order: No General: "I want to go home." Pt seated in recliner upon OT arrival, agreeable to OT.  Pain: no pain reported  ADL:Pt ambulated without AE at close supervision from room><therapy gym   Exercises: Pt completed the following exercise circuit in order to improve functional activity, strength and endurance to prepare for ADLs such as bathing. Pt completed the following exercises in seated/standing position with no noted LOB/SOB and 3x10 repetitions on each exercise: -standing marches with visual cueing for increased knee height  -sit to stand transfers with 5# dowel rod    Other Treatments: Pt completing various exercises with Rt hand in order to improve [facility] and compensate fofr numbness/tingling for increased ability to complete ADLs. Pt completing stereognosis activity with eyes closed and OT handing pt different items to identify. Pt able to identify 5/6 items with occasional VC for hinting. OT educated pt on desensitization techniques in order to decrease feelings of numbness/tingling and sensitivity.    Pt seated in recliner at end of session with W/C alarm donned, call light within reach and 4Ps assessed.    Therapy/Group: Individual Therapy  Camie Hoe, OTD, OTR/L 03/30/2023, 12:22 PM

## 2023-03-30 NOTE — Patient Care Conference (Signed)
 Inpatient RehabilitationTeam Conference and Plan of Care Update Date: 03/30/2023   Time: 11:44 AM  Patient Name: Harry Zamora [facility]      Medical Record Number: [id number]  Date of Birth: Aug 13, 1940 Sex: Male         Room/Bed: 4W15C/4W15C-01 Payor Info: Payor: HU[employer] MEDICARE / Plan: HU[employer] MEDICARE HMO / Product Type: *No Product type* /    Admit Date/Time:  03/23/2023  4:09 PM  Primary Diagnosis:  Contusion of cervical cord, initial encounter [facility])  Hospital Problems: Principal Problem:   Contusion of cervical cord, initial encounter [facility]) Active Problems:   Constipation    Expected Discharge Date: Expected Discharge Date: 04/01/23  Team Members Present: Physician leading conference: Dr. Duwaine Barrs Social Worker Present: Rhoda Clement, LCSW Nurse Present: Barnie Ronde, RN;Kamyah Wilhelmsen [place], RN PT Present: Bevely Fonder, PT OT Present: Camie Hoe, OT     Current Status/Progress Goal Weekly Team Focus  Bowel/Bladder   continent of bladder and bowel   remain continent with no accidents   bladder and vowel training every 4-6 hours    Swallow/Nutrition/ Hydration               ADL's   SBA overall   mod I   D/C planning    Mobility   supervision bed mobility, transfers, ambulation ~200 ft with RW (CGA without RW same distance)   Mod I  energy conservation with functional mobility    Communication                Safety/Cognition/ Behavioral Observations               Pain   denies pain   pain free   deep breathing and coughing exercises    Skin   hematoma same size   monitor hematoma  frequent check to the hematoma site      Discharge Planning:  Home with wife who is able to provide supervison level only. Pt is doing well and making progress in his therapies. Await recommendations of DME and follow up   Team Discussion: Contusion of cervical cord. Asymptomatic hypotension with use of abdominal binder, ace wraps and thigh high  TEDS. Neurogenetic bowel. Incontinent vs overflow. Wet briefs with therapy.  Patient on target to meet rehab goals: yes, progressing towards goals with discharge 04/01/2023  *See Care Plan and progress notes for long and short-term goals.   Revisions to Treatment Plan:  Restart bladder scans due to overflow.  Time toileting.  Increase Midodrine . Monitor labs and VS.  Teaching Needs: Medications, safety, self care, transfers, skin care, etc.  Current Barriers to Discharge: Decreased caregiver support and Neurogenic bowel and bladder  Possible Resolutions to Barriers: Family education Able to manage bowel/bladder independently Order recommended DME      Medical Summary Current Status: asymptomatic with low BP- - but using ACE wraps, abd binder and Thigh high TEDs- no pain- neurogenic bowel Sx's- slow to go- without a lot of bowel meds- incontinent with urine-per OT  Barriers to Discharge: Hypotension;Neurogenic Bowel & Bladder;Self-care education;Incontinence  Barriers to Discharge Comments: limited by bladder inconitnence-changign clothes- needs briefs- Possible Resolutions to [employer] Focus: limited by bladder issues- will check with nursing about bladder scans/timed voiding- d/c-1/16   Continued Need for Acute Rehabilitation Level of Care: The patient requires daily medical management by a physician with specialized training in physical medicine and rehabilitation for the following reasons: Direction of a multidisciplinary physical rehabilitation program to maximize functional independence : Yes Medical  management of patient stability for increased activity during participation in an intensive rehabilitation regime.: Yes Analysis of laboratory values and/or radiology reports with any subsequent need for medication adjustment and/or medical intervention. : Yes   I attest that I was present, lead the team conference, and concur with the assessment and plan of the  team.   Harry Zamora 03/30/2023, 8:46 PM

## 2023-03-30 NOTE — Progress Notes (Signed)
 Physical Therapy Session Note  Patient Details  Name: Harry Zamora MRN: [id number] Date of Birth: 10-17-1940  Today's Date: 03/30/2023 PT Missed Time: 45 Minutes Missed Time Reason: Patient fatigue;Patient unwilling to participate  Short Term Goals: Week 1:  PT Short Term Goal 1 (Week 1): STG's=LTG's due to ELOS  Skilled Therapeutic Interventions/Progress Updates:      Therapy Documentation Precautions:  Precautions Precautions: Fall Other Brace: soft cervical collar for comfort Restrictions Weight Bearing Restrictions Per Provider Order: No General: PT Amount of Missed Time (min): 45 Minutes PT Missed Treatment Reason: Patient fatigue;Patient unwilling to participate  Pt missed 45 minutes of skilled PT 2/2 fatigue, plan to make up missed minutes as able.     Therapy/Group: Individual Therapy  Bevely Fonder Bevely Fonder PT, DPT  03/30/2023, 4:15 PM

## 2023-03-30 NOTE — Progress Notes (Signed)
 Physical Therapy Session Note  Patient Details  Name: Harry Zamora MRN: [id number] Date of Birth: 1940/08/28  Today's Date: 03/30/2023 PT Individual Time: 9169-9069 and 1300-1345 PT Individual Time Calculation (min): 60 min and 45 min.  Short Term Goals: Week 1:  PT Short Term Goal 1 (Week 1): STG's=LTG's due to ELOS  Skilled Therapeutic Interventions/Progress Updates:   First session:  Pt presents supine in bed, just returned from [facility] w/ nursing.  THT, ace wraps donned in supine.  Pt transferred sup to sit w/ supervision.  Abd binder donned in sitting.  BP at 117/81 in sitting.  Pt able to recall 2/3 LE exercises to perform before standing.  Pt stood but needed to sit 2/2 fatigue before getting BP reading, but monitored at 95/85?  Pt amb 180' w/ RW to dayroom, BP 106/54 and no c/o symptoms.  Pt performed multiple blocks sit to stand w/ improved forward lean, 5 x 5.  Pt performed sit to stand on [employer] cushion w/ increased cueing for initiation.  Pt amb multiple trials w/o AD and CGA, cues for BOS and speed.  Pt returned to room and remained sitting in recliner w/ LES elevated and all needs in reach.    Second session: Pt presents sitting in recliner and agreeable to therapy.  Pt transfers sit to stand w/ sup to mod I w/ improved forward scoot and lean w/o cueing, translating to lack of retropulsion and WB through toes.  Pt amb multiple trials w/o AD and close supervision w/o LOB.  Pt performed stepping over hurdles w/o LOB x 3.  Pt transferred in/out of car at SUV height w/ supervision.  Pt negotiated ramp and then into mulch pit w/ CGA/supervision and step down 6 step w/o UE support.  Pt returned to room and transferred sit to supine w/ Independence.  Bed alarm on and all needs in reach.     Therapy Documentation Precautions:  Precautions Precautions: Fall Other Brace: soft cervical collar for comfort Restrictions Weight Bearing Restrictions Per Provider Order: No General:   Vital  Signs: Therapy Vitals Temp: 97.9 F (36.6 C) Pulse Rate: 76 Resp: 18 BP: 112/61 Patient Position (if appropriate): Lying Oxygen Therapy SpO2: 92 % O2 Device: Room Air Pain:0/10 Pain Assessment Pain Scale: 0-10 Pain Score: 0-No pain    Therapy/Group: Individual Therapy  Fatoumata Albaugh P Marlys Stegmaier 03/30/2023, 9:33 AM

## 2023-03-30 NOTE — Progress Notes (Signed)
 Patient ID: Harry Zamora, male   DOB: 08-07-1940, 83 y.o.   MRN: [id number] Met with pt to give team conference update regarding goals of mod/I level and discharge 1/16. Discussed if has rolling walker he will have stepson check in the attic to see. Center well has a referral from acute have let them know of his discharge. Will check tomorrow regarding if has rolling walker.

## 2023-03-31 ENCOUNTER — Other Ambulatory Visit (HOSPITAL_COMMUNITY): Payer: Self-pay

## 2023-03-31 DIAGNOSIS — S14109A Unspecified injury at unspecified level of cervical spinal cord, initial encounter: Secondary | ICD-10-CM | POA: Diagnosis not present

## 2023-03-31 LAB — CBC WITH DIFFERENTIAL/PLATELET
Abs Immature Granulocytes: 0.04 10*3/uL (ref 0.00–0.07)
Basophils Absolute: 0 10*3/uL (ref 0.0–0.1)
Basophils Relative: 1 %
Eosinophils Absolute: 0.5 10*3/uL (ref 0.0–0.5)
Eosinophils Relative: 6 %
HCT: 29.8 % — ABNORMAL LOW (ref 39.0–52.0)
Hemoglobin: 9.7 g/dL — ABNORMAL LOW (ref 13.0–17.0)
Immature Granulocytes: 1 %
Lymphocytes Relative: 32 %
Lymphs Abs: 2.5 10*3/uL (ref 0.7–4.0)
MCH: 31.1 pg (ref 26.0–34.0)
MCHC: 32.6 g/dL (ref 30.0–36.0)
MCV: 95.5 fL (ref 80.0–100.0)
Monocytes Absolute: 0.7 10*3/uL (ref 0.1–1.0)
Monocytes Relative: 9 %
Neutro Abs: 4 10*3/uL (ref 1.7–7.7)
Neutrophils Relative %: 51 %
Platelets: 425 10*3/uL — ABNORMAL HIGH (ref 150–400)
RBC: 3.12 MIL/uL — ABNORMAL LOW (ref 4.22–5.81)
RDW: 14.1 % (ref 11.5–15.5)
WBC: 7.7 10*3/uL (ref 4.0–10.5)
nRBC: 0 % (ref 0.0–0.2)

## 2023-03-31 LAB — COMPREHENSIVE METABOLIC PANEL
ALT: 13 U/L (ref 0–44)
AST: 18 U/L (ref 15–41)
Albumin: 3 g/dL — ABNORMAL LOW (ref 3.5–5.0)
Alkaline Phosphatase: 70 U/L (ref 38–126)
Anion gap: 12 (ref 5–15)
BUN: 27 mg/dL — ABNORMAL HIGH (ref 8–23)
CO2: 28 mmol/L (ref 22–32)
Calcium: 9.3 mg/dL (ref 8.9–10.3)
Chloride: 97 mmol/L — ABNORMAL LOW (ref 98–111)
Creatinine, Ser: 1.48 mg/dL — ABNORMAL HIGH (ref 0.61–1.24)
GFR, Estimated: 47 mL/min — ABNORMAL LOW (ref >60)
Glucose, Bld: 132 mg/dL — ABNORMAL HIGH (ref 70–99)
Potassium: 4.8 mmol/L (ref 3.5–5.1)
Sodium: 137 mmol/L (ref 135–145)
Total Bilirubin: 1.3 mg/dL — ABNORMAL HIGH (ref 0.0–1.2)
Total Protein: 6.9 g/dL (ref 6.5–8.1)

## 2023-03-31 LAB — GLUCOSE, CAPILLARY
Glucose-Capillary: 118 mg/dL — ABNORMAL HIGH (ref 70–99)
Glucose-Capillary: 113 mg/dL — ABNORMAL HIGH (ref 70–99)
Glucose-Capillary: 124 mg/dL — ABNORMAL HIGH (ref 70–99)
Glucose-Capillary: 76 mg/dL (ref 70–99)

## 2023-03-31 MED ORDER — MIDODRINE HCL 5 MG PO TABS
5.0000 mg | ORAL_TABLET | Freq: Three times a day (TID) | ORAL | 0 refills | Status: DC
  Filled 2023-03-31: qty 90, 30d supply, fill #0

## 2023-03-31 MED ORDER — GABAPENTIN 100 MG PO CAPS
100.0000 mg | ORAL_CAPSULE | Freq: Every day | ORAL | 0 refills | Status: AC
  Filled 2023-03-31: qty 30, 30d supply, fill #0

## 2023-03-31 MED ORDER — PANTOPRAZOLE SODIUM 40 MG PO TBEC
40.0000 mg | DELAYED_RELEASE_TABLET | Freq: Every day | ORAL | 0 refills | Status: AC
  Filled 2023-03-31: qty 30, 30d supply, fill #0

## 2023-03-31 MED ORDER — VITAMIN C 500 MG PO TABS
500.0000 mg | ORAL_TABLET | Freq: Every day | ORAL | 0 refills | Status: DC
  Filled 2023-03-31: qty 30, 30d supply, fill #0

## 2023-03-31 MED ORDER — ROPINIROLE HCL 2 MG PO TABS
4.0000 mg | ORAL_TABLET | Freq: Every day | ORAL | 0 refills | Status: AC
  Filled 2023-03-31: qty 60, 30d supply, fill #0

## 2023-03-31 MED ORDER — DULOXETINE HCL 30 MG PO CPEP
30.0000 mg | ORAL_CAPSULE | Freq: Every day | ORAL | 0 refills | Status: DC
  Filled 2023-03-31: qty 30, 30d supply, fill #0

## 2023-03-31 MED ORDER — ROSUVASTATIN CALCIUM 20 MG PO TABS
20.0000 mg | ORAL_TABLET | Freq: Every day | ORAL | 0 refills | Status: DC
  Filled 2023-03-31: qty 30, 30d supply, fill #0

## 2023-03-31 MED ORDER — FERROUS SULFATE 325 (65 FE) MG PO TABS
325.0000 mg | ORAL_TABLET | Freq: Every day | ORAL | 0 refills | Status: AC
  Filled 2023-03-31: qty 30, 30d supply, fill #0

## 2023-03-31 MED ORDER — METFORMIN HCL 1000 MG PO TABS
1000.0000 mg | ORAL_TABLET | Freq: Two times a day (BID) | ORAL | 0 refills | Status: AC
  Filled 2023-03-31: qty 60, 30d supply, fill #0

## 2023-03-31 MED ORDER — CYANOCOBALAMIN 1000 MCG PO TABS
1000.0000 ug | ORAL_TABLET | Freq: Every day | ORAL | 0 refills | Status: DC
  Filled 2023-03-31: qty 30, 30d supply, fill #0

## 2023-03-31 NOTE — Progress Notes (Signed)
 Inpatient Rehabilitation Discharge Medication Review by a Pharmacist  A complete drug regimen review was completed for this patient to identify any potential clinically significant medication issues.  High Risk Drug Classes Is patient taking? Indication by Medication  Antipsychotic No   Anticoagulant No   Antibiotic No   Opioid No   Antiplatelet Yes Aspirin  81 mg - CVA prophylaxis, CAD with prior stent  Hypoglycemics/insulin  Yes Metformin /insulin  glargine, Trulicity  - DM Type 2  Vasoactive Medication Yes Midodrine  - hypotension Nitroglycerin  - CP  Chemotherapy No   Other Yes Duloxetine , Gabapentin  - neuropathic pain Pantoprazole  - GERD Ropinirole  - restless leg  Rosuvastatin  - hyperlipidemia Ferrous sulfate , calcium , Vitamin B-12, MVI- supplements Neurontin  - neuropathy     Type of Medication Issue Identified Description of Issue Recommendation(s)  Drug Interaction(s) (clinically significant)     Duplicate Therapy     Allergy     No Medication Administration End Date     Incorrect Dose     Additional Drug Therapy Needed     Significant med changes from prior encounter (inform family/care partners about these prior to discharge).     Other       Clinically significant medication issues were identified that warrant physician communication and completion of prescribed/recommended actions by midnight of the next day:  No  Pharmacist comments:   Time spent performing this drug regimen review (minutes):  [address], PharmD, [place], AAHIVP, CPP Infectious Disease Pharmacist 03/31/2023 7:36 AM

## 2023-03-31 NOTE — Progress Notes (Signed)
 Physical Therapy Session Note  Patient Details  Name: DELDRICK Zamora MRN: [id number] Date of Birth: [birth year]  Today's Date: 03/31/2023 PT Missed Time: 60 Minutes Missed Time Reason: Patient fatigue;Patient unwilling to participate  Short Term Goals: Week 1:  PT Short Term Goal 1 (Week 1): STG's=LTG's due to ELOS  Skilled Therapeutic Interventions/Progress Updates:      Therapy Documentation Precautions:  Precautions Precautions: Fall Precaution Comments: chronic OH Required Braces or Orthoses: Other Brace Other Brace: soft cervical collar for comfort Restrictions Weight Bearing Restrictions Per Provider Order: No General: PT Amount of Missed Time (min): 60 Minutes PT Missed Treatment Reason: Patient fatigue;Patient unwilling to participate  Pt politely declined PT 2/2 fatigue, pt missed 60 minutes of skilled therapy and planned to make up missed minutes as able.     Therapy/Group: Individual Therapy  Gladystine Lamprey Gladystine Lamprey PT, DPT  03/31/2023, 3:46 PM

## 2023-03-31 NOTE — Plan of Care (Signed)
  Problem: Consults Goal: RH SPINAL CORD INJURY PATIENT EDUCATION Description:  See Patient Education module for education specifics.  Outcome: Progressing   Problem: SCI BOWEL ELIMINATION Goal: RH STG MANAGE BOWEL WITH ASSISTANCE Description: STG Manage Bowel with mod I Assistance. Outcome: Progressing Goal: RH STG SCI MANAGE BOWEL WITH MEDICATION WITH ASSISTANCE Description: STG SCI Manage bowel with medication with mod I assistance. Outcome: Progressing   Problem: RH SKIN INTEGRITY Goal: RH STG SKIN FREE OF INFECTION/BREAKDOWN Description: Manage skin independently Outcome: Progressing   Problem: RH KNOWLEDGE DEFICIT SCI Goal: RH STG INCREASE KNOWLEDGE OF SELF CARE AFTER SCI Outcome: Progressing

## 2023-03-31 NOTE — Progress Notes (Signed)
 Occupational Therapy Discharge Summary  Patient Details  Name: Harry Zamora MRN: [id number] Date of Birth: 22-Apr-1940  Date of Discharge from OT service:March 31, 2023  Today's Date: 03/31/2023 OT Individual Time: 7829-5621  OT Individual Time Calculation (min): 73 min  Missed minutes: 30 min d/t pt fatigue/unwilling to participate without medical reason   Patient has met 7 of 7 long term goals due to improved activity tolerance, improved balance, postural control, ability to compensate for deficits, functional use of  RIGHT upper extremity, and improved coordination.  Patient to discharge at overall Modified Independent level.  Patient's care partner is independent to provide the necessary intermittent supervision assistance at discharge.    Reasons goals not met: All goals met  Recommendation:  Patient will benefit from ongoing skilled OT services in home health setting to continue to advance functional skills in the area of iADL and Reduce care partner burden.  Equipment: No equipment provided  Reasons for discharge: treatment goals met and discharge from hospital  Patient/family agrees with progress made and goals achieved: Yes  OT Discharge Precautions/Restrictions  Precautions Precautions: Fall Precaution Comments: chronic OH Required Braces or Orthoses: Other Brace Other Brace: soft cervical collar for comfort Restrictions Weight Bearing Restrictions Per Provider Order: No Pain Pain Assessment Pain Scale: 0-10 Pain Score: 0-No pain ADL ADL Eating: Modified independent Where Assessed-Eating: Bed level Grooming: Modified independent Where Assessed-Grooming: Sitting at sink Upper Body Bathing: Modified independent Where Assessed-Upper Body Bathing: Shower Lower Body Bathing: Modified independent Where Assessed-Lower Body Bathing: Shower Upper Body Dressing: Modified independent (Device) Where Assessed-Upper Body Dressing: Edge of bed Lower Body Dressing:  Modified independent Where Assessed-Lower Body Dressing: Sitting at sink, Standing at sink Toileting: Modified independent Where Assessed-Toileting: Teacher, adult education: Engineer, agricultural Method: Insurance claims handler: Modified independent Web designer Method: Ship broker: Walk in shower, Grab bars, Insurance underwriter: Modified independent Film/video editor Method: Designer, industrial/product: Information systems manager with back Vision Baseline Vision/History: 1 Wears glasses Patient Visual Report: No change from baseline Vision Assessment?: No apparent visual deficits;Wears glasses for reading;Wears glasses for driving Perception  Perception: Within Functional Limits Praxis Praxis: WFL Cognition Cognition Overall Cognitive Status: Within Functional Limits for tasks assessed Arousal/Alertness: Awake/alert Orientation Level: Person;Situation;Place Person: Oriented Place: Oriented Situation: Oriented Memory: Appears intact Awareness: Appears intact Problem Solving: Appears intact Safety/Judgment: Appears intact Brief Interview for Mental Status (BIMS) Repetition of Three Words (First Attempt): 3 Temporal Orientation: Year: Correct Temporal Orientation: Month: Accurate within 5 days Temporal Orientation: Day: Correct Recall: "Sock": Yes, no cue required Recall: "Blue": Yes, no cue required Recall: "Bed": Yes, no cue required BIMS Summary Score: 15 Sensation Sensation Light Touch: Appears Intact Hot/Cold: Appears Intact Proprioception: Appears Intact Stereognosis: Not tested Additional Comments: RUE hand numbness/tingling since eval, does not hinder ability to complete ADLs Coordination Gross Motor Movements are Fluid and Coordinated: Yes Fine Motor Movements are Fluid and Coordinated: Yes Motor  Motor Motor: Within Functional Limits Motor - Discharge Observations: [facility] Mobility  Bed  Mobility Bed Mobility: Rolling Right;Rolling Left;Supine to Sit;Sit to Supine Rolling Right: Independent with assistive device Rolling Left: Independent with assistive device Supine to Sit: Independent with assistive device Sit to Supine: Independent with assistive device Transfers Sit to Stand: Independent with assistive device Stand to Sit: Independent with assistive device  Trunk/Postural Assessment  Cervical Assessment Cervical Assessment: Within Functional Limits Thoracic Assessment Thoracic Assessment: Within Functional Limits Lumbar Assessment Lumbar Assessment: Within Functional Limits Postural Control Postural  Control: Within Functional Limits  Balance Balance Balance Assessed: Yes Static Sitting Balance Static Sitting - Balance Support: Feet supported;No upper extremity supported Static Sitting - Level of Assistance: 6: Modified independent (Device/Increase time) Dynamic Sitting Balance Dynamic Sitting - Balance Support: Feet supported;During functional activity Dynamic Sitting - Level of Assistance: 6: Modified independent (Device/Increase time) Dynamic Sitting - Balance Activities: Reaching for objects;Forward lean/weight shifting Static Standing Balance Static Standing - Balance Support: Bilateral upper extremity supported Static Standing - Level of Assistance: 6: Modified independent (Device/Increase time) Dynamic Standing Balance Dynamic Standing - Balance Support: During functional activity Dynamic Standing - Level of Assistance: 6: Modified independent (Device/Increase time) Dynamic Standing - Balance Activities: Reaching for objects Extremity/Trunk Assessment RUE Assessment RUE Assessment: Within Functional Limits Active Range of Motion (AROM) Comments: WFL General Strength Comments: reports numbness in RUE, strength WFL 5/5 LUE Assessment LUE Assessment: Within Functional Limits Active Range of Motion (AROM) Comments: WFL General Strength Comments: 5/5  overall  Session 1 General: "I didn't think I had therapy today." Pt supine in bed upon OT arrival, agreeable to OT session.  Pain: see above  ADL: Pt completed ADL today with levels listed above. Pt able to don shoes in standing using grab bar for stability. OT assisted with donning TED hose and ACE wraps. Pt reported wife will assist with task once home. Pt completed shaving and teeth brushing in sitting before shower with safe techniques used. Pt seated for shower using lateral leans for peri hygiene. Pt able to gather clothing for change of clothes after shower. Pt using RW with functional mobility around room and to/from therapy gym with no LOB/SOB.   Exercises: Pt issued UE theraband HEP in order to increase functional strength, andurance and activity tolerance in order to increase independence in ADLs such as bathing. Pt issued yellow theraband and discussed direction/technique of exercises, demonstrating verbal understanding. Pt completed 3x10 exercises at bed level listed below: -elbow extensions - shoulder horizontal abduction -bicep curls  -shoulder flexion -diagonal shoulder flexion -external rotation   Pt recieved foam block hand home exercise program in order to increase strength/ROM of Rt hand. Pt educated on purpose of exercises and reps/sets to complete. Pt given blue foam block and instructed on the following exercises:  -power grip -key pinch -pincer grasp -three finger pinch -thumb flexion -PIP grip   Pt seated in recliner at end of session with W/C alarm donned, call light within reach and 4Ps assessed.    Session 2 General: Pt supine in bed fatigued from prior therapy sessions. Pt refusal to participate. Pt educated importance of participation in therapy, pt politely declined. Will make up missed minutes as pt schedule allows.   Nila Barth, OTD, OTR/L 03/31/2023, 3:50 PM

## 2023-03-31 NOTE — Progress Notes (Signed)
 PROGRESS NOTE   Subjective/Complaints:  Pt reports BP doing well No bladder accidents in last 24 hours voiding q2 hours and going well  ROS:   Pt denies SOB, abd pain, CP, N/V/C/D, and vision changes  Objective:   No results found. Recent Labs    03/31/23 0530  WBC 7.7  HGB 9.7*  HCT 29.8*  PLT 425*    Recent Labs    03/31/23 0530  NA 137  K 4.8  CL 97*  CO2 28  GLUCOSE 132*  BUN 27*  CREATININE 1.48*  CALCIUM  9.3     Intake/Output Summary (Last 24 hours) at 03/31/2023 0949 Last data filed at 03/30/2023 1817 Gross per 24 hour  Intake 476 ml  Output 0 ml  Net 476 ml        Physical Exam: Vital Signs Blood pressure (!) 102/57, pulse 72, temperature 97.8 F (36.6 C), resp. rate 18, height 6\' 2"  (1.88 m), weight 75.1 kg, SpO2 94%.       General: awake, alert, appropriate, sitting up in bed; sleepy; NAD HENT: conjugate gaze; oropharynx moist CV: regular rate and rhythm no JVD Pulmonary: CTA B/L; no W/R/R- good air movement GI: soft, NT, ND, (+)BS Psychiatric: appropriate- a little vague Neurological: Ox3- flat  RUE- biceps 5-/5; Triceps 4+/5; WE 4+/5; Grip 5-/5 and FA 4+/5 LUE 5-/5 throughout Musculoskeletal:  Moving all 4 extremities in bed to gravity   Skin:    General: Skin is warm and dry.  Neurological:     Alert and oriented x 3. Normal insight and awareness. Intact Memory. Normal language and speech. Cranial nerve exam unremarkable. No ankle clonus noted, no hypertonia noted       Comments: RUE- deltoid 4+/5; Biceps 4+/5; Triceps 4+/5; WE 4+/5; Grip 55 LUE 5/5 in same muscles RLE- 5-/5 throughout LLE- 5/5   Assessment/Plan: 1. Functional deficits which require 3+ hours per day of interdisciplinary therapy in a comprehensive inpatient rehab setting. Physiatrist is providing close team supervision and 24 hour management of active medical problems listed below. Physiatrist and  rehab team continue to assess barriers to discharge/monitor patient progress toward functional and medical goals  Care Tool:  Bathing    Body parts bathed by patient: Right arm, Left lower leg, Face, Left arm, Chest, Abdomen, Front perineal area, Buttocks, Right upper leg, Left upper leg, Right lower leg         Bathing assist Assist Level: Independent with assistive device     Upper Body Dressing/Undressing Upper body dressing   What is the patient wearing?: Pull over shirt    Upper body assist Assist Level: Independent with assistive device    Lower Body Dressing/Undressing Lower body dressing      What is the patient wearing?: Underwear/pull up, Pants     Lower body assist Assist for lower body dressing: Independent with assitive device     Toileting Toileting    Toileting assist Assist for toileting: Independent with assistive device     Transfers Chair/bed transfer  Transfers assist     Chair/bed transfer assist level: Independent with assistive device     Locomotion Ambulation   Ambulation assist  Assist level: Supervision/Verbal cueing Assistive device: No Device Max distance: 200   Walk 10 feet activity   Assist     Assist level: Supervision/Verbal cueing Assistive device: No Device   Walk 50 feet activity   Assist Walk 50 feet with 2 turns activity did not occur: Safety/medical concerns (BP)  Assist level: Supervision/Verbal cueing Assistive device: No Device    Walk 150 feet activity   Assist Walk 150 feet activity did not occur: Safety/medical concerns  Assist level: Supervision/Verbal cueing Assistive device: No Device    Walk 10 feet on uneven surface  activity   Assist     Assist level: Contact Guard/Touching assist Assistive device: Other (comment) (no device.)   Wheelchair     Assist Is the patient using a wheelchair?: Yes Type of Wheelchair: Manual    Wheelchair assist level: Supervision/Verbal  cueing Max wheelchair distance: 200 ft    Wheelchair 50 feet with 2 turns activity    Assist        Assist Level: Supervision/Verbal cueing   Wheelchair 150 feet activity     Assist      Assist Level: Supervision/Verbal cueing   Blood pressure (!) 102/57, pulse 72, temperature 97.8 F (36.6 C), resp. rate 18, height 6\' 2"  (1.88 m), weight 75.1 kg, SpO2 94%.  Medical Problem List and Plan: 1. 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  Soft cervical collar for comfort.             -patient may  shower             -ELOS/Goals: 8-11 days- supervision  D/c 1/16  Con't CIR PT and OT 2.  Antithrombotics: -DVT/anticoagulation:  Mechanical: Antiembolism stockings, thigh (TED hose) Bilateral lower extremities. Pt is ambulating now 200'+             -antiplatelet therapy: Aspirin  81 mg daily 3. Pain Management/chronic back pain: Neurontin  100 mg nightly, Cymbalta  30 mg daily  1/8- denies pain- con't regimen  1/11-13 denies any pain, continue to monitor 4. Mood/Behavior/Sleep: Provide emotional support             -antipsychotic agents: N/A 5. Neuropsych/cognition: This patient is capable of making decisions on his own behalf. 6. Skin/Wound Care: Routine skin checks 7. Fluids/Electrolytes/Nutrition: Routine in and outs with follow-up chemistries 8.  History of orthostatic hypotension.  ProAmatine  5 mg 3 times daily.  Patient followed by [facility] neurology  1/8- will monitor BP closely since had fall at home, likely due to low BP  1/9- will order Abd binder since had dizziness yesterday- they are doing ACE wraps as well   1/11 patient received abdominal binder, continue to monitor for dizziness  1/12--no sx. continue lower extremity compression with therapy TED hose and Ace wraps  1/14- BP doing better- still dropping, but using  ACE wraps, TEDs and can use abd binder  1/15- increased midodrine  to 10 mg TID so can reduce his Abd binder/TEDs and ACE wraps- feeling good this AM    03/31/2023    4:44 AM 03/30/2023    7:54 PM 03/30/2023    1:59 PM  Vitals with BMI  Systolic 102 113 161  Diastolic 57 60 55  Pulse 72 67 68    9.  Restless leg syndrome.  Requip  4 mg nightly 10.  Hyperlipidemia.  Crestor  11.  Diabetes mellitus with  peripheral neuropathy.  Hemoglobin A1c 7.5.  Currently on SSI.  PTA patient on Trulicity  3 mg every Wednesday, Semglee 18 units daily, Tresiba FlexTouch weekly, Glucophage  1000 mg twice daily.  Resume as needed  1/8- CBGs in high 100s to 200's- will restart Metformin  500mg  BID-.  1/9- CBGs looking a little better- will give a few days before increasing back to home dose of 1000 mg BID  1/10- CBGs 102-205- if IT IS APPROPRIATE, SUGGEST INCREASING METFORMIN  TO 1000 MG bid TOMORROw  1/11 CBGs doing better however think it is reasonable to increase to have home dose of 1000 mg twice daily  1/12-13 CBGs controlled, continue current regimen  1/14- great control with Metformin  at home dose- - will restart Tresiba and Trulicity  at home. - hasn't needed any more Insulin /Scheduled Semglee since restarting Metformin    1/15- BG's looking well- con't regimen CBG (last 3)  Recent Labs    03/30/23 1710 03/30/23 2038 03/31/23 0534  GLUCAP 105* 121* 118*     12.  AKI.  Follow-up chemistries  1/8- has resolved 13.  CAD with stenting.  Denies CP. Plavix  currently on hold and aspirin  has been initiated. 14.  Chronic anemia.  Continue iron supplement.  Follow-up CBC  -HGB stable at 8.2 on 1/8  1/14- will recheck labs in AM 15.  History of tobacco use.  Provide counseling 16. Droplet precautions- has been in hospital since 12/29- on droplet precautions the entire time- no coughing, cold S'xs- will send if can remove  1/8- off droplet precautions 17. Constipation   -1/13- still no bm since 1/8  -sorbitol   and SSE today  1/14- 3 BM's yesterday- willl increase Senna to 1 tab BID to try and keep him going.  18. Neurogenic bladder- mild  1/15- was having bladder incontinence per therapy- however not documented like that and PVRs 0- is voiding q2 hours per pt       LOS: 8 days A FACE TO FACE EVALUATION WAS PERFORMED  Harry Zamora 03/31/2023, 9:49 AM

## 2023-03-31 NOTE — Progress Notes (Signed)
 Patient ID: Harry Zamora, male   DOB: 1940-12-22, 83 y.o.   MRN: [id number] Have ordered pt a tall rolling walker due to does not have one. Have made referral to [employer] for rolling walker.

## 2023-03-31 NOTE — Progress Notes (Signed)
 Physical Therapy Discharge Summary  Patient Details  Name: Harry Zamora MRN: [id number] Date of Birth: 1940-10-05  Date of Discharge from PT service:March 31, 2023  Today's Date: 03/31/2023 PT Individual Time: 0918-1000 PT Individual Time Calculation (min): 42 min    Patient has met 7 of 7 long term goals due to improved activity tolerance, improved balance, improved postural control, increased strength, increased range of motion, improved awareness, and improved coordination.  Patient to discharge at an ambulatory level Modified Independent.     Reasons goals not met: N/A   Recommendation:  Patient will benefit from ongoing skilled PT services in outpatient setting to continue to advance safe functional mobility, address ongoing impairments in high level balance training, and minimize fall risk.  Equipment: RW  Reasons for discharge: treatment goals met and discharge from hospital  Patient/family agrees with progress made and goals achieved: Yes  PT Discharge Pain Interference Pain Interference Pain Effect on Sleep: 1. Rarely or not at all Pain Interference with Therapy Activities: 1. Rarely or not at all Pain Interference with Day-to-Day Activities: 1. Rarely or not at all Cognition Overall Cognitive Status: Within Functional Limits for tasks assessed Arousal/Alertness: Awake/alert Orientation Level: Oriented X4 Memory: Appears intact Awareness: Appears intact Problem Solving: Appears intact Safety/Judgment: Appears intact Sensation Sensation Light Touch: Impaired Detail Peripheral sensation comments: right wrist/hand numbness since fall Proprioception: Appears Intact Additional Comments: RUE hand numbness/tingling since eval, does not hinder ability to complete ADLs Coordination Gross Motor Movements are Fluid and Coordinated: Yes Fine Motor Movements are Fluid and Coordinated: Yes Finger Nose Finger Test: [facility] Heel Shin Test: [facility] Motor  Motor Motor: Within  Functional Limits Motor - Skilled Clinical Observations: WFL  Mobility Bed Mobility Bed Mobility: Rolling Right;Rolling Left;Supine to Sit;Sit to Supine Rolling Right: Independent with assistive device Rolling Left: Independent with assistive device Supine to Sit: Independent with assistive device Sit to Supine: Independent with assistive device Transfers Transfers: Sit to Stand;Stand to Sit;Stand Pivot Transfers Sit to Stand: Independent with assistive device Stand to Sit: Independent with assistive device Stand Pivot Transfers: Independent with assistive device Transfer (Assistive device): Rolling walker Locomotion  Gait Ambulation: Yes Gait Assistance: Independent with assistive device Gait Distance (Feet): 200 Feet Assistive device: Rolling walker Gait Gait: Yes Gait Pattern: Within Functional Limits Gait velocity: WFL Stairs / Additional Locomotion Stairs: Yes Stairs Assistance: Independent with assistive device Stair Management Technique: Two rails Number of Stairs: 12 Height of Stairs: 6 (inches) Ramp: Independent with assistive device Curb: Independent with assistive device Pick up small object from the floor assist level: Independent with assistive device Pick up small object from the floor assistive device: none Wheelchair Mobility Wheelchair Mobility: No  Trunk/Postural Assessment  Cervical Assessment Cervical Assessment: Within Functional Limits Thoracic Assessment Thoracic Assessment: Within Functional Limits Lumbar Assessment Lumbar Assessment: Within Functional Limits Postural Control Postural Control: Within Functional Limits  Balance Balance Balance Assessed: Yes Static Sitting Balance Static Sitting - Balance Support: Feet supported;No upper extremity supported Static Sitting - Level of Assistance: 6: Modified independent (Device/Increase time) Dynamic Sitting Balance Dynamic Sitting - Balance Support: Feet supported;During functional  activity Dynamic Sitting - Level of Assistance: 6: Modified independent (Device/Increase time) Dynamic Sitting - Balance Activities: Reaching for objects;Forward lean/weight shifting Static Standing Balance Static Standing - Balance Support: Bilateral upper extremity supported Static Standing - Level of Assistance: 6: Modified independent (Device/Increase time) Dynamic Standing Balance Dynamic Standing - Balance Support: During functional activity Dynamic Standing - Level of Assistance: 6: Modified independent (Device/Increase  time) Dynamic Standing - Balance Activities: Reaching for objects Extremity Assessment  RLE Assessment RLE Assessment: Within Functional Limits LLE Assessment LLE Assessment: Within Functional Limits   Pt agreeable to PT session with emphasis on mobility training and postural NMR. Pt declines pain, PT assessed pain interference, sensation, coordination, strength, and balance in preparation for discharge. Pt mod I with mobility including transfers, gait with RW >200 ft, stair navigation x 12 (6 inches) with 2 HR's. Discussed current level of function and recommendation for RW use at home with mobility to decrease fall risk. PT also recommends pt not walk dog at home due to high fall risk. Pt agreeable to high level balance and gait activity and performed ambulation with leg loop connected to green theraband to simulate dog leash as therapist performed various perturbations to simulate dog pulling with walking. Pt requires CGA for task and demonstrated increase fatigue following intervention and required CGA for turning. Pt transitioned to gait with turns with verbal commands CGA. Pt left seated at bedside with all needs in reach and alarm on.     Batu Cassin Darneisha Windhorst PT, DPT  03/31/2023, 12:28 PM

## 2023-03-31 NOTE — Plan of Care (Signed)
  Problem: RH Grooming Goal: LTG Patient will perform grooming w/assist,cues/equip (OT) Description: LTG: Patient will perform grooming with assist, with/without cues using equipment (OT) Outcome: Completed/Met Flowsheets (Taken 03/24/2023 1235) LTG: Pt will perform grooming with assistance level of: Independent with assistive device    Problem: RH Bathing Goal: LTG Patient will bathe all body parts with assist levels (OT) Description: LTG: Patient will bathe all body parts with assist levels (OT) Outcome: Completed/Met Flowsheets (Taken 03/24/2023 1235) LTG: Pt will perform bathing with assistance level/cueing: Independent with assistive device    Problem: RH Dressing Goal: LTG Patient will perform upper body dressing (OT) Description: LTG Patient will perform upper body dressing with assist, with/without cues (OT). Outcome: Completed/Met Flowsheets (Taken 03/24/2023 1235) LTG: Pt will perform upper body dressing with assistance level of: Independent with assistive device Goal: LTG Patient will perform lower body dressing w/assist (OT) Description: LTG: Patient will perform lower body dressing with assist, with/without cues in positioning using equipment (OT) Outcome: Completed/Met Flowsheets (Taken 03/24/2023 1235) LTG: Pt will perform lower body dressing with assistance level of: Independent with assistive device   Problem: RH Toileting Goal: LTG Patient will perform toileting task (3/3 steps) with assistance level (OT) Description: LTG: Patient will perform toileting task (3/3 steps) with assistance level (OT)  Outcome: Completed/Met Flowsheets (Taken 03/24/2023 1235) LTG: Pt will perform toileting task (3/3 steps) with assistance level: Independent with assistive device   Problem: RH Toilet Transfers Goal: LTG Patient will perform toilet transfers w/assist (OT) Description: LTG: Patient will perform toilet transfers with assist, with/without cues using equipment (OT) Outcome:  Completed/Met Flowsheets (Taken 03/24/2023 1235) LTG: Pt will perform toilet transfers with assistance level of: Independent with assistive device   Problem: RH Tub/Shower Transfers Goal: LTG Patient will perform tub/shower transfers w/assist (OT) Description: LTG: Patient will perform tub/shower transfers with assist, with/without cues using equipment (OT) Outcome: Completed/Met Flowsheets (Taken 03/24/2023 1235) LTG: Pt will perform tub/shower stall transfers with assistance level of: Independent with assistive device

## 2023-04-01 DIAGNOSIS — S14109A Unspecified injury at unspecified level of cervical spinal cord, initial encounter: Secondary | ICD-10-CM | POA: Diagnosis not present

## 2023-04-01 LAB — GLUCOSE, CAPILLARY: Glucose-Capillary: 98 mg/dL (ref 70–99)

## 2023-04-01 NOTE — Progress Notes (Signed)
PROGRESS NOTE   Subjective/Complaints:  Pt's PVRs look good.  Pt reports no issues Wife coming at 8-9 am to pick him up.   Went over needs to f/u with me in ! Month to f/u on [place] contusion.  ROS:  I spent a total of    minutes on total care today- >50% coordination of care- due to    Objective:   No results found. Recent Labs    03/31/23 0530  WBC 7.7  HGB 9.7*  HCT 29.8*  PLT 425*    Recent Labs    03/31/23 0530  NA 137  K 4.8  CL 97*  CO2 28  GLUCOSE 132*  BUN 27*  CREATININE 1.48*  CALCIUM 9.3     Intake/Output Summary (Last 24 hours) at 04/01/2023 0757 Last data filed at 03/31/2023 1743 Gross per 24 hour  Intake 720 ml  Output --  Net 720 ml        Physical Exam: Vital Signs Blood pressure 103/61, pulse 75, temperature 97.6 F (36.4 C), resp. rate 17, height 6\' 2"  (1.88 m), weight 75.1 kg, SpO2 96%.       General: awake, alert, appropriate, sitting up in bed; watching TV; NAD HENT: conjugate gaze; oropharynx moist CV: regular rate and rhythm; no JVD Pulmonary: CTA B/L; no W/R/R- good air movement GI: soft, NT, ND, (+)BS Psychiatric: appropriate Neurological: Ox3   RUE- biceps 5-/5; Triceps 4+/5; WE 4+/5; Grip 5-/5 and FA 4+/5 LUE 5-/5 throughout Musculoskeletal:  Moving all 4 extremities in bed to gravity   Skin:    General: Skin is warm and dry.  Neurological:     Alert and oriented x 3. Normal insight and awareness. Intact Memory. Normal language and speech. Cranial nerve exam unremarkable. No ankle clonus noted, no hypertonia noted       Comments: RUE- deltoid 4+/5; Biceps 4+/5; Triceps 4+/5; WE 4+/5; Grip 55 LUE 5/5 in same muscles RLE- 5-/5 throughout LLE- 5/5   Assessment/Plan: 1. Functional deficits which require 3+ hours per day of interdisciplinary therapy in a comprehensive inpatient rehab setting. Physiatrist is providing close team supervision and 24 hour  management of active medical problems listed below. Physiatrist and rehab team continue to assess barriers to discharge/monitor patient progress toward functional and medical goals  Care Tool:  Bathing    Body parts bathed by patient: Right arm, Left lower leg, Face, Left arm, Chest, Abdomen, Front perineal area, Buttocks, Right upper leg, Left upper leg, Right lower leg         Bathing assist Assist Level: Independent with assistive device     Upper Body Dressing/Undressing Upper body dressing   What is the patient wearing?: Pull over shirt    Upper body assist Assist Level: Independent with assistive device    Lower Body Dressing/Undressing Lower body dressing      What is the patient wearing?: Underwear/pull up, Pants     Lower body assist Assist for lower body dressing: Independent with assitive device     Toileting Toileting    Toileting assist Assist for toileting: Independent with assistive device     Transfers Chair/bed transfer  Transfers assist  Chair/bed transfer assist level: Independent with assistive device Chair/bed transfer assistive device: Geologist, engineering   Ambulation assist      Assist level: Independent with assistive device Assistive device: Walker-rolling Max distance: >200 ft   Walk 10 feet activity   Assist     Assist level: Independent with assistive device Assistive device: Walker-rolling   Walk 50 feet activity   Assist Walk 50 feet with 2 turns activity did not occur: Safety/medical concerns (BP)  Assist level: Independent with assistive device Assistive device: Walker-rolling    Walk 150 feet activity   Assist Walk 150 feet activity did not occur: Safety/medical concerns  Assist level: Independent with assistive device Assistive device: Walker-rolling    Walk 10 feet on uneven surface  activity   Assist     Assist level: Independent with assistive device Assistive device:  Walker-rolling   Wheelchair     Assist Is the patient using a wheelchair?: No Type of Wheelchair: Manual    Wheelchair assist level: Supervision/Verbal cueing Max wheelchair distance: 200 ft    Wheelchair 50 feet with 2 turns activity    Assist        Assist Level: Supervision/Verbal cueing   Wheelchair 150 feet activity     Assist      Assist Level: Supervision/Verbal cueing   Blood pressure 103/61, pulse 75, temperature 97.6 F (36.4 C), resp. rate 17, height 6\' 2"  (1.88 m), weight 75.1 kg, SpO2 96%.  Medical Problem List and Plan: 1. 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  Soft cervical collar for comfort.             -patient may  shower             -ELOS/Goals: 8-11 days- supervision  D/c today Will need f/u with me-  2.  Antithrombotics: -DVT/anticoagulation:  Mechanical: Antiembolism stockings, thigh (TED hose) Bilateral lower extremities. Pt is ambulating now 200'+             -antiplatelet therapy: Aspirin 81 mg daily 3. Pain Management/chronic back pain: Neurontin 100 mg nightly, Cymbalta 30 mg daily  1/8- denies pain- con't regimen  1/11-13 denies any pain, continue to monitor 4. Mood/Behavior/Sleep: Provide emotional support             -antipsychotic agents: N/A 5. Neuropsych/cognition: This patient is capable of making decisions on his own behalf. 6. Skin/Wound Care: Routine skin checks 7. Fluids/Electrolytes/Nutrition: Routine in and outs with follow-up chemistries 8.  History of orthostatic hypotension.  ProAmatine 5 mg 3 times daily.  Patient followed by [facility] neurology  1/8- will monitor BP closely since had fall at home, likely due to low BP  1/9- will order Abd binder since had dizziness yesterday- they are doing ACE wraps as well   1/11 patient received abdominal binder, continue to  monitor for dizziness  1/12--no sx. continue lower extremity compression with therapy TED hose and Ace wraps  1/14- BP doing better- still dropping, but using ACE wraps, TEDs and can use abd binder  1/15- increased midodrine to 10 mg TID so can reduce his Abd binder/TEDs and ACE wraps- feeling good this AM  1/16- Doing better- less Sx's.     04/01/2023    4:21 AM 03/31/2023    8:31 PM 03/31/2023    1:42 PM  Vitals with BMI  Systolic 103  120 119  Diastolic 61 73 61  Pulse 75 64 65    9.  Restless leg syndrome.  Requip 4 mg nightly 10.  Hyperlipidemia.  Crestor 11.  Diabetes mellitus with peripheral neuropathy.  Hemoglobin A1c 7.5.  Currently on SSI.  PTA patient on Trulicity 3 mg every Wednesday, Semglee 18 units daily, Tresiba FlexTouch weekly, Glucophage 1000 mg twice daily.  Resume as needed  1/8- CBGs in high 100s to 200's- will restart Metformin 500mg  BID-.  1/9- CBGs looking a little better- will give a few days before increasing back to home dose of 1000 mg BID  1/10- CBGs 102-205- if IT IS APPROPRIATE, SUGGEST INCREASING METFORMIN TO 1000 MG bid TOMORROw  1/11 CBGs doing better however think it is reasonable to increase to have home dose of 1000 mg twice daily  1/12-13 CBGs controlled, continue current regimen  1/14- great control with Metformin at home dose- - will restart [place] and Trulicity at home. - hasn't needed any more Insulin/Scheduled Semglee since restarting Metformin   1/15- BG's looking well- con't regimen  1/16- looking good- go back to home regimen CBG (last 3)  Recent Labs    03/31/23 1717 03/31/23 2101 04/01/23 0602  GLUCAP 124* 76 98     12.  AKI.  Follow-up chemistries  1/8- has resolved  1/16- Cr/ and BUN back up- likely due to low BP- but continue to drink and see PCP for labs in next 2 weeks 13.  CAD with stenting.  Denies CP. Plavix currently on hold and aspirin has been initiated. 14.  Chronic anemia.  Continue iron supplement.  Follow-up  CBC  -HGB stable at 8.2 on 1/8  1/14- will recheck labs in AM 15.  History of tobacco use.  Provide counseling 16. Droplet precautions- has been in hospital since 12/29- on droplet precautions the entire time- no coughing, cold S'xs- will send if can remove  1/8- off droplet precautions 17. Constipation   -1/13- still no bm since 1/8  -sorbitol and SSE today  1/14- 3 BM's yesterday- willl increase Senna to 1 tab BID to try and keep him going.  18. Neurogenic bladder- mild  1/15- was having bladder incontinence per therapy- however not documented like that and PVRs 0- is voiding q2 hours per pt  1/16 doing better    The patient is medically ready for discharge to home and will need follow-up with [facility]. In addition, they will need to follow up with their PCP,  see them in next 2 weeks for labs, etc if possible.  .       LOS: 9 days A FACE TO FACE EVALUATION WAS PERFORMED  Jose Alleyne 04/01/2023, 7:57 AM

## 2023-04-01 NOTE — Progress Notes (Signed)
Inpatient Rehabilitation Care Coordinator Discharge Note   Patient Details  Name: Harry Zamora MRN: [id number] Date of Birth: 1941-03-14   Discharge location: HOME WITH WIFE WHO CAN PROVIDE SUPERVISION LEVEL ONLY  Length of Stay: 9 DAYS  Discharge activity level: SUPERVISION LEVEL  Home/community participation: ACTIVE  Patient response QM:VHQION Literacy - How often do you need to have someone help you when you read instructions, pamphlets, or other written material from your doctor or pharmacy?: Never  Patient response GE:XBMWUX Isolation - How often do you feel lonely or isolated from those around you?: Rarely  Services provided included: MD, RD, PT, OT, RN, CM, Pharmacy, SW  Financial Services:  Field seismologist Utilized: Scientific laboratory technician MEDICARE  Choices offered to/list presented to: PT AND WIFE  Follow-up services arranged:  Home Health, DME, Patient/Family request agency HH/DME Home Health Agency: [facility]  PT  & OT    DME : [facility] HH/DME Requested Agency: REFERRAL MADE WHILE ON ACUTE WILL NED TO PAY CO-PAY TO GET THE ROLLING WALKER DELIVERED TO HIS ROOM PRIOR TO DC HOME  Patient response to transportation need: Is the patient able to respond to transportation needs?: Yes In the past 12 months, has lack of transportation kept you from medical appointments or from getting medications?: No In the past 12 months, has lack of transportation kept you from meetings, work, or from getting things needed for daily living?: No   Patient/Family verbalized understanding of follow-up arrangements:  Yes  Individual responsible for coordination of the follow-up plan: SELF AND WIFE  MARILYN [phone]  Confirmed correct DME delivered: Lucy Chris 04/01/2023    Comments (or additional information):PT REACHED HIS GOALS QUICKLY. WIFE AWARE OF HIS CARE NEEDS.   Summary of Stay    Date/Time Discharge Planning CSW  03/30/23  0846 Home with wife who is able to provide supervison level only. Pt is doing well and making progress in his therapies. Await recommendations of DME and follow up RGD       Kiandria Clum, Lemar Livings

## 2023-04-01 NOTE — Progress Notes (Signed)
..  04/01/2023 10:44 AM .. Vitals:   03/31/23 2031 04/01/23 0421  BP: 120/73 103/61  Pulse: 64 75  Resp: 18 17  Temp: (!) 97.5 F (36.4 C) 97.6 F (36.4 C)  SpO2: 96% 96%    Discharge. Provider reviewed AVS w/patient and significant other. All belongings and items packed. Nurse Tech assisting patient to their vehicle. Pt leaving unit with rolling walker and wheeled off via wheelchair. Family member driving patient home. No further issues to report.

## 2023-04-01 NOTE — Plan of Care (Signed)
  Problem: Education: Goal: Knowledge of General Education information will improve Description: Including pain rating scale, medication(s)/side effects and non-pharmacologic comfort measures Outcome: Progressing   Problem: Clinical Measurements: Goal: Ability to maintain clinical measurements within normal limits will improve Outcome: Progressing Goal: Will remain free from infection Outcome: Progressing Goal: Diagnostic test results will improve Outcome: Progressing Goal: Respiratory complications will improve Outcome: Progressing Goal: Cardiovascular complication will be avoided Outcome: Progressing   Problem: Clinical Measurements: Goal: Will remain free from infection Outcome: Progressing   Problem: Clinical Measurements: Goal: Diagnostic test results will improve Outcome: Progressing   Problem: Clinical Measurements: Goal: Respiratory complications will improve Outcome: Progressing   Problem: Clinical Measurements: Goal: Cardiovascular complication will be avoided Outcome: Progressing   Problem: Nutrition: Goal: Adequate nutrition will be maintained Outcome: Progressing

## 2023-04-03 DIAGNOSIS — E1142 Type 2 diabetes mellitus with diabetic polyneuropathy: Secondary | ICD-10-CM | POA: Diagnosis not present

## 2023-04-03 DIAGNOSIS — E1122 Type 2 diabetes mellitus with diabetic chronic kidney disease: Secondary | ICD-10-CM | POA: Diagnosis not present

## 2023-04-03 DIAGNOSIS — I129 Hypertensive chronic kidney disease with stage 1 through stage 4 chronic kidney disease, or unspecified chronic kidney disease: Secondary | ICD-10-CM | POA: Diagnosis not present

## 2023-04-03 DIAGNOSIS — N1831 Chronic kidney disease, stage 3a: Secondary | ICD-10-CM | POA: Diagnosis not present

## 2023-04-03 DIAGNOSIS — I951 Orthostatic hypotension: Secondary | ICD-10-CM | POA: Diagnosis not present

## 2023-04-03 DIAGNOSIS — I700 Atherosclerosis of aorta: Secondary | ICD-10-CM | POA: Diagnosis not present

## 2023-04-03 DIAGNOSIS — E1151 Type 2 diabetes mellitus with diabetic peripheral angiopathy without gangrene: Secondary | ICD-10-CM | POA: Diagnosis not present

## 2023-04-03 DIAGNOSIS — S0003XD Contusion of scalp, subsequent encounter: Secondary | ICD-10-CM | POA: Diagnosis not present

## 2023-04-03 DIAGNOSIS — I25110 Atherosclerotic heart disease of native coronary artery with unstable angina pectoris: Secondary | ICD-10-CM | POA: Diagnosis not present

## 2023-04-05 ENCOUNTER — Telehealth: Payer: Self-pay

## 2023-04-05 NOTE — Telephone Encounter (Signed)
Transition Care Management Unsuccessful Follow-up Telephone Call  Date of discharge and from where:  04/11/2023 [facility]  Attempts:  1st Attempt  Reason for unsuccessful TCM follow-up call:  Left voice message

## 2023-04-07 DIAGNOSIS — I25110 Atherosclerotic heart disease of native coronary artery with unstable angina pectoris: Secondary | ICD-10-CM | POA: Diagnosis not present

## 2023-04-07 DIAGNOSIS — Z794 Long term (current) use of insulin: Secondary | ICD-10-CM | POA: Diagnosis not present

## 2023-04-07 DIAGNOSIS — I10 Essential (primary) hypertension: Secondary | ICD-10-CM | POA: Diagnosis not present

## 2023-04-07 DIAGNOSIS — N1831 Chronic kidney disease, stage 3a: Secondary | ICD-10-CM | POA: Diagnosis not present

## 2023-04-07 DIAGNOSIS — K760 Fatty (change of) liver, not elsewhere classified: Secondary | ICD-10-CM | POA: Diagnosis not present

## 2023-04-07 DIAGNOSIS — E1140 Type 2 diabetes mellitus with diabetic neuropathy, unspecified: Secondary | ICD-10-CM | POA: Diagnosis not present

## 2023-04-08 ENCOUNTER — Ambulatory Visit: Payer: Medicare PPO | Attending: Cardiology | Admitting: Cardiology

## 2023-04-08 ENCOUNTER — Encounter: Payer: Self-pay | Admitting: Cardiology

## 2023-04-08 VITALS — BP 157/66 | HR 70 | Resp 16 | Ht 74.0 in | Wt 163.8 lb

## 2023-04-08 DIAGNOSIS — R42 Dizziness and giddiness: Secondary | ICD-10-CM

## 2023-04-08 DIAGNOSIS — I2510 Atherosclerotic heart disease of native coronary artery without angina pectoris: Secondary | ICD-10-CM

## 2023-04-08 DIAGNOSIS — R55 Syncope and collapse: Secondary | ICD-10-CM

## 2023-04-08 DIAGNOSIS — D649 Anemia, unspecified: Secondary | ICD-10-CM | POA: Diagnosis not present

## 2023-04-08 DIAGNOSIS — N179 Acute kidney failure, unspecified: Secondary | ICD-10-CM | POA: Diagnosis not present

## 2023-04-08 DIAGNOSIS — R2990 Unspecified symptoms and signs involving the nervous system: Secondary | ICD-10-CM | POA: Diagnosis not present

## 2023-04-08 DIAGNOSIS — I719 Aortic aneurysm of unspecified site, without rupture: Secondary | ICD-10-CM

## 2023-04-08 DIAGNOSIS — E1140 Type 2 diabetes mellitus with diabetic neuropathy, unspecified: Secondary | ICD-10-CM | POA: Diagnosis not present

## 2023-04-08 DIAGNOSIS — I739 Peripheral vascular disease, unspecified: Secondary | ICD-10-CM

## 2023-04-08 DIAGNOSIS — N1831 Chronic kidney disease, stage 3a: Secondary | ICD-10-CM | POA: Diagnosis not present

## 2023-04-08 DIAGNOSIS — I951 Orthostatic hypotension: Secondary | ICD-10-CM | POA: Diagnosis not present

## 2023-04-08 DIAGNOSIS — Z794 Long term (current) use of insulin: Secondary | ICD-10-CM | POA: Diagnosis not present

## 2023-04-08 DIAGNOSIS — S14109D Unspecified injury at unspecified level of cervical spinal cord, subsequent encounter: Secondary | ICD-10-CM | POA: Diagnosis not present

## 2023-04-08 DIAGNOSIS — I129 Hypertensive chronic kidney disease with stage 1 through stage 4 chronic kidney disease, or unspecified chronic kidney disease: Secondary | ICD-10-CM | POA: Diagnosis not present

## 2023-04-08 NOTE — Progress Notes (Signed)
Cardiology Office Note:  .   Date:  04/08/2023  ID:  Harry Zamora, DOB 1940-08-15, MRN [id number] PCP: Adrian Prince, MD  [facility] Providers Cardiologist:  Truett Mainland, MD PCP: Adrian Prince, MD  Chief Complaint  Patient presents with   Coronary artery disease involving native coronary artery of   Follow-up      History of Present Illness: .    Harry Zamora is a 83 y.o. male with uncontrolled type 2 diabetes mellitus, CAD, PAD, syncope, spinal stenosis s/p prior surgeries   Patient was hospitalized in late 02/25/2023 after fall and sustaining small laceration on back of his head, without loss of consciousness.  Workup showed left occipital scalp hematoma on CT head.  He was treated with TNK for strokelike symptoms.  However, MRI brain did not show any acute stroke.  He did have profound orthostatic hypotension for which he was started on midodrine.  There was also noted a cervical spine cord contusion at C3-C5 level.  Patient underwent physical therapy and rehab and was discharged last week.  Remarkably, patient has recovered well from these recent events and walked in using a walker today for his appointment.  He has had some right-sided weakness since recent event, but is improving.  He has been taking midodrine regularly for orthostatic hypotension.  Blood pressure is in fact elevated today.  He is now on aspirin 81 mg daily instead of Plavix.  He has had 1 fall since his hospital discharge and had bruise on his scalp.  He did not lose consciousness, did not develop any neurological symptoms.    Vitals:   04/08/23 0911  BP: (!) 157/66  Pulse: 70  Resp: 16  SpO2: 96%     ROS:  Review of Systems  Cardiovascular:  Negative for chest pain, dyspnea on exertion, leg swelling, palpitations and syncope.  Musculoskeletal:  Positive for falls.     Studies Reviewed: Marland Kitchen         Independently interpreted 03/2023: Chol 61, TG 47, HDL 33, LDL 19 Hb 9.7 Cr  4.09 (1.14 on 1.8.2025) Albumin 3.0    Physical Exam:   Physical Exam Vitals and nursing note reviewed.  Constitutional:      General: He is not in acute distress. Neck:     Vascular: No JVD.  Cardiovascular:     Rate and Rhythm: Normal rate and regular rhythm.     Heart sounds: Normal heart sounds. No murmur heard. Pulmonary:     Effort: Pulmonary effort is normal.     Breath sounds: Normal breath sounds. No wheezing or rales.  Musculoskeletal:     Right lower leg: No edema.     Left lower leg: No edema.      VISIT DIAGNOSES:   ICD-10-CM   1. Postural dizziness with presyncope  R42    R55     2. Coronary artery disease involving native coronary artery of native heart without angina pectoris  I25.10     3. 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 PAD (peripheral artery disease) (HCC)  I73.9     5. Orthostatic hypotension  I95.1        ASSESSMENT AND PLAN: .    Harry Zamora is a 83 y.o. male with uncontrolled type 2 diabetes mellitus, CAD, PAD, recurrent syncope, spinal stenosis s/p prior surgeries, interstitial lung disease   Dizziness, frequent falls: Profound orthostatic hypotension, resting blood pressure now increased since initiation of midodrine  5 mg 3 times daily. Given his recurrent falls due to orthostatic hypotension, heart prefer rather higher blood pressure than low blood pressure in his case. Continue follow-up with neurology given recent cervical spine contusion.   CAD:  No angina symptoms.   S/p LAD/ramus T stenting (10/01/2020) Given recurrent falls and risk of bleeding, agree with aspirin 81 mg daily in place of Plavix monotherapy.   Lipids well-controlled.    PAD: B/l ABI 0.73 (08/2020) No critical limb ischemia or lifestyle limiting claudication Continue plavix, statin.   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 Laneta Simmers.      F/u in 6 months  Signed, Elder Negus, MD

## 2023-04-08 NOTE — Patient Instructions (Signed)
 Medication Instructions:   Your physician recommends that you continue on your current medications as directed. Please refer to the Current Medication list given to you today.  *If you need a refill on your cardiac medications before your next appointment, please call your pharmacy*    Follow-Up: At [facility], you and your health needs are our priority.  As part of our continuing mission to provide you with exceptional heart care, we have created designated Provider Care Teams.  These Care Teams include your primary Cardiologist (physician) and Advanced Practice Providers (APPs -  Physician Assistants and Nurse Practitioners) who all work together to provide you with the care you need, when you need it.  We recommend signing up for the patient portal called "MyChart".  Sign up information is provided on this After Visit Summary.  MyChart is used to connect with patients for Virtual Visits (Telemedicine).  Patients are able to view lab/test results, encounter notes, upcoming appointments, etc.  Non-urgent messages can be sent to your provider as well.   To learn more about what you can do with MyChart, go to [email or web address].    Your next appointment:   6 month(s)  Provider:   DR. Rosemary Holms

## 2023-04-09 DIAGNOSIS — E1151 Type 2 diabetes mellitus with diabetic peripheral angiopathy without gangrene: Secondary | ICD-10-CM | POA: Diagnosis not present

## 2023-04-09 DIAGNOSIS — N1831 Chronic kidney disease, stage 3a: Secondary | ICD-10-CM | POA: Diagnosis not present

## 2023-04-09 DIAGNOSIS — I25110 Atherosclerotic heart disease of native coronary artery with unstable angina pectoris: Secondary | ICD-10-CM | POA: Diagnosis not present

## 2023-04-09 DIAGNOSIS — I700 Atherosclerosis of aorta: Secondary | ICD-10-CM | POA: Diagnosis not present

## 2023-04-09 DIAGNOSIS — I951 Orthostatic hypotension: Secondary | ICD-10-CM | POA: Diagnosis not present

## 2023-04-09 DIAGNOSIS — I129 Hypertensive chronic kidney disease with stage 1 through stage 4 chronic kidney disease, or unspecified chronic kidney disease: Secondary | ICD-10-CM | POA: Diagnosis not present

## 2023-04-09 DIAGNOSIS — E1142 Type 2 diabetes mellitus with diabetic polyneuropathy: Secondary | ICD-10-CM | POA: Diagnosis not present

## 2023-04-09 DIAGNOSIS — E1122 Type 2 diabetes mellitus with diabetic chronic kidney disease: Secondary | ICD-10-CM | POA: Diagnosis not present

## 2023-04-09 DIAGNOSIS — S0003XD Contusion of scalp, subsequent encounter: Secondary | ICD-10-CM | POA: Diagnosis not present

## 2023-04-12 DIAGNOSIS — I25110 Atherosclerotic heart disease of native coronary artery with unstable angina pectoris: Secondary | ICD-10-CM | POA: Diagnosis not present

## 2023-04-12 DIAGNOSIS — N1831 Chronic kidney disease, stage 3a: Secondary | ICD-10-CM | POA: Diagnosis not present

## 2023-04-12 DIAGNOSIS — I951 Orthostatic hypotension: Secondary | ICD-10-CM | POA: Diagnosis not present

## 2023-04-12 DIAGNOSIS — E1122 Type 2 diabetes mellitus with diabetic chronic kidney disease: Secondary | ICD-10-CM | POA: Diagnosis not present

## 2023-04-12 DIAGNOSIS — S0003XD Contusion of scalp, subsequent encounter: Secondary | ICD-10-CM | POA: Diagnosis not present

## 2023-04-12 DIAGNOSIS — E1142 Type 2 diabetes mellitus with diabetic polyneuropathy: Secondary | ICD-10-CM | POA: Diagnosis not present

## 2023-04-12 DIAGNOSIS — I129 Hypertensive chronic kidney disease with stage 1 through stage 4 chronic kidney disease, or unspecified chronic kidney disease: Secondary | ICD-10-CM | POA: Diagnosis not present

## 2023-04-12 DIAGNOSIS — E1151 Type 2 diabetes mellitus with diabetic peripheral angiopathy without gangrene: Secondary | ICD-10-CM | POA: Diagnosis not present

## 2023-04-12 DIAGNOSIS — I700 Atherosclerosis of aorta: Secondary | ICD-10-CM | POA: Diagnosis not present

## 2023-04-13 ENCOUNTER — Encounter: Payer: Medicare PPO | Attending: Registered Nurse | Admitting: Registered Nurse

## 2023-04-13 ENCOUNTER — Encounter: Payer: Self-pay | Admitting: Registered Nurse

## 2023-04-13 VITALS — BP 83/55 | HR 98 | Ht 74.0 in | Wt 160.0 lb

## 2023-04-13 DIAGNOSIS — S14109A Unspecified injury at unspecified level of cervical spinal cord, initial encounter: Secondary | ICD-10-CM | POA: Insufficient documentation

## 2023-04-13 DIAGNOSIS — I951 Orthostatic hypotension: Secondary | ICD-10-CM | POA: Diagnosis not present

## 2023-04-13 NOTE — Progress Notes (Unsigned)
Subjective:    Patient ID: Harry Zamora, male    DOB: Mar 25, 1940, 83 y.o.   MRN: [id number]  HPI: Harry Zamora is a 83 y.o. male who is scheduled for HFU appointment for F/U of his  Contusion of Cervical Cord and Orthostatic Hypotension. He presented to [facility] ED via EMS post fall at home.  Dr Wilford Corner H&P Note:  Harry Zamora is a 83 y.o. male  has a past medical history of Anemia, Arthritis, Back pain, Depression, Disc displacement, lumbar, Gynecomastia, History of kidney stones, Hyperlipidemia, Hypertension, Hypertriglyceridemia, Insomnia, Low back pain, Lumbar radiculopathy, Lumbar stenosis, OA (osteoarthritis), Polyneuropathy in diabetes(357.2), Prostate cancer (HCC) (2003), Restless legs, Ringing in ears, RLS (restless legs syndrome), and Type 2 diabetes mellitus (HCC).  Last known well around 11 PM when he went to the bathroom and on returning from the bathroom had a fall hit the back of his head had a small laceration in the back of the head and EMS was called with this.  They noted him to be weak on the right and activated a code stroke as he was within the window for treatment.  Attempted to reach his wife at least 6 times over the phone-went straight to voicemail.  Called the other number as well, same outcome-went to voicemail.  Called the daughter who lives out of town, did not have any history about his last known well. Patient reports that he fell right before EMS was called and a few minutes before that was completely normal so going bilateral level history, last known well is around 11 as the fall happened around 11:15 PM. Risk benefits alternatives of TNK discussed with the patient 1   Recently been sick with influenza for the past few days.   Patient is being followed by [facility] neurology for orthostatic hypotension and frequent falls but there has been no mention of any focal weakness in the history.  Also, no right-sided weakness or pupillary asymmetry was listed in the last  exam of 01/15/2023.  MoCA score 20 during that same evaluation.   CT Head: WO Contrast: IMPRESSION: 1. No acute intracranial process. ASPECTS is 10. 2. Left occipital scalp hematoma.  CT: Cervical Spine: IMPRESSION: No acute fracture or traumatic listhesis in the cervical spine.  CTA: Narrative & Impression  CLINICAL DATA:  Right arm weakness, right pupil blown, head trauma   EXAM: CT ANGIOGRAPHY HEAD AND NECK WITH AND WITHOUT CONTRAST   TECHNIQUE: Multidetector CT imaging of the head and neck was performed using the standard protocol during bolus administration of intravenous contrast. Multiplanar CT image reconstructions and MIPs were obtained to evaluate the vascular anatomy. Carotid stenosis measurements (when applicable) are obtained utilizing NASCET criteria, using the distal internal carotid diameter as the denominator.   RADIATION DOSE REDUCTION: This exam was performed according to the departmental dose-optimization program which includes automated exposure control, adjustment of the mA and/or kV according to patient size and/or use of iterative reconstruction technique.   CONTRAST:  75mL OMNIPAQUE IOHEXOL 350 MG/ML SOLN   COMPARISON:  09/13/2022 CTA head and neck, correlation is made with 03/14/2023 CT head   FINDINGS: CT HEAD FINDINGS   For noncontrast findings, please see same day CT head.   CTA NECK FINDINGS   Aortic arch: Standard branching. Imaged portion shows no evidence of aneurysm or dissection. No significant stenosis of the major arch vessel origins. Aortic atherosclerosis   Right carotid system: No evidence of dissection, occlusion, or hemodynamically significant stenosis (greater  than 50%).   Left carotid system: No evidence of dissection, occlusion, or hemodynamically significant stenosis (greater than 50%).   Vertebral arteries: Moderate stenosis at the origin of the left vertebral artery. Evaluation of the proximal left vertebral  artery is limited by beam hardening artifact from the adjacent contrast bolus. The left vertebral artery is otherwise patent to the skull base. Mild stenosis at the origin of the right vertebral artery. The right vertebral artery is otherwise patent to the skull base. No evidence of dissection.   Skeleton: No acute osseous abnormality. Degenerative changes in the cervical spine.   Other neck: No acute finding.   Upper chest: No focal pulmonary opacity or pleural effusion.   Review of the MIP images confirms the above findings   CTA HEAD FINDINGS   Anterior circulation: Both internal carotid arteries are patent to the termini, with mild stenosis in the bilateral cavernous segments and moderate to severe stenosis in the proximal supraclinoid segments bilaterally.   A1 segments patent. Normal anterior communicating artery. Anterior cerebral arteries are patent to their distal aspects without significant stenosis.   No M1 stenosis or occlusion. MCA branches are irregular but perfused to their distal aspects without significant stenosis.   Posterior circulation: Vertebral arteries patent to the vertebrobasilar junction, with mild stenosis in the proximal right V4. The left vertebral artery primarily supplies the left PICA, and is quite diminutive after the PICA takeoff. Posterior inferior cerebellar arteries patent proximally.   Basilar patent to its distal aspect without significant stenosis. Superior cerebellar arteries patent proximally.   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 Redemonstrated severe stenosis in the proximal left P2 (series 7, images 98-99), with multifocal irregularity in the remainder of the left P2. Evaluation of the more distal PCAs is limited by venous contamination. The bilateral posterior communicating arteries are not  definitively seen.   Venous sinuses: As permitted by contrast timing, patent.   Anatomic variants: None significant.   No evidence of aneurysm or vascular malformation.   Review of the MIP images confirms the above findings   IMPRESSION: 1. No intracranial large vessel occlusion. 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 2. 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 3. 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 4. Moderate stenosis at the origin of the left vertebral artery and mild stenosis at the origin of the right vertebral artery. 5. Aortic atherosclerosis.   Aortic Atherosclerosis (ICD10-I70.0).  Neurology Following: Recommended: Low Dose Aspirin and Plavix remained on Hold.   Mr. Antionette Char was admitted to inpatient rehabilitation on 03/23/2023 and discharged home on 04/01/2023. He is receiving Home Health Therapy by [facility]. He denies any pain, he rated his pain on Health and History 5.   He reports he has right hand tingling and burning.   Mr. Barreiro arrived to office with hypotension, Orthostatic blood pressures were taken, he denies dizziness and he refused ED or Urgent Care evaluation. He has a history of Orthostatic hypotension and he stated he will F/U with his PCP.    Pain Inventory Average Pain 6 Pain Right Now 5 My pain is intermittent and aching  In the last 24 hours, has pain interfered with the following?  General activity 5 Relation with others 0 Enjoyment of life 0 What TIME of day is your pain at its worst? night Sleep (in general) Good  Pain is worse with: standing Pain improves with: therapy/exercise Relief from Meds: 6  walk with assistance use a cane use a walker ability to climb steps?  yes do you drive?  yes Do  you have any goals in this area?  yes  retired  trouble walking  Any changes since last visit?  no  Any changes since last visit?  no    Family History  Problem Relation Age of Onset   Arthritis Mother    Hypertension Mother    Diabetes Mother    Heart attack Mother    Cancer Father    Social History   Socioeconomic History   Marital status: Married    Spouse name: Jola Babinski   Number of children: 3   Years of education: 12   Highest education level: Not on file  Occupational History   Occupation: Chief Strategy Officer: [employer]  Tobacco Use   Smoking status: Some Days    Types: Cigars   Smokeless tobacco: Never   Tobacco comments:    occasional  Vaping Use   Vaping status: Never Used  Substance and Sexual Activity   Alcohol use: Not Currently    Comment: social-beer   Drug use: No   Sexual activity: Not on file  Other Topics Concern   Not on file  Social History Narrative   Patient is married Jola Babinski) and lives at home with his wife.   Patient has three children and two- step children.   Patient is working full-time.   Patient has a high school education.   Patient is right-handed.   Patient drinks two cups of coffee, 2-3 sodas and one cup of tea daily.   Social Drivers of Corporate investment banker Strain: Not on file  Food Insecurity: No Food Insecurity (03/15/2023)   Hunger Vital Sign    Worried About Running Out of Food in the Last Year: Never true    Ran Out of Food in the Last Year: Never true  Transportation Needs: No Transportation Needs (03/15/2023)   PRAPARE - Administrator, Civil Service (Medical): No    Lack of Transportation (Non-Medical): No  Physical Activity: Not on file  Stress: Not on file  Social Connections: Socially Integrated (03/15/2023)   Social Connection and Isolation Panel [NHANES]    Frequency of Communication with Friends and Family: More than three times a week    Frequency of Social Gatherings with  Friends and Family: More than three times a week    Attends Religious Services: More than 4 times per year    Active Member of Clubs or Organizations: Yes    Attends Banker Meetings: More than 4 times per year    Marital Status: Married   Past Surgical History:  Procedure Laterality Date   ABDOMINAL EXPOSURE N/A 07/13/2019   Procedure: ABDOMINAL EXPOSURE;  Surgeon: Nada Libman, MD;  Location: [facility] OR;  Service: Vascular;  Laterality: N/A;  anterior approach   ANTERIOR LAT LUMBAR FUSION Right 07/13/2019   Procedure: Right Lumbar 3-4 Lumbar 4-5 Anterolateral lumbar interbody fusion;  Surgeon: Maeola Harman, MD;  Location: [facility] OR;  Service: Neurosurgery;  Laterality: Right;   ANTERIOR LUMBAR FUSION N/A 07/13/2019   Procedure: Lumbar Five-Sacral One Anterior lumbar interbody fusion;  Surgeon: Maeola Harman, MD;  Location: [facility] OR;  Service: Neurosurgery;  Laterality: N/A;  Anterior approach   APPLICATION OF INTRAOPERATIVE CT SCAN N/A 04/24/2021   Procedure: APPLICATION OF INTRAOPERATIVE CT SCAN;  Surgeon: Dawley, Alan Mulder, DO;  Location: [facility] OR;  Service: Neurosurgery;  Laterality: N/A;  3C/RM 21   CHOLECYSTECTOMY     COLONOSCOPY     CORONARY STENT INTERVENTION N/A 10/01/2020   Procedure: CORONARY STENT INTERVENTION;  Surgeon: Elder Negus, MD;  Location: [facility] INVASIVE CV LAB;  Service: Cardiovascular;  Laterality: N/A;   EYE SURGERY Bilateral    GALLBLADDER SURGERY     HEMORRHOID SURGERY  1983   LEFT HEART CATH AND CORONARY ANGIOGRAPHY N/A 09/17/2020   Procedure: LEFT HEART CATH AND CORONARY ANGIOGRAPHY;  Surgeon: Elder Negus, MD;  Location: [facility] INVASIVE CV LAB;  Service: Cardiovascular;  Laterality: N/A;   LUMBAR LAMINECTOMY/DECOMPRESSION MICRODISCECTOMY N/A 10/02/2016   Procedure: L3 to S1 Laminectomy;  Surgeon: Maeola Harman, MD;  Location: [facility] OR;  Service: Neurosurgery;  Laterality: N/A;  L3 to S1 Laminectomy   LUMBAR LAMINECTOMY/DECOMPRESSION MICRODISCECTOMY Bilateral  09/07/2019   Procedure: Bilateral Lumbar Five - Sacral One Foraminotomy;  Surgeon: Maeola Harman, MD;  Location: [facility] OR;  Service: Neurosurgery;  Laterality: Bilateral;  posterior   LUMBAR PERCUTANEOUS PEDICLE SCREW 3 LEVEL N/A 07/13/2019   Procedure: Percutaneous pedicle screw fixation from Lumbar 3 to Sacral 1;  Surgeon: Maeola Harman, MD;  Location: [facility] OR;  Service: Neurosurgery;  Laterality: N/A;   MENISCUS REPAIR Right    PROSTATE SURGERY     SHOULDER SURGERY Left 01/2011   rotator cuff   Past Medical History:  Diagnosis Date   Anemia    as an infant   Arthritis    Back pain    Depression    Disc displacement, lumbar    Gynecomastia    History of kidney stones    Hyperlipidemia    Hypertension    no longer on medications   Hypertriglyceridemia    Insomnia    Low back pain    Lumbar radiculopathy    Lumbar stenosis    OA (osteoarthritis)    Polyneuropathy in diabetes(357.2)    Prostate cancer (HCC) 2003   Restless legs    Ringing in ears    RLS (restless legs syndrome)    Type 2 diabetes mellitus (HCC)    Wt 160 lb (72.6 kg)   BMI 20.54 kg/m   Opioid Risk Score:   Fall Risk Score:  `1  Depression screen [facility] 2/9     04/13/2023    9:48 AM  Depression screen PHQ 2/9  Decreased Interest 1  Down, Depressed, Hopeless 0  PHQ - 2 Score 1  Altered sleeping 0  Tired, decreased energy 0  Change in appetite 0  Feeling bad or failure about yourself  0  Trouble concentrating 0  Moving slowly or fidgety/restless 0  Suicidal thoughts 0  PHQ-9 Score 1    Review of Systems  Eyes:  Negative for photophobia.  Musculoskeletal:  Positive for gait problem.  All other systems reviewed and are negative.      Objective:   Physical Exam Vitals and nursing note reviewed.  Constitutional:      Appearance: Normal appearance.  Cardiovascular:     Rate and Rhythm: Normal rate and regular rhythm.     Pulses: Normal pulses.     Heart sounds: Normal heart sounds.  Pulmonary:      Effort: Pulmonary effort is normal.     Breath sounds: Normal breath sounds.  Musculoskeletal:     Comments: Normal Muscle Bulk and Muscle Testing Reveals:  Upper Extremities: Right: Decreased ROM 90 Degrees  and Muscle Strength 4/5 Left Upper Extremity: Full ROM and Muscle Strength 5/5 Lower Extremities: Full ROM and Muscle Strength 5/5 Arises from Table slowly using walker for support Narrow Based  Gait     Skin:    General: Skin is warm and dry.  Neurological:     Mental Status: He is alert and oriented to person, place, and time.  Psychiatric:        Mood and Affect: Mood normal.        Behavior: Behavior normal.          Assessment & Plan:  Contusion of Cervical Cord: He has a scheduled appointment with Neurology. Continue Home Health Therapy with [facility]. Continue to Monitor.  Orthostatic Hypotension: Mr. Hoshino denies any dizziness. Orthostatic blood pressure were checked. He refuses ED or Urgent Care Evaluation. Neurology Following. Continue to Monitor.   F/U with Dr Berline Chough in 4-6 weeks.

## 2023-04-13 NOTE — Patient Instructions (Addendum)
Call Dr. Evlyn Kanner To Schedule an Appointment with Dr Evlyn Kanner.   Call Dr Rosemary Holms to schedule a hospital follow up appointment.

## 2023-04-14 DIAGNOSIS — I25110 Atherosclerotic heart disease of native coronary artery with unstable angina pectoris: Secondary | ICD-10-CM | POA: Diagnosis not present

## 2023-04-14 DIAGNOSIS — N1831 Chronic kidney disease, stage 3a: Secondary | ICD-10-CM | POA: Diagnosis not present

## 2023-04-14 DIAGNOSIS — E1122 Type 2 diabetes mellitus with diabetic chronic kidney disease: Secondary | ICD-10-CM | POA: Diagnosis not present

## 2023-04-14 DIAGNOSIS — I951 Orthostatic hypotension: Secondary | ICD-10-CM | POA: Diagnosis not present

## 2023-04-14 DIAGNOSIS — E1142 Type 2 diabetes mellitus with diabetic polyneuropathy: Secondary | ICD-10-CM | POA: Diagnosis not present

## 2023-04-14 DIAGNOSIS — I700 Atherosclerosis of aorta: Secondary | ICD-10-CM | POA: Diagnosis not present

## 2023-04-14 DIAGNOSIS — S0003XD Contusion of scalp, subsequent encounter: Secondary | ICD-10-CM | POA: Diagnosis not present

## 2023-04-14 DIAGNOSIS — E1151 Type 2 diabetes mellitus with diabetic peripheral angiopathy without gangrene: Secondary | ICD-10-CM | POA: Diagnosis not present

## 2023-04-14 DIAGNOSIS — I129 Hypertensive chronic kidney disease with stage 1 through stage 4 chronic kidney disease, or unspecified chronic kidney disease: Secondary | ICD-10-CM | POA: Diagnosis not present

## 2023-04-21 DIAGNOSIS — I700 Atherosclerosis of aorta: Secondary | ICD-10-CM | POA: Diagnosis not present

## 2023-04-21 DIAGNOSIS — I951 Orthostatic hypotension: Secondary | ICD-10-CM | POA: Diagnosis not present

## 2023-04-21 DIAGNOSIS — E1151 Type 2 diabetes mellitus with diabetic peripheral angiopathy without gangrene: Secondary | ICD-10-CM | POA: Diagnosis not present

## 2023-04-21 DIAGNOSIS — I25110 Atherosclerotic heart disease of native coronary artery with unstable angina pectoris: Secondary | ICD-10-CM | POA: Diagnosis not present

## 2023-04-21 DIAGNOSIS — E1122 Type 2 diabetes mellitus with diabetic chronic kidney disease: Secondary | ICD-10-CM | POA: Diagnosis not present

## 2023-04-21 DIAGNOSIS — I129 Hypertensive chronic kidney disease with stage 1 through stage 4 chronic kidney disease, or unspecified chronic kidney disease: Secondary | ICD-10-CM | POA: Diagnosis not present

## 2023-04-21 DIAGNOSIS — E1142 Type 2 diabetes mellitus with diabetic polyneuropathy: Secondary | ICD-10-CM | POA: Diagnosis not present

## 2023-04-21 DIAGNOSIS — S0003XD Contusion of scalp, subsequent encounter: Secondary | ICD-10-CM | POA: Diagnosis not present

## 2023-04-21 DIAGNOSIS — N1831 Chronic kidney disease, stage 3a: Secondary | ICD-10-CM | POA: Diagnosis not present

## 2023-04-22 DIAGNOSIS — E1122 Type 2 diabetes mellitus with diabetic chronic kidney disease: Secondary | ICD-10-CM | POA: Diagnosis not present

## 2023-04-22 DIAGNOSIS — I25110 Atherosclerotic heart disease of native coronary artery with unstable angina pectoris: Secondary | ICD-10-CM | POA: Diagnosis not present

## 2023-04-22 DIAGNOSIS — E1142 Type 2 diabetes mellitus with diabetic polyneuropathy: Secondary | ICD-10-CM | POA: Diagnosis not present

## 2023-04-22 DIAGNOSIS — E1151 Type 2 diabetes mellitus with diabetic peripheral angiopathy without gangrene: Secondary | ICD-10-CM | POA: Diagnosis not present

## 2023-04-22 DIAGNOSIS — S0003XD Contusion of scalp, subsequent encounter: Secondary | ICD-10-CM | POA: Diagnosis not present

## 2023-04-22 DIAGNOSIS — I700 Atherosclerosis of aorta: Secondary | ICD-10-CM | POA: Diagnosis not present

## 2023-04-22 DIAGNOSIS — I951 Orthostatic hypotension: Secondary | ICD-10-CM | POA: Diagnosis not present

## 2023-04-22 DIAGNOSIS — I129 Hypertensive chronic kidney disease with stage 1 through stage 4 chronic kidney disease, or unspecified chronic kidney disease: Secondary | ICD-10-CM | POA: Diagnosis not present

## 2023-04-22 DIAGNOSIS — N1831 Chronic kidney disease, stage 3a: Secondary | ICD-10-CM | POA: Diagnosis not present

## 2023-04-23 ENCOUNTER — Other Ambulatory Visit: Payer: Self-pay | Admitting: Surgery

## 2023-04-23 DIAGNOSIS — E1151 Type 2 diabetes mellitus with diabetic peripheral angiopathy without gangrene: Secondary | ICD-10-CM | POA: Diagnosis not present

## 2023-04-23 DIAGNOSIS — R918 Other nonspecific abnormal finding of lung field: Secondary | ICD-10-CM

## 2023-04-23 DIAGNOSIS — I25110 Atherosclerotic heart disease of native coronary artery with unstable angina pectoris: Secondary | ICD-10-CM | POA: Diagnosis not present

## 2023-04-23 DIAGNOSIS — S0003XD Contusion of scalp, subsequent encounter: Secondary | ICD-10-CM | POA: Diagnosis not present

## 2023-04-23 DIAGNOSIS — E1142 Type 2 diabetes mellitus with diabetic polyneuropathy: Secondary | ICD-10-CM | POA: Diagnosis not present

## 2023-04-23 DIAGNOSIS — I700 Atherosclerosis of aorta: Secondary | ICD-10-CM | POA: Diagnosis not present

## 2023-04-23 DIAGNOSIS — E1122 Type 2 diabetes mellitus with diabetic chronic kidney disease: Secondary | ICD-10-CM | POA: Diagnosis not present

## 2023-04-23 DIAGNOSIS — I951 Orthostatic hypotension: Secondary | ICD-10-CM | POA: Diagnosis not present

## 2023-04-23 DIAGNOSIS — N1831 Chronic kidney disease, stage 3a: Secondary | ICD-10-CM | POA: Diagnosis not present

## 2023-04-23 DIAGNOSIS — I129 Hypertensive chronic kidney disease with stage 1 through stage 4 chronic kidney disease, or unspecified chronic kidney disease: Secondary | ICD-10-CM | POA: Diagnosis not present

## 2023-04-26 DIAGNOSIS — I951 Orthostatic hypotension: Secondary | ICD-10-CM | POA: Diagnosis not present

## 2023-04-26 DIAGNOSIS — N1831 Chronic kidney disease, stage 3a: Secondary | ICD-10-CM | POA: Diagnosis not present

## 2023-04-26 DIAGNOSIS — E1151 Type 2 diabetes mellitus with diabetic peripheral angiopathy without gangrene: Secondary | ICD-10-CM | POA: Diagnosis not present

## 2023-04-26 DIAGNOSIS — E1122 Type 2 diabetes mellitus with diabetic chronic kidney disease: Secondary | ICD-10-CM | POA: Diagnosis not present

## 2023-04-26 DIAGNOSIS — I129 Hypertensive chronic kidney disease with stage 1 through stage 4 chronic kidney disease, or unspecified chronic kidney disease: Secondary | ICD-10-CM | POA: Diagnosis not present

## 2023-04-26 DIAGNOSIS — E1142 Type 2 diabetes mellitus with diabetic polyneuropathy: Secondary | ICD-10-CM | POA: Diagnosis not present

## 2023-04-26 DIAGNOSIS — I25110 Atherosclerotic heart disease of native coronary artery with unstable angina pectoris: Secondary | ICD-10-CM | POA: Diagnosis not present

## 2023-04-26 DIAGNOSIS — S0003XD Contusion of scalp, subsequent encounter: Secondary | ICD-10-CM | POA: Diagnosis not present

## 2023-04-26 DIAGNOSIS — I700 Atherosclerosis of aorta: Secondary | ICD-10-CM | POA: Diagnosis not present

## 2023-04-27 DIAGNOSIS — I25110 Atherosclerotic heart disease of native coronary artery with unstable angina pectoris: Secondary | ICD-10-CM | POA: Diagnosis not present

## 2023-04-27 DIAGNOSIS — E1142 Type 2 diabetes mellitus with diabetic polyneuropathy: Secondary | ICD-10-CM | POA: Diagnosis not present

## 2023-04-27 DIAGNOSIS — E1151 Type 2 diabetes mellitus with diabetic peripheral angiopathy without gangrene: Secondary | ICD-10-CM | POA: Diagnosis not present

## 2023-04-27 DIAGNOSIS — N1831 Chronic kidney disease, stage 3a: Secondary | ICD-10-CM | POA: Diagnosis not present

## 2023-04-27 DIAGNOSIS — E1122 Type 2 diabetes mellitus with diabetic chronic kidney disease: Secondary | ICD-10-CM | POA: Diagnosis not present

## 2023-04-27 DIAGNOSIS — S0003XD Contusion of scalp, subsequent encounter: Secondary | ICD-10-CM | POA: Diagnosis not present

## 2023-04-27 DIAGNOSIS — I129 Hypertensive chronic kidney disease with stage 1 through stage 4 chronic kidney disease, or unspecified chronic kidney disease: Secondary | ICD-10-CM | POA: Diagnosis not present

## 2023-04-27 DIAGNOSIS — I700 Atherosclerosis of aorta: Secondary | ICD-10-CM | POA: Diagnosis not present

## 2023-04-27 DIAGNOSIS — I951 Orthostatic hypotension: Secondary | ICD-10-CM | POA: Diagnosis not present

## 2023-04-28 DIAGNOSIS — M4802 Spinal stenosis, cervical region: Secondary | ICD-10-CM | POA: Diagnosis not present

## 2023-04-28 DIAGNOSIS — M542 Cervicalgia: Secondary | ICD-10-CM | POA: Diagnosis not present

## 2023-05-03 DIAGNOSIS — I25110 Atherosclerotic heart disease of native coronary artery with unstable angina pectoris: Secondary | ICD-10-CM | POA: Diagnosis not present

## 2023-05-03 DIAGNOSIS — E1122 Type 2 diabetes mellitus with diabetic chronic kidney disease: Secondary | ICD-10-CM | POA: Diagnosis not present

## 2023-05-03 DIAGNOSIS — N1831 Chronic kidney disease, stage 3a: Secondary | ICD-10-CM | POA: Diagnosis not present

## 2023-05-03 DIAGNOSIS — S0003XD Contusion of scalp, subsequent encounter: Secondary | ICD-10-CM | POA: Diagnosis not present

## 2023-05-03 DIAGNOSIS — I129 Hypertensive chronic kidney disease with stage 1 through stage 4 chronic kidney disease, or unspecified chronic kidney disease: Secondary | ICD-10-CM | POA: Diagnosis not present

## 2023-05-03 DIAGNOSIS — I951 Orthostatic hypotension: Secondary | ICD-10-CM | POA: Diagnosis not present

## 2023-05-03 DIAGNOSIS — E1151 Type 2 diabetes mellitus with diabetic peripheral angiopathy without gangrene: Secondary | ICD-10-CM | POA: Diagnosis not present

## 2023-05-03 DIAGNOSIS — E1142 Type 2 diabetes mellitus with diabetic polyneuropathy: Secondary | ICD-10-CM | POA: Diagnosis not present

## 2023-05-03 DIAGNOSIS — I700 Atherosclerosis of aorta: Secondary | ICD-10-CM | POA: Diagnosis not present

## 2023-05-04 ENCOUNTER — Encounter: Payer: Self-pay | Admitting: Adult Health

## 2023-05-04 ENCOUNTER — Ambulatory Visit: Payer: Medicare PPO | Admitting: Adult Health

## 2023-05-04 VITALS — BP 107/63 | HR 89 | Ht 74.0 in | Wt 163.0 lb

## 2023-05-04 DIAGNOSIS — S14109D Unspecified injury at unspecified level of cervical spinal cord, subsequent encounter: Secondary | ICD-10-CM

## 2023-05-04 DIAGNOSIS — Z7984 Long term (current) use of oral hypoglycemic drugs: Secondary | ICD-10-CM

## 2023-05-04 DIAGNOSIS — E1122 Type 2 diabetes mellitus with diabetic chronic kidney disease: Secondary | ICD-10-CM

## 2023-05-04 DIAGNOSIS — N1831 Chronic kidney disease, stage 3a: Secondary | ICD-10-CM

## 2023-05-04 DIAGNOSIS — R2990 Unspecified symptoms and signs involving the nervous system: Secondary | ICD-10-CM

## 2023-05-04 NOTE — Patient Instructions (Addendum)
Your Plan:  Continue aspirin 81 mg daily Blood pressure goal <130/90 Cholesterol LDL goal <70 Diabetes goal A1c <7 Monitor diet and try to exercise  Call [facility] to schedule follow-up appointment [phone]   Thank you for coming to see [place] at [facility]. I hope we have been able to provide you high quality care today.  You may receive a patient satisfaction survey over the next few weeks. We would appreciate your feedback and comments so that we may continue to improve ourselves and the health of our patients.

## 2023-05-04 NOTE — Progress Notes (Unsigned)
PATIENT: Harry Zamora [facility] DOB: 1941/02/20  REASON FOR VISIT: follow up HISTORY FROM: patient PRIMARY NEUROLOGIST: Dr. Terrace Arabia   Chief Complaint  Patient presents with   Hospitalization Follow-up    Rm 19, wife.  Home for a month, getting PT/OT at home.  Doing so much better.  Taking asa only.      HISTORY OF PRESENT ILLNESS: Today 05/04/23  Harry Zamora is a 83 y.o. male follow-up here for strokelike symptoms status post TNK and cervical spine contusion due to fall r/t orthostatic hypotension.  He reports that he still has weakness in the right hand.  Reports that it is hard to hold objects.  He is currently in physical therapy and Occupational Therapy at home.  He states that he continues to get dizzy if he stands too fast.  He remains on aspirin 81 mg daily.  Remains on Crestor 20 mg daily.  In the hospital a referral was placed to neurosurgery for cervical spine contusion.  They report they have not been called for an appointment.  He was told in the hospital that he no longer has to wear the collar.     CTA: 1. No intracranial large vessel occlusion. 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 2. 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 3. 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 4. Moderate stenosis at the origin of the left vertebral artery and mild stenosis at the origin of the right vertebral artery. 5. Aortic atherosclerosis.   MRI brain wo contrast: IMPRESSION: 1. No acute intracranial abnormality. 2. Findings of chronic small vessel ischemia. 3. Posterior scalp subgaleal hematoma.  MRI cervical spine wo contrast: IMPRESSION: 1. Abnormal cord signal at C3-4 and C4-5 compatible with edema/contusion. No evidence of cord hemorrhage. 2. Suspected  small volume epidural hematoma in the upper cervical spine with possible PLL disruption at C2-3. 3. 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  HISTORY 83 y.o. patient with history of Anemia, Arthritis, Back pain, Depression, Disc displacement, lumbar, Gynecomastia, History of kidney stones, Hyperlipidemia, Hypertension, Hypertriglyceridemia, Insomnia, Low back pain, Lumbar radiculopathy, Lumbar stenosis, OA (osteoarthritis), Polyneuropathy in diabetes(357.2), Prostate cancer (HCC) (2003), Restless legs, Ringing in ears, RLS (restless legs syndrome), and Type 2 diabetes mellitus (HCC).  Last known well around 11 PM when he went to the bathroom and on returning from the bathroom had a fall hit the back of his head had a small laceration in the back of the head and EMS was called with this. Patient reports that he fell right before EMS was called and a few minutes before that was completely normal so going bilateral level history, last known well is around 11 as the fall happened around 11:15 PM. Risk benefits alternatives of TNK discussed with the patient. Patient is being followed by [facility] neurology for orthostatic hypotension and frequent falls but there has been no mention of any focal weakness in the history.  Also, no right-sided weakness or pupillary asymmetry was listed in the last exam of 01/15/2023.  MoCA score 20 during that same evaluation.   HOSPITAL COURSE Stroke like symptoms s/p TNK Cervical spinal cord contusion s/p fall due to orthostatic hypotension  CT no acute finding CTA head and neck b/l  P1/P2 severe stenosis. 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 Left VA origin moderate stenosis MRI no acute infarct MRI C-spine cervical spinal cord edema/contusion at C3-5, concerning for contusion 2D Echo: LVEF 55 to 60% LDL 19 HgbA1c: 7.5 SCDs for VTE prophylaxis clopidogrel 75  mg daily prior to admission, now on ASA 81 with stable Hb. NSG consulted and recommend soft C-collar, also needs outpt follow up Ongoing aggressive stroke risk factor management Therapy recommendations:  CIR Disposition:  pending     REVIEW OF SYSTEMS: Out of a complete 14 system review of symptoms, the patient complains only of the following symptoms, and all other reviewed systems are negative.  ALLERGIES: Allergies  Allergen Reactions   Atorvastatin Other (See Comments)   Niacin     Other Reaction(s): break out in hives   Levaquin [Levofloxacin In D5w] Rash   Niaspan [Niacin Er (Antihyperlipidemic)] Rash and Other (See Comments)    Flushing reaction   Tramadol Rash    HOME MEDICATIONS: Outpatient Medications Prior to Visit  Medication Sig Dispense Refill   acetaminophen (TYLENOL) 325 MG tablet Take 2 tablets (650 mg total) by mouth every 4 (four) hours as needed for mild pain (pain score 1-3) (or temp > 37.5 C (99.5 F)).     ascorbic acid (VITAMIN C) 500 MG tablet Take 1 tablet (500 mg total) by mouth daily. 30 tablet 0   aspirin EC 81 MG tablet Take 1 tablet (81 mg total) by mouth daily. Swallow whole.     Calcium Carb-Cholecalciferol (CALCIUM 1000 + D PO) Take by mouth.     cyanocobalamin 1000 MCG tablet Take 1 tablet (1,000 mcg total) by mouth daily. 30 tablet 0   Dulaglutide (TRULICITY) 3 MG/0.5ML SOAJ Inject 3 mg into the skin once a week. (Patient taking differently: Inject 3 mg into the skin once a week. Inject on Wednesday) 12 mL 3   DULoxetine (CYMBALTA) 30 MG capsule Take 1 capsule (30 mg total) by mouth daily. 30 capsule 0   ferrous sulfate 325 (65 FE) MG tablet Take 1 tablet (325 mg total) by mouth daily with breakfast. 30 tablet 0   gabapentin (NEURONTIN) 100 MG capsule Take 1 capsule (100 mg total) by mouth at bedtime. 30 capsule 0   insulin glargine-yfgn (SEMGLEE) 100 UNIT/ML injection Inject 18 Units into the skin daily.     metFORMIN (GLUCOPHAGE) 1000 MG tablet  Take 1 tablet (1,000 mg total) by mouth 2 (two) times daily with a meal. 60 tablet 0   midodrine (PROAMATINE) 5 MG tablet Take 1 tablet (5 mg total) by mouth 3 (three) times daily with meals. 90 tablet 0   Multiple Vitamins-Minerals (MENS 50+ MULTI VITAMIN/MIN) TABS Take 1 tablet by mouth daily.     nitroGLYCERIN (NITROSTAT) 0.4 MG SL tablet Place 1 tablet (0.4 mg total) under the tongue every 5 (five) minutes as needed for chest pain. 30 tablet 3   pantoprazole (PROTONIX) 40 MG tablet Take 1 tablet (40 mg total) by mouth daily. 30 tablet 0   rOPINIRole (REQUIP) 2 MG tablet Take 2 tablets (4 mg total) by mouth at bedtime. 60 tablet 0   rosuvastatin (CRESTOR) 20 MG tablet Take 1 tablet (20 mg total) by mouth daily. 30 tablet 0   TRESIBA FLEXTOUCH 100 UNIT/ML FlexTouch Pen daily. Inject on Wednesday     No facility-administered medications prior to visit.    PAST MEDICAL HISTORY: Past Medical History:  Diagnosis Date   Anemia    as an infant  Arthritis    Back pain    Depression    Disc displacement, lumbar    Gynecomastia    History of kidney stones    Hyperlipidemia    Hypertension    no longer on medications   Hypertriglyceridemia    Insomnia    Low back pain    Lumbar radiculopathy    Lumbar stenosis    OA (osteoarthritis)    Polyneuropathy in diabetes(357.2)    Prostate cancer (HCC) 2003   Restless legs    Ringing in ears    RLS (restless legs syndrome)    Type 2 diabetes mellitus (HCC)     PAST SURGICAL HISTORY: Past Surgical History:  Procedure Laterality Date   ABDOMINAL EXPOSURE N/A 07/13/2019   Procedure: ABDOMINAL EXPOSURE;  Surgeon: Nada Libman, MD;  Location: [facility] OR;  Service: Vascular;  Laterality: N/A;  anterior approach   ANTERIOR LAT LUMBAR FUSION Right 07/13/2019   Procedure: Right Lumbar 3-4 Lumbar 4-5 Anterolateral lumbar interbody fusion;  Surgeon: Maeola Harman, MD;  Location: [facility] OR;  Service: Neurosurgery;  Laterality: Right;   ANTERIOR LUMBAR  FUSION N/A 07/13/2019   Procedure: Lumbar Five-Sacral One Anterior lumbar interbody fusion;  Surgeon: Maeola Harman, MD;  Location: [facility] OR;  Service: Neurosurgery;  Laterality: N/A;  Anterior approach   APPLICATION OF INTRAOPERATIVE CT SCAN N/A 04/24/2021   Procedure: APPLICATION OF INTRAOPERATIVE CT SCAN;  Surgeon: Dawley, Alan Mulder, DO;  Location: [facility] OR;  Service: Neurosurgery;  Laterality: N/A;  3C/RM 21   CHOLECYSTECTOMY     COLONOSCOPY     CORONARY STENT INTERVENTION N/A 10/01/2020   Procedure: CORONARY STENT INTERVENTION;  Surgeon: Elder Negus, MD;  Location: [facility] INVASIVE CV LAB;  Service: Cardiovascular;  Laterality: N/A;   EYE SURGERY Bilateral    GALLBLADDER SURGERY     HEMORRHOID SURGERY  1983   LEFT HEART CATH AND CORONARY ANGIOGRAPHY N/A 09/17/2020   Procedure: LEFT HEART CATH AND CORONARY ANGIOGRAPHY;  Surgeon: Elder Negus, MD;  Location: [facility] INVASIVE CV LAB;  Service: Cardiovascular;  Laterality: N/A;   LUMBAR LAMINECTOMY/DECOMPRESSION MICRODISCECTOMY N/A 10/02/2016   Procedure: L3 to S1 Laminectomy;  Surgeon: Maeola Harman, MD;  Location: [facility] OR;  Service: Neurosurgery;  Laterality: N/A;  L3 to S1 Laminectomy   LUMBAR LAMINECTOMY/DECOMPRESSION MICRODISCECTOMY Bilateral 09/07/2019   Procedure: Bilateral Lumbar Five - Sacral One Foraminotomy;  Surgeon: Maeola Harman, MD;  Location: [facility] OR;  Service: Neurosurgery;  Laterality: Bilateral;  posterior   LUMBAR PERCUTANEOUS PEDICLE SCREW 3 LEVEL N/A 07/13/2019   Procedure: Percutaneous pedicle screw fixation from Lumbar 3 to Sacral 1;  Surgeon: Maeola Harman, MD;  Location: [facility] OR;  Service: Neurosurgery;  Laterality: N/A;   MENISCUS REPAIR Right    PROSTATE SURGERY     SHOULDER SURGERY Left 01/2011   rotator cuff    FAMILY HISTORY: Family History  Problem Relation Age of Onset   Arthritis Mother    Hypertension Mother    Diabetes Mother    Heart attack Mother    Cancer Father     SOCIAL HISTORY: Social History    Socioeconomic History   Marital status: Married    Spouse name: Jola Babinski   Number of children: 3   Years of education: 12   Highest education level: Not on file  Occupational History   Occupation: Chief Strategy Officer: [employer]  Tobacco Use   Smoking status: Some Days    Types: Cigars   Smokeless tobacco: Never   Tobacco  comments:    occasional  Vaping Use   Vaping status: Never Used  Substance and Sexual Activity   Alcohol use: Not Currently    Comment: social-beer   Drug use: No   Sexual activity: Not on file  Other Topics Concern   Not on file  Social History Narrative   Patient is married Jola Babinski) and lives at home with his wife.   Patient has three children and two- step children.   Patient is working full-time.   Patient has a high school education.   Patient is right-handed.   Patient drinks two cups of coffee, 2-3 sodas and one cup of tea daily.   Social Drivers of Corporate investment banker Strain: Not on file  Food Insecurity: No Food Insecurity (03/15/2023)   Hunger Vital Sign    Worried About Running Out of Food in the Last Year: Never true    Ran Out of Food in the Last Year: Never true  Transportation Needs: No Transportation Needs (03/15/2023)   PRAPARE - Administrator, Civil Service (Medical): No    Lack of Transportation (Non-Medical): No  Physical Activity: Not on file  Stress: Not on file  Social Connections: Socially Integrated (03/15/2023)   Social Connection and Isolation Panel [NHANES]    Frequency of Communication with Friends and Family: More than three times a week    Frequency of Social Gatherings with Friends and Family: More than three times a week    Attends Religious Services: More than 4 times per year    Active Member of [employer] or Organizations: Yes    Attends Banker Meetings: More than 4 times per year    Marital Status: Married  Catering manager Violence: Not At Risk (03/15/2023)   Humiliation,  Afraid, Rape, and Kick questionnaire    Fear of Current or Ex-Partner: No    Emotionally Abused: No    Physically Abused: No    Sexually Abused: No      PHYSICAL EXAM  Vitals:   05/04/23 0903  BP: 107/63  Pulse: 89  SpO2: 99%  Weight: 163 lb (73.9 kg)  Height: 6\' 2"  (1.88 m)   Body mass index is 20.93 kg/m.  Generalized: Well developed, in no acute distress   Neurological examination  Mentation: Alert oriented to time, place, history taking. Follows all commands speech and language fluent Cranial nerve II-XII: Pupils were equal round reactive to light. Extraocular movements were full, visual field were full on confrontational test. Facial sensation and strength were normal. Uvula tongue midline. Head turning and shoulder shrug  were normal and symmetric. Motor: The motor testing reveals 5 over 5 strength of all 4 extremities with exception of being slightly weaker in the right upper extremity particularly in the hand. Good symmetric motor tone is noted throughout.  Sensory: Sensory testing is intact to soft touch on all 4 extremities. No evidence of extinction is noted.  Coordination: Cerebellar testing reveals good finger-nose-finger and heel-to-shin bilaterally.  Gait and station: Uses a cane when ambulating.  Tandem gait not attempted. Reflexes: Deep tendon reflexes are symmetric and normal bilaterally.   DIAGNOSTIC DATA (LABS, IMAGING, TESTING) - I reviewed patient records, labs, notes, testing and imaging myself where available.  Lab Results  Component Value Date   WBC 7.7 03/31/2023   HGB 9.7 (L) 03/31/2023   HCT 29.8 (L) 03/31/2023   MCV 95.5 03/31/2023   PLT 425 (H) 03/31/2023      Component Value Date/Time  NA 137 03/31/2023 0530   NA 141 10/03/2020 1138   K 4.8 03/31/2023 0530   CL 97 (L) 03/31/2023 0530   CO2 28 03/31/2023 0530   GLUCOSE 132 (H) 03/31/2023 0530   BUN 27 (H) 03/31/2023 0530   BUN 27 10/03/2020 1138   CREATININE 1.48 (H) 03/31/2023 0530    CALCIUM 9.3 03/31/2023 0530   PROT 6.9 03/31/2023 0530   PROT 7.3 11/18/2022 1554   ALBUMIN 3.0 (L) 03/31/2023 0530   AST 18 03/31/2023 0530   ALT 13 03/31/2023 0530   ALKPHOS 70 03/31/2023 0530   BILITOT 1.3 (H) 03/31/2023 0530   GFRNONAA 47 (L) 03/31/2023 0530   GFRAA 56 (L) 09/05/2019 1144   Lab Results  Component Value Date   CHOL 61 03/14/2023   HDL 33 (L) 03/14/2023   LDLCALC 19 03/14/2023   TRIG 47 03/14/2023   CHOLHDL 1.8 03/14/2023   Lab Results  Component Value Date   HGBA1C 7.5 (H) 03/15/2023   Lab Results  Component Value Date   VITAMINB12 1,249 (H) 11/18/2022   Lab Results  Component Value Date   TSH 1.290 11/18/2022      ASSESSMENT AND PLAN 83 y.o. year old male  has a past medical history of Anemia, Arthritis, Back pain, Depression, Disc displacement, lumbar, Gynecomastia, History of kidney stones, Hyperlipidemia, Hypertension, Hypertriglyceridemia, Insomnia, Low back pain, Lumbar radiculopathy, Lumbar stenosis, OA (osteoarthritis), Polyneuropathy in diabetes(357.2), Prostate cancer (HCC) (2003), Restless legs, Ringing in ears, RLS (restless legs syndrome), and Type 2 diabetes mellitus (HCC). here with:  Stroke like symptoms s/p TNK Cervical spinal cord contusion s/p fall due to orthostatic hypotension     Continue aspirin 81 mg daily  for secondary stroke prevention.  Discussed secondary stroke prevention measures and importance of close PCP follow up for aggressive stroke risk factor management. I have gone over the pathophysiology of stroke, warning signs and symptoms, risk factors and their management in some detail with instructions to go to the closest emergency room for symptoms of concern. HTN: BP goal <130/90.   HLD: LDL goal <70. Recent LDL 19.  DMII: A1c goal<7.0. Recent A1c 7.5-managed by endocrinology.  Encouraged patient to monitor diet and encouraged exercise Provided the patient with the number to [facility].  They are to call  and schedule a follow-up appointment for cervical spinal cord contusion. FU with our office 6 to 8 months or sooner if needed     Butch Penny, MSN, NP-C 05/04/2023, 8:40 AM [facility] [address] [place], [place] [address] [phone]  Addendum: Concerned about his MRI findings, cord signal abnormality congenitally narrow cervical cord, with moderate spinal stenosis from C3-4 through C5-6, also suspected small volume epidural hematoma at upper cervical with possible PLL disruption at C2-3, status post TNK  Plan to follow-up [facility] for his cervical contusion  1. Abnormal cord signal at C3-4 and C4-5 compatible with edema/contusion. No evidence of cord hemorrhage. 2. Suspected small volume epidural hematoma in the upper cervical spine with possible PLL disruption at C2-3. 3. 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

## 2023-05-05 DIAGNOSIS — I700 Atherosclerosis of aorta: Secondary | ICD-10-CM | POA: Diagnosis not present

## 2023-05-05 DIAGNOSIS — E1142 Type 2 diabetes mellitus with diabetic polyneuropathy: Secondary | ICD-10-CM | POA: Diagnosis not present

## 2023-05-05 DIAGNOSIS — N1831 Chronic kidney disease, stage 3a: Secondary | ICD-10-CM | POA: Diagnosis not present

## 2023-05-05 DIAGNOSIS — I951 Orthostatic hypotension: Secondary | ICD-10-CM | POA: Diagnosis not present

## 2023-05-05 DIAGNOSIS — S0003XD Contusion of scalp, subsequent encounter: Secondary | ICD-10-CM | POA: Diagnosis not present

## 2023-05-05 DIAGNOSIS — E1122 Type 2 diabetes mellitus with diabetic chronic kidney disease: Secondary | ICD-10-CM | POA: Diagnosis not present

## 2023-05-05 DIAGNOSIS — I129 Hypertensive chronic kidney disease with stage 1 through stage 4 chronic kidney disease, or unspecified chronic kidney disease: Secondary | ICD-10-CM | POA: Diagnosis not present

## 2023-05-05 DIAGNOSIS — I25110 Atherosclerotic heart disease of native coronary artery with unstable angina pectoris: Secondary | ICD-10-CM | POA: Diagnosis not present

## 2023-05-05 DIAGNOSIS — E1151 Type 2 diabetes mellitus with diabetic peripheral angiopathy without gangrene: Secondary | ICD-10-CM | POA: Diagnosis not present

## 2023-05-05 NOTE — Progress Notes (Signed)
 I agree with the above plan

## 2023-05-06 DIAGNOSIS — I25110 Atherosclerotic heart disease of native coronary artery with unstable angina pectoris: Secondary | ICD-10-CM | POA: Diagnosis not present

## 2023-05-06 DIAGNOSIS — N1831 Chronic kidney disease, stage 3a: Secondary | ICD-10-CM | POA: Diagnosis not present

## 2023-05-06 DIAGNOSIS — I951 Orthostatic hypotension: Secondary | ICD-10-CM | POA: Diagnosis not present

## 2023-05-06 DIAGNOSIS — S0003XD Contusion of scalp, subsequent encounter: Secondary | ICD-10-CM | POA: Diagnosis not present

## 2023-05-06 DIAGNOSIS — I700 Atherosclerosis of aorta: Secondary | ICD-10-CM | POA: Diagnosis not present

## 2023-05-06 DIAGNOSIS — I129 Hypertensive chronic kidney disease with stage 1 through stage 4 chronic kidney disease, or unspecified chronic kidney disease: Secondary | ICD-10-CM | POA: Diagnosis not present

## 2023-05-06 DIAGNOSIS — E1142 Type 2 diabetes mellitus with diabetic polyneuropathy: Secondary | ICD-10-CM | POA: Diagnosis not present

## 2023-05-06 DIAGNOSIS — E1151 Type 2 diabetes mellitus with diabetic peripheral angiopathy without gangrene: Secondary | ICD-10-CM | POA: Diagnosis not present

## 2023-05-06 DIAGNOSIS — E1122 Type 2 diabetes mellitus with diabetic chronic kidney disease: Secondary | ICD-10-CM | POA: Diagnosis not present

## 2023-05-10 ENCOUNTER — Telehealth: Payer: Self-pay | Admitting: *Deleted

## 2023-05-10 DIAGNOSIS — I129 Hypertensive chronic kidney disease with stage 1 through stage 4 chronic kidney disease, or unspecified chronic kidney disease: Secondary | ICD-10-CM | POA: Diagnosis not present

## 2023-05-10 DIAGNOSIS — E1142 Type 2 diabetes mellitus with diabetic polyneuropathy: Secondary | ICD-10-CM | POA: Diagnosis not present

## 2023-05-10 DIAGNOSIS — I25110 Atherosclerotic heart disease of native coronary artery with unstable angina pectoris: Secondary | ICD-10-CM | POA: Diagnosis not present

## 2023-05-10 DIAGNOSIS — N1831 Chronic kidney disease, stage 3a: Secondary | ICD-10-CM | POA: Diagnosis not present

## 2023-05-10 DIAGNOSIS — I951 Orthostatic hypotension: Secondary | ICD-10-CM | POA: Diagnosis not present

## 2023-05-10 DIAGNOSIS — I700 Atherosclerosis of aorta: Secondary | ICD-10-CM | POA: Diagnosis not present

## 2023-05-10 DIAGNOSIS — E1151 Type 2 diabetes mellitus with diabetic peripheral angiopathy without gangrene: Secondary | ICD-10-CM | POA: Diagnosis not present

## 2023-05-10 DIAGNOSIS — E1122 Type 2 diabetes mellitus with diabetic chronic kidney disease: Secondary | ICD-10-CM | POA: Diagnosis not present

## 2023-05-10 DIAGNOSIS — S0003XD Contusion of scalp, subsequent encounter: Secondary | ICD-10-CM | POA: Diagnosis not present

## 2023-05-10 NOTE — Telephone Encounter (Signed)
-----   Message from [facility] sent at 05/10/2023  8:31 AM EST ----- Please call patient and make sure he got an appointment with [facility] ----- Message ----- From: Levert Feinstein, MD Sent: 05/07/2023   6:59 PM EST To: Butch Penny, NP  Cord look tight, make sure he is followed by with [facility] for that     Addendum: Concerned about his MRI findings, cord signal abnormality congenitally narrow cervical cord, with moderate spinal stenosis from C3-4 through C5-6, also suspected small volume epidural hematoma at upper cervical with possible PLL disruption at C2-3, status post TNK  Plan to follow-up [facility] for his cervical contusion  1. Abnormal cord signal at C3-4 and C4-5 compatible with edema/contusion. No evidence of cord hemorrhage. 2. Suspected small volume epidural hematoma in the upper cervical spine with possible PLL disruption at C2-3. 3. 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 ----- Message ----- From: Butch Penny, NP Sent: 05/05/2023   7:46 AM EST To: Levert Feinstein, MD  New stroke- you saw for falls/orthostatic hypotension. Let me know if there is anything else you recommend in regards to stroke work up

## 2023-05-10 NOTE — Telephone Encounter (Signed)
 noted

## 2023-05-10 NOTE — Telephone Encounter (Addendum)
 I called and spoke to wife of pt.  She had called and spoken to [facility] office, and she said they had seen  Dr Jake Samples 04-28-2023,  and nothing to be done at this time but will follow up in 2 months.  (I had LMVM for [facility] to return call about date of follow up appt).

## 2023-05-11 ENCOUNTER — Other Ambulatory Visit (HOSPITAL_COMMUNITY): Payer: Self-pay

## 2023-05-13 DIAGNOSIS — I25110 Atherosclerotic heart disease of native coronary artery with unstable angina pectoris: Secondary | ICD-10-CM | POA: Diagnosis not present

## 2023-05-13 DIAGNOSIS — E1151 Type 2 diabetes mellitus with diabetic peripheral angiopathy without gangrene: Secondary | ICD-10-CM | POA: Diagnosis not present

## 2023-05-13 DIAGNOSIS — S0003XD Contusion of scalp, subsequent encounter: Secondary | ICD-10-CM | POA: Diagnosis not present

## 2023-05-13 DIAGNOSIS — N1831 Chronic kidney disease, stage 3a: Secondary | ICD-10-CM | POA: Diagnosis not present

## 2023-05-13 DIAGNOSIS — I129 Hypertensive chronic kidney disease with stage 1 through stage 4 chronic kidney disease, or unspecified chronic kidney disease: Secondary | ICD-10-CM | POA: Diagnosis not present

## 2023-05-13 DIAGNOSIS — E1122 Type 2 diabetes mellitus with diabetic chronic kidney disease: Secondary | ICD-10-CM | POA: Diagnosis not present

## 2023-05-13 DIAGNOSIS — E1142 Type 2 diabetes mellitus with diabetic polyneuropathy: Secondary | ICD-10-CM | POA: Diagnosis not present

## 2023-05-13 DIAGNOSIS — I951 Orthostatic hypotension: Secondary | ICD-10-CM | POA: Diagnosis not present

## 2023-05-13 DIAGNOSIS — I700 Atherosclerosis of aorta: Secondary | ICD-10-CM | POA: Diagnosis not present

## 2023-05-14 DIAGNOSIS — I951 Orthostatic hypotension: Secondary | ICD-10-CM | POA: Diagnosis not present

## 2023-05-14 DIAGNOSIS — J849 Interstitial pulmonary disease, unspecified: Secondary | ICD-10-CM | POA: Diagnosis not present

## 2023-05-14 DIAGNOSIS — E1122 Type 2 diabetes mellitus with diabetic chronic kidney disease: Secondary | ICD-10-CM | POA: Diagnosis not present

## 2023-05-14 DIAGNOSIS — I129 Hypertensive chronic kidney disease with stage 1 through stage 4 chronic kidney disease, or unspecified chronic kidney disease: Secondary | ICD-10-CM | POA: Diagnosis not present

## 2023-05-14 DIAGNOSIS — F331 Major depressive disorder, recurrent, moderate: Secondary | ICD-10-CM | POA: Diagnosis not present

## 2023-05-14 DIAGNOSIS — I25110 Atherosclerotic heart disease of native coronary artery with unstable angina pectoris: Secondary | ICD-10-CM | POA: Diagnosis not present

## 2023-05-14 DIAGNOSIS — N1831 Chronic kidney disease, stage 3a: Secondary | ICD-10-CM | POA: Diagnosis not present

## 2023-05-14 DIAGNOSIS — D631 Anemia in chronic kidney disease: Secondary | ICD-10-CM | POA: Diagnosis not present

## 2023-05-23 ENCOUNTER — Other Ambulatory Visit (HOSPITAL_BASED_OUTPATIENT_CLINIC_OR_DEPARTMENT_OTHER): Payer: Self-pay

## 2023-05-26 ENCOUNTER — Other Ambulatory Visit (HOSPITAL_BASED_OUTPATIENT_CLINIC_OR_DEPARTMENT_OTHER): Payer: Self-pay

## 2023-05-26 ENCOUNTER — Other Ambulatory Visit: Payer: Self-pay

## 2023-05-26 MED ORDER — MIDODRINE HCL 5 MG PO TABS
5.0000 mg | ORAL_TABLET | Freq: Three times a day (TID) | ORAL | 2 refills | Status: DC
  Filled 2023-05-26: qty 90, 30d supply, fill #0
  Filled 2023-07-07: qty 90, 30d supply, fill #1
  Filled 2023-09-21: qty 90, 30d supply, fill #2

## 2023-05-27 ENCOUNTER — Ambulatory Visit
Admission: RE | Admit: 2023-05-27 | Discharge: 2023-05-27 | Disposition: A | Discharge status: Home / Self Care | Payer: Medicare PPO | Source: Ambulatory Visit | Attending: Surgery | Admitting: Surgery

## 2023-05-27 DIAGNOSIS — J984 Other disorders of lung: Secondary | ICD-10-CM | POA: Diagnosis not present

## 2023-05-27 DIAGNOSIS — R918 Other nonspecific abnormal finding of lung field: Secondary | ICD-10-CM

## 2023-05-27 DIAGNOSIS — R911 Solitary pulmonary nodule: Secondary | ICD-10-CM | POA: Diagnosis not present

## 2023-06-02 ENCOUNTER — Ambulatory Visit: Payer: Medicare PPO | Admitting: Surgery

## 2023-06-04 ENCOUNTER — Encounter: Payer: Self-pay | Admitting: Surgery

## 2023-06-04 ENCOUNTER — Ambulatory Visit: Admitting: Surgery

## 2023-06-04 VITALS — BP 118/65 | HR 103 | Resp 20 | Ht 74.0 in | Wt 154.4 lb

## 2023-06-04 DIAGNOSIS — I719 Aortic aneurysm of unspecified site, without rupture: Secondary | ICD-10-CM

## 2023-06-04 NOTE — Progress Notes (Signed)
 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  When I last saw him on 12/02/2022 this ulcer was unchanged.  There is also a cluster of small groundglass opacities in the anterior left upper lobe that was felt to most likely be focal inflammation related to some recent left lung pneumonia.  A follow-up CT of the chest in 3 to 6 months as recommended by radiology.  He continues to feel well overall without shortness of breath.  He is here today with his wife.  Current Outpatient Medications  Medication Sig Dispense Refill   acetaminophen (TYLENOL) 325 MG tablet Take 2 tablets (650 mg total) by mouth every 4 (four) hours as needed for mild pain (pain score 1-3) (or temp > 37.5 C (99.5 F)).     ascorbic acid (VITAMIN C) 500 MG tablet Take 1 tablet (500 mg total) by mouth daily. 30 tablet 0   aspirin EC 81 MG tablet Take 1 tablet (81 mg total) by mouth daily. Swallow whole.     Calcium Carb-Cholecalciferol (CALCIUM 1000 + D PO) Take by mouth.     cyanocobalamin 1000 MCG tablet Take 1 tablet (1,000 mcg total) by mouth daily. 30 tablet 0   Dulaglutide (TRULICITY) 3 MG/0.5ML SOAJ Inject 3 mg into the skin once a week. (Patient taking differently: Inject 3 mg into the skin once a week. Inject on Wednesday) 12 mL 3   DULoxetine (CYMBALTA) 30 MG capsule Take 1 capsule (30 mg total) by mouth daily. 30 capsule 0   ferrous sulfate 325 (65 FE) MG tablet Take 1 tablet (325 mg total) by mouth daily with breakfast. 30 tablet 0   gabapentin (NEURONTIN) 100 MG capsule Take 1 capsule (100 mg total) by mouth at bedtime. 30 capsule 0   insulin glargine-yfgn (SEMGLEE) 100 UNIT/ML injection Inject 18 Units into the skin daily.     metFORMIN (GLUCOPHAGE) 1000 MG tablet Take 1 tablet (1,000 mg total) by mouth 2 (two) times daily with a meal. 60 tablet 0   midodrine (PROAMATINE) 5 MG tablet Take 1 tablet (5 mg total) by mouth 3 (three) times daily  with meals. Will refill until I see him in clinic- but if he doesn't come to visit, cannot refill past 3 months 90 tablet 2   Multiple Vitamins-Minerals (MENS 50+ MULTI VITAMIN/MIN) TABS Take 1 tablet by mouth daily.     pantoprazole (PROTONIX) 40 MG tablet Take 1 tablet (40 mg total) by mouth daily. 30 tablet 0   rOPINIRole (REQUIP) 2 MG tablet Take 2 tablets (4 mg total) by mouth at bedtime. 60 tablet 0   rosuvastatin (CRESTOR) 20 MG tablet Take 1 tablet (20 mg total) by mouth daily. 30 tablet 0   TRESIBA FLEXTOUCH 100 UNIT/ML FlexTouch Pen daily. Inject on Wednesday     nitroGLYCERIN (NITROSTAT) 0.4 MG SL tablet Place 1 tablet (0.4 mg total) under the tongue every 5 (five) minutes as needed for chest pain. 30 tablet 3   No current facility-administered medications for this visit.     Physical Exam: BP 118/65 (BP Location: Right Arm, Patient Position: Sitting, Cuff Size: Small)   Pulse (!) 103   Resp 20   Ht 6\' 2"  (1.88 m)   Wt 154 lb 6.4 oz (70 kg)   SpO2 99%   BMI 19.82 kg/m  He looks well. Cardiac exam shows a regular rate and rhythm with normal heart sounds.  There  is no murmur. Lungs are clear.  Diagnostic Tests:   Narrative & Impression  CLINICAL DATA:  Follow-up lung nodule.   EXAM: CT CHEST WITHOUT CONTRAST   TECHNIQUE: Multidetector CT imaging of the chest was performed following the standard protocol without IV contrast.   RADIATION DOSE REDUCTION: This exam was performed according to the departmental dose-optimization program which includes automated exposure control, adjustment of the mA and/or kV according to patient size and/or use of iterative reconstruction technique.   COMPARISON:  CT chest abdomen and pelvis 03/14/2023. CT chest abdomen and pelvis 09/12/2020.   FINDINGS: Cardiovascular: No significant vascular findings. Normal heart size. No pericardial effusion. There are atherosclerotic calcifications of the aorta and coronary arteries.    Mediastinum/Nodes: No enlarged mediastinal or axillary lymph nodes. Thyroid gland, trachea, and esophagus demonstrate no significant findings.   Lungs/Pleura: Calcified granulomas are again seen in both lungs. Interstitial and ground-glass opacities have significantly decreased in the bilateral inferior lower lobes, but have not completely resolved. Peribronchial wall thickening in the bilateral lower lobes has resolved in the interval. There is no new focal lung consolidation, pleural effusion or pneumothorax. There is a stable subpleural nodular density measuring 4 mm in the left upper lobe image 8/105 unchanged from 2022 compatible with benign etiology.   Upper Abdomen: No acute abnormality. Cholecystectomy clips are present.   Musculoskeletal: Degenerative changes affect the spine.   IMPRESSION: 1. Interstitial and ground-glass opacities have significantly decreased in the bilateral inferior lower lobes, but have not completely resolved. Residual infectious/inflammatory process is a possibility as well as interstitial lung disease. 2. Interval resolution of peribronchial wall thickening in the bilateral lower lobes. 3. Stable 4 mm left upper lobe pulmonary nodule compatible with benign etiology. 4. Aortic atherosclerosis.   Aortic Atherosclerosis (ICD10-I70.0).     Electronically Signed   By: Darliss Cheney M.D.   On: 05/27/2023 22:03       Impression:  The interstitial and groundglass opacities have significantly decreased in both lower lobes but have not completely resolved and may represent a residual infectious/inflammatory process or interstitial lung disease.  The patient is asymptomatic and I do not think any further treatment is needed at this time.  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  I reviewed the CT images with the patient and his wife and answered their questions.  Plan:  I will see him back in 1 year with a CTA of the chest to  follow-up on his aortic arch penetrating ulcer and the interstitial lung changes.  I spent 15 minutes performing this established patient evaluation and > 50% of this time was spent face to face counseling and coordinating the care of this patient's aortic aneurysm.    Alleen Borne, MD [facility] [phone]

## 2023-06-07 ENCOUNTER — Encounter: Payer: Medicare PPO | Admitting: Physical Medicine and Rehabilitation

## 2023-06-09 ENCOUNTER — Other Ambulatory Visit (HOSPITAL_COMMUNITY): Payer: Self-pay

## 2023-06-11 DIAGNOSIS — F331 Major depressive disorder, recurrent, moderate: Secondary | ICD-10-CM | POA: Diagnosis not present

## 2023-06-11 DIAGNOSIS — N1831 Chronic kidney disease, stage 3a: Secondary | ICD-10-CM | POA: Diagnosis not present

## 2023-06-11 DIAGNOSIS — E1140 Type 2 diabetes mellitus with diabetic neuropathy, unspecified: Secondary | ICD-10-CM | POA: Diagnosis not present

## 2023-06-11 DIAGNOSIS — E785 Hyperlipidemia, unspecified: Secondary | ICD-10-CM | POA: Diagnosis not present

## 2023-06-11 DIAGNOSIS — I129 Hypertensive chronic kidney disease with stage 1 through stage 4 chronic kidney disease, or unspecified chronic kidney disease: Secondary | ICD-10-CM | POA: Diagnosis not present

## 2023-06-11 DIAGNOSIS — I719 Aortic aneurysm of unspecified site, without rupture: Secondary | ICD-10-CM | POA: Diagnosis not present

## 2023-06-11 DIAGNOSIS — S140XXA Concussion and edema of cervical spinal cord, initial encounter: Secondary | ICD-10-CM | POA: Diagnosis not present

## 2023-06-11 DIAGNOSIS — I25110 Atherosclerotic heart disease of native coronary artery with unstable angina pectoris: Secondary | ICD-10-CM | POA: Diagnosis not present

## 2023-06-11 DIAGNOSIS — E440 Moderate protein-calorie malnutrition: Secondary | ICD-10-CM | POA: Diagnosis not present

## 2023-06-15 DIAGNOSIS — I10 Essential (primary) hypertension: Secondary | ICD-10-CM | POA: Diagnosis not present

## 2023-06-15 DIAGNOSIS — N1831 Chronic kidney disease, stage 3a: Secondary | ICD-10-CM | POA: Diagnosis not present

## 2023-06-30 ENCOUNTER — Other Ambulatory Visit: Payer: Self-pay | Admitting: Endocrinology

## 2023-06-30 DIAGNOSIS — R1319 Other dysphagia: Secondary | ICD-10-CM

## 2023-07-08 ENCOUNTER — Other Ambulatory Visit (HOSPITAL_COMMUNITY): Payer: Self-pay

## 2023-07-29 ENCOUNTER — Ambulatory Visit: Payer: Medicare HMO | Admitting: Adult Health

## 2023-08-02 ENCOUNTER — Encounter: Attending: Physical Medicine and Rehabilitation | Admitting: Physical Medicine and Rehabilitation

## 2023-08-02 ENCOUNTER — Encounter: Payer: Self-pay | Admitting: Physical Medicine and Rehabilitation

## 2023-08-02 VITALS — BP 91/60 | HR 68 | Ht 74.0 in | Wt 152.0 lb

## 2023-08-02 DIAGNOSIS — I951 Orthostatic hypotension: Secondary | ICD-10-CM | POA: Diagnosis not present

## 2023-08-02 DIAGNOSIS — R269 Unspecified abnormalities of gait and mobility: Secondary | ICD-10-CM | POA: Diagnosis present

## 2023-08-02 DIAGNOSIS — G8330 Monoplegia, unspecified affecting unspecified side: Secondary | ICD-10-CM | POA: Insufficient documentation

## 2023-08-02 DIAGNOSIS — S14109S Unspecified injury at unspecified level of cervical spinal cord, sequela: Secondary | ICD-10-CM | POA: Diagnosis not present

## 2023-08-02 MED ORDER — MIDODRINE HCL 5 MG PO TABS: 5.0000 mg | ORAL_TABLET | Freq: Three times a day (TID) | ORAL | 5 refills | Status: DC

## 2023-08-02 NOTE — Patient Instructions (Signed)
 Patient is a 83 yr old male with Cervical [place] contusion due to orthostatic hypotension due to compression of [place], DM with neuropathy, HLD, RLS, CAD with stenting; chronic anemia; and mild neurogenic bladder Here for f/u on [place] contusion  Unintentional weight loss- could mean something bad- so needs to see PCP immediately.  15 lbs so far- weight is down to 152 lbs today. Also looking at "scans" for vomiting a lot lately.     I'm concerned about his BP being so low- esp since had another fall- restart Midodrine - 5 mg 3x/day-  #90- with 5 refills- take when first wakes up- 7:30 and noon and 4-5 pm- - DON'T stop this medicine unless Cardiology wants you to- which I don't think they will.   3.  I suggest puzzles or writing/to work on the fine motor in R arm-  Slow steady work- because you have the numbness- that i cannot fix.   4. FU in 3months- on SCI and orthostatic hypotension

## 2023-08-02 NOTE — Progress Notes (Signed)
 Subjective:    Patient ID: Harry Zamora, male    DOB: 11/01/1940, 83 y.o.   MRN: [id number]  HPI Patient is a 83 yr old male with Cervical [place] contusion due to orthostatic hypotension, DM with neuropathy, HLD, RLS, CAD with stenting; chronic anemia; and mild neurogenic bladder Here for f/u on [place] contusion   Affected RUE  Losing weight- doesn't know why.  Thinks has appt with PCP- needs to see him.    No major pain except RUE and hand.  Goes numb- cannot write well anymore.   Done with therapy- March is when finished it.   Wife brought him- won't let him drive- Dr Jesse Moritz said no driving.    Cbg's doing pretty well -120s-130s  Just numb in RUE;  not painful.  Back pain- is where has pain- when walking esp a long distance.  Not on pain meds- occ takes tylenol  when bothering him.    BP 91/60 today- comes and goes every day.  Lowest it's been in a  long time.  Has appt with Cards this year- but doesn't know when.   Has eye test coming up.   Doesn't feel dizzy- lightheaded. Today- sometimes feels lightheaded have to stand in place before walks- or BP drops more-   Walks in [facility]  [place] 2 weeks ago- didn't get hurt.  Was due to lightheadedness  Hasn't been taking Midodrine - has been filled once, but not again.   Eating and throwing up- goes down fine, even water , but then has to throw it up- comes up on its own- sometimes feels nauseated when this occurs, but not every time .  Pain Inventory Average Pain 6 Pain Right Now 3 My pain is intermittent, sharp, burning, dull, stabbing, tingling, and aching  In the last 24 hours, has pain interfered with the following? General activity 5 Relation with others 3 Enjoyment of life 0 What TIME of day is your pain at its worst? daytime Sleep (in general) Fair  Pain is worse with: some activites Pain improves with: rest Relief from Meds: fair  Family History  Problem Relation Age of Onset   Arthritis Mother    Hypertension Mother     Diabetes Mother    Heart attack Mother    Cancer Father    Social History   Socioeconomic History   Marital status: Married    Spouse name: Harry Zamora   Number of children: 3   Years of education: 12   Highest education level: Not on file  Occupational History   Occupation: Chief Strategy Officer: [employer]  Tobacco Use   Smoking status: Some Days    Types: Cigars   Smokeless tobacco: Never   Tobacco comments:    occasional  Vaping Use   Vaping status: Never Used  Substance and Sexual Activity   Alcohol use: Not Currently    Comment: social-beer   Drug use: No   Sexual activity: Not on file  Other Topics Concern   Not on file  Social History Narrative   Patient is married Harry Zamora) and lives at home with his wife.   Patient has three children and two- step children.   Patient is working full-time.   Patient has a high school education.   Patient is right-handed.   Patient drinks two cups of coffee, 2-3 sodas and one cup of tea daily.   Social Drivers of Corporate investment banker Strain: Not on file  Food Insecurity: No Food Insecurity (  03/15/2023)   Hunger Vital Sign    Worried About Running Out of Food in the Last Year: Never true    Ran Out of Food in the Last Year: Never true  Transportation Needs: No Transportation Needs (03/15/2023)   PRAPARE - Administrator, Civil Service (Medical): No    Lack of Transportation (Non-Medical): No  Physical Activity: Not on file  Stress: Not on file  Social Connections: Socially Integrated (03/15/2023)   Social Connection and Isolation Panel [NHANES]    Frequency of Communication with Friends and Family: More than three times a week    Frequency of Social Gatherings with Friends and Family: More than three times a week    Attends Religious Services: More than 4 times per year    Active Member of Clubs or Organizations: Yes    Attends Banker Meetings: More than 4 times per year    Marital  Status: Married   Past Surgical History:  Procedure Laterality Date   ABDOMINAL EXPOSURE N/A 07/13/2019   Procedure: ABDOMINAL EXPOSURE;  Surgeon: Margherita Shell, MD;  Location: [facility] OR;  Service: Vascular;  Laterality: N/A;  anterior approach   ANTERIOR LAT LUMBAR FUSION Right 07/13/2019   Procedure: Right Lumbar 3-4 Lumbar 4-5 Anterolateral lumbar interbody fusion;  Surgeon: Manya Sells, MD;  Location: [facility] OR;  Service: Neurosurgery;  Laterality: Right;   ANTERIOR LUMBAR FUSION N/A 07/13/2019   Procedure: Lumbar Five-Sacral One Anterior lumbar interbody fusion;  Surgeon: Manya Sells, MD;  Location: [facility] OR;  Service: Neurosurgery;  Laterality: N/A;  Anterior approach   APPLICATION OF INTRAOPERATIVE CT SCAN N/A 04/24/2021   Procedure: APPLICATION OF INTRAOPERATIVE CT SCAN;  Surgeon: Dawley, Colby Daub, DO;  Location: [facility] OR;  Service: Neurosurgery;  Laterality: N/A;  3C/RM 21   CHOLECYSTECTOMY     COLONOSCOPY     CORONARY STENT INTERVENTION N/A 10/01/2020   Procedure: CORONARY STENT INTERVENTION;  Surgeon: Cody Das, MD;  Location: [facility] INVASIVE CV LAB;  Service: Cardiovascular;  Laterality: N/A;   EYE SURGERY Bilateral    GALLBLADDER SURGERY     HEMORRHOID SURGERY  1983   LEFT HEART CATH AND CORONARY ANGIOGRAPHY N/A 09/17/2020   Procedure: LEFT HEART CATH AND CORONARY ANGIOGRAPHY;  Surgeon: Cody Das, MD;  Location: [facility] INVAS[facility] LAB;  Service: Cardiovascular;  Laterality: N/A;   LUMBAR LAMINECTOMY/DECOMPRESSION MICRODISCECTOMY N/A 10/02/2016   Procedure: L3 to S1 Laminectomy;  Surgeon: Manya Sells, MD;  Location: [facility] OR;  Service: Neurosurgery;  Laterality: N/A;  L3 to S1 Laminectomy   LUMBAR LAMINECTOMY/DECOMPRESSION MICRODISCECTOMY Bilateral 09/07/2019   Procedure: Bilateral Lumbar Five - Sacral One Foraminotomy;  Surgeon: Manya Sells, MD;  Location: [facility] OR;  Service: Neurosurgery;  Laterality: Bilateral;  posterior   LUMBAR PERCUTANEOUS PEDICLE SCREW 3 LEVEL N/A 07/13/2019    Procedure: Percutaneous pedicle screw fixation from Lumbar 3 to Sacral 1;  Surgeon: Manya Sells, MD;  Location: [facility] OR;  Service: Neurosurgery;  Laterality: N/A;   MENISCUS REPAIR Right    PROSTATE SURGERY     SHOULDER SURGERY Left 01/2011   rotator cuff   Past Surgical History:  Procedure Laterality Date   ABDOMINAL EXPOSURE N/A 07/13/2019   Procedure: ABDOMINAL EXPOSURE;  Surgeon: Margherita Shell, MD;  Location: [facility] OR;  Service: Vascular;  Laterality: N/A;  anterior approach   ANTERIOR LAT LUMBAR FUSION Right 07/13/2019   Procedure: Right Lumbar 3-4 Lumbar 4-5 Anterolateral lumbar interbody fusion;  Surgeon: Manya Sells, MD;  Location[facility]  OR;  Service: Neurosurgery;  Laterality: Right;   ANTERIOR LUMBAR FUSION N/A 07/13/2019   Procedure: Lumbar Five-Sacral One Anterior lumbar interbody fusion;  Surgeon: Manya Sells, MD;  Location: [facility] OR;  Service: Neurosurgery;  Laterality: N/A;  Anterior approach   APPLICATION OF INTRAOPERATIVE CT SCAN N/A 04/24/2021   Procedure: APPLICATION OF INTRAOPERATIVE CT SCAN;  Surgeon: Dawley, Colby Daub, DO;  Location: [facility] OR;  Service: Neurosurgery;  Laterality: N/A;  3C/RM 21   CHOLECYSTECTOMY     COLONOSCOPY     CORONARY STENT INTERVENTION N/A 10/01/2020   Procedure: CORONARY STENT INTERVENTION;  Surgeon: Cody Das, MD;  Location: [facility] LAB;  Service: Cardiovascular;  Laterality: N/A;   EYE SURGERY Bilateral    GALLBLADDER SURGERY     HEMORRHOID SURGERY  1983   LEFT HEART CATH AND CORONARY ANGIOGRAPHY N/A 09/17/2020   Procedure: LEFT HEART CATH AND CORONARY ANGIOGRAPHY;  Surgeon: Cody Das, MD;  [facility]: [facility] INV[facility] LAB;  Service: Cardiovascular;  Laterality: N/A;   LUMBAR LAMINECTOMY/DECOMPRESSION MICRODISCECTOMY N/A 10/02/2016   Procedure: L3 to S1 Laminectomy;  Surgeon: Manya Sells, MD;  Location: [facility] OR;  Service: Neurosurgery;  Laterality: N/A;  L3 to S1 Laminectomy   LUMBAR LAMINECTOMY/DECOMPRESSION MICRODISCECTOMY  Bilateral 09/07/2019   Procedure: Bilateral Lumbar Five - Sacral One Foraminotomy;  Surgeon: Manya Sells, MD;  Location: [facility] OR;  Service: Neurosurgery;  Laterality: Bilateral;  posterior   LUMBAR PERCUTANEOUS PEDICLE SCREW 3 LEVEL N/A 07/13/2019   Procedure: Percutaneous pedicle screw fixation from Lumbar 3 to Sacral 1;  Surgeon: Manya Sells, MD;  Location: [facility] OR;  Service: Neurosurgery;  Laterality: N/A;   MENISCUS REPAIR Right    PROSTATE SURGERY     SHOULDER SURGERY Left 01/2011   rotator cuff   Past Medical History:  Diagnosis Date   Anemia    as an infant   Arthritis    Back pain    Depression    Disc displacement, lumbar    Gynecomastia    History of kidney stones    Hyperlipidemia    Hypertension    no longer on medications   Hypertriglyceridemia    Insomnia    Low back pain    Lumbar radiculopathy    Lumbar stenosis    OA (osteoarthritis)    Polyneuropathy in diabetes(357.2)    Prostate cancer (HCC) 2003   Restless legs    Ringing in ears    RLS (restless legs syndrome)    Type 2 diabetes mellitus (HCC)    There were no vitals taken for this visit.  Opioid Risk Score:   Fall Risk Score:  `1  Depression screen [facility] 2/9     04/13/2023    9:48 AM  Depression screen [facility] 2/9  Decreased Interest 1  Down, Depressed, Hopeless 0  PHQ - 2 Score 1  Altered sleeping 0  Tired, decreased energy 0  Change in appetite 0  Feeling bad or failure about yourself  0  Trouble concentrating 0  Moving slowly or fidgety/restless 0  Suicidal thoughts 0  PHQ-9 Score 1    Review of Systems  Musculoskeletal:  Positive for back pain and gait problem.  All other systems reviewed and are negative.      Objective:   Physical Exam Awake, alert, appropriate, using Single point cane to walk, NAD Cold on both Ue's- entire arms  MSK; LUE 5/5 RUE- Biceps 4+/5; triceps 4+/5; WE 4/5; Grip 4/5; and FA 3-/5   Neuro: Almost absent sensation  in RUE- from C4 to T1 No spasticity  or increased tone in RUE No clonus at wrist or Hoffman's in RUE  Fine motor skills decreased in RUE     Assessment & Plan:   Patient is a 83 yr old male with Cervical [place] contusion due to orthostatic hypotension due to compression of [place], DM with neuropathy, HLD, RLS, CAD with stenting; chronic anemia; and mild neurogenic bladder Here for f/u on [place] contusion  Unintentional weight loss- could mean something bad- so needs to see PCP immediately.  15 lbs so far- weight is down to 152 lbs today. Also looking at "scans" for vomiting a lot lately.     I'm concerned about his BP being so low- esp since had another fall- restart Midodrine - 5 mg 3x/day-  #90- with 5 refills- take when first wakes up- 7:30 and noon and 4-5 pm- - DON'T stop this medicine unless Cardiology wants you to- which I don't think they will.   3.  I suggest puzzles or writing/to work on the fine motor in R arm-  Slow steady work- because you have the numbness- that i cannot fix.   4. FU in 3months- on SCI and orthostatic hypotension    I spent a total of 23   minutes on total care today- >50% coordination of care- due to d/w pt about OH and Midodrine  need as well as unintentional weight loss.

## 2023-08-05 ENCOUNTER — Ambulatory Visit
Admission: RE | Admit: 2023-08-05 | Discharge: 2023-08-05 | Disposition: A | Discharge status: Home / Self Care | Source: Ambulatory Visit | Attending: Endocrinology | Admitting: Endocrinology

## 2023-08-05 DIAGNOSIS — R1319 Other dysphagia: Secondary | ICD-10-CM

## 2023-09-06 ENCOUNTER — Other Ambulatory Visit (HOSPITAL_COMMUNITY): Payer: Self-pay

## 2023-09-06 MED ORDER — FREESTYLE LIBRE 3 SENSOR MISC
3 refills | Status: AC
  Filled 2023-09-06: qty 9, 84d supply, fill #0
  Filled 2024-02-02 – 2024-02-07 (×2): qty 9, 84d supply, fill #1

## 2023-09-07 ENCOUNTER — Other Ambulatory Visit (HOSPITAL_COMMUNITY): Payer: Self-pay

## 2023-09-10 ENCOUNTER — Other Ambulatory Visit: Payer: Self-pay | Admitting: Cardiology

## 2023-09-14 ENCOUNTER — Other Ambulatory Visit (HOSPITAL_COMMUNITY): Payer: Self-pay

## 2023-09-21 ENCOUNTER — Other Ambulatory Visit (HOSPITAL_BASED_OUTPATIENT_CLINIC_OR_DEPARTMENT_OTHER): Payer: Self-pay

## 2023-09-23 ENCOUNTER — Other Ambulatory Visit (HOSPITAL_COMMUNITY): Payer: Self-pay

## 2023-10-06 ENCOUNTER — Emergency Department (HOSPITAL_BASED_OUTPATIENT_CLINIC_OR_DEPARTMENT_OTHER)

## 2023-10-06 ENCOUNTER — Other Ambulatory Visit: Payer: Self-pay

## 2023-10-06 ENCOUNTER — Emergency Department (HOSPITAL_BASED_OUTPATIENT_CLINIC_OR_DEPARTMENT_OTHER)
Admission: EM | Admit: 2023-10-06 | Discharge: 2023-10-06 | Disposition: A | Discharge status: Home / Self Care | Attending: Emergency Medicine | Admitting: Emergency Medicine

## 2023-10-06 ENCOUNTER — Encounter (HOSPITAL_BASED_OUTPATIENT_CLINIC_OR_DEPARTMENT_OTHER): Payer: Self-pay | Admitting: Emergency Medicine

## 2023-10-06 DIAGNOSIS — Z7982 Long term (current) use of aspirin: Secondary | ICD-10-CM | POA: Diagnosis not present

## 2023-10-06 DIAGNOSIS — R630 Anorexia: Secondary | ICD-10-CM | POA: Diagnosis not present

## 2023-10-06 DIAGNOSIS — R531 Weakness: Secondary | ICD-10-CM | POA: Insufficient documentation

## 2023-10-06 DIAGNOSIS — M542 Cervicalgia: Secondary | ICD-10-CM | POA: Diagnosis not present

## 2023-10-06 DIAGNOSIS — R1110 Vomiting, unspecified: Secondary | ICD-10-CM | POA: Insufficient documentation

## 2023-10-06 LAB — COMPREHENSIVE METABOLIC PANEL WITH GFR
ALT: 27 U/L (ref 0–44)
AST: 36 U/L (ref 15–41)
Albumin: 4.2 g/dL (ref 3.5–5.0)
Alkaline Phosphatase: 89 U/L (ref 38–126)
Anion gap: 14 (ref 5–15)
BUN: 25 mg/dL — ABNORMAL HIGH (ref 8–23)
CO2: 27 mmol/L (ref 22–32)
Calcium: 9.9 mg/dL (ref 8.9–10.3)
Chloride: 98 mmol/L (ref 98–111)
Creatinine, Ser: 0.94 mg/dL (ref 0.61–1.24)
GFR, Estimated: >60 mL/min (ref >60)
Glucose, Bld: 78 mg/dL (ref 70–99)
Potassium: 4.4 mmol/L (ref 3.5–5.1)
Sodium: 138 mmol/L (ref 135–145)
Total Bilirubin: 0.9 mg/dL (ref 0.0–1.2)
Total Protein: 7.4 g/dL (ref 6.5–8.1)

## 2023-10-06 LAB — URINALYSIS, ROUTINE W REFLEX MICROSCOPIC
Bilirubin Urine: NEGATIVE
Glucose, UA: NEGATIVE mg/dL
Hgb urine dipstick: NEGATIVE
Ketones, ur: 15 mg/dL — AB
Leukocytes,Ua: NEGATIVE
Nitrite: NEGATIVE
Specific Gravity, Urine: 1.027 (ref 1.005–1.030)
pH: 5.5 (ref 5.0–8.0)

## 2023-10-06 LAB — RESP PANEL BY RT-PCR (RSV, FLU A&B, COVID)  RVPGX2
Influenza A by PCR: NEGATIVE
Influenza B by PCR: NEGATIVE
Resp Syncytial Virus by PCR: NEGATIVE
SARS Coronavirus 2 by RT PCR: NEGATIVE

## 2023-10-06 LAB — CBC WITH DIFFERENTIAL/PLATELET
Abs Immature Granulocytes: 0.04 K/uL (ref 0.00–0.07)
Basophils Absolute: 0 K/uL (ref 0.0–0.1)
Basophils Relative: 0 %
Eosinophils Absolute: 0.1 K/uL (ref 0.0–0.5)
Eosinophils Relative: 1 %
HCT: 37.3 % — ABNORMAL LOW (ref 39.0–52.0)
Hemoglobin: 12.5 g/dL — ABNORMAL LOW (ref 13.0–17.0)
Immature Granulocytes: 0 %
Lymphocytes Relative: 11 %
Lymphs Abs: 1.2 K/uL (ref 0.7–4.0)
MCH: 31.5 pg (ref 26.0–34.0)
MCHC: 33.5 g/dL (ref 30.0–36.0)
MCV: 94 fL (ref 80.0–100.0)
Monocytes Absolute: 0.8 K/uL (ref 0.1–1.0)
Monocytes Relative: 8 %
Neutro Abs: 8.6 K/uL — ABNORMAL HIGH (ref 1.7–7.7)
Neutrophils Relative %: 80 %
Platelets: 215 K/uL (ref 150–400)
RBC: 3.97 MIL/uL — ABNORMAL LOW (ref 4.22–5.81)
RDW: 13.4 % (ref 11.5–15.5)
WBC: 10.7 K/uL — ABNORMAL HIGH (ref 4.0–10.5)
nRBC: 0 % (ref 0.0–0.2)

## 2023-10-06 LAB — LIPASE, BLOOD: Lipase: 18 U/L (ref 11–51)

## 2023-10-06 MED ORDER — SODIUM CHLORIDE 0.9 % IV BOLUS
500.0000 mL | Freq: Once | INTRAVENOUS | Status: AC
  Administered 2023-10-06: 500 mL via INTRAVENOUS

## 2023-10-06 NOTE — Discharge Instructions (Signed)
 You have been seen and discharged from the emergency department.  Your blood work and urine were normal.  You were given IV fluids.  Follow-up with your primary provider for further evaluation and further care. Take home medications as prescribed. If you have any worsening symptoms or further concerns for your health please return to an emergency department for further evaluation.

## 2023-10-06 NOTE — ED Provider Notes (Signed)
 [place] EMERGENCY DEPARTMENT AT [facility] Provider Note   CSN: [id number] Arrival date & time: 10/06/23  1459     Patient presents with: Weakness   Harry Zamora is a 83 y.o. male.  {Add pertinent medical, surgical, social history, OB history to HPI:32945} HPI   83 year old male presents emergency department with generalized weakness as well as a fall yesterday with head injury.  Patient states over the past week he has had a decreased appetite.  Has not wanted to eat or drink anything.  Denies any abdominal pain or nausea.  Has had scattered episodes of emesis but states he is having normal urine/stool production.  Denies any blood in the urine/stool.  No specific abdominal pain.  Denies any fever/chills.  He has no chest pain, shortness of breath or cough.  Had a mechanical fall yesterday where his legs gave out and he fell back, hitting his head on concrete.  No loss of consciousness.  Complaining of posterior head/upper neck pain.  Otherwise compliant with medications.  Prior to Admission medications   Medication Sig Start Date End Date Taking? Authorizing Provider  ACCU-CHEK GUIDE TEST test strip  06/06/23   [provider]  acetaminophen  (TYLENOL ) 325 MG tablet Take 2 tablets (650 mg total) by mouth every 4 (four) hours as needed for mild pain (pain score 1-3) (or temp > 37.5 C (99.5 F)). 03/30/23   Angiulli, Toribio PARAS, PA-C  ascorbic acid  (VITAMIN C ) 500 MG tablet Take 1 tablet (500 mg total) by mouth daily. 03/31/23   Angiulli, Toribio PARAS, PA-C  aspirin  EC 81 MG tablet Take 1 tablet (81 mg total) by mouth daily. Swallow whole. 03/24/23   Remi Pippin, NP  Calcium  Carb-Cholecalciferol  (CALCIUM  1000 + D PO) Take by mouth.    [provider]  Continuous Glucose Sensor (FREESTYLE LIBRE 3 SENSOR) MISC Change sensor topically every 14 days to monitor glucose continously. 09/06/23     cyanocobalamin  1000 MCG tablet Take 1 tablet (1,000 mcg total) by mouth daily.  03/31/23   Angiulli, Toribio PARAS, PA-C  Dulaglutide  (TRULICITY ) 3 MG/0.5ML SOAJ Inject 3 mg into the skin once a week. Patient taking differently: Inject 3 mg into the skin once a week. Inject on Wednesday 02/03/23     DULoxetine  (CYMBALTA ) 30 MG capsule Take 1 capsule (30 mg total) by mouth daily. 03/31/23   Angiulli, Toribio PARAS, PA-C  ferrous sulfate  325 (65 FE) MG tablet Take 1 tablet (325 mg total) by mouth daily with breakfast. 03/31/23   Angiulli, Toribio PARAS, PA-C  gabapentin  (NEURONTIN ) 100 MG capsule Take 1 capsule (100 mg total) by mouth at bedtime. 03/31/23   Angiulli, Toribio PARAS, PA-C  insulin  glargine-yfgn (SEMGLEE) 100 UNIT/ML injection Inject 18 Units into the skin daily.    [provider]  metFORMIN  (GLUCOPHAGE ) 1000 MG tablet Take 1 tablet (1,000 mg total) by mouth 2 (two) times daily with a meal. 03/31/23   Angiulli, Toribio PARAS, PA-C  metFORMIN  (GLUCOPHAGE ) 1000 MG tablet 1 tablet with a meal Orally twice a day for 90 days    [provider]  midodrine  (PROAMATINE ) 5 MG tablet Take 1 tablet (5 mg total) by mouth 3 (three) times daily with meals. Will refill until I see him in clinic- but if he doesn't come to visit, cannot refill past 3 months 05/26/23   Lovorn, Megan, MD  midodrine  (PROAMATINE ) 5 MG tablet Take 1 tablet (5 mg total) by mouth 3 (three) times daily with meals. 08/02/23  Lovorn, Megan, MD  Multiple Vitamins-Minerals (MENS 50+ MULTI VITAMIN/MIN) TABS Take 1 tablet by mouth daily.    [provider]  nitroGLYCERIN  (NITROSTAT ) 0.4 MG SL tablet Place 1 tablet (0.4 mg total) under the tongue every 5 (five) minutes as needed for chest pain. 09/02/20 03/23/23  Patwardhan, Newman PARAS, MD  pantoprazole  (PROTONIX ) 40 MG tablet Take 1 tablet (40 mg total) by mouth daily. 03/31/23   Angiulli, Toribio PARAS, PA-C  rOPINIRole  (REQUIP ) 2 MG tablet Take 2 tablets (4 mg total) by mouth at bedtime. 03/31/23   Angiulli, Toribio PARAS, PA-C  rosuvastatin  (CRESTOR ) 20 MG tablet Take 1 tablet (20  mg total) by mouth daily. 03/31/23   Angiulli, Toribio PARAS, PA-C  rosuvastatin  (CRESTOR ) 40 MG tablet TAKE 1 TABLET(40 MG) BY MOUTH DAILY 09/10/23   Patwardhan, Newman PARAS, MD  TRESIBA FLEXTOUCH 100 UNIT/ML FlexTouch Pen daily. Inject on Wednesday    [provider]    Allergies: Atorvastatin, Niacin, Levaquin [levofloxacin in d5w], Niaspan [niacin er (antihyperlipidemic)], and Tramadol    Review of Systems  Constitutional:  Positive for appetite change and fatigue. Negative for fever.  Respiratory:  Negative for cough and shortness of breath.   Cardiovascular:  Negative for chest pain and leg swelling.  Gastrointestinal:  Negative for abdominal pain, diarrhea and vomiting.  Genitourinary:  Negative for dysuria and hematuria.  Musculoskeletal:  Positive for neck pain. Negative for neck stiffness.  Skin:  Negative for rash.  Neurological:  Positive for headaches.    Updated Vital Signs BP 121/65   Pulse 86   Temp 97.6 F (36.4 C) (Oral)   Resp 20   SpO2 98%   Physical Exam Vitals and nursing note reviewed.  Constitutional:      General: He is not in acute distress.    Appearance: Normal appearance.  HENT:     Head: Normocephalic.     Comments: Small superficial area of redness on the posterior scalp    Mouth/Throat:     Mouth: Mucous membranes are moist.  Eyes:     Extraocular Movements: Extraocular movements intact.     Pupils: Pupils are equal, round, and reactive to light.  Neck:     Comments: Tenderness along the paracervical musculature but no midline spinal tenderness Cardiovascular:     Rate and Rhythm: Normal rate.  Pulmonary:     Effort: Pulmonary effort is normal. No respiratory distress.  Abdominal:     Palpations: Abdomen is soft.     Tenderness: There is no abdominal tenderness.  Musculoskeletal:     Cervical back: Normal range of motion and neck supple. Tenderness present.  Skin:    General: Skin is warm.  Neurological:     Mental Status: He is  alert and oriented to person, place, and time. Mental status is at baseline.  Psychiatric:        Mood and Affect: Mood normal.     (all labs ordered are listed, but only abnormal results are displayed) Labs Reviewed  RESP PANEL BY RT-PCR (RSV, FLU A&B, COVID)  [id number]  CBC WITH DIFFERENTIAL/PLATELET  URINALYSIS, ROUTINE W REFLEX MICROSCOPIC  COMPREHENSIVE METABOLIC PANEL WITH GFR  LIPASE, BLOOD    EKG: EKG Interpretation Date/Time:  Wednesday October 06 2023 15:08:24 EDT Ventricular Rate:  93 PR Interval:  160 QRS Duration:  92 QT Interval:  357 QTC Calculation: 444 R Axis:   79  Text Interpretation: Sinus rhythm Baseline wander in lead(s) V3 No significant change since last tracing Confirmed by  Emil Share [phone]) on 10/06/2023 3:09:57 PM  Radiology: No results found.  {Document cardiac monitor, telemetry assessment procedure when appropriate:32947} Procedures   Medications Ordered in the ED - No data to display    {Click here for ABCD2, HEART and other calculators REFRESH Note before signing:1}                              Medical Decision Making Amount and/or Complexity of Data Reviewed Labs: ordered. Radiology: ordered.   ***  {Document critical care time when appropriate  Document review of labs and clinical decision tools ie [id number], etc  Document your independent review of radiology images and any outside records  Document your discussion with family members, caretakers and with consultants  Document social determinants of health affecting pt's care  Document your decision making why or why not admission, treatments were needed:32947:::1}   Final diagnoses:  None    ED Discharge Orders     None

## 2023-10-06 NOTE — ED Triage Notes (Signed)
 Weakness x months Frequent falls Hit head a few days ago Not eating well

## 2023-10-15 ENCOUNTER — Other Ambulatory Visit: Payer: Self-pay

## 2023-10-15 ENCOUNTER — Encounter (HOSPITAL_COMMUNITY): Payer: Self-pay

## 2023-10-15 ENCOUNTER — Inpatient Hospital Stay (HOSPITAL_COMMUNITY)
Admission: EM | Admit: 2023-10-15 | Discharge: 2023-10-17 | DRG: 683 | Disposition: A | Discharge status: Home Health | Attending: Internal Medicine | Admitting: Internal Medicine

## 2023-10-15 ENCOUNTER — Emergency Department (HOSPITAL_COMMUNITY)

## 2023-10-15 DIAGNOSIS — I10 Essential (primary) hypertension: Secondary | ICD-10-CM | POA: Diagnosis present

## 2023-10-15 DIAGNOSIS — E785 Hyperlipidemia, unspecified: Secondary | ICD-10-CM | POA: Diagnosis present

## 2023-10-15 DIAGNOSIS — Z8249 Family history of ischemic heart disease and other diseases of the circulatory system: Secondary | ICD-10-CM

## 2023-10-15 DIAGNOSIS — Z8546 Personal history of malignant neoplasm of prostate: Secondary | ICD-10-CM

## 2023-10-15 DIAGNOSIS — K224 Dyskinesia of esophagus: Secondary | ICD-10-CM | POA: Diagnosis present

## 2023-10-15 DIAGNOSIS — I6523 Occlusion and stenosis of bilateral carotid arteries: Secondary | ICD-10-CM | POA: Insufficient documentation

## 2023-10-15 DIAGNOSIS — E1122 Type 2 diabetes mellitus with diabetic chronic kidney disease: Secondary | ICD-10-CM | POA: Diagnosis present

## 2023-10-15 DIAGNOSIS — Z7985 Long-term (current) use of injectable non-insulin antidiabetic drugs: Secondary | ICD-10-CM

## 2023-10-15 DIAGNOSIS — I2510 Atherosclerotic heart disease of native coronary artery without angina pectoris: Secondary | ICD-10-CM | POA: Diagnosis not present

## 2023-10-15 DIAGNOSIS — Z79899 Other long term (current) drug therapy: Secondary | ICD-10-CM

## 2023-10-15 DIAGNOSIS — Z7984 Long term (current) use of oral hypoglycemic drugs: Secondary | ICD-10-CM

## 2023-10-15 DIAGNOSIS — N179 Acute kidney failure, unspecified: Principal | ICD-10-CM | POA: Diagnosis present

## 2023-10-15 DIAGNOSIS — E861 Hypovolemia: Secondary | ICD-10-CM | POA: Diagnosis present

## 2023-10-15 DIAGNOSIS — Z8261 Family history of arthritis: Secondary | ICD-10-CM

## 2023-10-15 DIAGNOSIS — W19XXXA Unspecified fall, initial encounter: Secondary | ICD-10-CM | POA: Diagnosis present

## 2023-10-15 DIAGNOSIS — I719 Aortic aneurysm of unspecified site, without rupture: Secondary | ICD-10-CM | POA: Insufficient documentation

## 2023-10-15 DIAGNOSIS — I739 Peripheral vascular disease, unspecified: Secondary | ICD-10-CM | POA: Diagnosis present

## 2023-10-15 DIAGNOSIS — E119 Type 2 diabetes mellitus without complications: Secondary | ICD-10-CM

## 2023-10-15 DIAGNOSIS — R531 Weakness: Secondary | ICD-10-CM | POA: Diagnosis not present

## 2023-10-15 DIAGNOSIS — R0989 Other specified symptoms and signs involving the circulatory and respiratory systems: Secondary | ICD-10-CM | POA: Diagnosis present

## 2023-10-15 DIAGNOSIS — F17290 Nicotine dependence, other tobacco product, uncomplicated: Secondary | ICD-10-CM | POA: Diagnosis present

## 2023-10-15 DIAGNOSIS — D649 Anemia, unspecified: Secondary | ICD-10-CM | POA: Insufficient documentation

## 2023-10-15 DIAGNOSIS — I700 Atherosclerosis of aorta: Secondary | ICD-10-CM | POA: Diagnosis present

## 2023-10-15 DIAGNOSIS — Z833 Family history of diabetes mellitus: Secondary | ICD-10-CM

## 2023-10-15 DIAGNOSIS — G8929 Other chronic pain: Secondary | ICD-10-CM | POA: Diagnosis present

## 2023-10-15 DIAGNOSIS — I951 Orthostatic hypotension: Secondary | ICD-10-CM | POA: Diagnosis present

## 2023-10-15 DIAGNOSIS — G2581 Restless legs syndrome: Secondary | ICD-10-CM | POA: Diagnosis present

## 2023-10-15 DIAGNOSIS — J849 Interstitial pulmonary disease, unspecified: Secondary | ICD-10-CM | POA: Diagnosis present

## 2023-10-15 DIAGNOSIS — Z955 Presence of coronary angioplasty implant and graft: Secondary | ICD-10-CM

## 2023-10-15 DIAGNOSIS — N1831 Chronic kidney disease, stage 3a: Secondary | ICD-10-CM | POA: Diagnosis present

## 2023-10-15 DIAGNOSIS — F331 Major depressive disorder, recurrent, moderate: Secondary | ICD-10-CM | POA: Diagnosis present

## 2023-10-15 DIAGNOSIS — E1151 Type 2 diabetes mellitus with diabetic peripheral angiopathy without gangrene: Secondary | ICD-10-CM | POA: Diagnosis present

## 2023-10-15 DIAGNOSIS — Z8679 Personal history of other diseases of the circulatory system: Secondary | ICD-10-CM

## 2023-10-15 DIAGNOSIS — Z8619 Personal history of other infectious and parasitic diseases: Secondary | ICD-10-CM

## 2023-10-15 DIAGNOSIS — M48062 Spinal stenosis, lumbar region with neurogenic claudication: Secondary | ICD-10-CM | POA: Diagnosis present

## 2023-10-15 DIAGNOSIS — E781 Pure hyperglyceridemia: Secondary | ICD-10-CM | POA: Diagnosis present

## 2023-10-15 DIAGNOSIS — I129 Hypertensive chronic kidney disease with stage 1 through stage 4 chronic kidney disease, or unspecified chronic kidney disease: Secondary | ICD-10-CM | POA: Diagnosis present

## 2023-10-15 DIAGNOSIS — R42 Dizziness and giddiness: Principal | ICD-10-CM

## 2023-10-15 DIAGNOSIS — C61 Malignant neoplasm of prostate: Secondary | ICD-10-CM | POA: Insufficient documentation

## 2023-10-15 DIAGNOSIS — Z8701 Personal history of pneumonia (recurrent): Secondary | ICD-10-CM

## 2023-10-15 DIAGNOSIS — Z7982 Long term (current) use of aspirin: Secondary | ICD-10-CM

## 2023-10-15 DIAGNOSIS — Z87442 Personal history of urinary calculi: Secondary | ICD-10-CM

## 2023-10-15 DIAGNOSIS — K760 Fatty (change of) liver, not elsewhere classified: Secondary | ICD-10-CM | POA: Diagnosis present

## 2023-10-15 DIAGNOSIS — Z981 Arthrodesis status: Secondary | ICD-10-CM

## 2023-10-15 DIAGNOSIS — E1142 Type 2 diabetes mellitus with diabetic polyneuropathy: Secondary | ICD-10-CM | POA: Diagnosis not present

## 2023-10-15 DIAGNOSIS — M1990 Unspecified osteoarthritis, unspecified site: Secondary | ICD-10-CM | POA: Diagnosis present

## 2023-10-15 DIAGNOSIS — D631 Anemia in chronic kidney disease: Secondary | ICD-10-CM | POA: Diagnosis present

## 2023-10-15 HISTORY — DX: Acute respiratory failure with hypoxia: J96.01

## 2023-10-15 LAB — CBC WITH DIFFERENTIAL/PLATELET
Abs Immature Granulocytes: 0.02 K/uL (ref 0.00–0.07)
Basophils Absolute: 0 K/uL (ref 0.0–0.1)
Basophils Relative: 0 %
Eosinophils Absolute: 0.2 K/uL (ref 0.0–0.5)
Eosinophils Relative: 2 %
HCT: 35.4 % — ABNORMAL LOW (ref 39.0–52.0)
Hemoglobin: 11.6 g/dL — ABNORMAL LOW (ref 13.0–17.0)
Immature Granulocytes: 0 %
Lymphocytes Relative: 27 %
Lymphs Abs: 1.8 K/uL (ref 0.7–4.0)
MCH: 31 pg (ref 26.0–34.0)
MCHC: 32.8 g/dL (ref 30.0–36.0)
MCV: 94.7 fL (ref 80.0–100.0)
Monocytes Absolute: 0.5 K/uL (ref 0.1–1.0)
Monocytes Relative: 8 %
Neutro Abs: 4.2 K/uL (ref 1.7–7.7)
Neutrophils Relative %: 63 %
Platelets: 239 K/uL (ref 150–400)
RBC: 3.74 MIL/uL — ABNORMAL LOW (ref 4.22–5.81)
RDW: 13.4 % (ref 11.5–15.5)
WBC: 6.7 K/uL (ref 4.0–10.5)
nRBC: 0 % (ref 0.0–0.2)

## 2023-10-15 LAB — COMPREHENSIVE METABOLIC PANEL WITH GFR
ALT: 15 U/L (ref 0–44)
AST: 19 U/L (ref 15–41)
Albumin: 3.5 g/dL (ref 3.5–5.0)
Alkaline Phosphatase: 60 U/L (ref 38–126)
Anion gap: 10 (ref 5–15)
BUN: 31 mg/dL — ABNORMAL HIGH (ref 8–23)
CO2: 28 mmol/L (ref 22–32)
Calcium: 9.3 mg/dL (ref 8.9–10.3)
Chloride: 100 mmol/L (ref 98–111)
Creatinine, Ser: 1.66 mg/dL — ABNORMAL HIGH (ref 0.61–1.24)
GFR, Estimated: 40 mL/min — ABNORMAL LOW (ref >60)
Glucose, Bld: 90 mg/dL (ref 70–99)
Potassium: 4.2 mmol/L (ref 3.5–5.1)
Sodium: 138 mmol/L (ref 135–145)
Total Bilirubin: 0.9 mg/dL (ref 0.0–1.2)
Total Protein: 6.6 g/dL (ref 6.5–8.1)

## 2023-10-15 LAB — URINALYSIS, ROUTINE W REFLEX MICROSCOPIC
Bilirubin Urine: NEGATIVE
Glucose, UA: NEGATIVE mg/dL
Hgb urine dipstick: NEGATIVE
Ketones, ur: NEGATIVE mg/dL
Leukocytes,Ua: NEGATIVE
Nitrite: NEGATIVE
Protein, ur: NEGATIVE mg/dL
Specific Gravity, Urine: >1.046 — ABNORMAL HIGH (ref 1.005–1.030)
pH: 5 (ref 5.0–8.0)

## 2023-10-15 LAB — GLUCOSE, CAPILLARY: Glucose-Capillary: 144 mg/dL — ABNORMAL HIGH (ref 70–99)

## 2023-10-15 LAB — CBG MONITORING, ED
Glucose-Capillary: 68 mg/dL — ABNORMAL LOW (ref 70–99)
Glucose-Capillary: 133 mg/dL — ABNORMAL HIGH (ref 70–99)

## 2023-10-15 MED ORDER — ROSUVASTATIN CALCIUM 20 MG PO TABS
20.0000 mg | ORAL_TABLET | Freq: Every day | ORAL | Status: DC
  Administered 2023-10-15 – 2023-10-16 (×2): 20 mg via ORAL
  Filled 2023-10-15 (×2): qty 1

## 2023-10-15 MED ORDER — ACETAMINOPHEN 650 MG RE SUPP: 650.0000 mg | Freq: Four times a day (QID) | RECTAL | Status: DC | PRN

## 2023-10-15 MED ORDER — SODIUM CHLORIDE 0.9% FLUSH
3.0000 mL | Freq: Two times a day (BID) | INTRAVENOUS | Status: DC
  Administered 2023-10-15 – 2023-10-17 (×5): 3 mL via INTRAVENOUS

## 2023-10-15 MED ORDER — LACTATED RINGERS IV BOLUS
1000.0000 mL | Freq: Once | INTRAVENOUS | Status: AC
  Administered 2023-10-15: 1000 mL via INTRAVENOUS

## 2023-10-15 MED ORDER — ASPIRIN 81 MG PO TBEC
81.0000 mg | DELAYED_RELEASE_TABLET | Freq: Every day | ORAL | Status: DC
Start: 2023-10-16 — End: 2023-10-17
  Administered 2023-10-16 – 2023-10-17 (×2): 81 mg via ORAL
  Filled 2023-10-15 (×2): qty 1

## 2023-10-15 MED ORDER — IOHEXOL 350 MG/ML SOLN
75.0000 mL | Freq: Once | INTRAVENOUS | Status: AC | PRN
  Administered 2023-10-15: 75 mL via INTRAVENOUS

## 2023-10-15 MED ORDER — POLYETHYLENE GLYCOL 3350 17 G PO PACK: 17.0000 g | PACK | Freq: Every day | ORAL | Status: DC | PRN

## 2023-10-15 MED ORDER — SODIUM CHLORIDE 0.9 % IV BOLUS
500.0000 mL | Freq: Once | INTRAVENOUS | Status: AC
  Administered 2023-10-15: 500 mL via INTRAVENOUS

## 2023-10-15 MED ORDER — SODIUM CHLORIDE 0.9 % IV SOLN: INTRAVENOUS | Status: AC

## 2023-10-15 MED ORDER — ACETAMINOPHEN 325 MG PO TABS: 650.0000 mg | ORAL_TABLET | Freq: Four times a day (QID) | ORAL | Status: DC | PRN

## 2023-10-15 MED ORDER — MIDODRINE HCL 5 MG PO TABS
5.0000 mg | ORAL_TABLET | Freq: Three times a day (TID) | ORAL | Status: DC
  Administered 2023-10-15 – 2023-10-17 (×7): 5 mg via ORAL
  Filled 2023-10-15 (×7): qty 1

## 2023-10-15 MED ORDER — ENOXAPARIN SODIUM 40 MG/0.4ML IJ SOSY
40.0000 mg | PREFILLED_SYRINGE | INTRAMUSCULAR | Status: DC
  Administered 2023-10-15 – 2023-10-16 (×2): 40 mg via SUBCUTANEOUS
  Filled 2023-10-15 (×2): qty 0.4

## 2023-10-15 MED ORDER — PANTOPRAZOLE SODIUM 40 MG PO TBEC
40.0000 mg | DELAYED_RELEASE_TABLET | Freq: Every day | ORAL | Status: DC
  Administered 2023-10-16 – 2023-10-17 (×2): 40 mg via ORAL
  Filled 2023-10-15 (×2): qty 1

## 2023-10-15 NOTE — H&P (Signed)
 History and Physical   Harry Zamora FMW:[id number] DOB: 10/01/1940 DOA: 10/15/2023  PCP: Nichole Senior, MD   Patient coming from: Home  Chief Complaint:, Weakness, falls  HPI: Harry Zamora is a 83 y.o. male with medical history significant of hypertension, hyperlipidemia, diabetes, neuropathy, depression, RLS, syncope, CKD 3A, CAD, PAD, carotid artery disease, aortic Rosal, aortic aneurysm, anemia, prostate cancer, ILD, hepatic steatosis, L-spine stenosis, chronic pain presenting after falls and weakness today.  Patient reporting 2 falls today within 20 minutes of each other with legs felt weak thin just gave out.  Did fall on his back in his head but no loss of consciousness.  Was recently diagnosed with esophageal dysmotility, and has had some weakness and weight loss in the setting of this.  Denies fevers, chills, chest pain, shortness of breath, abdominal pain, constipation, diarrhea, nausea, vomiting.  ED Course: Vital signs in the ED notable for blood pressure in the 90s-120s systolic.  Orthostatics initially positive from 124->73 systolic from laying to sitting.  Repeat orthostatics again positive from sitting to standing going from systolic BP of 118->60.  Unable to tolerate standing 3 minutes.  Lab work included CMP with BUN 31, creatinine elevated to 1.66 from baseline of 1.  CBC with hemoglobin stable 11.6.  Glucose 68.  Urinalysis normal.  CT head showed no acute normality.  CT max face study showed no acute abnormality.  CT C-spine showed no acute abnormality but did show chronic disease.  CT chest abdomen pelvis with contrast showed no acute abnormality but did show chronic Edema, chronic spine changes and status post surgery, chronic aortic atherosclerosis.  Patient received midodrine  and 1.5 L IV fluid.   Review of Systems: As per HPI otherwise all other systems reviewed and are negative.  Past Medical History:  Diagnosis Date   Acute respiratory failure with  hypoxia (HCC)    AKI (acute kidney injury) (HCC) 03/13/2021   Anemia    as an infant   Arthritis    Back pain    Confusion 11/18/2022   Depression    Disc displacement, lumbar    Gynecomastia    History of kidney stones    Hyperlipidemia    Hypertension    no longer on medications   Hypertriglyceridemia    Insomnia    Low back pain    Lumbar radiculopathy    Lumbar stenosis    OA (osteoarthritis)    PNA (pneumonia) 03/12/2021   Polyneuropathy in diabetes(357.2)    Prostate cancer (HCC) 2003   Restless legs    Ringing in ears    RLS (restless legs syndrome)    Sepsis (HCC) 03/13/2021   Type 2 diabetes mellitus (HCC)     Past Surgical History:  Procedure Laterality Date   ABDOMINAL EXPOSURE N/A 07/13/2019   Procedure: ABDOMINAL EXPOSURE;  Surgeon: Serene Gaile LELON, MD;  Location: [facility] OR;  Service: Vascular;  Laterality: N/A;  anterior approach   ANTERIOR LAT LUMBAR FUSION Right 07/13/2019   Procedure: Right Lumbar 3-4 Lumbar 4-5 Anterolateral lumbar interbody fusion;  Surgeon: Unice Pac, MD;  Location: [facility] OR;  Service: Neurosurgery;  Laterality: Right;   ANTERIOR LUMBAR FUSION N/A 07/13/2019   Procedure: Lumbar Five-Sacral One Anterior lumbar interbody fusion;  Surgeon: Unice Pac, MD;  Location: [facility] OR;  Service: Neurosurgery;  Laterality: N/A;  Anterior approach   APPLICATION OF INTRAOPERATIVE CT SCAN N/A 04/24/2021   Procedure: APPLICATION OF INTRAOPERATIVE CT SCAN;  Surgeon: Dawley, Lani BROCKS, DO;  Location: [facility] OR;  Service: Neurosurgery;  Laterality: N/A;  3C/RM 21   CHOLECYSTECTOMY     COLONOSCOPY     CORONARY STENT INTERVENTION N/A 10/01/2020   Procedure: CORONARY STENT INTERVENTION;  Surgeon: Elmira Newman PARAS, MD;  Location: [facility] INVASIVE CV LAB;  Service: Cardiovascular;  Laterality: N/A;   EYE SURGERY Bilateral    GALLBLADDER SURGERY     HEMORRHOID SURGERY  1983   LEFT HEART CATH AND CORONARY ANGIOGRAPHY N/A 09/17/2020   Procedure: LEFT HEART CATH AND CORONARY  ANGIOGRAPHY;  Surgeon: Elmira Newman PARAS, MD;  Location: [facility] INVASIVE CV LAB;  Service: Cardiovascular;  Laterality: N/A;   LUMBAR LAMINECTOMY/DECOMPRESSION MICRODISCECTOMY N/A 10/02/2016   Procedure: L3 to S1 Laminectomy;  Surgeon: Unice Pac, MD;  Location: [facility] OR;  Service: Neurosurgery;  Laterality: N/A;  L3 to S1 Laminectomy   LUMBAR LAMINECTOMY/DECOMPRESSION MICRODISCECTOMY Bilateral 09/07/2019   Procedure: Bilateral Lumbar Five - Sacral One Foraminotomy;  Surgeon: Unice Pac, MD;  Location: [facility] OR;  Service: Neurosurgery;  Laterality: Bilateral;  posterior   LUMBAR PERCUTANEOUS PEDICLE SCREW 3 LEVEL N/A 07/13/2019   Procedure: Percutaneous pedicle screw fixation from Lumbar 3 to Sacral 1;  Surgeon: Unice Pac, MD;  Location: [facility] OR;  Service: Neurosurgery;  Laterality: N/A;   MENISCUS REPAIR Right    PROSTATE SURGERY     SHOULDER SURGERY Left 01/2011   rotator cuff    Social History  reports that he has been smoking cigars. He has never used smokeless tobacco. He reports that he does not currently use alcohol. He reports that he does not use drugs.  Allergies  Allergen Reactions   Atorvastatin Other (See Comments)   Niacin     Other Reaction(s): break out in hives   Levaquin [Levofloxacin In D5w] Rash   Niaspan [Niacin Er (Antihyperlipidemic)] Rash and Other (See Comments)    Flushing reaction   Tramadol Rash    Family History  Problem Relation Age of Onset   Arthritis Mother    Hypertension Mother    Diabetes Mother    Heart attack Mother    Cancer Father   Reviewed on admission  Prior to Admission medications   Medication Sig Start Date End Date Taking? Authorizing Provider  ACCU-CHEK GUIDE TEST test strip  06/06/23   [provider]  acetaminophen  (TYLENOL ) 325 MG tablet Take 2 tablets (650 mg total) by mouth every 4 (four) hours as needed for mild pain (pain score 1-3) (or temp > 37.5 C (99.5 F)). 03/30/23   Angiulli, Toribio PARAS, PA-C  ascorbic acid   (VITAMIN C ) 500 MG tablet Take 1 tablet (500 mg total) by mouth daily. 03/31/23   Angiulli, Toribio PARAS, PA-C  aspirin  EC 81 MG tablet Take 1 tablet (81 mg total) by mouth daily. Swallow whole. 03/24/23   Remi Pippin, NP  Calcium  Carb-Cholecalciferol  (CALCIUM  1000 + D PO) Take by mouth.    [provider]  Continuous Glucose Sensor (FREESTYLE LIBRE 3 SENSOR) MISC Change sensor topically every 14 days to monitor glucose continously. 09/06/23     cyanocobalamin  1000 MCG tablet Take 1 tablet (1,000 mcg total) by mouth daily. 03/31/23   Angiulli, Toribio PARAS, PA-C  Dulaglutide  (TRULICITY ) 3 MG/0.5ML SOAJ Inject 3 mg into the skin once a week. Patient taking differently: Inject 3 mg into the skin once a week. Inject on Wednesday 02/03/23     DULoxetine  (CYMBALTA ) 30 MG capsule Take 1 capsule (30 mg total) by mouth daily. 03/31/23   Pegge Toribio PARAS, PA-C  ferrous sulfate  325 [phone]  FE) MG tablet Take 1 tablet (325 mg total) by mouth daily with breakfast. 03/31/23   Angiulli, Toribio PARAS, PA-C  gabapentin  (NEURONTIN ) 100 MG capsule Take 1 capsule (100 mg total) by mouth at bedtime. 03/31/23   Angiulli, Toribio PARAS, PA-C  insulin  glargine-yfgn (SEMGLEE) 100 UNIT/ML injection Inject 18 Units into the skin daily.    [provider]  metFORMIN  (GLUCOPHAGE ) 1000 MG tablet Take 1 tablet (1,000 mg total) by mouth 2 (two) times daily with a meal. 03/31/23   Angiulli, Toribio PARAS, PA-C  metFORMIN  (GLUCOPHAGE ) 1000 MG tablet 1 tablet with a meal Orally twice a day for 90 days    [provider]  midodrine  (PROAMATINE ) 5 MG tablet Take 1 tablet (5 mg total) by mouth 3 (three) times daily with meals. Will refill until I see him in clinic- but if he doesn't come to visit, cannot refill past 3 months 05/26/23   Lovorn, Megan, MD  midodrine  (PROAMATINE ) 5 MG tablet Take 1 tablet (5 mg total) by mouth 3 (three) times daily with meals. 08/02/23   Lovorn, Megan, MD  Multiple Vitamins-Minerals (MENS 50+ MULTI VITAMIN/MIN)  TABS Take 1 tablet by mouth daily.    [provider]  nitroGLYCERIN  (NITROSTAT ) 0.4 MG SL tablet Place 1 tablet (0.4 mg total) under the tongue every 5 (five) minutes as needed for chest pain. 09/02/20 03/23/23  Patwardhan, Newman PARAS, MD  pantoprazole  (PROTONIX ) 40 MG tablet Take 1 tablet (40 mg total) by mouth daily. 03/31/23   Angiulli, Toribio PARAS, PA-C  rOPINIRole  (REQUIP ) 2 MG tablet Take 2 tablets (4 mg total) by mouth at bedtime. 03/31/23   Angiulli, Toribio PARAS, PA-C  rosuvastatin  (CRESTOR ) 20 MG tablet Take 1 tablet (20 mg total) by mouth daily. 03/31/23   Angiulli, Toribio PARAS, PA-C  rosuvastatin  (CRESTOR ) 40 MG tablet TAKE 1 TABLET(40 MG) BY MOUTH DAILY 09/10/23   Patwardhan, Newman PARAS, MD  TRESIBA FLEXTOUCH 100 UNIT/ML FlexTouch Pen daily. Inject on Wednesday    [provider]    Physical Exam: Vitals:   10/15/23 1015 10/15/23 1045 10/15/23 1115 10/15/23 1130  BP: (!) 95/58 124/73 101/65 120/60  Pulse: 72 69 66 65  Resp: 18 20 (!) 21 17  Temp:      TempSrc:      SpO2: 100% 100% 100% 100%  Weight:      Height:        Physical Exam Constitutional:      General: He is not in acute distress.    Appearance: Normal appearance.  HENT:     Head: Normocephalic.     Comments: Bandage at nasal bridge    Mouth/Throat:     Mouth: Mucous membranes are moist.     Pharynx: Oropharynx is clear.  Eyes:     Extraocular Movements: Extraocular movements intact.     Pupils: Pupils are equal, round, and reactive to light.  Cardiovascular:     Rate and Rhythm: Normal rate and regular rhythm.     Pulses: Normal pulses.     Heart sounds: Normal heart sounds.  Pulmonary:     Effort: Pulmonary effort is normal. No respiratory distress.     Breath sounds: Normal breath sounds.  Abdominal:     General: Bowel sounds are normal. There is no distension.     Palpations: Abdomen is soft.     Tenderness: There is no abdominal tenderness.  Musculoskeletal:        General: No swelling or  deformity.  Skin:  General: Skin is warm and dry.  Neurological:     General: No focal deficit present.     Mental Status: Mental status is at baseline.    Labs on Admission: I have personally reviewed following labs and imaging studies  CBC: Recent Labs  Lab 10/15/23 0633  WBC 6.7  NEUTROABS 4.2  HGB 11.6*  HCT 35.4*  MCV 94.7  PLT 239    Basic Metabolic Panel: Recent Labs  Lab 10/15/23 0633  NA 138  K 4.2  CL 100  CO2 28  GLUCOSE 90  BUN 31*  CREATININE 1.66*  CALCIUM  9.3    GFR: Estimated Creatinine Clearance: 32.8 mL/min (A) (by C-G formula based on SCr of 1.66 mg/dL (H)).  Liver Function Tests: Recent Labs  Lab 10/15/23 0633  AST 19  ALT 15  ALKPHOS 60  BILITOT 0.9  PROT 6.6  ALBUMIN  3.5    Urine analysis:    Component Value Date/Time   COLORURINE YELLOW 10/15/2023 1209   APPEARANCEUR CLEAR 10/15/2023 1209   LABSPEC >1.046 (H) 10/15/2023 1209   PHURINE 5.0 10/15/2023 1209   GLUCOSEU NEGATIVE 10/15/2023 1209   HGBUR NEGATIVE 10/15/2023 1209   BILIRUBINUR NEGATIVE 10/15/2023 1209   KETONESUR NEGATIVE 10/15/2023 1209   PROTEINUR NEGATIVE 10/15/2023 1209   NITRITE NEGATIVE 10/15/2023 1209   LEUKOCYTESUR NEGATIVE 10/15/2023 1209    Radiological Exams on Admission: CT CHEST ABDOMEN PELVIS W CONTRAST Result Date: 10/15/2023 CLINICAL DATA:  83 year old male with multiple recent falls. Weakness. EXAM: CT CHEST, ABDOMEN, AND PELVIS WITH CONTRAST TECHNIQUE: Multidetector CT imaging of the chest, abdomen and pelvis was performed following the standard protocol during bolus administration of intravenous contrast. RADIATION DOSE REDUCTION: This exam was performed according to the departmental dose-optimization program which includes automated exposure control, adjustment of the mA and/or kV according to patient size and/or use of iterative reconstruction technique. CONTRAST:  75mL OMNIPAQUE  IOHEXOL  350 MG/ML SOLN COMPARISON:  Chest CT 05/27/2023.  CT  Abdomen and Pelvis 03/15/2023. FINDINGS: CT CHEST FINDINGS Cardiovascular: Calcified aortic atherosclerosis. Calcified coronary artery atherosclerosis is extensive. Heart size remains normal. No pericardial effusion. No mediastinal vascular injury identified. Mediastinum/Nodes: Negative for mediastinal hematoma, mass, lymphadenopathy. Lungs/Pleura: Trace retained secretions in the trachea, carina, and mild secretions in the left mainstem bronchus. But otherwise the major airways are patent. Somewhat hyperinflated lungs, some background evidence of chronic pulmonary emphysema. Chronic lower lobe and especially costophrenic angle subpleural lung scarring. No pneumothorax, pleural effusion, pulmonary contusion. Scattered small calcified granulomas in the lungs, benign. No acute or suspicious lung nodule. Musculoskeletal: Thoracic hyperostosis resulting in some levels of thoracic interbody ankylosis from T4 through T10. Stable thoracic spine. No acute osseous abnormality identified. CT ABDOMEN PELVIS FINDINGS Hepatobiliary: Chronic cholecystectomy. Liver appears intact. No perihepatic fluid. Pancreas: Atrophied. Spleen: Spleen appears intact.  No perisplenic fluid. Adrenals/Urinary Tract: Normal adrenal glands. Normal renal enhancement and early contrast excretion. Diminutive ureters. Diminutive and unremarkable bladder. Stomach/Bowel: Similar large bowel retained stool and redundancy. More retained gas than stool in the transverse colon now. No large bowel inflammation. Decompressed terminal ileum. Cecum on a lax mesentery. Diminutive and normal appendix on coronal image 37. No dilated small bowel. Mild gas and fluid in the stomach. Negative duodenum. No pneumoperitoneum, free fluid, mesenteric inflammation identified. Vascular/Lymphatic: Extensive Aortoiliac calcified atherosclerosis. Normal caliber abdominal aorta. Major arterial structures in the abdomen and pelvis are patent. Patent portal venous system on the  delayed images. No lymphadenopathy identified. Reproductive: Chronic prostate brachytherapy. Other: No pelvis free fluid. Musculoskeletal:  Chronic lumbar spine degeneration superimposed on widespread chronic lumbar spinal fusion, multilevel decompression. Stable CT appearance since last year. Chronic bilateral iliac cortical bone screws. Sacrum, SI joints, pelvis, and proximal femurs appear stable and intact. IMPRESSION: No acute traumatic injury identified in the Chest, Abdomen, or Pelvis. Aortic Atherosclerosis (ICD10-I70.0). Emphysema (ICD10-J43.9). Chronic spine surgery and degeneration. Electronically Signed   By: VEAR Hurst M.D.   On: 10/15/2023 08:45   CT Cervical Spine Wo Contrast Result Date: 10/15/2023 CLINICAL DATA:  83 year old male with multiple recent falls. Weakness. EXAM: CT CERVICAL SPINE WITHOUT CONTRAST TECHNIQUE: Multidetector CT imaging of the cervical spine was performed without intravenous contrast. Multiplanar CT image reconstructions were also generated. RADIATION DOSE REDUCTION: This exam was performed according to the departmental dose-optimization program which includes automated exposure control, adjustment of the mA and/or kV according to patient size and/or use of iterative reconstruction technique. COMPARISON:  Recent cervical spine CT 10/06/2023. Head and face CT today.  Face CT and cervical spine CT 02/21/2023. Cervical spine MRI 03/18/2023. FINDINGS: Alignment: Chronic straightening and mild reversal of cervical lordosis is stable from last year. Cervicothoracic junction alignment is within normal limits. Bilateral posterior element alignment is within normal limits. Skull base and vertebrae: Bone mineralization is within normal limits for age. Visualized skull base is intact. No atlanto-occipital dissociation. C1 and C2 appear intact and aligned. Advanced cervical spine degeneration, detailed below. Nonacute nondisplaced fracture of left C4 superior articulating facet degenerative  osteophyte (series 10, image 39), new from last year but stable from the recent CT, age indeterminate and appears inconsequential. No acute osseous abnormality identified. Soft tissues and spinal canal: No prevertebral fluid or swelling. No visible canal hematoma. Stable noncontrast neck soft tissues with calcified atherosclerosis and blood pool hypodensity. Disc levels: Chronically advanced cervical spine degeneration superimposed on degenerative C2-C3 facet ankylosis on the left. Additional mid and upper cervical facet arthropathy, including left C4 level with discontinuous osteophyte as detailed above. Associated chronic degenerative anterolisthesis of C3 on C4. And CT evidence of chronic multilevel cervical spinal stenosis, stable from last year. See also cervical spine MRI 03/18/2023. Upper chest: Chest CT today reported separately. Negative visible noncontrast thoracic inlet. IMPRESSION: 1. No acute traumatic injury identified in the cervical spine. 2. Chronically advanced cervical spine degeneration. Chronic facet arthropathy, and multilevel spinal stenosis as demonstrated on cervical spine MRI 03/18/2023 (please see that report). 3. Chronic blood pool hypodensity in the neck, query Anemia. Calcified carotid atherosclerosis. Electronically Signed   By: VEAR Hurst M.D.   On: 10/15/2023 08:36   CT Maxillofacial Wo Contrast Result Date: 10/15/2023 CLINICAL DATA:  83 year old male with multiple recent falls. Weakness. EXAM: CT MAXILLOFACIAL WITHOUT CONTRAST TECHNIQUE: Multidetector CT imaging of the maxillofacial structures was performed. Multiplanar CT image reconstructions were also generated. RADIATION DOSE REDUCTION: This exam was performed according to the departmental dose-optimization program which includes automated exposure control, adjustment of the mA and/or kV according to patient size and/or use of iterative reconstruction technique. COMPARISON:  CT head and cervical spine today.  Face CT 02/21/2023.  FINDINGS: Osseous: Mandible intact and normally located. Mild progression of carious dentition since last year. Bilateral maxilla, zygoma, pterygoid, nasal bones appear stable and intact. Central skull base and cervical vertebrae appear aligned. Cervical spine detailed separately. Orbits: Intact orbital walls. Postoperative changes to the globes, orbits soft tissues appear stable and negative. Sinuses: Clear. Soft tissues: Negative visible noncontrast larynx, pharynx, parapharyngeal spaces, retropharyngeal space, sublingual space, submandibular glands, masticator and parotid spaces. Calcified carotid atherosclerosis  and blood pool hypodensity is chronic. Limited intracranial: Calcified atherosclerosis at the skull base. Stable brain to that the tail separately today. IMPRESSION: No acute traumatic injury identified in the Face. Electronically Signed   By: VEAR Hurst M.D.   On: 10/15/2023 08:32   CT Head Wo Contrast Result Date: 10/15/2023 CLINICAL DATA:  83 year old male with multiple recent falls. Weakness. EXAM: CT HEAD WITHOUT CONTRAST TECHNIQUE: Contiguous axial images were obtained from the base of the skull through the vertex without intravenous contrast. RADIATION DOSE REDUCTION: This exam was performed according to the departmental dose-optimization program which includes automated exposure control, adjustment of the mA and/or kV according to patient size and/or use of iterative reconstruction technique. COMPARISON:  Face and cervical spine CT today reported separately. Head CT 10/06/2023. Brain MRI 03/14/2023. FINDINGS: Brain: Stable cerebral volume from last year. No midline shift, ventriculomegaly, mass effect, evidence of mass lesion, intracranial hemorrhage or evidence of cortically based acute infarction. Stable gray-white differentiation, basal ganglia vascular calcifications. Vascular: Calcified atherosclerosis at the skull base. No suspicious intracranial vascular hyperdensity. Skull: Stable and  intact. Sinuses/Orbits: Visualized paranasal sinuses and mastoids are stable and well aerated. Other: Broad-based posterior convexity, occipital scalp hematoma has regressed. No new orbit or scalp soft tissue abnormality identified. IMPRESSION: 1. No acute intracranial abnormality or acute traumatic injury identified. 2. Regressed posterior scalp hematoma since 10/06/2023. Electronically Signed   By: VEAR Hurst M.D.   On: 10/15/2023 08:29   EKG: Independently reviewed.  Sinus rhythm at 66 bpm.  Nonspecific T wave changes.  Baseline artifact in 1, 2, aVR, aVL, aVF.  Assessment/Plan Active Problems:   Diabetic polyneuropathy (HCC)   Spinal stenosis of lumbar region with neurogenic claudication   PAD (peripheral artery disease) (HCC)   Type 2 diabetes mellitus (HCC)   Coronary artery disease involving native coronary artery of native heart without angina pectoris   Restless legs   Orthostatic hypotension   Chronic low back pain   Essential hypertension   Hyperlipidemia   Interstitial lung disease (HCC)   Major depressive disorder, recurrent, moderate (HCC)   Steatosis of liver   AKI on CKD3a > Creatinine elevated to 1.66 from baseline 1. > Likely contributing to his worsening orthostatic symptoms as below. > Received 1.5 L IV fluid in the ED. - Monitor on telemetry overnight - Continue with IV fluids - Trend renal function and electrolytes  Falls Orthostatic hypotension > History of prior syncope and orthostasis.  Likely exacerbated with AKI as above. > Has diabetes with neuropathy there could be a component of diabetic autonomic neuropathy as well. > Is already on midodrine  which has been resumed in the ED. - IV fluids as above - Continue home midodrine  - Abdominal binder  Hypertension - No longer has issues with this  Hyperlipidemia - Continue rosuvastatin   CAD PAD Carotid artery disease - Continue home rosuvastatin , ASA  Anemia - Hemoglobin stable 11.6, trend  CBC  Neuropathy - Takes Lyrica  on Wednesdays  RLS - Continue home pramipexole  History of prostate cancer - Noted  ILD - History of this with new medications  L-spine disease Chronic pain - Noted  DVT prophylaxis: Lovenox  Code Status:   Full Family Communication:  Updated at bedside  Disposition Plan:   Patient is from:  Home  Anticipated DC to:  Home  Anticipated DC date:  1 to 3 days  Anticipated DC barriers: None  Consults called:  None Admission status:  Observation, telemetry  Severity of Illness: The appropriate patient  status for this patient is OBSERVATION. Observation status is judged to be reasonable and necessary in order to provide the required intensity of service to ensure the patient's safety. The patient's presenting symptoms, physical exam findings, and initial radiographic and laboratory data in the context of their medical condition is felt to place them at decreased risk for further clinical deterioration. Furthermore, it is anticipated that the patient will be medically stable for discharge from the hospital within 2 midnights of admission.    Marsa KATHEE Scurry MD [facility]  How to contact the [facility] Attending or Consulting provider 7A - 7P or covering provider during after hours 7P -7A, for this patient?   Check the care team in [facility] and look for a) attending/consulting [facility] provider listed and b) the [facility] team listed Log into [email or web address] and use [facility]'s universal password to access. If you do not have the password, please contact the hospital operator. Locate the [facility] provider you are looking for under [facility] and page to a number that you can be directly reached. If you still have difficulty reaching the provider, please page the [facility] (Director on Call) for the Hospitalists listed on amion for assistance.  10/15/2023, 1:09 PM

## 2023-10-15 NOTE — ED Provider Notes (Signed)
  Physical Exam  BP 120/60   Pulse 65   Temp (!) 97.4 F (36.3 C) (Oral)   Resp 17   Ht 6' 2 (1.88 m)   Wt 69.9 kg   SpO2 100%   BMI 19.77 kg/m   Physical Exam  Procedures  Procedures  ED Course / MDM    Medical Decision Making Amount and/or Complexity of Data Reviewed Labs: ordered. Radiology: ordered. ECG/medicine tests: ordered.  Risk Prescription drug management. Decision regarding hospitalization.   Presents with falls, weakness, weight loss.  Patient recently diagnosed with esophageal dysmotility.  He has had couple falls here recently that sounds more orthostatic in nature.  Inpatient transitional care order has been placed.  DC order for face-to-face physical therapy has been ordered as well.   Pending CT imaging because of traumatic fall.   CT imaging was unremarkable.  Social work and physical therapy was consulted and help with dispo home given multiple falls.   Patient orthostatic positive.  Systolic with drop into the 60s to 70s upon standing.  Patient received a total of 1.5 L of fluid as well as his midodrine .  Repeated orthostatics, continue to remain hypotensive and symptomatic when standing.  Systolics remain in the 60s and 70s and patient felt very lightheaded.   Given this, as well as his multiple falls, and mild AKI, patient was admitted for further evaluation.      Simon Lavonia SAILOR, MD 10/15/23 [phone]

## 2023-10-15 NOTE — ED Triage Notes (Signed)
 Pt BIB [facility] from home. Pt had about two falls in the past 20 min. He reports he had some weakness in his legs and they just gave out. He fell on backwards and hit his head and then fell forward. EMS notes a contusion on nose. No LOC, no blood thinners. Pt endorses pain to the back of his neck. GCS 15  EMS 80-100SBP 109cbg 72P 18 97%RA 20LAC

## 2023-10-15 NOTE — Progress Notes (Signed)
 Physical Therapy Evaluation Patient Details Name: Harry Zamora MRN: [id number] DOB: May 25, 1940 Today's Date: 10/15/2023  History of Present Illness  Harry Zamora is a 83 y.o. male who presented to [facility] ED 10/15/23 s/p two falls within 20 minutes. Pt reports his legs gave out and he hit his head. Imaging negative for acute traumatic injury. No PMHx on file.  Clinical Impression  Pt admitted with above diagnosis. PTA, pt was modI for functional mobility using cane or rollator, independent with most ADLs, and required assistance with all IADLs. He lives with his wife in a one story house with a level entry. Pt currently with functional limitations due to the deficits listed below (see PT Problem List). He performed bed mobility with supervision and transfers using RW with CGA. Pt fatigued in standing with BLE shakiness noted at 2 minutes. Deferred ambulation d/t symptomatic orthostatic hypotension. Pt will benefit from acute skilled PT to increase his independence and safety with mobility to allow discharge. Recommend HHPT to increased strength, improve balance, decrease fall risk, and optimize safety within the home environment.     10/15/23 1048  Vitals  Patient Position (if appropriate) Orthostatic Vitals  Orthostatic Lying   BP- Lying 124/73 (MAP 89)  Pulse- Lying 69  Orthostatic Sitting  BP- Sitting (!) 73/45 (MAP 53)  Pulse- Sitting 71  Orthostatic Standing at 0 minutes  BP- Standing at 0 minutes (!) 63/44 (MAP 51)  Pulse- Standing at 0 minutes 81  Orthostatic Standing at 3 minutes  BP- Standing at 3 minutes  (NT - pt unable to maintain static stance d/t fatigue, weakness, and onset of headache. He denies dizziness/lightheadedness.)          If plan is discharge home, recommend the following: A little help with walking and/or transfers;A little help with bathing/dressing/bathroom;Assistance with cooking/housework;Assist for transportation;Help with stairs or ramp for entrance   Can  travel by private vehicle        Equipment Recommendations None recommended by PT (Pt already has DME)  Recommendations for Other Services       Functional Status Assessment Patient has had a recent decline in their functional status and demonstrates the ability to make significant improvements in function in a reasonable and predictable amount of time.     Precautions / Restrictions Precautions Precautions: Fall Recall of Precautions/Restrictions: Impaired Restrictions Weight Bearing Restrictions Per Provider Order: No      Mobility  Bed Mobility Overal bed mobility: Needs Assistance Bed Mobility: Supine to Sit, Sit to Supine     Supine to sit: Supervision, HOB elevated, Used rails Sit to supine: Supervision, HOB elevated   General bed mobility comments: Pt sat up on R side of bed with increased time. HOB slightly raised and use of bedrail.    Transfers Overall transfer level: Needs assistance Equipment used: Rolling walker (2 wheels) Transfers: Sit to/from Stand Sit to Stand: Contact guard assist           General transfer comment: Pt stood from stretcher. Cued proper hand placement using RW. Powered up with CGA for safety. Good eccentric control.    Ambulation/Gait               General Gait Details: Deferred d/t symptomatic OH  Stairs            Wheelchair Mobility     Tilt Bed    Modified Rankin (Stroke Patients Only)       Balance Overall balance assessment: History of Falls, Needs assistance  Sitting-balance support: Feet supported, No upper extremity supported Sitting balance-Leahy Scale: Good     Standing balance support: Bilateral upper extremity supported, During functional activity, Reliant on assistive device for balance Standing balance-Leahy Scale: Poor Standing balance comment: Pt dependent on RW                             Pertinent Vitals/Pain Pain Assessment Pain Assessment: No/denies pain    Home  Living Family/patient expects to be discharged to:: Private residence Living Arrangements: Spouse/significant other Available Help at Discharge: Family;Available 24 hours/day Type of Home: House Home Access: Level entry       Home Layout: One level Home Equipment: Cane - single point;Rollator (4 wheels);Shower seat;Toilet riser;Grab bars - tub/shower;Other (comment);Standard Walker;Cane - quad (suction cup grab bars)      Prior Function Prior Level of Function : Independent/Modified Independent;History of Falls (last six months);Needs assist       Physical Assist : ADLs (physical)   ADLs (physical): Bathing;IADLs Mobility Comments: Ambulates with SPC or rollator. Wife advises him on which AD to use. Multiple falls, feels like his legs give out on him. ADLs Comments: Indep to ModI with most ADLs. Wife assists with bathing and managed all IADLs. He is not able to drive.     Extremity/Trunk Assessment   Upper Extremity Assessment Upper Extremity Assessment: Right hand dominant;RUE deficits/detail;LUE deficits/detail RUE Deficits / Details: Hx of RTC tear. Pt with limited shoulder AROM d/t pain. Grossly 3/5 shoulder strength, 3+/5 elbow strength, and 3-/5 grip strength. RUE: Shoulder pain with ROM RUE Sensation: WNL RUE Coordination: decreased fine motor;decreased gross motor LUE Deficits / Details: AROM WFL. Grossly 4-/5 strength LUE Sensation: WNL LUE Coordination: decreased fine motor;decreased gross motor    Lower Extremity Assessment Lower Extremity Assessment: RLE deficits/detail;LLE deficits/detail RLE Deficits / Details: Hip flex 3+/5, Knee flex/ext 4/5, Ankle DF 3+/5 RLE Sensation: WNL RLE Coordination: decreased gross motor LLE Deficits / Details: Hip flex 3+/5, Knee flex/ext 4/5, Ankle DF 3+/5 LLE Sensation: WNL LLE Coordination: decreased gross motor    Cervical / Trunk Assessment Cervical / Trunk Assessment: Kyphotic  Communication    Communication Communication: No apparent difficulties    Cognition Arousal: Alert Behavior During Therapy: WFL for tasks assessed/performed   PT - Cognitive impairments: No apparent impairments                       PT - Cognition Comments: Pt A,Ox4 Following commands: Intact       Cueing Cueing Techniques: Verbal cues     General Comments General comments (skin integrity, edema, etc.): Orthostatic Vitals taken. Pt with scrap along bridge of nose.    Exercises     Assessment/Plan    PT Assessment Patient needs continued PT services  PT Problem List Decreased strength;Decreased activity tolerance;Decreased balance;Decreased mobility;Decreased knowledge of use of DME;Decreased safety awareness       PT Treatment Interventions DME instruction;Gait training;Functional mobility training;Therapeutic activities;Therapeutic exercise;Neuromuscular re-education    PT Goals (Current goals can be found in the Care Plan section)  Acute Rehab PT Goals Patient Stated Goal: Return Home and stop falling PT Goal Formulation: With patient/family Time For Goal Achievement: 10/29/23 Potential to Achieve Goals: Good    Frequency Min 2X/week     Co-evaluation               AM-PAC PT 6 Clicks Mobility  Outcome Measure Help needed turning from  your back to your side while in a flat bed without using bedrails?: A Little Help needed moving from lying on your back to sitting on the side of a flat bed without using bedrails?: A Little Help needed moving to and from a bed to a chair (including a wheelchair)?: A Little Help needed standing up from a chair using your arms (e.g., wheelchair or bedside chair)?: A Little Help needed to walk in hospital room?: A Lot Help needed climbing 3-5 steps with a railing? : A Lot 6 Click Score: 16    End of Session Equipment Utilized During Treatment: Gait belt Activity Tolerance: Patient tolerated treatment well;Patient limited by  fatigue Patient left: in bed;with call bell/phone within reach;with family/visitor present Nurse Communication: Mobility status;Other (comment) (BP response) PT Visit Diagnosis: Difficulty in walking, not elsewhere classified (R26.2);Unsteadiness on feet (R26.81);History of falling (Z91.81);Repeated falls (R29.6)    Time: 8957-8891 PT Time Calculation (min) (ACUTE ONLY): 26 min   Charges:   PT Evaluation $PT Eval Moderate Complexity: 1 Mod PT Treatments $Therapeutic Activity: 8-22 mins PT General Charges $$ ACUTE PT VISIT: 1 Visit         Randall SAUNDERS, PT, DPT Acute Rehabilitation Services Office: [phone] Secure Chat Preferred  Delon CHRISTELLA Callander 10/15/2023, 11:27 AM

## 2023-10-15 NOTE — ED Notes (Signed)
 EDP Lemly aware pt positive orthostatics. Pt began shaking and knees began to buckle when going from sitting to standing. Pt also became pale. Pt unable to stand long enough to get 3 min BP.

## 2023-10-15 NOTE — Progress Notes (Signed)
 New Admission Note:   Arrival Method: stretcher Mental Orientation: aa+ox4 Telemetry: box 12 Assessment: Completed Skin: abrasion IV: SL Pain: denies Tubes: n/a Safety Measures: Safety Fall Prevention Plan has been given, discussed and signed Admission: Completed [facility] Orientation: Patient has been orientated to the room, unit and staff.  Family: present  Orders have been reviewed and implemented. Will continue to monitor the patient. Call light has been placed within reach and bed alarm has been activated.   Doyal Sias, RN

## 2023-10-15 NOTE — ED Notes (Signed)
 Pt provided with food and drink while awaiting PT

## 2023-10-15 NOTE — Progress Notes (Signed)
 RN noted pt unsteady on feet while ambulating with assistance to bathroom.   Pt educated on high falls risk safety plan:   Nonskid socks, yellow armband, bed/chair alarm at all times and to call for assistance. Pt agreeable to plan.

## 2023-10-15 NOTE — ED Notes (Signed)
 Pt at bedside

## 2023-10-15 NOTE — Progress Notes (Signed)
 Transition of Care [facility]) - Emergency Department Mini Assessment   Patient Details  Name: Harry Zamora MRN: [id number] Date of Birth: March 18, 1940  Transition of Care [facility]) CM/SW Contact:    Debarah Saunas, RN Phone Number: 10/15/2023, 10:15 AM   Clinical Narrative: Saunas PARAS. Debarah, BSN, RN, UTAH [phone] RNCM spoke with pt and spouse at bedside regarding discharge planning for Home Health Services. Offered pt medicare.gov list of home health agencies to choose from.  Pt chose [facility] to render PT services. [facility] [place] notified. Patient made aware that [facility] will be in contact in 24-48 hours.  No DME needs identified at this time.    ED Mini Assessment: What brought you to the Emergency Department? 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   Constipation 03/28/2023   Spinal cord contusion, cervical region 03/23/2023   Contusion of cervical cord, initial encounter (HCC) 03/23/2023   Orthostatic hypotension 11/18/2022   Gait abnormality 11/18/2022   Peripheral neuropathy 11/18/2022   Confusion 11/18/2022   Unilateral primary osteoarthritis, left knee 10/01/2021   Postural dizziness with presyncope 08/04/2021   Lumbar spinal stenosis 04/24/2021   Sepsis (HCC) 03/13/2021   AKI (acute kidney injury) (HCC) 03/13/2021   Elevated CK 03/13/2021   Severe sepsis (HCC) 03/13/2021   PNA (pneumonia) 03/12/2021   Restless legs    Acute respiratory failure with hypoxia (HCC)    Coronary artery disease  involving native coronary artery of native heart without angina pectoris 09/19/2020   Abnormal stress test 09/16/2020   Hyperkalemia 09/12/2020   Acute renal failure 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) 09/12/2020   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   Type 2 diabetes mellitus (HCC)    Syncope and collapse 08/07/2020   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   PAD (peripheral artery disease) (HCC) 08/30/2019   Spondylolisthesis of lumbar region 07/13/2019   Spinal stenosis of lumbar region with neurogenic claudication 10/02/2016   Diabetic polyneuropathy (HCC) 11/10/2013   PCP:  Nichole Senior, MD Pharmacy:   [id number] [place], [place] - [address] AT [facility] [address] [facility] [address] [place] [address] Phone: [phone] Fax: [phone]  [facility] [address] [place] [address] Phone: [phone] Fax: [phone]  [facility] [address] [place] [address] Phone: [phone] Fax: [phone]

## 2023-10-15 NOTE — ED Provider Notes (Incomplete)
 [place] EMERGENCY DEPARTMENT AT [facility] Provider Note   CSN: [id number] Arrival date & time: 10/15/23  0536     Patient presents with: Harry   JABORI Zamora is a 83 y.o. male.  {Add pertinent medical, surgical, social history, OB history to YEP:[id number]}  Fall       Prior to Admission medications   Medication Sig Start Date End Date Taking? Authorizing Provider  ACCU-CHEK GUIDE TEST test strip  06/06/23   [provider]  acetaminophen  (TYLENOL ) 325 MG tablet Take 2 tablets (650 mg total) by mouth every 4 (four) hours as needed for mild pain (pain score 1-3) (or temp > 37.5 C (99.5 F)). 03/30/23   Angiulli, Toribio PARAS, PA-C  ascorbic acid  (VITAMIN C ) 500 MG tablet Take 1 tablet (500 mg total) by mouth daily. 03/31/23   Angiulli, Toribio PARAS, PA-C  aspirin  EC 81 MG tablet Take 1 tablet (81 mg total) by mouth daily. Swallow whole. 03/24/23   Remi Pippin, NP  Calcium  Carb-Cholecalciferol  (CALCIUM  1000 + D PO) Take by mouth.    [provider]  Continuous Glucose Sensor (FREESTYLE LIBRE 3 SENSOR) MISC Change sensor topically every 14 days to monitor glucose continously. 09/06/23     cyanocobalamin  1000 MCG tablet Take 1 tablet (1,000 mcg total) by mouth daily. 03/31/23   Angiulli, Toribio PARAS, PA-C  Dulaglutide  (TRULICITY ) 3 MG/0.5ML SOAJ Inject 3 mg into the skin once a week. Patient taking differently: Inject 3 mg into the skin once a week. Inject on Wednesday 02/03/23     DULoxetine  (CYMBALTA ) 30 MG capsule Take 1 capsule (30 mg total) by mouth daily. 03/31/23   Angiulli, Toribio PARAS, PA-C  ferrous sulfate  325 (65 FE) MG tablet Take 1 tablet (325 mg total) by mouth daily with breakfast. 03/31/23   Angiulli, Toribio PARAS, PA-C  gabapentin  (NEURONTIN ) 100 MG capsule Take 1 capsule (100 mg total) by mouth at bedtime. 03/31/23   Angiulli, Toribio PARAS, PA-C  insulin  glargine-yfgn (SEMGLEE) 100 UNIT/ML injection Inject 18 Units into the skin daily.    [provider]   metFORMIN  (GLUCOPHAGE ) 1000 MG tablet Take 1 tablet (1,000 mg total) by mouth 2 (two) times daily with a meal. 03/31/23   Angiulli, Toribio PARAS, PA-C  metFORMIN  (GLUCOPHAGE ) 1000 MG tablet 1 tablet with a meal Orally twice a day for 90 days    [provider]  midodrine  (PROAMATINE ) 5 MG tablet Take 1 tablet (5 mg total) by mouth 3 (three) times daily with meals. Will refill until I see him in clinic- but if he doesn't come to visit, cannot refill past 3 months 05/26/23   Lovorn, Megan, MD  midodrine  (PROAMATINE ) 5 MG tablet Take 1 tablet (5 mg total) by mouth 3 (three) times daily with meals. 08/02/23   Lovorn, Megan, MD  Multiple Vitamins-Minerals (MENS 50+ MULTI VITAMIN/MIN) TABS Take 1 tablet by mouth daily.    [provider]  nitroGLYCERIN  (NITROSTAT ) 0.4 MG SL tablet Place 1 tablet (0.4 mg total) under the tongue every 5 (five) minutes as needed for chest pain. 09/02/20 03/23/23  Patwardhan, Newman PARAS, MD  pantoprazole  (PROTONIX ) 40 MG tablet Take 1 tablet (40 mg total) by mouth daily. 03/31/23   Angiulli, Toribio PARAS, PA-C  rOPINIRole  (REQUIP ) 2 MG tablet Take 2 tablets (4 mg total) by mouth at bedtime. 03/31/23   Angiulli, Toribio PARAS, PA-C  rosuvastatin  (CRESTOR ) 20 MG tablet Take 1 tablet (20 mg total) by mouth daily. 03/31/23   Angiulli,  Toribio PARAS, PA-C  rosuvastatin  (CRESTOR ) 40 MG tablet TAKE 1 TABLET(40 MG) BY MOUTH DAILY 09/10/23   Patwardhan, Newman PARAS, MD  TRESIBA FLEXTOUCH 100 UNIT/ML FlexTouch Pen daily. Inject on Wednesday    [provider]    Allergies: Atorvastatin, Niacin, Levaquin [levofloxacin in d5w], Niaspan [niacin er (antihyperlipidemic)], and Tramadol    Review of Systems  Updated Vital Signs BP 117/62   Pulse 66   Temp (!) 96.4 F (35.8 C) (Axillary)   Resp (!) 23   Ht 6' 2 (1.88 m)   Wt 69.9 kg   SpO2 100%   BMI 19.77 kg/m   Physical Exam  (all labs ordered are listed, but only abnormal results are displayed) Labs Reviewed  CBC WITH  DIFFERENTIAL/PLATELET - Abnormal; Notable for the following components:      Result Value   RBC 3.74 (*)    Hemoglobin 11.6 (*)    HCT 35.4 (*)    All other components within normal limits  COMPREHENSIVE METABOLIC PANEL WITH GFR - Abnormal; Notable for the following components:   BUN 31 (*)    Creatinine, Ser 1.66 (*)    GFR, Estimated 40 (*)    All other components within normal limits  URINALYSIS, ROUTINE W REFLEX MICROSCOPIC    EKG: None  Radiology: No results found.  {Document cardiac monitor, telemetry assessment procedure when appropriate:32947} Procedures   Medications Ordered in the ED  lactated ringers  bolus 1,000 mL (1,000 mLs Intravenous New Bag/Given 10/15/23 9367)      {Click here for ABCD2, HEART and other calculators REFRESH Note before signing:1}                              Medical Decision Making Amount and/or Complexity of Data Reviewed Labs: ordered. Radiology: ordered. ECG/medicine tests: ordered.   ***  {Document critical care time when appropriate  Document review of labs and clinical decision tools ie CHADS2VASC2, etc  Document your independent review of radiology images and any outside records  Document your discussion with family members, caretakers and with consultants  Document social determinants of health affecting pt's care  Document your decision making why or why not admission, treatments were needed:32947:::1}   Final diagnoses:  None    ED Discharge Orders          Ordered    Home Health        10/15/23 [phone]    Face-to-face encounter (required for Medicare/Medicaid patients)       Comments: I Selinda Sias certify that this patient is under my care and that I, or a nurse practitioner or physician's assistant working with me, had a face-to-face encounter that meets the physician face-to-face encounter requirements with this patient on 10/15/2023. The encounter with the patient was in whole, or in part for the following medical  condition(s) which is the primary reason for home health care (List medical condition): malnourishment, weakness. Further orders per PCP.   10/15/23 9292

## 2023-10-15 NOTE — ED Notes (Signed)
 Patient transported to CT

## 2023-10-15 NOTE — Plan of Care (Signed)

## 2023-10-16 DIAGNOSIS — G2581 Restless legs syndrome: Secondary | ICD-10-CM | POA: Diagnosis present

## 2023-10-16 DIAGNOSIS — E861 Hypovolemia: Secondary | ICD-10-CM | POA: Diagnosis present

## 2023-10-16 DIAGNOSIS — R42 Dizziness and giddiness: Secondary | ICD-10-CM | POA: Diagnosis not present

## 2023-10-16 DIAGNOSIS — K760 Fatty (change of) liver, not elsewhere classified: Secondary | ICD-10-CM | POA: Diagnosis present

## 2023-10-16 DIAGNOSIS — Z955 Presence of coronary angioplasty implant and graft: Secondary | ICD-10-CM | POA: Diagnosis not present

## 2023-10-16 DIAGNOSIS — F331 Major depressive disorder, recurrent, moderate: Secondary | ICD-10-CM | POA: Diagnosis present

## 2023-10-16 DIAGNOSIS — D631 Anemia in chronic kidney disease: Secondary | ICD-10-CM | POA: Diagnosis present

## 2023-10-16 DIAGNOSIS — W19XXXA Unspecified fall, initial encounter: Secondary | ICD-10-CM

## 2023-10-16 DIAGNOSIS — J849 Interstitial pulmonary disease, unspecified: Secondary | ICD-10-CM | POA: Diagnosis present

## 2023-10-16 DIAGNOSIS — Z7984 Long term (current) use of oral hypoglycemic drugs: Secondary | ICD-10-CM | POA: Diagnosis not present

## 2023-10-16 DIAGNOSIS — I700 Atherosclerosis of aorta: Secondary | ICD-10-CM | POA: Diagnosis present

## 2023-10-16 DIAGNOSIS — E1151 Type 2 diabetes mellitus with diabetic peripheral angiopathy without gangrene: Secondary | ICD-10-CM | POA: Diagnosis present

## 2023-10-16 DIAGNOSIS — Z7985 Long-term (current) use of injectable non-insulin antidiabetic drugs: Secondary | ICD-10-CM | POA: Diagnosis not present

## 2023-10-16 DIAGNOSIS — E781 Pure hyperglyceridemia: Secondary | ICD-10-CM | POA: Diagnosis present

## 2023-10-16 DIAGNOSIS — Z8249 Family history of ischemic heart disease and other diseases of the circulatory system: Secondary | ICD-10-CM | POA: Diagnosis not present

## 2023-10-16 DIAGNOSIS — E1142 Type 2 diabetes mellitus with diabetic polyneuropathy: Secondary | ICD-10-CM | POA: Diagnosis present

## 2023-10-16 DIAGNOSIS — I2510 Atherosclerotic heart disease of native coronary artery without angina pectoris: Secondary | ICD-10-CM | POA: Diagnosis present

## 2023-10-16 DIAGNOSIS — R0989 Other specified symptoms and signs involving the circulatory and respiratory systems: Secondary | ICD-10-CM | POA: Diagnosis present

## 2023-10-16 DIAGNOSIS — E1122 Type 2 diabetes mellitus with diabetic chronic kidney disease: Secondary | ICD-10-CM | POA: Diagnosis present

## 2023-10-16 DIAGNOSIS — G8929 Other chronic pain: Secondary | ICD-10-CM | POA: Diagnosis present

## 2023-10-16 DIAGNOSIS — I129 Hypertensive chronic kidney disease with stage 1 through stage 4 chronic kidney disease, or unspecified chronic kidney disease: Secondary | ICD-10-CM | POA: Diagnosis present

## 2023-10-16 DIAGNOSIS — R531 Weakness: Secondary | ICD-10-CM | POA: Diagnosis present

## 2023-10-16 DIAGNOSIS — M48062 Spinal stenosis, lumbar region with neurogenic claudication: Secondary | ICD-10-CM | POA: Diagnosis present

## 2023-10-16 DIAGNOSIS — I951 Orthostatic hypotension: Secondary | ICD-10-CM | POA: Diagnosis present

## 2023-10-16 DIAGNOSIS — N1831 Chronic kidney disease, stage 3a: Secondary | ICD-10-CM | POA: Diagnosis present

## 2023-10-16 DIAGNOSIS — Z833 Family history of diabetes mellitus: Secondary | ICD-10-CM | POA: Diagnosis not present

## 2023-10-16 DIAGNOSIS — N179 Acute kidney failure, unspecified: Secondary | ICD-10-CM | POA: Diagnosis present

## 2023-10-16 LAB — CBC
HCT: 32.9 % — ABNORMAL LOW (ref 39.0–52.0)
Hemoglobin: 10.5 g/dL — ABNORMAL LOW (ref 13.0–17.0)
MCH: 30.7 pg (ref 26.0–34.0)
MCHC: 31.9 g/dL (ref 30.0–36.0)
MCV: 96.2 fL (ref 80.0–100.0)
Platelets: 217 K/uL (ref 150–400)
RBC: 3.42 MIL/uL — ABNORMAL LOW (ref 4.22–5.81)
RDW: 13.2 % (ref 11.5–15.5)
WBC: 6.5 K/uL (ref 4.0–10.5)
nRBC: 0 % (ref 0.0–0.2)

## 2023-10-16 LAB — COMPREHENSIVE METABOLIC PANEL WITH GFR
ALT: 16 U/L (ref 0–44)
AST: 25 U/L (ref 15–41)
Albumin: 3.1 g/dL — ABNORMAL LOW (ref 3.5–5.0)
Alkaline Phosphatase: 55 U/L (ref 38–126)
Anion gap: 9 (ref 5–15)
BUN: 17 mg/dL (ref 8–23)
CO2: 25 mmol/L (ref 22–32)
Calcium: 8.8 mg/dL — ABNORMAL LOW (ref 8.9–10.3)
Chloride: 107 mmol/L (ref 98–111)
Creatinine, Ser: 1.06 mg/dL (ref 0.61–1.24)
GFR, Estimated: >60 mL/min (ref >60)
Glucose, Bld: 102 mg/dL — ABNORMAL HIGH (ref 70–99)
Potassium: 4.4 mmol/L (ref 3.5–5.1)
Sodium: 141 mmol/L (ref 135–145)
Total Bilirubin: 0.9 mg/dL (ref 0.0–1.2)
Total Protein: 5.8 g/dL — ABNORMAL LOW (ref 6.5–8.1)

## 2023-10-16 MED ORDER — ORAL CARE MOUTH RINSE: 15.0000 mL | OROMUCOSAL | Status: DC | PRN

## 2023-10-16 NOTE — Evaluation (Addendum)
 Occupational Therapy Evaluation Patient Details Name: Harry Zamora MRN: [id number] DOB: 14-Feb-1941 Today's Date: 10/16/2023   History of Present Illness   Harry Zamora is a 83 y.o. male who presented to [facility] ED 10/15/23 s/p two falls within 20 minutes. Pt reports his legs gave out and he hit his head. Imaging negative for acute traumatic injury. PMH: HTN, HLD, DMII, neuropathy, depression, RLS, syncope, CKD 3A, CAD, PAD, CAD, aortic Rosal, aortic aneurysm, anemia, prostate cx, ILD, hepatic steatosis, L-spine stenosis, chronic pain      Clinical Impressions At baseline, pt is Ind to Mod I for most ADLs and Mod I for functional mobility with a SPC or Rollator. Pt receives assistance from wife for showering, transportation, and other IADLs. Pt has a history of multiple falls. Pt now presents with decreased activity tolerance, decreased B UE strength, decreased balance, impaired cardiopulmonary status, and decreased safety and independence with functional tasks. Pt currently demonstrates ability to largely complete ADLs Mod I to Min assist and functional transfers with a RW with Contact guard to Min assist. Further mobility deferred this session for pt/therapist safety due to pt with episode of orthostatic hypotension in standing with abdominal binder donned. Pt reported no dizziness during session, but did report feeling of mild tightness/soreness across his neck and out toward his shoulders in standing and reports often having this feeling upon standing at home, as well. Pt participated well in session. Pt will benefit from acute skilled OT services to address deficits outlined below and increase safety and independence with functional tasks. Post acute discharge, pt will benefit from [facility] OT for home safety assessment and to decrease risk of falls and decrease risk of rehospitalization.   Orthostatics: Sitting in reclined position with feet up w/out abdominal binder - BP: 119/73, HR: 87 bpm Sitting with  feet on floor w/out abdominal binder - BP: 122/67, HR: 85 bpm Sitting with feet on floor with abdominal binder donned - BP: 110/71, HR: 82 bpm Standing at 0 minutes with abdominal binder donned - BP: 89/62, HR: 76 bpm Standing at 3 minutes with abdominal binder donned - BP: 87/57, HR: 87 bpm     If plan is discharge home, recommend the following:   A little help with walking and/or transfers;A little help with bathing/dressing/bathroom;Assistance with cooking/housework;Assist for transportation;Help with stairs or ramp for entrance     Functional Status Assessment   Patient has had a recent decline in their functional status and demonstrates the ability to make significant improvements in function in a reasonable and predictable amount of time.     Equipment Recommendations   None recommended by OT (Pt already has needed equipment)     Recommendations for Other Services         Precautions/Restrictions   Precautions Precautions: Fall Precaution/Restrictions Comments: watch BP Required Braces or Orthoses: Other Brace Other Brace: abdominal binder Restrictions Weight Bearing Restrictions Per Provider Order: No     Mobility Bed Mobility Overal bed mobility: Needs Assistance             General bed mobility comments: Pt sitting in recliner at beginning and end of session.    Transfers Overall transfer level: Needs assistance Equipment used: Rolling walker (2 wheels) Transfers: Sit to/from Stand, Bed to chair/wheelchair/BSC Sit to Stand: Contact guard assist     Step pivot transfers: Contact guard assist, Min assist     General transfer comment: deferred further ambulation this session due to episode of orthostatic hypotension  Balance Overall balance assessment: Needs assistance, History of Falls Sitting-balance support: No upper extremity supported, Feet supported Sitting balance-Leahy Scale: Good     Standing balance support: Single  extremity supported, Bilateral upper extremity supported, During functional activity, Reliant on assistive device for balance Standing balance-Leahy Scale: Poor Standing balance comment: Dependent on UE support                           ADL either performed or assessed with clinical judgement   ADL Overall ADL's : Needs assistance/impaired Eating/Feeding: Modified independent;Sitting Eating/Feeding Details (indicate cue type and reason): requires increased time for opening containers/packages Grooming: Set up;Sitting   Upper Body Bathing: Set up;Supervision/ safety;Sitting   Lower Body Bathing: Contact guard assist;Sit to/from stand   Upper Body Dressing : Set up;Sitting   Lower Body Dressing: Contact guard assist;Sit to/from stand   Toilet Transfer: Contact guard assist;Minimal assistance;Rolling walker (2 wheels);BSC/3in1 Toilet Transfer Details (indicate cue type and reason): simulated at recliner Toileting- Clothing Manipulation and Hygiene: Contact guard assist;Minimal assistance;Sit to/from stand       Functional mobility during ADLs:  (deferred for pt/therapist safety due to episode of hypotension) General ADL Comments: Pt limited by decreased activity tolerance and episode of orthostatic hypotension     Vision Baseline Vision/History: 1 Wears glasses Ability to See in Adequate Light: 0 Adequate (with glasses) Patient Visual Report: No change from baseline       Perception         Praxis         Pertinent Vitals/Pain Pain Assessment Pain Assessment: No/denies pain     Extremity/Trunk Assessment Upper Extremity Assessment Upper Extremity Assessment: Right hand dominant;RUE deficits/detail;LUE deficits/detail RUE Deficits / Details: Hx of RTC tear; Grossly 3/5 shoulder strength, 3+/5 elbow strength, and 3-/5 grip strength; ROM and coordination WFL RUE Sensation: WNL RUE Coordination: WNL LUE Deficits / Details: AROM WFL. Grossly 4-/5 strength LUE  Sensation: WNL LUE Coordination: WNL   Lower Extremity Assessment Lower Extremity Assessment: Defer to PT evaluation   Cervical / Trunk Assessment Cervical / Trunk Assessment: Kyphotic   Communication Communication Communication: No apparent difficulties   Cognition Arousal: Alert Behavior During Therapy: WFL for tasks assessed/performed Cognition: No apparent impairments             OT - Cognition Comments: AAOx4 and pleasant throughout session. Cognition WFL for tasks assessed.                 Following commands: Intact       Cueing  General Comments   Cueing Techniques: Verbal cues  Pt with episode of orthosatitc hypotension in standing with abdominal binder donned.   Exercises     Shoulder Instructions      Home Living Family/patient expects to be discharged to:: Private residence Living Arrangements: Spouse/significant other Available Help at Discharge: Family;Available 24 hours/day Type of Home: House Home Access: Level entry     Home Layout: One level     Bathroom Shower/Tub: Producer, television/film/video: Standard Bathroom Accessibility: Yes How Accessible: Accessible via walker Home Equipment: Cane - single point;Rollator (4 wheels);Shower seat;Toilet riser;Grab bars - tub/shower;Other (comment);Standard Walker;Cane - quad (suction cup grab bars)   Additional Comments: has a dog      Prior Functioning/Environment Prior Level of Function : Independent/Modified Independent;History of Falls (last six months);Needs assist             Mobility Comments: Ambulates with SPC or  rollator. Pt reports using Rollator more often recently. Multiple falls, feels like his legs give out on him. ADLs Comments: Indep to ModI with most ADLs. Wife privides [employer] to [facility] for bathing and manages all IADLs. Pt does not drive. 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    OT Problem  List: Decreased strength;Decreased activity tolerance;Impaired balance (sitting and/or standing);Cardiopulmonary status limiting activity   OT Treatment/Interventions: Self-care/ADL training;Therapeutic exercise;Energy conservation;DME and/or AE instruction;Therapeutic activities;Balance training;Patient/family education      OT Goals(Current goals can be found in the care plan section)   Acute Rehab OT Goals Patient Stated Goal: to stop falling so that he doesn't end up breaking a hip OT Goal Formulation: With patient Time For Goal Achievement: 10/30/23 Potential to Achieve Goals: Good ADL Goals Pt Will Perform Grooming: with modified independence;standing Pt Will Perform Lower Body Bathing: with supervision;sitting/lateral leans;sit to/from stand Pt Will Perform Lower Body Dressing: with supervision;sitting/lateral leans;sit to/from stand Pt Will Transfer to Toilet: with modified independence;ambulating;regular height toilet (with least restrictive AD) Pt/caregiver will Perform Home Exercise Program: Increased strength;Both right and left upper extremity;With theraband;Independently;With written HEP provided (increase activity tolerance) Additional ADL Goal #1: Patient will demonstrate understanding through teach back of education in recognizing, managing, and appropriately responding to episodes of orthostatic hypotension.   OT Frequency:  Min 2X/week    Co-evaluation              AM-PAC OT 6 Clicks Daily Activity     Outcome Measure Help from another person eating meals?: None Help from another person taking care of personal grooming?: A Little Help from another person toileting, which includes using toliet, bedpan, or urinal?: A Little Help from another person bathing (including washing, rinsing, drying)?: A Little Help from another person to put on and taking off regular upper body clothing?: A Little Help from another person to put on and taking off regular lower body  clothing?: A Little 6 Click Score: 19   End of Session Equipment Utilized During Treatment: Rolling walker (2 wheels);Gait belt;Other (comment) (abdominal binder) Nurse Communication: Mobility status;Other (comment) (Pt with episode of orthstatic hypotension this session with abdominal binder donned.)  Activity Tolerance: Patient tolerated treatment well;Treatment limited secondary to medical complications (Comment) (limited by episode of orthosatic hypotension) Patient left: in chair;with call bell/phone within reach;with chair alarm set  OT Visit Diagnosis: Unsteadiness on feet (R26.81);Other abnormalities of gait and mobility (R26.89);Repeated falls (R29.6);History of falling (Z91.81);Muscle weakness (generalized) (M62.81)                Time: 8389-8367 OT Time Calculation (min): 22 min Charges:  OT General Charges $OT Visit: 1 Visit OT Evaluation $OT Eval Moderate Complexity: 1 Mod  Margarie Rockey HERO., OTR/L, MA Acute Rehab [phone]   Margarie FORBES Horns 10/16/2023, 5:39 PM

## 2023-10-16 NOTE — Progress Notes (Signed)
 [facility] PROGRESS NOTE    Progress Note  Harry Zamora  FMW:[id number] DOB: 29-Jan-1941 DOA: 10/15/2023 PCP: Nichole Senior, MD    Brief Narrative:   Harry Zamora is an 83 y.o. male past medical history significant for essential hypertension, diabetes mellitus type 2 diabetic neuropathy, RLS chronic kidney see stage IIIa coronary artery disease/PAD/carotid artery disease aortic aneurysm prostate cancer lumbar spinal stenosis comes in after 2 falls within 20 minutes did fall back and hit his head but did not lost consciousness, he relates that he was recently diagnosed with esophageal dysmotility and has some weakness and weight loss, orthostatic vitals were initially positive in the ED.  In the ED: He was found to be orthostatic, and acute kidney injury with a creatinine 1.6, baseline creatinine less than 1 CT of the head showed no acute findings CT of the C-spine showed no acute fracture or dislocation it did show chronic blood pool hypodensity in the neck.  CT scan of the abdomen pelvis showed no acute finding    Assessment/Plan:   Acute kidney injury on chronic kidney disease stage IIIa: With a baseline creatinine of less than 1 orthostatics positive on admission. Was started on IV fluids, basic metabolic panels pending this morning. Will continue IV fluids for an additional 24 hours recheck basic metabolic panel tomorrow morning. Likely hemodynamically mediated.  Dysphagia: CT scan of the head and neck showed calcified atherosclerosis and bruit pul hypodensity  Fall secondary to orthostatic hypotension: Likely hypovolemic plus or minus autonomic dysfunction as he is on midodrine  already. Midodrine  has been resumed continue IV fluids.  Essential hypertension: Off antihypertensive medication.  Hyperlipidemia: Continue statins.  Peripheral artery disease/coronary artery disease: Continue statin and aspirin .  Normocytic anemia: Hemoglobin 11.6 appears to be at  baseline.  Neuropathy: Continue Lyrica .  RLS: Continue pramipexole.  History of prostate cancer: Noted.  IDL: Noted.  Lumbar spine stenosis: Appears to be chronic noted.   DVT prophylaxis: lovenox  Family Communication:none Status is: Observation The patient remains OBS appropriate and will d/c before 2 midnights.    Code Status:     Code Status Orders  (From admission, onward)           Start     Ordered   10/15/23 1309  Full code  Continuous       Question:  By:  Answer:  Consent: discussion documented in EHR   10/15/23 1321           Code Status History     Date Active Date Inactive Code Status Order ID Comments User Context   03/23/2023 1718 04/01/2023 1549 Full Code [id number]  Pegge Toribio PARAS, PA-C Inpatient   03/23/2023 1718 03/23/2023 1718 Full Code [id number]  Pegge Toribio PARAS, PA-C Inpatient   03/14/2023 0035 03/23/2023 1610 Full Code [id number]  Voncile Isles, MD ED   04/24/2021 1448 04/26/2021 2215 Full Code [id number]  Dawley, Lani BROCKS, DO Inpatient   03/12/2021 1915 03/14/2021 1642 Full Code [id number]  Silvester Ales, MD Inpatient   10/01/2020 1247 10/01/2020 2337 Full Code [id number]  Elmira Newman PARAS, MD Inpatient   09/17/2020 1643 09/17/2020 2342 Full Code [id number]  Elmira Newman PARAS, MD Inpatient   09/12/2020 2010 09/13/2020 1931 Full Code [id number]  Charlton Evalene RAMAN, MD Inpatient   09/07/2019 1144 09/08/2019 1846 Full Code [id number]  Unice Pac, MD Inpatient   07/13/2019 1816 07/15/2019 1507 Full Code [id number]  Unice Pac, MD Inpatient   10/02/2016 1823 10/03/2016 1311 Full Code [id number]  Unice Pac,  MD Inpatient         IV Access:   Peripheral IV   Procedures and diagnostic studies:   CT CHEST ABDOMEN PELVIS W CONTRAST Result Date: 10/15/2023 CLINICAL DATA:  83 year old male with multiple recent falls. Weakness. EXAM: CT CHEST, ABDOMEN, AND PELVIS WITH CONTRAST TECHNIQUE: Multidetector CT imaging of the chest, abdomen and pelvis was  performed following the standard protocol during bolus administration of intravenous contrast. RADIATION DOSE REDUCTION: This exam was performed according to the departmental dose-optimization program which includes automated exposure control, adjustment of the mA and/or kV according to patient size and/or use of iterative reconstruction technique. CONTRAST:  75mL OMNIPAQUE  IOHEXOL  350 MG/ML SOLN COMPARISON:  Chest CT 05/27/2023.  CT Abdomen and Pelvis 03/15/2023. FINDINGS: CT CHEST FINDINGS Cardiovascular: Calcified aortic atherosclerosis. Calcified coronary artery atherosclerosis is extensive. Heart size remains normal. No pericardial effusion. No mediastinal vascular injury identified. Mediastinum/Nodes: Negative for mediastinal hematoma, mass, lymphadenopathy. Lungs/Pleura: Trace retained secretions in the trachea, carina, and mild secretions in the left mainstem bronchus. But otherwise the major airways are patent. Somewhat hyperinflated lungs, some background evidence of chronic pulmonary emphysema. Chronic lower lobe and especially costophrenic angle subpleural lung scarring. No pneumothorax, pleural effusion, pulmonary contusion. Scattered small calcified granulomas in the lungs, benign. No acute or suspicious lung nodule. Musculoskeletal: Thoracic hyperostosis resulting in some levels of thoracic interbody ankylosis from T4 through T10. Stable thoracic spine. No acute osseous abnormality identified. CT ABDOMEN PELVIS FINDINGS Hepatobiliary: Chronic cholecystectomy. Liver appears intact. No perihepatic fluid. Pancreas: Atrophied. Spleen: Spleen appears intact.  No perisplenic fluid. Adrenals/Urinary Tract: Normal adrenal glands. Normal renal enhancement and early contrast excretion. Diminutive ureters. Diminutive and unremarkable bladder. Stomach/Bowel: Similar large bowel retained stool and redundancy. More retained gas than stool in the transverse colon now. No large bowel inflammation. Decompressed  terminal ileum. Cecum on a lax mesentery. Diminutive and normal appendix on coronal image 37. No dilated small bowel. Mild gas and fluid in the stomach. Negative duodenum. No pneumoperitoneum, free fluid, mesenteric inflammation identified. Vascular/Lymphatic: Extensive Aortoiliac calcified atherosclerosis. Normal caliber abdominal aorta. Major arterial structures in the abdomen and pelvis are patent. Patent portal venous system on the delayed images. No lymphadenopathy identified. Reproductive: Chronic prostate brachytherapy. Other: No pelvis free fluid. Musculoskeletal: Chronic lumbar spine degeneration superimposed on widespread chronic lumbar spinal fusion, multilevel decompression. Stable CT appearance since last year. Chronic bilateral iliac cortical bone screws. Sacrum, SI joints, pelvis, and proximal femurs appear stable and intact. IMPRESSION: No acute traumatic injury identified in the Chest, Abdomen, or Pelvis. Aortic Atherosclerosis (ICD10-I70.0). Emphysema (ICD10-J43.9). Chronic spine surgery and degeneration. Electronically Signed   By: VEAR Hurst M.D.   On: 10/15/2023 08:45   CT Cervical Spine Wo Contrast Result Date: 10/15/2023 CLINICAL DATA:  83 year old male with multiple recent falls. Weakness. EXAM: CT CERVICAL SPINE WITHOUT CONTRAST TECHNIQUE: Multidetector CT imaging of the cervical spine was performed without intravenous contrast. Multiplanar CT image reconstructions were also generated. RADIATION DOSE REDUCTION: This exam was performed according to the departmental dose-optimization program which includes automated exposure control, adjustment of the mA and/or kV according to patient size and/or use of iterative reconstruction technique. COMPARISON:  Recent cervical spine CT 10/06/2023. Head and face CT today.  Face CT and cervical spine CT 02/21/2023. Cervical spine MRI 03/18/2023. FINDINGS: Alignment: Chronic straightening and mild reversal of cervical lordosis is stable from last year.  Cervicothoracic junction alignment is within normal limits. Bilateral posterior element alignment is within normal limits. Skull base and vertebrae: Bone mineralization is  within normal limits for age. Visualized skull base is intact. No atlanto-occipital dissociation. C1 and C2 appear intact and aligned. Advanced cervical spine degeneration, detailed below. Nonacute nondisplaced fracture of left C4 superior articulating facet degenerative osteophyte (series 10, image 39), new from last year but stable from the recent CT, age indeterminate and appears inconsequential. No acute osseous abnormality identified. Soft tissues and spinal canal: No prevertebral fluid or swelling. No visible canal hematoma. Stable noncontrast neck soft tissues with calcified atherosclerosis and blood pool hypodensity. Disc levels: Chronically advanced cervical spine degeneration superimposed on degenerative C2-C3 facet ankylosis on the left. Additional mid and upper cervical facet arthropathy, including left C4 level with discontinuous osteophyte as detailed above. Associated chronic degenerative anterolisthesis of C3 on C4. And CT evidence of chronic multilevel cervical spinal stenosis, stable from last year. See also cervical spine MRI 03/18/2023. Upper chest: Chest CT today reported separately. Negative visible noncontrast thoracic inlet. IMPRESSION: 1. No acute traumatic injury identified in the cervical spine. 2. Chronically advanced cervical spine degeneration. Chronic facet arthropathy, and multilevel spinal stenosis as demonstrated on cervical spine MRI 03/18/2023 (please see that report). 3. Chronic blood pool hypodensity in the neck, query Anemia. Calcified carotid atherosclerosis. Electronically Signed   By: VEAR Hurst M.D.   On: 10/15/2023 08:36   CT Maxillofacial Wo Contrast Result Date: 10/15/2023 CLINICAL DATA:  83 year old male with multiple recent falls. Weakness. EXAM: CT MAXILLOFACIAL WITHOUT CONTRAST TECHNIQUE:  Multidetector CT imaging of the maxillofacial structures was performed. Multiplanar CT image reconstructions were also generated. RADIATION DOSE REDUCTION: This exam was performed according to the departmental dose-optimization program which includes automated exposure control, adjustment of the mA and/or kV according to patient size and/or use of iterative reconstruction technique. COMPARISON:  CT head and cervical spine today.  Face CT 02/21/2023. FINDINGS: Osseous: Mandible intact and normally located. Mild progression of carious dentition since last year. Bilateral maxilla, zygoma, pterygoid, nasal bones appear stable and intact. Central skull base and cervical vertebrae appear aligned. Cervical spine detailed separately. Orbits: Intact orbital walls. Postoperative changes to the globes, orbits soft tissues appear stable and negative. Sinuses: Clear. Soft tissues: Negative visible noncontrast larynx, pharynx, parapharyngeal spaces, retropharyngeal space, sublingual space, submandibular glands, masticator and parotid spaces. Calcified carotid atherosclerosis and blood pool hypodensity is chronic. Limited intracranial: Calcified atherosclerosis at the skull base. Stable brain to that the tail separately today. IMPRESSION: No acute traumatic injury identified in the Face. Electronically Signed   By: VEAR Hurst M.D.   On: 10/15/2023 08:32   CT Head Wo Contrast Result Date: 10/15/2023 CLINICAL DATA:  83 year old male with multiple recent falls. Weakness. EXAM: CT HEAD WITHOUT CONTRAST TECHNIQUE: Contiguous axial images were obtained from the base of the skull through the vertex without intravenous contrast. RADIATION DOSE REDUCTION: This exam was performed according to the departmental dose-optimization program which includes automated exposure control, adjustment of the mA and/or kV according to patient size and/or use of iterative reconstruction technique. COMPARISON:  Face and cervical spine CT today reported  separately. Head CT 10/06/2023. Brain MRI 03/14/2023. FINDINGS: Brain: Stable cerebral volume from last year. No midline shift, ventriculomegaly, mass effect, evidence of mass lesion, intracranial hemorrhage or evidence of cortically based acute infarction. Stable gray-white differentiation, basal ganglia vascular calcifications. Vascular: Calcified atherosclerosis at the skull base. No suspicious intracranial vascular hyperdensity. Skull: Stable and intact. Sinuses/Orbits: Visualized paranasal sinuses and mastoids are stable and well aerated. Other: Broad-based posterior convexity, occipital scalp hematoma has regressed. No new orbit or scalp soft  tissue abnormality identified. IMPRESSION: 1. No acute intracranial abnormality or acute traumatic injury identified. 2. Regressed posterior scalp hematoma since 10/06/2023. Electronically Signed   By: VEAR Hurst M.D.   On: 10/15/2023 08:29     Medical Consultants:   None.   Subjective:    Harry Zamora Harry Zamora currently asymptomatic he relates he is hungry.  Objective:    Vitals:   10/15/23 1453 10/15/23 1720 10/15/23 2035 10/16/23 0445  BP: (!) 110/58 123/61 119/63 117/77  Pulse: 71 71 71 78  Resp: 18 18 20 18   Temp: 98.1 F (36.7 C) 97.8 F (36.6 C) 98 F (36.7 C) 98.2 F (36.8 C)  TempSrc: Oral Oral    SpO2: 100%  100% 100%  Weight: 69 kg     Height: 6' 2 (1.88 m)      SpO2: 100 %   Intake/Output Summary (Last 24 hours) at 10/16/2023 0629 Last data filed at 10/16/2023 0452 Gross per 24 hour  Intake 2722.13 ml  Output 1290 ml  Net 1432.13 ml   Filed Weights   10/15/23 0550 10/15/23 1453  Weight: 69.9 kg 69 kg    Exam: General exam: In no acute distress. Respiratory system: Good air movement and clear to auscultation. Cardiovascular system: S1 & S2 heard, RRR. No JVD.SABRA  Gastrointestinal system: Abdomen is nondistended, soft and nontender.  Extremities: No pedal edema. Skin: No rashes, lesions or ulcers Psychiatry: Judgement and  insight appear normal. Mood & affect appropriate.    Data Reviewed:    Labs: Basic Metabolic Panel: Recent Labs  Lab 10/15/23 0633  NA 138  K 4.2  CL 100  CO2 28  GLUCOSE 90  BUN 31*  CREATININE 1.66*  CALCIUM  9.3   GFR Estimated Creatinine Clearance: 32.3 mL/min (A) (by C-G formula based on SCr of 1.66 mg/dL (H)). Liver Function Tests: Recent Labs  Lab 10/15/23 0633  AST 19  ALT 15  ALKPHOS 60  BILITOT 0.9  PROT 6.6  ALBUMIN  3.5   No results for input(s): LIPASE, AMYLASE in the last 168 hours. No results for input(s): AMMONIA in the last 168 hours. Coagulation profile No results for input(s): INR, PROTIME in the last 168 hours. COVID-19 Labs  No results for input(s): DDIMER, FERRITIN, LDH, CRP in the last 72 hours.  Lab Results  Component Value Date   SARSCOV2NAA NEGATIVE 10/06/2023   SARSCOV2NAA NEGATIVE 03/14/2023   SARSCOV2NAA NEGATIVE 11/01/2022   SARSCOV2NAA NEGATIVE 04/22/2021    CBC: Recent Labs  Lab 10/15/23 0633  WBC 6.7  NEUTROABS 4.2  HGB 11.6*  HCT 35.4*  MCV 94.7  PLT 239   Cardiac Enzymes: No results for input(s): CKTOTAL, CKMB, CKMBINDEX, TROPONINI in the last 168 hours. BNP (last 3 results) No results for input(s): PROBNP in the last 8760 hours. CBG: Recent Labs  Lab 10/15/23 1022 10/15/23 1419 10/15/23 2150  GLUCAP 68* 133* 144*   D-Dimer: No results for input(s): DDIMER in the last 72 hours. Hgb A1c: No results for input(s): HGBA1C in the last 72 hours. Lipid Profile: No results for input(s): CHOL, HDL, LDLCALC, TRIG, CHOLHDL, LDLDIRECT in the last 72 hours. Thyroid  function studies: No results for input(s): TSH, T4TOTAL, T3FREE, THYROIDAB in the last 72 hours.  Invalid input(s): FREET3 Anemia work up: No results for input(s): VITAMINB12, FOLATE, FERRITIN, TIBC, IRON, RETICCTPCT in the last 72 hours. Sepsis Labs: Recent Labs  Lab 10/15/23 0633   WBC 6.7   Microbiology Recent Results (from the past 240 hours)  Resp panel  by RT-PCR (RSV, Flu A&B, Covid) Anterior Nasal Swab     Status: None   Collection Time: 10/06/23  4:35 PM   Specimen: Anterior Nasal Swab  Result Value Ref Range Status   SARS Coronavirus 2 by RT PCR NEGATIVE NEGATIVE Final    Comment: (NOTE) SARS-CoV-2 target nucleic acids are NOT DETECTED.  The SARS-CoV-2 RNA is generally detectable in upper respiratory specimens during the acute phase of infection. The lowest concentration of SARS-CoV-2 viral copies this assay can detect is 138 copies/mL. A negative result does not preclude SARS-Cov-2 infection and should not be used as the sole basis for treatment or other patient management decisions. A negative result may occur with  improper specimen collection/handling, submission of specimen other than nasopharyngeal swab, presence of viral mutation(s) within the areas targeted by this assay, and inadequate number of viral copies(<138 copies/mL). A negative result must be combined with clinical observations, patient history, and epidemiological information. The expected result is Negative.  Fact Sheet for Patients:  BloggerCourse.com  Fact Sheet for Healthcare Providers:  SeriousBroker.it  This test is no t yet approved or cleared by the [place]  FDA and  has been authorized for detection and/or diagnosis of SARS-CoV-2 by FDA under an Emergency Use Authorization (EUA). This EUA will remain  in effect (meaning this test can be used) for the duration of the COVID-19 declaration under Section 564(b)(1) of the Act, 21 U.S.C.section 360bbb-3(b)(1), unless the authorization is terminated  or revoked sooner.       Influenza A by PCR NEGATIVE NEGATIVE Final   Influenza B by PCR NEGATIVE NEGATIVE Final    Comment: (NOTE) The Xpert Xpress SARS-CoV-2/FLU/RSV plus assay is intended as an aid in the diagnosis of  influenza from Nasopharyngeal swab specimens and should not be used as a sole basis for treatment. Nasal washings and aspirates are unacceptable for Xpert Xpress SARS-CoV-2/FLU/RSV testing.  Fact Sheet for Patients: BloggerCourse.com  Fact Sheet for Healthcare Providers: SeriousBroker.it  This test is not yet approved or cleared by the [place]  FDA and has been authorized for detection and/or diagnosis of SARS-CoV-2 by FDA under an Emergency Use Authorization (EUA). This EUA will remain in effect (meaning this test can be used) for the duration of the COVID-19 declaration under Section 564(b)(1) of the Act, 21 U.S.C. section 360bbb-3(b)(1), unless the authorization is terminated or revoked.     Resp Syncytial Virus by PCR NEGATIVE NEGATIVE Final    Comment: (NOTE) Fact Sheet for Patients: BloggerCourse.com  Fact Sheet for Healthcare Providers: SeriousBroker.it  This test is not yet approved or cleared by the [place]  FDA and has been authorized for detection and/or diagnosis of SARS-CoV-2 by FDA under an Emergency Use Authorization (EUA). This EUA will remain in effect (meaning this test can be used) for the duration of the COVID-19 declaration under Section 564(b)(1) of the Act, 21 U.S.C. section 360bbb-3(b)(1), unless the authorization is terminated or revoked.  Performed at [facility], [address] [place], [place] [address]      Medications:    aspirin  EC  81 mg Oral Daily   enoxaparin  (LOVENOX ) injection  40 mg Subcutaneous Q24H   midodrine   5 mg Oral TID WC   pantoprazole   40 mg Oral Daily   rosuvastatin   20 mg Oral QHS   sodium chloride  flush  3 mL Intravenous Q12H   Continuous Infusions:  sodium chloride  150 mL/hr at 10/15/23 2349      LOS: 0 days   Harry Zamora  Celinda  [facility]  10/16/2023, 6:29 AM

## 2023-10-16 NOTE — Progress Notes (Signed)
 Physical Therapy Treatment Patient Details Name: Harry Zamora MRN: [id number] DOB: 03/10/1941 Today's Date: 10/16/2023   History of Present Illness Harry Zamora is a 83 y.o. male who presented to [facility] ED 10/15/23 s/p two falls within 20 minutes. Pt reports his legs gave out and he hit his head. Imaging negative for acute traumatic injury. No PMHx on file.    PT Comments  Continuing work on functional mobility and activity tolerance;  Session focused on wathcing symptoms and BP respnse to positional change, upright standing, and walking; Notably better activity tolerance than PT eval, however still with BP drops with incr time in standing; will consider ace wrapping LEs and abdominal binder next session; see other PT note for more details re: BPs    If plan is discharge home, recommend the following: A little help with walking and/or transfers;A little help with bathing/dressing/bathroom;Assistance with cooking/housework;Assist for transportation;Help with stairs or ramp for entrance   Can travel by private vehicle        Equipment Recommendations  None recommended by PT (Pt already has DME)    Recommendations for Other Services       Precautions / Restrictions Precautions Precautions: Fall Precaution/Restrictions Comments: watch BP Required Braces or Orthoses: Other Brace Other Brace: abdominal binder (in room) Restrictions Weight Bearing Restrictions Per Provider Order: No     Mobility  Bed Mobility Overal bed mobility: Needs Assistance Bed Mobility: Supine to Sit     Supine to sit: Supervision, HOB elevated, Used rails     General bed mobility comments: Used rails; supervision mostly for lines    Transfers Overall transfer level: Needs assistance Equipment used: Rolling walker (2 wheels) Transfers: Sit to/from Stand, Bed to chair/wheelchair/BSC Sit to Stand: Min assist, Contact guard assist   Step pivot transfers: Contact guard assist, Min assist       General  transfer comment: initially with posterior bias and braced backs of LEs against bed for stabiltiy; next 2 times pt performed sit to stand from recliner with good use of rails and smooth rise    Ambulation/Gait Ambulation/Gait assistance: Min assist Gait Distance (Feet): 30 Feet Assistive device: Rolling walker (2 wheels) Gait Pattern/deviations: Step-through pattern       General Gait Details: VERY close watch of activity tolerance   Stairs             Wheelchair Mobility     Tilt Bed    Modified Rankin (Stroke Patients Only)       Balance Overall balance assessment: Needs assistance, History of Falls Sitting-balance support: No upper extremity supported, Feet supported Sitting balance-Leahy Scale: Good     Standing balance support: Single extremity supported, Bilateral upper extremity supported, During functional activity, Reliant on assistive device for balance Standing balance-Leahy Scale: Poor Standing balance comment: Dependent on UE support                            Communication Communication Communication: No apparent difficulties  Cognition Arousal: Alert Behavior During Therapy: WFL for tasks assessed/performed                             Following commands: Intact      Cueing Cueing Techniques: Verbal cues  Exercises      General Comments General comments (skin integrity, edema, etc.): See other PT note of this date for serial BPs      Pertinent  Vitals/Pain Pain Assessment Pain Assessment: No/denies pain    Home Living Family/patient expects to be discharged to:: Private residence Living Arrangements: Spouse/significant other Available Help at Discharge: Family;Available 24 hours/day Type of Home: House Home Access: Level entry       Home Layout: One level Home Equipment: Cane - single point;Rollator (4 wheels);Shower seat;Toilet riser;Grab bars - tub/shower;Other (comment);Standard Walker;Cane - quad  (suction cup grab bars) Additional Comments: has a dog    Prior Function            PT Goals (current goals can now be found in the care plan section) Acute Rehab PT Goals Patient Stated Goal: Return Home and stop falling PT Goal Formulation: With patient/family Time For Goal Achievement: 10/29/23 Potential to Achieve Goals: Good Progress towards PT goals: Progressing toward goals    Frequency    Min 2X/week      PT Plan      Co-evaluation              AM-PAC PT 6 Clicks Mobility   Outcome Measure  Help needed turning from your back to your side while in a flat bed without using bedrails?: None Help needed moving from lying on your back to sitting on the side of a flat bed without using bedrails?: A Little Help needed moving to and from a bed to a chair (including a wheelchair)?: A Little Help needed standing up from a chair using your arms (e.g., wheelchair or bedside chair)?: A Little Help needed to walk in hospital room?: A Little Help needed climbing 3-5 steps with a railing? : A Little 6 Click Score: 19    End of Session Equipment Utilized During Treatment: Gait belt Activity Tolerance: Patient tolerated treatment well Patient left: in chair;with call bell/phone within reach;with chair alarm set Nurse Communication: Mobility status;Other (comment) (BP response) PT Visit Diagnosis: Difficulty in walking, not elsewhere classified (R26.2);Unsteadiness on feet (R26.81);History of falling (Z91.81);Repeated falls (R29.6)     Time: 8487-8444 PT Time Calculation (min) (ACUTE ONLY): 43 min  Charges:    $Gait Training: 8-22 mins $Therapeutic Activity: 23-37 mins PT General Charges $$ ACUTE PT VISIT: 1 Visit                     Silvano Currier, PT  Acute Rehabilitation Services Office [phone] Secure Chat welcomed    Silvano VEAR Currier 10/16/2023, 5:41 PM

## 2023-10-16 NOTE — Progress Notes (Signed)
 Physical Therapy Treatment Note  (Full PT treatment note to follow)  Serial BPs in sitting and standing as follows:     10/16/23 1515 10/16/23 1520 10/16/23 1525  Orthostatic Sitting  BP- Sitting (!) 85/51 95/72 110/86  Pulse- Sitting 80 85 (after sitting approx 5 minutes) 90 (taken seated, immediately after attempt at getting standing BP -- posit that SBP of 110 mmHg shows an overshoot response after BP was quite low in standing)  Orthostatic Standing at 0 minutes  BP- Standing at 0 minutes  --   (Attempted, unable to get a standing BP reading)  --   Pulse- Standing at 0 minutes  --   --   --   Orthostatic Standing at 3 minutes  BP- Standing at 3 minutes  --   --   --   Pulse- Standing at 3 minutes  --   --   --     10/16/23 1530 10/16/23 1535 10/16/23 1540  Orthostatic Sitting  BP- Sitting  --  115/64 120/80  Pulse- Sitting  --  86 (taken seated, feet down) 76 (taken seated, reclined, feet up)  Orthostatic Standing at 0 minutes  BP- Standing at 0 minutes (!) 113/107 (standing, after hallway walk)  --   --   Pulse- Standing at 0 minutes 72  --   --   Orthostatic Standing at 3 minutes  BP- Standing at 3 minutes 91/59  --   --   Pulse- Standing at 3 minutes 95  --   --    Silvano Currier, PT  Acute Rehabilitation Services Office [phone] Secure Chat welcomed

## 2023-10-17 DIAGNOSIS — W19XXXA Unspecified fall, initial encounter: Secondary | ICD-10-CM | POA: Diagnosis not present

## 2023-10-17 DIAGNOSIS — R42 Dizziness and giddiness: Secondary | ICD-10-CM | POA: Diagnosis not present

## 2023-10-17 DIAGNOSIS — N179 Acute kidney failure, unspecified: Secondary | ICD-10-CM | POA: Diagnosis not present

## 2023-10-17 LAB — CBC
HCT: 32.1 % — ABNORMAL LOW (ref 39.0–52.0)
Hemoglobin: 10.6 g/dL — ABNORMAL LOW (ref 13.0–17.0)
MCH: 30.9 pg (ref 26.0–34.0)
MCHC: 33 g/dL (ref 30.0–36.0)
MCV: 93.6 fL (ref 80.0–100.0)
Platelets: 194 K/uL (ref 150–400)
RBC: 3.43 MIL/uL — ABNORMAL LOW (ref 4.22–5.81)
RDW: 13.3 % (ref 11.5–15.5)
WBC: 5.1 K/uL (ref 4.0–10.5)
nRBC: 0 % (ref 0.0–0.2)

## 2023-10-17 LAB — BASIC METABOLIC PANEL WITH GFR
Anion gap: 8 (ref 5–15)
BUN: 9 mg/dL (ref 8–23)
CO2: 27 mmol/L (ref 22–32)
Calcium: 8.6 mg/dL — ABNORMAL LOW (ref 8.9–10.3)
Chloride: 106 mmol/L (ref 98–111)
Creatinine, Ser: 0.82 mg/dL (ref 0.61–1.24)
GFR, Estimated: >60 mL/min (ref >60)
Glucose, Bld: 130 mg/dL — ABNORMAL HIGH (ref 70–99)
Potassium: 4.2 mmol/L (ref 3.5–5.1)
Sodium: 141 mmol/L (ref 135–145)

## 2023-10-17 MED ORDER — ASPIRIN 81 MG PO TBEC: 81.0000 mg | DELAYED_RELEASE_TABLET | Freq: Every day | ORAL | 12 refills | Status: AC

## 2023-10-17 NOTE — Discharge Summary (Signed)
 Physician Discharge Summary  Harry Zamora [facility] FMW:[id number] DOB: 09-Nov-1940 DOA: 10/15/2023  PCP: Nichole Senior, MD  Admit date: 10/15/2023 Discharge date: 10/17/2023  Admitted From: Home Disposition:  Home  Recommendations for Outpatient Follow-up:  Follow up with PCP in 1-2 weeks Please obtain BMP/CBC in one week   Home Health:No Equipment/Devices:None  Discharge Condition:Stable CODE STATUS:Full Diet recommendation: Heart Healthy  Brief/Interim Summary:  83 y.o. male past medical history significant for essential hypertension, diabetes mellitus type 2 diabetic neuropathy, RLS chronic kidney see stage IIIa coronary artery disease/PAD/carotid artery disease aortic aneurysm prostate cancer lumbar spinal stenosis comes in after 2 falls within 20 minutes did fall back and hit his head but did not lost consciousness, he relates that he was recently diagnosed with esophageal dysmotility and has some weakness and weight loss, orthostatic vitals were initially positive in the ED.   Discharge Diagnoses:  Active Problems:   Diabetic polyneuropathy (HCC)   Spinal stenosis of lumbar region with neurogenic claudication   PAD (peripheral artery disease) (HCC)   Type 2 diabetes mellitus (HCC)   Coronary artery disease involving native coronary artery of native heart without angina pectoris   Restless legs   Orthostatic hypotension   Chronic low back pain   Essential hypertension   Hyperlipidemia   Interstitial lung disease (HCC)   Major depressive disorder, recurrent, moderate (HCC)   Steatosis of liver   AKI (acute kidney injury) (HCC)  Acute kidney injury on chronic kidney disease stage IIIa: With a baseline creatinine of less than 1 on admission orthostatics were positive he was started on IV fluids and his creatinine returned to baseline. Likely hemodynamically mediated.  Dysphagia: He was able to tolerate his diet in the hospital, recommended to continue hydration.  Fall secondary to  orthostatic hypotension: Likely hypovolemic in the setting of decreased oral intake plus or minus autonomic dysfunction. He is on midodrine  has remained symptom-free. Continue midodrine  as an outpatient.  Hyperlipidemia: Excellent continue statins.  Peripheral arterial disease: Continue statin and aspirin .  Normocytic anemia: Hemoglobin appears to be at baseline.  Neuropathy: Continue Lyrica .  RLS: Continue current medication.  Discharge Instructions  Discharge Instructions     Diet - low sodium heart healthy   Complete by: As directed    Face-to-face encounter (required for Medicare/Medicaid patients)   Complete by: As directed    I Selinda Sias certify that this patient is under my care and that I, or a nurse practitioner or physician's assistant working with me, had a face-to-face encounter that meets the physician face-to-face encounter requirements with this patient on 10/15/2023. The encounter with the patient was in whole, or in part for the following medical condition(s) which is the primary reason for home health care (List medical condition): malnourishment, weakness. Further orders per PCP.   The encounter with the patient was in whole, or in part, for the following medical condition, which is the primary reason for home health care: worsening weakness, balance issues and falls, trouble swallowing   I certify that, based on my findings, the following services are medically necessary home health services:  Physical therapy Speech language pathology     Reason for Medically Necessary Home Health Services:  Therapy- Instruction on use of Assistive Device for Ambulation on all Surfaces Skilled Nursing- Change/Decline in Patient Status     My clinical findings support the need for the above services:  Unsafe ambulation due to balance issues Unable to leave home safely without assistance and/or assistive device  Further, I certify that my clinical findings support that  this patient is homebound due to: Ambulates short distances less than 300 feet   Home Health   Complete by: As directed    To provide the following care/treatments:  PT OT     Increase activity slowly   Complete by: As directed       Allergies as of 10/17/2023       Reactions   Lipitor [atorvastatin] Other (See Comments)   Myalgias    Levaquin [levofloxacin] Rash   Niaspan [niacin] Hives, Rash, Other (See Comments)   Flushing    Ultram [tramadol] Rash        Medication List     TAKE these medications    acetaminophen  500 MG tablet Commonly known as: TYLENOL  Take 500-1,000 mg by mouth 2 (two) times daily as needed for headache or moderate pain (pain score 4-6).   aspirin  EC 81 MG tablet Take 1 tablet (81 mg total) by mouth daily. Swallow whole.   CALCIUM  + VITAMIN D3 PO Take 1 tablet by mouth daily.   DULoxetine  30 MG capsule Commonly known as: CYMBALTA  Take 1 capsule (30 mg total) by mouth daily.   FeroSul 325 (65 Fe) MG tablet Generic drug: ferrous sulfate  Take 1 tablet (325 mg total) by mouth daily with breakfast.   FreeStyle Libre 3 Sensor Misc Change sensor topically every 14 days to monitor glucose continously.   gabapentin  100 MG capsule Commonly known as: NEURONTIN  Take 1 capsule (100 mg total) by mouth at bedtime. What changed:  when to take this reasons to take this   Mens 50+ Multi Vitamin/Min Tabs Take 1 tablet by mouth daily.   metFORMIN  1000 MG tablet Commonly known as: GLUCOPHAGE  Take 1 tablet (1,000 mg total) by mouth 2 (two) times daily with a meal.   midodrine  5 MG tablet Commonly known as: PROAMATINE  Take 1 tablet (5 mg total) by mouth 3 (three) times daily with meals.   nitroGLYCERIN  0.4 MG SL tablet Commonly known as: NITROSTAT  Place 1 tablet (0.4 mg total) under the tongue every 5 (five) minutes as needed for chest pain.   pantoprazole  40 MG tablet Commonly known as: PROTONIX  Take 1 tablet (40 mg total) by mouth daily. What  changed:  when to take this reasons to take this   rOPINIRole  2 MG tablet Commonly known as: REQUIP  Take 2 tablets (4 mg total) by mouth at bedtime.   rosuvastatin  40 MG tablet Commonly known as: CRESTOR  TAKE 1 TABLET(40 MG) BY MOUTH DAILY   Tresiba FlexTouch 100 UNIT/ML FlexTouch Pen Generic drug: insulin  degludec Inject 12 Units into the skin daily.   Trulicity  3 MG/0.5ML Soaj Generic drug: Dulaglutide  Inject 3 mg into the skin once a week. What changed: when to take this   VITAMIN B-12 PO Take 1 tablet by mouth daily.        Follow-up Information     [facility] Follow up.   Specialty: Home Health Services Why: Home Health Contact information: [address] [place] [address] [phone]                Allergies  Allergen Reactions   Lipitor [Atorvastatin] Other (See Comments)    Myalgias    Levaquin [Levofloxacin] Rash   Niaspan [Niacin] Hives, Rash and Other (See Comments)    Flushing    Ultram [Tramadol] Rash    Consultations: None   Procedures/Studies: CT CHEST ABDOMEN PELVIS W CONTRAST Result Date: 10/15/2023  CLINICAL DATA:  83 year old male with multiple recent falls. Weakness. EXAM: CT CHEST, ABDOMEN, AND PELVIS WITH CONTRAST TECHNIQUE: Multidetector CT imaging of the chest, abdomen and pelvis was performed following the standard protocol during bolus administration of intravenous contrast. RADIATION DOSE REDUCTION: This exam was performed according to the departmental dose-optimization program which includes automated exposure control, adjustment of the mA and/or kV according to patient size and/or use of iterative reconstruction technique. CONTRAST:  75mL OMNIPAQUE  IOHEXOL  350 MG/ML SOLN COMPARISON:  Chest CT 05/27/2023.  CT Abdomen and Pelvis 03/15/2023. FINDINGS: CT CHEST FINDINGS Cardiovascular: Calcified aortic atherosclerosis. Calcified coronary artery atherosclerosis is extensive. Heart size remains normal. No  pericardial effusion. No mediastinal vascular injury identified. Mediastinum/Nodes: Negative for mediastinal hematoma, mass, lymphadenopathy. Lungs/Pleura: Trace retained secretions in the trachea, carina, and mild secretions in the left mainstem bronchus. But otherwise the major airways are patent. Somewhat hyperinflated lungs, some background evidence of chronic pulmonary emphysema. Chronic lower lobe and especially costophrenic angle subpleural lung scarring. No pneumothorax, pleural effusion, pulmonary contusion. Scattered small calcified granulomas in the lungs, benign. No acute or suspicious lung nodule. Musculoskeletal: Thoracic hyperostosis resulting in some levels of thoracic interbody ankylosis from T4 through T10. Stable thoracic spine. No acute osseous abnormality identified. CT ABDOMEN PELVIS FINDINGS Hepatobiliary: Chronic cholecystectomy. Liver appears intact. No perihepatic fluid. Pancreas: Atrophied. Spleen: Spleen appears intact.  No perisplenic fluid. Adrenals/Urinary Tract: Normal adrenal glands. Normal renal enhancement and early contrast excretion. Diminutive ureters. Diminutive and unremarkable bladder. Stomach/Bowel: Similar large bowel retained stool and redundancy. More retained gas than stool in the transverse colon now. No large bowel inflammation. Decompressed terminal ileum. Cecum on a lax mesentery. Diminutive and normal appendix on coronal image 37. No dilated small bowel. Mild gas and fluid in the stomach. Negative duodenum. No pneumoperitoneum, free fluid, mesenteric inflammation identified. Vascular/Lymphatic: Extensive Aortoiliac calcified atherosclerosis. Normal caliber abdominal aorta. Major arterial structures in the abdomen and pelvis are patent. Patent portal venous system on the delayed images. No lymphadenopathy identified. Reproductive: Chronic prostate brachytherapy. Other: No pelvis free fluid. Musculoskeletal: Chronic lumbar spine degeneration superimposed on widespread  chronic lumbar spinal fusion, multilevel decompression. Stable CT appearance since last year. Chronic bilateral iliac cortical bone screws. Sacrum, SI joints, pelvis, and proximal femurs appear stable and intact. IMPRESSION: No acute traumatic injury identified in the Chest, Abdomen, or Pelvis. Aortic Atherosclerosis (ICD10-I70.0). Emphysema (ICD10-J43.9). Chronic spine surgery and degeneration. Electronically Signed   By: VEAR Hurst M.D.   On: 10/15/2023 08:45   CT Cervical Spine Wo Contrast Result Date: 10/15/2023 CLINICAL DATA:  83 year old male with multiple recent falls. Weakness. EXAM: CT CERVICAL SPINE WITHOUT CONTRAST TECHNIQUE: Multidetector CT imaging of the cervical spine was performed without intravenous contrast. Multiplanar CT image reconstructions were also generated. RADIATION DOSE REDUCTION: This exam was performed according to the departmental dose-optimization program which includes automated exposure control, adjustment of the mA and/or kV according to patient size and/or use of iterative reconstruction technique. COMPARISON:  Recent cervical spine CT 10/06/2023. Head and face CT today.  Face CT and cervical spine CT 02/21/2023. Cervical spine MRI 03/18/2023. FINDINGS: Alignment: Chronic straightening and mild reversal of cervical lordosis is stable from last year. Cervicothoracic junction alignment is within normal limits. Bilateral posterior element alignment is within normal limits. Skull base and vertebrae: Bone mineralization is within normal limits for age. Visualized skull base is intact. No atlanto-occipital dissociation. C1 and C2 appear intact and aligned. Advanced cervical spine degeneration, detailed below. Nonacute nondisplaced fracture of left C4 superior articulating  facet degenerative osteophyte (series 10, image 39), new from last year but stable from the recent CT, age indeterminate and appears inconsequential. No acute osseous abnormality identified. Soft tissues and spinal  canal: No prevertebral fluid or swelling. No visible canal hematoma. Stable noncontrast neck soft tissues with calcified atherosclerosis and blood pool hypodensity. Disc levels: Chronically advanced cervical spine degeneration superimposed on degenerative C2-C3 facet ankylosis on the left. Additional mid and upper cervical facet arthropathy, including left C4 level with discontinuous osteophyte as detailed above. Associated chronic degenerative anterolisthesis of C3 on C4. And CT evidence of chronic multilevel cervical spinal stenosis, stable from last year. See also cervical spine MRI 03/18/2023. Upper chest: Chest CT today reported separately. Negative visible noncontrast thoracic inlet. IMPRESSION: 1. No acute traumatic injury identified in the cervical spine. 2. Chronically advanced cervical spine degeneration. Chronic facet arthropathy, and multilevel spinal stenosis as demonstrated on cervical spine MRI 03/18/2023 (please see that report). 3. Chronic blood pool hypodensity in the neck, query Anemia. Calcified carotid atherosclerosis. Electronically Signed   By: VEAR Hurst M.D.   On: 10/15/2023 08:36   CT Maxillofacial Wo Contrast Result Date: 10/15/2023 CLINICAL DATA:  83 year old male with multiple recent falls. Weakness. EXAM: CT MAXILLOFACIAL WITHOUT CONTRAST TECHNIQUE: Multidetector CT imaging of the maxillofacial structures was performed. Multiplanar CT image reconstructions were also generated. RADIATION DOSE REDUCTION: This exam was performed according to the departmental dose-optimization program which includes automated exposure control, adjustment of the mA and/or kV according to patient size and/or use of iterative reconstruction technique. COMPARISON:  CT head and cervical spine today.  Face CT 02/21/2023. FINDINGS: Osseous: Mandible intact and normally located. Mild progression of carious dentition since last year. Bilateral maxilla, zygoma, pterygoid, nasal bones appear stable and intact. Central  skull base and cervical vertebrae appear aligned. Cervical spine detailed separately. Orbits: Intact orbital walls. Postoperative changes to the globes, orbits soft tissues appear stable and negative. Sinuses: Clear. Soft tissues: Negative visible noncontrast larynx, pharynx, parapharyngeal spaces, retropharyngeal space, sublingual space, submandibular glands, masticator and parotid spaces. Calcified carotid atherosclerosis and blood pool hypodensity is chronic. Limited intracranial: Calcified atherosclerosis at the skull base. Stable brain to that the tail separately today. IMPRESSION: No acute traumatic injury identified in the Face. Electronically Signed   By: VEAR Hurst M.D.   On: 10/15/2023 08:32   CT Head Wo Contrast Result Date: 10/15/2023 CLINICAL DATA:  83 year old male with multiple recent falls. Weakness. EXAM: CT HEAD WITHOUT CONTRAST TECHNIQUE: Contiguous axial images were obtained from the base of the skull through the vertex without intravenous contrast. RADIATION DOSE REDUCTION: This exam was performed according to the departmental dose-optimization program which includes automated exposure control, adjustment of the mA and/or kV according to patient size and/or use of iterative reconstruction technique. COMPARISON:  Face and cervical spine CT today reported separately. Head CT 10/06/2023. Brain MRI 03/14/2023. FINDINGS: Brain: Stable cerebral volume from last year. No midline shift, ventriculomegaly, mass effect, evidence of mass lesion, intracranial hemorrhage or evidence of cortically based acute infarction. Stable gray-white differentiation, basal ganglia vascular calcifications. Vascular: Calcified atherosclerosis at the skull base. No suspicious intracranial vascular hyperdensity. Skull: Stable and intact. Sinuses/Orbits: Visualized paranasal sinuses and mastoids are stable and well aerated. Other: Broad-based posterior convexity, occipital scalp hematoma has regressed. No new orbit or scalp  soft tissue abnormality identified. IMPRESSION: 1. No acute intracranial abnormality or acute traumatic injury identified. 2. Regressed posterior scalp hematoma since 10/06/2023. Electronically Signed   By: VEAR Hurst M.D.   On:  10/15/2023 08:29   DG Chest Port 1 View Result Date: 10/06/2023 CLINICAL DATA:  Weakness.  Frequent falls. EXAM: PORTABLE CHEST 1 VIEW COMPARISON:  03/20/2023 FINDINGS: Normal sized heart. Clear lungs with normal vascularity. Tortuous and partially calcified thoracic aorta. Thoracic and lumbar spine degenerative changes and lumbar spine fixation hardware. Cholecystectomy clips. IMPRESSION: No acute abnormality. Electronically Signed   By: Elspeth Bathe M.D.   On: 10/06/2023 16:53   CT Cervical Spine Wo Contrast Result Date: 10/06/2023 EXAM: CT CERVICAL SPINE WITHOUT CONTRAST 10/06/2023 04:43:03 PM TECHNIQUE: CT of the cervical spine was performed without the administration of intravenous contrast. Multiplanar reformatted images are provided for review. Automated exposure control, iterative reconstruction, and/or weight based adjustment of the mA/kV was utilized to reduce the radiation dose to as low as reasonably achievable. COMPARISON: CT of the cervical spine 03/14/2023. CLINICAL HISTORY: Polytrauma, blunt. Weakness x months; Frequent falls; Hit head a few days ago; Not eating well. FINDINGS: CERVICAL SPINE: BONES AND ALIGNMENT: No acute fracture or traumatic malalignment. Slight degenerative anterolisthesis at C3-4 and C4-5 is stable. Straightening of the normal cervical lordosis is stable. Ankylosis is present across the posterior elements on the left at C2-3. DEGENERATIVE CHANGES: Moderate facet degenerative changes are worse on the left at C3-4 and worse on the right at C4-5. SOFT TISSUES: No prevertebral soft tissue swelling. Atherosclerotic calcifications are present at the carotid bifurcations bilaterally without definite stenosis. IMPRESSION: 1. No acute abnormality of the  cervical spine related to the provided clinical history. 2. Stable slight degenerative anterolisthesis at C3-4 and C4-5, straightening of the normal cervical lordosis, and ankylosis across the left posterior elements of C2-3. Electronically signed by: Lonni Necessary MD 10/06/2023 04:50 PM EDT RP Workstation: [id number]   CT Head Wo Contrast Result Date: 10/06/2023 EXAM: CT HEAD WITHOUT CONTRAST 10/06/2023 04:43:03 PM TECHNIQUE: CT of the head was performed without the administration of intravenous contrast. Automated exposure control, iterative reconstruction, and/or weight based adjustment of the mA/kV was utilized to reduce the radiation dose to as low as reasonably achievable. COMPARISON: 03/14/2023 CLINICAL HISTORY: Polytrauma, blunt. Weakness for months, frequent falls, hit head a few days ago, not eating well. FINDINGS: BRAIN AND VENTRICLES: No acute hemorrhage. Gray-white differentiation is preserved. No hydrocephalus. No extra-axial collection. No mass effect or midline shift. Mild atrophy and white matter changes are stable. ORBITS: Bilateral lens replacements are noted. The globes and orbits are otherwise within normal limits. SINUSES: No acute abnormality. SOFT TISSUES AND SKULL: Occipital scalp hematoma extends to the vertex. No underlying fracture or foreign body is present. No other significant intracranial soft tissue injury is present. IMPRESSION: 1. No acute intracranial abnormality. 2. Occipital scalp hematoma extending to the vertex, without underlying fracture or foreign body. Electronically signed by: Lonni Necessary MD 10/06/2023 04:47 PM EDT RP Workstation: [id number]     Subjective: No complaints  Discharge Exam: Vitals:   10/16/23 1710 10/16/23 2038  BP:  132/71  Pulse:  79  Resp:  17  Temp:  98.2 F (36.8 C)  SpO2: 99% 98%   Vitals:   10/16/23 0919 10/16/23 1702 10/16/23 1710 10/16/23 2038  BP: (!) 152/68 (!) 166/70  132/71  Pulse: 74 73  79  Resp: 18 18  17    Temp: 98 F (36.7 C) 98.3 F (36.8 C)  98.2 F (36.8 C)  TempSrc:    Oral  SpO2: 100% 100% 99% 98%  Weight:      Height:        General: Pt is  alert, awake, not in acute distress Cardiovascular: RRR, S1/S2 +, no rubs, no gallops Respiratory: CTA bilaterally, no wheezing, no rhonchi Abdominal: Soft, NT, ND, bowel sounds + Extremities: no edema, no cyanosis    The results of significant diagnostics from this hospitalization (including imaging, microbiology, ancillary and laboratory) are listed below for reference.     Microbiology: No results found for this or any previous visit (from the past 240 hours).   Labs: BNP (last 3 results) No results for input(s): BNP in the last 8760 hours. Basic Metabolic Panel: Recent Labs  Lab 10/15/23 0633 10/16/23 0605 10/17/23 0458  NA 138 141 141  K 4.2 4.4 4.2  CL 100 107 106  CO2 28 25 27   GLUCOSE 90 102* 130*  BUN 31* 17 9  CREATININE 1.66* 1.06 0.82  CALCIUM  9.3 8.8* 8.6*   Liver Function Tests: Recent Labs  Lab 10/15/23 0633 10/16/23 0605  AST 19 25  ALT 15 16  ALKPHOS 60 55  BILITOT 0.9 0.9  PROT 6.6 5.8*  ALBUMIN  3.5 3.1*   No results for input(s): LIPASE, AMYLASE in the last 168 hours. No results for input(s): AMMONIA in the last 168 hours. CBC: Recent Labs  Lab 10/15/23 0633 10/16/23 0605 10/17/23 0458  WBC 6.7 6.5 5.1  NEUTROABS 4.2  --   --   HGB 11.6* 10.5* 10.6*  HCT 35.4* 32.9* 32.1*  MCV 94.7 96.2 93.6  PLT 239 217 194   Cardiac Enzymes: No results for input(s): CKTOTAL, CKMB, CKMBINDEX, TROPONINI in the last 168 hours. BNP: Invalid input(s): POCBNP CBG: Recent Labs  Lab 10/15/23 1022 10/15/23 1419 10/15/23 2150  GLUCAP 68* 133* 144*   D-Dimer No results for input(s): DDIMER in the last 72 hours. Hgb A1c No results for input(s): HGBA1C in the last 72 hours. Lipid Profile No results for input(s): CHOL, HDL, LDLCALC, TRIG, CHOLHDL, LDLDIRECT in the  last 72 hours. Thyroid  function studies No results for input(s): TSH, T4TOTAL, T3FREE, THYROIDAB in the last 72 hours.  Invalid input(s): FREET3 Anemia work up No results for input(s): VITAMINB12, FOLATE, FERRITIN, TIBC, IRON, RETICCTPCT in the last 72 hours. Urinalysis    Component Value Date/Time   COLORURINE YELLOW 10/15/2023 1209   APPEARANCEUR CLEAR 10/15/2023 1209   LABSPEC >1.046 (H) 10/15/2023 1209   PHURINE 5.0 10/15/2023 1209   GLUCOSEU NEGATIVE 10/15/2023 1209   HGBUR NEGATIVE 10/15/2023 1209   BILIRUBINUR NEGATIVE 10/15/2023 1209   KETONESUR NEGATIVE 10/15/2023 1209   PROTEINUR NEGATIVE 10/15/2023 1209   NITRITE NEGATIVE 10/15/2023 1209   LEUKOCYTESUR NEGATIVE 10/15/2023 1209   Sepsis Labs Recent Labs  Lab 10/15/23 0633 10/16/23 0605 10/17/23 0458  WBC 6.7 6.5 5.1   Microbiology No results found for this or any previous visit (from the past 240 hours).   Time coordinating discharge: Over 35 minutes  SIGNED:   Erle Odell Castor, MD  [facility] 10/17/2023, 7:22 AM Pager   If 7PM-7AM, please contact night-coverage [email or web address] Password [email or web address]

## 2023-10-17 NOTE — TOC Transition Note (Signed)
 Transition of Care [facility]) - Discharge Note   Patient Details  Name: Harry Zamora MRN: [id number] Date of Birth: 27-Jan-1941  [facility] CM/SW Contact:  Robynn Eileen Hoose, RN Phone Number: 10/17/2023, 7:33 AM   Clinical Narrative:     Patient is being discharged today. Cory with [facility] made aware.  Final next level of care: Home w Home Health Services Barriers to Discharge: No Barriers Identified   Patient Goals and CMS Choice            Discharge Placement                       Discharge Plan and Services Additional resources added to the After Visit Summary for                                       Social Drivers of Health (SDOH) Interventions SDOH Screenings   Food Insecurity: No Food Insecurity (10/15/2023)  Housing: Low Risk  (10/15/2023)  Transportation Needs: No Transportation Needs (10/15/2023)  Utilities: Not At Risk (10/15/2023)  Depression (PHQ2-9): Low Risk  (04/13/2023)  Social Connections: Unknown (10/15/2023)  Tobacco Use: High Risk (10/15/2023)     Readmission Risk Interventions     No data to display

## 2023-10-17 NOTE — Progress Notes (Signed)
 Physical Therapy Treatment Patient Details Name: Harry Zamora MRN: [id number] DOB: 1940/11/12 Today's Date: 10/17/2023   History of Present Illness Harry Zamora is a 83 y.o. male who presented to [facility] ED 10/15/23 s/p two falls within 20 minutes. Pt reports his legs gave out and he hit his head. Imaging negative for acute traumatic injury. No PMHx on file.    PT Comments  Continuing work on functional mobility and activity tolerance;  Interesting session today; entire session conducted with ted hose and abdominal binder; Supine BP 156/84 HR 70; initial standing BP 83/50 HR94; reports no symptoms, not even ears ringing; we continued with VERY close monitoring, and used the rollator to allow him to sit anytime; his standing BPs (intermittently taken while walking the hallways) ranged 87/61--->93/55 --->96/62, HR 108; RN aware, and notified Dr. Odell via secure chat; more details in doc flowsheets;  We discussed with pt the need to self-monitor for activity tolerance, and to sit before pre-syncopal symptoms become more pronounced;   My concern is that he still had this big drop from supine to standing, and isn't symptomatic; His wife was present, helpful; she appropriately noted that she doesn't notice he needs to sit down until his outward signs are tremors and glassy eyed, and he falls; She would like another night inpatient, which is not unreasonable    If plan is discharge home, recommend the following: A little help with walking and/or transfers;A little help with bathing/dressing/bathroom;Assistance with cooking/housework;Assist for transportation;Help with stairs or ramp for entrance   Can travel by private vehicle        Equipment Recommendations  None recommended by PT (Pt already has DME)    Recommendations for Other Services       Precautions / Restrictions Precautions Precautions: Fall Recall of Precautions/Restrictions: Impaired Precaution/Restrictions Comments: when BP drop, pt  typically has sensation of mild tightness/soreness across his neck and out toward his shoulders, and reports ringing in ears as a sign also Required Braces or Orthoses: Other Brace Other Brace: abdominal binder, TED hose Restrictions Weight Bearing Restrictions Per Provider Order: No     Mobility  Bed Mobility Overal bed mobility: Needs Assistance Bed Mobility: Supine to Sit     Supine to sit: Supervision, HOB elevated, Used rails          Transfers Overall transfer level: Needs assistance Equipment used: Rollator (4 wheels) Transfers: Sit to/from Stand Sit to Stand: Contact guard assist           General transfer comment: cues for hand placement during sit/stand, stand/sit; overall using the rollaotr and managing brakes use well    Ambulation/Gait Ambulation/Gait assistance: Contact guard assist, +2 safety/equipment Gait Distance (Feet): 300 Feet (with intermittent standing breaks for standing BPs) Assistive device: Rollator (4 wheels) Gait Pattern/deviations: Step-through pattern       General Gait Details: VERY close watch of activity tolerance   Stairs             Wheelchair Mobility     Tilt Bed    Modified Rankin (Stroke Patients Only)       Balance     Sitting balance-Leahy Scale: Good       Standing balance-Leahy Scale: Poor Standing balance comment: Dependent on UE support                            Communication Communication Communication: No apparent difficulties  Cognition Arousal: Alert Behavior During Therapy: [facility]  for tasks assessed/performed   PT - Cognitive impairments: No apparent impairments                       PT - Cognition Comments: Pt A,Ox4 Following commands: Intact      Cueing Cueing Techniques: Verbal cues  Exercises      General Comments General comments (skin integrity, edema, etc.): Discussed with pt and wife ways to don compression stockings; Reviewed some symptoms of BP drop  that pt had reported to OT: ears ringing and neck and shoulder tightness; Pt did not report any symptoms      Pertinent Vitals/Pain Pain Assessment Pain Assessment: No/denies pain    Home Living                          Prior Function            PT Goals (current goals can now be found in the care plan section) Acute Rehab PT Goals Patient Stated Goal: Return Home and stop falling PT Goal Formulation: With patient/family Time For Goal Achievement: 10/29/23 Potential to Achieve Goals: Good Progress towards PT goals: Progressing toward goals    Frequency    Min 2X/week      PT Plan      Co-evaluation              AM-PAC PT 6 Clicks Mobility   Outcome Measure  Help needed turning from your back to your side while in a flat bed without using bedrails?: None Help needed moving from lying on your back to sitting on the side of a flat bed without using bedrails?: A Little Help needed moving to and from a bed to a chair (including a wheelchair)?: A Little Help needed standing up from a chair using your arms (e.g., wheelchair or bedside chair)?: A Little Help needed to walk in hospital room?: A Little Help needed climbing 3-5 steps with a railing? : A Little 6 Click Score: 19    End of Session Equipment Utilized During Treatment: Gait belt Activity Tolerance: Patient tolerated treatment well Patient left: in chair;with call bell/phone within reach;with chair alarm set Nurse Communication: Mobility status;Other (comment) (BP response) PT Visit Diagnosis: Difficulty in walking, not elsewhere classified (R26.2);Unsteadiness on feet (R26.81);History of falling (Z91.81);Repeated falls (R29.6)     Time: 1012-1050 PT Time Calculation (min) (ACUTE ONLY): 38 min  Charges:    $Gait Training: 23-37 mins $Therapeutic Activity: 8-22 mins PT General Charges $$ ACUTE PT VISIT: 1 Visit                     Silvano Currier, PT  Acute Rehabilitation  Services Office [phone] Secure Chat welcomed    Silvano VEAR Currier 10/17/2023, 12:32 PM

## 2023-10-17 NOTE — Progress Notes (Signed)
 Occupational Therapy Treatment Patient Details Name: Harry Zamora MRN: [id number] DOB: 1940-04-18 Today's Date: 10/17/2023   History of present illness Harry Zamora is a 83 y.o. male who presented to [facility] ED 10/15/23 s/p two falls within 20 minutes. Pt reports his legs gave out and he hit his head. Imaging negative for acute traumatic injury. No PMHx on file.   OT comments  Pt. Seen for skilled OT treatment session.  Pt. Able to complete bed mobility with S.  Seated eob for LB  dressing task with S. Edcuation/handouts provided on orthostatic hypotension.  Pt. Verbalized understanding and was able to state signs/symptoms he may experience that he needs to sit down.  Ie: tightness around neck and shoulders along with ringing in the ears.  RN present for duration of session and aware of BPs listed below.  Very Minor improvement with abdominal binder, order for TED hose also placed.  Cont. With acute OT POC.    BPs:  (Without abdominal binder)  Supine: 141/77 (96) Sit: 86/54 (64) Stand: 67/48 (56)  (With abdonminal binder)  Sit: 102/63 (77) Stand: 82/56 (64)      If plan is discharge home, recommend the following:  A little help with walking and/or transfers;A little help with bathing/dressing/bathroom;Assistance with cooking/housework;Assist for transportation;Help with stairs or ramp for entrance   Equipment Recommendations  None recommended by OT    Recommendations for Other Services      Precautions / Restrictions Precautions Precautions: Fall Precaution/Restrictions Comments: when BP drop, pt typically has sensation of mild tightness/soreness across his neck and out toward his shoulders, and reports ringing in ears as a sign also Other Brace: abdominal binder       Mobility Bed Mobility Overal bed mobility: Needs Assistance Bed Mobility: Supine to Sit     Supine to sit: Supervision, HOB elevated, Used rails     General bed mobility comments: seated eob with rn present  at end of session    Transfers Overall transfer level: Needs assistance Equipment used: Rolling walker (2 wheels) Transfers: Sit to/from Stand Sit to Stand: Contact guard assist           General transfer comment: cues for hand placement during sit/stand, stand/sit     Balance                                           ADL either performed or assessed with clinical judgement   ADL Overall ADL's : Needs assistance/impaired                     Lower Body Dressing: Supervision/safety;Sitting/lateral leans Lower Body Dressing Details (indicate cue type and reason): able to figure four, reviewed no bending forward or attempting in standing for fall prevention.  also reviewed pre loading garments for energy conservation and limiting sit/stands               General ADL Comments: orthostatic hypotension handout provided and reviewed with pt., he verbalized understanding and was able to state he sometimes hears ringing in his ears as a symptom.  reviewed sitting asap when he gets any changes or signs    Extremity/Trunk Assessment              Vision       Perception     Praxis     Communication Communication Communication: No apparent difficulties  Cognition Arousal: Alert Behavior During Therapy: WFL for tasks assessed/performed Cognition: No apparent impairments             OT - Cognition Comments: AAOx4 and pleasant throughout session. Cognition WFL for tasks assessed.                 Following commands: Intact        Cueing   Cueing Techniques: Verbal cues  Exercises      Shoulder Instructions       General Comments      Pertinent Vitals/ Pain       Pain Assessment Pain Assessment: No/denies pain  Home Living                                          Prior Functioning/Environment              Frequency  Min 2X/week        Progress Toward Goals  OT Goals(current goals can  now be found in the care plan section)  Progress towards OT goals: Progressing toward goals     Plan      Co-evaluation                 AM-PAC OT 6 Clicks Daily Activity     Outcome Measure   Help from another person eating meals?: None Help from another person taking care of personal grooming?: A Little Help from another person toileting, which includes using toliet, bedpan, or urinal?: A Little Help from another person bathing (including washing, rinsing, drying)?: A Little Help from another person to put on and taking off regular upper body clothing?: A Little Help from another person to put on and taking off regular lower body clothing?: A Little 6 Click Score: 19    End of Session Equipment Utilized During Treatment: Rolling walker (2 wheels);Other (comment) (abdominal binder)  OT Visit Diagnosis: Unsteadiness on feet (R26.81);Other abnormalities of gait and mobility (R26.89);Repeated falls (R29.6);History of falling (Z91.81);Muscle weakness (generalized) (M62.81)   Activity Tolerance Patient tolerated treatment well   Patient Left Other (comment) (seated eob with rn present giving morning meds)   Nurse Communication Other (comment) (rn present and aware of BPs, she assisted with placing order to TED hose)        Time: 9094-9078 OT Time Calculation (min): 16 min  Charges: OT General Charges $OT Visit: 1 Visit OT Treatments $Self Care/Home Management : 8-22 mins  Randall, COTA/L Acute Rehabilitation [phone]   CHRISTELLA Nest Lorraine-COTA/L  10/17/2023, 9:33 AM

## 2023-10-17 NOTE — Progress Notes (Signed)
 Physical Therapy Treatment Patient Details Name: Harry Zamora MRN: [id number] DOB: 1940/10/23 Today's Date: 10/17/2023   History of Present Illness ARSALAN Zamora is a 83 y.o. male who presented to [facility] ED 10/15/23 s/p two falls within 20 minutes. Pt reports his legs gave out and he hit his head. Imaging negative for acute traumatic injury. No PMHx on file.    PT Comments  Continuing work on functional mobility and activity tolerance;  Session focused on safety with progressive amb in prep for dc home, and we opted to add bil LE ace wraps in addition to wearing the abdominal binder; Lambert showed better self-monitor for activity tolerance, and he and his wife are more confident about getting home; BPs as follows:   Supine      157/91 HR 67 Sitting        118/84 HR 64 Standing       96/58  HR 87 Standing during hallway walk                      82/49 HR 98 Standing post hallway walk                       88/53 HR 101 Still standing after heel raises                       99/64 HR 102   If plan is discharge home, recommend the following: A little help with walking and/or transfers;A little help with bathing/dressing/bathroom;Assistance with cooking/housework;Assist for transportation;Help with stairs or ramp for entrance   Can travel by private vehicle        Equipment Recommendations  None recommended by PT (Pt already has DME)    Recommendations for Other Services       Precautions / Restrictions Precautions Precautions: Fall Recall of Precautions/Restrictions: Impaired Precaution/Restrictions Comments: when BP drop, pt typically has sensation of mild tightness/soreness across his neck and out toward his shoulders, and reports ringing in ears as a sign also Required Braces or Orthoses: Other Brace Other Brace: abdominal binder, TED hose Restrictions Weight Bearing Restrictions Per Provider Order: No     Mobility  Bed Mobility Overal bed mobility: Needs Assistance Bed Mobility:  Supine to Sit     Supine to sit: Supervision, HOB elevated, Used rails          Transfers Overall transfer level: Needs assistance Equipment used: Rollator (4 wheels) Transfers: Sit to/from Stand Sit to Stand: Contact guard assist           General transfer comment: cues for hand placement during sit/stand, stand/sit; overall using the rollaotr and managing brakes use well    Ambulation/Gait Ambulation/Gait assistance: Contact guard assist, +2 safety/equipment Gait Distance (Feet): 250 Feet (with intermittent standing breaks for standing BPs) Assistive device: Rollator (4 wheels) Gait Pattern/deviations: Step-through pattern       General Gait Details: VERY close watch of activity tolerance   Stairs             Wheelchair Mobility     Tilt Bed    Modified Rankin (Stroke Patients Only)       Balance     Sitting balance-Leahy Scale: Good       Standing balance-Leahy Scale: Poor Standing balance comment: Dependent on UE support  Communication Communication Communication: No apparent difficulties  Cognition Arousal: Alert Behavior During Therapy: WFL for tasks assessed/performed   PT - Cognitive impairments: No apparent impairments                       PT - Cognition Comments: Pt A,Ox4 Following commands: Intact      Cueing Cueing Techniques: Verbal cues  Exercises      General Comments General comments (skin integrity, edema, etc.): Added Ace wraps from midfoot to mid thigh      Pertinent Vitals/Pain Pain Assessment Pain Assessment: No/denies pain    Home Living                          Prior Function            PT Goals (current goals can now be found in the care plan section) Acute Rehab PT Goals Patient Stated Goal: Return Home and stop falling PT Goal Formulation: With patient/family Time For Goal Achievement: 10/29/23 Potential to Achieve Goals: Good Progress  towards PT goals: Progressing toward goals    Frequency    Min 2X/week      PT Plan      Co-evaluation              AM-PAC PT 6 Clicks Mobility   Outcome Measure  Help needed turning from your back to your side while in a flat bed without using bedrails?: None Help needed moving from lying on your back to sitting on the side of a flat bed without using bedrails?: A Little Help needed moving to and from a bed to a chair (including a wheelchair)?: A Little Help needed standing up from a chair using your arms (e.g., wheelchair or bedside chair)?: A Little Help needed to walk in hospital room?: A Little Help needed climbing 3-5 steps with a railing? : A Little 6 Click Score: 19    End of Session Equipment Utilized During Treatment: Gait belt Activity Tolerance: Patient tolerated treatment well Patient left: in chair;with call bell/phone within reach;with chair alarm set Nurse Communication: Mobility status;Other (comment) (BP response) PT Visit Diagnosis: Difficulty in walking, not elsewhere classified (R26.2);Unsteadiness on feet (R26.81);History of falling (Z91.81);Repeated falls (R29.6)     Time: 8383-8359 PT Time Calculation (min) (ACUTE ONLY): 24 min  Charges:    $Gait Training: 23-37 mins PT General Charges $$ ACUTE PT VISIT: 1 Visit                     Silvano Currier, PT  Acute Rehabilitation Services Office [phone] Secure Chat welcomed    Silvano VEAR Currier 10/17/2023, 5:34 PM

## 2023-10-18 NOTE — ED Provider Notes (Signed)
 [facility] Provider Note   CSN: [id number] Arrival date & time: 10/15/23  0536     Patient presents with: Harry   [name] is a 83 y.o. male.   Here with fall, likely secondary to generalized weakness.  Him and wife state that he has been losing a lot of weight recently secondary to some dysphagia issues.  He falls often related weakness in his legs but tonight he fell twice within 20 to 30 minutes.  Was not able to get himself up.  Did not hit his head.  Denies any recent fever, abdominal pain.  No musculoskeletal pain from the fall.   Fall       Prior to Admission medications   Medication Sig Start Date End Date Taking? Authorizing Provider  acetaminophen  (TYLENOL ) 500 MG tablet Take 500-1,000 mg by mouth 2 (two) times daily as needed for headache or moderate pain (pain score 4-6).   Yes [provider]  Calcium  Carb-Cholecalciferol  (CALCIUM  + VITAMIN D3 PO) Take 1 tablet by mouth daily.   Yes [provider]  Cyanocobalamin  (VITAMIN B-12 PO) Take 1 tablet by mouth daily.   Yes [provider]  Dulaglutide  (TRULICITY ) 3 MG/0.70ML SOAJ Inject 3 mg into the skin once a week. Patient taking differently: Inject 3 mg into the skin every Wednesday. 02/03/23  Yes   DULoxetine  (CYMBALTA ) 30 MG capsule Take 1 capsule (30 mg total) by mouth daily. 03/31/23  Yes Angiulli, Toribio PARAS, PA-C  ferrous sulfate  325 (65 FE) MG tablet Take 1 tablet (325 mg total) by mouth daily with breakfast. 03/31/23  Yes Angiulli, Toribio PARAS, PA-C  gabapentin  (NEURONTIN ) 100 MG capsule Take 1 capsule (100 mg total) by mouth at bedtime. Patient taking differently: Take 100 mg by mouth at bedtime as needed (neuropathy). 03/31/23  Yes Angiulli, Toribio PARAS, PA-C  metFORMIN  (GLUCOPHAGE ) 1000 MG tablet Take 1 tablet (1,000 mg total) by mouth 2 (two) times daily with a meal. 03/31/23  Yes Angiulli, Toribio PARAS, PA-C  midodrine  (PROAMATINE ) 5 MG tablet Take 1 tablet (5 mg total)  by mouth 3 (three) times daily with meals. 08/02/23  Yes Lovorn, Megan, MD  Multiple Vitamins-Minerals (MENS 50+ MULTI VITAMIN/MIN) TABS Take 1 tablet by mouth daily.   Yes [provider]  nitroGLYCERIN  (NITROSTAT ) 0.4 MG SL tablet Place 1 tablet (0.4 mg total) under the tongue every 5 (five) minutes as needed for chest pain. 09/02/20 12/25/23 Yes Patwardhan, Manish J, MD  pantoprazole  (PROTONIX ) 40 MG tablet Take 1 tablet (40 mg total) by mouth daily. Patient taking differently: Take 40 mg by mouth daily as needed (heartburn). 03/31/23  Yes Angiulli, Toribio PARAS, PA-C  rOPINIRole  (REQUIP ) 2 MG tablet Take 2 tablets (4 mg total) by mouth at bedtime. 03/31/23  Yes Angiulli, Toribio PARAS, PA-C  rosuvastatin  (CRESTOR ) 40 MG tablet TAKE 1 TABLET(40 MG) BY MOUTH DAILY 09/10/23  Yes Patwardhan, Manish J, MD  TRESIBA FLEXTOUCH 100 UNIT/ML FlexTouch Pen Inject 12 Units into the skin daily.   Yes [provider]  aspirin  EC 81 MG tablet Take 1 tablet (81 mg total) by mouth daily. Swallow whole. 10/17/23   Odell Celinda Balo, MD  Continuous Glucose Sensor (FREESTYLE LIBRE 3 SENSOR) MISC Change sensor topically every 14 days to monitor glucose continously. 09/06/23       Allergies: Lipitor [atorvastatin], Levaquin [levofloxacin], Niaspan [niacin], and Ultram [tramadol]    Review of Systems  Updated Vital Signs BP (!) 141/76 (BP Location: Right  Arm)   Pulse 68   Temp 98.2 F (36.8 C)   Resp 19   Ht 6' 2 (1.88 m)   Wt 69 kg   SpO2 100%   BMI 19.53 kg/m   Physical Exam Vitals and nursing note reviewed.  Constitutional:      Appearance: He is well-developed. He is ill-appearing.     Comments: Cachectic appearing  HENT:     Head: Normocephalic and atraumatic.  Cardiovascular:     Rate and Rhythm: Normal rate.  Pulmonary:     Effort: Pulmonary effort is normal. No respiratory distress.  Abdominal:     General: There is no distension.  Musculoskeletal:        General: Normal range of  motion.     Cervical back: Normal range of motion.  Neurological:     Mental Status: He is alert.     (all labs ordered are listed, but only abnormal results are displayed) Labs Reviewed  CBC WITH DIFFERENTIAL/PLATELET - Abnormal; Notable for the following components:      Result Value   RBC 3.74 (*)    Hemoglobin 11.6 (*)    HCT 35.4 (*)    All other components within normal limits  COMPREHENSIVE METABOLIC PANEL WITH GFR - Abnormal; Notable for the following components:   BUN 31 (*)    Creatinine, Ser 1.66 (*)    GFR, Estimated 40 (*)    All other components within normal limits  URINALYSIS, ROUTINE W REFLEX MICROSCOPIC - Abnormal; Notable for the following components:   Specific Gravity, Urine >1.046 (*)    All other components within normal limits  COMPREHENSIVE METABOLIC PANEL WITH GFR - Abnormal; Notable for the following components:   Glucose, Bld 102 (*)    Calcium  8.8 (*)    Total Protein 5.8 (*)    Albumin  3.1 (*)    All other components within normal limits  CBC - Abnormal; Notable for the following components:   RBC 3.42 (*)    Hemoglobin 10.5 (*)    HCT 32.9 (*)    All other components within normal limits  GLUCOSE, CAPILLARY - Abnormal; Notable for the following components:   Glucose-Capillary 144 (*)    All other components within normal limits  BASIC METABOLIC PANEL WITH GFR - Abnormal; Notable for the following components:   Glucose, Bld 130 (*)    Calcium  8.6 (*)    All other components within normal limits  CBC - Abnormal; Notable for the following components:   RBC 3.43 (*)    Hemoglobin 10.6 (*)    HCT 32.1 (*)    All other components within normal limits  CBG MONITORING, ED - Abnormal; Notable for the following components:   Glucose-Capillary 68 (*)    All other components within normal limits  CBG MONITORING, ED - Abnormal; Notable for the following components:   Glucose-Capillary 133 (*)    All other components within normal limits     EKG: None  Radiology: No results found.   Procedures   Medications Ordered in the ED  0.9 %  sodium chloride  infusion (0 mLs Intravenous Stopped 10/17/23 0040)  lactated ringers  bolus 1,000 mL (0 mLs Intravenous Stopped 10/15/23 0817)  iohexol  (OMNIPAQUE ) 350 MG/ML injection 75 mL (75 mLs Intravenous Contrast Given 10/15/23 0809)  sodium chloride  0.9 % bolus 500 mL (0 mLs Intravenous Stopped 10/15/23 1237)  Medical Decision Making Amount and/or Complexity of Data Reviewed Labs: ordered. Radiology: ordered. ECG/medicine tests: ordered.  Risk Prescription drug management. Decision regarding hospitalization.   Patient appears chronically ill.  Will workup for any type of malignancy that might be causing to have decreased ability to swallow and weight loss.  Low suspicion for stroke at this time.  Patient does appear clinically dehydrated along with his malnourishment so we will get some fluids.  Care transferred pending completion of workup, reevaluation.  TOC and face-to-face orders placed anticipating discharge.     Final diagnoses:  Orthostatic dizziness  Fall, initial encounter  AKI (acute kidney injury) [facility])    ED Discharge Orders          Ordered    aspirin  EC 81 MG tablet  Daily        10/17/23 0722    Increase activity slowly        10/17/23 9277    Diet - low sodium heart healthy        10/17/23 0722    Home Health        10/15/23 0707    Face-to-face encounter (required for Medicare/Medicaid patients)       Comments: I Selinda Sias certify that this patient is under my care and that I, or a nurse practitioner or physician's assistant working with me, had a face-to-face encounter that meets the physician face-to-face encounter requirements with this patient on 10/15/2023. The encounter with the patient was in whole, or in part for the following medical condition(s) which is the primary reason for home health care (List medical  condition): malnourishment, weakness. Further orders per PCP.   10/15/23 9292               Sias Selinda, MD 10/18/23 [phone]

## 2023-11-02 ENCOUNTER — Encounter: Payer: Self-pay | Admitting: Adult Health

## 2023-11-02 ENCOUNTER — Ambulatory Visit (INDEPENDENT_AMBULATORY_CARE_PROVIDER_SITE_OTHER): Payer: Medicare PPO | Admitting: Adult Health

## 2023-11-02 VITALS — BP 121/59 | HR 75 | Ht 74.0 in | Wt 153.0 lb

## 2023-11-02 DIAGNOSIS — R296 Repeated falls: Secondary | ICD-10-CM

## 2023-11-02 DIAGNOSIS — R269 Unspecified abnormalities of gait and mobility: Secondary | ICD-10-CM | POA: Diagnosis not present

## 2023-11-02 DIAGNOSIS — R2990 Unspecified symptoms and signs involving the nervous system: Secondary | ICD-10-CM

## 2023-11-02 NOTE — Progress Notes (Signed)
 Chart reviewed, agree above plan ?

## 2023-11-02 NOTE — Progress Notes (Signed)
 PATIENT: Harry Zamora [facility] DOB: 05-19-1940  REASON FOR VISIT: follow up HISTORY FROM: patient PRIMARY NEUROLOGIST: Dr. Onita   Chief Complaint  Patient presents with   Follow-up    Pt in 19 with wife Pt here for stroke f/u Pt states weakness and numbness in right hand and arm . Pt and wife states 10 falls in last six months Pt states admitted to hospital 2 weeks ago due to fall      HISTORY OF PRESENT ILLNESS: Today 11/02/23:  Harry Zamora is a 83 y.o. male with a history of stroke status post TNK. Returns today for follow-up.  Overall he reports that he has not had any additional strokelike symptoms.  Continues to have weakness in the right hand.  Has completed physical therapy.  Does have regular follow-ups with his PCP.  Has remained on aspirin .  Crestor  for his cholesterol.  Reports that his last hemoglobin A1c was 5.5-currently on several medications.  Most recently the patient has had multiple falls.  She was there is also resulted in hospitalizations.  He has followed up with his PCP regarding the falls.  This is most likely related to orthostatic hypotension.  He is now on midodrine .  Last visit with PCP they discussed nutrition as he has had significant weight loss.  Also awaiting for speech therapy to help with dysphagia.  Returns today for an evaluation.     05/04/23: Harry Zamora is a 83 y.o. male follow-up here for strokelike symptoms status post TNK and cervical spine contusion due to fall r/t orthostatic hypotension.  He reports that he still has weakness in the right hand.  Reports that it is hard to hold objects.  He is currently in physical therapy and Occupational Therapy at home.  He states that he continues to get dizzy if he stands too fast.  He remains on aspirin  81 mg daily.  Remains on Crestor  20 mg daily.  In the hospital a referral was placed to neurosurgery for cervical spine contusion.  They report they have not been called for an appointment.  He was told in the  hospital that he no longer has to wear the collar.     CTA: 1. No intracranial large vessel occlusion. 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 2. 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 3. 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 4. Moderate stenosis at the origin of the left vertebral artery and mild stenosis at the origin of the right vertebral artery. 5. Aortic atherosclerosis.   MRI brain wo contrast: IMPRESSION: 1. No acute intracranial abnormality. 2. Findings of chronic small vessel ischemia. 3. Posterior scalp subgaleal hematoma.  MRI cervical spine wo contrast: IMPRESSION: 1. Abnormal cord signal at C3-4 and C4-5 compatible with edema/contusion. No evidence of cord hemorrhage. 2. Suspected small volume epidural hematoma in the upper cervical spine with possible PLL disruption at C2-3. 3. 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  HISTORY 83 y.o. patient with history of Anemia, Arthritis, Back pain, Depression, Disc displacement, lumbar, Gynecomastia, History of kidney stones, Hyperlipidemia, Hypertension, Hypertriglyceridemia, Insomnia, Low back pain, Lumbar radiculopathy, Lumbar stenosis, OA (osteoarthritis), Polyneuropathy in diabetes(357.2), Prostate cancer (HCC) (2003), Restless legs, Ringing in ears, RLS (restless  legs syndrome), and Type 2 diabetes mellitus (HCC).  Last known well around 11 PM when he went to the bathroom and on returning from the bathroom had a fall hit the back of his head had a small laceration in the back of the head and EMS was called with this. Patient reports that he fell right before EMS was called and a few  minutes before that was completely normal so going bilateral level history, last known well is around 11 as the fall happened around 11:15 PM. Risk benefits alternatives of TNK discussed with the patient. Patient is being followed by [facility] neurology for orthostatic hypotension and frequent falls but there has been no mention of any focal weakness in the history.  Also, no right-sided weakness or pupillary asymmetry was listed in the last exam of 01/15/2023.  MoCA score 20 during that same evaluation.   HOSPITAL COURSE Stroke like symptoms s/p TNK Cervical spinal cord contusion s/p fall due to orthostatic hypotension  CT no acute finding CTA head and neck b/l P1/P2 severe stenosis. 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 Left VA origin moderate stenosis MRI no acute infarct MRI C-spine cervical spinal cord edema/contusion at C3-5, concerning for contusion 2D Echo: LVEF 55 to 60% LDL 19 HgbA1c: 7.5 SCDs for VTE prophylaxis clopidogrel  75 mg daily prior to admission, now on ASA 81 with stable Hb. NSG consulted and recommend soft C-collar, also needs outpt follow up Ongoing aggressive stroke risk factor management Therapy recommendations:  CIR Disposition:  pending     REVIEW OF SYSTEMS: Out of a complete 14 system review of symptoms, the patient complains only of the following symptoms, and all other reviewed systems are negative.  ALLERGIES: Allergies  Allergen Reactions   Lipitor [Atorvastatin] Other (See Comments)    Myalgias    Levaquin [Levofloxacin] Rash   Niaspan [Niacin] Hives, Rash and Other (See Comments)    Flushing    Ultram [Tramadol] Rash    HOME MEDICATIONS: Outpatient Medications Prior to Visit  Medication Sig Dispense Refill   acetaminophen  (TYLENOL ) 500 MG tablet Take 500-1,000 mg by mouth 2 (two) times daily as needed for headache or moderate pain (pain score 4-6).     aspirin  EC 81 MG tablet Take 1 tablet (81 mg total) by mouth daily. Swallow whole. 30 tablet  12   Calcium  Carb-Cholecalciferol  (CALCIUM  + VITAMIN D3 PO) Take 1 tablet by mouth daily.     Continuous Glucose Sensor (FREESTYLE LIBRE 3 SENSOR) MISC Change sensor topically every 14 days to monitor glucose continously. 9 each 3   Cyanocobalamin  (VITAMIN B-12 PO) Take 1 tablet by mouth daily.     Dulaglutide  (TRULICITY ) 3 MG/0.5ML SOAJ Inject 3 mg into the skin once a week. (Patient taking differently: Inject 3 mg into the skin every Wednesday.) 12 mL 3   DULoxetine  (CYMBALTA ) 30 MG capsule Take 1 capsule (30 mg total) by mouth daily. 30 capsule 0   ferrous sulfate  325 (65 FE) MG tablet Take 1 tablet (325 mg total) by mouth daily with breakfast. 30 tablet 0   gabapentin  (NEURONTIN ) 100 MG capsule Take 1 capsule (100 mg total) by mouth at bedtime. (Patient taking differently: Take 100 mg by mouth at bedtime as needed (neuropathy).) 30 capsule 0   metFORMIN  (GLUCOPHAGE ) 1000 MG tablet Take 1 tablet (1,000 mg total) by mouth 2 (two) times daily with a meal. 60 tablet 0   midodrine  (PROAMATINE ) 5 MG tablet Take 1 tablet (5 mg total) by mouth 3 (three)  times daily with meals. 90 tablet 5   Multiple Vitamins-Minerals (MENS 50+ MULTI VITAMIN/MIN) TABS Take 1 tablet by mouth daily.     nitroGLYCERIN  (NITROSTAT ) 0.4 MG SL tablet Place 1 tablet (0.4 mg total) under the tongue every 5 (five) minutes as needed for chest pain. 30 tablet 3   pantoprazole  (PROTONIX ) 40 MG tablet Take 1 tablet (40 mg total) by mouth daily. (Patient taking differently: Take 40 mg by mouth daily as needed (heartburn).) 30 tablet 0   rOPINIRole  (REQUIP ) 2 MG tablet Take 2 tablets (4 mg total) by mouth at bedtime. 60 tablet 0   rosuvastatin  (CRESTOR ) 40 MG tablet TAKE 1 TABLET(40 MG) BY MOUTH DAILY 90 tablet 1   TRESIBA FLEXTOUCH 100 UNIT/ML FlexTouch Pen Inject 12 Units into the skin daily.     No facility-administered medications prior to visit.    PAST MEDICAL HISTORY: Past Medical History:  Diagnosis Date   Acute  respiratory failure with hypoxia (HCC)    AKI (acute kidney injury) (HCC) 03/13/2021   Anemia    as an infant   Arthritis    Back pain    Confusion 11/18/2022   Depression    Disc displacement, lumbar    Gynecomastia    History of kidney stones    Hyperlipidemia    Hypertension    no longer on medications   Hypertriglyceridemia    Insomnia    Low back pain    Lumbar radiculopathy    Lumbar stenosis    OA (osteoarthritis)    PNA (pneumonia) 03/12/2021   Polyneuropathy in diabetes(357.2)    Prostate cancer (HCC) 2003   Restless legs    Ringing in ears    RLS (restless legs syndrome)    Sepsis (HCC) 03/13/2021   Type 2 diabetes mellitus (HCC)     PAST SURGICAL HISTORY: Past Surgical History:  Procedure Laterality Date   ABDOMINAL EXPOSURE N/A 07/13/2019   Procedure: ABDOMINAL EXPOSURE;  Surgeon: Serene Gaile ORN, MD;  Location: [facility] OR;  Service: Vascular;  Laterality: N/A;  anterior approach   ANTERIOR LAT LUMBAR FUSION Right 07/13/2019   Procedure: Right Lumbar 3-4 Lumbar 4-5 Anterolateral lumbar interbody fusion;  Surgeon: Unice Pac, MD;  Location: [facility] OR;  Service: Neurosurgery;  Laterality: Right;   ANTERIOR LUMBAR FUSION N/A 07/13/2019   Procedure: Lumbar Five-Sacral One Anterior lumbar interbody fusion;  Surgeon: Unice Pac, MD;  Location: [facility] OR;  Service: Neurosurgery;  Laterality: N/A;  Anterior approach   APPLICATION OF INTRAOPERATIVE CT SCAN N/A 04/24/2021   Procedure: APPLICATION OF INTRAOPERATIVE CT SCAN;  Surgeon: Dawley, Lani BROCKS, DO;  Location: [facility] OR;  Service: Neurosurgery;  Laterality: N/A;  3C/RM 21   CHOLECYSTECTOMY     COLONOSCOPY     CORONARY STENT INTERVENTION N/A 10/01/2020   Procedure: CORONARY STENT INTERVENTION;  Surgeon: Elmira Newman PARAS, MD;  Location: [facility] INVASIVE CV LAB;  Service: Cardiovascular;  Laterality: N/A;   EYE SURGERY Bilateral    GALLBLADDER SURGERY     HEMORRHOID SURGERY  1983   LEFT HEART CATH AND CORONARY ANGIOGRAPHY N/A  09/17/2020   Procedure: LEFT HEART CATH AND CORONARY ANGIOGRAPHY;  Surgeon: Elmira Newman PARAS, MD;  Location: [facility] INVASIVE CV LAB;  Service: Cardiovascular;  Laterality: N/A;   LUMBAR LAMINECTOMY/DECOMPRESSION MICRODISCECTOMY N/A 10/02/2016   Procedure: L3 to S1 Laminectomy;  Surgeon: Unice Pac, MD;  Location: [facility] OR;  Service: Neurosurgery;  Laterality: N/A;  L3 to S1 Laminectomy   LUMBAR LAMINECTOMY/DECOMPRESSION MICRODISCECTOMY Bilateral 09/07/2019   Procedure:  Bilateral Lumbar Five - Sacral One Foraminotomy;  Surgeon: Unice Pac, MD;  Location: [facility] OR;  Service: Neurosurgery;  Laterality: Bilateral;  posterior   LUMBAR PERCUTANEOUS PEDICLE SCREW 3 LEVEL N/A 07/13/2019   Procedure: Percutaneous pedicle screw fixation from Lumbar 3 to Sacral 1;  Surgeon: Unice Pac, MD;  Location: [facility] OR;  Service: Neurosurgery;  Laterality: N/A;   MENISCUS REPAIR Right    PROSTATE SURGERY     SHOULDER SURGERY Left 01/2011   rotator cuff    FAMILY HISTORY: Family History  Problem Relation Age of Onset   Arthritis Mother    Hypertension Mother    Diabetes Mother    Heart attack Mother    Cancer Father    Stroke Neg Hx     SOCIAL HISTORY: Social History   Socioeconomic History   Marital status: Married    Spouse name: [name]   Number of children: 3   Years of education: 12   Highest education level: Not on file  Occupational History   Occupation: Chief Strategy Officer: [employer]  Tobacco Use   Smoking status: Former    Types: Cigars   Smokeless tobacco: Never   Tobacco comments:    occasional  Vaping Use   Vaping status: Never Used  Substance and Sexual Activity   Alcohol use: Yes    Comment: rarley   Drug use: No   Sexual activity: Not on file  Other Topics Concern   Not on file  Social History Narrative   Patient is married Sherry) and lives at home with his wife.Patient has three children and two- step children.Patient is working full-time.Patient has a high school  education.Patient is right-handed.Patient drinks two cups of coffee, 2-3 sodas and one cup of tea daily.   Retired    Chief Executive Officer Drivers of Corporate investment banker Strain: Not on [employer] Insecurity: No Food Insecurity (10/15/2023)   Hunger Vital Sign    Worried About Running Out of Food in the Last Year: Never true    Ran Out of Food in the Last Year: Never true  Transportation Needs: No Transportation Needs (10/15/2023)   PRAPARE - Administrator, Civil Service (Medical): No    Lack of Transportation (Non-Medical): No  Physical Activity: Not on file  Stress: Not on file  Social Connections: Unknown (10/15/2023)   Social Connection and Isolation Panel    Frequency of Communication with Friends and Family: Patient declined    Frequency of Social Gatherings with Friends and Family: Patient declined    Attends Religious Services: Patient declined    Database administrator or Organizations: Patient declined    Attends Banker Meetings: Patient declined    Marital Status: Married  Catering manager Violence: Not At Risk (10/15/2023)   Humiliation, Afraid, Rape, and Kick questionnaire    Fear of Current or Ex-Partner: No    Emotionally Abused: No    Physically Abused: No    Sexually Abused: No      PHYSICAL EXAM  Vitals:   11/02/23 1025  BP: (!) 121/59  Pulse: 75  Weight: 153 lb (69.4 kg)  Height: 6' 2 (1.88 m)   Body mass index is 19.64 kg/m.  Generalized: Well developed, in no acute distress   Neurological examination  Mentation: Alert oriented to time, place, history taking. Follows all commands speech and language fluent Cranial nerve II-XII: Pupils were equal round reactive to light. Extraocular movements were full, visual field were  full on confrontational test. Facial sensation and strength were normal. Uvula tongue midline. Head turning and shoulder shrug  were normal and symmetric. Motor: The motor testing reveals 5 over 5 strength of all 4  extremities with exception of being slightly weaker in the right upper extremity particularly in the hand. Good symmetric motor tone is noted throughout.  Sensory: Sensory testing is intact to soft touch on all 4 extremities. No evidence of extinction is noted.  Coordination: Cerebellar testing reveals good finger-nose-finger and heel-to-shin bilaterally.  Gait and station: Uses a rollator when ambulating.  Tandem gait not attempted. Reflexes: Deep tendon reflexes are symmetric and normal bilaterally.   DIAGNOSTIC DATA (LABS, IMAGING, TESTING) - I reviewed patient records, labs, notes, testing and imaging myself where available.  Lab Results  Component Value Date   WBC 5.1 10/17/2023   HGB 10.6 (L) 10/17/2023   HCT 32.1 (L) 10/17/2023   MCV 93.6 10/17/2023   PLT 194 10/17/2023      Component Value Date/Time   NA 141 10/17/2023 0458   NA 141 10/03/2020 1138   K 4.2 10/17/2023 0458   CL 106 10/17/2023 0458   CO2 27 10/17/2023 0458   GLUCOSE 130 (H) 10/17/2023 0458   BUN 9 10/17/2023 0458   BUN 27 10/03/2020 1138   CREATININE 0.82 10/17/2023 0458   CALCIUM  8.6 (L) 10/17/2023 0458   PROT 5.8 (L) 10/16/2023 0605   PROT 7.3 11/18/2022 1554   ALBUMIN  3.1 (L) 10/16/2023 0605   AST 25 10/16/2023 0605   ALT 16 10/16/2023 0605   ALKPHOS 55 10/16/2023 0605   BILITOT 0.9 10/16/2023 0605   GFRNONAA >60 10/17/2023 0458   GFRAA 56 (L) 09/05/2019 1144   Lab Results  Component Value Date   CHOL 61 03/14/2023   HDL 33 (L) 03/14/2023   LDLCALC 19 03/14/2023   TRIG 47 03/14/2023   CHOLHDL 1.8 03/14/2023   Lab Results  Component Value Date   HGBA1C 7.5 (H) 03/15/2023   Lab Results  Component Value Date   VITAMINB12 1,249 (H) 11/18/2022   Lab Results  Component Value Date   TSH 1.290 11/18/2022      ASSESSMENT AND PLAN 83 y.o. year old male  has a past medical history of Acute respiratory failure with hypoxia (HCC), AKI (acute kidney injury) (HCC) (03/13/2021), Anemia,  Arthritis, Back pain, Confusion (11/18/2022), Depression, Disc displacement, lumbar, Gynecomastia, History of kidney stones, Hyperlipidemia, Hypertension, Hypertriglyceridemia, Insomnia, Low back pain, Lumbar radiculopathy, Lumbar stenosis, OA (osteoarthritis), PNA (pneumonia) (03/12/2021), Polyneuropathy in diabetes(357.2), Prostate cancer (HCC) (2003), Restless legs, Ringing in ears, RLS (restless legs syndrome), Sepsis (HCC) (03/13/2021), and Type 2 diabetes mellitus (HCC). here with:  Stroke like symptoms s/p TNK Frequent falls Gait abnormality    Continue aspirin  81 mg daily  for secondary stroke prevention.   Discussed secondary stroke prevention measures and importance of close PCP follow up for aggressive stroke risk factor management. I have gone over the pathophysiology of stroke, warning signs and symptoms, risk factors and their management in some detail with instructions to go to the closest emergency room for symptoms of concern. HTN: BP goal <130/90.   HLD: LDL goal <70.  DMII: A1c goal<7.0. Advised patient to call PCP regarding speech therapy as they have not received a call from this yet. Encouraged him to have regular follow-ups with PCP. Certainly if he continues to have falls he is advised to discuss with PCP. FU with our office on an as-needed basis  Duwaine Russell, MSN, NP-C 11/02/2023, 10:28 AM [facility] [address] [place], [place] [address] [phone]

## 2023-11-05 ENCOUNTER — Encounter: Payer: Self-pay | Admitting: Physical Medicine and Rehabilitation

## 2023-11-05 ENCOUNTER — Encounter: Attending: Physical Medicine and Rehabilitation | Admitting: Physical Medicine and Rehabilitation

## 2023-11-05 VITALS — BP 124/68 | HR 94 | Ht 74.0 in | Wt 152.0 lb

## 2023-11-05 DIAGNOSIS — G8330 Monoplegia, unspecified affecting unspecified side: Secondary | ICD-10-CM | POA: Insufficient documentation

## 2023-11-05 DIAGNOSIS — I951 Orthostatic hypotension: Secondary | ICD-10-CM | POA: Insufficient documentation

## 2023-11-05 MED ORDER — MIDODRINE HCL 5 MG PO TABS: 5.0000 mg | ORAL_TABLET | Freq: Three times a day (TID) | ORAL | 5 refills | Status: DC

## 2023-11-05 NOTE — Progress Notes (Signed)
 Subjective:    Patient ID: Harry Zamora, male    DOB: February 22, 1941, 83 y.o.   MRN: [id number]  HPI Patient is a 83 yr old male with Cervical [place] contusion due to orthostatic hypotension, DM with neuropathy, HLD, RLS, CAD with stenting; chronic anemia; and mild neurogenic bladder Here for f/u on [place] contusion affecting RUE  Fall at beginning of month- fell straight on floor- was dizzy/lightheaded-    They restarted Midodrine -  due to drop in BP causing fall, as well as was dehydrated- hadn't been drinking enough.   Doing better since restarting midodrine  and drinking water  better.   Drinking 1.5 bottles/day.   Midodrine - taking 7-8 pm and  8am  Weakness still in R arm- RUE still numb from shoulder down.   Walks with Cane- single point cane- uses cane on R side.   Walking well other than fall-  Also using Rolator just at house or when goes walking outside the home round neighborhood.   Has lost 8-10 lbs since last saw me.  Sometimes vomit and always choking on food when eats, so not eating as much  Hx of RTC surgery with Dr Beverley on R shoulder years ago  Pain Inventory Average Pain 5 Pain Right Now 5 My pain is constant and dull  In the last 24 hours, has pain interfered with the following? General activity 6 Relation with others 4 Enjoyment of life 8 What TIME of day is your pain at its worst? morning , daytime, evening, and night Sleep (in general) Fair  Pain is worse with: inactivity, standing, and some activites Pain improves with: rest and medication Relief from Meds: 6  Family History  Problem Relation Age of Onset   Arthritis Mother    Hypertension Mother    Diabetes Mother    Heart attack Mother    Cancer Father    Stroke Neg Hx    Social History   Socioeconomic History   Marital status: Married    Spouse name: [name]   Number of children: 3   Years of education: 12   Highest education level: Not on file  Occupational History   Occupation:  Chief Strategy Officer: [employer]  Tobacco Use   Smoking status: Former    Types: Cigars   Smokeless tobacco: Never   Tobacco comments:    occasional  Vaping Use   Vaping status: Never Used  Substance and Sexual Activity   Alcohol use: Yes    Comment: rarley   Drug use: No   Sexual activity: Not on file  Other Topics Concern   Not on file  Social History Narrative   Patient is married Sherry) and lives at home with his wife.Patient has three children and two- step children.Patient is working full-time.Patient has a high school education.Patient is right-handed.Patient drinks two cups of coffee, 2-3 sodas and one cup of tea daily.   Retired    Chief Executive Officer Drivers of Corporate investment banker Strain: Not on [employer] Insecurity: No Food Insecurity (10/15/2023)   Hunger Vital Sign    Worried About Running Out of Food in the Last Year: Never true    Ran Out of Food in the Last Year: Never true  Transportation Needs: No Transportation Needs (10/15/2023)   PRAPARE - Administrator, Civil Service (Medical): No    Lack of Transportation (Non-Medical): No  Physical Activity: Not on file  Stress: Not on file  Social Connections: Unknown (10/15/2023)  Social Connection and Isolation Panel    Frequency of Communication with Friends and Family: Patient declined    Frequency of Social Gatherings with Friends and Family: Patient declined    Attends Religious Services: Patient declined    Database administrator or Organizations: Patient declined    Attends Banker Meetings: Patient declined    Marital Status: Married   Past Surgical History:  Procedure Laterality Date   ABDOMINAL EXPOSURE N/A 07/13/2019   Procedure: ABDOMINAL EXPOSURE;  Surgeon: Serene Gaile ORN, MD;  Location: [facility] OR;  Service: Vascular;  Laterality: N/A;  anterior approach   ANTERIOR LAT LUMBAR FUSION Right 07/13/2019   Procedure: Right Lumbar 3-4 Lumbar 4-5 Anterolateral lumbar interbody fusion;   Surgeon: Unice Pac, MD;  Location: [facility] OR;  Service: Neurosurgery;  Laterality: Right;   ANTERIOR LUMBAR FUSION N/A 07/13/2019   Procedure: Lumbar Five-Sacral One Anterior lumbar interbody fusion;  Surgeon: Unice Pac, MD;  Location: [facility] OR;  Service: Neurosurgery;  Laterality: N/A;  Anterior approach   APPLICATION OF INTRAOPERATIVE CT SCAN N/A 04/24/2021   Procedure: APPLICATION OF INTRAOPERATIVE CT SCAN;  Surgeon: Dawley, Lani BROCKS, DO;  Location: [facility] OR;  Service: Neurosurgery;  Laterality: N/A;  3C/RM 21   CHOLECYSTECTOMY     COLONOSCOPY     CORONARY STENT INTERVENTION N/A 10/01/2020   Procedure: CORONARY STENT INTERVENTION;  Surgeon: Elmira Newman PARAS, MD;  Location: [facility] INVASIVE CV LAB;  Service: Cardiovascular;  Laterality: N/A;   EYE SURGERY Bilateral    GALLBLADDER SURGERY     HEMORRHOID SURGERY  1983   LEFT HEART CATH AND CORONARY ANGIOGRAPHY N/A 09/17/2020   Procedure: LEFT HEART CATH AND CORONARY ANGIOGRAPHY;  Surgeon: Elmira Newman PARAS, MD;  [facility]: [facility];  Service: Cardiovascular;  Laterality: N/A;   LUMBAR LAMINECTOMY/DECOMPRESSION MICRODISCECTOMY N/A 10/02/2016   Procedure: L3 to S1 Laminectomy;  Surgeon: Unice Pac, MD;  Location: [facility] OR;  Service: Neurosurgery;  Laterality: N/A;  L3 to S1 Laminectomy   LUMBAR LAMINECTOMY/DECOMPRESSION MICRODISCECTOMY Bilateral 09/07/2019   Procedure: Bilateral Lumbar Five - Sacral One Foraminotomy;  Surgeon: Unice Pac, MD;  Location: [facility] OR;  Service: Neurosurgery;  Laterality: Bilateral;  posterior   LUMBAR PERCUTANEOUS PEDICLE SCREW 3 LEVEL N/A 07/13/2019   Procedure: Percutaneous pedicle screw fixation from Lumbar 3 to Sacral 1;  Surgeon: Unice Pac, MD;  Location: [facility] OR;  Service: Neurosurgery;  Laterality: N/A;   MENISCUS REPAIR Right    PROSTATE SURGERY     SHOULDER SURGERY Left 01/2011   rotator cuff   Past Surgical History:  Procedure Laterality Date   ABDOMINAL EXPOSURE N/A 07/13/2019   Procedure: ABDOMINAL  EXPOSURE;  Surgeon: Serene Gaile ORN, MD;  Location: [facility] OR;  Service: Vascular;  Laterality: N/A;  anterior approach   ANTERIOR LAT LUMBAR FUSION Right 07/13/2019   Procedure: Right Lumbar 3-4 Lumbar 4-5 Anterolateral lumbar interbody fusion;  Surgeon: Unice Pac, MD;  Location: [facility] OR;  Service: Neurosurgery;  Laterality: Right;   ANTERIOR LUMBAR FUSION N/A 07/13/2019   Procedure: Lumbar Five-Sacral One Anterior lumbar interbody fusion;  Surgeon: Unice Pac, MD;  Location: [facility] OR;  Service: Neurosurgery;  Laterality: N/A;  Anterior approach   APPLICATION OF INTRAOPERATIVE CT SCAN N/A 04/24/2021   Procedure: APPLICATION OF INTRAOPERATIVE CT SCAN;  Surgeon: Dawley, Lani BROCKS, DO;  Location: [facility] OR;  Service: Neurosurgery;  Laterality: N/A;  3C/RM 21   CHOLECYSTECTOMY     COLONOSCOPY     CORONARY STENT INTERVENTION N/A 10/01/2020  Procedure: CORONARY STENT INTERVENTION;  Surgeon: Elmira Newman PARAS, MD;  [facility];  Service: Cardiovascular;  Laterality: N/A;   EYE SURGERY Bilateral    GALLBLADDER SURGERY     HEMORRHOID SURGERY  1983   LEFT HEART CATH AND CORONARY ANGIOGRAPHY N/A 09/17/2020   Procedure: LEFT HEART CATH AND CORONARY ANGIOGRAPHY;  Surgeon: Elmira Newman PARAS, MD;  [facility];  Service: Cardiovascular;  Laterality: N/A;   LUMBAR LAMINECTOMY/DECOMPRESSION MICRODISCECTOMY N/A 10/02/2016   Procedure: L3 to S1 Laminectomy;  Surgeon: Unice Pac, MD;  Location[facility] OR;  Service: Neurosurgery;  Laterality: N/A;  L3 to S1 Laminectomy   LUMBAR LAMINECTOMY/DECOMPRESSION MICRODISCECTOMY Bilateral 09/07/2019   Procedure: Bilateral Lumbar Five - Sacral One Foraminotomy;  Surgeon: Unice Pac, MD;  Location: [facility] OR;  Service: Neurosurgery;  Laterality: Bilateral;  posterior   LUMBAR PERCUTANEOUS PEDICLE SCREW 3 LEVEL N/A 07/13/2019   Procedure: Percutaneous pedicle screw fixation from Lumbar 3 to Sacral 1;  Surgeon: Unice Pac, MD;  Location: [facility] OR;  Service:  Neurosurgery;  Laterality: N/A;   MENISCUS REPAIR Right    PROSTATE SURGERY     SHOULDER SURGERY Left 01/2011   rotator cuff   Past Medical History:  Diagnosis Date   Acute respiratory failure with hypoxia (HCC)    AKI (acute kidney injury) (HCC) 03/13/2021   Anemia    as an infant   Arthritis    Back pain    Confusion 11/18/2022   Depression    Disc displacement, lumbar    Gynecomastia    History of kidney stones    Hyperlipidemia    Hypertension    no longer on medications   Hypertriglyceridemia    Insomnia    Low back pain    Lumbar radiculopathy    Lumbar stenosis    OA (osteoarthritis)    PNA (pneumonia) 03/12/2021   Polyneuropathy in diabetes(357.2)    Prostate cancer (HCC) 2003   Restless legs    Ringing in ears    RLS (restless legs syndrome)    Sepsis (HCC) 03/13/2021   Type 2 diabetes mellitus (HCC)    BP 124/68   Pulse 94   Ht 6' 2 (1.88 m)   Wt 152 lb (68.9 kg)   SpO2 98%   BMI 19.52 kg/m   Opioid Risk Score:   Fall Risk Score:  `1  Depression screen [facility] 2/9     04/13/2023    9:48 AM  Depression screen PHQ 2/9  Decreased Interest 1  Down, Depressed, Hopeless 0  PHQ - 2 Score 1  Altered sleeping 0  Tired, decreased energy 0  Change in appetite 0  Feeling bad or failure about yourself  0  Trouble concentrating 0  Moving slowly or fidgety/restless 0  Suicidal thoughts 0  PHQ-9 Score 1    Review of Systems  Musculoskeletal:  Positive for back pain.  All other systems reviewed and are negative.      Objective:   Physical Exam  BP 124/68 today- much better Awake, alert, appropriate, using SPC to walk, more ecchymoses on arms MSK: RUE- deltoid 3+/5 with decreased ROM; biceps 4/5; triceps 4 to 4+/5; WE 4/5; Grip 4+/5 and FA 4/5   Neuro: Decreased to light touch and pinprick fro C5 to T1 on RUE No increased DTRs in RUE No hoffmans or Clonus at wrist      Assessment & Plan:  Patient is a 83 yr old male with Cervical [place]  contusion due  to orthostatic hypotension, DM with neuropathy, HLD, RLS, CAD with stenting; chronic anemia; and mild neurogenic bladder Here for f/u on [place] contusion affecting RUE  Need to drink 4 16 ox bottles of water /day- or the equivalent- just make sure drinking more than you are.    3.   Has to ask PCP why losing weight- not trying to!  They do plan to do oesophagus stretching for him. That might be why- not eating enough.   4. Not developing any  spasticity- went over with pt- I'm grateful for this.   5. Will resend in Midodrine - needs to take 5 mg with breakfast lunch and dinner- since only lasts 4 hours for each pill- sent in refills for 6 months - to keep you BP up- due to orthostatic hypotension  6. F/U in 6 months- if doing well then, see back in 1 year.    I spent a total of    minutes on total care today- >50% coordination of care- due to

## 2023-11-05 NOTE — Patient Instructions (Addendum)
 Patient is a 83 yr old male with Cervical [place] contusion due to orthostatic hypotension, DM with neuropathy, HLD, RLS, CAD with stenting; chronic anemia; and mild neurogenic bladder Here for f/u on [place] contusion affecting RUE  Need to drink 4 16 ox bottles of water /day- or the equivalent- just make sure drinking more than you are.    3.   Has to ask PCP why losing weight- not trying to!  They do plan to do oesophagus stretching for him. That might be why- not eating enough.   4. Not developing any  spasticity- went over with pt- I'm grateful for this.   5. Will resend in Midodrine - needs to take 5 mg with breakfast lunch and dinner- since only lasts 4 hours for each pill- sent in refills for 6 months   6. F/U in 6 months- if doing well then, see back in 1 year.   7. Has been discharged from PT/OT

## 2023-12-16 ENCOUNTER — Other Ambulatory Visit (HOSPITAL_BASED_OUTPATIENT_CLINIC_OR_DEPARTMENT_OTHER): Payer: Self-pay

## 2023-12-16 ENCOUNTER — Other Ambulatory Visit: Payer: Self-pay | Admitting: Physical Medicine and Rehabilitation

## 2023-12-21 ENCOUNTER — Other Ambulatory Visit (HOSPITAL_BASED_OUTPATIENT_CLINIC_OR_DEPARTMENT_OTHER): Payer: Self-pay

## 2023-12-23 ENCOUNTER — Other Ambulatory Visit (HOSPITAL_COMMUNITY): Payer: Self-pay

## 2023-12-28 ENCOUNTER — Encounter (HOSPITAL_BASED_OUTPATIENT_CLINIC_OR_DEPARTMENT_OTHER): Payer: Self-pay

## 2023-12-28 ENCOUNTER — Telehealth: Payer: Self-pay | Admitting: Adult Health

## 2023-12-28 ENCOUNTER — Other Ambulatory Visit (HOSPITAL_BASED_OUTPATIENT_CLINIC_OR_DEPARTMENT_OTHER): Payer: Self-pay

## 2023-12-28 NOTE — Telephone Encounter (Signed)
 Patient saikd right arm from elbow to hand. Cannot hold anything for a long time. Have had this symptom for a month.  Informed patient would need a referral sent over for that new symptom. Patient verbalized understand will call the doctor to send referral.

## 2024-02-01 NOTE — Progress Notes (Unsigned)
 Cardiology Office Note    Date:  02/02/2024  ID:  TOSH GLAZE, DOB 03-12-1941, MRN [id number] PCP:  Nichole Senior, MD  Cardiologist:  Newman JINNY Lawrence, MD  Electrophysiologist:  None   Chief Complaint: f/u CAD, orthostasis  History of Present Illness: .    Harry Zamora is a 83 y.o. 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   He established in 07/2020 with Dr. Lawrence for syncope. Echo showed EF 50%. Monitor showed NSR, avg 78bpm, 17 brief PSVT (longest 20 beats), 5 beat NSVT, 2.2% PVCs, rare PACs. Stress test 08/2020 was abnormal. Underwent cath 09/2020 s/p PTCA/stenting to LAD and ramus. RCA had residual 60% prox stenosis treated medically. He also has hx of distal aortic arch ulcer previously followed by Dr. Lucas - Dr. Gilda notes indicate he previously had a focal eschar on left foot 2nd toe, which could have been an atheroembolic event, likely source could be distal aortic arch ulcer. He has had abnormal ABIs but has not required PV angiography. He had a repeat monitor 10/2022 for dizziness and frequent falls showing NSR, avg 71bpm, range 56-154bpm, 15 runs PAT (longest 7 beats), <1% PACs/PVCs. He was admitted 02/2023-03/2023 with prolonged admission with fall while returning from bathroom, some question of stroke-like symptoms treated with TNK.Hospital course notable for cervical spinal cord contusion, marked orthostasis requiring midodrine , anemia, AKI. Plavix  was de-escalated to ASA. Echo 02/2023 showed EF 55-60%, moderately elevated PASP. He had another admission 10/2023 for falls without LOC with AKI/orthostasis in the  setting of decreased oral intake. Of note, myeloma panel in 11/2022 was negative for M spike.   He is seen for follow-up today with wife Harry Zamora. Overall he has been doing well. They did clarify today that he has actually not been on midodrine  for several months, including at time of last hospitalization. They thought the prescription was not renewed. He has thankfully not had any recent falls or dizzy spells. He feels he is doing well.   Orthostatic VS for the past 24 hrs (Last 3 readings):  BP- Lying Pulse- Lying BP- Sitting Pulse- Sitting BP- Standing at 0 minutes Pulse- Standing at 0 minutes BP- Standing at 3 minutes Pulse- Standing at 3 minutes  02/02/24 1356 111/63 74 119/62 83 117/62 80 118/61 86    Labwork independently reviewed: 10/2023 Hgb 10.6, plt 194, K 4.2, Cr 0.82, alb 3.1, AST ALT OK 05/2023 KPN LDL 22, trig 95, HDL 40 02/2023 LDL 192, trig 47 11/2022 TSH OK  ROS: .    Please see the history of present illness.  All other systems are reviewed and otherwise negative.  Studies Reviewed: SABRA    EKG:  EKG is ordered today, personally reviewed, demonstrating  EKG Interpretation Date/Time:  Wednesday February 02 2024 13:45:08 EST Ventricular Rate:  75 PR Interval:  172 QRS Duration:  88 QT Interval:  388 QTC Calculation: 433 R Axis:   79  Text Interpretation: Normal sinus rhythm Nonspecific T wave abnormality Confirmed by Trenden Hazelrigg [phone]) on 02/02/2024 1:57:10 PM    CV Studies: Cardiac studies reviewed are outlined and summarized above. Otherwise please see EMR for full report.   Current Reported Medications:.  Current Meds  Medication Sig   acetaminophen  (TYLENOL ) 500 MG tablet Take 500-1,000 mg by mouth 2 (two) times daily as needed for headache or moderate pain (pain score 4-6).   aspirin  EC 81 MG tablet Take 1 tablet (81 mg total) by mouth daily. Swallow whole.   Calcium  Carb-Cholecalciferol  (CALCIUM  + VITAMIN D3 PO) Take 1 tablet by mouth daily.    Continuous Glucose Sensor (FREESTYLE LIBRE 3 SENSOR) MISC Change sensor topically every 14 days to monitor glucose continously.   Cyanocobalamin  (VITAMIN B-12 PO) Take 1 tablet by mouth daily.   Dulaglutide  (TRULICITY ) 3 MG/0.5ML SOAJ Inject 3 mg into the skin once a week. (Patient taking differently: Inject 3 mg into the skin every Wednesday.)   DULoxetine  (CYMBALTA ) 30 MG capsule Take 1 capsule (30 mg total) by mouth daily.   ferrous sulfate  325 (65 FE) MG tablet Take 1 tablet (325 mg total) by mouth daily with breakfast.   gabapentin  (NEURONTIN ) 100 MG capsule Take 1 capsule (100 mg total) by mouth at bedtime. (Patient taking differently: Take 100 mg by mouth at bedtime as needed (neuropathy).)   metFORMIN  (GLUCOPHAGE ) 1000 MG tablet Take 1 tablet (1,000 mg total) by mouth 2 (two) times daily with a meal.   midodrine  (PROAMATINE ) 5 MG tablet Take 1 tablet (5 mg total) by mouth 3 (three) times daily with meals. To keep your BP UP!!!   Multiple Vitamins-Minerals (MENS 50+ MULTI VITAMIN/MIN) TABS Take 1 tablet by mouth daily.   nitroGLYCERIN  (NITROSTAT ) 0.4 MG SL tablet Place 1 tablet (0.4 mg total) under the tongue every 5 (five) minutes as needed for chest pain.   pantoprazole  (PROTONIX ) 40 MG tablet Take 1 tablet (40 mg total) by mouth daily. (Patient taking differently: Take 40 mg by mouth daily as needed (heartburn).)   rOPINIRole  (REQUIP ) 2 MG tablet Take 2 tablets (4 mg total) by mouth at bedtime.   rosuvastatin  (CRESTOR ) 40 MG tablet TAKE 1 TABLET(40 MG) BY MOUTH DAILY   TRESIBA FLEXTOUCH 100 UNIT/ML FlexTouch Pen Inject 12 Units into the skin daily. (Patient taking differently: Inject 15 Units into the skin daily.)    Physical Exam:    VS:  BP 111/63   Pulse 74   Ht 6' 2 (1.88 m)   Wt 170 lb 14.4 oz (77.5 kg)   SpO2 96%   BMI 21.94 kg/m    Wt Readings from Last 3 Encounters:  02/02/24 170 lb 14.4 oz (77.5 kg)  11/05/23 152 lb (68.9 kg)  11/02/23 153 lb (69.4 kg)    GEN: Well  nourished, well developed in no acute distress NECK: No JVD; No carotid bruits CARDIAC: RRR, no murmurs, rubs, gallops RESPIRATORY:  Clear to auscultation without rales, wheezing or rhonchi  ABDOMEN: Soft, non-tender, non-distended EXTREMITIES:  No edema; No acute deformity   Asessement and Plan:.    1. Orthostatic hypotension - has been a recurrent issue for the patient, suspect underlying autonomic neuropathy. This may be due to diabetes but patient also has constellation of issues including dysphagia, spinal stenosis and SVT that make me pause to consider cardiac amyloidosis in the differential. Myeloma panel was negative for M spike in 2024. I discussed option of continuing with medical therapy versus pursuit of evaluation with cMRI. They would prefer to pursue the evaluation for this, will arrange. Will f/u CBC, BMET today. Of note, he has been off midodrine  for at least the last 5 months or so. He had a recurrent admission 10/2023 for dehydration/orthostasis - it was  thought he was on midodrine  5mg  TID at that time but he was actually off of it then. He has since gained some weight which is likely protective. Orthostatics today were unrevealing, though general BP still running <120. However, he is still at risk for falling given his significant history of this. Per shared decision making, we will resume midodrine  at 2.5mg  TID with last dose not to be taken right before bed. Will have clinical f/u in 6 weeks to reassess.  2. CAD - doing well without accelerating angina. Continue ASA 81mg  daily, rosuvastatin  40mg  daily - note hx of rhabdo back in 2022, with repeat CK in 2024 back on statin completely normal. No myalgias. Continue.  3. PAD by ABIs, also distal aortic arch ulcer - no LE claudication present. Aortic arch ulcer is followed by Dr. Lucas, last OV 05/2023 with f/u planned 1 year.  4. PSVT - quiescent, not requiring any AVN blocking agents.  5. Moderately elevated PASP - no s/sx of HF on  exam. Follow clinically in the absences of symptoms.  6. Mild bilateral carotid disease - last assessed 2022, will defer additional follow-up unless recurrent symptoms arise.     Disposition: F/u with me in 6 weeks.  Signed, Javohn Basey N Zachrey Deutscher, PA-C

## 2024-02-02 ENCOUNTER — Ambulatory Visit: Attending: Physician Assistant | Admitting: Physician Assistant

## 2024-02-02 ENCOUNTER — Encounter: Payer: Self-pay | Admitting: Physician Assistant

## 2024-02-02 ENCOUNTER — Other Ambulatory Visit (HOSPITAL_COMMUNITY): Payer: Self-pay

## 2024-02-02 VITALS — BP 111/63 | HR 74 | Ht 74.0 in | Wt 170.9 lb

## 2024-02-02 DIAGNOSIS — I471 Supraventricular tachycardia, unspecified: Secondary | ICD-10-CM

## 2024-02-02 DIAGNOSIS — I739 Peripheral vascular disease, unspecified: Secondary | ICD-10-CM | POA: Diagnosis not present

## 2024-02-02 DIAGNOSIS — I2510 Atherosclerotic heart disease of native coronary artery without angina pectoris: Secondary | ICD-10-CM

## 2024-02-02 DIAGNOSIS — I272 Pulmonary hypertension, unspecified: Secondary | ICD-10-CM

## 2024-02-02 DIAGNOSIS — Z87898 Personal history of other specified conditions: Secondary | ICD-10-CM

## 2024-02-02 DIAGNOSIS — I951 Orthostatic hypotension: Secondary | ICD-10-CM | POA: Diagnosis not present

## 2024-02-02 DIAGNOSIS — I6523 Occlusion and stenosis of bilateral carotid arteries: Secondary | ICD-10-CM

## 2024-02-02 MED ORDER — MIDODRINE HCL 2.5 MG PO TABS: 2.5000 mg | ORAL_TABLET | Freq: Three times a day (TID) | ORAL | 3 refills | Status: DC

## 2024-02-02 NOTE — Patient Instructions (Addendum)
 Medication Instructions:  Restart midodrine  2.5 mg three times daily *If you need a refill on your cardiac medications before your next appointment, please call your pharmacy*  Lab Work: CBC, BMET-TODAY If you have labs (blood work) drawn today and your tests are completely normal, you will receive your results only by: MyChart Message (if you have MyChart) OR A paper copy in the mail If you have any lab test that is abnormal or we need to change your treatment, we will call you to review the results.  Testing/Procedures: A Cardiac MRI has been ordered to be done at the following location:  [facility] [address] [place], [place] [address] Please take advantage of the free valet parking available at the [facility] and [employer] (Entrance C).   Proceed to the [facility] Radiology Department (First Floor) for check-in.    Magnetic resonance imaging (MRI) is a painless test that produces images of the inside of the body without using Xrays.  During an MRI, strong magnets and radio waves work together in a data processing manager to form detailed images.   MRI images may provide more details about a medical condition than X-rays, CT scans, and ultrasounds can provide.  You may be given earphones to listen for instructions.  You may eat a light breakfast and take medications as ordered with the exception of furosemide, hydrochlorothiazide, chlorthalidone or spironolactone (or any other fluid pill). If you are undergoing a stress MRI, please avoid stimulants for 12 hr prior to test. (I.e. Caffeine, nicotine, chocolate, or antihistamine medications)  If your provider has ordered anti-anxiety medications for this test, then you will need a driver.  An IV will be inserted into one of your veins. Contrast material will be injected into your IV. It will leave your body through your urine within a day. You may be told to drink plenty of fluids to help flush the contrast material out  of your system.  You will be asked to remove all metal, including: Watch, jewelry, and other metal objects including hearing aids, hair pieces and dentures. Also wearable glucose monitoring systems (ie. Freestyle Libre and Omnipods) (Braces and fillings normally are not a problem.)   TEST WILL TAKE APPROXIMATELY 1 HOUR  PLEASE NOTIFY SCHEDULING AT LEAST 24 HOURS IN ADVANCE IF YOU ARE UNABLE TO KEEP YOUR APPOINTMENT. [phone]  For more information and frequently asked questions, please visit our website : [email or web address]  Please call the Cardiac Imaging Nurse Navigators with any questions/concerns. [phone] Office    Follow-Up: At [facility], you and your health needs are our priority.  As part of our continuing mission to provide you with exceptional heart care, our providers are all part of one team.  This team includes your primary Cardiologist (physician) and Advanced Practice Providers or APPs (Physician Assistants and Nurse Practitioners) who all work together to provide you with the care you need, when you need it.  Your next appointment:   Approximately 6 week(s)  Provider:   Dayna Dunn, PA-C

## 2024-02-03 ENCOUNTER — Ambulatory Visit: Payer: Self-pay | Admitting: Physician Assistant

## 2024-02-03 LAB — CBC
Hematocrit: 35.1 % — ABNORMAL LOW (ref 37.5–51.0)
Hemoglobin: 11.4 g/dL — ABNORMAL LOW (ref 13.0–17.7)
MCH: 31.9 pg (ref 26.6–33.0)
MCHC: 32.5 g/dL (ref 31.5–35.7)
MCV: 98 fL — ABNORMAL HIGH (ref 79–97)
Platelets: 195 x10E3/uL (ref 150–450)
RBC: 3.57 x10E6/uL — ABNORMAL LOW (ref 4.14–5.80)
RDW: 12.5 % (ref 11.6–15.4)
WBC: 6.3 x10E3/uL (ref 3.4–10.8)

## 2024-02-03 LAB — BASIC METABOLIC PANEL WITH GFR
BUN/Creatinine Ratio: 15 (ref 10–24)
BUN: 17 mg/dL (ref 8–27)
CO2: 22 mmol/L (ref 20–29)
Calcium: 9.4 mg/dL (ref 8.6–10.2)
Chloride: 103 mmol/L (ref 96–106)
Creatinine, Ser: 1.13 mg/dL (ref 0.76–1.27)
Glucose: 125 mg/dL — AB (ref 70–99)
Potassium: 4.5 mmol/L (ref 3.5–5.2)
Sodium: 144 mmol/L (ref 134–144)
eGFR: 64 mL/min/1.73 (ref >59)

## 2024-02-08 NOTE — Progress Notes (Signed)
 Called and spoke with patient wife regarding his recent LAB results. She stated that she will have him check results on [facility] today.

## 2024-02-16 ENCOUNTER — Encounter (HOSPITAL_COMMUNITY): Payer: Self-pay

## 2024-02-16 ENCOUNTER — Other Ambulatory Visit (HOSPITAL_COMMUNITY): Payer: Self-pay

## 2024-02-18 ENCOUNTER — Other Ambulatory Visit: Payer: Self-pay | Admitting: Physician Assistant

## 2024-02-18 ENCOUNTER — Ambulatory Visit (HOSPITAL_COMMUNITY)
Admission: RE | Admit: 2024-02-18 | Discharge: 2024-02-18 | Disposition: A | Source: Ambulatory Visit | Attending: Physician Assistant | Admitting: Physician Assistant

## 2024-02-18 DIAGNOSIS — I951 Orthostatic hypotension: Secondary | ICD-10-CM

## 2024-02-18 MED ORDER — GADOBUTROL 1 MMOL/ML IV SOLN
10.0000 mL | Freq: Once | INTRAVENOUS | Status: AC | PRN
  Administered 2024-02-18: 10 mL via INTRAVENOUS

## 2024-03-14 ENCOUNTER — Other Ambulatory Visit: Payer: Self-pay | Admitting: Cardiology

## 2024-03-15 MED ORDER — ROSUVASTATIN CALCIUM 40 MG PO TABS: 40.0000 mg | ORAL_TABLET | Freq: Every day | ORAL | 3 refills | Status: AC

## 2024-03-21 ENCOUNTER — Ambulatory Visit: Admitting: Pulmonary Disease

## 2024-03-21 ENCOUNTER — Encounter: Payer: Self-pay | Admitting: Pulmonary Disease

## 2024-03-21 VITALS — BP 170/72 | HR 68 | Temp 97.0°F | Ht 74.0 in | Wt 178.0 lb

## 2024-03-21 DIAGNOSIS — Z720 Tobacco use: Secondary | ICD-10-CM | POA: Diagnosis not present

## 2024-03-21 DIAGNOSIS — J8410 Pulmonary fibrosis, unspecified: Secondary | ICD-10-CM | POA: Diagnosis not present

## 2024-03-21 DIAGNOSIS — Z8701 Personal history of pneumonia (recurrent): Secondary | ICD-10-CM | POA: Diagnosis not present

## 2024-03-21 DIAGNOSIS — J849 Interstitial pulmonary disease, unspecified: Secondary | ICD-10-CM

## 2024-03-21 NOTE — Progress Notes (Signed)
 "  Cardiology Office Note    Date:  03/23/2024  ID:  Tel, Hevia 1940/11/29, MRN [id number] PCP:  Nichole Senior, MD  Cardiologist:  Newman JINNY Lawrence, MD  Electrophysiologist:  None   Chief Complaint: f/u orthostasis  History of Present Illness: .    Harry Zamora is a 84 y.o. 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    He established in 07/2020 with Dr. Lawrence for syncope. Echo showed EF 50%. Monitor showed NSR, avg 78bpm, 17 brief PSVT (longest 20 beats), 5 beat NSVT, 2.2% PVCs, rare PACs. Stress test 08/2020 was abnormal. Underwent cath 09/2020 s/p PTCA/stenting to LAD and ramus. RCA had residual 60% prox stenosis treated medically. He also has hx of distal aortic arch ulcer previously followed by Dr. Lucas - Dr. Gilda notes indicate he previously had a focal eschar on left foot 2nd toe, which could have been an atheroembolic event, likely source could be distal aortic arch ulcer. He has had abnormal ABIs but has not required PV angiography. He had a repeat monitor 10/2022 for dizziness and frequent falls showing NSR, avg 71bpm, range 56-154bpm, 15 runs PAT (longest 7 beats), <1% PACs/PVCs. He was admitted 02/2023-03/2023 with prolonged admission with fall while returning from bathroom, some question of stroke-like symptoms treated with TNK.Hospital course notable for cervical spinal cord contusion, marked orthostasis requiring midodrine , anemia, AKI. Plavix  was de-escalated to ASA. Of note, myeloma panel in 11/2022 was negative for M spike. Echo 02/2023 showed EF 55-60%, moderately elevated PASP. He  had another admission 10/2023 for falls without LOC with AKI/orthostasis in the setting of decreased oral intake. At follow-up 01/2024 he and his wife clarified that he had not been on midodrine  for several months, including at time of last hospitalization as planned by med list. They thought rx as not renewed. We restarted low dose at f/u 01/2024. Ordered cMRI given hx of orthostasis, neuropathy, spinal stenosis - this showed no evidence of cardiac amyloidosis but did note LVEF 44% mild global HK with prior mid-apical anterior wall infarct with viability, and normal RV chamber size with mildly reduced RVEF 44%. I discussed with Dr. Lawrence who felt that conservative approach was appropriate in the absence of CP, SOB, edema.  He returns for follow-up today with his wife. He reports he's been feeling good. He has not had any recent CP, SOB, palpitations, pre-syncope, syncope or edema. His wife notices sometimes when he stands up it takes him a moment to get the strength in his legs, but overall they are pleased. BP was 170 systolic the other day at pulm office which is very unusual compared to his baseline.   Lying BP HR 128/60 p 66 Sitting BP 118/58 HR 67 Standing 74m BP 114/50 HR 77 Standing 34m BP 98/48 HR 76 End of interview BP 102/48  Labwork independently reviewed: 01/2024 K 4.5, Cr 1.13, Hgb 11.4 plt wnl c/w prior advised to f/u PCP For monitoring of anemia 10/2023 Hgb 10.6, plt 194, K 4.2, Cr 0.82, alb 3.1, AST ALT OK 05/2023 KPN LDL 22, trig 95, HDL 40 02/2023 LDL 192, trig 47 11/2022 TSH OK  ROS: .    Please see  the history of present illness.  All other systems are reviewed and otherwise negative.  Studies Reviewed: SABRA    EKG:  EKG not ordered today  CV Studies: Cardiac studies reviewed are outlined and summarized above. Otherwise please see EMR for full report.   Current Reported Medications:.    Active Medications[1]  Physical Exam:    VS:  BP (!) 118/58   Pulse 76   Ht  6' 2 (1.88 m)   Wt 181 lb (82.1 kg)   SpO2 96%   BMI 23.24 kg/m    Wt Readings from Last 3 Encounters:  03/23/24 181 lb (82.1 kg)  03/21/24 178 lb (80.7 kg)  02/02/24 170 lb 14.4 oz (77.5 kg)    GEN: Well nourished, well developed in no acute distress NECK: No JVD; No carotid bruits CARDIAC: RRR, no murmurs, rubs, gallops RESPIRATORY:  Clear to auscultation without rales, wheezing or rhonchi  ABDOMEN: Soft, non-tender, non-distended EXTREMITIES:  No edema; No acute deformity   Asessement and Plan:.    1. Orthostatic hypotension - has been a recurrent issue for the patient, suspect underlying autonomic neuropathy. Cardiac MRI did not show any evidence of infiltrative disease. Midodrine  restarted last OV at 2.5mg  TID. He remains asymptomatic though with continued evidence for drop in BP upon standing. Given prior hospitalization for such, would recommend to increase midodrine  to 5mg  3x/day to allow for a cushion to avoid symptomatic hypotension. He had a spuriously elevated BP at pulmonology a few days ago - discussed getting a BP cuff for home use and sending me copy of readings after 4-5 days of values. Need to balance hypo- and hypertension together.  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 He is completely asymptomatic from cardiac standpoint otherwise and has no clinical symptoms of heart failure on exam. Had discussed with Dr. Elmira who agrees conservative, symptom-based approach is appropriate, deferring invasive procedures for further evaluation at this time. Would not add GDMT at this time given orthostasis. Counseled on s/sx of CHF.  3. CAD - no accelerating angina. Continue ASA 81mg  daily, rosuvastatin  40mg  daily - CrCl OK for this. Note hx of rhabdo in 2022, with repeat CK in 2024 normal while back on statin. No myalgias.  4. PAD - by ABIs, also distal aortic arch ulcer  - no LE claudication present. Aortic arch ulcer is followed by Dr. Lucas, last OV 05/2023 with f/u planned 1 year.   5. Moderate pHTN - no s/sx of HF on exam. Follow clinically. May be related to ILD.  6. Mild bilateral carotid disease - last assessed 2022, will defer additional follow-up unless recurrent symptoms arise. Continue statin therapy.    Disposition: F/u with me or Dr. Elmira in 2-3 months with orthostatics to reassess response to midodrine  increase.  Signed, Naod Sweetland N Braydon Kullman, PA-C      [1]  Current Meds  Medication Sig   acetaminophen  (TYLENOL ) 500 MG tablet Take 500-1,000 mg by mouth 2 (two) times daily as needed for headache or moderate pain (pain score 4-6).   aspirin  EC 81 MG tablet Take 1 tablet (81 mg total) by mouth daily. Swallow whole.   Calcium  Carb-Cholecalciferol  (CALCIUM  + VITAMIN D3 PO) Take 1 tablet by mouth daily.   Continuous Glucose Sensor (FREESTYLE LIBRE 3 SENSOR) MISC Change sensor topically every 14 days to monitor glucose continously.   Cyanocobalamin  (VITAMIN B-12 PO) Take 1 tablet by  mouth daily.   Dulaglutide  (TRULICITY ) 3 MG/0.5ML SOAJ Inject 3 mg into the skin once a week. (Patient taking differently: Inject 3 mg into the skin every Wednesday.)   ferrous sulfate  325 (65 FE) MG tablet Take 1 tablet (325 mg total) by mouth daily with breakfast.   gabapentin  (NEURONTIN ) 100 MG capsule Take 1 capsule (100 mg total) by mouth at bedtime. (Patient taking differently: Take 100 mg by mouth at bedtime as needed (neuropathy).)   metFORMIN  (GLUCOPHAGE ) 1000 MG tablet Take 1 tablet (1,000 mg total) by mouth 2 (two) times daily with a meal.   midodrine  (PROAMATINE ) 2.5 MG tablet Take 1 tablet (2.5 mg total) by mouth 3 (three) times daily with meals.   Multiple Vitamins-Minerals (MENS 50+ MULTI VITAMIN/MIN) TABS Take 1 tablet by mouth daily.   nitroGLYCERIN  (NITROSTAT ) 0.4 MG SL tablet Place 1 tablet (0.4 mg total) under the tongue every 5 (five) minutes as needed  for chest pain.   pantoprazole  (PROTONIX ) 40 MG tablet Take 1 tablet (40 mg total) by mouth daily. (Patient taking differently: Take 40 mg by mouth daily as needed (heartburn).)   rOPINIRole  (REQUIP ) 2 MG tablet Take 2 tablets (4 mg total) by mouth at bedtime.   rosuvastatin  (CRESTOR ) 40 MG tablet Take 1 tablet (40 mg total) by mouth daily.   sertraline (ZOLOFT) 100 MG tablet Take 100 mg by mouth daily.   TRESIBA FLEXTOUCH 100 UNIT/ML FlexTouch Pen Inject 12 Units into the skin daily. (Patient taking differently: Inject 15 Units into the skin daily.)   [DISCONTINUED] DULoxetine  (CYMBALTA ) 30 MG capsule Take 1 capsule (30 mg total) by mouth daily.   "

## 2024-03-21 NOTE — Progress Notes (Signed)
 "              Harry Zamora    [id number]    [birth year]  Primary Care Physician:South, Garnette, MD  Referring Physician: Nichole Garnette, MD [address] [place],  [place] [address]  Chief complaint: Follow up for interstitial lung disease  HPI: 84 y.o.  with history of hypertension, diabetes, hyperlipidemia, prostate cancer in 2003 and he was hospitalized in December 2022 with severe sepsis secondary to community acquired pneumonia and RSV infection. Treated with ceftriaxone , azithromycin . Follow-up CTA in January showed persistent bilateral consolidation. CT also revealed an ulcer in the aorta and he has been evaluated by cardiothoracic surgery and has no indication for surgery at this point.  Overall he is feeling well with no dyspnea or cough aspirin  production.  Interval history:  Discussed the use of AI scribe software for clinical note transcription with the patient, who gave verbal consent to proceed.  History of Present Illness Harry Zamora is an 84 year old male with mild lung scarring who presents for follow-up of his pulmonary condition.  Pulmonary fibrosis - Mild lung scarring first identified in 2021 following severe pneumonia. - CT chest in August 2025 showed stable scarring without progression. - Breathing is currently stable without dyspnea.  Cough - Frequent dry, nonproductive cough. - Cough is not bothersome and not associated with throat irritation or postnasal drainage.  Weight changes - Recent weight gain of 10 pounds after prior weight loss.   Relevant pulmonary history Pets: Dogs Occupation: Used to be in [employer]. 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 No further pillars or comforters. Smoking history: smoke cigars occasionally. No cigarette use Travel history: originally from [place] . No recent travel Relevant family history: no family history of lung disease  Outpatient Encounter Medications as of  03/21/2024  Medication Sig   acetaminophen  (TYLENOL ) 500 MG tablet Take 500-1,000 mg by mouth 2 (two) times daily as needed for headache or moderate pain (pain score 4-6).   aspirin  EC 81 MG tablet Take 1 tablet (81 mg total) by mouth daily. Swallow whole.   Calcium  Carb-Cholecalciferol  (CALCIUM  + VITAMIN D3 PO) Take 1 tablet by mouth daily.   Continuous Glucose Sensor (FREESTYLE LIBRE 3 SENSOR) MISC Change sensor topically every 14 days to monitor glucose continously.   Cyanocobalamin  (VITAMIN B-12 PO) Take 1 tablet by mouth daily.   Dulaglutide  (TRULICITY ) 3 MG/0.5ML SOAJ Inject 3 mg into the skin once a week. (Patient taking differently: Inject 3 mg into the skin every Wednesday.)   DULoxetine  (CYMBALTA ) 30 MG capsule Take 1 capsule (30 mg total) by mouth daily.   ferrous sulfate  325 (65 FE) MG tablet Take 1 tablet (325 mg total) by mouth daily with breakfast.   gabapentin  (NEURONTIN ) 100 MG capsule Take 1 capsule (100 mg total) by mouth at bedtime. (Patient taking differently: Take 100 mg by mouth at bedtime as needed (neuropathy).)   metFORMIN  (GLUCOPHAGE ) 1000 MG tablet Take 1 tablet (1,000 mg total) by mouth 2 (two) times daily with a meal.   midodrine  (PROAMATINE ) 2.5 MG tablet Take 1 tablet (2.5 mg total) by mouth 3 (three) times daily with meals.   Multiple Vitamins-Minerals (MENS 50+ MULTI VITAMIN/MIN) TABS Take 1 tablet by mouth daily.   nitroGLYCERIN  (NITROSTAT ) 0.4 MG SL tablet Place 1 tablet (0.4 mg total) under the tongue every 5 (five) minutes as needed for chest pain.   pantoprazole  (PROTONIX ) 40 MG tablet Take 1  tablet (40 mg total) by mouth daily. (Patient taking differently: Take 40 mg by mouth daily as needed (heartburn).)   rOPINIRole  (REQUIP ) 2 MG tablet Take 2 tablets (4 mg total) by mouth at bedtime.   rosuvastatin  (CRESTOR ) 40 MG tablet Take 1 tablet (40 mg total) by mouth daily.   TRESIBA FLEXTOUCH 100 UNIT/ML FlexTouch Pen Inject 12 Units into the skin daily. (Patient  taking differently: Inject 15 Units into the skin daily.)   No facility-administered encounter medications on file as of 03/21/2024.   Vitals:   03/21/24 1110  BP: (!) 172/79  Pulse: 68  Temp: (!) 97 F (36.1 C)  Height: 6' 2 (1.88 m)  Weight: 178 lb (80.7 kg)  SpO2: 98%  TempSrc: Oral  BMI (Calculated): 22.84     Physical Exam GEN: No acute distress. CV: Regular rate and rhythm, no murmurs. LUNGS: Clear to auscultation bilaterally, normal respiratory effort. SKIN JOINTS: Warm and dry, no rash.    Data Reviewed: Imaging: CT abdomen pelvis 08/15/19 - visualized lung bases are unclear CT angiogram 09/12/20 - no PE, minimal atelectasis at the base CT angiogram 03/18/21 - multifocal pneumonia involving right upper middle and lower lobes and left lower lobe High-resolution CT 09/02/2021-mild subpleural reticulation and groundglass opacities without honeycombing.  Probable UIP pattern CTA 10/22/2021-small bilateral calcified granuloma.  CT chest 10/15/2023-stable minimal scarring at the base. I have reviewed the images personally.  PFTs: 10/10/2021 FVC 3.87 [86%], FEV1 2.80 [88%], F/F 72, TLC 7.53 [98%], DLCO 22.27 [84%] Normal test  04/24/2022 FVC 3.51 [78%], FEV1 2.53 [79%], F/F72, TLC 6.96 [90%], DLCO 20.77 [78%] Minimal diffusion defect  Labs: ANA, rheumatoid facto 11/18/2022- negative Assessment & Plan Stable pulmonary fibrosis secondary to prior severe pneumonia Pulmonary fibrosis is well-managed with mild scarring at the lung base, likely residual from severe pneumonia in 2022. No progression on scans, with the last scan in August 2025 showing stability. Differential diagnosis includes interstitial lung disease, but no progression suggests stability. Dry cough present, possibly due to sinus congestion, post-nasal drip, or acid reflux, but not concerning given stable lung changes. - Will schedule follow-up CT scan in January 2027. - Monitor for any changes in symptoms and report if  he occurs.   Plan/Recommendations: Follow-up CT scan in 1 year  Lonna Coder MD [facility] 03/21/2024, 11:17 AM  CC: Nichole Senior, MD    "

## 2024-03-21 NOTE — Patient Instructions (Signed)
" °  VISIT SUMMARY: You came in for a follow-up on your pulmonary condition. Your lung scarring remains stable, and your breathing is steady without any shortness of breath. We also discussed your recent weight gain and frequent dry cough.  YOUR PLAN: PULMONARY FIBROSIS: You have mild lung scarring from a severe pneumonia in 2022. Recent scans show that the scarring has not progressed. -We will schedule a follow-up CT scan in January 2027. -Please monitor for any changes in your symptoms and report them if they occur.  COUGH: You have a frequent dry cough that is not bothersome and not associated with throat irritation or postnasal drainage. -The cough may be due to sinus congestion, post-nasal drip, or acid reflux, but it is not concerning given your stable lung condition.  WEIGHT CHANGES: You have recently gained 10 pounds after previously losing weight. -No specific action needed at this time, but continue to monitor your weight.                      Contains text generated by Abridge.                                 Contains text generated by Abridge.   "

## 2024-03-23 ENCOUNTER — Encounter: Payer: Self-pay | Admitting: Physician Assistant

## 2024-03-23 ENCOUNTER — Ambulatory Visit: Attending: Physician Assistant | Admitting: Physician Assistant

## 2024-03-23 VITALS — BP 118/58 | HR 76 | Ht 74.0 in | Wt 181.0 lb

## 2024-03-23 DIAGNOSIS — I5022 Chronic systolic (congestive) heart failure: Secondary | ICD-10-CM | POA: Diagnosis not present

## 2024-03-23 DIAGNOSIS — I951 Orthostatic hypotension: Secondary | ICD-10-CM | POA: Diagnosis not present

## 2024-03-23 DIAGNOSIS — I272 Pulmonary hypertension, unspecified: Secondary | ICD-10-CM

## 2024-03-23 DIAGNOSIS — I6523 Occlusion and stenosis of bilateral carotid arteries: Secondary | ICD-10-CM | POA: Diagnosis not present

## 2024-03-23 DIAGNOSIS — I2510 Atherosclerotic heart disease of native coronary artery without angina pectoris: Secondary | ICD-10-CM

## 2024-03-23 DIAGNOSIS — I739 Peripheral vascular disease, unspecified: Secondary | ICD-10-CM | POA: Diagnosis not present

## 2024-03-23 MED ORDER — MIDODRINE HCL 5 MG PO TABS: 5.0000 mg | ORAL_TABLET | Freq: Three times a day (TID) | ORAL | 3 refills | Status: AC

## 2024-03-23 NOTE — Patient Instructions (Signed)
 Medication Instructions:  Increase Midodrine  5 mg take one tablet three times daily *If you need a refill on your cardiac medications before your next appointment, please call your pharmacy*   Follow-Up: At [facility], you and your health needs are our priority.  As part of our continuing mission to provide you with exceptional heart care, our providers are all part of one team.  This team includes your primary Cardiologist (physician) and Advanced Practice Providers or APPs (Physician Assistants and Nurse Practitioners) who all work together to provide you with the care you need, when you need it.  Your next appointment:   2-3 month(s)  Provider:   Newman JINNY Lawrence, MD            We need to get a better idea of what your blood pressure is running at home. Here are some instructions to follow: - I would recommend using a blood pressure cuff that goes on your arm. The wrist ones can be inaccurate. If you're purchasing one for the first time, try to select one that also reports your heart rate because this can be helpful information as well. - To check your blood pressure, choose a time at least 3 hours after taking your blood pressure medicines. If you can sample it at different times of the day, that's great - it might give you more information about how your blood pressure fluctuates. Remain seated in a chair for 5 minutes quietly beforehand, then check it.  - Please record a list of those readings and call us /send in MyChart message with them for our review after 4-5 days of readings.  For patients with history of weakened heart muscles, please monitor for signs of fluid retention - Shortness of breath, with or without a dry hacking cough  - Swelling in the hands, feet or stomach  - If you have to sleep on extra pillows at night in order to breathe

## 2024-05-05 ENCOUNTER — Ambulatory Visit: Admitting: Physical Medicine and Rehabilitation
